# Patient Record
Sex: Male | Born: 1940 | Race: White | Hispanic: Yes | Marital: Single | Smoking: Former smoker
Health system: Southern US, Community
[De-identification: ages and names within clinical notes are randomized; demographics above are authoritative.]

## PROBLEM LIST (undated history)

## (undated) DIAGNOSIS — M1990 Unspecified osteoarthritis, unspecified site: Secondary | ICD-10-CM

## (undated) DIAGNOSIS — I712 Thoracic aortic aneurysm, without rupture: Secondary | ICD-10-CM

## (undated) DIAGNOSIS — I7121 Aneurysm of the ascending aorta, without rupture: Secondary | ICD-10-CM

## (undated) DIAGNOSIS — Z8719 Personal history of other diseases of the digestive system: Secondary | ICD-10-CM

## (undated) DIAGNOSIS — Z950 Presence of cardiac pacemaker: Secondary | ICD-10-CM

## (undated) DIAGNOSIS — I739 Peripheral vascular disease, unspecified: Secondary | ICD-10-CM

## (undated) DIAGNOSIS — I10 Essential (primary) hypertension: Secondary | ICD-10-CM

## (undated) DIAGNOSIS — I152 Hypertension secondary to endocrine disorders: Secondary | ICD-10-CM

## (undated) DIAGNOSIS — E1169 Type 2 diabetes mellitus with other specified complication: Secondary | ICD-10-CM

## (undated) DIAGNOSIS — I2541 Coronary artery aneurysm: Secondary | ICD-10-CM

## (undated) DIAGNOSIS — E119 Type 2 diabetes mellitus without complications: Secondary | ICD-10-CM

## (undated) DIAGNOSIS — H9190 Unspecified hearing loss, unspecified ear: Secondary | ICD-10-CM

## (undated) DIAGNOSIS — Z952 Presence of prosthetic heart valve: Secondary | ICD-10-CM

## (undated) DIAGNOSIS — I2510 Atherosclerotic heart disease of native coronary artery without angina pectoris: Secondary | ICD-10-CM

## (undated) DIAGNOSIS — E1159 Type 2 diabetes mellitus with other circulatory complications: Secondary | ICD-10-CM

## (undated) HISTORY — PX: STENT PLACE LEFT URETER (ARMC HX): HXRAD1254

## (undated) HISTORY — DX: Coronary artery aneurysm: I25.41

## (undated) HISTORY — PX: PACEMAKER GENERATOR CHANGE: SHX5998

## (undated) HISTORY — PX: OTHER SURGICAL HISTORY: SHX169

## (undated) HISTORY — PX: COLONOSCOPY: SHX174

## (undated) HISTORY — PX: REPLACEMENT TOTAL KNEE BILATERAL: SUR1225

## (undated) HISTORY — PX: GASTRIC BYPASS: SHX52

## (undated) HISTORY — PX: DG CHOLECYSTOGRAPHY GALL BLADDER (ARMC HX): HXRAD1480

## (undated) HISTORY — PX: CATARACT EXTRACTION: SUR2

---
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
## 2009-01-29 DIAGNOSIS — F32A Depression, unspecified: Secondary | ICD-10-CM | POA: Insufficient documentation

## 2009-01-29 DIAGNOSIS — F319 Bipolar disorder, unspecified: Secondary | ICD-10-CM | POA: Insufficient documentation

## 2009-01-29 DIAGNOSIS — I2510 Atherosclerotic heart disease of native coronary artery without angina pectoris: Secondary | ICD-10-CM | POA: Diagnosis present

## 2009-01-29 DIAGNOSIS — H9190 Unspecified hearing loss, unspecified ear: Secondary | ICD-10-CM | POA: Insufficient documentation

## 2011-01-14 DIAGNOSIS — Z9861 Coronary angioplasty status: Secondary | ICD-10-CM | POA: Insufficient documentation

## 2013-06-09 DIAGNOSIS — M1710 Unilateral primary osteoarthritis, unspecified knee: Secondary | ICD-10-CM | POA: Insufficient documentation

## 2013-06-09 DIAGNOSIS — M179 Osteoarthritis of knee, unspecified: Secondary | ICD-10-CM | POA: Insufficient documentation

## 2014-01-18 DIAGNOSIS — R238 Other skin changes: Secondary | ICD-10-CM | POA: Insufficient documentation

## 2014-01-18 DIAGNOSIS — L0390 Cellulitis, unspecified: Secondary | ICD-10-CM | POA: Insufficient documentation

## 2014-03-22 DIAGNOSIS — G4700 Insomnia, unspecified: Secondary | ICD-10-CM | POA: Insufficient documentation

## 2014-03-23 DIAGNOSIS — K289 Gastrojejunal ulcer, unspecified as acute or chronic, without hemorrhage or perforation: Secondary | ICD-10-CM | POA: Insufficient documentation

## 2016-12-25 DIAGNOSIS — I4510 Unspecified right bundle-branch block: Secondary | ICD-10-CM | POA: Insufficient documentation

## 2017-09-21 DIAGNOSIS — K8000 Calculus of gallbladder with acute cholecystitis without obstruction: Secondary | ICD-10-CM | POA: Insufficient documentation

## 2018-04-25 DIAGNOSIS — I459 Conduction disorder, unspecified: Secondary | ICD-10-CM | POA: Insufficient documentation

## 2018-05-30 DIAGNOSIS — G4733 Obstructive sleep apnea (adult) (pediatric): Secondary | ICD-10-CM | POA: Insufficient documentation

## 2018-05-30 DIAGNOSIS — Z952 Presence of prosthetic heart valve: Secondary | ICD-10-CM | POA: Insufficient documentation

## 2018-08-24 DIAGNOSIS — R42 Dizziness and giddiness: Secondary | ICD-10-CM | POA: Insufficient documentation

## 2018-08-24 DIAGNOSIS — H9313 Tinnitus, bilateral: Secondary | ICD-10-CM | POA: Insufficient documentation

## 2018-08-24 DIAGNOSIS — J343 Hypertrophy of nasal turbinates: Secondary | ICD-10-CM | POA: Insufficient documentation

## 2018-08-24 DIAGNOSIS — J351 Hypertrophy of tonsils: Secondary | ICD-10-CM | POA: Insufficient documentation

## 2018-09-07 DIAGNOSIS — I350 Nonrheumatic aortic (valve) stenosis: Secondary | ICD-10-CM | POA: Insufficient documentation

## 2019-01-12 DIAGNOSIS — H9192 Unspecified hearing loss, left ear: Secondary | ICD-10-CM | POA: Insufficient documentation

## 2019-01-12 DIAGNOSIS — IMO0001 Reserved for inherently not codable concepts without codable children: Secondary | ICD-10-CM | POA: Insufficient documentation

## 2019-04-07 DIAGNOSIS — H3500 Unspecified background retinopathy: Secondary | ICD-10-CM | POA: Insufficient documentation

## 2019-04-07 DIAGNOSIS — E669 Obesity, unspecified: Secondary | ICD-10-CM | POA: Insufficient documentation

## 2019-12-09 DIAGNOSIS — R339 Retention of urine, unspecified: Secondary | ICD-10-CM | POA: Insufficient documentation

## 2019-12-09 DIAGNOSIS — M545 Low back pain, unspecified: Secondary | ICD-10-CM | POA: Insufficient documentation

## 2019-12-09 DIAGNOSIS — M940 Chondrocostal junction syndrome [Tietze]: Secondary | ICD-10-CM | POA: Insufficient documentation

## 2019-12-09 DIAGNOSIS — G8929 Other chronic pain: Secondary | ICD-10-CM | POA: Insufficient documentation

## 2019-12-10 DIAGNOSIS — M5136 Other intervertebral disc degeneration, lumbar region: Secondary | ICD-10-CM | POA: Insufficient documentation

## 2020-01-25 DIAGNOSIS — S72114A Nondisplaced fracture of greater trochanter of right femur, initial encounter for closed fracture: Secondary | ICD-10-CM | POA: Insufficient documentation

## 2020-09-06 DIAGNOSIS — I350 Nonrheumatic aortic (valve) stenosis: Secondary | ICD-10-CM

## 2020-09-17 ENCOUNTER — Telehealth: Payer: Self-pay | Admitting: Cardiology

## 2020-09-17 NOTE — Telephone Encounter (Signed)
Patient saw Dr. Agustin Cree in [facility] and is scheduled 11/19/2020.

## 2020-10-07 ENCOUNTER — Encounter: Payer: Self-pay | Admitting: *Deleted

## 2020-10-07 ENCOUNTER — Emergency Department
Admission: EM | Admit: 2020-10-07 | Discharge: 2020-10-07 | Disposition: A | Discharge status: Home / Self Care | Payer: Medicare (Managed Care) | Attending: Student in an Organized Health Care Education/Training Program | Admitting: Student in an Organized Health Care Education/Training Program

## 2020-10-07 ENCOUNTER — Emergency Department: Payer: Medicare (Managed Care)

## 2020-10-07 ENCOUNTER — Other Ambulatory Visit: Payer: Self-pay

## 2020-10-07 DIAGNOSIS — S3992XA Unspecified injury of lower back, initial encounter: Secondary | ICD-10-CM | POA: Diagnosis present

## 2020-10-07 DIAGNOSIS — W108XXA Fall (on) (from) other stairs and steps, initial encounter: Secondary | ICD-10-CM | POA: Insufficient documentation

## 2020-10-07 DIAGNOSIS — S39012A Strain of muscle, fascia and tendon of lower back, initial encounter: Secondary | ICD-10-CM | POA: Insufficient documentation

## 2020-10-07 DIAGNOSIS — M5136 Other intervertebral disc degeneration, lumbar region: Secondary | ICD-10-CM | POA: Diagnosis not present

## 2020-10-07 DIAGNOSIS — Z87891 Personal history of nicotine dependence: Secondary | ICD-10-CM | POA: Insufficient documentation

## 2020-10-07 DIAGNOSIS — M5134 Other intervertebral disc degeneration, thoracic region: Secondary | ICD-10-CM | POA: Insufficient documentation

## 2020-10-07 MED ORDER — TRAMADOL HCL 50 MG PO TABS
50.0000 mg | ORAL_TABLET | Freq: Once | ORAL | Status: AC
  Administered 2020-10-07: 50 mg via ORAL
  Filled 2020-10-07: qty 1

## 2020-10-07 MED ORDER — METHOCARBAMOL 500 MG PO TABS
500.0000 mg | ORAL_TABLET | Freq: Once | ORAL | Status: AC
  Administered 2020-10-07: 500 mg via ORAL
  Filled 2020-10-07: qty 1

## 2020-10-07 MED ORDER — PREDNISONE 20 MG PO TABS
60.0000 mg | ORAL_TABLET | Freq: Once | ORAL | Status: AC
  Administered 2020-10-07: 60 mg via ORAL
  Filled 2020-10-07: qty 3

## 2020-10-07 MED ORDER — METHOCARBAMOL 500 MG PO TABS: 500.0000 mg | ORAL_TABLET | Freq: Four times a day (QID) | ORAL | 0 refills | Status: DC

## 2020-10-07 MED ORDER — PREDNISONE 50 MG PO TABS: 50.0000 mg | ORAL_TABLET | Freq: Every day | ORAL | 0 refills | Status: DC

## 2020-10-07 MED ORDER — TRAMADOL HCL 50 MG PO TABS: 50.0000 mg | ORAL_TABLET | Freq: Four times a day (QID) | ORAL | 0 refills | Status: DC | PRN

## 2020-10-07 NOTE — ED Triage Notes (Signed)
Pt to triage via walker.  Pt fell 2 weeks ago down steps and has back pain.  Pt was seen in an er and continues to have back pain.  Pt requesting pain meds.   Pt just got insurance and wants to be checked again.  Pt alert  Speech clear.

## 2020-10-07 NOTE — ED Notes (Signed)
This RN inquired about patient having a ride home after discharge after pain medication administration. Patient states he will not be driving home upon discharge. Will have someone else drive him home.

## 2020-10-07 NOTE — ED Notes (Signed)
See triage note. Pt to room in wheelchair. Pt stating he fell x2 wks ago and has been having uncontrolled back pain. Pt seen in another ER but was not prescribed any pain medication. Pt states he takes pain medicine at home but wasn't sure the name

## 2020-10-07 NOTE — ED Provider Notes (Signed)
[facility] Emergency Department Provider Note  ____________________________________________  Time seen: Approximately 7:02 PM  I have reviewed the triage vital signs and the nursing notes.   HISTORY  Chief Complaint Back Pain    HPI Nathan Singleton is a 79 y.o. male who presents the emergency department complaining of mid and lower back pain.  Patient states that he had a fall several weeks ago and injured his back.  Patient is from [place], recently moved to the area.  He states that he was evaluated at emergency department in [place].  Patient states that he had reassuring imaging at the time of injury but with his ongoing pain he wants to be reassessed.  He has had no bowel or bladder dysfunction, saddle anesthesia or paresthesias.  No radicular symptoms.  Just ongoing mid and lower back pain.  Patient denies any other complaints at this time.         No past medical history on file.  There are no problems to display for this patient.     Prior to Admission medications   Medication Sig Start Date End Date Taking? Authorizing Provider  methocarbamol (ROBAXIN) 500 MG tablet Take 1 tablet (500 mg total) by mouth 4 (four) times daily. 10/07/20   Alarik Radu, Charline Bills, PA-C  predniSONE (DELTASONE) 50 MG tablet Take 1 tablet (50 mg total) by mouth daily with breakfast. 10/07/20   Gabryella Murfin, Charline Bills, PA-C  traMADol (ULTRAM) 50 MG tablet Take 1 tablet (50 mg total) by mouth every 6 (six) hours as needed. 10/07/20   Novalie Leamy, Charline Bills, PA-C    Allergies Patient has no allergy information on record.  No family history on file.  Social History Social History   Tobacco Use  . Smoking status: Former Research scientist (life sciences)  . Smokeless tobacco: Never Used  Substance Use Topics  . Alcohol use: Not Currently  . Drug use: Not Currently     Review of Systems  Constitutional: No fever/chills Eyes: No visual changes. No discharge ENT: No upper respiratory  complaints. Cardiovascular: no chest pain. Respiratory: no cough. No SOB. Gastrointestinal: No abdominal pain.  No nausea, no vomiting.  No diarrhea.  No constipation. Genitourinary: Negative for dysuria. No hematuria Musculoskeletal: Positive for mid and lower back pain Skin: Negative for rash, abrasions, lacerations, ecchymosis. Neurological: Negative for headaches, focal weakness or numbness.  10 System ROS otherwise negative.  ____________________________________________   PHYSICAL EXAM:  VITAL SIGNS: ED Triage Vitals  Enc Vitals Group     BP 10/07/20 1641 (!) 146/56     Pulse Rate 10/07/20 1641 63     Resp 10/07/20 1641 18     Temp 10/07/20 1641 99.2 F (37.3 C)     Temp Source 10/07/20 1641 Oral     SpO2 10/07/20 1641 99 %     Weight 10/07/20 1638 250 lb (113.4 kg)     Height 10/07/20 1638 5\' 11"  (1.803 m)     Head Circumference --      Peak Flow --      Pain Score 10/07/20 1638 8     Pain Loc --      Pain Edu? --      Excl. in [place]? --      Constitutional: Alert and oriented. Well appearing and in no acute distress. Eyes: Conjunctivae are normal. PERRL. EOMI. Head: Atraumatic. ENT:      Ears:       Nose: No congestion/rhinnorhea.      Mouth/Throat: Mucous membranes  are moist.  Neck: No stridor.  No cervical spine tenderness to palpation.  Cardiovascular: Normal rate, regular rhythm. Normal S1 and S2.  Good peripheral circulation. Respiratory: Normal respiratory effort without tachypnea or retractions. Lungs CTAB. Good air entry to the bases with no decreased or absent breath sounds. Gastrointestinal: Bowel sounds 4 quadrants. Soft and nontender to palpation. No guarding or rigidity. No palpable masses. No distention. No CVA tenderness. Musculoskeletal: Full range of motion to all extremities. No gross deformities appreciated.  Visualization of the thoracic and lumbar spine reveals no visible signs of trauma.  Patient is mildly tender to palpation over the lower  thoracic and upper lumbar spines in the region of T10-L2.  No palpable abnormality or step-off.  Patient is also having associated paraspinal muscle tenderness in this region.  This paraspinal muscle tenderness extends from the mid thoracic through the lower lumbar region.  Palpable spasm on the left side is appreciated.  No spasm on the right.  No extension of tenderness into the SI joint or sciatic notch.  Negative straight leg raise bilaterally.  Patient is ambulating without difficulty.  Dorsalis pedis pulse and sensation intact distally. Neurologic:  Normal speech and language. No gross focal neurologic deficits are appreciated.  Skin:  Skin is warm, dry and intact. No rash noted. Psychiatric: Mood and affect are normal. Speech and behavior are normal. Patient exhibits appropriate insight and judgement.   ____________________________________________   LABS (all labs ordered are listed, but only abnormal results are displayed)  Labs Reviewed - No data to display ____________________________________________  EKG   ____________________________________________  RADIOLOGY I personally viewed and evaluated these images as part of my medical decision making, as well as reviewing the written report by the radiologist.  ED Provider Interpretation: Visualization of thoracic and lumbar films reveals severe degenerative disc disease throughout the thoracic and lumbar spine.  No evidence of compression fracture.  No previous imaging for comparison.  DG Thoracic Spine 2 View  Result Date: 10/07/2020 CLINICAL DATA:  Status post fall. EXAM: THORACIC SPINE 2 VIEWS COMPARISON:  None. FINDINGS: There is no evidence of an acute thoracic spine fracture. Alignment is normal. There is moderate severity multilevel endplate sclerosis with mild to moderate severity multilevel intervertebral disc space narrowing. No other significant bone abnormalities are identified. 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 IMPRESSION: 1. Moderate severity multilevel degenerative disc disease. Electronically Signed   By: Virgina Norfolk M.D.   On: 10/07/2020 19:02   DG Lumbar Spine 2-3 Views  Result Date: 10/07/2020 CLINICAL DATA:  Status post recent fall. EXAM: LUMBAR SPINE - 2-3 VIEW COMPARISON:  September 06, 2020 FINDINGS: There is no evidence of an acute lumbar spine fracture. Alignment is normal. There is moderate to marked severity endplate sclerosis and anterior osteophyte formation. Mild multilevel intervertebral disc space narrowing is seen. Radiopaque surgical clips are seen within the bilateral upper quadrants. IMPRESSION: 1. Moderate to marked severity multilevel degenerative disc disease. Electronically Signed   By: Virgina Norfolk M.D.   On: 10/07/2020 19:04    ____________________________________________    PROCEDURES  Procedure(s) performed:    Procedures    Medications  traMADol (ULTRAM) tablet 50 mg (50 mg Oral Given 10/07/20 1953)  predniSONE (DELTASONE) tablet 60 mg (60 mg Oral Given 10/07/20 1952)  methocarbamol (ROBAXIN) tablet 500 mg (500 mg Oral Given 10/07/20 1953)     ____________________________________________   INITIAL IMPRESSION / ASSESSMENT AND PLAN / ED COURSE  Pertinent labs & imaging results that were available during my care of the patient were reviewed by me and considered in my medical decision making (see chart for details).  Review of the [place] CSRS was performed in accordance of the [place] prior to dispensing any controlled drugs.           Patient's diagnosis is consistent with lumbar strain, degenerative disc disease of the lumbar and thoracic spine.  Patient had a fall several weeks ago and has had ongoing back pain.  Patient been evaluated in the emergency department initially in [place].  Unable to identify any records in epic.  There was no previous imaging for comparison.  Patient had  ongoing pain.  Diffuse tenderness in the thoracic and lumbar region with appreciable muscle spasms on exam.  No concerning neuro deficits on exam.  Differential included compression fracture, ruptured disc, contusion, strain.  Imaging was reassuring with no acute findings concerning for compression fractures.  Diffuse degenerative disc disease.  At this time no indication for further imaging with CT or MRI.Marland Kitchen  Patient's fianc states that the patient had been having back issues even before this fall.  He had seen neurosurgery regularly in [place] for epidural steroid injections.  I will refer the patient to neurosurgery for ongoing management of chronic issues.  Return precautions discussed with the patient and the fianc.  Patient is given ED precautions to return to the ED for any worsening or new symptoms.     ____________________________________________  FINAL CLINICAL IMPRESSION(S) / ED DIAGNOSES  Final diagnoses:  Strain of lumbar region, initial encounter  Degenerative disc disease, lumbar  DDD (degenerative disc disease), thoracic      NEW MEDICATIONS STARTED DURING THIS VISIT:  ED Discharge Orders         Ordered    methocarbamol (ROBAXIN) 500 MG tablet  4 times daily        10/07/20 1950    predniSONE (DELTASONE) 50 MG tablet  Daily with breakfast        10/07/20 1950    traMADol (ULTRAM) 50 MG tablet  Every 6 hours PRN        10/07/20 1951              This chart was dictated using voice recognition software/Dragon. Despite best efforts to proofread, errors can occur which can change the meaning. Any change was purely unintentional.    Darletta Moll, PA-C 10/07/20 1956    Merlyn Lot, MD 10/07/20 2027

## 2020-10-13 ENCOUNTER — Emergency Department
Admission: EM | Admit: 2020-10-13 | Discharge: 2020-10-13 | Disposition: A | Discharge status: Home / Self Care | Payer: Medicare (Managed Care) | Attending: Emergency Medicine | Admitting: Emergency Medicine

## 2020-10-13 ENCOUNTER — Emergency Department: Payer: Medicare (Managed Care)

## 2020-10-13 DIAGNOSIS — Z87891 Personal history of nicotine dependence: Secondary | ICD-10-CM | POA: Diagnosis not present

## 2020-10-13 DIAGNOSIS — E1165 Type 2 diabetes mellitus with hyperglycemia: Secondary | ICD-10-CM | POA: Insufficient documentation

## 2020-10-13 DIAGNOSIS — R531 Weakness: Secondary | ICD-10-CM | POA: Diagnosis not present

## 2020-10-13 DIAGNOSIS — I10 Essential (primary) hypertension: Secondary | ICD-10-CM | POA: Insufficient documentation

## 2020-10-13 DIAGNOSIS — R739 Hyperglycemia, unspecified: Secondary | ICD-10-CM

## 2020-10-13 HISTORY — DX: Type 2 diabetes mellitus without complications: E11.9

## 2020-10-13 HISTORY — DX: Unspecified osteoarthritis, unspecified site: M19.90

## 2020-10-13 LAB — URINALYSIS, COMPLETE (UACMP) WITH MICROSCOPIC
Bacteria, UA: NONE SEEN
Bilirubin Urine: NEGATIVE
Glucose, UA: >=500 mg/dL — AB
Hgb urine dipstick: NEGATIVE
Ketones, ur: NEGATIVE mg/dL
Leukocytes,Ua: NEGATIVE
Nitrite: NEGATIVE
Protein, ur: NEGATIVE mg/dL
Specific Gravity, Urine: 1.007 (ref 1.005–1.030)
Squamous Epithelial / HPF: NONE SEEN (ref 0–5)
pH: 5 (ref 5.0–8.0)

## 2020-10-13 LAB — CBC WITH DIFFERENTIAL/PLATELET
Abs Immature Granulocytes: 0.01 10*3/uL (ref 0.00–0.07)
Basophils Absolute: 0 10*3/uL (ref 0.0–0.1)
Basophils Relative: 1 %
Eosinophils Absolute: 0.2 10*3/uL (ref 0.0–0.5)
Eosinophils Relative: 4 %
HCT: 33 % — ABNORMAL LOW (ref 39.0–52.0)
Hemoglobin: 11.3 g/dL — ABNORMAL LOW (ref 13.0–17.0)
Immature Granulocytes: 0 %
Lymphocytes Relative: 27 %
Lymphs Abs: 1.5 10*3/uL (ref 0.7–4.0)
MCH: 32.8 pg (ref 26.0–34.0)
MCHC: 34.2 g/dL (ref 30.0–36.0)
MCV: 95.7 fL (ref 80.0–100.0)
Monocytes Absolute: 0.3 10*3/uL (ref 0.1–1.0)
Monocytes Relative: 6 %
Neutro Abs: 3.4 10*3/uL (ref 1.7–7.7)
Neutrophils Relative %: 62 %
Platelets: 114 10*3/uL — ABNORMAL LOW (ref 150–400)
RBC: 3.45 MIL/uL — ABNORMAL LOW (ref 4.22–5.81)
RDW: 13.2 % (ref 11.5–15.5)
WBC: 5.5 10*3/uL (ref 4.0–10.5)
nRBC: 0 % (ref 0.0–0.2)

## 2020-10-13 LAB — COMPREHENSIVE METABOLIC PANEL
ALT: 46 U/L — ABNORMAL HIGH (ref 0–44)
AST: 36 U/L (ref 15–41)
Albumin: 4.3 g/dL (ref 3.5–5.0)
Alkaline Phosphatase: 125 U/L (ref 38–126)
Anion gap: 11 (ref 5–15)
BUN: 56 mg/dL — ABNORMAL HIGH (ref 8–23)
CO2: 23 mmol/L (ref 22–32)
Calcium: 8.5 mg/dL — ABNORMAL LOW (ref 8.9–10.3)
Chloride: 102 mmol/L (ref 98–111)
Creatinine, Ser: 1.62 mg/dL — ABNORMAL HIGH (ref 0.61–1.24)
GFR, Estimated: 43 mL/min — ABNORMAL LOW (ref >60)
Glucose, Bld: 334 mg/dL — ABNORMAL HIGH (ref 70–99)
Potassium: 4.4 mmol/L (ref 3.5–5.1)
Sodium: 136 mmol/L (ref 135–145)
Total Bilirubin: 0.8 mg/dL (ref 0.3–1.2)
Total Protein: 7.3 g/dL (ref 6.5–8.1)

## 2020-10-13 LAB — CBG MONITORING, ED
Glucose-Capillary: 328 mg/dL — ABNORMAL HIGH (ref 70–99)
Glucose-Capillary: 237 mg/dL — ABNORMAL HIGH (ref 70–99)

## 2020-10-13 MED ORDER — LACTATED RINGERS IV BOLUS
1000.0000 mL | Freq: Once | INTRAVENOUS | Status: AC
  Administered 2020-10-13: 1000 mL via INTRAVENOUS

## 2020-10-13 MED ORDER — LIDOCAINE 5 % EX PTCH: 1.0000 | MEDICATED_PATCH | Freq: Two times a day (BID) | CUTANEOUS | 0 refills | Status: DC

## 2020-10-13 NOTE — ED Provider Notes (Signed)
[facility] Emergency Department Provider Note   ____________________________________________   First MD Initiated Contact with Patient 10/13/20 1914     (approximate)  I have reviewed the triage vital signs and the nursing notes.   HISTORY  Chief Complaint Hyperglycemia and Weakness    HPI Nathan Singleton is a 79 y.o. male with past medical history of hypertension, diabetes, and arthritis who presents to the ED complaining of hyperglycemia and weakness.  Patient states he was recently seen in the ED for back pain following fall, found to have multilevel degenerative disease at that time with no acute injury.  He was started on prednisone for his back pain and since then his blood sugars have been running high.  He also states that he has been feeling weaker than usual with generalized malaise.  He denies any fevers, cough, chest pain, shortness of breath, abdominal pain, nausea, vomiting, dysuria, or hematuria.  He has had a couple days of diarrhea but denies any blood in his stool.  He does state that he was previously treated for an ulcer while in [place].        Past Medical History:  Diagnosis Date  . Arthritis   . Diabetes mellitus without complication ([place])   . Hypertension     There are no problems to display for this patient.   History reviewed. No pertinent surgical history.  Prior to Admission medications   Medication Sig Start Date End Date Taking? Authorizing Provider  lidocaine (LIDODERM) 5 % Place 1 patch onto the skin every 12 (twelve) hours. Remove & Discard patch within 12 hours or as directed by MD 10/13/20 10/13/21  Blake Divine, MD  methocarbamol (ROBAXIN) 500 MG tablet Take 1 tablet (500 mg total) by mouth 4 (four) times daily. 10/07/20   Cuthriell, Charline Bills, PA-C  predniSONE (DELTASONE) 50 MG tablet Take 1 tablet (50 mg total) by mouth daily with breakfast. 10/07/20   Cuthriell, Charline Bills, PA-C  traMADol (ULTRAM)  50 MG tablet Take 1 tablet (50 mg total) by mouth every 6 (six) hours as needed. 10/07/20   Cuthriell, Charline Bills, PA-C    Allergies Patient has no allergy information on record.  No family history on file.  Social History Social History   Tobacco Use  . Smoking status: Former Research scientist (life sciences)  . Smokeless tobacco: Never Used  Substance Use Topics  . Alcohol use: Not Currently  . Drug use: Not Currently    Review of Systems  Constitutional: No fever/chills.  Positive for generalized weakness and malaise. Eyes: No visual changes. ENT: No sore throat. Cardiovascular: Denies chest pain. Respiratory: Denies shortness of breath. Gastrointestinal: No abdominal pain.  No nausea, no vomiting.  No diarrhea.  No constipation. Genitourinary: Negative for dysuria. Musculoskeletal: Positive for back pain. Skin: Negative for rash. Neurological: Negative for headaches, focal weakness or numbness.  ____________________________________________   PHYSICAL EXAM:  VITAL SIGNS: ED Triage Vitals  Enc Vitals Group     BP      Pulse      Resp      Temp      Temp src      SpO2      Weight      Height      Head Circumference      Peak Flow      Pain Score      Pain Loc      Pain Edu?      Excl. in [place]?  Constitutional: Alert and oriented. Eyes: Conjunctivae are normal. Head: Atraumatic. Nose: No congestion/rhinnorhea. Mouth/Throat: Mucous membranes are dry. Neck: Normal ROM Cardiovascular: Normal rate, regular rhythm. Grossly normal heart sounds. Respiratory: Normal respiratory effort.  No retractions. Lungs CTAB. Gastrointestinal: Soft and nontender. No distention. Genitourinary: deferred Musculoskeletal: No lower extremity tenderness nor edema. Neurologic:  Normal speech and language. No gross focal neurologic deficits are appreciated. Skin:  Skin is warm, dry and intact. No rash noted. Psychiatric: Mood and affect are normal. Speech and behavior are  normal.  ____________________________________________   LABS (all labs ordered are listed, but only abnormal results are displayed)  Labs Reviewed  CBC WITH DIFFERENTIAL/PLATELET - Abnormal; Notable for the following components:      Result Value   RBC 3.45 (*)    Hemoglobin 11.3 (*)    HCT 33.0 (*)    Platelets 114 (*)    All other components within normal limits  COMPREHENSIVE METABOLIC PANEL - Abnormal; Notable for the following components:   Glucose, Bld 334 (*)    BUN 56 (*)    Creatinine, Ser 1.62 (*)    Calcium 8.5 (*)    ALT 46 (*)    GFR, Estimated 43 (*)    All other components within normal limits  URINALYSIS, COMPLETE (UACMP) WITH MICROSCOPIC - Abnormal; Notable for the following components:   Color, Urine STRAW (*)    APPearance CLEAR (*)    Glucose, UA >=500 (*)    All other components within normal limits  CBG MONITORING, ED - Abnormal; Notable for the following components:   Glucose-Capillary 328 (*)    All other components within normal limits   ____________________________________________  EKG  ED ECG REPORT I, Blake Divine, the attending physician, personally viewed and interpreted this ECG.   Date: 10/13/2020  EKG Time: 19:41  Rate: 60  Rhythm: normal sinus rhythm  Axis: Normal  Intervals:right bundle branch block  ST&T Change: None   PROCEDURES  Procedure(s) performed (including Critical Care):  Procedures   ____________________________________________   INITIAL IMPRESSION / ASSESSMENT AND PLAN / ED COURSE       79 year old male with past medical history of hypertension, diabetes, peptic ulcer disease, and arthritis who presents to the ED complaining of generalized weakness and malaise after starting on prednisone for back pain 6 days ago.  Blood glucose noted to be greater than 400 with EMS, we will hydrate patient with IV fluids and check electrolytes for DKA.  He is overall well-appearing with no focal complaints.  We will screen  chest x-ray and UA for evidence of infection, also check EKG.  Patient would likely benefit from stopping steroids for his back pain in favor of Lidoderm patch and Tylenol.  Chest x-ray reviewed by me and shows no infiltrate, edema, or effusion, UA also with no evidence of infection.  EKG without evidence of arrhythmia or ischemia.  Patient reports feeling better following IV fluids and he is appropriate for discharge home with PCP follow-up.  He states he has an appointment scheduled with a new PCP in 10 days, he was also advised to stop taking steroids.  He was counseled to return to the ED for new worsening symptoms, patient agrees with plan.      ____________________________________________   FINAL CLINICAL IMPRESSION(S) / ED DIAGNOSES  Final diagnoses:  Hyperglycemia     ED Discharge Orders         Ordered    lidocaine (LIDODERM) 5 %  Every 12 hours  10/13/20 2302           Note:  This document was prepared using Dragon voice recognition software and may include unintentional dictation errors.   Blake Divine, MD 10/13/20 [phone]

## 2020-10-13 NOTE — ED Triage Notes (Signed)
Pt endorsing weakness for a few days. Pt was recently seen for back pain and was given prednisone. Pt endorsing diarrhea for a few days.

## 2020-10-13 NOTE — ED Notes (Addendum)
Pt given a diet ginger ale 

## 2020-11-11 ENCOUNTER — Encounter: Payer: Self-pay | Admitting: Family Medicine

## 2020-11-15 ENCOUNTER — Other Ambulatory Visit: Payer: Self-pay

## 2020-11-15 ENCOUNTER — Emergency Department: Payer: Medicare Other

## 2020-11-15 DIAGNOSIS — I10 Essential (primary) hypertension: Secondary | ICD-10-CM | POA: Insufficient documentation

## 2020-11-15 DIAGNOSIS — I712 Thoracic aortic aneurysm, without rupture: Secondary | ICD-10-CM | POA: Insufficient documentation

## 2020-11-15 DIAGNOSIS — S2224XA Fracture of xiphoid process, initial encounter for closed fracture: Secondary | ICD-10-CM | POA: Insufficient documentation

## 2020-11-15 DIAGNOSIS — R072 Precordial pain: Secondary | ICD-10-CM | POA: Diagnosis present

## 2020-11-15 DIAGNOSIS — Y92009 Unspecified place in unspecified non-institutional (private) residence as the place of occurrence of the external cause: Secondary | ICD-10-CM | POA: Diagnosis not present

## 2020-11-15 DIAGNOSIS — S20219A Contusion of unspecified front wall of thorax, initial encounter: Secondary | ICD-10-CM | POA: Diagnosis not present

## 2020-11-15 DIAGNOSIS — E119 Type 2 diabetes mellitus without complications: Secondary | ICD-10-CM | POA: Diagnosis not present

## 2020-11-15 DIAGNOSIS — R590 Localized enlarged lymph nodes: Secondary | ICD-10-CM | POA: Diagnosis not present

## 2020-11-15 DIAGNOSIS — Z87891 Personal history of nicotine dependence: Secondary | ICD-10-CM | POA: Insufficient documentation

## 2020-11-15 DIAGNOSIS — W01118A Fall on same level from slipping, tripping and stumbling with subsequent striking against other sharp object, initial encounter: Secondary | ICD-10-CM | POA: Diagnosis not present

## 2020-11-15 LAB — BASIC METABOLIC PANEL
Anion gap: 13 (ref 5–15)
BUN: 61 mg/dL — ABNORMAL HIGH (ref 8–23)
CO2: 23 mmol/L (ref 22–32)
Calcium: 8.7 mg/dL — ABNORMAL LOW (ref 8.9–10.3)
Chloride: 103 mmol/L (ref 98–111)
Creatinine, Ser: 1.48 mg/dL — ABNORMAL HIGH (ref 0.61–1.24)
GFR, Estimated: 48 mL/min — ABNORMAL LOW (ref >60)
Glucose, Bld: 121 mg/dL — ABNORMAL HIGH (ref 70–99)
Potassium: 4.8 mmol/L (ref 3.5–5.1)
Sodium: 139 mmol/L (ref 135–145)

## 2020-11-15 LAB — CBC
HCT: 32.4 % — ABNORMAL LOW (ref 39.0–52.0)
Hemoglobin: 11 g/dL — ABNORMAL LOW (ref 13.0–17.0)
MCH: 32.5 pg (ref 26.0–34.0)
MCHC: 34 g/dL (ref 30.0–36.0)
MCV: 95.9 fL (ref 80.0–100.0)
Platelets: 106 10*3/uL — ABNORMAL LOW (ref 150–400)
RBC: 3.38 MIL/uL — ABNORMAL LOW (ref 4.22–5.81)
RDW: 13.2 % (ref 11.5–15.5)
WBC: 3.6 10*3/uL — ABNORMAL LOW (ref 4.0–10.5)
nRBC: 0 % (ref 0.0–0.2)

## 2020-11-15 LAB — TROPONIN I (HIGH SENSITIVITY): Troponin I (High Sensitivity): 13 ng/L (ref <18)

## 2020-11-15 NOTE — ED Notes (Signed)
Pt to ct scan.

## 2020-11-15 NOTE — ED Notes (Signed)
Per ems pt fell hit his sternum on a washing machine. Per ems pt with indentation to chest noted. Vitals stable, no shob, no ekg obtained by ems.

## 2020-11-15 NOTE — ED Triage Notes (Addendum)
Pt to ED via [facility] from home. Pt stating he sustained a fall and hit the corner of the dryer on his central chest region. CP is central and radiates to left side. Pt stating he heard a pop when he hit the dryer. Pt denies blood thinner use.

## 2020-11-16 ENCOUNTER — Emergency Department
Admission: EM | Admit: 2020-11-16 | Discharge: 2020-11-16 | Disposition: A | Discharge status: Home / Self Care | Payer: Medicare Other | Attending: Emergency Medicine | Admitting: Emergency Medicine

## 2020-11-16 DIAGNOSIS — I712 Thoracic aortic aneurysm, without rupture, unspecified: Secondary | ICD-10-CM

## 2020-11-16 DIAGNOSIS — S20219A Contusion of unspecified front wall of thorax, initial encounter: Secondary | ICD-10-CM

## 2020-11-16 DIAGNOSIS — R590 Localized enlarged lymph nodes: Secondary | ICD-10-CM

## 2020-11-16 DIAGNOSIS — S2224XA Fracture of xiphoid process, initial encounter for closed fracture: Secondary | ICD-10-CM

## 2020-11-16 LAB — TROPONIN I (HIGH SENSITIVITY): Troponin I (High Sensitivity): 13 ng/L (ref <18)

## 2020-11-16 MED ORDER — HYDROCODONE-ACETAMINOPHEN 5-325 MG PO TABS
1.0000 | ORAL_TABLET | Freq: Four times a day (QID) | ORAL | 0 refills | Status: DC | PRN
Start: 2020-11-16 — End: 2020-11-22

## 2020-11-16 MED ORDER — HYDROCODONE-ACETAMINOPHEN 5-325 MG PO TABS
1.0000 | ORAL_TABLET | Freq: Once | ORAL | Status: AC
  Administered 2020-11-16: 1 via ORAL
  Filled 2020-11-16: qty 1

## 2020-11-16 NOTE — ED Notes (Signed)
Patient attempting to call ride.

## 2020-11-16 NOTE — ED Provider Notes (Signed)
[facility] Emergency Department Provider Note   ____________________________________________   First MD Initiated Contact with Patient 11/16/20 0425     (approximate)  I have reviewed the triage vital signs and the nursing notes.   HISTORY  Chief Complaint Chest Pain and Fall    HPI Nathan Singleton is a 79 y.o. male brought to the ED via EMS from home status post mechanical fall with chest pain.  Patient tripped and fell and hit the corner of the dryer with his chest.  Reports substernal chest pain.  States he heard a "pop" when he hit the dryer.  Did not strike head or suffer LOC.  Denies anticoagulant use.  Denies cough, shortness of breath, abdominal pain, nausea, vomiting or dizziness.     Past Medical History:  Diagnosis Date  . Arthritis   . Diabetes mellitus without complication ([place])   . Hypertension     There are no problems to display for this patient.   No past surgical history on file.  Prior to Admission medications   Medication Sig Start Date End Date Taking? Authorizing Provider  HYDROcodone-acetaminophen (NORCO) 5-325 MG tablet Take 1 tablet by mouth every 6 (six) hours as needed for moderate pain. 11/16/20   Paulette Blanch, MD  lidocaine (LIDODERM) 5 % Place 1 patch onto the skin every 12 (twelve) hours. Remove & Discard patch within 12 hours or as directed by MD 10/13/20 10/13/21  Blake Divine, MD  methocarbamol (ROBAXIN) 500 MG tablet Take 1 tablet (500 mg total) by mouth 4 (four) times daily. 10/07/20   Cuthriell, Charline Bills, PA-C  predniSONE (DELTASONE) 50 MG tablet Take 1 tablet (50 mg total) by mouth daily with breakfast. 10/07/20   Cuthriell, Charline Bills, PA-C  traMADol (ULTRAM) 50 MG tablet Take 1 tablet (50 mg total) by mouth every 6 (six) hours as needed. 10/07/20   Cuthriell, Charline Bills, PA-C    Allergies Patient has no allergy information on record.  No family history on file.  Social History Social History    Tobacco Use  . Smoking status: Former Research scientist (life sciences)  . Smokeless tobacco: Never Used  Substance Use Topics  . Alcohol use: Not Currently  . Drug use: Not Currently    Review of Systems  Constitutional: No fever/chills Eyes: No visual changes. ENT: No sore throat. Cardiovascular: Positive for chest pain. Respiratory: Denies shortness of breath. Gastrointestinal: No abdominal pain.  No nausea, no vomiting.  No diarrhea.  No constipation. Genitourinary: Negative for dysuria. Musculoskeletal: Negative for back pain. Skin: Negative for rash. Neurological: Negative for headaches, focal weakness or numbness.   ____________________________________________   PHYSICAL EXAM:  VITAL SIGNS: ED Triage Vitals  Enc Vitals Group     BP 11/15/20 2010 (!) 155/45     Pulse Rate 11/15/20 2009 68     Resp 11/15/20 2009 20     Temp 11/15/20 2009 98.3 F (36.8 C)     Temp Source 11/15/20 2009 Oral     SpO2 11/15/20 2009 100 %     Weight 11/15/20 2006 249 lb 1.9 oz (113 kg)     Height 11/15/20 2006 5\' 11"  (1.803 m)     Head Circumference --      Peak Flow --      Pain Score 11/15/20 2006 8     Pain Loc --      Pain Edu? --      Excl. in [place]? --     Constitutional: Alert and oriented.  Well appearing and in no acute distress.  Hard of hearing. Eyes: Conjunctivae are normal. PERRL. EOMI. Head: Atraumatic. Nose: Atraumatic. Mouth/Throat: Mucous membranes are moist.  No dental malocclusion.   Neck: No stridor.  No cervical spine tenderness to palpation. Cardiovascular: Normal rate, regular rhythm. Grossly normal heart sounds.  Good peripheral circulation. Respiratory: Normal respiratory effort.  No retractions. Lungs CTAB.  No crepitus.  No splinting.  Point tender to palpation xiphoid process.  Small contusion to right anterior chest Gastrointestinal: Soft and nontender to light or deep palpation. No distention. No abdominal bruits. No CVA tenderness. Musculoskeletal: No lower extremity  tenderness nor edema.  No joint effusions. Neurologic:  Normal speech and language. No gross focal neurologic deficits are appreciated. No gait instability. Skin:  Skin is warm, dry and intact. No rash noted. Psychiatric: Mood and affect are normal. Speech and behavior are normal.  ____________________________________________   LABS (all labs ordered are listed, but only abnormal results are displayed)  Labs Reviewed  BASIC METABOLIC PANEL - Abnormal; Notable for the following components:      Result Value   Glucose, Bld 121 (*)    BUN 61 (*)    Creatinine, Ser 1.48 (*)    Calcium 8.7 (*)    GFR, Estimated 48 (*)    All other components within normal limits  CBC - Abnormal; Notable for the following components:   WBC 3.6 (*)    RBC 3.38 (*)    Hemoglobin 11.0 (*)    HCT 32.4 (*)    Platelets 106 (*)    All other components within normal limits  TROPONIN I (HIGH SENSITIVITY)  TROPONIN I (HIGH SENSITIVITY)   ____________________________________________  EKG  ED ECG REPORT I, Neo Yepiz J, the attending physician, personally viewed and interpreted this ECG.   Date: 11/16/2020  EKG Time: 2005  Rate: 62  Rhythm: normal EKG, normal sinus rhythm  Axis: Normal  Intervals:right bundle branch block  ST&T Change: Nonspecific  ____________________________________________  RADIOLOGY I, Javier Gell J, personally viewed and evaluated these images (plain radiographs) as part of my medical decision making, as well as reviewing the written report by the radiologist.  ED MD interpretation: No acute, no pulmonary process on chest x-ray; nondisplaced xiphoid process fracture seen on CT scan, incidental supraclavicular lymphadenopathy and thoracic aneurysm  Official radiology report(s): DG Chest 2 View  Result Date: 11/15/2020 CLINICAL DATA:  Chest pain, fell EXAM: CHEST - 2 VIEW COMPARISON:  10/13/2020 FINDINGS: Frontal and lateral views of the chest demonstrate dual lead pacer  unchanged. Aortic valve prosthesis again identified. The cardiac silhouette is unremarkable. No airspace disease, effusion, or pneumothorax. There are no acute displaced fractures. IMPRESSION: 1. No acute intrathoracic process. Electronically Signed   By: Randa Ngo M.D.   On: 11/15/2020 22:47   CT Chest Wo Contrast  Result Date: 11/15/2020 CLINICAL DATA:  Acute pain due to trauma. Central chest pain. Pain radiates to the left side. EXAM: CT CHEST WITHOUT CONTRAST TECHNIQUE: Multidetector CT imaging of the chest was performed following the standard protocol without IV contrast. COMPARISON:  None. FINDINGS: Cardiovascular: Atherosclerotic changes are noted of the coronary arteries. The ascending thoracic aorta is borderline aneurysmal measuring approximately 4 cm. The patient is status post prior TAVR. The heart size is unremarkable. There are minimal atherosclerotic changes of the thoracic aorta. There is a left-sided pacemaker in place. Mediastinum/Nodes: -- No mediastinal lymphadenopathy. -- No hilar lymphadenopathy. -- No axillary lymphadenopathy. --findings are suspicious for right supraclavicular adenopathy with a dominant  lymph node measuring approximately 1.5 cm (axial series 2, image 14. This finding is suboptimally evaluated in the absence of IV contrast. -- Normal thyroid gland where visualized. -  Unremarkable esophagus. Lungs/Pleura: Airways are patent. No pleural effusion, lobar consolidation, pneumothorax or pulmonary infarction. Upper Abdomen: Multiple calcifications are noted throughout the patient's liver, likely related to a prior granulomatous infection. There are postsurgical changes of the stomach that appear to be related to prior gastric bypass. Musculoskeletal: There is a nondisplaced fracture of the xiphoid process (sagittal series 6, image 96). There are few subcutaneous nodules involving the low anterior chest which likely represent small soft tissue contusions. IMPRESSION: 1.  Nondisplaced fracture of the xiphoid process. 2. There are few subcutaneous nodules involving the low anterior chest which likely represent small soft tissue contusions. 3. There are findings suspicious for right supraclavicular adenopathy with a dominant lymph node measuring approximately 1.5 cm. This finding is suboptimally evaluated in the absence of IV contrast. Follow-up with a nonemergent outpatient ultrasound is recommended. 4. Borderline aneurysmal dilatation of the ascending thoracic aorta measuring approximately 4 cm. Recommend annual imaging followup by CTA or MRA. This recommendation follows 2010 ACCF/AHA/AATS/ACR/ASA/SCA/SCAI/SIR/STS/SVM Guidelines for the Diagnosis and Management of Patients with Thoracic Aortic Disease. Circulation. 2010; 121: Y637-C588. Aortic aneurysm NOS (ICD10-I71.9) Aortic Atherosclerosis (ICD10-I70.0). Electronically Signed   By: Constance Holster M.D.   On: 11/15/2020 23:55    ____________________________________________   PROCEDURES  Procedure(s) performed (including Critical Care):  Procedures   ____________________________________________   INITIAL IMPRESSION / ASSESSMENT AND PLAN / ED COURSE  As part of my medical decision making, I reviewed the following data within the [facility] notes reviewed and incorporated, Labs reviewed, EKG interpreted, Old chart reviewed (minor care visits), Radiograph reviewed, Notes from prior ED visits (09/2020 visits for back strain and hyperglycemia) and [place] Controlled Substance Database     79 year old male presenting with chest pain/contusion status post mechanical fall and striking his chest Differential diagnosis includes, but is not limited to, ACS, aortic dissection, pulmonary embolism, cardiac tamponade, pneumothorax, pneumonia, pericarditis, myocarditis, GI-related causes including esophagitis/gastritis, and musculoskeletal chest wall pain.    Laboratory results including 2 sets of  troponins unremarkable.  Improving creatinine from prior.  CT chest demonstrates nondisplaced xiphoid process fracture.  Room air saturations 100%.  Patient received Norco in triage and reports pain significantly improved.  Will discharge home with prescription for Norco and incentive spirometer.  Also encouraged patient to follow-up for supraclavicular lymphadenopathy and thoracic aneurysm which were incidental findings on CT scan.  Strict return precautions given.  Patient verbalizes understanding and agrees with plan of care.      ____________________________________________   FINAL CLINICAL IMPRESSION(S) / ED DIAGNOSES  Final diagnoses:  Fracture of xiphoid process, initial encounter for closed fracture  Contusion of chest wall, unspecified laterality, initial encounter  Supraclavicular adenopathy  Thoracic aortic aneurysm without rupture [facility])     ED Discharge Orders         Ordered    HYDROcodone-acetaminophen (NORCO) 5-325 MG tablet  Every 6 hours PRN        11/16/20 0433          *Please note:  Nathan Singleton was evaluated in Emergency Department on 11/16/2020 for the symptoms described in the history of present illness. He was evaluated in the context of the global COVID-19 pandemic, which necessitated consideration that the patient might be at risk for infection with the SARS-CoV-2 virus that causes COVID-19. Institutional protocols and algorithms  that pertain to the evaluation of patients at risk for COVID-19 are in a state of rapid change based on information released by regulatory bodies including the CDC and federal and state organizations. These policies and algorithms were followed during the patient's care in the ED.  Some ED evaluations and interventions may be delayed as a result of limited staffing during and the pandemic.*   Note:  This document was prepared using Dragon voice recognition software and may include unintentional dictation errors.   Paulette Blanch,  MD 11/16/20 [phone]

## 2020-11-16 NOTE — Discharge Instructions (Signed)
You have a small fracture of the xiphoid process, which is part of your sternum.  This will not require surgery.  It will heal on its own.  You may take Norco as needed for pain.  On your CT scan, there is also enlarged lymph nodes.  Please follow-up with your doctor for further evaluation of these enlarged lymph nodes, probably with ultrasound.  You also have an aneurysm along your thoracic aorta.  Please schedule an appointment with the vascular surgeon for further evaluation.  Return to the ER for worsening symptoms, persistent vomiting, difficulty breathing or other concerns.

## 2020-11-16 NOTE — ED Notes (Signed)
Incentive spirometer provided to patient and patient instructed on use. Verbalized and demonstrated understanding of correct use.

## 2020-11-18 ENCOUNTER — Other Ambulatory Visit: Payer: Self-pay

## 2020-11-18 DIAGNOSIS — I10 Essential (primary) hypertension: Secondary | ICD-10-CM | POA: Insufficient documentation

## 2020-11-18 DIAGNOSIS — M1990 Unspecified osteoarthritis, unspecified site: Secondary | ICD-10-CM | POA: Insufficient documentation

## 2020-11-18 DIAGNOSIS — E119 Type 2 diabetes mellitus without complications: Secondary | ICD-10-CM | POA: Insufficient documentation

## 2020-11-19 ENCOUNTER — Encounter: Payer: Self-pay | Admitting: Cardiology

## 2020-11-19 ENCOUNTER — Other Ambulatory Visit: Payer: Self-pay

## 2020-11-19 ENCOUNTER — Ambulatory Visit: Payer: Medicare Other | Admitting: Cardiology

## 2020-11-19 VITALS — BP 136/86 | HR 60 | Ht 72.0 in | Wt 250.0 lb

## 2020-11-19 DIAGNOSIS — Z953 Presence of xenogenic heart valve: Secondary | ICD-10-CM | POA: Diagnosis not present

## 2020-11-19 DIAGNOSIS — I739 Peripheral vascular disease, unspecified: Secondary | ICD-10-CM | POA: Diagnosis not present

## 2020-11-19 DIAGNOSIS — I712 Thoracic aortic aneurysm, without rupture: Secondary | ICD-10-CM

## 2020-11-19 DIAGNOSIS — Z950 Presence of cardiac pacemaker: Secondary | ICD-10-CM | POA: Insufficient documentation

## 2020-11-19 DIAGNOSIS — I7121 Aneurysm of the ascending aorta, without rupture: Secondary | ICD-10-CM

## 2020-11-19 DIAGNOSIS — I25709 Atherosclerosis of coronary artery bypass graft(s), unspecified, with unspecified angina pectoris: Secondary | ICD-10-CM

## 2020-11-19 DIAGNOSIS — Z87898 Personal history of other specified conditions: Secondary | ICD-10-CM | POA: Diagnosis not present

## 2020-11-19 NOTE — Progress Notes (Signed)
Cardiology Consultation:    Date:  11/19/2020   ID:  Nathan Singleton, DOB [birth year], MRN [id number]  PCP:  Ranae Plumber, [place]  Cardiologist:  Jenne Campus, MD   Referring MD: No ref. provider found   Chief Complaint  Patient presents with  . Follow-up  I would like to be establish as a patient in the practice  History of Present Illness:    Nathan Singleton is a 79 y.o. male who is being seen today for the evaluation of multiple cardiac issues at the request of No ref. provider found.  Complex past medical history.  [place]  His past medical history significant for coronary artery disease with no details regarding that issue, pacemaker which appears to be central device, status post TAVR done in 2019, peripheral vascular disease status post popliteal stent on many years ago, essential hypertension, history of recurrent GI bleed which required hospitalization and GI procedures.  He came recently to round of hospital because of episode of syncope.  It was felt to be related to acute blood loss.  He was treated appropriately and discharged home.  Also recently he was walking he tripped and he fell injuring his sternum.  He ended going to the emergency room.  Still complaining of having chest pain in the place of the injury.  At that time CT of his chest being done and he was identified to have ascending aortic aneurysm measuring only 4 cm. He comes today 2 months for follow-up.  Overall he is doing fine but complain of having some chest pain especially on palpation on deep breathing.  Described to have some swelling of lower extremities which is mild and that being there all the time.  He is able to walk around and take care of activities daily living however complain of having some exertional leg fatigue and tiredness, since the time he has been discharged from hospital there is no syncope no dizziness no passing out.  Past Medical History:  Diagnosis Date  .  Aneurysm (arteriovenous) of coronary vessels   . Arthritis   . Diabetes mellitus without complication ([place])   . Hypertension     Past Surgical History:  Procedure Laterality Date  . Ankle repaired Right   . Carpal Tunnel repaired Left   . CATARACT EXTRACTION Bilateral   . COLONOSCOPY    . Copillar implant     . DG CHOLECYSTOGRAPHY GALL BLADDER ([facility])    . GASTRIC BYPASS    . heart valve replaced    . PACEMAKER GENERATOR CHANGE    . REPLACEMENT TOTAL KNEE BILATERAL    . RTC    . STENT PLACE LEFT URETER ([facility] HX) Right    Right Leg  . Toe nail removed Bilateral     Current Medications: Current Meds  Medication Sig  . allopurinol (ZYLOPRIM) 100 MG tablet Take 100 mg by mouth daily.  Marland Kitchen amLODipine (NORVASC) 10 MG tablet Take 10 mg by mouth daily.  . colchicine 0.6 MG tablet Take 0.6 mg by mouth daily.  . furosemide (LASIX) 20 MG tablet Take 20 mg by mouth daily.  Marland Kitchen HYDROcodone-acetaminophen (NORCO) 5-325 MG tablet Take 1 tablet by mouth every 6 (six) hours as needed for moderate pain.  Marland Kitchen insulin glargine (LANTUS) 100 UNIT/ML injection Inject into the skin daily.  Marland Kitchen lidocaine (LIDODERM) 5 % Place 1 patch onto the skin every 12 (twelve) hours. Remove & Discard patch within 12 hours or as directed by  MD  . liraglutide (VICTOZA) 18 MG/3ML SOPN Inject into the skin.  . methocarbamol (ROBAXIN) 500 MG tablet Take 1 tablet (500 mg total) by mouth 4 (four) times daily.  . predniSONE (DELTASONE) 50 MG tablet Take 1 tablet (50 mg total) by mouth daily with breakfast.  . traMADol (ULTRAM) 50 MG tablet Take 1 tablet (50 mg total) by mouth every 6 (six) hours as needed.     Allergies:   Doxycycline and Metformin   Social History   Socioeconomic History  . Marital status: Single    Spouse name: Not on file  . Number of children: Not on file  . Years of education: Not on file  . Highest education level: Not on file  Occupational History  . Not on file  Tobacco Use  . Smoking  status: Former Research scientist (life sciences)  . Smokeless tobacco: Never Used  Substance and Sexual Activity  . Alcohol use: Not Currently  . Drug use: Not Currently  . Sexual activity: Not on file  Other Topics Concern  . Not on file  Social History Narrative  . Not on file   Social Determinants of Health   Financial Resource Strain:   . Difficulty of Paying Living Expenses: Not on file  Food Insecurity:   . Worried About Charity fundraiser in the Last Year: Not on file  . Ran Out of Food in the Last Year: Not on file  Transportation Needs:   . Lack of Transportation (Medical): Not on file  . Lack of Transportation (Non-Medical): Not on file  Physical Activity:   . Days of Exercise per Week: Not on file  . Minutes of Exercise per Session: Not on file  Stress:   . Feeling of Stress : Not on file  Social Connections:   . Frequency of Communication with Friends and Family: Not on file  . Frequency of Social Gatherings with Friends and Family: Not on file  . Attends Religious Services: Not on file  . Active Member of Clubs or Organizations: Not on file  . Attends Archivist Meetings: Not on file  . Marital Status: Not on file     Family History: The patient's family history includes Alzheimer's disease in his mother; Diabetes in his father; Heart disease in his father and mother. ROS:   Please see the history of present illness.    All 14 point review of systems negative except as described per history of present illness.  EKGs/Labs/Other Studies Reviewed:    The following studies were reviewed today:   EKG:  EKG is  ordered today.  The ekg ordered today demonstrates   Recent Labs: 10/13/2020: ALT 46 11/15/2020: BUN 61; Creatinine, Ser 1.48; Hemoglobin 11.0; Platelets 106; Potassium 4.8; Sodium 139  Recent Lipid Panel No results found for: CHOL, TRIG, HDL, CHOLHDL, VLDL, LDLCALC, LDLDIRECT  Physical Exam:    VS:  BP 136/86 (BP Location: Left Arm, Patient Position: Sitting)    Pulse 60   Ht 6' (1.829 m)   Wt 250 lb (113.4 kg)   SpO2 92%   BMI 33.91 kg/m     Wt Readings from Last 3 Encounters:  11/19/20 250 lb (113.4 kg)  11/15/20 249 lb 1.9 oz (113 kg)  10/13/20 250 lb (113.4 kg)     GEN:  Well nourished, well developed in no acute distress HEENT: Normal NECK: No JVD; No carotid bruits LYMPHATICS: No lymphadenopathy CARDIAC: RRR, no murmurs, no rubs, no gallops RESPIRATORY:  Clear to auscultation without  rales, wheezing or rhonchi, poor air entry bilaterally ABDOMEN: Soft, non-tender, non-distended MUSCULOSKELETAL:  No edema; No deformity  SKIN: Warm and dry NEUROLOGIC:  Alert and oriented x 3 PSYCHIATRIC:  Normal affect  I cannot palpate pulses in lower extremities  ASSESSMENT:   1.  Status post TAVR. 2.  Pacemaker present. 3.  Peripheral vascular disease. 4.  History of GI bleed. 5.  Ascending arctic aneurysm. 6.  Essential hypertension: 7.  Dyslipidemia:  PLAN:    In order of problems listed above:  1. Status post TAVR done in 2019,I did review his echocardiogram from the hospital which showed preserved left ventricle ejection fraction 6065%, calculated aortic valve area was 1.33 cm with peak gradient of 19 and mean of 11 mmHg.  He is aware of endocarditis prophylaxis. 2. Pacemaker which apparently is operative device.  The device has been interrogated while he was in the hospital normal function no discharges no explanation for his syncope. 3. Peripheral vascular disease with symptoms of claudications.  He be scheduled to have arterial duplex evaluation of lower extremities. 4. History of GI bleeding: We will check a CBC today.  He is off any antiplatelets therapy secondary to recent GI bleed. 5. Ascending aortic aneurysm measuring 40 mm.  Need to have a periodic follow-up and good control of the blood pressure. 6. Essential hypertension: Blood pressure well controlled continue present management. 7. Dyslipidemia: We will check his fasting  lipid profile.  He is not on statin but he need to be on i at least moderate intensity statin.  We will make decision after cholesterol will be checked  He lives in [place].  Apparently he ended up in our hospital because at that time temporarily he lives with his family here in [place].  He prefers to be follow-up in office in [place] which is much closer to our office here.  We will make arrangements for him to follow-up there.   Medication Adjustments/Labs and Tests Ordered: Current medicines are reviewed at length with the patient today.  Concerns regarding medicines are outlined above.  No orders of the defined types were placed in this encounter.  No orders of the defined types were placed in this encounter.   Signed, Park Liter, MD, [facility]. 11/19/2020 2:14 PM    [facility]

## 2020-11-19 NOTE — Patient Instructions (Signed)
Medication Instructions:  Your physician recommends that you continue on your current medications as directed. Please refer to the Current Medication list given to you today.' *If you need a refill on your cardiac medications before your next appointment, please call your pharmacy*   Lab Work: Your physician recommends that you return for lab work today: bmp, cbc   If you have labs (blood work) drawn today and your tests are completely normal, you will receive your results only by: Marland Kitchen MyChart Message (if you have MyChart) OR . A paper copy in the mail If you have any lab test that is abnormal or we need to change your treatment, we will call you to review the results.   Testing/Procedures: Your physician has requested that you have a lower extremity arterial exercise duplex. During this test, exercise and ultrasound are used to evaluate arterial blood flow in the legs. Allow one hour for this exam. There are no restrictions or special instructions.   Follow-Up: At [facility], you and your health needs are our priority.  As part of our continuing mission to provide you with exceptional heart care, we have created designated Provider Care Teams.  These Care Teams include your primary Cardiologist (physician) and Advanced Practice Providers (APPs -  Physician Assistants and Nurse Practitioners) who all work together to provide you with the care you need, when you need it.  We recommend signing up for the patient portal called "MyChart".  Sign up information is provided on this After Visit Summary.  MyChart is used to connect with patients for Virtual Visits (Telemedicine).  Patients are able to view lab/test results, encounter notes, upcoming appointments, etc.  Non-urgent messages can be sent to your provider as well.   To learn more about what you can do with MyChart, go to [email or web address].    Your next appointment:   1 month fu in [place]

## 2020-11-20 ENCOUNTER — Telehealth: Payer: Self-pay | Admitting: Emergency Medicine

## 2020-11-20 ENCOUNTER — Telehealth: Payer: Self-pay | Admitting: Cardiology

## 2020-11-20 DIAGNOSIS — I25709 Atherosclerosis of coronary artery bypass graft(s), unspecified, with unspecified angina pectoris: Secondary | ICD-10-CM

## 2020-11-20 DIAGNOSIS — I10 Essential (primary) hypertension: Secondary | ICD-10-CM

## 2020-11-20 LAB — CBC
Hematocrit: 34.3 % — ABNORMAL LOW (ref 37.5–51.0)
Hemoglobin: 11.7 g/dL — ABNORMAL LOW (ref 13.0–17.7)
MCH: 32.6 pg (ref 26.6–33.0)
MCHC: 34.1 g/dL (ref 31.5–35.7)
MCV: 96 fL (ref 79–97)
Platelets: 113 10*3/uL — ABNORMAL LOW (ref 150–450)
RBC: 3.59 x10E6/uL — ABNORMAL LOW (ref 4.14–5.80)
RDW: 13.1 % (ref 11.6–15.4)
WBC: 3.6 10*3/uL (ref 3.4–10.8)

## 2020-11-20 LAB — LIPID PANEL
Chol/HDL Ratio: 3.3 ratio (ref 0.0–5.0)
Cholesterol, Total: 167 mg/dL (ref 100–199)
HDL: 51 mg/dL (ref >39)
LDL Chol Calc (NIH): 100 mg/dL — ABNORMAL HIGH (ref 0–99)
Triglycerides: 83 mg/dL (ref 0–149)
VLDL Cholesterol Cal: 16 mg/dL (ref 5–40)

## 2020-11-20 LAB — BASIC METABOLIC PANEL
BUN/Creatinine Ratio: 36 — ABNORMAL HIGH (ref 10–24)
BUN: 39 mg/dL — ABNORMAL HIGH (ref 8–27)
CO2: 18 mmol/L — ABNORMAL LOW (ref 20–29)
Calcium: 9.2 mg/dL (ref 8.6–10.2)
Chloride: 104 mmol/L (ref 96–106)
Creatinine, Ser: 1.07 mg/dL (ref 0.76–1.27)
GFR calc Af Amer: 76 mL/min/{1.73_m2} (ref >59)
GFR calc non Af Amer: 66 mL/min/{1.73_m2} (ref >59)
Glucose: 148 mg/dL — ABNORMAL HIGH (ref 65–99)
Potassium: 4.8 mmol/L (ref 3.5–5.2)
Sodium: 137 mmol/L (ref 134–144)

## 2020-11-20 MED ORDER — ROSUVASTATIN CALCIUM 10 MG PO TABS: 10.0000 mg | ORAL_TABLET | Freq: Every day | ORAL | 1 refills | Status: DC

## 2020-11-20 NOTE — Telephone Encounter (Signed)
-----   Message from Park Liter, MD sent at 11/20/2020  8:58 AM EST ----- He needs to be on cholesterol medication, his cholesterol is elevated.  Please add Crestor 10 mg daily, lipid profile AST ALT within next 6 weeks

## 2020-11-20 NOTE — Telephone Encounter (Signed)
Called patient. Informed his fiance per verbal permission of patient of results. Advised her to have him start crestor 10 mg daily and repeat labs in 6 weeks. She verbally understood no further questions.

## 2020-11-20 NOTE — Telephone Encounter (Signed)
Nathan Singleton is calling stating Nathan Singleton saw Nathan Singleton today and she is requesting his OV notes and results from yesterday. Please advise.

## 2020-11-21 ENCOUNTER — Emergency Department: Payer: Medicare Other

## 2020-11-21 ENCOUNTER — Observation Stay
Admission: EM | Admit: 2020-11-21 | Discharge: 2020-11-22 | Disposition: A | Discharge status: Home / Self Care | Payer: Medicare Other | Attending: Internal Medicine | Admitting: Internal Medicine

## 2020-11-21 ENCOUNTER — Other Ambulatory Visit: Payer: Self-pay

## 2020-11-21 DIAGNOSIS — Z79899 Other long term (current) drug therapy: Secondary | ICD-10-CM | POA: Insufficient documentation

## 2020-11-21 DIAGNOSIS — R079 Chest pain, unspecified: Secondary | ICD-10-CM | POA: Diagnosis present

## 2020-11-21 DIAGNOSIS — E119 Type 2 diabetes mellitus without complications: Secondary | ICD-10-CM | POA: Insufficient documentation

## 2020-11-21 DIAGNOSIS — Z96653 Presence of artificial knee joint, bilateral: Secondary | ICD-10-CM | POA: Insufficient documentation

## 2020-11-21 DIAGNOSIS — Z87891 Personal history of nicotine dependence: Secondary | ICD-10-CM | POA: Insufficient documentation

## 2020-11-21 DIAGNOSIS — K922 Gastrointestinal hemorrhage, unspecified: Secondary | ICD-10-CM | POA: Insufficient documentation

## 2020-11-21 DIAGNOSIS — Z794 Long term (current) use of insulin: Secondary | ICD-10-CM | POA: Diagnosis not present

## 2020-11-21 DIAGNOSIS — Z953 Presence of xenogenic heart valve: Secondary | ICD-10-CM

## 2020-11-21 DIAGNOSIS — I5032 Chronic diastolic (congestive) heart failure: Secondary | ICD-10-CM | POA: Insufficient documentation

## 2020-11-21 DIAGNOSIS — I25119 Atherosclerotic heart disease of native coronary artery with unspecified angina pectoris: Secondary | ICD-10-CM | POA: Insufficient documentation

## 2020-11-21 DIAGNOSIS — D696 Thrombocytopenia, unspecified: Secondary | ICD-10-CM

## 2020-11-21 DIAGNOSIS — I152 Hypertension secondary to endocrine disorders: Secondary | ICD-10-CM | POA: Diagnosis present

## 2020-11-21 DIAGNOSIS — I493 Ventricular premature depolarization: Secondary | ICD-10-CM

## 2020-11-21 DIAGNOSIS — I110 Hypertensive heart disease with heart failure: Secondary | ICD-10-CM | POA: Diagnosis not present

## 2020-11-21 DIAGNOSIS — I7121 Aneurysm of the ascending aorta, without rupture: Secondary | ICD-10-CM | POA: Diagnosis present

## 2020-11-21 DIAGNOSIS — R0789 Other chest pain: Secondary | ICD-10-CM | POA: Diagnosis present

## 2020-11-21 DIAGNOSIS — Z950 Presence of cardiac pacemaker: Secondary | ICD-10-CM | POA: Diagnosis not present

## 2020-11-21 DIAGNOSIS — I712 Thoracic aortic aneurysm, without rupture: Secondary | ICD-10-CM | POA: Diagnosis present

## 2020-11-21 DIAGNOSIS — E1159 Type 2 diabetes mellitus with other circulatory complications: Secondary | ICD-10-CM | POA: Diagnosis present

## 2020-11-21 DIAGNOSIS — I739 Peripheral vascular disease, unspecified: Secondary | ICD-10-CM | POA: Diagnosis present

## 2020-11-21 DIAGNOSIS — Z8719 Personal history of other diseases of the digestive system: Secondary | ICD-10-CM

## 2020-11-21 DIAGNOSIS — E1169 Type 2 diabetes mellitus with other specified complication: Secondary | ICD-10-CM | POA: Diagnosis present

## 2020-11-21 DIAGNOSIS — Z20822 Contact with and (suspected) exposure to covid-19: Secondary | ICD-10-CM | POA: Diagnosis not present

## 2020-11-21 DIAGNOSIS — I2510 Atherosclerotic heart disease of native coronary artery without angina pectoris: Secondary | ICD-10-CM | POA: Diagnosis present

## 2020-11-21 DIAGNOSIS — Z952 Presence of prosthetic heart valve: Secondary | ICD-10-CM | POA: Insufficient documentation

## 2020-11-21 DIAGNOSIS — E785 Hyperlipidemia, unspecified: Secondary | ICD-10-CM | POA: Diagnosis present

## 2020-11-21 HISTORY — DX: Peripheral vascular disease, unspecified: I73.9

## 2020-11-21 HISTORY — DX: Type 2 diabetes mellitus with other specified complication: E11.69

## 2020-11-21 HISTORY — DX: Thoracic aortic aneurysm, without rupture: I71.2

## 2020-11-21 HISTORY — DX: Presence of prosthetic heart valve: Z95.2

## 2020-11-21 HISTORY — DX: Presence of cardiac pacemaker: Z95.0

## 2020-11-21 HISTORY — DX: Aneurysm of the ascending aorta, without rupture: I71.21

## 2020-11-21 HISTORY — DX: Personal history of other diseases of the digestive system: Z87.19

## 2020-11-21 HISTORY — DX: Type 2 diabetes mellitus with other circulatory complications: E11.59

## 2020-11-21 HISTORY — DX: Hypertension secondary to endocrine disorders: I15.2

## 2020-11-21 HISTORY — DX: Atherosclerotic heart disease of native coronary artery without angina pectoris: I25.10

## 2020-11-21 LAB — TYPE AND SCREEN
ABO/RH(D): O NEG
Antibody Screen: NEGATIVE

## 2020-11-21 LAB — CBC WITH DIFFERENTIAL/PLATELET
Abs Immature Granulocytes: 0 10*3/uL (ref 0.00–0.07)
Basophils Absolute: 0 10*3/uL (ref 0.0–0.1)
Basophils Relative: 1 %
Eosinophils Absolute: 0.2 10*3/uL (ref 0.0–0.5)
Eosinophils Relative: 6 %
HCT: 30.4 % — ABNORMAL LOW (ref 39.0–52.0)
Hemoglobin: 10.2 g/dL — ABNORMAL LOW (ref 13.0–17.0)
Immature Granulocytes: 0 %
Lymphocytes Relative: 29 %
Lymphs Abs: 1 10*3/uL (ref 0.7–4.0)
MCH: 32.5 pg (ref 26.0–34.0)
MCHC: 33.6 g/dL (ref 30.0–36.0)
MCV: 96.8 fL (ref 80.0–100.0)
Monocytes Absolute: 0.3 10*3/uL (ref 0.1–1.0)
Monocytes Relative: 8 %
Neutro Abs: 2 10*3/uL (ref 1.7–7.7)
Neutrophils Relative %: 56 %
Platelets: 93 10*3/uL — ABNORMAL LOW (ref 150–400)
RBC: 3.14 MIL/uL — ABNORMAL LOW (ref 4.22–5.81)
RDW: 13 % (ref 11.5–15.5)
WBC: 3.5 10*3/uL — ABNORMAL LOW (ref 4.0–10.5)
nRBC: 0 % (ref 0.0–0.2)

## 2020-11-21 LAB — COMPREHENSIVE METABOLIC PANEL
ALT: 37 U/L (ref 0–44)
AST: 35 U/L (ref 15–41)
Albumin: 3.7 g/dL (ref 3.5–5.0)
Alkaline Phosphatase: 82 U/L (ref 38–126)
Anion gap: 8 (ref 5–15)
BUN: 49 mg/dL — ABNORMAL HIGH (ref 8–23)
CO2: 23 mmol/L (ref 22–32)
Calcium: 8.7 mg/dL — ABNORMAL LOW (ref 8.9–10.3)
Chloride: 104 mmol/L (ref 98–111)
Creatinine, Ser: 1.22 mg/dL (ref 0.61–1.24)
GFR, Estimated: >60 mL/min (ref >60)
Glucose, Bld: 121 mg/dL — ABNORMAL HIGH (ref 70–99)
Potassium: 4.8 mmol/L (ref 3.5–5.1)
Sodium: 135 mmol/L (ref 135–145)
Total Bilirubin: 0.6 mg/dL (ref 0.3–1.2)
Total Protein: 6.9 g/dL (ref 6.5–8.1)

## 2020-11-21 LAB — TROPONIN I (HIGH SENSITIVITY)
Troponin I (High Sensitivity): 9 ng/L (ref <18)
Troponin I (High Sensitivity): 9 ng/L (ref <18)

## 2020-11-21 LAB — RESP PANEL BY RT-PCR (FLU A&B, COVID) ARPGX2
Influenza A by PCR: NEGATIVE
Influenza B by PCR: NEGATIVE
SARS Coronavirus 2 by RT PCR: NEGATIVE

## 2020-11-21 LAB — CBG MONITORING, ED: Glucose-Capillary: 198 mg/dL — ABNORMAL HIGH (ref 70–99)

## 2020-11-21 LAB — MAGNESIUM: Magnesium: 2.1 mg/dL (ref 1.7–2.4)

## 2020-11-21 MED ORDER — ROSUVASTATIN CALCIUM 20 MG PO TABS
10.0000 mg | ORAL_TABLET | Freq: Every day | ORAL | Status: DC
  Administered 2020-11-21 – 2020-11-22 (×2): 10 mg via ORAL
  Filled 2020-11-21 (×3): qty 1

## 2020-11-21 MED ORDER — ALUM & MAG HYDROXIDE-SIMETH 200-200-20 MG/5ML PO SUSP
30.0000 mL | Freq: Once | ORAL | Status: AC
  Administered 2020-11-21: 30 mL via ORAL
  Filled 2020-11-21: qty 30

## 2020-11-21 MED ORDER — ONDANSETRON HCL 4 MG/2ML IJ SOLN: 4.0000 mg | Freq: Four times a day (QID) | INTRAMUSCULAR | Status: DC | PRN

## 2020-11-21 MED ORDER — ACETAMINOPHEN 325 MG PO TABS
650.0000 mg | ORAL_TABLET | Freq: Four times a day (QID) | ORAL | Status: DC | PRN
  Administered 2020-11-22 (×2): 650 mg via ORAL
  Filled 2020-11-21 (×2): qty 2

## 2020-11-21 MED ORDER — ONDANSETRON HCL 4 MG PO TABS: 4.0000 mg | ORAL_TABLET | Freq: Four times a day (QID) | ORAL | Status: DC | PRN

## 2020-11-21 MED ORDER — INSULIN GLARGINE 100 UNIT/ML ~~LOC~~ SOLN
10.0000 [IU] | Freq: Every day | SUBCUTANEOUS | Status: DC
  Administered 2020-11-21: 10 [IU] via SUBCUTANEOUS
  Filled 2020-11-21 (×2): qty 0.1

## 2020-11-21 MED ORDER — LIDOCAINE VISCOUS HCL 2 % MT SOLN
15.0000 mL | Freq: Once | OROMUCOSAL | Status: AC
  Administered 2020-11-21: 15 mL via ORAL
  Filled 2020-11-21: qty 15

## 2020-11-21 MED ORDER — INSULIN ASPART 100 UNIT/ML ~~LOC~~ SOLN
0.0000 [IU] | Freq: Three times a day (TID) | SUBCUTANEOUS | Status: DC
  Administered 2020-11-22 (×2): 3 [IU] via SUBCUTANEOUS
  Filled 2020-11-21 (×2): qty 1

## 2020-11-21 MED ORDER — AMLODIPINE BESYLATE 5 MG PO TABS
10.0000 mg | ORAL_TABLET | Freq: Every day | ORAL | Status: DC
  Administered 2020-11-21 – 2020-11-22 (×2): 10 mg via ORAL
  Filled 2020-11-21 (×2): qty 2

## 2020-11-21 MED ORDER — SODIUM CHLORIDE 0.9 % IV SOLN
80.0000 mg | Freq: Once | INTRAVENOUS | Status: AC
  Administered 2020-11-21: 80 mg via INTRAVENOUS
  Filled 2020-11-21: qty 80

## 2020-11-21 MED ORDER — FUROSEMIDE 20 MG PO TABS
20.0000 mg | ORAL_TABLET | Freq: Every day | ORAL | Status: DC
  Administered 2020-11-22: 20 mg via ORAL
  Filled 2020-11-21 (×2): qty 1

## 2020-11-21 MED ORDER — SODIUM CHLORIDE 0.9% FLUSH
3.0000 mL | Freq: Two times a day (BID) | INTRAVENOUS | Status: DC
  Administered 2020-11-21 – 2020-11-22 (×2): 3 mL via INTRAVENOUS

## 2020-11-21 MED ORDER — ACETAMINOPHEN 650 MG RE SUPP: 650.0000 mg | Freq: Four times a day (QID) | RECTAL | Status: DC | PRN

## 2020-11-21 MED ORDER — PANTOPRAZOLE SODIUM 40 MG IV SOLR
40.0000 mg | Freq: Two times a day (BID) | INTRAVENOUS | Status: DC
  Administered 2020-11-22: 40 mg via INTRAVENOUS
  Filled 2020-11-21: qty 40

## 2020-11-21 MED ORDER — CARVEDILOL 6.25 MG PO TABS
6.2500 mg | ORAL_TABLET | Freq: Two times a day (BID) | ORAL | Status: DC
  Administered 2020-11-22: 6.25 mg via ORAL
  Filled 2020-11-21: qty 1

## 2020-11-21 NOTE — H&P (Signed)
History and Physical    Nathan Singleton ZOX:[id number] DOB: Jul 28, 1941 DOA: 11/21/2020  PCP: Ranae Plumber, [place]  Patient coming from: Home  I have personally briefly reviewed patient's old medical records in [place]  Chief Complaint: Chest pain  HPI: Nathan Singleton is a 79 y.o. male with medical history significant for unspecified CAD, aortic stenosis s/p TAVR, CHB/SSS s/p PPM, PVD s/p popliteal stenting, insulin-dependent type 2 diabetes, hypertension, hyperlipidemia, history of GI bleeding, ascending aortic aneurysm who presents to the ED for evaluation of chest and epigastric discomfort.  Patient recently seen in the ED on 11/16/2020 for chest wall pain after a fall.  He was found to have a nondisplaced xiphoid process fracture.  He was discharged to home with prescription for Norco and incentive spirometer.  Patient returns to the ED with continued lower sternal and epigastric chest discomfort.  He has some radiation of his pain towards his left side.  He denies any nausea or vomiting.  He has not seen any obvious bleeding including melena or BRBPR.  He says he had a recent GI bleed requiring hospitalization and endoscopic procedures.  He is not sure which hospital he was in due to being unfamiliar with the area since he just moved down here, but he believes this was [facility] in [place].  He says his prior cardiac and GI care was through [facility] in [place], [place].  He says he is a former smoker, quitting many years ago.  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  ED Course:  Initial vitals showed BP 186/75, pulse 60, RR 13, temp 98.6 F, SPO2 100% on room air.  Labs show WBC 3.5, hemoglobin 10.2, platelets 93,000, sodium 135, potassium 4.8, bicarb 23, BUN 49, creatinine 1.22, serum glucose 121, LFTs within normal limits, magnesium 2.1.  High-sensitivity troponin I is 9x2.  SARS-CoV-2 PCR panel  is ordered and pending.  2 view chest x-ray shows left-sided pacemaker in place, prior TAVR changes, no focal consolidation, edema, or effusion.  Per EDP, DRE shows Hemoccult negative brown stool in the rectal vault.  While in the ED telemetry was showing prolonged ectopy event with concern of 25 beats of V. tach.  EDP discussed with on-call cardiology who felt this was more likely to be an accelerated idioventricular rhythm and less likely V. tach.  Patient was given IV Protonix 80 mg once, GI cocktail, and the hospitalist service was consulted to admit for further evaluation and management.  Review of Systems: All systems reviewed and are negative except as documented in history of present illness above.   Past Medical History:  Diagnosis Date  . Aneurysm (arteriovenous) of coronary vessels   . Arthritis   . Ascending aortic aneurysm ([place])   . CAD (coronary artery disease)   . Diabetes mellitus without complication ([place])   . History of GI bleed   . Hyperlipidemia associated with type 2 diabetes mellitus ([place])   . Hypertension associated with diabetes ([place])   . Presence of permanent cardiac pacemaker   . PVD (peripheral vascular disease) ([place])   . S/P TAVR (transcatheter aortic valve replacement)     Past Surgical History:  Procedure Laterality Date  . Ankle repaired Right   . Carpal Tunnel repaired Left   . CATARACT EXTRACTION Bilateral   . COLONOSCOPY    . Copillar implant     . DG CHOLECYSTOGRAPHY GALL BLADDER ([place] HX)    .  GASTRIC BYPASS    . heart valve replaced    . PACEMAKER GENERATOR CHANGE    . REPLACEMENT TOTAL KNEE BILATERAL    . RTC    . STENT PLACE LEFT URETER ([facility] HX) Right    Right Leg  . Toe nail removed Bilateral     Social History:  reports that he has quit smoking. He has never used smokeless tobacco. He reports previous alcohol use. He reports previous drug use.  Allergies  Allergen Reactions  . Doxycycline   . Metformin     Family History   Problem Relation Age of Onset  . Heart disease Mother   . Alzheimer's disease Mother   . Heart disease Father   . Diabetes Father      Prior to Admission medications   Medication Sig Start Date End Date Taking? Authorizing Provider  allopurinol (ZYLOPRIM) 100 MG tablet Take 100 mg by mouth daily. 10/19/20   [provider]  amLODipine (NORVASC) 10 MG tablet Take 10 mg by mouth daily.    [provider]  colchicine 0.6 MG tablet Take 0.6 mg by mouth daily.    [provider]  furosemide (LASIX) 20 MG tablet Take 20 mg by mouth daily. 10/19/20   [provider]  HYDROcodone-acetaminophen (NORCO) 5-325 MG tablet Take 1 tablet by mouth every 6 (six) hours as needed for moderate pain. 11/16/20   Paulette Blanch, MD  insulin glargine (LANTUS) 100 UNIT/ML injection Inject into the skin daily.    [provider]  lidocaine (LIDODERM) 5 % Place 1 patch onto the skin every 12 (twelve) hours. Remove & Discard patch within 12 hours or as directed by MD 10/13/20 10/13/21  Blake Divine, MD  liraglutide (VICTOZA) 18 MG/3ML SOPN Inject into the skin.    [provider]  methocarbamol (ROBAXIN) 500 MG tablet Take 1 tablet (500 mg total) by mouth 4 (four) times daily. 10/07/20   Cuthriell, Charline Bills, PA-C  predniSONE (DELTASONE) 50 MG tablet Take 1 tablet (50 mg total) by mouth daily with breakfast. 10/07/20   Cuthriell, Charline Bills, PA-C  rosuvastatin (CRESTOR) 10 MG tablet Take 1 tablet (10 mg total) by mouth daily. 11/20/20 02/18/21  Park Liter, MD  traMADol (ULTRAM) 50 MG tablet Take 1 tablet (50 mg total) by mouth every 6 (six) hours as needed. 10/07/20   Darletta Moll, PA-C    Physical Exam: Vitals:   11/21/20 1715 11/21/20 1717 11/21/20 1730 11/21/20 1925  BP: (!) 178/89  (!) 186/75 (!) 172/82  Pulse: (!) 59  60 (!) 58  Resp: 12  13 14   Temp:      TempSrc:      SpO2: 100%  100% 100%  Weight:  11.3 kg    Height:  5\' 8"  (1.727 m)      Constitutional: Obese man resting in bed with head elevated, NAD, calm, comfortable Eyes: PERRL, lids and conjunctivae normal ENMT: Mucous membranes are moist. Posterior pharynx clear of any exudate or lesions.poor dentition.  Hard of hearing. Neck: normal, supple, no masses. Respiratory: clear to auscultation bilaterally, no wheezing, no crackles. Normal respiratory effort. No accessory muscle use.  Cardiovascular: Regular rate and rhythm, no murmurs / rubs / gallops.  Trace lower extremity edema. 2+ pedal pulses.  PPM in place left chest wall. Abdomen: Mild epigastric tenderness, no masses palpated. No hepatosplenomegaly. Bowel sounds positive.  Musculoskeletal: no clubbing / cyanosis.  Tender to light palpation over xiphoid process.  Good ROM, no  contractures. Normal muscle tone.  Skin: no rashes, lesions, ulcers. No induration Neurologic: CN 2-12 grossly intact. Sensation intact, Strength 5/5 in all 4.  Psychiatric: Normal judgment and insight. Alert and oriented x 3. Normal mood.   Labs on Admission: I have personally reviewed following labs and imaging studies  CBC: Recent Labs  Lab 11/15/20 2019 11/19/20 1439 11/21/20 1724  WBC 3.6* 3.6 3.5*  NEUTROABS  --   --  2.0  HGB 11.0* 11.7* 10.2*  HCT 32.4* 34.3* 30.4*  MCV 95.9 96 96.8  PLT 106* 113* 93*   Basic Metabolic Panel: Recent Labs  Lab 11/15/20 2019 11/19/20 1439 11/21/20 1724  NA 139 137 135  K 4.8 4.8 4.8  CL 103 104 104  CO2 23 18* 23  GLUCOSE 121* 148* 121*  BUN 61* 39* 49*  CREATININE 1.48* 1.07 1.22  CALCIUM 8.7* 9.2 8.7*  MG  --   --  2.1   GFR: Estimated Creatinine Clearance: 8 mL/min (by C-G formula based on SCr of 1.22 mg/dL). Liver Function Tests: Recent Labs  Lab 11/21/20 1724  AST 35  ALT 37  ALKPHOS 82  BILITOT 0.6  PROT 6.9  ALBUMIN 3.7   No results for input(s): LIPASE, AMYLASE in the last 168 hours. No results for input(s): AMMONIA in the last 168 hours. Coagulation  Profile: No results for input(s): INR, PROTIME in the last 168 hours. Cardiac Enzymes: No results for input(s): CKTOTAL, CKMB, CKMBINDEX, TROPONINI in the last 168 hours. BNP (last 3 results) No results for input(s): PROBNP in the last 8760 hours. HbA1C: No results for input(s): HGBA1C in the last 72 hours. CBG: No results for input(s): GLUCAP in the last 168 hours. Lipid Profile: Recent Labs    11/19/20 1439  CHOL 167  HDL 51  LDLCALC 100*  TRIG 83  CHOLHDL 3.3   Thyroid Function Tests: No results for input(s): TSH, T4TOTAL, FREET4, T3FREE, THYROIDAB in the last 72 hours. Anemia Panel: No results for input(s): VITAMINB12, FOLATE, FERRITIN, TIBC, IRON, RETICCTPCT in the last 72 hours. Urine analysis:    Component Value Date/Time   COLORURINE STRAW (A) 10/13/2020 1930   APPEARANCEUR CLEAR (A) 10/13/2020 1930   LABSPEC 1.007 10/13/2020 1930   PHURINE 5.0 10/13/2020 1930   GLUCOSEU >=500 (A) 10/13/2020 1930   HGBUR NEGATIVE 10/13/2020 1930   BILIRUBINUR NEGATIVE 10/13/2020 [place] NEGATIVE 10/13/2020 1930   PROTEINUR NEGATIVE 10/13/2020 1930   NITRITE NEGATIVE 10/13/2020 1930   LEUKOCYTESUR NEGATIVE 10/13/2020 1930    Radiological Exams on Admission: DG Chest 2 View  Result Date: 11/21/2020 CLINICAL DATA:  Chest pain today, history coronary artery disease and TAVR EXAM: CHEST - 2 VIEW COMPARISON:  11/15/2020 FINDINGS: LEFT subclavian transvenous pacemaker with leads projecting at RIGHT atrium and RIGHT ventricle. Normal heart size post TAVR. Mediastinal contours and pulmonary vascularity normal. Atherosclerotic calcification aorta. Mild chronic bronchitic changes without pulmonary infiltrate, pleural effusion or pneumothorax. Bones demineralized. IMPRESSION: No acute abnormalities. Post TAVR and pacemaker. Electronically Signed   By: Lavonia Dana M.D.   On: 11/21/2020 18:42    EKG: Personally reviewed. AV paced rhythm.  Similar to  prior.  Assessment/Plan Principal Problem:   Chest pain Active Problems:   Diabetes mellitus without complication (HCC)   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   Peripheral vascular disease, unspecified ([place]) stents to popliteal arteries many years ago   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  Hypertension associated with diabetes ([place])   Hyperlipidemia associated with type 2 diabetes mellitus ([place])   History of GI bleed   CAD (coronary artery disease)   Thrombocytopenia (HCC)  Nathan Singleton is a 79 y.o. male with medical history significant for unspecified CAD, s/p TAVR, SSS s/p PPM, PVD s/p popliteal stenting, insulin-dependent type 2 diabetes, hypertension, hyperlipidemia, history of GI bleeding, ascending aortic aneurysm who is admitted for evaluation of epigastric/chest pain  Epigastric/chest pain: Patient with epigastric/lower sternal chest discomfort, appears to be related to his known xiphoid process fracture after mechanical fall several days ago.  Feel this is less likely acute coronary syndrome given negative troponins x2 and no acute findings on EKG.  Further records are obtained.  Care everywhere and show that he had a negative stress test 06/15/2020.  Records do show a history of GI bleeding due to gastrojejunal ulcer when in [place] but he reports more recent hospitalization few weeks ago in a local hospital (?  [facility]).  He has slight drop in hemoglobin from recent baseline.  No obvious bleeding present. -Continue IV Protonix 40 mg twice daily -Repeat CBC in a.m. -Monitor for any signs/symptoms of bleeding -If stable overnight can likely DC to home with outpatient follow-up  History of GI bleed due to gastrojejunal ulcer: Per records in care everywhere.  Reported more recent local hospitalization at [facility] in September per significant other due to bleeding gastric ulcer requiring  endoscopic procedure to stop bleeding.  No obvious recent bleeding per patient and significant other.  Continue PPI as above and repeat labs in a.m.  CAD: S/p stenting per care everywhere records.  Left heart cath 12/12/2019 reported to show mild nonobstructive CAD, previously placed stent to the proximal RCA with mild in-stent stenosis.  More recent stress test 06/15/2020 was negative.  Troponins negative x2.  Chest discomfort likely from xiphoid process fracture.  Continue to monitor.  Significant other states that patient is taking aspirin 81 mg daily which we will hold for now due to recent GI bleeding.  Chronic diastolic CHF: Last echo through [place] 10/26/2019 showed EF 55-60% with abnormal LV diastolic function.  Has trace lower extremity edema but otherwise stable. -Continue Lasix 20 mg daily  PVD: With reported prior popliteal stenting.  Continue statin.  Aortic stenosis s/p TAVR CHB/SSS s/p PPM: Performed May 2019 at [facility] in [place], [place].  Thrombocytopenia: Appears to be chronic review of records through care everywhere.  No obvious bleeding.  Continue to monitor.  Type 2 diabetes: Unclear current regimen.  Previously on Lantus 22 units nightly and Victoza.  Placed on Lantus 10 units nightly plus sensitive SSI for now and adjust as needed.  Check A1c.  Hypertension: Continue amlodipine and carvedilol.  Hyperlipidemia: Continue rosuvastatin.  Ascending aortic aneurysm: Approximately 4 cm aneurysmal dilatation of the ascending thoracic aorta seen on CT chest 11/15/2020.  Following with cardiology, Dr. Nehemiah Massed.  OSA: Was using CPAP regularly before moving here.  His CPAP machine was reportedly recalled and he is establishing with PCP to undergo sleep study to obtain new CPAP.  Continue CPAP nightly while in hospital.  DVT prophylaxis: SCDs given thrombocytopenia and history of GI bleeding Code Status: DNR, confirmed with patient and significant  other. Family Communication: Discussed with patient's significant other Alda Berthold by phone. Disposition Plan: From home likely discharge to home Consults called: None Admission status:  Status is: Observation  The patient remains OBS appropriate and will d/c before 2 midnights.  Dispo: The patient is from: Home              Anticipated d/c is to: Home              Anticipated d/c date is: 1 day              Patient currently is not medically stable to d/c.   Zada Finders MD [facility]  If 7PM-7AM, please contact night-coverage [email or web address]  11/21/2020, 9:26 PM

## 2020-11-21 NOTE — ED Notes (Signed)
MD notified pt noted to have runs of [facility] on cardiac monitor. MD confirmed assessment and new EKG performed and provided to MD for evaluation. Copies of [facility] printed from external monitor and placed in patient chart. Patient denies worsening chest pain or pressure.

## 2020-11-21 NOTE — ED Triage Notes (Signed)
Pt arrived via EMS with chest pain from home

## 2020-11-21 NOTE — ED Notes (Signed)
Patient voided in toilet with standby assist from RN and utilizing cane. Ambulated independently. Placed back in bed with bed low and locked, side rails raised x2, and call bell in reach.

## 2020-11-21 NOTE — Telephone Encounter (Signed)
Notes faxed.

## 2020-11-21 NOTE — ED Provider Notes (Signed)
[facility] Emergency Department Provider Note ____________________________________________   Event Date/Time   First MD Initiated Contact with Patient 11/21/20 1706     (approximate)  I have reviewed the triage vital signs and the nursing notes.  HISTORY  Chief Complaint Chest Pain   HPI Jameson Tormey is a 79 y.o. malewho presents to the ED for evaluation of chest pain.   Chart review indicates hx HTN, DM on insulin, HLD.  [place] and established with Dr. Agustin Cree, [facility], on 12/7.  Hx CAD, pacer in pace, s/p TAVR in 2019, PAD s/p popliteal stents, GI bleed in the past.  Ascending aortic aneurysm 4 cm diameter. Overnight 12/3-12/4 ED visit after mechanical fall causing a nondisplaced xiphoid process fracture noted on CT chest without contrast.  Patient presents to the ED today from home with complaints of 1 day of intermittent epigastric pain and diarrhea.  Patient reports this pain feels different than his xiphoid process fracture.  He reports some mild nausea, but denies any emesis or lower abdominal pain.  Denies dysuria.  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  He reports a history of gastric ulcers and associated bleeding.  He reports hospitalization at another facility that he cannot recall about a month ago that required endoscopy and procedural intervention of a bleeding ulcer.  Past Medical History:  Diagnosis Date  . Aneurysm (arteriovenous) of coronary vessels   . Arthritis   . Diabetes mellitus without complication ([place])   . Hypertension     Patient Active Problem List   Diagnosis Date Noted  . 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 Pacemaker St Jude device 11/19/2020  . Peripheral vascular disease, unspecified ([place]) stents to popliteal arteries  many years ago 11/19/2020  . History of gingival bleeding 11/19/2020  . Coronary artery disease involving coronary bypass graft of native heart with angina pectoris ([place]) 11/19/2020  . 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 Hypertension   . Diabetes mellitus without complication ([place])   . Arthritis     Past Surgical History:  Procedure Laterality Date  . Ankle repaired Right   . Carpal Tunnel repaired Left   . CATARACT EXTRACTION Bilateral   . COLONOSCOPY    . Copillar implant     . DG CHOLECYSTOGRAPHY GALL BLADDER ([place] HX)    . GASTRIC BYPASS    . heart valve replaced    . PACEMAKER GENERATOR CHANGE    . REPLACEMENT TOTAL KNEE BILATERAL    . RTC    . STENT PLACE LEFT URETER ([facility] HX) Right    Right Leg  . Toe nail removed Bilateral     Prior to Admission medications   Medication Sig Start Date End Date Taking? Authorizing Provider  allopurinol (ZYLOPRIM) 100 MG tablet Take 100 mg by mouth daily. 10/19/20   [provider]  amLODipine (NORVASC) 10 MG tablet Take 10 mg by mouth daily.    [provider]  colchicine 0.6 MG tablet Take 0.6 mg by mouth daily.    [provider]  furosemide (LASIX) 20 MG tablet Take 20 mg by mouth daily. 10/19/20   [provider]  HYDROcodone-acetaminophen (NORCO) 5-325 MG tablet Take 1 tablet by mouth every 6 (six) hours as needed for moderate  pain. 11/16/20   Paulette Blanch, MD  insulin glargine (LANTUS) 100 UNIT/ML injection Inject into the skin daily.    [provider]  lidocaine (LIDODERM) 5 % Place 1 patch onto the skin every 12 (twelve) hours. Remove & Discard patch within 12 hours or as directed by MD 10/13/20 10/13/21  Blake Divine, MD  liraglutide (VICTOZA) 18 MG/3ML SOPN Inject into the skin.    [provider]  methocarbamol (ROBAXIN) 500 MG tablet Take 1 tablet (500 mg total) by mouth 4 (four) times daily. 10/07/20   Cuthriell, Charline Bills, PA-C   predniSONE (DELTASONE) 50 MG tablet Take 1 tablet (50 mg total) by mouth daily with breakfast. 10/07/20   Cuthriell, Charline Bills, PA-C  rosuvastatin (CRESTOR) 10 MG tablet Take 1 tablet (10 mg total) by mouth daily. 11/20/20 02/18/21  Park Liter, MD  traMADol (ULTRAM) 50 MG tablet Take 1 tablet (50 mg total) by mouth every 6 (six) hours as needed. 10/07/20   Cuthriell, Charline Bills, PA-C    Allergies Doxycycline and Metformin  Family History  Problem Relation Age of Onset  . Heart disease Mother   . Alzheimer's disease Mother   . Heart disease Father   . Diabetes Father     Social History Social History   Tobacco Use  . Smoking status: Former Research scientist (life sciences)  . Smokeless tobacco: Never Used  Substance Use Topics  . Alcohol use: Not Currently  . Drug use: Not Currently    Review of Systems  Constitutional: No fever/chills Eyes: No visual changes. ENT: No sore throat. Cardiovascular: Positive for chest pain. Respiratory: Denies shortness of breath. Gastrointestinal: no vomiting. No constipation. Positive for epigastric abdominal pain, nausea and diarrhea. Genitourinary: Negative for dysuria. Musculoskeletal: Negative for back pain. Skin: Negative for rash. Neurological: Negative for headaches, focal weakness or numbness.  ____________________________________________   PHYSICAL EXAM:  VITAL SIGNS: Vitals:   11/21/20 1730 11/21/20 1925  BP: (!) 186/75 (!) 172/82  Pulse: 60 (!) 58  Resp: 13 14  Temp:    SpO2: 100% 100%     Constitutional: Alert and oriented. Well appearing.  Very hard of hearing, conversational without distress. Eyes: Conjunctivae are normal. PERRL. EOMI. Head: Atraumatic. Nose: No congestion/rhinnorhea. Mouth/Throat: Mucous membranes are moist.  Oropharynx non-erythematous. Neck: No stridor. No cervical spine tenderness to palpation. Cardiovascular: Normal rate, regular rhythm. Grossly normal heart sounds.  Good peripheral  circulation. Respiratory: Normal respiratory effort.  No retractions. Lungs CTAB. Gastrointestinal: Soft , nondistended. No CVA tenderness. Epigastric tenderness that reproduces his symptoms without peritoneal features. GU: Rectal exam demonstrates no external blood or hemorrhoids.  Single anal skin tag.  DRE with brown stool that is Hemoccult negative. Musculoskeletal: No lower extremity tenderness nor edema.  No joint effusions. No signs of acute trauma. Neurologic:  Normal speech and language. No gross focal neurologic deficits are appreciated. No gait instability noted. Skin:  Skin is warm, dry and intact. No rash noted. Psychiatric: Mood and affect are normal. Speech and behavior are normal.  ____________________________________________   LABS (all labs ordered are listed, but only abnormal results are displayed)  Labs Reviewed  CBC WITH DIFFERENTIAL/PLATELET - Abnormal; Notable for the following components:      Result Value   WBC 3.5 (*)    RBC 3.14 (*)    Hemoglobin 10.2 (*)    HCT 30.4 (*)    Platelets 93 (*)    All other components within normal limits  COMPREHENSIVE METABOLIC PANEL - Abnormal; Notable for the  following components:   Glucose, Bld 121 (*)    BUN 49 (*)    Calcium 8.7 (*)    All other components within normal limits  RESP PANEL BY RT-PCR (FLU A&B, COVID) ARPGX2  MAGNESIUM  TYPE AND SCREEN  TYPE AND SCREEN  TROPONIN I (HIGH SENSITIVITY)  TROPONIN I (HIGH SENSITIVITY)   ____________________________________________  12 Lead EKG  Junctional rhythm, rate of 60 bpm.  Normal axis.  Right bundle branch block.  No evidence of acute ischemia. ____________________________________________  RADIOLOGY  ED MD interpretation: 2 view CXR reviewed by me without evidence of acute cardiopulmonary pathology.  Official radiology report(s): DG Chest 2 View  Result Date: 11/21/2020 CLINICAL DATA:  Chest pain today, history coronary artery disease and TAVR EXAM:  CHEST - 2 VIEW COMPARISON:  11/15/2020 FINDINGS: LEFT subclavian transvenous pacemaker with leads projecting at RIGHT atrium and RIGHT ventricle. Normal heart size post TAVR. Mediastinal contours and pulmonary vascularity normal. Atherosclerotic calcification aorta. Mild chronic bronchitic changes without pulmonary infiltrate, pleural effusion or pneumothorax. Bones demineralized. IMPRESSION: No acute abnormalities. Post TAVR and pacemaker. Electronically Signed   By: Lavonia Dana M.D.   On: 11/21/2020 18:42    ____________________________________________   PROCEDURES and INTERVENTIONS  Procedure(s) performed (including Critical Care):  .1-3 Lead EKG Interpretation Performed by: Vladimir Crofts, MD Authorized by: Vladimir Crofts, MD     Interpretation: abnormal     ECG rate:  60   ECG rate assessment: normal     Rhythm: other rhythm     Ectopy comment:  Vtach? on telemetry   Conduction: abnormal   .Critical Care Performed by: Vladimir Crofts, MD Authorized by: Vladimir Crofts, MD   Critical care provider statement:    Critical care time (minutes):  45   Critical care was necessary to treat or prevent imminent or life-threatening deterioration of the following conditions:  Cardiac failure   Critical care was time spent personally by me on the following activities:  Discussions with consultants, evaluation of patient's response to treatment, examination of patient, ordering and performing treatments and interventions, ordering and review of laboratory studies, ordering and review of radiographic studies, pulse oximetry, re-evaluation of patient's condition, obtaining history from patient or surrogate and review of old charts    Medications  pantoprazole (PROTONIX) 80 mg in sodium chloride 0.9 % 100 mL IVPB (0 mg Intravenous Stopped 11/21/20 2019)  alum & mag hydroxide-simeth (MAALOX/MYLANTA) 200-200-20 MG/5ML suspension 30 mL (30 mLs Oral Given 11/21/20 1932)    And  lidocaine (XYLOCAINE) 2 %  viscous mouth solution 15 mL (15 mLs Oral Given 11/21/20 1932)    ____________________________________________   MDM / ED COURSE   79 year old male with history of CAD presents to the ED with 1 day of intermittent chest pains, initially concerning for an upper GI bleed to me, but also having a prolonged run of ectopy on the monitor, requiring observation medical admission.  Normal vitals on room air.  Exam with epigastric pain without peritoneal features in a well-appearing patient.  Blood work shows 1.5 drop in hemoglobin and elevation in BUN compared to blood work obtained as an outpatient couple days ago, concerning for an upper GI bleed with his symptoms of diarrhea.  DRE shows Hemoccult negative brown stool in the rectal vault but I am still quite concerned about him bleeding, particularly considering his strong history of upper GI bleeds.  While awaiting some blood work for this, patient noted to have a prolonged ectopy event on telemetry that was nursing  concerning for about 25 beats of V. tach.  I reviewed telemetry with cardiology on-call, who indicates that this is less likely to be V. tach and more likely accelerated idioventricular rhythm of some other kind.  For observation of his hemoglobin and GI bleeding status, as well as keep the patient on telemetry for cardiac monitoring, we will admit the patient to hospitalist medicine for further work-up and management.   Clinical Course as of 11/21/20 2032  Thu Nov 21, 2020  1743 Drop in hemoglobin noted from previous outpatient labs.  Type and screen added [DS]  1953 Nurse notifies me of prolonged run of V. tach on the monitor.  Review of telemetry indicates greater than 25 beats of V. tach.  Immediate repeat EKG demonstrates regular junctional rhythm with a rate of 60 bpm.  Right bundle branch block.  No clear evidence of ischemia. [DS]  2007 I speak with Dr. Caryl Comes,  Who reviews tele remotely He would not call it VT, accelerated  idioventricular rhythm. Would not worry about it [DS]    Clinical Course User Index [DS] Vladimir Crofts, MD    ____________________________________________   FINAL CLINICAL IMPRESSION(S) / ED DIAGNOSES  Final diagnoses:  Other chest pain  Upper GI bleed  Ventricular ectopy     ED Discharge Orders    None       Kyi Romanello   Note:  This document was prepared using Dragon voice recognition software and may include unintentional dictation errors.   Vladimir Crofts, MD 11/21/20 2038

## 2020-11-21 NOTE — ED Notes (Signed)
Pt presents to ED with c/o of midsternum chest pain. Pt states recently fracturing sternum after tripping and falling into a washer. Chest pain is reproducible on palpation. 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 Pt states chest pain "feels weird". Pt states pain on breathing, coughing, or sneezing and states not being able to take full deep breaths due to pain, pt denies SOB. Pt denies N/V/D and fever and chills. Pt is A&Ox4 but HOH.

## 2020-11-21 NOTE — ED Notes (Signed)
Pharmacy contacted regarding verification of meds for administration.

## 2020-11-21 NOTE — ED Notes (Signed)
Patient reporting sensation of epigastric cramping and indigestion. MD made aware.

## 2020-11-22 DIAGNOSIS — I2510 Atherosclerotic heart disease of native coronary artery without angina pectoris: Secondary | ICD-10-CM | POA: Diagnosis not present

## 2020-11-22 DIAGNOSIS — E119 Type 2 diabetes mellitus without complications: Secondary | ICD-10-CM | POA: Diagnosis not present

## 2020-11-22 DIAGNOSIS — E1169 Type 2 diabetes mellitus with other specified complication: Secondary | ICD-10-CM

## 2020-11-22 DIAGNOSIS — R0789 Other chest pain: Secondary | ICD-10-CM | POA: Diagnosis not present

## 2020-11-22 DIAGNOSIS — E1159 Type 2 diabetes mellitus with other circulatory complications: Secondary | ICD-10-CM

## 2020-11-22 DIAGNOSIS — I152 Hypertension secondary to endocrine disorders: Secondary | ICD-10-CM

## 2020-11-22 DIAGNOSIS — I712 Thoracic aortic aneurysm, without rupture: Secondary | ICD-10-CM

## 2020-11-22 DIAGNOSIS — E785 Hyperlipidemia, unspecified: Secondary | ICD-10-CM

## 2020-11-22 DIAGNOSIS — Z8719 Personal history of other diseases of the digestive system: Secondary | ICD-10-CM

## 2020-11-22 LAB — CBC
HCT: 31.6 % — ABNORMAL LOW (ref 39.0–52.0)
Hemoglobin: 11.1 g/dL — ABNORMAL LOW (ref 13.0–17.0)
MCH: 33.3 pg (ref 26.0–34.0)
MCHC: 35.1 g/dL (ref 30.0–36.0)
MCV: 94.9 fL (ref 80.0–100.0)
Platelets: 92 10*3/uL — ABNORMAL LOW (ref 150–400)
RBC: 3.33 MIL/uL — ABNORMAL LOW (ref 4.22–5.81)
RDW: 13 % (ref 11.5–15.5)
WBC: 3.3 10*3/uL — ABNORMAL LOW (ref 4.0–10.5)
nRBC: 0 % (ref 0.0–0.2)

## 2020-11-22 LAB — BASIC METABOLIC PANEL
Anion gap: 9 (ref 5–15)
BUN: 44 mg/dL — ABNORMAL HIGH (ref 8–23)
CO2: 22 mmol/L (ref 22–32)
Calcium: 8.8 mg/dL — ABNORMAL LOW (ref 8.9–10.3)
Chloride: 107 mmol/L (ref 98–111)
Creatinine, Ser: 1.21 mg/dL (ref 0.61–1.24)
GFR, Estimated: >60 mL/min (ref >60)
Glucose, Bld: 164 mg/dL — ABNORMAL HIGH (ref 70–99)
Potassium: 4.1 mmol/L (ref 3.5–5.1)
Sodium: 138 mmol/L (ref 135–145)

## 2020-11-22 LAB — CBG MONITORING, ED
Glucose-Capillary: 208 mg/dL — ABNORMAL HIGH (ref 70–99)
Glucose-Capillary: 223 mg/dL — ABNORMAL HIGH (ref 70–99)

## 2020-11-22 MED ORDER — ROSUVASTATIN CALCIUM 20 MG PO TABS: 40.0000 mg | ORAL_TABLET | Freq: Every day | ORAL | 0 refills | Status: DC

## 2020-11-22 MED ORDER — HYDROCODONE-ACETAMINOPHEN 7.5-325 MG PO TABS: 1.0000 | ORAL_TABLET | Freq: Four times a day (QID) | ORAL | 0 refills | Status: DC | PRN

## 2020-11-22 MED ORDER — HYDROCODONE-ACETAMINOPHEN 7.5-325 MG PO TABS
1.0000 | ORAL_TABLET | Freq: Four times a day (QID) | ORAL | Status: DC | PRN
  Administered 2020-11-22: 1 via ORAL
  Filled 2020-11-22: qty 1

## 2020-11-22 NOTE — ED Notes (Signed)
Pt ambulated to bathroom with RN standby. Pt in NAD at this time. VSS.

## 2020-11-22 NOTE — Discharge Instructions (Signed)

## 2020-11-22 NOTE — Consult Note (Signed)
Nathan Singleton is a 79 y.o. male  [id number]  Primary Cardiologist: Last seen with Dr Agustin Cree in [place]. Reason for Consultation: Chest Pain  HPI: Patient is a 79 year old man with past medical history id CAD s/p RCA stent 2009, CHB/SSS s/p PPM, AS s/p TAVR 2019, PAD s/p popliteal stent, HTN, AAA, and GI Bleed. Patient recently seen in ED with Xiphoid process fracture on 12/4 and discharged home. Patient presents now with continued epigastric / chest pain. Trended troponin with maximum of 9. We have been consulted for patient's continued chest pain.   Review of Systems: Denies shortness of breath. Complaints of epigastric pain. Denies any dizziness.    Past Medical History:  Diagnosis Date  . Aneurysm (arteriovenous) of coronary vessels   . Arthritis   . Ascending aortic aneurysm ([place])   . CAD (coronary artery disease)   . Diabetes mellitus without complication ([place])   . History of GI bleed   . Hyperlipidemia associated with type 2 diabetes mellitus ([place])   . Hypertension associated with diabetes ([place])   . Presence of permanent cardiac pacemaker   . PVD (peripheral vascular disease) ([place])   . S/P TAVR (transcatheter aortic valve replacement)     (Not in a hospital admission)    . amLODipine  10 mg Oral Daily  . carvedilol  6.25 mg Oral BID WC  . furosemide  20 mg Oral Daily  . insulin aspart  0-9 Units Subcutaneous TID WC  . insulin glargine  10 Units Subcutaneous QHS  . pantoprazole (PROTONIX) IV  40 mg Intravenous Q12H  . rosuvastatin  10 mg Oral Daily  . sodium chloride flush  3 mL Intravenous Q12H    Infusions:   Allergies  Allergen Reactions  . Doxycycline   . Metformin     Social History   Socioeconomic History  . Marital status: Single    Spouse name: Not on file  . Number of children: Not on file  . Years of education: Not on file  . Highest education level: Not on file  Occupational History  . Not on file  Tobacco Use  . Smoking status:  Former Research scientist (life sciences)  . Smokeless tobacco: Never Used  Substance and Sexual Activity  . Alcohol use: Not Currently  . Drug use: Not Currently  . Sexual activity: Not on file  Other Topics Concern  . Not on file  Social History Narrative  . Not on file   Social Determinants of Health   Financial Resource Strain: Not on file  Food Insecurity: Not on file  Transportation Needs: Not on file  Physical Activity: Not on file  Stress: Not on file  Social Connections: Not on file  Intimate Partner Violence: Not on file    Family History  Problem Relation Age of Onset  . Heart disease Mother   . Alzheimer's disease Mother   . Heart disease Father   . Diabetes Father     PHYSICAL EXAM: Vitals:   11/22/20 0800 11/22/20 0900  BP:    Pulse:    Resp: 16 16  Temp:    SpO2:       Intake/Output Summary (Last 24 hours) at 11/22/2020 1056 Last data filed at 11/21/2020 2019 Gross per 24 hour  Intake 100 ml  Output --  Net 100 ml    General:  Well appearing. No respiratory difficulty HEENT: normal. HOH Neck: supple. no JVD. Carotids 2+ bilat; no bruits. No lymphadenopathy or thryomegaly appreciated. Cor: PMI nondisplaced.  Regular rate & rhythm. No rubs, gallops or murmurs. Lungs: clear Abdomen: soft, nontender, nondistended. No hepatosplenomegaly. No bruits or masses. Good bowel sounds. Extremities: +1 BLE pitting edema. Neuro: alert & oriented x 3, cranial nerves grossly intact. moves all 4 extremities w/o difficulty. Affect pleasant.  ECG: Ventricular paced. 60/bpm  Results for orders placed or performed during the hospital encounter of 11/21/20 (from the past 24 hour(s))  Troponin I (High Sensitivity)     Status: None   Collection Time: 11/21/20  5:24 PM  Result Value Ref Range   Troponin I (High Sensitivity) 9 <18 ng/L  CBC with Differential/Platelet     Status: Abnormal   Collection Time: 11/21/20  5:24 PM  Result Value Ref Range   WBC 3.5 (L) 4.0 - 10.5 K/uL   RBC 3.14  (L) 4.22 - 5.81 MIL/uL   Hemoglobin 10.2 (L) 13.0 - 17.0 g/dL   HCT 30.4 (L) 39.0 - 52.0 %   MCV 96.8 80.0 - 100.0 fL   MCH 32.5 26.0 - 34.0 pg   MCHC 33.6 30.0 - 36.0 g/dL   RDW 13.0 11.5 - 15.5 %   Platelets 93 (L) 150 - 400 K/uL   nRBC 0.0 0.0 - 0.2 %   Neutrophils Relative % 56 %   Neutro Abs 2.0 1.7 - 7.7 K/uL   Lymphocytes Relative 29 %   Lymphs Abs 1.0 0.7 - 4.0 K/uL   Monocytes Relative 8 %   Monocytes Absolute 0.3 0.1 - 1.0 K/uL   Eosinophils Relative 6 %   Eosinophils Absolute 0.2 0.0 - 0.5 K/uL   Basophils Relative 1 %   Basophils Absolute 0.0 0.0 - 0.1 K/uL   Immature Granulocytes 0 %   Abs Immature Granulocytes 0.00 0.00 - 0.07 K/uL  Comprehensive metabolic panel     Status: Abnormal   Collection Time: 11/21/20  5:24 PM  Result Value Ref Range   Sodium 135 135 - 145 mmol/L   Potassium 4.8 3.5 - 5.1 mmol/L   Chloride 104 98 - 111 mmol/L   CO2 23 22 - 32 mmol/L   Glucose, Bld 121 (H) 70 - 99 mg/dL   BUN 49 (H) 8 - 23 mg/dL   Creatinine, Ser 1.22 0.61 - 1.24 mg/dL   Calcium 8.7 (L) 8.9 - 10.3 mg/dL   Total Protein 6.9 6.5 - 8.1 g/dL   Albumin 3.7 3.5 - 5.0 g/dL   AST 35 15 - 41 U/L   ALT 37 0 - 44 U/L   Alkaline Phosphatase 82 38 - 126 U/L   Total Bilirubin 0.6 0.3 - 1.2 mg/dL   GFR, Estimated >60 >60 mL/min   Anion gap 8 5 - 15  Magnesium     Status: None   Collection Time: 11/21/20  5:24 PM  Result Value Ref Range   Magnesium 2.1 1.7 - 2.4 mg/dL  Type and screen [place]     Status: None (Preliminary result)   Collection Time: 11/21/20  7:24 PM  Result Value Ref Range   ABO/RH(D) PENDING    Antibody Screen PENDING    Sample Expiration      11/24/2020,2359 Performed at [facility], [address] [place], [place] [address]   Troponin I (High Sensitivity)     Status: None   Collection Time: 11/21/20  7:24 PM  Result Value Ref Range   Troponin I (High Sensitivity) 9 <18 ng/L  Resp Panel by RT-PCR (Flu A&B, Covid)  Nasopharyngeal Swab  Status: None   Collection Time: 11/21/20  9:44 PM   Specimen: Nasopharyngeal Swab; Nasopharyngeal(NP) swabs in vial transport medium  Result Value Ref Range   SARS Coronavirus 2 by RT PCR NEGATIVE NEGATIVE   Influenza A by PCR NEGATIVE NEGATIVE   Influenza B by PCR NEGATIVE NEGATIVE  Type and screen Ordered by PROVIDER DEFAULT     Status: None   Collection Time: 11/21/20  9:44 PM  Result Value Ref Range   ABO/RH(D) O NEG    Antibody Screen NEG    Sample Expiration      11/24/2020,2359 Performed at [facility], [address] [place], [place] [address]   CBG monitoring, ED     Status: Abnormal   Collection Time: 11/21/20 10:44 PM  Result Value Ref Range   Glucose-Capillary 198 (H) 70 - 99 mg/dL  CBC     Status: Abnormal   Collection Time: 11/22/20  4:17 AM  Result Value Ref Range   WBC 3.3 (L) 4.0 - 10.5 K/uL   RBC 3.33 (L) 4.22 - 5.81 MIL/uL   Hemoglobin 11.1 (L) 13.0 - 17.0 g/dL   HCT 31.6 (L) 39.0 - 52.0 %   MCV 94.9 80.0 - 100.0 fL   MCH 33.3 26.0 - 34.0 pg   MCHC 35.1 30.0 - 36.0 g/dL   RDW 13.0 11.5 - 15.5 %   Platelets 92 (L) 150 - 400 K/uL   nRBC 0.0 0.0 - 0.2 %  Basic metabolic panel     Status: Abnormal   Collection Time: 11/22/20  4:17 AM  Result Value Ref Range   Sodium 138 135 - 145 mmol/L   Potassium 4.1 3.5 - 5.1 mmol/L   Chloride 107 98 - 111 mmol/L   CO2 22 22 - 32 mmol/L   Glucose, Bld 164 (H) 70 - 99 mg/dL   BUN 44 (H) 8 - 23 mg/dL   Creatinine, Ser 1.21 0.61 - 1.24 mg/dL   Calcium 8.8 (L) 8.9 - 10.3 mg/dL   GFR, Estimated >60 >60 mL/min   Anion gap 9 5 - 15  CBG monitoring, ED     Status: Abnormal   Collection Time: 11/22/20  8:33 AM  Result Value Ref Range   Glucose-Capillary 208 (H) 70 - 99 mg/dL   DG Chest 2 View  Result Date: 11/21/2020 CLINICAL DATA:  Chest pain today, history coronary artery disease and TAVR EXAM: CHEST - 2 VIEW COMPARISON:  11/15/2020 FINDINGS: LEFT subclavian transvenous pacemaker with  leads projecting at RIGHT atrium and RIGHT ventricle. Normal heart size post TAVR. Mediastinal contours and pulmonary vascularity normal. Atherosclerotic calcification aorta. Mild chronic bronchitic changes without pulmonary infiltrate, pleural effusion or pneumothorax. Bones demineralized. IMPRESSION: No acute abnormalities. Post TAVR and pacemaker. Electronically Signed   By: Lavonia Dana M.D.   On: 11/21/2020 18:42     ASSESSMENT AND PLAN: Patient presenting to emergency department with chest pain. Patient had recent Xiphoid process fracture on 12/4. With negative troponin and no sign of ischemia on EKG, chest pain most likely non-cardiac and induced by current fracture. Pain is also located epigastric and tender to palpation. Patient with a history of GI bleed and therefore not on antiplatelets. Recent LDL 100 and with current history will increase rosuvastatin to 40mg . VSS on current medications. Patient states he had recent stress test which was normal and will request records from previous cardiologist. Will complete echocardiogram and further PVD work up as an outpatient. From a cardiology point of view the patient  is stable for discharge and we will see him in our office next week.  Adaline Sill NP-C

## 2020-11-22 NOTE — Progress Notes (Signed)
Pt assisted to bathroom with use of walker without difficulty in which he void at this time. Pt then assist back to room and started watching tv. Call bell within reach, will continue to monitor.

## 2020-11-22 NOTE — Progress Notes (Signed)
Pt off unit via w/c to pov (son is driving) in stable condition. PIV 20G d/c from (R) Forearm, catheter intact. Pt and son verbalize understanding of instructions and where to pick up new medications.

## 2020-11-22 NOTE — Progress Notes (Signed)
Norco 7.5/325 one tablet given due to pt's complain of pain to xiphoid process and left side of back - will continue to monitor.

## 2020-11-22 NOTE — Progress Notes (Signed)
Pt states, he is now having pain to his chest (xiphoid process) and left side of back. MD Swayze aware - awaiting new orders.

## 2020-11-22 NOTE — Discharge Summary (Signed)
Physician Discharge Summary  Nathan Singleton OZH:[id number] DOB: November 21, 1941 DOA: 11/21/2020  PCP: Ranae Plumber, PA  Admit date: 11/21/2020 Discharge date: 11/22/2020  Recommendations for Outpatient Follow-up:  1. Discharge to home 2. Follow up with PCP in 7-10 days 3. Follow up with Cardiology in one week for echocardiogram and PVD work up 4. No driving while taking narcotics.  Discharge Diagnoses: Principal diagnosis is #1 1.  Chest pain - noncardiac 2. Fractured manubrium 3. 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 CAD 5. Chronic diastolic CHF 6. PVD  Discharge Condition: Fair  Disposition: Home  Diet recommendation: Heart healthy  Filed Weights   11/21/20 1717  Weight: 11.3 kg    History of present illness: Nathan Singleton is a 79 y.o. male with medical history significant for unspecified CAD, aortic stenosis s/p TAVR, CHB/SSS s/p PPM, PVD s/p popliteal stenting, insulin-dependent type 2 diabetes, hypertension, hyperlipidemia, history of GI bleeding, ascending aortic aneurysm who presents to the ED for evaluation of chest and epigastric discomfort.  Patient recently seen in the ED on 11/16/2020 for chest wall pain after a fall.  He was found to have a nondisplaced xiphoid process fracture.  He was discharged to home with prescription for Norco and incentive spirometer.  Patient returns to the ED with continued lower sternal and epigastric chest discomfort.  He has some radiation of his pain towards his left side.  He denies any nausea or vomiting.  He has not seen any obvious bleeding including melena or BRBPR.  He says he had a recent GI bleed requiring hospitalization and endoscopic procedures.  He is not sure which hospital he was in due to being unfamiliar with the area since he just moved down here, but he believes this was [facility] in [place].  He says his prior cardiac and GI care was through [facility] in [place], [place].  He  says he is a former smoker, quitting many years ago.  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  ED Course:  Initial vitals showed BP 186/75, pulse 60, RR 13, temp 98.6 F, SPO2 100% on room air.  Labs show WBC 3.5, hemoglobin 10.2, platelets 93,000, sodium 135, potassium 4.8, bicarb 23, BUN 49, creatinine 1.22, serum glucose 121, LFTs within normal limits, magnesium 2.1.  High-sensitivity troponin I is 9x2.  Hospital Course: The patient was admitted to a telemetry bed. Cardiology was consulted. The patient was ruled out for MI by EKG and enzyme criteria.   Today's assessment: S: The patient is resting comfortably. He continues to have significant lower sternal pain particularly with palpation. O: Vitals:  Vitals:   11/22/20 0800 11/22/20 0900  BP:    Pulse:    Resp: 16 16  Temp:    SpO2:     Exam:  Constitutional:  . The patient is awake, alert, and oriented x 3. No acute distress. Respiratory:  . No increased work of breathing. . No wheezes, rales, or rhonchi . No tactile fremitus Cardiovascular:  . Regular rate and rhythm . No murmurs, ectopy, or gallups. . No lateral PMI. No thrills. . Pain with palpation of anterior chest wall. Abdomen:  . Abdomen is soft, non-tender, non-distended . No hernias, masses, or organomegaly . Normoactive bowel sounds.  Musculoskeletal:  . No cyanosis, clubbing, or edema Skin:  . No rashes, lesions, ulcers . palpation of skin: no induration or nodules Neurologic:  . CN 2-12 intact .  Sensation all 4 extremities intact Psychiatric:  . Mental status o Mood, affect appropriate o Orientation to person, place, time  . judgment and insight appear intact   Discharge Instructions  Discharge Instructions    Activity as tolerated - No restrictions   Complete by: As directed    Call MD for:  difficulty breathing, headache or visual disturbances   Complete by: As directed    Call  MD for:  severe uncontrolled pain   Complete by: As directed    Call MD for:  temperature >100.4   Complete by: As directed    Diet - low sodium heart healthy   Complete by: As directed    Discharge instructions   Complete by: As directed    Discharge to home Follow up with PCP in 7-10 days Follow up with Cardiology in one week for echocardiogram and PVD work up No driving while taking narcotics.   Increase activity slowly   Complete by: As directed      Allergies as of 11/22/2020      Reactions   Doxycycline    Metformin       Medication List    STOP taking these medications   baclofen 10 MG tablet Commonly known as: LIORESAL   HYDROcodone-acetaminophen 5-325 MG tablet Commonly known as: Norco Replaced by: HYDROcodone-acetaminophen 7.5-325 MG tablet     TAKE these medications   allopurinol 100 MG tablet Commonly known as: ZYLOPRIM Take 100 mg by mouth daily.   amLODipine 10 MG tablet Commonly known as: NORVASC Take 10 mg by mouth daily.   Bayer Aspirin EC Low Dose 81 MG EC tablet Generic drug: aspirin Take 81 mg by mouth daily.   calcium citrate-vitamin D 315-200 MG-UNIT tablet Commonly known as: CITRACAL+D Take 1 tablet by mouth daily.   carvedilol 6.25 MG tablet Commonly known as: COREG Take 6.25 mg by mouth 2 (two) times daily.   cetirizine 10 MG tablet Commonly known as: ZYRTEC Take 10 mg by mouth daily.   D-5000 125 MCG (5000 UT) Tabs Generic drug: Cholecalciferol Take 1 tablet by mouth daily.   docusate sodium 100 MG capsule Commonly known as: COLACE Take 200 mg by mouth 2 (two) times daily.   furosemide 20 MG tablet Commonly known as: LASIX Take 20 mg by mouth daily.   HYDROcodone-acetaminophen 7.5-325 MG tablet Commonly known as: NORCO Take 1-2 tablets by mouth every 6 (six) hours as needed for moderate pain or severe pain. Replaces: HYDROcodone-acetaminophen 5-325 MG tablet   insulin glargine 100 UNIT/ML Solostar Pen Commonly known  as: LANTUS Inject 25 Units into the skin at bedtime.   Linzess 145 MCG Caps capsule Generic drug: linaclotide Take 145 mcg by mouth daily.   liraglutide 18 MG/3ML Sopn Commonly known as: VICTOZA Inject 1.8 mg into the skin daily.   multivitamin Tabs tablet Take 1 tablet by mouth daily.   pantoprazole 20 MG tablet Commonly known as: PROTONIX Take 20 mg by mouth 2 (two) times daily.   rosuvastatin 20 MG tablet Commonly known as: Crestor Take 2 tablets (40 mg total) by mouth daily. What changed:   medication strength  how much to take   sertraline 100 MG tablet Commonly known as: ZOLOFT Take 200 mg by mouth daily.   tamsulosin 0.4 MG Caps capsule Commonly known as: FLOMAX Take 0.4 mg by mouth daily.      Allergies  Allergen Reactions  . Doxycycline   . Metformin     The results of significant diagnostics  from this hospitalization (including imaging, microbiology, ancillary and laboratory) are listed below for reference.    Significant Diagnostic Studies: DG Chest 2 View  Result Date: 11/21/2020 CLINICAL DATA:  Chest pain today, history coronary artery disease and TAVR EXAM: CHEST - 2 VIEW COMPARISON:  11/15/2020 FINDINGS: LEFT subclavian transvenous pacemaker with leads projecting at RIGHT atrium and RIGHT ventricle. Normal heart size post TAVR. Mediastinal contours and pulmonary vascularity normal. Atherosclerotic calcification aorta. Mild chronic bronchitic changes without pulmonary infiltrate, pleural effusion or pneumothorax. Bones demineralized. IMPRESSION: No acute abnormalities. Post TAVR and pacemaker. Electronically Signed   By: Lavonia Dana M.D.   On: 11/21/2020 18:42   DG Chest 2 View  Result Date: 11/15/2020 CLINICAL DATA:  Chest pain, fell EXAM: CHEST - 2 VIEW COMPARISON:  10/13/2020 FINDINGS: Frontal and lateral views of the chest demonstrate dual lead pacer unchanged. Aortic valve prosthesis again identified. The cardiac silhouette is unremarkable. No  airspace disease, effusion, or pneumothorax. There are no acute displaced fractures. IMPRESSION: 1. No acute intrathoracic process. Electronically Signed   By: Randa Ngo M.D.   On: 11/15/2020 22:47   CT Chest Wo Contrast  Result Date: 11/15/2020 CLINICAL DATA:  Acute pain due to trauma. Central chest pain. Pain radiates to the left side. EXAM: CT CHEST WITHOUT CONTRAST TECHNIQUE: Multidetector CT imaging of the chest was performed following the standard protocol without IV contrast. COMPARISON:  None. FINDINGS: Cardiovascular: Atherosclerotic changes are noted of the coronary arteries. The ascending thoracic aorta is borderline aneurysmal measuring approximately 4 cm. The patient is status post prior TAVR. The heart size is unremarkable. There are minimal atherosclerotic changes of the thoracic aorta. There is a left-sided pacemaker in place. Mediastinum/Nodes: -- No mediastinal lymphadenopathy. -- No hilar lymphadenopathy. -- No axillary lymphadenopathy. --findings are suspicious for right supraclavicular adenopathy with a dominant lymph node measuring approximately 1.5 cm (axial series 2, image 14. This finding is suboptimally evaluated in the absence of IV contrast. -- Normal thyroid gland where visualized. -  Unremarkable esophagus. Lungs/Pleura: Airways are patent. No pleural effusion, lobar consolidation, pneumothorax or pulmonary infarction. Upper Abdomen: Multiple calcifications are noted throughout the patient's liver, likely related to a prior granulomatous infection. There are postsurgical changes of the stomach that appear to be related to prior gastric bypass. Musculoskeletal: There is a nondisplaced fracture of the xiphoid process (sagittal series 6, image 96). There are few subcutaneous nodules involving the low anterior chest which likely represent small soft tissue contusions. IMPRESSION: 1. Nondisplaced fracture of the xiphoid process. 2. There are few subcutaneous nodules involving the  low anterior chest which likely represent small soft tissue contusions. 3. There are findings suspicious for right supraclavicular adenopathy with a dominant lymph node measuring approximately 1.5 cm. This finding is suboptimally evaluated in the absence of IV contrast. Follow-up with a nonemergent outpatient ultrasound is recommended. 4. Borderline aneurysmal dilatation of the ascending thoracic aorta measuring approximately 4 cm. Recommend annual imaging followup by CTA or MRA. This recommendation follows 2010 ACCF/AHA/AATS/ACR/ASA/SCA/SCAI/SIR/STS/SVM Guidelines for the Diagnosis and Management of Patients with Thoracic Aortic Disease. Circulation. 2010; 121: P329-J188. Aortic aneurysm NOS (ICD10-I71.9) Aortic Atherosclerosis (ICD10-I70.0). Electronically Signed   By: Constance Holster M.D.   On: 11/15/2020 23:55    Microbiology: Recent Results (from the past 240 hour(s))  Resp Panel by RT-PCR (Flu A&B, Covid) Nasopharyngeal Swab     Status: None   Collection Time: 11/21/20  9:44 PM   Specimen: Nasopharyngeal Swab; Nasopharyngeal(NP) swabs in vial transport medium  Result Value  Ref Range Status   SARS Coronavirus 2 by RT PCR NEGATIVE NEGATIVE Final    Comment: (NOTE) SARS-CoV-2 target nucleic acids are NOT DETECTED.  The SARS-CoV-2 RNA is generally detectable in upper respiratory specimens during the acute phase of infection. The lowest concentration of SARS-CoV-2 viral copies this assay can detect is 138 copies/mL. A negative result does not preclude SARS-Cov-2 infection and should not be used as the sole basis for treatment or other patient management decisions. A negative result may occur with  improper specimen collection/handling, submission of specimen other than nasopharyngeal swab, presence of viral mutation(s) within the areas targeted by this assay, and inadequate number of viral copies(<138 copies/mL). A negative result must be combined with clinical observations, patient  history, and epidemiological information. The expected result is Negative.  Fact Sheet for Patients:  EntrepreneurPulse.com.au  Fact Sheet for Healthcare Providers:  IncredibleEmployment.be  This test is no t yet approved or cleared by the [place] FDA and  has been authorized for detection and/or diagnosis of SARS-CoV-2 by FDA under an Emergency Use Authorization (EUA). This EUA will remain  in effect (meaning this test can be used) for the duration of the COVID-19 declaration under Section 564(b)(1) of the Act, 21 U.S.C.section 360bbb-3(b)(1), unless the authorization is terminated  or revoked sooner.       Influenza A by PCR NEGATIVE NEGATIVE Final   Influenza B by PCR NEGATIVE NEGATIVE Final    Comment: (NOTE) The Xpert Xpress SARS-CoV-2/FLU/RSV plus assay is intended as an aid in the diagnosis of influenza from Nasopharyngeal swab specimens and should not be used as a sole basis for treatment. Nasal washings and aspirates are unacceptable for Xpert Xpress SARS-CoV-2/FLU/RSV testing.  Fact Sheet for Patients: EntrepreneurPulse.com.au  Fact Sheet for Healthcare Providers: IncredibleEmployment.be  This test is not yet approved or cleared by the [place] FDA and has been authorized for detection and/or diagnosis of SARS-CoV-2 by FDA under an Emergency Use Authorization (EUA). This EUA will remain in effect (meaning this test can be used) for the duration of the COVID-19 declaration under Section 564(b)(1) of the Act, 21 U.S.C. section 360bbb-3(b)(1), unless the authorization is terminated or revoked.  Performed at [facility], [address] [place], [place] [address]      Labs: Basic Metabolic Panel: Recent Labs  Lab 11/15/20 2019 11/19/20 1439 11/21/20 1724 11/22/20 0417  NA 139 137 135 138  K 4.8 4.8 4.8 4.1  CL 103 104 104 107  CO2 23 18* 23 22  GLUCOSE 121* 148*  121* 164*  BUN 61* 39* 49* 44*  CREATININE 1.48* 1.07 1.22 1.21  CALCIUM 8.7* 9.2 8.7* 8.8*  MG  --   --  2.1  --    Liver Function Tests: Recent Labs  Lab 11/21/20 1724  AST 35  ALT 37  ALKPHOS 82  BILITOT 0.6  PROT 6.9  ALBUMIN 3.7   No results for input(s): LIPASE, AMYLASE in the last 168 hours. No results for input(s): AMMONIA in the last 168 hours. CBC: Recent Labs  Lab 11/15/20 2019 11/19/20 1439 11/21/20 1724 11/22/20 0417  WBC 3.6* 3.6 3.5* 3.3*  NEUTROABS  --   --  2.0  --   HGB 11.0* 11.7* 10.2* 11.1*  HCT 32.4* 34.3* 30.4* 31.6*  MCV 95.9 96 96.8 94.9  PLT 106* 113* 93* 92*   Cardiac Enzymes: No results for input(s): CKTOTAL, CKMB, CKMBINDEX, TROPONINI in the last 168 hours. BNP: BNP (last 3 results) No results for  input(s): BNP in the last 8760 hours.  ProBNP (last 3 results) No results for input(s): PROBNP in the last 8760 hours.  CBG: Recent Labs  Lab 11/21/20 2244 11/22/20 0833 11/22/20 1229  GLUCAP 198* 208* 223*    Principal Problem:   Chest pain Active Problems:   Diabetes mellitus without complication (HCC)   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   Peripheral vascular disease, unspecified (HCC) stents to popliteal arteries many years ago   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   Hypertension associated with diabetes ([place])   Hyperlipidemia associated with type 2 diabetes mellitus ([place])   History of GI bleed   CAD (coronary artery disease)   Thrombocytopenia ([place])   Time coordinating discharge: 38 minutes.  Signed:        Shahed Yeoman, DO [facility]  11/22/2020, 6:15 PM

## 2020-11-22 NOTE — Progress Notes (Addendum)
Received care of pt sitting up on gurney in NAD at this time. Alert and Oriented x4, respirations even and non-labored, states he is having pain to his chest, pt points to xiphoid process, which is fractured. Assessment completed and documented - PRN Tylenol 325mg  (2 tabs given per [facility]). Call bell within reach, will continue to monitor.

## 2020-11-23 LAB — HEMOGLOBIN A1C
Hgb A1c MFr Bld: 7.5 % — ABNORMAL HIGH (ref 4.8–5.6)
Mean Plasma Glucose: 168.55 mg/dL

## 2020-11-26 ENCOUNTER — Ambulatory Visit (INDEPENDENT_AMBULATORY_CARE_PROVIDER_SITE_OTHER): Payer: Medicare Other | Admitting: Vascular Surgery

## 2020-11-26 ENCOUNTER — Encounter (INDEPENDENT_AMBULATORY_CARE_PROVIDER_SITE_OTHER): Payer: Self-pay | Admitting: Vascular Surgery

## 2020-11-26 ENCOUNTER — Other Ambulatory Visit: Payer: Self-pay

## 2020-11-26 VITALS — BP 127/65 | HR 62 | Ht 72.0 in | Wt 249.0 lb

## 2020-11-26 DIAGNOSIS — J449 Chronic obstructive pulmonary disease, unspecified: Secondary | ICD-10-CM | POA: Insufficient documentation

## 2020-11-26 DIAGNOSIS — I739 Peripheral vascular disease, unspecified: Secondary | ICD-10-CM | POA: Insufficient documentation

## 2020-11-26 DIAGNOSIS — I10 Essential (primary) hypertension: Secondary | ICD-10-CM

## 2020-11-26 DIAGNOSIS — I7121 Aneurysm of the ascending aorta, without rupture: Secondary | ICD-10-CM

## 2020-11-26 DIAGNOSIS — M109 Gout, unspecified: Secondary | ICD-10-CM | POA: Insufficient documentation

## 2020-11-26 DIAGNOSIS — M7989 Other specified soft tissue disorders: Secondary | ICD-10-CM | POA: Insufficient documentation

## 2020-11-26 DIAGNOSIS — E1169 Type 2 diabetes mellitus with other specified complication: Secondary | ICD-10-CM | POA: Diagnosis not present

## 2020-11-26 DIAGNOSIS — I872 Venous insufficiency (chronic) (peripheral): Secondary | ICD-10-CM | POA: Insufficient documentation

## 2020-11-26 DIAGNOSIS — I219 Acute myocardial infarction, unspecified: Secondary | ICD-10-CM | POA: Insufficient documentation

## 2020-11-26 DIAGNOSIS — E119 Type 2 diabetes mellitus without complications: Secondary | ICD-10-CM

## 2020-11-26 DIAGNOSIS — S7011XA Contusion of right thigh, initial encounter: Secondary | ICD-10-CM | POA: Insufficient documentation

## 2020-11-26 DIAGNOSIS — I712 Thoracic aortic aneurysm, without rupture: Secondary | ICD-10-CM | POA: Diagnosis not present

## 2020-11-26 DIAGNOSIS — N189 Chronic kidney disease, unspecified: Secondary | ICD-10-CM | POA: Insufficient documentation

## 2020-11-26 DIAGNOSIS — I8390 Asymptomatic varicose veins of unspecified lower extremity: Secondary | ICD-10-CM | POA: Insufficient documentation

## 2020-11-26 DIAGNOSIS — E785 Hyperlipidemia, unspecified: Secondary | ICD-10-CM | POA: Insufficient documentation

## 2020-11-26 DIAGNOSIS — N529 Male erectile dysfunction, unspecified: Secondary | ICD-10-CM | POA: Insufficient documentation

## 2020-11-26 DIAGNOSIS — R0600 Dyspnea, unspecified: Secondary | ICD-10-CM | POA: Insufficient documentation

## 2020-11-26 DIAGNOSIS — E559 Vitamin D deficiency, unspecified: Secondary | ICD-10-CM | POA: Insufficient documentation

## 2020-11-26 DIAGNOSIS — D649 Anemia, unspecified: Secondary | ICD-10-CM | POA: Insufficient documentation

## 2020-11-26 DIAGNOSIS — G4730 Sleep apnea, unspecified: Secondary | ICD-10-CM | POA: Insufficient documentation

## 2020-11-26 DIAGNOSIS — K219 Gastro-esophageal reflux disease without esophagitis: Secondary | ICD-10-CM | POA: Insufficient documentation

## 2020-11-26 DIAGNOSIS — Z87448 Personal history of other diseases of urinary system: Secondary | ICD-10-CM | POA: Insufficient documentation

## 2020-11-26 NOTE — Assessment & Plan Note (Signed)
He had an uninfused CT scan of the chest which I have independently reviewed on December 3.  This demonstrates a 4.0 cm a sending thoracic aorta.  This is just beyond what is a valve replacement that is visible on the CT.  He is also had a pacemaker.  This is an uninfused scan so no note can be made of the flow in the area.   This is not surprising given his AVR done in the area previously.  This is not something that needs follow by [place] at this time.  If it enlarges to >6 cm, he should contact his cardiothoracic surgeon.

## 2020-11-26 NOTE — Assessment & Plan Note (Signed)
lipid control important in reducing the progression of atherosclerotic disease. Continue statin therapy  

## 2020-11-26 NOTE — Assessment & Plan Note (Signed)
blood glucose control important in reducing the progression of atherosclerotic disease. Also, involved in wound healing. On appropriate medications.  

## 2020-11-26 NOTE — Assessment & Plan Note (Signed)
blood pressure control important in reducing the progression of atherosclerotic disease. On appropriate oral medications.  

## 2020-11-26 NOTE — Progress Notes (Signed)
Patient ID: Nathan Singleton, male   DOB: 1941-01-12, 79 y.o.   MRN: [id number]  Chief Complaint  Patient presents with  . New Patient (Initial Visit)    1 week [facility] F/U TAA    HPI Nathan Singleton is a 79 y.o. male.  I am asked to see the patient by Dr. Beather Arbour in the [facility] ER for evaluation of a dilated ascending thoracic aorta.  He was in the emergency room for chest Singleton and other reasons.  He had an uninfused CT scan of the chest which I have independently reviewed on December 3.  This demonstrates a 4.0 cm a sending thoracic aorta.  This is just beyond what is a valve replacement that is visible on the CT.  He is also had a pacemaker.  This is an uninfused scan so no note can be made of the flow in the area.     Past Medical History:  Diagnosis Date  . Aneurysm (arteriovenous) of coronary vessels   . Arthritis   . Ascending aortic aneurysm ([place])   . CAD (coronary artery disease)   . Diabetes mellitus without complication ([place])   . History of GI bleed   . Hyperlipidemia associated with type 2 diabetes mellitus ([place])   . Hypertension associated with diabetes ([place])   . Presence of permanent cardiac pacemaker   . PVD (peripheral vascular disease) ([place])   . S/P TAVR (transcatheter aortic valve replacement)     Past Surgical History:  Procedure Laterality Date  . Ankle repaired Right   . Carpal Tunnel repaired Left   . CATARACT EXTRACTION Bilateral   . COLONOSCOPY    . Copillar implant     . DG CHOLECYSTOGRAPHY GALL BLADDER ([place] HX)    . GASTRIC BYPASS    . heart valve replaced    . PACEMAKER GENERATOR CHANGE    . REPLACEMENT TOTAL KNEE BILATERAL    . RTC    . STENT PLACE LEFT URETER ([facility] HX) Right    Right Leg  . Toe nail removed Bilateral      Family History  Problem Relation Age of Onset  . Heart disease Mother   . Alzheimer's disease Mother   . Heart disease Father   . Diabetes Father      Social History   Tobacco Use  . Smoking status: Former Research scientist (life sciences)  .  Smokeless tobacco: Never Used  Substance Use Topics  . Alcohol use: Not Currently  . Drug use: Not Currently     Allergies  Allergen Reactions  . Atorvastatin Other (See Comments)    "bad for kidneys" Cramping   . Celecoxib Other (See Comments)    Other reaction(s): Other (See Comments) Kidney Problem Kidney Problem   . Dicyclomine Hcl Other (See Comments)    Stomach cramps  . Doxycycline   . Metformin     Current Outpatient Medications  Medication Sig Dispense Refill  . allopurinol (ZYLOPRIM) 100 MG tablet Take 100 mg by mouth daily.    Marland Kitchen amLODipine (NORVASC) 10 MG tablet Take 10 mg by mouth daily.    Marland Kitchen BAYER ASPIRIN EC LOW DOSE 81 MG EC tablet Take 81 mg by mouth daily.    . calcium citrate-vitamin D (CITRACAL+D) 315-200 MG-UNIT tablet Take 1 tablet by mouth daily.    . carvedilol (COREG) 6.25 MG tablet Take 6.25 mg by mouth 2 (two) times daily.    . cetirizine (ZYRTEC) 10 MG tablet Take 10 mg by mouth daily.    Marland Kitchen  D-5000 125 MCG (5000 UT) TABS Take 1 tablet by mouth daily.    Marland Kitchen docusate sodium (COLACE) 100 MG capsule Take 200 mg by mouth 2 (two) times daily.    . furosemide (LASIX) 20 MG tablet Take 20 mg by mouth daily.    Marland Kitchen HYDROcodone-acetaminophen (NORCO) 7.5-325 MG tablet Take 1-2 tablets by mouth every 6 (six) hours as needed for moderate Singleton or severe Singleton. 20 tablet 0  . insulin glargine (LANTUS) 100 UNIT/ML Solostar Pen Inject 25 Units into the skin at bedtime.    Marland Kitchen LINZESS 145 MCG CAPS capsule Take 145 mcg by mouth daily.    Marland Kitchen liraglutide (VICTOZA) 18 MG/3ML SOPN Inject 1.8 mg into the skin daily.    . multivitamin (ONE-A-DAY MEN'S) TABS tablet Take 1 tablet by mouth daily.    . pantoprazole (PROTONIX) 20 MG tablet Take 20 mg by mouth 2 (two) times daily.    . rosuvastatin (CRESTOR) 20 MG tablet Take 2 tablets (40 mg total) by mouth daily. 60 tablet 0  . sertraline (ZOLOFT) 100 MG tablet Take 200 mg by mouth daily.    . tamsulosin (FLOMAX) 0.4 MG CAPS capsule  Take 0.4 mg by mouth daily.     No current facility-administered medications for this visit.      REVIEW OF SYSTEMS (Negative unless checked)  Constitutional: [] Weight loss  [] Fever  [] Chills Cardiac: [x] Chest Singleton   [] Chest pressure   [] Palpitations   [] Shortness of breath when laying flat   [] Shortness of breath at rest   [x] Shortness of breath with exertion. Vascular:  [] Singleton in legs with walking   [] Singleton in legs at rest   [] Singleton in legs when laying flat   [] Claudication   [] Singleton in feet when walking  [] Singleton in feet at rest  [] Singleton in feet when laying flat   [] History of DVT   [] Phlebitis   [] Swelling in legs   [] Varicose veins   [] Non-healing ulcers Pulmonary:   [] Uses home oxygen   [] Productive cough   [] Hemoptysis   [] Wheeze  [] COPD   [] Asthma Neurologic:  [] Dizziness  [] Blackouts   [] Seizures   [] History of stroke   [] History of TIA  [] Aphasia   [] Temporary blindness   [] Dysphagia   [] Weakness or numbness in arms   [] Weakness or numbness in legs Musculoskeletal:  [x] Arthritis   [] Joint swelling   [x] Joint Singleton   [] Low back Singleton Hematologic:  [] Easy bruising  [] Easy bleeding   [] Hypercoagulable state   [] Anemic  [] Hepatitis Gastrointestinal:  [] Blood in stool   [] Vomiting blood  [] Gastroesophageal reflux/heartburn   [] Abdominal Singleton Genitourinary:  [x] Chronic kidney disease   [] Difficult urination  [] Frequent urination  [] Burning with urination   [] Hematuria Skin:  [] Rashes   [] Ulcers   [] Wounds Psychological:  [] History of anxiety   []  History of major depression.    Physical Exam BP 127/65   Pulse 62   Ht 6' (1.829 m)   Wt 249 lb (112.9 kg)   BMI 33.77 kg/m  Gen:  WD/WN, NAD.  Head: [place]/AT, No temporalis wasting. Ear/Nose/Throat: Hearing grossly intact, nares w/o erythema or drainage, oropharynx w/o Erythema/Exudate Eyes: Conjunctiva clear, sclera non-icteric  Neck: trachea midline.  No JVD.  Pulmonary:  Good air movement, respirations not labored, no use of accessory  muscles  Cardiac: irregular Vascular:  Vessel Right Left  Radial Palpable Palpable  Gastrointestinal:. No masses, surgical incisions, or scars. Musculoskeletal: M/S 5/5 throughout.  Extremities without ischemic changes.  No deformity or atrophy. Mild LE edema. Neurologic: Sensation grossly intact in extremities.  Symmetrical.  Speech is fluent. Motor exam as listed above. Psychiatric: Judgment intact, Mood & affect appropriate for pt's clinical situation. Dermatologic: No rashes or ulcers noted.  No cellulitis or open wounds.    Radiology DG Chest 2 View  Result Date: 11/21/2020 CLINICAL DATA:  Chest Singleton today, history coronary artery disease and TAVR EXAM: CHEST - 2 VIEW COMPARISON:  11/15/2020 FINDINGS: LEFT subclavian transvenous pacemaker with leads projecting at RIGHT atrium and RIGHT ventricle. Normal heart size post TAVR. Mediastinal contours and pulmonary vascularity normal. Atherosclerotic calcification aorta. Mild chronic bronchitic changes without pulmonary infiltrate, pleural effusion or pneumothorax. Bones demineralized. IMPRESSION: No acute abnormalities. Post TAVR and pacemaker. Electronically Signed   By: Lavonia Dana M.D.   On: 11/21/2020 18:42   DG Chest 2 View  Result Date: 11/15/2020 CLINICAL DATA:  Chest Singleton, fell EXAM: CHEST - 2 VIEW COMPARISON:  10/13/2020 FINDINGS: Frontal and lateral views of the chest demonstrate dual lead pacer unchanged. Aortic valve prosthesis again identified. The cardiac silhouette is unremarkable. No airspace disease, effusion, or pneumothorax. There are no acute displaced fractures. IMPRESSION: 1. No acute intrathoracic process. Electronically Signed   By: Randa Ngo M.D.   On: 11/15/2020 22:47   CT Chest Wo Contrast  Result Date: 11/15/2020 CLINICAL DATA:  Acute Singleton due to trauma. Central chest Singleton. Singleton radiates to the left side. EXAM: CT CHEST WITHOUT CONTRAST TECHNIQUE: Multidetector CT  imaging of the chest was performed following the standard protocol without IV contrast. COMPARISON:  None. FINDINGS: Cardiovascular: Atherosclerotic changes are noted of the coronary arteries. The ascending thoracic aorta is borderline aneurysmal measuring approximately 4 cm. The patient is status post prior TAVR. The heart size is unremarkable. There are minimal atherosclerotic changes of the thoracic aorta. There is a left-sided pacemaker in place. Mediastinum/Nodes: -- No mediastinal lymphadenopathy. -- No hilar lymphadenopathy. -- No axillary lymphadenopathy. --findings are suspicious for right supraclavicular adenopathy with a dominant lymph node measuring approximately 1.5 cm (axial series 2, image 14. This finding is suboptimally evaluated in the absence of IV contrast. -- Normal thyroid gland where visualized. -  Unremarkable esophagus. Lungs/Pleura: Airways are patent. No pleural effusion, lobar consolidation, pneumothorax or pulmonary infarction. Upper Abdomen: Multiple calcifications are noted throughout the patient's liver, likely related to a prior granulomatous infection. There are postsurgical changes of the stomach that appear to be related to prior gastric bypass. Musculoskeletal: There is a nondisplaced fracture of the xiphoid process (sagittal series 6, image 96). There are few subcutaneous nodules involving the low anterior chest which likely represent small soft tissue contusions. IMPRESSION: 1. Nondisplaced fracture of the xiphoid process. 2. There are few subcutaneous nodules involving the low anterior chest which likely represent small soft tissue contusions. 3. There are findings suspicious for right supraclavicular adenopathy with a dominant lymph node measuring approximately 1.5 cm. This finding is suboptimally evaluated in the absence of IV contrast. Follow-up with a nonemergent outpatient ultrasound is recommended. 4. Borderline aneurysmal dilatation of the ascending thoracic aorta  measuring approximately 4 cm. Recommend annual imaging followup by CTA or MRA. This recommendation follows 2010 ACCF/AHA/AATS/ACR/ASA/SCA/SCAI/SIR/STS/SVM Guidelines for the Diagnosis and Management of Patients with Thoracic Aortic Disease. Circulation. 2010; 121: N235-T732. Aortic aneurysm NOS (ICD10-I71.9) Aortic Atherosclerosis (ICD10-I70.0). Electronically Signed   By: Constance Holster M.D.   On: 11/15/2020 23:55  Labs Recent Results (from the past 2160 hour(s))  CBG monitoring, ED     Status: Abnormal   Collection Time: 10/13/20  7:13 PM  Result Value Ref Range   Glucose-Capillary 328 (H) 70 - 99 mg/dL    Comment: Glucose reference range applies only to samples taken after fasting for at least 8 hours.  CBC with Differential     Status: Abnormal   Collection Time: 10/13/20  7:30 PM  Result Value Ref Range   WBC 5.5 4.0 - 10.5 K/uL   RBC 3.45 (L) 4.22 - 5.81 MIL/uL   Hemoglobin 11.3 (L) 13.0 - 17.0 g/dL   HCT 33.0 (L) 39.0 - 52.0 %   MCV 95.7 80.0 - 100.0 fL   MCH 32.8 26.0 - 34.0 pg   MCHC 34.2 30.0 - 36.0 g/dL   RDW 13.2 11.5 - 15.5 %   Platelets 114 (L) 150 - 400 K/uL    Comment: REPEATED TO VERIFY Immature Platelet Fraction may be clinically indicated, consider ordering this additional test [id number]    nRBC 0.0 0.0 - 0.2 %   Neutrophils Relative % 62 %   Neutro Abs 3.4 1.7 - 7.7 K/uL   Lymphocytes Relative 27 %   Lymphs Abs 1.5 0.7 - 4.0 K/uL   Monocytes Relative 6 %   Monocytes Absolute 0.3 0.1 - 1.0 K/uL   Eosinophils Relative 4 %   Eosinophils Absolute 0.2 0.0 - 0.5 K/uL   Basophils Relative 1 %   Basophils Absolute 0.0 0.0 - 0.1 K/uL   Immature Granulocytes 0 %   Abs Immature Granulocytes 0.01 0.00 - 0.07 K/uL    Comment: Performed at [facility], [address] [place], [place] [address]  Comprehensive metabolic panel     Status: Abnormal   Collection Time: 10/13/20  7:30 PM  Result Value Ref Range   Sodium 136 135 - 145 mmol/L   Potassium  4.4 3.5 - 5.1 mmol/L   Chloride 102 98 - 111 mmol/L   CO2 23 22 - 32 mmol/L   Glucose, Bld 334 (H) 70 - 99 mg/dL    Comment: Glucose reference range applies only to samples taken after fasting for at least 8 hours.   BUN 56 (H) 8 - 23 mg/dL   Creatinine, Ser 1.62 (H) 0.61 - 1.24 mg/dL   Calcium 8.5 (L) 8.9 - 10.3 mg/dL   Total Protein 7.3 6.5 - 8.1 g/dL   Albumin 4.3 3.5 - 5.0 g/dL   AST 36 15 - 41 U/L   ALT 46 (H) 0 - 44 U/L   Alkaline Phosphatase 125 38 - 126 U/L   Total Bilirubin 0.8 0.3 - 1.2 mg/dL   GFR, Estimated 43 (L) >60 mL/min    Comment: (NOTE) Calculated using the CKD-EPI Creatinine Equation (2021)    Anion gap 11 5 - 15    Comment: Performed at [facility], [address] [place], [place] [address]  Urinalysis, Complete w Microscopic     Status: Abnormal   Collection Time: 10/13/20  7:30 PM  Result Value Ref Range   Color, Urine STRAW (A) YELLOW   APPearance CLEAR (A) CLEAR   Specific Gravity, Urine 1.007 1.005 - 1.030   pH 5.0 5.0 - 8.0   Glucose, UA >=500 (A) NEGATIVE mg/dL   Hgb urine dipstick NEGATIVE NEGATIVE   Bilirubin Urine NEGATIVE NEGATIVE   Ketones, ur NEGATIVE NEGATIVE mg/dL   Protein, ur NEGATIVE NEGATIVE mg/dL   Nitrite NEGATIVE NEGATIVE  Leukocytes,Ua NEGATIVE NEGATIVE   WBC, UA 0-5 0 - 5 WBC/hpf   Bacteria, UA NONE SEEN NONE SEEN   Squamous Epithelial / LPF NONE SEEN 0 - 5    Comment: Performed at [facility], [address] [place], [place] [address]  CBG monitoring, ED     Status: Abnormal   Collection Time: 10/13/20 11:11 PM  Result Value Ref Range   Glucose-Capillary 237 (H) 70 - 99 mg/dL    Comment: Glucose reference range applies only to samples taken after fasting for at least 8 hours.  Basic metabolic panel     Status: Abnormal   Collection Time: 11/15/20  8:19 PM  Result Value Ref Range   Sodium 139 135 - 145 mmol/L   Potassium 4.8 3.5 - 5.1 mmol/L   Chloride 103 98 - 111 mmol/L   CO2 23 22 - 32 mmol/L    Glucose, Bld 121 (H) 70 - 99 mg/dL    Comment: Glucose reference range applies only to samples taken after fasting for at least 8 hours.   BUN 61 (H) 8 - 23 mg/dL   Creatinine, Ser 1.48 (H) 0.61 - 1.24 mg/dL   Calcium 8.7 (L) 8.9 - 10.3 mg/dL   GFR, Estimated 48 (L) >60 mL/min    Comment: (NOTE) Calculated using the CKD-EPI Creatinine Equation (2021)    Anion gap 13 5 - 15    Comment: Performed at [facility], [address] [place], [place] [address]  CBC     Status: Abnormal   Collection Time: 11/15/20  8:19 PM  Result Value Ref Range   WBC 3.6 (L) 4.0 - 10.5 K/uL   RBC 3.38 (L) 4.22 - 5.81 MIL/uL   Hemoglobin 11.0 (L) 13.0 - 17.0 g/dL   HCT 32.4 (L) 39.0 - 52.0 %   MCV 95.9 80.0 - 100.0 fL   MCH 32.5 26.0 - 34.0 pg   MCHC 34.0 30.0 - 36.0 g/dL   RDW 13.2 11.5 - 15.5 %   Platelets 106 (L) 150 - 400 K/uL    Comment: Immature Platelet Fraction may be clinically indicated, consider ordering this additional test [id number]    nRBC 0.0 0.0 - 0.2 %    Comment: Performed at [facility], [address] [place], [place] [address]  Troponin I (High Sensitivity)     Status: None   Collection Time: 11/15/20  8:19 PM  Result Value Ref Range   Troponin I (High Sensitivity) 13 <18 ng/L    Comment: (NOTE) Elevated high sensitivity troponin I (hsTnI) values and significant  changes across serial measurements may suggest ACS but many other  chronic and acute conditions are known to elevate hsTnI results.  Refer to the "Links" section for chest Singleton algorithms and additional  guidance. Performed at [facility], [place], [place] [address]   Troponin I (High Sensitivity)     Status: None   Collection Time: 11/16/20  2:01 AM  Result Value Ref Range   Troponin I (High Sensitivity) 13 <18 ng/L    Comment: (NOTE) Elevated high sensitivity troponin I (hsTnI) values and significant  changes across serial measurements may suggest ACS but many  other  chronic and acute conditions are known to elevate hsTnI results.  Refer to the "Links" section for chest Singleton algorithms and additional  guidance. Performed at [facility], [address] [place],  [address]   Lipid panel     Status: Abnormal   Collection  Time: 11/19/20  2:39 PM  Result Value Ref Range   Cholesterol, Total 167 100 - 199 mg/dL   Triglycerides 83 0 - 149 mg/dL   HDL 51 >39 mg/dL   VLDL Cholesterol Cal 16 5 - 40 mg/dL   LDL Chol Calc (NIH) 100 (H) 0 - 99 mg/dL   Chol/HDL Ratio 3.3 0.0 - 5.0 ratio    Comment:                                   T. Chol/HDL Ratio                                             Men  Women                               1/2 Avg.Risk  3.4    3.3                                   Avg.Risk  5.0    4.4                                2X Avg.Risk  9.6    7.1                                3X Avg.Risk 23.4   40.9   Basic metabolic panel     Status: Abnormal   Collection Time: 11/19/20  2:39 PM  Result Value Ref Range   Glucose 148 (H) 65 - 99 mg/dL   BUN 39 (H) 8 - 27 mg/dL   Creatinine, Ser 1.07 0.76 - 1.27 mg/dL   GFR calc non Af Amer 66 >59 mL/min/1.73   GFR calc Af Amer 76 >59 mL/min/1.73    Comment: **In accordance with recommendations from the NKF-ASN Task force,**   [facility] is in the process of updating its eGFR calculation to the   2021 CKD-EPI creatinine equation that estimates kidney function   without a race variable.    BUN/Creatinine Ratio 36 (H) 10 - 24   Sodium 137 134 - 144 mmol/L   Potassium 4.8 3.5 - 5.2 mmol/L   Chloride 104 96 - 106 mmol/L   CO2 18 (L) 20 - 29 mmol/L   Calcium 9.2 8.6 - 10.2 mg/dL  CBC     Status: Abnormal   Collection Time: 11/19/20  2:39 PM  Result Value Ref Range   WBC 3.6 3.4 - 10.8 x10E3/uL   RBC 3.59 (L) 4.14 - 5.80 x10E6/uL   Hemoglobin 11.7 (L) 13.0 - 17.7 g/dL   Hematocrit 34.3 (L) 37.5 - 51.0 %   MCV 96 79 - 97 fL   MCH 32.6 26.6 - 33.0 pg   MCHC 34.1 31.5 - 35.7  g/dL   RDW 13.1 11.6 - 15.4 %   Platelets 113 (L) 150 - 450 x10E3/uL  Troponin I (High Sensitivity)     Status: None   Collection Time: 11/21/20  5:24 PM  Result Value Ref Range   Troponin I (High Sensitivity) 9 <  18 ng/L    Comment: (NOTE) Elevated high sensitivity troponin I (hsTnI) values and significant  changes across serial measurements may suggest ACS but many other  chronic and acute conditions are known to elevate hsTnI results.  Refer to the "Links" section for chest Singleton algorithms and additional  guidance. Performed at [facility], [address] [place], [place] [address]   CBC with Differential/Platelet     Status: Abnormal   Collection Time: 11/21/20  5:24 PM  Result Value Ref Range   WBC 3.5 (L) 4.0 - 10.5 K/uL   RBC 3.14 (L) 4.22 - 5.81 MIL/uL   Hemoglobin 10.2 (L) 13.0 - 17.0 g/dL   HCT 08.8 (L) 90.1 - 38.5 %   MCV 96.8 80.0 - 100.0 fL   MCH 32.5 26.0 - 34.0 pg   MCHC 33.6 30.0 - 36.0 g/dL   RDW 52.8 94.2 - 40.6 %   Platelets 93 (L) 150 - 400 K/uL    Comment: Immature Platelet Fraction may be clinically indicated, consider ordering this additional test [id number]    nRBC 0.0 0.0 - 0.2 %   Neutrophils Relative % 56 %   Neutro Abs 2.0 1.7 - 7.7 K/uL   Lymphocytes Relative 29 %   Lymphs Abs 1.0 0.7 - 4.0 K/uL   Monocytes Relative 8 %   Monocytes Absolute 0.3 0.1 - 1.0 K/uL   Eosinophils Relative 6 %   Eosinophils Absolute 0.2 0.0 - 0.5 K/uL   Basophils Relative 1 %   Basophils Absolute 0.0 0.0 - 0.1 K/uL   Immature Granulocytes 0 %   Abs Immature Granulocytes 0.00 0.00 - 0.07 K/uL    Comment: Performed at [facility], [address] [place], [place] [address]  Comprehensive metabolic panel     Status: Abnormal   Collection Time: 11/21/20  5:24 PM  Result Value Ref Range   Sodium 135 135 - 145 mmol/L   Potassium 4.8 3.5 - 5.1 mmol/L   Chloride 104 98 - 111 mmol/L   CO2 23 22 - 32 mmol/L   Glucose, Bld 121 (H) 70 - 99 mg/dL     Comment: Glucose reference range applies only to samples taken after fasting for at least 8 hours.   BUN 49 (H) 8 - 23 mg/dL   Creatinine, Ser 4.49 0.61 - 1.24 mg/dL   Calcium 8.7 (L) 8.9 - 10.3 mg/dL   Total Protein 6.9 6.5 - 8.1 g/dL   Albumin 3.7 3.5 - 5.0 g/dL   AST 35 15 - 41 U/L   ALT 37 0 - 44 U/L   Alkaline Phosphatase 82 38 - 126 U/L   Total Bilirubin 0.6 0.3 - 1.2 mg/dL   GFR, Estimated >74 >58 mL/min    Comment: (NOTE) Calculated using the CKD-EPI Creatinine Equation (2021)    Anion gap 8 5 - 15    Comment: Performed at [facility], [address] [place], [place] [address]  Magnesium     Status: None   Collection Time: 11/21/20  5:24 PM  Result Value Ref Range   Magnesium 2.1 1.7 - 2.4 mg/dL    Comment: Performed at [facility], [address] [place], [place] [address]  Troponin I (High Sensitivity)     Status: None   Collection Time: 11/21/20  7:24 PM  Result Value Ref Range   Troponin I (High Sensitivity) 9 <18 ng/L    Comment: (NOTE) Elevated high sensitivity troponin I (hsTnI) values and significant  changes  across serial measurements may suggest ACS but many other  chronic and acute conditions are known to elevate hsTnI results.  Refer to the "Links" section for chest Singleton algorithms and additional  guidance. Performed at [facility], [address] [place], [place] [address]   Resp Panel by RT-PCR (Flu A&B, Covid) Nasopharyngeal Swab     Status: None   Collection Time: 11/21/20  9:44 PM   Specimen: Nasopharyngeal Swab; Nasopharyngeal(NP) swabs in vial transport medium  Result Value Ref Range   SARS Coronavirus 2 by RT PCR NEGATIVE NEGATIVE    Comment: (NOTE) SARS-CoV-2 target nucleic acids are NOT DETECTED.  The SARS-CoV-2 RNA is generally detectable in upper respiratory specimens during the acute phase of infection. The lowest concentration of SARS-CoV-2 viral copies this assay can detect is 138 copies/mL. A negative  result does not preclude SARS-Cov-2 infection and should not be used as the sole basis for treatment or other patient management decisions. A negative result may occur with  improper specimen collection/handling, submission of specimen other than nasopharyngeal swab, presence of viral mutation(s) within the areas targeted by this assay, and inadequate number of viral copies(<138 copies/mL). A negative result must be combined with clinical observations, patient history, and epidemiological information. The expected result is Negative.  Fact Sheet for Patients:  EntrepreneurPulse.com.au  Fact Sheet for Healthcare Providers:  IncredibleEmployment.be  This test is no t yet approved or cleared by the [place] FDA and  has been authorized for detection and/or diagnosis of SARS-CoV-2 by FDA under an Emergency Use Authorization (EUA). This EUA will remain  in effect (meaning this test can be used) for the duration of the COVID-19 declaration under Section 564(b)(1) of the Act, 21 U.S.C.section 360bbb-3(b)(1), unless the authorization is terminated  or revoked sooner.       Influenza A by PCR NEGATIVE NEGATIVE   Influenza B by PCR NEGATIVE NEGATIVE    Comment: (NOTE) The Xpert Xpress SARS-CoV-2/FLU/RSV plus assay is intended as an aid in the diagnosis of influenza from Nasopharyngeal swab specimens and should not be used as a sole basis for treatment. Nasal washings and aspirates are unacceptable for Xpert Xpress SARS-CoV-2/FLU/RSV testing.  Fact Sheet for Patients: EntrepreneurPulse.com.au  Fact Sheet for Healthcare Providers: IncredibleEmployment.be  This test is not yet approved or cleared by the [place] FDA and has been authorized for detection and/or diagnosis of SARS-CoV-2 by FDA under an Emergency Use Authorization (EUA). This EUA will remain in effect (meaning this test can be used) for the  duration of the COVID-19 declaration under Section 564(b)(1) of the Act, 21 U.S.C. section 360bbb-3(b)(1), unless the authorization is terminated or revoked.  Performed at [facility], [address] [place], [place] [address]   Type and screen Ordered by PROVIDER DEFAULT     Status: None   Collection Time: 11/21/20  9:44 PM  Result Value Ref Range   ABO/RH(D) O NEG    Antibody Screen NEG    Sample Expiration      11/24/2020,2359 Performed at [facility], [address] [place], [place] [address]   CBG monitoring, ED     Status: Abnormal   Collection Time: 11/21/20 10:44 PM  Result Value Ref Range   Glucose-Capillary 198 (H) 70 - 99 mg/dL    Comment: Glucose reference range applies only to samples taken after fasting for at least 8 hours.  CBC     Status: Abnormal   Collection Time: 11/22/20  4:17 AM  Result Value  Ref Range   WBC 3.3 (L) 4.0 - 10.5 K/uL   RBC 3.33 (L) 4.22 - 5.81 MIL/uL   Hemoglobin 11.1 (L) 13.0 - 17.0 g/dL   HCT 31.6 (L) 39.0 - 52.0 %   MCV 94.9 80.0 - 100.0 fL   MCH 33.3 26.0 - 34.0 pg   MCHC 35.1 30.0 - 36.0 g/dL   RDW 13.0 11.5 - 15.5 %   Platelets 92 (L) 150 - 400 K/uL    Comment: Immature Platelet Fraction may be clinically indicated, consider ordering this additional test [id number]    nRBC 0.0 0.0 - 0.2 %    Comment: Performed at [facility], [address] [place], [place] [address]  Basic metabolic panel     Status: Abnormal   Collection Time: 11/22/20  4:17 AM  Result Value Ref Range   Sodium 138 135 - 145 mmol/L   Potassium 4.1 3.5 - 5.1 mmol/L   Chloride 107 98 - 111 mmol/L   CO2 22 22 - 32 mmol/L   Glucose, Bld 164 (H) 70 - 99 mg/dL    Comment: Glucose reference range applies only to samples taken after fasting for at least 8 hours.   BUN 44 (H) 8 - 23 mg/dL   Creatinine, Ser 1.21 0.61 - 1.24 mg/dL   Calcium 8.8 (L) 8.9 - 10.3 mg/dL   GFR, Estimated >60 >60 mL/min    Comment: (NOTE) Calculated  using the CKD-EPI Creatinine Equation (2021)    Anion gap 9 5 - 15    Comment: Performed at [facility], [address] [place], [place] [address]  Hemoglobin A1c     Status: Abnormal   Collection Time: 11/22/20  4:17 AM  Result Value Ref Range   Hgb A1c MFr Bld 7.5 (H) 4.8 - 5.6 %    Comment: (NOTE) Pre diabetes:          5.7%-6.4%  Diabetes:              >6.4%  Glycemic control for   <7.0% adults with diabetes    Mean Plasma Glucose 168.55 mg/dL    Comment: Performed at [facility] [address], [place], [place] [address]  CBG monitoring, ED     Status: Abnormal   Collection Time: 11/22/20  8:33 AM  Result Value Ref Range   Glucose-Capillary 208 (H) 70 - 99 mg/dL    Comment: Glucose reference range applies only to samples taken after fasting for at least 8 hours.  CBG monitoring, ED     Status: Abnormal   Collection Time: 11/22/20 12:29 PM  Result Value Ref Range   Glucose-Capillary 223 (H) 70 - 99 mg/dL    Comment: Glucose reference range applies only to samples taken after fasting for at least 8 hours.    Assessment/Plan:  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 He had an uninfused CT scan of the chest which I have independently reviewed on December 3.  This demonstrates a 4.0 cm a sending thoracic aorta.  This is just beyond what is a valve replacement that is visible on the CT.  He is also had a pacemaker.  This is an uninfused scan so no note can be made of the flow in the area.   This is not surprising given his AVR done in the area previously.  This is not something that needs follow by [place] at this time.  If it enlarges to >6 cm, he  should contact his cardiothoracic surgeon.   Hypertension blood pressure control important in reducing the progression of atherosclerotic disease. On appropriate oral medications.   Diabetes mellitus without complication (HCC) blood glucose control important in reducing the progression of  atherosclerotic disease. Also, involved in wound healing. On appropriate medications.  Hyperlipidemia associated with type 2 diabetes mellitus (HCC) lipid control important in reducing the progression of atherosclerotic disease. Continue statin therapy       Nathan Singleton 11/26/2020, 10:28 AM   This note was created with Dragon medical transcription system.  Any errors from dictation are unintentional.

## 2020-12-10 ENCOUNTER — Other Ambulatory Visit: Payer: Self-pay | Admitting: Cardiology

## 2020-12-10 DIAGNOSIS — I739 Peripheral vascular disease, unspecified: Secondary | ICD-10-CM

## 2020-12-19 ENCOUNTER — Other Ambulatory Visit: Payer: Self-pay

## 2020-12-19 ENCOUNTER — Ambulatory Visit (INDEPENDENT_AMBULATORY_CARE_PROVIDER_SITE_OTHER): Payer: Medicare Other

## 2020-12-19 DIAGNOSIS — I739 Peripheral vascular disease, unspecified: Secondary | ICD-10-CM | POA: Diagnosis not present

## 2020-12-20 ENCOUNTER — Telehealth: Payer: Self-pay | Admitting: Emergency Medicine

## 2020-12-20 NOTE — Telephone Encounter (Signed)
Patient fiance per patient verbal permission informed of results of abi and arterial lower extremity ultrasound. She reports he is still having really bad cramps. Will informed Dr. Agustin Cree.

## 2020-12-23 ENCOUNTER — Ambulatory Visit: Payer: Medicare Other | Admitting: Cardiology

## 2020-12-23 NOTE — Telephone Encounter (Signed)
Let check magnesium and potassium please meaning Chem-7 and magnesium

## 2020-12-23 NOTE — Telephone Encounter (Signed)
Left message for patient to return call.

## 2020-12-25 NOTE — Telephone Encounter (Signed)
Left message for patient to return call.

## 2020-12-27 NOTE — Telephone Encounter (Signed)
Senaida Ores, RN  [facility] Triage 20 hours ago (12:46 PM)   Patient needs labs per Dr. Agustin Cree. Patient reports he has switched to [place] office because it is closer for him. Can someone address this call and get labs if the provider they see there wants them ?    Message text

## 2020-12-31 ENCOUNTER — Ambulatory Visit: Payer: Medicare Other | Admitting: Gastroenterology

## 2020-12-31 ENCOUNTER — Encounter: Payer: Self-pay | Admitting: Emergency Medicine

## 2020-12-31 ENCOUNTER — Emergency Department
Admission: EM | Admit: 2020-12-31 | Discharge: 2020-12-31 | Disposition: A | Discharge status: Home / Self Care | Payer: Medicare Other | Attending: Emergency Medicine | Admitting: Emergency Medicine

## 2020-12-31 ENCOUNTER — Emergency Department: Payer: Medicare Other

## 2020-12-31 ENCOUNTER — Other Ambulatory Visit: Payer: Self-pay

## 2020-12-31 DIAGNOSIS — E1122 Type 2 diabetes mellitus with diabetic chronic kidney disease: Secondary | ICD-10-CM | POA: Diagnosis not present

## 2020-12-31 DIAGNOSIS — W010XXA Fall on same level from slipping, tripping and stumbling without subsequent striking against object, initial encounter: Secondary | ICD-10-CM | POA: Insufficient documentation

## 2020-12-31 DIAGNOSIS — Z79899 Other long term (current) drug therapy: Secondary | ICD-10-CM | POA: Insufficient documentation

## 2020-12-31 DIAGNOSIS — G319 Degenerative disease of nervous system, unspecified: Secondary | ICD-10-CM | POA: Insufficient documentation

## 2020-12-31 DIAGNOSIS — R197 Diarrhea, unspecified: Secondary | ICD-10-CM

## 2020-12-31 DIAGNOSIS — E1165 Type 2 diabetes mellitus with hyperglycemia: Secondary | ICD-10-CM | POA: Diagnosis not present

## 2020-12-31 DIAGNOSIS — S40012A Contusion of left shoulder, initial encounter: Secondary | ICD-10-CM | POA: Insufficient documentation

## 2020-12-31 DIAGNOSIS — N189 Chronic kidney disease, unspecified: Secondary | ICD-10-CM | POA: Diagnosis not present

## 2020-12-31 DIAGNOSIS — S0990XA Unspecified injury of head, initial encounter: Secondary | ICD-10-CM | POA: Diagnosis not present

## 2020-12-31 DIAGNOSIS — I2510 Atherosclerotic heart disease of native coronary artery without angina pectoris: Secondary | ICD-10-CM | POA: Diagnosis not present

## 2020-12-31 DIAGNOSIS — J449 Chronic obstructive pulmonary disease, unspecified: Secondary | ICD-10-CM | POA: Diagnosis not present

## 2020-12-31 DIAGNOSIS — I1310 Hypertensive heart and chronic kidney disease without heart failure, with stage 1 through stage 4 chronic kidney disease, or unspecified chronic kidney disease: Secondary | ICD-10-CM | POA: Insufficient documentation

## 2020-12-31 DIAGNOSIS — S46812A Strain of other muscles, fascia and tendons at shoulder and upper arm level, left arm, initial encounter: Secondary | ICD-10-CM | POA: Insufficient documentation

## 2020-12-31 DIAGNOSIS — Z20822 Contact with and (suspected) exposure to covid-19: Secondary | ICD-10-CM | POA: Insufficient documentation

## 2020-12-31 DIAGNOSIS — R739 Hyperglycemia, unspecified: Secondary | ICD-10-CM

## 2020-12-31 DIAGNOSIS — Z794 Long term (current) use of insulin: Secondary | ICD-10-CM | POA: Diagnosis not present

## 2020-12-31 DIAGNOSIS — S4992XA Unspecified injury of left shoulder and upper arm, initial encounter: Secondary | ICD-10-CM | POA: Diagnosis present

## 2020-12-31 DIAGNOSIS — Y92009 Unspecified place in unspecified non-institutional (private) residence as the place of occurrence of the external cause: Secondary | ICD-10-CM | POA: Diagnosis not present

## 2020-12-31 DIAGNOSIS — Z87891 Personal history of nicotine dependence: Secondary | ICD-10-CM | POA: Insufficient documentation

## 2020-12-31 DIAGNOSIS — Z96653 Presence of artificial knee joint, bilateral: Secondary | ICD-10-CM | POA: Insufficient documentation

## 2020-12-31 DIAGNOSIS — Y9301 Activity, walking, marching and hiking: Secondary | ICD-10-CM | POA: Insufficient documentation

## 2020-12-31 DIAGNOSIS — Z950 Presence of cardiac pacemaker: Secondary | ICD-10-CM | POA: Diagnosis not present

## 2020-12-31 DIAGNOSIS — W19XXXA Unspecified fall, initial encounter: Secondary | ICD-10-CM

## 2020-12-31 LAB — CBC
HCT: 32.2 % — ABNORMAL LOW (ref 39.0–52.0)
Hemoglobin: 10.9 g/dL — ABNORMAL LOW (ref 13.0–17.0)
MCH: 32.2 pg (ref 26.0–34.0)
MCHC: 33.9 g/dL (ref 30.0–36.0)
MCV: 95 fL (ref 80.0–100.0)
Platelets: 85 10*3/uL — ABNORMAL LOW (ref 150–400)
RBC: 3.39 MIL/uL — ABNORMAL LOW (ref 4.22–5.81)
RDW: 12.7 % (ref 11.5–15.5)
WBC: 3.6 10*3/uL — ABNORMAL LOW (ref 4.0–10.5)
nRBC: 0 % (ref 0.0–0.2)

## 2020-12-31 LAB — COMPREHENSIVE METABOLIC PANEL
ALT: 59 U/L — ABNORMAL HIGH (ref 0–44)
AST: 64 U/L — ABNORMAL HIGH (ref 15–41)
Albumin: 3.9 g/dL (ref 3.5–5.0)
Alkaline Phosphatase: 84 U/L (ref 38–126)
Anion gap: 10 (ref 5–15)
BUN: 42 mg/dL — ABNORMAL HIGH (ref 8–23)
CO2: 23 mmol/L (ref 22–32)
Calcium: 8.7 mg/dL — ABNORMAL LOW (ref 8.9–10.3)
Chloride: 107 mmol/L (ref 98–111)
Creatinine, Ser: 1.33 mg/dL — ABNORMAL HIGH (ref 0.61–1.24)
GFR, Estimated: 55 mL/min — ABNORMAL LOW (ref >60)
Glucose, Bld: 335 mg/dL — ABNORMAL HIGH (ref 70–99)
Potassium: 4.3 mmol/L (ref 3.5–5.1)
Sodium: 140 mmol/L (ref 135–145)
Total Bilirubin: 0.6 mg/dL (ref 0.3–1.2)
Total Protein: 6.8 g/dL (ref 6.5–8.1)

## 2020-12-31 LAB — URINALYSIS, COMPLETE (UACMP) WITH MICROSCOPIC
Bilirubin Urine: NEGATIVE
Glucose, UA: 150 mg/dL — AB
Hgb urine dipstick: NEGATIVE
Ketones, ur: NEGATIVE mg/dL
Leukocytes,Ua: NEGATIVE
Nitrite: NEGATIVE
Protein, ur: NEGATIVE mg/dL
Specific Gravity, Urine: 1.011 (ref 1.005–1.030)
WBC, UA: NONE SEEN WBC/hpf (ref 0–5)
pH: 5 (ref 5.0–8.0)

## 2020-12-31 LAB — LIPASE, BLOOD: Lipase: 29 U/L (ref 11–51)

## 2020-12-31 LAB — CBG MONITORING, ED
Glucose-Capillary: 292 mg/dL — ABNORMAL HIGH (ref 70–99)
Glucose-Capillary: 120 mg/dL — ABNORMAL HIGH (ref 70–99)

## 2020-12-31 MED ORDER — LIDOCAINE 5 % EX PTCH
1.0000 | MEDICATED_PATCH | CUTANEOUS | Status: DC
  Administered 2020-12-31: 1 via TRANSDERMAL
  Filled 2020-12-31: qty 1

## 2020-12-31 MED ORDER — OXYCODONE HCL 5 MG PO TABS
5.0000 mg | ORAL_TABLET | Freq: Once | ORAL | Status: AC
  Administered 2020-12-31: 5 mg via ORAL
  Filled 2020-12-31: qty 1

## 2020-12-31 MED ORDER — INSULIN ASPART 100 UNIT/ML ~~LOC~~ SOLN: 0.0000 [IU] | SUBCUTANEOUS | Status: DC

## 2020-12-31 MED ORDER — ACETAMINOPHEN 500 MG PO TABS
1000.0000 mg | ORAL_TABLET | Freq: Once | ORAL | Status: AC
  Administered 2020-12-31: 1000 mg via ORAL
  Filled 2020-12-31: qty 2

## 2020-12-31 NOTE — ED Triage Notes (Signed)
Patient presents to the ED via EMS post fall and pain to his right shoulder.  Patient states he was walking on the ice to get to his car, to go to the doctor because he has had diarrhea x 3 weeks.  Patient also states that per EMS his blood sugar was >500 which is abnormal for him.

## 2020-12-31 NOTE — ED Provider Notes (Signed)
[facility] Emergency Department Provider Note  ____________________________________________   Event Date/Time   First MD Initiated Contact with Patient 12/31/20 1638     (approximate)  I have reviewed the triage vital signs and the nursing notes.   HISTORY  Chief Complaint Fall and Shoulder Injury   HPI Nathan Singleton is a 80 y.o. male with a past medical history of arthritis, CAD, aortic aneurysm, DM, HTN, HDL, PVD and aortic stenosis status post TAVR not systemically anticoagulated who presents for assessment of some pain in his left shoulder after he had a ground-level fall earlier today.  Patient states he was out walking on his way to his PCP to be assessed for 3 weeks of diarrhea he has had when he slipped on some ice falling onto his left shoulder.  He does not think he hit his head or had LOC.  He denies any other acute pain including left elbow, wrist, right upper extremity or in his back, chest, abdomen, or lower extremities.  No other recent falls or injuries.  Other than diarrhea patient denies any other sick symptoms including fevers, chills, cough, nausea, vomiting, abdominal pain, rash or any urinary symptoms.          Past Medical History:  Diagnosis Date  . Aneurysm (arteriovenous) of coronary vessels   . Arthritis   . Ascending aortic aneurysm ([place])   . CAD (coronary artery disease)   . Diabetes mellitus without complication ([place])   . History of GI bleed   . Hyperlipidemia associated with type 2 diabetes mellitus ([place])   . Hypertension associated with diabetes ([place])   . Presence of permanent cardiac pacemaker   . PVD (peripheral vascular disease) ([place])   . S/P TAVR (transcatheter aortic valve replacement)     Patient Active Problem List   Diagnosis Date Noted  . Anemia 11/26/2020  . Chronic kidney disease (CKD) 11/26/2020  . Claudication ([place]) 11/26/2020  . Contusion of right thigh 11/26/2020  . COPD (chronic obstructive  pulmonary disease) ([place]) 11/26/2020  . Depression 11/26/2020  . Dyspnea 11/26/2020  . Erectile dysfunction 11/26/2020  . GERD without esophagitis 11/26/2020  . Gout 11/26/2020  . Hyperlipidemia, acquired 11/26/2020  . Hyperlipidemia 11/26/2020  . Myocardial infarction ([place]) 11/26/2020  . Personal history of other diseases of urinary system 11/26/2020  . Right leg swelling 11/26/2020  . Sleep apnea 11/26/2020  . Varicose veins 11/26/2020  . Venous insufficiency 11/26/2020  . Vitamin D deficiency 11/26/2020  . Chest pain 11/21/2020  . Thrombocytopenia ([place]) 11/21/2020  . S/P TAVR (transcatheter aortic valve replacement)   . PVD (peripheral vascular disease) ([place])   . Presence of permanent cardiac pacemaker   . Hypertension associated with diabetes ([place])   . Hyperlipidemia associated with type 2 diabetes mellitus ([place])   . History of GI bleed   . CAD (coronary artery disease)   . 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 Pacemaker St Jude device 11/19/2020  . Peripheral vascular disease, unspecified ([place]) stents to popliteal arteries many years ago 11/19/2020  . History of gingival bleeding 11/19/2020  . Coronary artery disease involving coronary bypass graft of native heart with angina pectoris ([place]) 11/19/2020  . 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 Hypertension   . Diabetes mellitus without complication ([place])   . Arthritis   . Closed nondisplaced fracture of greater trochanter of right femur ([place]) 01/25/2020  . DDD (degenerative  disc disease), lumbar 12/10/2019  . Chronic low back pain 12/09/2019  . 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 Urinary retention 12/09/2019  . Hearing loss 05/11/2019  . Obesity 04/07/2019  . 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 Bipolar disorder ([place]) 01/01/2019  . Nonrheumatic aortic valve stenosis  09/07/2018  . Heart block 04/25/2018  . Calculus of gallbladder with acute cholecystitis without obstruction 09/21/2017  . Ulcer of jejunum 03/23/2014  . Insomnia 03/22/2014  . Blisters of multiple sites 01/18/2014  . Cellulitis 01/18/2014  . OA (osteoarthritis) of knee 06/09/2013  . Status post percutaneous transluminal coronary angioplasty 01/14/2011    Past Surgical History:  Procedure Laterality Date  . Ankle repaired Right   . Carpal Tunnel repaired Left   . CATARACT EXTRACTION Bilateral   . COLONOSCOPY    . Copillar implant     . DG CHOLECYSTOGRAPHY GALL BLADDER ([place] HX)    . GASTRIC BYPASS    . heart valve replaced    . PACEMAKER GENERATOR CHANGE    . REPLACEMENT TOTAL KNEE BILATERAL    . RTC    . STENT PLACE LEFT URETER ([facility] HX) Right    Right Leg  . Toe nail removed Bilateral     Prior to Admission medications   Medication Sig Start Date End Date Taking? Authorizing Provider  allopurinol (ZYLOPRIM) 100 MG tablet Take 100 mg by mouth daily. 10/19/20   [provider]  amLODipine (NORVASC) 10 MG tablet Take 10 mg by mouth daily.    [provider]  BAYER ASPIRIN EC LOW DOSE 81 MG EC tablet Take 81 mg by mouth daily. 11/11/20   [provider]  calcium citrate-vitamin D (CITRACAL+D) 315-200 MG-UNIT tablet Take 1 tablet by mouth daily. 11/11/20   [provider]  carvedilol (COREG) 6.25 MG tablet Take 6.25 mg by mouth 2 (two) times daily. 11/11/20   [provider]  cetirizine (ZYRTEC) 10 MG tablet Take 10 mg by mouth daily. 11/11/20   [provider]  D-5000 125 MCG (5000 UT) TABS Take 1 tablet by mouth daily. 11/11/20   [provider]  docusate sodium (COLACE) 100 MG capsule Take 200 mg by mouth 2 (two) times daily. 11/11/20   [provider]  furosemide (LASIX) 20 MG tablet Take 20 mg by mouth daily. 10/19/20   [provider]  HYDROcodone-acetaminophen (NORCO) 7.5-325 MG tablet Take 1-2  tablets by mouth every 6 (six) hours as needed for moderate pain or severe pain. 11/22/20   Swayze, Ava, DO  insulin glargine (LANTUS) 100 UNIT/ML Solostar Pen Inject 25 Units into the skin at bedtime.    [provider]  LINZESS 145 MCG CAPS capsule Take 145 mcg by mouth daily. 11/11/20   [provider]  liraglutide (VICTOZA) 18 MG/3ML SOPN Inject 1.8 mg into the skin daily.    [provider]  multivitamin (ONE-A-DAY MEN'S) TABS tablet Take 1 tablet by mouth daily. 11/11/20   [provider]  pantoprazole (PROTONIX) 20 MG tablet Take 20 mg by mouth 2 (two) times daily. 11/11/20   [provider]  rosuvastatin (CRESTOR) 20 MG tablet Take 2 tablets (40 mg total) by mouth daily. 11/22/20 11/22/21  Swayze, Ava, DO  sertraline (ZOLOFT) 100 MG tablet Take 200 mg by mouth daily. 11/11/20   [provider]  tamsulosin (FLOMAX) 0.4 MG CAPS capsule Take 0.4 mg by mouth daily. 11/11/20   [provider]  Allergies Atorvastatin, Celecoxib, Dicyclomine hcl, Doxycycline, and Metformin  Family History  Problem Relation Age of Onset  . Heart disease Mother   . Alzheimer's disease Mother   . Heart disease Father   . Diabetes Father     Social History Social History   Tobacco Use  . Smoking status: Former Research scientist (life sciences)  . Smokeless tobacco: Never Used  Substance Use Topics  . Alcohol use: Not Currently  . Drug use: Not Currently    Review of Systems  Review of Systems  Constitutional: Negative for chills and fever.  HENT: Negative for sore throat.   Eyes: Negative for pain.  Respiratory: Negative for cough and stridor.   Cardiovascular: Negative for chest pain.  Gastrointestinal: Positive for diarrhea. Negative for vomiting.  Genitourinary: Negative for dysuria.  Musculoskeletal: Positive for joint pain ( L shoulder), myalgias ( L shoulder) and neck pain ( L neck).  Skin: Negative for rash.  Neurological: Negative for seizures,  loss of consciousness and headaches.  Psychiatric/Behavioral: Negative for suicidal ideas.  All other systems reviewed and are negative.     ____________________________________________   PHYSICAL EXAM:  VITAL SIGNS: ED Triage Vitals  Enc Vitals Group     BP 12/31/20 1354 (!) 116/59     Pulse Rate 12/31/20 1354 68     Resp 12/31/20 1354 16     Temp 12/31/20 1354 98.9 F (37.2 C)     Temp Source 12/31/20 1354 Oral     SpO2 12/31/20 1354 95 %     Weight 12/31/20 1355 240 lb (108.9 kg)     Height 12/31/20 1355 6' (1.829 m)     Head Circumference --      Peak Flow --      Pain Score 12/31/20 1355 10     Pain Loc --      Pain Edu? --      Excl. in [place]? --    Vitals:   12/31/20 1354  BP: (!) 116/59  Pulse: 68  Resp: 16  Temp: 98.9 F (37.2 C)  SpO2: 95%   Physical Exam Vitals and nursing note reviewed.  Constitutional:      Appearance: He is well-developed and well-nourished.  HENT:     Head: Normocephalic and atraumatic.     Right Ear: External ear normal.     Left Ear: External ear normal.     Nose: Nose normal.  Eyes:     Conjunctiva/sclera: Conjunctivae normal.  Cardiovascular:     Rate and Rhythm: Normal rate and regular rhythm.     Heart sounds: No murmur heard.   Pulmonary:     Effort: Pulmonary effort is normal. No respiratory distress.     Breath sounds: Normal breath sounds.  Abdominal:     Palpations: Abdomen is soft.     Tenderness: There is no abdominal tenderness.  Musculoskeletal:        General: No edema.     Cervical back: Neck supple.  Skin:    General: Skin is warm and dry.     Capillary Refill: Capillary refill takes less than 2 seconds.  Neurological:     Mental Status: He is alert and oriented to person, place, and time.  Psychiatric:        Mood and Affect: Mood and affect and mood normal.     Cranial nerves II through XII grossly intact.  With exception of abduction and any movement of the left shoulder patient has full  strength and range of motion  full sensation throughout his right upper extremity and distally in the left upper extremity as well as the bilateral extremities.  2+ bilateral radial pulses.  No tenderness step-offs or deformities over the C/T/L-spine.  Patient has significant tenderness over the left trapezius muscle.  There is no effusion or deformity of the left shoulder.  Some tenderness over the anterior lateral and posterior aspect of the shoulder.  Patient does have just significant decreased range of motion.  ____________________________________________   LABS (all labs ordered are listed, but only abnormal results are displayed)  Labs Reviewed  COMPREHENSIVE METABOLIC PANEL - Abnormal; Notable for the following components:      Result Value   Glucose, Bld 335 (*)    BUN 42 (*)    Creatinine, Ser 1.33 (*)    Calcium 8.7 (*)    AST 64 (*)    ALT 59 (*)    GFR, Estimated 55 (*)    All other components within normal limits  CBC - Abnormal; Notable for the following components:   WBC 3.6 (*)    RBC 3.39 (*)    Hemoglobin 10.9 (*)    HCT 32.2 (*)    Platelets 85 (*)    All other components within normal limits  URINALYSIS, COMPLETE (UACMP) WITH MICROSCOPIC - Abnormal; Notable for the following components:   Color, Urine YELLOW (*)    APPearance CLEAR (*)    Glucose, UA 150 (*)    Bacteria, UA RARE (*)    All other components within normal limits  CBG MONITORING, ED - Abnormal; Notable for the following components:   Glucose-Capillary 292 (*)    All other components within normal limits  CBG MONITORING, ED - Abnormal; Notable for the following components:   Glucose-Capillary 120 (*)    All other components within normal limits  SARS CORONAVIRUS 2 (TAT 6-24 HRS)  LIPASE, BLOOD   ____________________________________________   ____________________________________________  RADIOLOGY  ED MD interpretation: CT head and C-spine showed no evidence of intracranial hemorrhage  skull fracture or C-spine injury.  Plain films of the patient's chest, left shoulder and left humerus showed no evidence of fracture dislocation rib fracture or other clear acute process.  Official radiology report(s): DG Chest 2 View  Result Date: 12/31/2020 CLINICAL DATA:  Recent fall with left-sided shoulder pain, initial encounter EXAM: CHEST - 2 VIEW COMPARISON:  11/21/2020 FINDINGS: Cardiac shadow is within normal limits. Pacing device is again seen. Lungs are well aerated bilaterally. No focal infiltrate or sizable effusion is seen. Changes of prior TAVR are noted. Old rib fractures are seen on the right. IMPRESSION: No acute abnormality noted. Electronically Signed   By: Inez Catalina M.D.   On: 12/31/2020 17:58   CT Head Wo Contrast  Result Date: 12/31/2020 CLINICAL DATA:  Status post fall. EXAM: CT HEAD WITHOUT CONTRAST TECHNIQUE: Contiguous axial images were obtained from the base of the skull through the vertex without intravenous contrast. COMPARISON:  September 06, 2020 FINDINGS: Brain: There is mild cerebral atrophy with widening of the extra-axial spaces and ventricular dilatation. There are areas of decreased attenuation within the white matter tracts of the supratentorial brain, consistent with microvascular disease changes. Vascular: No hyperdense vessel or unexpected calcification. Skull: Negative for an acute fracture. A cochlear implant is seen over the left. Associated streak artifact is noted with subsequently limited evaluation of the adjacent osseous and soft tissue structures. Sinuses/Orbits: No acute finding. Postoperative changes are seen involving the left mastoid air cells. Other: None. IMPRESSION:  1. Generalized cerebral atrophy. 2. No acute intracranial abnormality. 3. Postoperative changes involving the left mastoid air cells. Electronically Signed   By: Virgina Norfolk M.D.   On: 12/31/2020 18:21   CT Cervical Spine Wo Contrast  Result Date: 12/31/2020 CLINICAL DATA:   Status post fall. EXAM: CT CERVICAL SPINE WITHOUT CONTRAST TECHNIQUE: Multidetector CT imaging of the cervical spine was performed without intravenous contrast. Multiplanar CT image reconstructions were also generated. COMPARISON:  None. FINDINGS: Alignment: Normal. Skull base and vertebrae: No acute fracture. Chronic changes are seen involving the tip of the dens. No primary bone lesion or focal pathologic process. Soft tissues and spinal canal: No prevertebral fluid or swelling. No visible canal hematoma. Disc levels: Moderate to marked severity endplate sclerosis and anterior osteophyte formation is seen at the levels of C2-C3, C3-C4, C4-C5, C5-C6, C6-C7 and C7-T1. Marked severity intervertebral disc space narrowing is seen at the level of C4-C5. Moderate severity intervertebral disc space narrowing is seen throughout the remainder of the cervical spine. Moderate severity bilateral multilevel facet joint hypertrophy is noted. Upper chest: Negative. Other: None. IMPRESSION: 1. Moderate to marked severity degenerative changes of the cervical spine, as described above. 2. No evidence of an acute fracture or subluxation. Electronically Signed   By: Virgina Norfolk M.D.   On: 12/31/2020 18:25   DG Shoulder Left  Result Date: 12/31/2020 CLINICAL DATA:  Left shoulder pain after fall. EXAM: LEFT SHOULDER - 2+ VIEW COMPARISON:  September 06, 2020. FINDINGS: There is no evidence of fracture or dislocation. Mild degenerative changes seen involving the left acromioclavicular joint. Calcification is seen over the humeral head suggesting calcific tendinosis. Soft tissues are unremarkable. IMPRESSION: No acute abnormality seen in the left shoulder. Probable calcific tendinosis of rotator cuff. Electronically Signed   By: Marijo Conception M.D.   On: 12/31/2020 14:56   DG Humerus Left  Result Date: 12/31/2020 CLINICAL DATA:  Recent fall with left-sided arm pain, initial encounter EXAM: LEFT HUMERUS - 2+ VIEW COMPARISON:   None. FINDINGS: There is no evidence of fracture or other focal bone lesions. Soft tissues are unremarkable. IMPRESSION: No acute abnormality noted. Electronically Signed   By: Inez Catalina M.D.   On: 12/31/2020 17:59    ____________________________________________   PROCEDURES  Procedure(s) performed (including Critical Care):  .1-3 Lead EKG Interpretation Performed by: Lucrezia Starch, MD Authorized by: Lucrezia Starch, MD     Interpretation: normal     ECG rate assessment: normal     Rhythm: sinus rhythm     Ectopy: none     Conduction: normal       ____________________________________________   INITIAL IMPRESSION / ASSESSMENT AND PLAN / ED COURSE      Patient presents for assessment of left shoulder pain after mechanical ground-level fall.  On arrival he is afebrile and hemodynamically stable.  Exception of pain and weakness in his left shoulder he otherwise is neurovascularly intact in all extremities.  Impression is likely musculoskeletal strain of trapezius with possible additional MSK injuries in the shoulder joint.  No evidence on imaging of fracture or dislocation.  No evidence of vascular injury on exam.  CT head and C-spine showed no evidence of fracture or cervical dislocation.  No evidence of intracranial hemorrhage.  Left humerus, left shoulder and chest x-ray plain film showed no evidence of fracture or dislocation.  Pression is contusion of the left shoulder and possible strain of the left trapezius.  With regard to patient's diarrhea for the last  3 weeks unclear etiology this time although infectious is related within the differential.  Patient unable to provide stool sample in the ED after approximately 6 hours.  Lipase is not consistent with acute pancreatitis.  CMP shows no significant electrolyte or metabolic derangements aside from mild hyperglycemia without any evidence of acidosis.  Creatinine of 1.33 is at baseline as patient's creatinine is noted to  range from 1.2-1.6 over the last 2 months.  CBC is unremarkable as hemoglobin is at baseline at 10.9 compared to 11.36-monthago.  Leukocytosis also baseline BBC count of 3.6 compared to 3.320-monthgo.  Lites are also at baseline at 85 compared to 92 1 month ago.  UA does not appear infected has no blood suggestive of stone or cystitis or pyelonephritis.  [id number] certainly a possibility versus other infectious etiologies.  Low suspicion for ischemic colitis at this time.  Given stable vitals with reassuring exam and work-up and patient will provide stool sample in the emergency room at this time believe he is safe for discharge with plan for further outpatient evaluation of his diarrhea.  Discharge stable condition.  Strict turn precautions advised and discussed.        ____________________________________________   FINAL CLINICAL IMPRESSION(S) / ED DIAGNOSES  Final diagnoses:  Diarrhea, unspecified type  Contusion of left shoulder, initial encounter  Strain of left trapezius muscle, initial encounter  Hyperglycemia  Fall, initial encounter  Person under investigation for COVID-19    Medications  insulin aspart (novoLOG) injection 0-15 Units (0 Units Subcutaneous Not Given 12/31/20 1722)  lidocaine (LIDODERM) 5 % 1 patch (1 patch Transdermal Patch Applied 12/31/20 1729)  acetaminophen (TYLENOL) tablet 1,000 mg (1,000 mg Oral Given 12/31/20 1720)  oxyCODONE (Oxy IR/ROXICODONE) immediate release tablet 5 mg (5 mg Oral Given 12/31/20 1720)     ED Discharge Orders    None       Note:  This document was prepared using Dragon voice recognition software and may include unintentional dictation errors.   SmLucrezia StarchMD 12/31/20 [id number]

## 2020-12-31 NOTE — ED Triage Notes (Signed)
Presents via EMS s/p fall  States he slipped this am at home  Landed on left shoulder

## 2021-01-01 LAB — SARS CORONAVIRUS 2 (TAT 6-24 HRS): SARS Coronavirus 2: NEGATIVE

## 2021-01-13 ENCOUNTER — Emergency Department
Admission: EM | Admit: 2021-01-13 | Discharge: 2021-01-13 | Disposition: A | Discharge status: Home / Self Care | Payer: Medicare Other | Attending: Emergency Medicine | Admitting: Emergency Medicine

## 2021-01-13 ENCOUNTER — Encounter: Payer: Self-pay | Admitting: Emergency Medicine

## 2021-01-13 ENCOUNTER — Emergency Department: Payer: Medicare Other

## 2021-01-13 DIAGNOSIS — Z5321 Procedure and treatment not carried out due to patient leaving prior to being seen by health care provider: Secondary | ICD-10-CM | POA: Diagnosis not present

## 2021-01-13 DIAGNOSIS — R002 Palpitations: Secondary | ICD-10-CM | POA: Diagnosis present

## 2021-01-13 LAB — TROPONIN I (HIGH SENSITIVITY): Troponin I (High Sensitivity): 13 ng/L (ref <18)

## 2021-01-13 LAB — CBC
HCT: 29.2 % — ABNORMAL LOW (ref 39.0–52.0)
Hemoglobin: 10.1 g/dL — ABNORMAL LOW (ref 13.0–17.0)
MCH: 32.5 pg (ref 26.0–34.0)
MCHC: 34.6 g/dL (ref 30.0–36.0)
MCV: 93.9 fL (ref 80.0–100.0)
Platelets: 119 10*3/uL — ABNORMAL LOW (ref 150–400)
RBC: 3.11 MIL/uL — ABNORMAL LOW (ref 4.22–5.81)
RDW: 13 % (ref 11.5–15.5)
WBC: 3.6 10*3/uL — ABNORMAL LOW (ref 4.0–10.5)
nRBC: 0 % (ref 0.0–0.2)

## 2021-01-13 LAB — BASIC METABOLIC PANEL
Anion gap: 12 (ref 5–15)
BUN: 54 mg/dL — ABNORMAL HIGH (ref 8–23)
CO2: 16 mmol/L — ABNORMAL LOW (ref 22–32)
Calcium: 8.7 mg/dL — ABNORMAL LOW (ref 8.9–10.3)
Chloride: 112 mmol/L — ABNORMAL HIGH (ref 98–111)
Creatinine, Ser: 1.44 mg/dL — ABNORMAL HIGH (ref 0.61–1.24)
GFR, Estimated: 50 mL/min — ABNORMAL LOW (ref >60)
Glucose, Bld: 215 mg/dL — ABNORMAL HIGH (ref 70–99)
Potassium: 3.7 mmol/L (ref 3.5–5.1)
Sodium: 140 mmol/L (ref 135–145)

## 2021-01-13 LAB — TSH: TSH: 3.74 u[IU]/mL (ref 0.350–4.500)

## 2021-01-13 LAB — T4, FREE: Free T4: 0.74 ng/dL (ref 0.61–1.12)

## 2021-01-13 NOTE — ED Triage Notes (Signed)
Pt arrived via EMS from home where he called out due to palpitations approx 20 minutes prior to EMS arrival. Pt with hx/o Afib. EMS reports pt controlled Afib 80-90bpm on monitor. Pt in ED reporting that he feels better and is not experiencing the palpitations currently but wants to get checked out.

## 2021-01-13 NOTE — ED Notes (Signed)
Patient refused repeat troponin. Patient states that he is going home.

## 2021-01-14 ENCOUNTER — Ambulatory Visit: Payer: Medicare Other | Attending: Neurology

## 2021-01-15 ENCOUNTER — Other Ambulatory Visit: Payer: Self-pay | Admitting: Nephrology

## 2021-01-15 DIAGNOSIS — N1832 Chronic kidney disease, stage 3b: Secondary | ICD-10-CM

## 2021-01-15 DIAGNOSIS — E1122 Type 2 diabetes mellitus with diabetic chronic kidney disease: Secondary | ICD-10-CM

## 2021-01-15 DIAGNOSIS — R8290 Unspecified abnormal findings in urine: Secondary | ICD-10-CM

## 2021-01-17 ENCOUNTER — Ambulatory Visit: Payer: Medicare Other | Admitting: Cardiology

## 2021-01-22 ENCOUNTER — Other Ambulatory Visit: Payer: Self-pay | Admitting: Podiatry

## 2021-01-22 ENCOUNTER — Ambulatory Visit
Admission: RE | Admit: 2021-01-22 | Discharge: 2021-01-22 | Disposition: A | Discharge status: Home / Self Care | Payer: Medicare Other | Source: Ambulatory Visit | Attending: Podiatry | Admitting: Podiatry

## 2021-01-22 ENCOUNTER — Other Ambulatory Visit: Payer: Self-pay

## 2021-01-22 DIAGNOSIS — R0989 Other specified symptoms and signs involving the circulatory and respiratory systems: Secondary | ICD-10-CM

## 2021-01-22 DIAGNOSIS — M7989 Other specified soft tissue disorders: Secondary | ICD-10-CM | POA: Insufficient documentation

## 2021-01-22 DIAGNOSIS — E1142 Type 2 diabetes mellitus with diabetic polyneuropathy: Secondary | ICD-10-CM

## 2021-01-22 DIAGNOSIS — M79661 Pain in right lower leg: Secondary | ICD-10-CM

## 2021-01-24 ENCOUNTER — Ambulatory Visit: Payer: Medicare Other

## 2021-02-03 ENCOUNTER — Ambulatory Visit: Payer: Medicare Other | Attending: Neurology

## 2021-02-07 ENCOUNTER — Ambulatory Visit: Payer: Medicare Other

## 2021-02-11 ENCOUNTER — Other Ambulatory Visit: Payer: Self-pay

## 2021-02-11 ENCOUNTER — Ambulatory Visit: Payer: Medicare Other | Admitting: Gastroenterology

## 2021-02-11 VITALS — BP 90/49 | HR 76 | Temp 98.6°F | Ht 72.0 in | Wt 253.0 lb

## 2021-02-11 DIAGNOSIS — R197 Diarrhea, unspecified: Secondary | ICD-10-CM | POA: Diagnosis not present

## 2021-02-11 DIAGNOSIS — K289 Gastrojejunal ulcer, unspecified as acute or chronic, without hemorrhage or perforation: Secondary | ICD-10-CM

## 2021-02-11 MED ORDER — PANTOPRAZOLE SODIUM 40 MG PO TBEC: 40.0000 mg | DELAYED_RELEASE_TABLET | Freq: Every day | ORAL | 1 refills | Status: AC

## 2021-02-11 NOTE — Progress Notes (Signed)
Nathan Bellows MD, MRCP(U.K) [address]  [place]  [place], [place] [address]  Main: [phone]  Fax: [phone]   Gastroenterology Consultation  Referring Provider:     Ranae Singleton, [place] Primary Care Physician:  Nathan Singleton, [place] Primary Gastroenterologist:  Dr. Jonathon Singleton  Reason for Consultation: History of gastric ulcers        HPI:   Nathan Singleton is a 80 y.o. y/o male who had an endoscopy as well as colonoscopy in 08/02/2020 in [place].  GJ anastomosis showed multiple superficial ulcers 1 also was oozing.  Hemoclip was placed.  Jejunum appeared normal.  Colonoscopy was normal except for sessile polyp taken out by cold snare in the ascending colon.  Recommendations were to repeat an EGD in a couple of months 01/13/2021: Hemoglobin 10.1 g with an MCV of 93.9.  Hemoglobin has dropped since 4 months.  Creatinine 1.44.  he states for many months has been having multiple bouts of diarrhea each day.  Literally everything eats comes out undigested denies any blood in his stool.  Uses some artificial sweeteners in his diet and probiotics.  Denies any use of NSAIDs.  Also complains of left upper quadrant pain.  Which he has been having for many months.  He states he would like to get his repeat endoscopy done.  Gastric bypass previously performed to lose weight.  Denies any use of blood thinners Past Medical History:  Diagnosis Date  . Aneurysm (arteriovenous) of coronary vessels   . Arthritis   . Ascending aortic aneurysm ([place])   . CAD (coronary artery disease)   . Diabetes mellitus without complication ([place])   . History of GI bleed   . Hyperlipidemia associated with type 2 diabetes mellitus ([place])   . Hypertension associated with diabetes ([place])   . Presence of permanent cardiac pacemaker   . PVD (peripheral vascular disease) ([place])   . S/P TAVR (transcatheter aortic valve replacement)     Past Surgical History:  Procedure Laterality Date  . Ankle repaired Right   .  Carpal Tunnel repaired Left   . CATARACT EXTRACTION Bilateral   . COLONOSCOPY    . Copillar implant     . DG CHOLECYSTOGRAPHY GALL BLADDER ([place] HX)    . GASTRIC BYPASS    . heart valve replaced    . PACEMAKER GENERATOR CHANGE    . REPLACEMENT TOTAL KNEE BILATERAL    . RTC    . STENT PLACE LEFT URETER ([facility] HX) Right    Right Leg  . Toe nail removed Bilateral     Prior to Admission medications   Medication Sig Start Date End Date Taking? Authorizing Provider  allopurinol (ZYLOPRIM) 100 MG tablet Take 100 mg by mouth daily. 10/19/20   [provider]  amLODipine (NORVASC) 10 MG tablet Take 10 mg by mouth daily.    [provider]  BAYER ASPIRIN EC LOW DOSE 81 MG EC tablet Take 81 mg by mouth daily. 11/11/20   [provider]  calcium citrate-vitamin D (CITRACAL+D) 315-200 MG-UNIT tablet Take 1 tablet by mouth daily. 11/11/20   [provider]  carvedilol (COREG) 6.25 MG tablet Take 6.25 mg by mouth 2 (two) times daily. 11/11/20   [provider]  cetirizine (ZYRTEC) 10 MG tablet Take 10 mg by mouth daily. 11/11/20   [provider]  D-5000 125 MCG (5000 UT) TABS Take 1 tablet by mouth daily. 11/11/20   [provider]  docusate sodium (COLACE) 100 MG capsule  Take 200 mg by mouth 2 (two) times daily. 11/11/20   [provider]  furosemide (LASIX) 20 MG tablet Take 20 mg by mouth daily. 10/19/20   [provider]  HYDROcodone-acetaminophen (NORCO) 7.5-325 MG tablet Take 1-2 tablets by mouth every 6 (six) hours as needed for moderate pain or severe pain. 11/22/20   Swayze, Ava, DO  insulin glargine (LANTUS) 100 UNIT/ML Solostar Pen Inject 25 Units into the skin at bedtime.    [provider]  LINZESS 145 MCG CAPS capsule Take 145 mcg by mouth daily. 11/11/20   [provider]  liraglutide (VICTOZA) 18 MG/3ML SOPN Inject 1.8 mg into the skin daily.    [provider]  multivitamin  (ONE-A-DAY MEN'S) TABS tablet Take 1 tablet by mouth daily. 11/11/20   [provider]  pantoprazole (PROTONIX) 20 MG tablet Take 20 mg by mouth 2 (two) times daily. 11/11/20   [provider]  rosuvastatin (CRESTOR) 20 MG tablet Take 2 tablets (40 mg total) by mouth daily. 11/22/20 11/22/21  Swayze, Ava, DO  sertraline (ZOLOFT) 100 MG tablet Take 200 mg by mouth daily. 11/11/20   [provider]  tamsulosin (FLOMAX) 0.4 MG CAPS capsule Take 0.4 mg by mouth daily. 11/11/20   [provider]    Family History  Problem Relation Age of Onset  . Heart disease Mother   . Alzheimer's disease Mother   . Heart disease Father   . Diabetes Father      Social History   Tobacco Use  . Smoking status: Former Research scientist (life sciences)  . Smokeless tobacco: Never Used  Substance Use Topics  . Alcohol use: Not Currently  . Drug use: Not Currently    Allergies as of 02/11/2021 - Review Complete 01/13/2021  Allergen Reaction Noted  . Atorvastatin Other (See Comments) 07/11/2009  . Celecoxib Other (See Comments) 07/28/2012  . Dicyclomine hcl Other (See Comments) 09/07/2018  . Doxycycline  11/06/2020  . Metformin  11/06/2020    Review of Systems:    All systems reviewed and negative except where noted in HPI.   Physical Exam:  There were no vitals taken for this visit. No LMP for male patient. Psych:  Alert and cooperative. Normal mood and affect. General:   Alert,  Well-developed, well-nourished, pleasant and cooperative in NAD Head:  Normocephalic and atraumatic. Eyes:  Sclera clear, no icterus.   Conjunctiva pink. Lungs:  Respirations even and unlabored.  Clear throughout to auscultation.   No wheezes, crackles, or rhonchi. No acute distress. Heart:  Regular rate and rhythm; no murmurs, clicks, rubs, or gallops. Abdomen:  Normal bowel sounds.  No bruits.  Soft, non-tender and non-distended without masses, hepatosplenomegaly or hernias noted.  No guarding or rebound  tenderness.    Neurologic:  Alert and oriented x3;  grossly normal neurologically. Psych:  Alert and cooperative. Normal mood and affect.  Imaging Studies: DG Chest 2 View  Result Date: 01/13/2021 CLINICAL DATA:  Chest pain EXAM: CHEST - 2 VIEW COMPARISON:  12/31/2020 FINDINGS: Cardiac shadow is stable. Changes of prior TAVR are seen. Pacing device is again noted. No focal infiltrate or effusion is seen. No acute bony abnormality is noted. IMPRESSION: No acute abnormality noted. Electronically Signed   By: Inez Catalina M.D.   On: 01/13/2021 02:27   US Venous Img Lower Unilateral Right (DVT)  Result Date: 01/22/2021 CLINICAL DATA:  Right leg pain and swelling x5 days, type 2 diabetes. EXAM: Right LOWER EXTREMITY VENOUS DOPPLER ULTRASOUND TECHNIQUE: Gray-scale sonography  with compression, as well as color and duplex ultrasound, were performed to evaluate the deep venous system(s) from the level of the common femoral vein through the popliteal and proximal calf veins. COMPARISON:  None. FINDINGS: VENOUS Normal compressibility of the common femoral, superficial femoral, and popliteal veins, as well as the visualized calf veins. Visualized portions of profunda femoral vein and great saphenous vein unremarkable. No filling defects to suggest DVT on grayscale or color Doppler imaging. Doppler waveforms show normal direction of venous flow, normal respiratory plasticity and response to augmentation. Limited views of the contralateral common femoral vein are unremarkable. OTHER Right calf subcutaneous fluid. Prominent right groin lymph nodes measuring up to 1.3 cm in short axis. Limitations: none IMPRESSION: 1. No evidence of deep venous thrombosis within the right lower extremity. 2. Right calf subcutaneous fluid with prominent right groin lymph nodes measuring up to 1.3 cm in short axis, which may represent edema versus infection with reactive lymph nodes. Recommend clinical correlation. Electronically Signed    By: Dahlia Bailiff MD   On: 01/22/2021 17:35    Assessment and Plan:   Nathan Singleton is a 80 y.o. y/o male has been referred for history of gastric ulcers.  I reviewed his last endoscopy report performed at [facility] in 2021 which shows ulceration at the gastrojejunal anastomosis.  I do not see any biopsies taken.  Plan was to repeat EGD in 3 months.  He does suffer from a normocytic anemia with elevated creatinine.  Complains of diarrhea which could be due to the gastric bypass and superadded effects of probiotics as well as artificial sugars causing an osmotic diarrhea.  Plan 1.  H. pylori breath test 2.  EGD to check for healing of the ulcer 3.  Increase Protonix to 40 mg once a day 4.  Stop usage of probiotics as well as artificial sugars 5.  Check stool studies.  I have discussed alternative options, risks & benefits,  which include, but are not limited to, bleeding, infection, perforation,respiratory complication & drug reaction.  The patient agrees with this plan & written consent will be obtained.     Follow up in 8 to 10 weeks video visit  Dr Nathan Bellows MD,MRCP(U.K)

## 2021-02-13 ENCOUNTER — Other Ambulatory Visit: Payer: Self-pay

## 2021-02-13 ENCOUNTER — Emergency Department: Payer: Medicare Other

## 2021-02-13 ENCOUNTER — Inpatient Hospital Stay
Admission: EM | Admit: 2021-02-13 | Discharge: 2021-02-22 | DRG: 629 | Disposition: A | Discharge status: Home / Self Care | Payer: Medicare Other | Attending: Internal Medicine | Admitting: Internal Medicine

## 2021-02-13 DIAGNOSIS — I712 Thoracic aortic aneurysm, without rupture: Secondary | ICD-10-CM | POA: Diagnosis present

## 2021-02-13 DIAGNOSIS — I10 Essential (primary) hypertension: Secondary | ICD-10-CM | POA: Diagnosis present

## 2021-02-13 DIAGNOSIS — Z96653 Presence of artificial knee joint, bilateral: Secondary | ICD-10-CM | POA: Diagnosis present

## 2021-02-13 DIAGNOSIS — E1142 Type 2 diabetes mellitus with diabetic polyneuropathy: Secondary | ICD-10-CM | POA: Diagnosis not present

## 2021-02-13 DIAGNOSIS — J302 Other seasonal allergic rhinitis: Secondary | ICD-10-CM | POA: Diagnosis present

## 2021-02-13 DIAGNOSIS — D61818 Other pancytopenia: Secondary | ICD-10-CM | POA: Diagnosis present

## 2021-02-13 DIAGNOSIS — L03115 Cellulitis of right lower limb: Secondary | ICD-10-CM | POA: Diagnosis present

## 2021-02-13 DIAGNOSIS — Z881 Allergy status to other antibiotic agents status: Secondary | ICD-10-CM

## 2021-02-13 DIAGNOSIS — Z6834 Body mass index (BMI) 34.0-34.9, adult: Secondary | ICD-10-CM

## 2021-02-13 DIAGNOSIS — D631 Anemia in chronic kidney disease: Secondary | ICD-10-CM | POA: Diagnosis present

## 2021-02-13 DIAGNOSIS — N1831 Chronic kidney disease, stage 3a: Secondary | ICD-10-CM | POA: Diagnosis present

## 2021-02-13 DIAGNOSIS — Z794 Long term (current) use of insulin: Secondary | ICD-10-CM

## 2021-02-13 DIAGNOSIS — E1151 Type 2 diabetes mellitus with diabetic peripheral angiopathy without gangrene: Secondary | ICD-10-CM | POA: Diagnosis present

## 2021-02-13 DIAGNOSIS — E1169 Type 2 diabetes mellitus with other specified complication: Secondary | ICD-10-CM | POA: Diagnosis not present

## 2021-02-13 DIAGNOSIS — E559 Vitamin D deficiency, unspecified: Secondary | ICD-10-CM | POA: Diagnosis present

## 2021-02-13 DIAGNOSIS — Z8249 Family history of ischemic heart disease and other diseases of the circulatory system: Secondary | ICD-10-CM

## 2021-02-13 DIAGNOSIS — L97519 Non-pressure chronic ulcer of other part of right foot with unspecified severity: Secondary | ICD-10-CM | POA: Diagnosis present

## 2021-02-13 DIAGNOSIS — M1990 Unspecified osteoarthritis, unspecified site: Secondary | ICD-10-CM | POA: Diagnosis present

## 2021-02-13 DIAGNOSIS — Z79899 Other long term (current) drug therapy: Secondary | ICD-10-CM

## 2021-02-13 DIAGNOSIS — E1122 Type 2 diabetes mellitus with diabetic chronic kidney disease: Secondary | ICD-10-CM | POA: Diagnosis present

## 2021-02-13 DIAGNOSIS — E119 Type 2 diabetes mellitus without complications: Secondary | ICD-10-CM

## 2021-02-13 DIAGNOSIS — Z9884 Bariatric surgery status: Secondary | ICD-10-CM

## 2021-02-13 DIAGNOSIS — D649 Anemia, unspecified: Secondary | ICD-10-CM | POA: Diagnosis present

## 2021-02-13 DIAGNOSIS — E785 Hyperlipidemia, unspecified: Secondary | ICD-10-CM | POA: Diagnosis present

## 2021-02-13 DIAGNOSIS — E875 Hyperkalemia: Secondary | ICD-10-CM | POA: Diagnosis present

## 2021-02-13 DIAGNOSIS — N179 Acute kidney failure, unspecified: Secondary | ICD-10-CM | POA: Diagnosis present

## 2021-02-13 DIAGNOSIS — I70235 Atherosclerosis of native arteries of right leg with ulceration of other part of foot: Secondary | ICD-10-CM | POA: Diagnosis present

## 2021-02-13 DIAGNOSIS — I252 Old myocardial infarction: Secondary | ICD-10-CM | POA: Diagnosis not present

## 2021-02-13 DIAGNOSIS — Z20822 Contact with and (suspected) exposure to covid-19: Secondary | ICD-10-CM | POA: Diagnosis present

## 2021-02-13 DIAGNOSIS — Z833 Family history of diabetes mellitus: Secondary | ICD-10-CM

## 2021-02-13 DIAGNOSIS — K219 Gastro-esophageal reflux disease without esophagitis: Secondary | ICD-10-CM | POA: Diagnosis present

## 2021-02-13 DIAGNOSIS — Z66 Do not resuscitate: Secondary | ICD-10-CM | POA: Diagnosis present

## 2021-02-13 DIAGNOSIS — I70202 Unspecified atherosclerosis of native arteries of extremities, left leg: Secondary | ICD-10-CM | POA: Diagnosis present

## 2021-02-13 DIAGNOSIS — Z950 Presence of cardiac pacemaker: Secondary | ICD-10-CM

## 2021-02-13 DIAGNOSIS — I872 Venous insufficiency (chronic) (peripheral): Secondary | ICD-10-CM | POA: Diagnosis present

## 2021-02-13 DIAGNOSIS — Z7982 Long term (current) use of aspirin: Secondary | ICD-10-CM

## 2021-02-13 DIAGNOSIS — H9190 Unspecified hearing loss, unspecified ear: Secondary | ICD-10-CM | POA: Diagnosis present

## 2021-02-13 DIAGNOSIS — F319 Bipolar disorder, unspecified: Secondary | ICD-10-CM | POA: Diagnosis present

## 2021-02-13 DIAGNOSIS — I152 Hypertension secondary to endocrine disorders: Secondary | ICD-10-CM | POA: Diagnosis present

## 2021-02-13 DIAGNOSIS — E11628 Type 2 diabetes mellitus with other skin complications: Secondary | ICD-10-CM | POA: Diagnosis present

## 2021-02-13 DIAGNOSIS — E669 Obesity, unspecified: Secondary | ICD-10-CM | POA: Diagnosis present

## 2021-02-13 DIAGNOSIS — Z888 Allergy status to other drugs, medicaments and biological substances status: Secondary | ICD-10-CM

## 2021-02-13 DIAGNOSIS — I495 Sick sinus syndrome: Secondary | ICD-10-CM | POA: Diagnosis present

## 2021-02-13 DIAGNOSIS — E11621 Type 2 diabetes mellitus with foot ulcer: Secondary | ICD-10-CM | POA: Diagnosis present

## 2021-02-13 DIAGNOSIS — J449 Chronic obstructive pulmonary disease, unspecified: Secondary | ICD-10-CM | POA: Diagnosis present

## 2021-02-13 DIAGNOSIS — Z952 Presence of prosthetic heart valve: Secondary | ICD-10-CM

## 2021-02-13 DIAGNOSIS — M109 Gout, unspecified: Secondary | ICD-10-CM | POA: Diagnosis present

## 2021-02-13 DIAGNOSIS — I2510 Atherosclerotic heart disease of native coronary artery without angina pectoris: Secondary | ICD-10-CM | POA: Diagnosis present

## 2021-02-13 DIAGNOSIS — L0390 Cellulitis, unspecified: Secondary | ICD-10-CM | POA: Diagnosis not present

## 2021-02-13 DIAGNOSIS — Z87891 Personal history of nicotine dependence: Secondary | ICD-10-CM

## 2021-02-13 DIAGNOSIS — L97509 Non-pressure chronic ulcer of other part of unspecified foot with unspecified severity: Secondary | ICD-10-CM | POA: Diagnosis present

## 2021-02-13 DIAGNOSIS — N400 Enlarged prostate without lower urinary tract symptoms: Secondary | ICD-10-CM | POA: Diagnosis present

## 2021-02-13 LAB — CBC WITH DIFFERENTIAL/PLATELET
Abs Immature Granulocytes: 0.01 10*3/uL (ref 0.00–0.07)
Basophils Absolute: 0 10*3/uL (ref 0.0–0.1)
Basophils Relative: 0 %
Eosinophils Absolute: 0.1 10*3/uL (ref 0.0–0.5)
Eosinophils Relative: 4 %
HCT: 26.5 % — ABNORMAL LOW (ref 39.0–52.0)
Hemoglobin: 8.7 g/dL — ABNORMAL LOW (ref 13.0–17.0)
Immature Granulocytes: 0 %
Lymphocytes Relative: 21 %
Lymphs Abs: 0.7 10*3/uL (ref 0.7–4.0)
MCH: 31.1 pg (ref 26.0–34.0)
MCHC: 32.8 g/dL (ref 30.0–36.0)
MCV: 94.6 fL (ref 80.0–100.0)
Monocytes Absolute: 0.2 10*3/uL (ref 0.1–1.0)
Monocytes Relative: 7 %
Neutro Abs: 2.2 10*3/uL (ref 1.7–7.7)
Neutrophils Relative %: 68 %
Platelets: 100 10*3/uL — ABNORMAL LOW (ref 150–400)
RBC: 2.8 MIL/uL — ABNORMAL LOW (ref 4.22–5.81)
RDW: 13.3 % (ref 11.5–15.5)
WBC: 3.2 10*3/uL — ABNORMAL LOW (ref 4.0–10.5)
nRBC: 0 % (ref 0.0–0.2)

## 2021-02-13 LAB — URINALYSIS, COMPLETE (UACMP) WITH MICROSCOPIC
Bacteria, UA: NONE SEEN
Bilirubin Urine: NEGATIVE
Glucose, UA: NEGATIVE mg/dL
Hgb urine dipstick: NEGATIVE
Ketones, ur: NEGATIVE mg/dL
Leukocytes,Ua: NEGATIVE
Nitrite: NEGATIVE
Protein, ur: NEGATIVE mg/dL
Specific Gravity, Urine: 1.011 (ref 1.005–1.030)
Squamous Epithelial / HPF: NONE SEEN (ref 0–5)
pH: 5 (ref 5.0–8.0)

## 2021-02-13 LAB — COMPREHENSIVE METABOLIC PANEL
ALT: 36 U/L (ref 0–44)
AST: 41 U/L (ref 15–41)
Albumin: 3.5 g/dL (ref 3.5–5.0)
Alkaline Phosphatase: 110 U/L (ref 38–126)
Anion gap: 7 (ref 5–15)
BUN: 53 mg/dL — ABNORMAL HIGH (ref 8–23)
CO2: 18 mmol/L — ABNORMAL LOW (ref 22–32)
Calcium: 8.2 mg/dL — ABNORMAL LOW (ref 8.9–10.3)
Chloride: 113 mmol/L — ABNORMAL HIGH (ref 98–111)
Creatinine, Ser: 1.43 mg/dL — ABNORMAL HIGH (ref 0.61–1.24)
GFR, Estimated: 50 mL/min — ABNORMAL LOW (ref >60)
Glucose, Bld: 125 mg/dL — ABNORMAL HIGH (ref 70–99)
Potassium: 3.7 mmol/L (ref 3.5–5.1)
Sodium: 138 mmol/L (ref 135–145)
Total Bilirubin: 0.6 mg/dL (ref 0.3–1.2)
Total Protein: 6.8 g/dL (ref 6.5–8.1)

## 2021-02-13 LAB — RESP PANEL BY RT-PCR (FLU A&B, COVID) ARPGX2
Influenza A by PCR: NEGATIVE
Influenza B by PCR: NEGATIVE
SARS Coronavirus 2 by RT PCR: NEGATIVE

## 2021-02-13 LAB — IRON AND TIBC
Iron: 31 ug/dL — ABNORMAL LOW (ref 45–182)
Saturation Ratios: 12 % — ABNORMAL LOW (ref 17.9–39.5)
TIBC: 258 ug/dL (ref 250–450)
UIBC: 227 ug/dL

## 2021-02-13 LAB — LACTIC ACID, PLASMA: Lactic Acid, Venous: 1.1 mmol/L (ref 0.5–1.9)

## 2021-02-13 LAB — MAGNESIUM: Magnesium: 2 mg/dL (ref 1.7–2.4)

## 2021-02-13 LAB — FERRITIN: Ferritin: 211 ng/mL (ref 24–336)

## 2021-02-13 LAB — GLUCOSE, CAPILLARY: Glucose-Capillary: 184 mg/dL — ABNORMAL HIGH (ref 70–99)

## 2021-02-13 MED ORDER — CARVEDILOL 6.25 MG PO TABS
6.2500 mg | ORAL_TABLET | Freq: Two times a day (BID) | ORAL | Status: DC
  Administered 2021-02-13 – 2021-02-16 (×7): 6.25 mg via ORAL
  Filled 2021-02-13 (×7): qty 1

## 2021-02-13 MED ORDER — VANCOMYCIN HCL 1500 MG/300ML IV SOLN
1500.0000 mg | Freq: Once | INTRAVENOUS | Status: AC
  Administered 2021-02-13: 20:00:00 1500 mg via INTRAVENOUS
  Filled 2021-02-13: qty 300

## 2021-02-13 MED ORDER — SODIUM CHLORIDE 0.9 % IV SOLN
INTRAVENOUS | Status: DC | PRN
  Administered 2021-02-13 – 2021-02-17 (×2): 250 mL via INTRAVENOUS
  Administered 2021-02-17 – 2021-02-18 (×2): 1000 mL via INTRAVENOUS

## 2021-02-13 MED ORDER — METRONIDAZOLE 500 MG PO TABS
500.0000 mg | ORAL_TABLET | Freq: Three times a day (TID) | ORAL | Status: DC
  Administered 2021-02-13 – 2021-02-15 (×5): 500 mg via ORAL
  Filled 2021-02-13 (×5): qty 1

## 2021-02-13 MED ORDER — FENTANYL CITRATE (PF) 100 MCG/2ML IJ SOLN: 12.5000 ug | INTRAMUSCULAR | Status: DC | PRN

## 2021-02-13 MED ORDER — INSULIN ASPART 100 UNIT/ML ~~LOC~~ SOLN
0.0000 [IU] | Freq: Three times a day (TID) | SUBCUTANEOUS | Status: DC
  Administered 2021-02-14: 3 [IU] via SUBCUTANEOUS
  Administered 2021-02-14: 09:00:00 1 [IU] via SUBCUTANEOUS
  Administered 2021-02-14: 16:00:00 2 [IU] via SUBCUTANEOUS
  Administered 2021-02-15: 09:00:00 1 [IU] via SUBCUTANEOUS
  Administered 2021-02-15: 2 [IU] via SUBCUTANEOUS
  Administered 2021-02-15 – 2021-02-16 (×2): 3 [IU] via SUBCUTANEOUS
  Administered 2021-02-16 (×2): 2 [IU] via SUBCUTANEOUS
  Administered 2021-02-18: 3 [IU] via SUBCUTANEOUS
  Administered 2021-02-19: 2 [IU] via SUBCUTANEOUS
  Filled 2021-02-13 (×12): qty 1

## 2021-02-13 MED ORDER — ACETAMINOPHEN 650 MG RE SUPP: 650.0000 mg | Freq: Four times a day (QID) | RECTAL | Status: DC | PRN

## 2021-02-13 MED ORDER — VANCOMYCIN HCL IN DEXTROSE 1-5 GM/200ML-% IV SOLN
1000.0000 mg | Freq: Once | INTRAVENOUS | Status: AC
  Administered 2021-02-13: 1000 mg via INTRAVENOUS
  Filled 2021-02-13: qty 200

## 2021-02-13 MED ORDER — TAMSULOSIN HCL 0.4 MG PO CAPS
0.4000 mg | ORAL_CAPSULE | Freq: Every day | ORAL | Status: DC
  Administered 2021-02-13 – 2021-02-22 (×9): 0.4 mg via ORAL
  Filled 2021-02-13 (×9): qty 1

## 2021-02-13 MED ORDER — VANCOMYCIN HCL 1250 MG/250ML IV SOLN
1250.0000 mg | INTRAVENOUS | Status: DC
  Filled 2021-02-13: qty 250

## 2021-02-13 MED ORDER — ROSUVASTATIN CALCIUM 20 MG PO TABS
40.0000 mg | ORAL_TABLET | Freq: Every day | ORAL | Status: DC
  Administered 2021-02-13 – 2021-02-21 (×9): 40 mg via ORAL
  Filled 2021-02-13 (×9): qty 2

## 2021-02-13 MED ORDER — OXYCODONE HCL 5 MG PO TABS
5.0000 mg | ORAL_TABLET | Freq: Four times a day (QID) | ORAL | Status: DC | PRN
  Administered 2021-02-13 – 2021-02-21 (×13): 5 mg via ORAL
  Filled 2021-02-13 (×14): qty 1

## 2021-02-13 MED ORDER — OXYCODONE HCL 5 MG PO TABS: 5.0000 mg | ORAL_TABLET | Freq: Once | ORAL | Status: DC

## 2021-02-13 MED ORDER — SODIUM CHLORIDE 0.9 % IV SOLN
1.0000 g | INTRAVENOUS | Status: DC
  Filled 2021-02-13: qty 10

## 2021-02-13 MED ORDER — SERTRALINE HCL 50 MG PO TABS
200.0000 mg | ORAL_TABLET | Freq: Every day | ORAL | Status: DC
  Administered 2021-02-14 – 2021-02-22 (×8): 200 mg via ORAL
  Filled 2021-02-13 (×8): qty 4

## 2021-02-13 MED ORDER — PANTOPRAZOLE SODIUM 40 MG PO TBEC
40.0000 mg | DELAYED_RELEASE_TABLET | Freq: Every day | ORAL | Status: DC
  Administered 2021-02-14 – 2021-02-22 (×8): 40 mg via ORAL
  Filled 2021-02-13 (×8): qty 1

## 2021-02-13 MED ORDER — ACETAMINOPHEN 325 MG PO TABS
650.0000 mg | ORAL_TABLET | Freq: Four times a day (QID) | ORAL | Status: DC | PRN
  Administered 2021-02-14 – 2021-02-16 (×3): 650 mg via ORAL
  Filled 2021-02-13 (×3): qty 2

## 2021-02-13 MED ORDER — LORATADINE 10 MG PO TABS
10.0000 mg | ORAL_TABLET | Freq: Every day | ORAL | Status: DC
  Administered 2021-02-14 – 2021-02-22 (×8): 10 mg via ORAL
  Filled 2021-02-13 (×8): qty 1

## 2021-02-13 MED ORDER — SODIUM CHLORIDE 0.9 % IV SOLN
2.0000 g | INTRAVENOUS | Status: DC
  Administered 2021-02-13 – 2021-02-20 (×8): 2 g via INTRAVENOUS
  Filled 2021-02-13 (×9): qty 20

## 2021-02-13 NOTE — Consult Note (Addendum)
Pharmacy Antibiotic Note  Nathan Singleton is a 80 y.o. male admitted on 02/13/2021 with chronic right great toe ulcer. Patient with PMH significant for insulin-dependent diabetes mellitus (A1c 8.7 02/2021), peripheral vascular disease, pacemaker placement, aortic stenosis s/p TAVR. Patient recently completed oral course of antibiotics (cephalexin 2/10 - 2/20) and noticed increase in swelling and redness over last few days. Pharmacy has been consulted for Vancomycin dosing for cellulitis-diabetic foot infection Foot Xray not c/f osteomyelitis  Patient is also ordered ceftriaxone 2 gram Q24 and Metronidazole PO 500 mg Q8H  Plan:  Will give additional 1500 mg IV vancomycin for total vancomycin loading dose of 2500 mg   Initiate maintenance regimen of 1250 mg Q24H for goal AUC 400-550  Anticipated AUC: 510  Scr used: 1.43  Continue to monitor patient follow renal function daily for possible dose adjustments   Height: 6' (182.9 cm) Weight: 113.4 kg (250 lb) IBW/kg (Calculated) : 77.6  Temp (24hrs), Avg:98.3 F (36.8 C), Min:98.2 F (36.8 C), Max:98.3 F (36.8 C)  Recent Labs  Lab 02/13/21 1130  WBC 3.2*  CREATININE 1.43*  LATICACIDVEN 1.1    Estimated Creatinine Clearance: 55.3 mL/min (A) (by C-G formula based on SCr of 1.43 mg/dL (H)).    Allergies  Allergen Reactions  . Atorvastatin Other (See Comments)    "bad for kidneys" Cramping   . Celecoxib Other (See Comments)    Other reaction(s): Other (See Comments) Kidney Problem Kidney Problem   . Dicyclomine Hcl Other (See Comments)    Stomach cramps  . Doxycycline Other (See Comments)  . Metformin Other (See Comments)    Antimicrobials this admission: 3/3 Vancomycin >>  3/3 Ceftriaxone >> 3/3 Metronidazole >>   Dose adjustments this admission: n/a  Microbiology results: 3/3 BCx: sent  Thank you for allowing pharmacy to be a part of this patient's care.  Dorothe Pea, PharmD, BCPS Clinical Pharmacist   02/13/2021 6:41 PM

## 2021-02-13 NOTE — ED Provider Notes (Signed)
[facility] Emergency Department Provider Note   ____________________________________________   I have reviewed the triage vital signs and the nursing notes.   HISTORY  Chief Complaint Abnormal Lab and Wound Infection   History limited by: Not Limited   HPI Nathan Singleton is a 80 y.o. male who presents to the emergency department today at the advice of his nephrologist because of concerns for recurrent infection to a right great toe ulcer.  Patient recently finished a course of oral antibiotics for an infection to the right great toe.  Ellene Route states that within the past few days they have noticed increased redness and swelling to the right foot.  It has been increasingly painful.  Saw nephrology yesterday who was concerned for recurrent infection recommended IV antibiotics.  Patient has not had any measured fevers.  No nausea or vomiting.  Records reviewed. Per medical record review patient has a history of CAD, DM  Past Medical History:  Diagnosis Date  . Aneurysm (arteriovenous) of coronary vessels   . Arthritis   . Ascending aortic aneurysm ([place])   . CAD (coronary artery disease)   . Diabetes mellitus without complication ([place])   . History of GI bleed   . Hyperlipidemia associated with type 2 diabetes mellitus ([place])   . Hypertension associated with diabetes ([place])   . Presence of permanent cardiac pacemaker   . PVD (peripheral vascular disease) ([place])   . S/P TAVR (transcatheter aortic valve replacement)     Patient Active Problem List   Diagnosis Date Noted  . Anemia 11/26/2020  . Chronic kidney disease (CKD) 11/26/2020  . Claudication ([place]) 11/26/2020  . Contusion of right thigh 11/26/2020  . COPD (chronic obstructive pulmonary disease) ([place]) 11/26/2020  . Depression 11/26/2020  . Dyspnea 11/26/2020  . Erectile dysfunction 11/26/2020  . GERD without esophagitis 11/26/2020  . Gout 11/26/2020  . Hyperlipidemia, acquired 11/26/2020  .  Hyperlipidemia 11/26/2020  . Myocardial infarction ([place]) 11/26/2020  . Personal history of other diseases of urinary system 11/26/2020  . Right leg swelling 11/26/2020  . Sleep apnea 11/26/2020  . Varicose veins 11/26/2020  . Venous insufficiency 11/26/2020  . Vitamin D deficiency 11/26/2020  . Chest pain 11/21/2020  . Thrombocytopenia ([place]) 11/21/2020  . S/P TAVR (transcatheter aortic valve replacement)   . PVD (peripheral vascular disease) ([place])   . Presence of permanent cardiac pacemaker   . Hypertension associated with diabetes ([place])   . Hyperlipidemia associated with type 2 diabetes mellitus ([place])   . History of GI bleed   . CAD (coronary artery disease)   . 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 Pacemaker St Jude device 11/19/2020  . Peripheral vascular disease, unspecified ([place]) stents to popliteal arteries many years ago 11/19/2020  . History of gingival bleeding 11/19/2020  . Coronary artery disease involving coronary bypass graft of native heart with angina pectoris ([place]) 11/19/2020  . 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 Hypertension   . Diabetes mellitus without complication ([place])   . Arthritis   . Closed nondisplaced fracture of greater trochanter of right femur ([place]) 01/25/2020  . DDD (degenerative disc disease), lumbar 12/10/2019  . Chronic low back pain 12/09/2019  . 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 Urinary retention 12/09/2019  . Hearing loss 05/11/2019  . Obesity 04/07/2019  . 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 Bipolar disorder ([place]) 01/01/2019  .  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 Heart block 04/25/2018  . Calculus of gallbladder with acute cholecystitis without obstruction 09/21/2017  . Ulcer of jejunum 03/23/2014  . Insomnia 03/22/2014  . Blisters of multiple sites 01/18/2014  .  Cellulitis 01/18/2014  . OA (osteoarthritis) of knee 06/09/2013  . Status post percutaneous transluminal coronary angioplasty 01/14/2011    Past Surgical History:  Procedure Laterality Date  . Ankle repaired Right   . Carpal Tunnel repaired Left   . CATARACT EXTRACTION Bilateral   . COLONOSCOPY    . Copillar implant     . DG CHOLECYSTOGRAPHY GALL BLADDER ([place] HX)    . GASTRIC BYPASS    . heart valve replaced    . PACEMAKER GENERATOR CHANGE    . REPLACEMENT TOTAL KNEE BILATERAL    . RTC    . STENT PLACE LEFT URETER ([facility] HX) Right    Right Leg  . Toe nail removed Bilateral     Prior to Admission medications   Medication Sig Start Date End Date Taking? Authorizing Provider  allopurinol (ZYLOPRIM) 100 MG tablet Take 100 mg by mouth daily. 10/19/20   [provider]  amLODipine (NORVASC) 10 MG tablet Take 10 mg by mouth daily.    [provider]  BAYER ASPIRIN EC LOW DOSE 81 MG EC tablet Take 81 mg by mouth daily. 11/11/20   [provider]  calcium citrate-vitamin D (CITRACAL+D) 315-200 MG-UNIT tablet Take 1 tablet by mouth daily. 11/11/20   [provider]  carvedilol (COREG) 6.25 MG tablet Take 6.25 mg by mouth 2 (two) times daily. 11/11/20   [provider]  cetirizine (ZYRTEC) 10 MG tablet Take 10 mg by mouth daily. 11/11/20   [provider]  D-5000 125 MCG (5000 UT) TABS Take 1 tablet by mouth daily. 11/11/20   [provider]  docusate sodium (COLACE) 100 MG capsule Take 200 mg by mouth 2 (two) times daily. 11/11/20   [provider]  furosemide (LASIX) 20 MG tablet Take 20 mg by mouth daily. 10/19/20   [provider]  HYDROcodone-acetaminophen (NORCO) 7.5-325 MG tablet Take 1-2 tablets by mouth every 6 (six) hours as needed for moderate pain or severe pain. 11/22/20   Swayze, Ava, DO  insulin glargine (LANTUS) 100 UNIT/ML Solostar Pen Inject 25 Units into the skin at bedtime.    [provider]  LINZESS 145 MCG CAPS capsule Take 145 mcg by mouth daily. 11/11/20   [provider]  liraglutide (VICTOZA) 18 MG/3ML SOPN Inject 1.8 mg into the skin daily.    [provider]  multivitamin (ONE-A-DAY MEN'S) TABS tablet Take 1 tablet by mouth daily. 11/11/20   [provider]  pantoprazole (PROTONIX) 40 MG tablet Take 1 tablet (40 mg total) by mouth daily. 02/11/21   [name], MD  rosuvastatin (CRESTOR) 20 MG tablet Take 2 tablets (40 mg total) by mouth daily. 11/22/20 11/22/21  Swayze, Ava, DO  sertraline (ZOLOFT) 100 MG tablet Take 200 mg by mouth daily. 11/11/20   [provider]  tamsulosin (FLOMAX) 0.4 MG CAPS capsule Take 0.4 mg by mouth daily. 11/11/20   [provider]    Allergies Atorvastatin, Celecoxib, Dicyclomine hcl, Doxycycline, and Metformin  Family History  Problem Relation Age of Onset  . Heart disease Mother   . Alzheimer's disease Mother   . Heart disease Father   . Diabetes Father     Social History Social History   Tobacco Use  .  Smoking status: Former Research scientist (life sciences)  . Smokeless tobacco: Never Used  Substance Use Topics  . Alcohol use: Not Currently  . Drug use: Not Currently    Review of Systems Constitutional: No fever/chills Eyes: No visual changes. ENT: No sore throat. Cardiovascular: Denies chest pain. Respiratory: Denies shortness of breath. Gastrointestinal: No abdominal pain.  No nausea, no vomiting.  No diarrhea.   Genitourinary: Negative for dysuria. Musculoskeletal: Positive for right foot swelling and pain. Skin: Positive for ulcer to right great toe.  Neurological: Negative for headaches, focal weakness or numbness.  ____________________________________________   PHYSICAL EXAM:  VITAL SIGNS: ED Triage Vitals  Enc Vitals Group     BP 02/13/21 1132 132/80     Pulse Rate 02/13/21 1132 62     Resp 02/13/21 1132 17     Temp 02/13/21 1132 98.2 F (36.8 C)     Temp Source  02/13/21 1132 Oral     SpO2 02/13/21 1132 100 %     Weight 02/13/21 1133 250 lb (113.4 kg)     Height 02/13/21 1133 6' (1.829 m)     Head Circumference --      Peak Flow --      Pain Score 02/13/21 1132 7   Constitutional: Alert and oriented.  Eyes: Conjunctivae are normal.  ENT      Head: Normocephalic and atraumatic.      Nose: No congestion/rhinnorhea.      Mouth/Throat: Mucous membranes are moist.      Neck: No stridor. Hematological/Lymphatic/Immunilogical: No cervical lymphadenopathy. Cardiovascular: Normal rate, regular rhythm.  No murmurs, rubs, or gallops.  Respiratory: Normal respiratory effort without tachypnea nor retractions. Breath sounds are clear and equal bilaterally. No wheezes/rales/rhonchi. Gastrointestinal: Soft and non tender. No rebound. No guarding.  Genitourinary: Deferred Musculoskeletal: Normal range of motion in all extremities. No lower extremity edema. Neurologic:  Normal speech and language. No gross focal neurologic deficits are appreciated.  Skin:  Ulcer to base of right great toe. Erythema to right foot.  Psychiatric: Mood and affect are normal. Speech and behavior are normal. Patient exhibits appropriate insight and judgment.  ____________________________________________    LABS (pertinent positives/negatives)  Lactic acid 1.1 CBC wbc 3.2, hgb 8.7, plt 100 CMP na 138, k 3.7, glu 125, cr 1.43  ____________________________________________   EKG  None  ____________________________________________    RADIOLOGY  Right foot No clear evidence of osteomyelitis  ____________________________________________   PROCEDURES  Procedures  ____________________________________________   INITIAL IMPRESSION / ASSESSMENT AND PLAN / ED COURSE  Pertinent labs & imaging results that were available during my care of the patient were reviewed by me and considered in my medical decision making (see chart for details).   Patient presented to the  emergency department today at the request of nephrology because of concerns for recurrent infection of the right great toe.  X-ray was obtained which did not show osteohowever given recurrence of infection I do have concerns for failure of oral antibiotics.  Will start IV antibiotics plan admission.  ____________________________________________   FINAL CLINICAL IMPRESSION(S) / ED DIAGNOSES  Final diagnoses:  Cellulitis, unspecified cellulitis site     Note: This dictation was prepared with Dragon dictation. Any transcriptional errors that result from this process are unintentional     Nance Pear, MD 02/13/21 1520

## 2021-02-13 NOTE — ED Notes (Signed)
Pt presents to ED with c/o of being sent by PCP due to abnormal labs and also a recurrent R great toe infection, pt presents with bandage in place as well as post op shoe. Pt sates this has been ongoing for "a long time'. Pt states been admitted for this as well and has taken ABX outpatient with not much change. Pt denies fevers, chills, N/V. Pt is A&Ox4. Pt is HOH but is A&Ox4 at this time.

## 2021-02-13 NOTE — H&P (Signed)
History and Physical    PLEASE NOTE THAT DRAGON DICTATION SOFTWARE WAS USED IN THE CONSTRUCTION OF THIS NOTE.   Nathan Singleton YTK:[id number] DOB: 12-03-1941 DOA: 02/13/2021  PCP: Ranae Plumber, [place] Patient coming from: home   I have personally briefly reviewed patient's old medical records in [place]  Chief Complaint: Right foot pain  HPI: Nathan Singleton is a 80 y.o. male with medical history significant for type 2 diabetes mellitus with most recent hemoglobin A1c 7.5%, severe aortic stenosis status post TAVR, pacemaker, hypertension, chronic anemia with baseline hemoglobin 10-11, who is admitted to [facility] on 02/13/2021 with infected right diabetic foot ulcer after presenting from home to [facility] ED complaining of progressive right foot pain.   The patient reports development of a foot ulcer on his right great toe starting approximately 3 weeks ago.  He conveys that this has been managed by his outpatient podiatrist, Dr. Luana Shu.  Associated management has included, per patient's report, a 10-day course of oral antibiotics as an outpatient, which concluded 7 days ago.  During the course of this 10-day antibiotic regimen, of currently unclear specific antibiotic regimen, which patient reports was a monotherapy, he notes significant improvement in the erythema, edema, swelling, increased warmth associated with the distal and lateral aspect of his right great toe.  However, since completing this antibiotic regimen 7 days ago, he has noted subsequent worsening of the associated erythema, swelling, tenderness, increased warmth associate with his right great toe.  He also notes recent drainage associated with this.  Great toe lesion, but is unsure if the nature of this.  Is more serous versus purulent.  Denies any new onset numbness or paresthesias.  Denies any new onset cold sensation associated with the right lower extremity, and also denies any associated crepitus.  Denies  any associated subjective fever, chills, rigors, or generalized myalgias.  Denies any known preceding trauma to the right foot.  Per chart review, it appears that the patient was evaluated as an outpatient by vascular surgery in December 2021 in the context of a diagnosis of peripheral vascular disease.  The patient denies any recent headache, neck stiffness, rhinitis, rhinorrhea, sore throat, shortness of breath, wheezing, cough, nausea, vomiting, abdominal pain, or diarrhea.  No recent traveling or known COVID-19 exposures.  Denies any recent dysuria, gross hematuria, or change in urinary urgency/frequency.  Additionally, the patient denies any recent chest pain, diaphoresis, or palpitations.     ED Course:  Vital signs in the ED were notable for the following: Temperature max 98.0, heart rate 55-62, blood pressure 132/80 -140/90; respiratory rate 17-19, oxygen saturation 97 to 100% on room air.  Labs were notable for the following: CMP was notable for the following: Sodium 138, bicarbonate 18, anion gap 7, BUN 53, creatinine 1.43 relative to baseline creatinine range of 1.1-1.3, with most recent prior serum creatinine data point noted to be 1.44 on 01/13/2021, glucose 125.  CBC notable for white blood cell count of 3200, hemoglobin 8.7 with associated normocytic, normochromic findings as well as RDW within normal limits, platelet count 100.  Screening nasopharyngeal COVID-19 PCR performed in the emergency department today and found to be negative.  Urinalysis showed no evidence of white blood cells or bacteria.  Plain films of the right foot showed ulceration of the right great toe without bony erosion or radiopaque foreign body, and showed no evidence of acute fracture or dislocation.  While in the ED, the following were administered: IV vancomycin.  Subsequently, the  patient was admitted to the MedSurg floor for further evaluation and management of suspected infected right diabetic foot ulcer in  the setting of suspicion for failed outpatient antibiotic course.     Review of Systems: As per HPI otherwise 10 point review of systems negative.   Past Medical History:  Diagnosis Date   Aneurysm (arteriovenous) of coronary vessels    Arthritis    Ascending aortic aneurysm (HCC)    CAD (coronary artery disease)    Diabetes mellitus without complication (HCC)    History of GI bleed    Hyperlipidemia associated with type 2 diabetes mellitus (HCC)    Hypertension associated with diabetes (HCC)    Presence of permanent cardiac pacemaker    PVD (peripheral vascular disease) (HCC)    S/P TAVR (transcatheter aortic valve replacement)     Past Surgical History:  Procedure Laterality Date   Ankle repaired Right    Carpal Tunnel repaired Left    CATARACT EXTRACTION Bilateral    COLONOSCOPY     Copillar implant      DG CHOLECYSTOGRAPHY GALL BLADDER ([facility] HX)     GASTRIC BYPASS     heart valve replaced     PACEMAKER GENERATOR CHANGE     REPLACEMENT TOTAL KNEE BILATERAL     RTC     STENT PLACE LEFT URETER ([facility] HX) Right    Right Leg   Toe nail removed Bilateral     Social History:  reports that he has quit smoking. He has never used smokeless tobacco. He reports previous alcohol use. He reports previous drug use.   Allergies  Allergen Reactions   Atorvastatin Other (See Comments)    "bad for kidneys" Cramping    Celecoxib Other (See Comments)    Other reaction(s): Other (See Comments) Kidney Problem Kidney Problem    Dicyclomine Hcl Other (See Comments)    Stomach cramps   Doxycycline Other (See Comments)   Metformin Other (See Comments)    Family History  Problem Relation Age of Onset   Heart disease Mother    Alzheimer's disease Mother    Heart disease Father    Diabetes Father      Prior to Admission medications   Medication Sig Start Date End Date Taking? Authorizing Provider  calcium citrate-vitamin D (CITRACAL+D)  315-200 MG-UNIT tablet Take 1 tablet by mouth daily. 11/11/20  Yes [provider]  D-5000 125 MCG (5000 UT) TABS Take 1 tablet by mouth daily. 11/11/20  Yes [provider]  insulin glargine (LANTUS) 100 UNIT/ML Solostar Pen Inject 25 Units into the skin at bedtime.   Yes [provider]  liraglutide (VICTOZA) 18 MG/3ML SOPN Inject 1.8 mg into the skin daily.   Yes [provider]  multivitamin (ONE-A-DAY MEN'S) TABS tablet Take 1 tablet by mouth daily. 11/11/20  Yes [provider]  allopurinol (ZYLOPRIM) 100 MG tablet Take 100 mg by mouth daily. 10/19/20   [provider]  amLODipine (NORVASC) 10 MG tablet Take 10 mg by mouth daily.    [provider]  baclofen (LIORESAL) 10 MG tablet Take 10 mg by mouth 2 (two) times daily. 02/12/21   [provider]  BAYER ASPIRIN EC LOW DOSE 81 MG EC tablet Take 81 mg by mouth daily. 11/11/20   [provider]  carvedilol (COREG) 6.25 MG tablet Take 6.25 mg by mouth 2 (two) times daily. 11/11/20   [provider]  cetirizine (ZYRTEC) 10 MG tablet Take 10 mg by mouth daily.  11/11/20   [provider]  diphenoxylate-atropine (LOMOTIL) 2.5-0.025 MG tablet Take 1 tablet by mouth every 6 (six) hours as needed for diarrhea or loose stools. 01/01/21   [provider]  docusate sodium (COLACE) 100 MG capsule Take 200 mg by mouth 2 (two) times daily. 11/11/20   [provider]  furosemide (LASIX) 20 MG tablet Take 20 mg by mouth daily. 10/19/20   [provider]  gabapentin (NEURONTIN) 100 MG capsule Take 100 mg by mouth daily. 01/29/21   [provider]  HYDROcodone-acetaminophen (NORCO) 7.5-325 MG tablet Take 1-2 tablets by mouth every 6 (six) hours as needed for moderate pain or severe pain. 11/22/20   Swayze, Ava, DO  isosorbide mononitrate (IMDUR) 30 MG 24 hr tablet Take 30 mg by mouth daily. 02/12/21   [provider]  LINZESS  145 MCG CAPS capsule Take 145 mcg by mouth daily. 11/11/20   [provider]  losartan (COZAAR) 25 MG tablet Take 25 mg by mouth daily. 02/13/21   [provider]  pantoprazole (PROTONIX) 40 MG tablet Take 1 tablet (40 mg total) by mouth daily. 02/11/21   [name], MD  rosuvastatin (CRESTOR) 40 MG tablet Take 40 mg by mouth daily. 02/13/21   [provider]  sertraline (ZOLOFT) 100 MG tablet Take 200 mg by mouth daily. 11/11/20   [provider]  tamsulosin (FLOMAX) 0.4 MG CAPS capsule Take 0.4 mg by mouth daily. 11/11/20   [provider]     Objective    Physical Exam: Vitals:   02/13/21 1133 02/13/21 1500 02/13/21 1701 02/13/21 2053  BP:  140/90 140/90 (!) 125/47  Pulse:  60 (!) 55 64  Resp:  $Remo'19 18 20  'JNeJp$ Temp:   98.3 F (36.8 C) 98.4 F (36.9 C)  TempSrc:   Oral Oral  SpO2:  97% 99% 100%  Weight: 113.4 kg     Height: 6' (1.829 m)       General: appears to be stated age; alert, oriented; hard of hearing Skin: warm, dry; erythema, swelling, increased warmth associated with distal lateral aspect of the right great toe Head:  AT/[place] Mouth:  Oral mucosa membranes appear moist, normal dentition Neck: supple; trachea midline Heart:  RRR; did not appreciate any M/R/G Lungs: CTAB, did not appreciate any wheezes, rales, or rhonchi Abdomen: + BS; soft, ND, NT Vascular: 2+ pedal pulses b/l; 2+ radial pulses b/l Extremities: Erythema, swelling, tenderness associated with right great toe, as further noted above, no muscle wasting Neuro: strength and sensation intact in upper and lower extremities b/l    Labs on Admission: I have personally reviewed following labs and imaging studies  CBC: Recent Labs  Lab 02/13/21 1130  WBC 3.2*  NEUTROABS 2.2  HGB 8.7*  HCT 26.5*  MCV 94.6  PLT 527*   Basic Metabolic Panel: Recent Labs  Lab 02/13/21 1130 02/13/21 1136  NA 138  --   K 3.7  --   CL 113*  --   CO2 18*  --   GLUCOSE 125*  --    BUN 53*  --   CREATININE 1.43*  --   CALCIUM 8.2*  --   MG  --  2.0   GFR: Estimated Creatinine Clearance: 55.3 mL/min (A) (by C-G formula based on SCr of 1.43 mg/dL (H)). Liver Function Tests: Recent Labs  Lab 02/13/21 1130  AST 41  ALT 36  ALKPHOS 110  BILITOT 0.6  PROT 6.8  ALBUMIN 3.5   No  results for input(s): LIPASE, AMYLASE in the last 168 hours. No results for input(s): AMMONIA in the last 168 hours. Coagulation Profile: No results for input(s): INR, PROTIME in the last 168 hours. Cardiac Enzymes: No results for input(s): CKTOTAL, CKMB, CKMBINDEX, TROPONINI in the last 168 hours. BNP (last 3 results) No results for input(s): PROBNP in the last 8760 hours. HbA1C: No results for input(s): HGBA1C in the last 72 hours. CBG: Recent Labs  Lab 02/13/21 2126  GLUCAP 184*   Lipid Profile: No results for input(s): CHOL, HDL, LDLCALC, TRIG, CHOLHDL, LDLDIRECT in the last 72 hours. Thyroid Function Tests: No results for input(s): TSH, T4TOTAL, FREET4, T3FREE, THYROIDAB in the last 72 hours. Anemia Panel: Recent Labs    02/13/21 1136  FERRITIN 211  TIBC 258  IRON 31*   Urine analysis:    Component Value Date/Time   COLORURINE STRAW (A) 02/13/2021 1507   APPEARANCEUR CLEAR (A) 02/13/2021 1507   LABSPEC 1.011 02/13/2021 1507   PHURINE 5.0 02/13/2021 1507   GLUCOSEU NEGATIVE 02/13/2021 1507   HGBUR NEGATIVE 02/13/2021 1507   BILIRUBINUR NEGATIVE 02/13/2021 1507   KETONESUR NEGATIVE 02/13/2021 1507   PROTEINUR NEGATIVE 02/13/2021 1507   NITRITE NEGATIVE 02/13/2021 1507   LEUKOCYTESUR NEGATIVE 02/13/2021 1507    Radiological Exams on Admission: DG Foot Complete Right  Result Date: 02/13/2021 CLINICAL DATA:  Right great toe ulcer. EXAM: RIGHT FOOT COMPLETE - 3+ VIEW COMPARISON:  No prior. FINDINGS: Soft tissue swelling. Ulceration of the right great toe noted. No radiopaque foreign body. No adjacent bony erosion. If osteomyelitis remains a clinical concern MRI  can be obtained. Diffuse degenerative change. No fracture or dislocation. Peripheral vascular calcification. IMPRESSION: 1. Ulceration noted about the right great toe. No radiopaque foreign body. No adjacent bony erosion. If osteomyelitis remains a clinical concern MRI can be obtained. 2.  Diffuse degenerative change.  No acute bony abnormality. 3.  Peripheral vascular disease. Electronically Signed   By: Marcello Moores  Register   On: 02/13/2021 14:59     Assessment/Plan   Nathan Singleton is a 80 y.o. male with medical history significant for type 2 diabetes mellitus with most recent hemoglobin A1c 7.5%, severe aortic stenosis status post TAVR, pacemaker, hypertension, chronic anemia with baseline hemoglobin 10-11, who is admitted to [facility] on 02/13/2021 with infected right diabetic foot ulcer after presenting from home to [facility] ED complaining of progressive right foot pain.   Principal Problem:   Diabetic foot ulcer ([place]) Active Problems:   Hypertension   DM2 (diabetes mellitus, type 2) ([place])   Hyperlipidemia associated with type 2 diabetes mellitus ([place])   Anemia   BPH (benign prostatic hyperplasia)     #) Infected right diabetic foot ulcer: Diagnosed 3 weeks right great toe erythema, increased tenderness, increased warmth, swelling increased drainage, that initially improved with a unclear monotherapy of oral antibiotics as an outpatient, but for subsequent worsening of the symptoms, prompting the patient to present to [facility] ED for further evaluation of the above.  No evidence of preceding trauma, although predisposing factors include history of underlying diabetes as well as diagnosis of peripheral vascular disease.  Of note, presenting plain films of the right foot showed evidence of ulceration of the right great toe, but without evidence of osteomyelitis.  No clinical or physical exam evidence to suggest necrotizing fasciitis.  Aside from leukopenia, no additional SIRS  criteria met at this time, therefore criteria for sepsis is not met at the present time.  In the setting  of worsening of this diabetic foot ulcer in spite of outpatient oral antibiotics, the decision was made to admit the patient to the hospital for further evaluation and management of infected right diabetic foot ulcer including initiation of IV antibiotics.  I have formally consulted the on-call podiatrist, Dr. Vickki Muff, who will see the patient.   Plan: Continue IV vancomycin.  IV Rocephin.  On-call podiatrist formally consulted, as further described above.  Repeat CBC with differential in the morning.  Check blood cultures x2.  As needed IV fentanyl.  Will attempt additional chart review to evaluate the findings of recent visit to outpatient vascular surgery clinic in December 2021.      #) Type 2 diabetes mellitus: Appears to be relatively well controlled, with most recent hemoglobin A1c noted to be 7.5% when checked on 11/22/2020.  Outpatient insulin regimen consists of Lantus 25 units subcu nightly.  Additionally, he is on Victoza at home.  Presenting blood sugar noted to be 125.  Plan: For now, will refrain from initiation of basal insulin, but closely monitor ensuing blood sugars via Accu-Cheks before every meal and at bedtime with associated low-dose sliding scale insulin.     #) Hyperlipidemia: On high intensity rosuvastatin as an outpatient.  Plan: Continue home statin.     #) Essential hypertension: Outpatient antihypertensive regimen includes Norvasc, Coreg, Imdur, losartan, Lasix, as well as antihypertensive implications from Flomax.  In the setting of infectious process, will proceed conservatively in terms of resumption of hypertensive medications.  Plan: For now, will resume Coreg as well as Flomax, but hold the remainder of the patient's home blood pressure medications, will closely monitor ensuing blood pressure via routine vital signs.     #) Allergic rhinitis: In the  setting of seasonal allergies, the patient reports that he is on daily Zyrtec at home.  Plan: Continue home antihistamine.     #) BPH: On Flomax at home.  Plan: Continue home Flomax.  Monitor strict I's and O's and daily weights.  Repeat BMP in the morning.      #) Chronic anemia: Suspect that this is on the basis of anemia of chronic disease, with baseline hemoglobin range noted to be 10-11.  Presenting CBC reflects hemoglobin value slightly lower than this baseline range, without any evidence of active bleed, and findings that are associated with normocytic, normochromic criteria as well as a nonelevated RDW, all of which suggest against the possibility of acute blood loss anemia.  Potential mild worsening patient's anemia on the basis of exacerbation from anemia of acute illness in the form of presenting infected right diabetic foot ulcer.  Not on any blood thinning agents as an outpatient, including no aspirin.   Plan: Add on the following labs: Iron studies, folic acid, MMA, reticulocyte count, and peripheral smear.  Repeat CBC in the morning.  Check INR.  SCDs.    DVT prophylaxis: SCDs Code Status: Patient conveys his wish to be DNR/DNI Family Communication: Discussed the patient's case with his fiance, who is present at bedside. Disposition Plan: Per Rounding Team Consults called: On-call podiatrist, Dr. Vickki Muff, has been formally consulted, as further described above Admission status: Inpatient; MedSurg     Of note, this patient was added by me to the following Admit List/Treatment Team: armcadmits.      PLEASE NOTE THAT DRAGON DICTATION SOFTWARE WAS USED IN THE CONSTRUCTION OF THIS NOTE.   [facility] [facility] Pager [phone] From 12PM - 12AM  Otherwise, please contact night-coverage  [email or web address] Password [facility]   02/13/2021, 11:33 PM

## 2021-02-13 NOTE — ED Triage Notes (Addendum)
Pt comes into the ED via EMS from home, saw nephrologist yesterday , had elevated WBC and told to come to the ED today. Pt only complaint it fatigue. 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

## 2021-02-14 DIAGNOSIS — E11621 Type 2 diabetes mellitus with foot ulcer: Secondary | ICD-10-CM | POA: Diagnosis not present

## 2021-02-14 DIAGNOSIS — Z794 Long term (current) use of insulin: Secondary | ICD-10-CM | POA: Diagnosis not present

## 2021-02-14 DIAGNOSIS — L0390 Cellulitis, unspecified: Secondary | ICD-10-CM | POA: Diagnosis not present

## 2021-02-14 DIAGNOSIS — L97519 Non-pressure chronic ulcer of other part of right foot with unspecified severity: Secondary | ICD-10-CM | POA: Diagnosis not present

## 2021-02-14 DIAGNOSIS — E1142 Type 2 diabetes mellitus with diabetic polyneuropathy: Secondary | ICD-10-CM

## 2021-02-14 DIAGNOSIS — N400 Enlarged prostate without lower urinary tract symptoms: Secondary | ICD-10-CM | POA: Diagnosis present

## 2021-02-14 LAB — CBC WITH DIFFERENTIAL/PLATELET
Abs Immature Granulocytes: 0 10*3/uL (ref 0.00–0.07)
Basophils Absolute: 0 10*3/uL (ref 0.0–0.1)
Basophils Relative: 1 %
Eosinophils Absolute: 0.1 10*3/uL (ref 0.0–0.5)
Eosinophils Relative: 5 %
HCT: 26.1 % — ABNORMAL LOW (ref 39.0–52.0)
Hemoglobin: 8.4 g/dL — ABNORMAL LOW (ref 13.0–17.0)
Immature Granulocytes: 0 %
Lymphocytes Relative: 27 %
Lymphs Abs: 0.8 10*3/uL (ref 0.7–4.0)
MCH: 30.5 pg (ref 26.0–34.0)
MCHC: 32.2 g/dL (ref 30.0–36.0)
MCV: 94.9 fL (ref 80.0–100.0)
Monocytes Absolute: 0.2 10*3/uL (ref 0.1–1.0)
Monocytes Relative: 8 %
Neutro Abs: 1.7 10*3/uL (ref 1.7–7.7)
Neutrophils Relative %: 59 %
Platelets: 102 10*3/uL — ABNORMAL LOW (ref 150–400)
RBC: 2.75 MIL/uL — ABNORMAL LOW (ref 4.22–5.81)
RDW: 13.4 % (ref 11.5–15.5)
WBC: 2.9 10*3/uL — ABNORMAL LOW (ref 4.0–10.5)
nRBC: 0 % (ref 0.0–0.2)

## 2021-02-14 LAB — PROTIME-INR
INR: 1.3 — ABNORMAL HIGH (ref 0.8–1.2)
Prothrombin Time: 15.7 seconds — ABNORMAL HIGH (ref 11.4–15.2)

## 2021-02-14 LAB — COMPREHENSIVE METABOLIC PANEL
ALT: 28 U/L (ref 0–44)
AST: 27 U/L (ref 15–41)
Albumin: 3 g/dL — ABNORMAL LOW (ref 3.5–5.0)
Alkaline Phosphatase: 83 U/L (ref 38–126)
Anion gap: 5 (ref 5–15)
BUN: 40 mg/dL — ABNORMAL HIGH (ref 8–23)
CO2: 20 mmol/L — ABNORMAL LOW (ref 22–32)
Calcium: 8.1 mg/dL — ABNORMAL LOW (ref 8.9–10.3)
Chloride: 115 mmol/L — ABNORMAL HIGH (ref 98–111)
Creatinine, Ser: 1.28 mg/dL — ABNORMAL HIGH (ref 0.61–1.24)
GFR, Estimated: 57 mL/min — ABNORMAL LOW (ref >60)
Glucose, Bld: 187 mg/dL — ABNORMAL HIGH (ref 70–99)
Potassium: 3.8 mmol/L (ref 3.5–5.1)
Sodium: 140 mmol/L (ref 135–145)
Total Bilirubin: 0.4 mg/dL (ref 0.3–1.2)
Total Protein: 6 g/dL — ABNORMAL LOW (ref 6.5–8.1)

## 2021-02-14 LAB — MAGNESIUM: Magnesium: 2.1 mg/dL (ref 1.7–2.4)

## 2021-02-14 LAB — LACTIC ACID, PLASMA: Lactic Acid, Venous: 1.1 mmol/L (ref 0.5–1.9)

## 2021-02-14 LAB — GLUCOSE, CAPILLARY
Glucose-Capillary: 109 mg/dL — ABNORMAL HIGH (ref 70–99)
Glucose-Capillary: 141 mg/dL — ABNORMAL HIGH (ref 70–99)
Glucose-Capillary: 209 mg/dL — ABNORMAL HIGH (ref 70–99)
Glucose-Capillary: 173 mg/dL — ABNORMAL HIGH (ref 70–99)
Glucose-Capillary: 152 mg/dL — ABNORMAL HIGH (ref 70–99)

## 2021-02-14 MED ORDER — ENSURE MAX PROTEIN PO LIQD
11.0000 [oz_av] | Freq: Every day | ORAL | Status: DC
  Administered 2021-02-14 – 2021-02-22 (×7): 11 [oz_av] via ORAL
  Filled 2021-02-14: qty 330

## 2021-02-14 MED ORDER — VANCOMYCIN HCL 1500 MG/300ML IV SOLN
1500.0000 mg | INTRAVENOUS | Status: DC
  Administered 2021-02-14 – 2021-02-16 (×3): 1500 mg via INTRAVENOUS
  Filled 2021-02-14 (×4): qty 300

## 2021-02-14 MED ORDER — INSULIN DETEMIR 100 UNIT/ML ~~LOC~~ SOLN
10.0000 [IU] | Freq: Two times a day (BID) | SUBCUTANEOUS | Status: DC
  Administered 2021-02-14 – 2021-02-19 (×10): 10 [IU] via SUBCUTANEOUS
  Filled 2021-02-14 (×12): qty 0.1

## 2021-02-14 MED ORDER — ADULT MULTIVITAMIN W/MINERALS CH
1.0000 | ORAL_TABLET | Freq: Every day | ORAL | Status: DC
  Administered 2021-02-15 – 2021-02-21 (×6): 1 via ORAL
  Filled 2021-02-14 (×6): qty 1

## 2021-02-14 NOTE — Progress Notes (Signed)
PROGRESS NOTE    Nathan Singleton  [id number] DOB: 10-05-1941 DOA: 02/13/2021 PCP: Ranae Plumber, PA    Brief Narrative:  Patient with type 2 diabetes, severe aortic stenosis status post TAVR, pacemaker, hypertension, chronic anemia presented to the ER with increasing pain and swelling of the right foot especially right great toe.  Patient does have history of chronic right foot ulceration and recently worsening.  X-ray did not show osteomyelitis.   Assessment & Plan:   Principal Problem:   Diabetic foot ulcer ([place]) Active Problems:   Hypertension   DM2 (diabetes mellitus, type 2) ([place])   Hyperlipidemia associated with type 2 diabetes mellitus ([place])   Anemia   BPH (benign prostatic hyperplasia)  Diabetic foot ulceration/diabetic toe with spreading cellulitis: Significant surrounding soft tissue swelling, continue vancomycin and Rocephin.  Blood cultures pending.  No local cultures available. Vascular surgery consultations pending Followed by podiatry surgery, possible debridement after vascular surgery clearance. Bone scan ordered to look for osteomyelitis.  Type 2 diabetes: A1c 7.5.  On insulin and Victoza at home.  Continue.  Resume long-acting insulin.  Hyperlipidemia: Stable on a statin.  Tolerating.   DVT prophylaxis: SCDs Start: 02/13/21 1800   Code Status: DNR Family Communication: No family at bedside Disposition Plan: Status is: Inpatient  Remains inpatient appropriate because:IV treatments appropriate due to intensity of illness or inability to take PO and Inpatient level of care appropriate due to severity of illness   Dispo: The patient is from: Home              Anticipated d/c is to: Home              Patient currently is not medically stable to d/c.   Difficult to place patient No         Consultants:   Vascular surgery  Podiatry surgery  Procedures:   None  Antimicrobials:  Antibiotics Given (last 72 hours)    Date/Time Action  Medication Dose Rate   02/13/21 1602 New Bag/Given   vancomycin (VANCOCIN) IVPB 1000 mg/200 mL premix 1,000 mg 200 mL/hr   02/13/21 2010 New Bag/Given   vancomycin (VANCOREADY) IVPB 1500 mg/300 mL 1,500 mg 150 mL/hr   02/13/21 2143 New Bag/Given   cefTRIAXone (ROCEPHIN) 2 g in sodium chloride 0.9 % 100 mL IVPB 2 g 200 mL/hr   02/13/21 2156 Given   metroNIDAZOLE (FLAGYL) tablet 500 mg 500 mg    02/14/21 0519 Given   metroNIDAZOLE (FLAGYL) tablet 500 mg 500 mg          Subjective: Patient was seen and examined.  No overnight events.  Denies any complaints.  Mild pain on the right toe.  Objective: Vitals:   02/13/21 2053 02/14/21 0413 02/14/21 0424 02/14/21 0833  BP: (!) 125/47 (!) 151/61  (!) 154/116  Pulse: 64 (!) 59  (!) 50  Resp: '20 16  16  '$ Temp: 98.4 F (36.9 C) 98.5 F (36.9 C)  97.8 F (36.6 C)  TempSrc: Oral Oral  Oral  SpO2: 100% 99%  99%  Weight:   115.2 kg   Height:        Intake/Output Summary (Last 24 hours) at 02/14/2021 1150 Last data filed at 02/14/2021 0436 Gross per 24 hour  Intake 835.38 ml  Output 800 ml  Net 35.38 ml   Filed Weights   02/13/21 1133 02/14/21 0424  Weight: 113.4 kg 115.2 kg    Examination:  General exam: Appears calm and comfortable  Respiratory system: Clear to  auscultation. Respiratory effort normal. Cardiovascular system: S1 & S2 heard, RRR. No JVD, murmurs, rubs, gallops or clicks. No pedal edema.  Pacemaker present. Gastrointestinal system: Abdomen is nondistended, soft and nontender. No organomegaly or masses felt. Normal bowel sounds heard. Central nervous system: Alert and oriented. No focal neurological deficits. Extremities: Right foot is warm and edematous up to the ankles.  No fluctuation. There is a chronic appearing, tender and none draining wound on the medial and posterior aspect of the right great toe. Patient does have feeble but palpable pulses.     Data Reviewed: I have personally reviewed following labs  and imaging studies  CBC: Recent Labs  Lab 02/13/21 1130 02/14/21 0402  WBC 3.2* 2.9*  NEUTROABS 2.2 1.7  HGB 8.7* 8.4*  HCT 26.5* 26.1*  MCV 94.6 94.9  PLT 100* [id number]*   Basic Metabolic Panel: Recent Labs  Lab 02/13/21 1130 02/13/21 1136 02/14/21 0402  NA 138  --  140  K 3.7  --  3.8  CL 113*  --  115*  CO2 18*  --  20*  GLUCOSE 125*  --  187*  BUN 53*  --  40*  CREATININE 1.43*  --  1.28*  CALCIUM 8.2*  --  8.1*  MG  --  2.0 2.1   GFR: Estimated Creatinine Clearance: 62.3 mL/min (A) (by C-G formula based on SCr of 1.28 mg/dL (H)). Liver Function Tests: Recent Labs  Lab 02/13/21 1130 02/14/21 0402  AST 41 27  ALT 36 28  ALKPHOS 110 83  BILITOT 0.6 0.4  PROT 6.8 6.0*  ALBUMIN 3.5 3.0*   No results for input(s): LIPASE, AMYLASE in the last 168 hours. No results for input(s): AMMONIA in the last 168 hours. Coagulation Profile: Recent Labs  Lab 02/14/21 0402  INR 1.3*   Cardiac Enzymes: No results for input(s): CKTOTAL, CKMB, CKMBINDEX, TROPONINI in the last 168 hours. BNP (last 3 results) No results for input(s): PROBNP in the last 8760 hours. HbA1C: No results for input(s): HGBA1C in the last 72 hours. CBG: Recent Labs  Lab 02/13/21 1659 02/13/21 2126 02/14/21 0753 02/14/21 1128  GLUCAP 109* 184* 141* 209*   Lipid Profile: No results for input(s): CHOL, HDL, LDLCALC, TRIG, CHOLHDL, LDLDIRECT in the last 72 hours. Thyroid Function Tests: No results for input(s): TSH, T4TOTAL, FREET4, T3FREE, THYROIDAB in the last 72 hours. Anemia Panel: Recent Labs    02/13/21 1136  FERRITIN 211  TIBC 258  IRON 31*   Sepsis Labs: Recent Labs  Lab 02/13/21 1130 02/14/21 0402  LATICACIDVEN 1.1 1.1    Recent Results (from the past 240 hour(s))  Resp Panel by RT-PCR (Flu A&B, Covid) Nasopharyngeal Swab     Status: None   Collection Time: 02/13/21  4:03 PM   Specimen: Nasopharyngeal Swab; Nasopharyngeal(NP) swabs in vial transport medium  Result Value  Ref Range Status   SARS Coronavirus 2 by RT PCR NEGATIVE NEGATIVE Final    Comment: (NOTE) SARS-CoV-2 target nucleic acids are NOT DETECTED.  The SARS-CoV-2 RNA is generally detectable in upper respiratory specimens during the acute phase of infection. The lowest concentration of SARS-CoV-2 viral copies this assay can detect is 138 copies/mL. A negative result does not preclude SARS-Cov-2 infection and should not be used as the sole basis for treatment or other patient management decisions. A negative result may occur with  improper specimen collection/handling, submission of specimen other than nasopharyngeal swab, presence of viral mutation(s) within the areas targeted by this assay, and inadequate  number of viral copies(<138 copies/mL). A negative result must be combined with clinical observations, patient history, and epidemiological information. The expected result is Negative.  Fact Sheet for Patients:  EntrepreneurPulse.com.au  Fact Sheet for Healthcare Providers:  IncredibleEmployment.be  This test is no t yet approved or cleared by the [place] FDA and  has been authorized for detection and/or diagnosis of SARS-CoV-2 by FDA under an Emergency Use Authorization (EUA). This EUA will remain  in effect (meaning this test can be used) for the duration of the COVID-19 declaration under Section 564(b)(1) of the Act, 21 U.S.C.section 360bbb-3(b)(1), unless the authorization is terminated  or revoked sooner.       Influenza A by PCR NEGATIVE NEGATIVE Final   Influenza B by PCR NEGATIVE NEGATIVE Final    Comment: (NOTE) The Xpert Xpress SARS-CoV-2/FLU/RSV plus assay is intended as an aid in the diagnosis of influenza from Nasopharyngeal swab specimens and should not be used as a sole basis for treatment. Nasal washings and aspirates are unacceptable for Xpert Xpress SARS-CoV-2/FLU/RSV testing.  Fact Sheet for  Patients: EntrepreneurPulse.com.au  Fact Sheet for Healthcare Providers: IncredibleEmployment.be  This test is not yet approved or cleared by the [place] FDA and has been authorized for detection and/or diagnosis of SARS-CoV-2 by FDA under an Emergency Use Authorization (EUA). This EUA will remain in effect (meaning this test can be used) for the duration of the COVID-19 declaration under Section 564(b)(1) of the Act, 21 U.S.C. section 360bbb-3(b)(1), unless the authorization is terminated or revoked.  Performed at [facility], [address] [place], [place] [address]   Culture, blood (Routine X 2) w Reflex to ID Panel     Status: None (Preliminary result)   Collection Time: 02/13/21  7:01 PM   Specimen: BLOOD  Result Value Ref Range Status   Specimen Description BLOOD RIGHT ANTECUBITAL  Final   Special Requests   Final    BOTTLES DRAWN AEROBIC AND ANAEROBIC Blood Culture adequate volume   Culture   Final    NO GROWTH < 12 HOURS Performed at [facility], [address], [place], [place] [address]    Report Status PENDING  Incomplete  Culture, blood (Routine X 2) w Reflex to ID Panel     Status: None (Preliminary result)   Collection Time: 02/13/21  7:10 PM   Specimen: BLOOD  Result Value Ref Range Status   Specimen Description BLOOD BLOOD LEFT ARM  Final   Special Requests   Final    BOTTLES DRAWN AEROBIC AND ANAEROBIC Blood Culture adequate volume   Culture   Final    NO GROWTH < 12 HOURS Performed at [facility], [address] [place], [place] [address]    Report Status PENDING  Incomplete         Radiology Studies: DG Foot Complete Right  Result Date: 02/13/2021 CLINICAL DATA:  Right great toe ulcer. EXAM: RIGHT FOOT COMPLETE - 3+ VIEW COMPARISON:  No prior. FINDINGS: Soft tissue swelling. Ulceration of the right great toe noted. No radiopaque foreign body. No adjacent bony erosion. If  osteomyelitis remains a clinical concern MRI can be obtained. Diffuse degenerative change. No fracture or dislocation. Peripheral vascular calcification. IMPRESSION: 1. Ulceration noted about the right great toe. No radiopaque foreign body. No adjacent bony erosion. If osteomyelitis remains a clinical concern MRI can be obtained. 2.  Diffuse degenerative change.  No acute bony abnormality. 3.  Peripheral vascular disease. Electronically Signed   By: Marcello Moores  Register   On: 02/13/2021 14:59        Scheduled Meds: . carvedilol  6.25 mg Oral BID  . insulin aspart  0-9 Units Subcutaneous TID WC  . loratadine  10 mg Oral Daily  . metroNIDAZOLE  500 mg Oral Q8H  . pantoprazole  40 mg Oral Daily  . rosuvastatin  40 mg Oral QHS  . sertraline  200 mg Oral Daily  . tamsulosin  0.4 mg Oral Daily   Continuous Infusions: . sodium chloride Stopped (02/13/21 2340)  . cefTRIAXone (ROCEPHIN)  IV Stopped (02/14/21 0601)  . vancomycin       LOS: 1 day    Time spent: 30 minutes    Barb Merino, MD [facility] Pager [phone]

## 2021-02-14 NOTE — TOC Initial Note (Addendum)
Transition of Care [facility]) - Initial/Assessment Note    Patient Details  Name: Nathan Singleton MRN: [id number] Date of Birth: [birth year]  [facility] CM/SW Contact:    Magnus Ivan, LCSW Phone Number: 02/14/2021, 10:49 AM  Clinical Narrative:          CSW met with patient at bedside. Patient reported he lives with significant other Nathan Singleton. PCP and Pharmacy are [facility]. Patient drives himself to appointments. Patient reported he would like a RW and 3 in 1. He says he has a RW that is old, is not sure if it is 79 years old or not. CSW asked [employer] Representative Mardene Celeste to check if patient qualifies for a new RW through insurance. Attempted to call Nathan Singleton afterwards, unable to reach [place].  1:05- Per [employer] Representative, patient would have to private pay for a RW. Patient's insurance will cover 3 in 1. Asked MD for orders and [employer] will discuss private pay option with patient.  Expected Discharge Plan: Home/Self Care Barriers to Discharge: Continued Medical Work up   Patient Goals and CMS Choice Patient states their goals for this hospitalization and ongoing recovery are:: to return home CMS Medicare.gov Compare Post Acute Care list provided to:: Patient Choice offered to / list presented to : Patient  Expected Discharge Plan and Services Expected Discharge Plan: Home/Self Care       Living arrangements for the past 2 months: Single Family Home                 DME Arranged: Gilford Rile rolling,3-N-1 DME Agency: [employer] Date DME Agency Contacted: 02/14/21   Representative spoke with at DME Agency: Nash Shearer            Prior Living Arrangements/Services Living arrangements for the past 2 months: [place] Lives with:: Significant Other Patient language and need for interpreter reviewed:: Yes Do you feel safe going back to the place where you live?: Yes      Need for Family Participation in Patient Care: Yes (Comment) Care giver support  system in place?: Yes (comment) Current home services: DME Criminal Activity/Legal Involvement Pertinent to Current Situation/Hospitalization: No - Comment as needed  Activities of Daily Living Home Assistive Devices/Equipment: Cane (specify quad or straight),Walker (specify type) ADL Screening (condition at time of admission) Patient's cognitive ability adequate to safely complete daily activities?: Yes Is the patient deaf or have difficulty hearing?: Yes Does the patient have difficulty seeing, even when wearing glasses/contacts?: No Does the patient have difficulty concentrating, remembering, or making decisions?: No Patient able to express need for assistance with ADLs?: Yes Does the patient have difficulty dressing or bathing?: No Independently performs ADLs?: Yes (appropriate for developmental age) Does the patient have difficulty walking or climbing stairs?: Yes Weakness of Legs: Both Weakness of Arms/Hands: None  Permission Sought/Granted Permission sought to share information with : Chartered certified accountant granted to share information with : Yes, Verbal Permission Granted     Permission granted to share info w AGENCY: DME agencies        Emotional Assessment         Alcohol / Substance Use: Not Applicable Psych Involvement: No (comment)  Admission diagnosis:  Diabetic foot ulcer ([place]) [Y77.412, L97.509] Cellulitis, unspecified cellulitis site [L03.90] Patient Active Problem List   Diagnosis Date Noted  . BPH (benign prostatic hyperplasia) 02/14/2021  . Diabetic foot ulcer ([place]) 02/13/2021  . Anemia 11/26/2020  . Chronic kidney disease (CKD) 11/26/2020  . Claudication ([place])  11/26/2020  . Contusion of right thigh 11/26/2020  . COPD (chronic obstructive pulmonary disease) ([place]) 11/26/2020  . Depression 11/26/2020  . Dyspnea 11/26/2020  . Erectile dysfunction 11/26/2020  . GERD without esophagitis 11/26/2020  . Gout 11/26/2020  .  Hyperlipidemia, acquired 11/26/2020  . Hyperlipidemia 11/26/2020  . Myocardial infarction ([place]) 11/26/2020  . Personal history of other diseases of urinary system 11/26/2020  . Right leg swelling 11/26/2020  . Sleep apnea 11/26/2020  . Varicose veins 11/26/2020  . Venous insufficiency 11/26/2020  . Vitamin D deficiency 11/26/2020  . Chest pain 11/21/2020  . Thrombocytopenia ([place]) 11/21/2020  . S/P TAVR (transcatheter aortic valve replacement)   . PVD (peripheral vascular disease) ([place])   . Presence of permanent cardiac pacemaker   . Hypertension associated with diabetes ([place])   . Hyperlipidemia associated with type 2 diabetes mellitus ([place])   . History of GI bleed   . CAD (coronary artery disease)   . 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 Pacemaker St Jude device 11/19/2020  . Peripheral vascular disease, unspecified ([place]) stents to popliteal arteries many years ago 11/19/2020  . History of gingival bleeding 11/19/2020  . Coronary artery disease involving coronary bypass graft of native heart with angina pectoris ([place]) 11/19/2020  . 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 Hypertension   . DM2 (diabetes mellitus, type 2) ([place])   . Arthritis   . Closed nondisplaced fracture of greater trochanter of right femur ([place]) 01/25/2020  . DDD (degenerative disc disease), lumbar 12/10/2019  . Chronic low back pain 12/09/2019  . 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 Urinary retention 12/09/2019  . Hearing loss 05/11/2019  . Obesity 04/07/2019  . 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 Bipolar disorder ([place]) 01/01/2019  . 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 Heart block 04/25/2018  . Calculus of gallbladder with acute cholecystitis without obstruction 09/21/2017  . Ulcer of jejunum 03/23/2014  . Insomnia 03/22/2014  . Blisters of  multiple sites 01/18/2014  . Cellulitis 01/18/2014  . OA (osteoarthritis) of knee 06/09/2013  . Status post percutaneous transluminal coronary angioplasty 01/14/2011   PCP:  Ranae Plumber, PA Pharmacy:   [id number] [place] [place] [id number]: [phone]: [address]Phone: [phone]: [phone]    Social Determinants of Health (SDOH) Interventions    Readmission Risk Interventions Readmission Risk Prevention Plan 02/14/2021  Transportation Screening Complete  Medication Review (RN Care Manager) Complete  PCP or Specialist appointment within 3-5 days of discharge Complete  HRI or Home Care Consult Complete  SW Recovery Care/Counseling Consult Complete  Palliative Care Screening Complete  Skilled Nursing Facility Complete

## 2021-02-14 NOTE — Consult Note (Signed)
ORTHOPAEDIC CONSULTATION  REQUESTING PHYSICIAN: Barb Merino, MD  Chief Complaint: Right great toe ulcer  HPI: Nathan Singleton is a 80 y.o. male who complains of worsening swelling and drainage from his right great toe.  Seen by nephrology earlier and sent to the hospital for worsening ulceration to this right great toe.  Has been seen in the outpatient clinic by podiatry.  Longstanding history of diabetes.  He has had this ulcer evaluated and debrided in the past.  Past Medical History:  Diagnosis Date  . Aneurysm (arteriovenous) of coronary vessels   . Arthritis   . Ascending aortic aneurysm ([place])   . CAD (coronary artery disease)   . Diabetes mellitus without complication ([place])   . History of GI bleed   . Hyperlipidemia associated with type 2 diabetes mellitus ([place])   . Hypertension associated with diabetes ([place])   . Presence of permanent cardiac pacemaker   . PVD (peripheral vascular disease) ([place])   . S/P TAVR (transcatheter aortic valve replacement)    Past Surgical History:  Procedure Laterality Date  . Ankle repaired Right   . Carpal Tunnel repaired Left   . CATARACT EXTRACTION Bilateral   . COLONOSCOPY    . Copillar implant     . DG CHOLECYSTOGRAPHY GALL BLADDER ([place] [facility])    . GASTRIC BYPASS    . heart valve replaced    . PACEMAKER GENERATOR CHANGE    . REPLACEMENT TOTAL KNEE BILATERAL    . RTC    . STENT PLACE LEFT URETER ([facility] HX) Right    Right Leg  . Toe nail removed Bilateral    Social History   Socioeconomic History  . Marital status: Single    Spouse name: Not on file  . Number of children: Not on file  . Years of education: Not on file  . Highest education level: Not on file  Occupational History  . Not on file  Tobacco Use  . Smoking status: Former Research scientist (life sciences)  . Smokeless tobacco: Never Used  Substance and Sexual Activity  . Alcohol use: Not Currently  . Drug use: Not Currently  . Sexual activity: Not on file  Other Topics Concern  . Not on  file  Social History Narrative  . Not on file   Social Determinants of Health   Financial Resource Strain: Not on file  Food Insecurity: Not on file  Transportation Needs: Not on file  Physical Activity: Not on file  Stress: Not on file  Social Connections: Not on file   Family History  Problem Relation Age of Onset  . Heart disease Mother   . Alzheimer's disease Mother   . Heart disease Father   . Diabetes Father    Allergies  Allergen Reactions  . Atorvastatin Other (See Comments)    "bad for kidneys" Cramping   . Celecoxib Other (See Comments)    Other reaction(s): Other (See Comments) Kidney Problem Kidney Problem   . Dicyclomine Hcl Other (See Comments)    Stomach cramps  . Doxycycline Other (See Comments)  . Metformin Other (See Comments)   Prior to Admission medications   Medication Sig Start Date End Date Taking? Authorizing Provider  calcium citrate-vitamin D (CITRACAL+D) 315-200 MG-UNIT tablet Take 1 tablet by mouth daily. 11/11/20  Yes [provider]  D-5000 125 MCG (5000 UT) TABS Take 1 tablet by mouth daily. 11/11/20  Yes [provider]  insulin glargine (LANTUS) 100 UNIT/ML Solostar Pen Inject 25 Units into the skin at bedtime.  Yes [provider]  liraglutide (VICTOZA) 18 MG/3ML SOPN Inject 1.8 mg into the skin daily.   Yes [provider]  multivitamin (ONE-A-DAY MEN'S) TABS tablet Take 1 tablet by mouth daily. 11/11/20  Yes [provider]  allopurinol (ZYLOPRIM) 100 MG tablet Take 100 mg by mouth daily. 10/19/20   [provider]  amLODipine (NORVASC) 10 MG tablet Take 10 mg by mouth daily.    [provider]  baclofen (LIORESAL) 10 MG tablet Take 10 mg by mouth 2 (two) times daily. 02/12/21   [provider]  BAYER ASPIRIN EC LOW DOSE 81 MG EC tablet Take 81 mg by mouth daily. 11/11/20   [provider]  carvedilol (COREG) 6.25 MG tablet Take 6.25 mg by mouth 2 (two)  times daily. 11/11/20   [provider]  cetirizine (ZYRTEC) 10 MG tablet Take 10 mg by mouth daily. 11/11/20   [provider]  diphenoxylate-atropine (LOMOTIL) 2.5-0.025 MG tablet Take 1 tablet by mouth every 6 (six) hours as needed for diarrhea or loose stools. 01/01/21   [provider]  docusate sodium (COLACE) 100 MG capsule Take 200 mg by mouth 2 (two) times daily. 11/11/20   [provider]  furosemide (LASIX) 20 MG tablet Take 20 mg by mouth daily. 10/19/20   [provider]  gabapentin (NEURONTIN) 100 MG capsule Take 100 mg by mouth daily. 01/29/21   [provider]  HYDROcodone-acetaminophen (NORCO) 7.5-325 MG tablet Take 1-2 tablets by mouth every 6 (six) hours as needed for moderate pain or severe pain. 11/22/20   Swayze, Ava, DO  isosorbide mononitrate (IMDUR) 30 MG 24 hr tablet Take 30 mg by mouth daily. 02/12/21   [provider]  LINZESS 145 MCG CAPS capsule Take 145 mcg by mouth daily. 11/11/20   [provider]  losartan (COZAAR) 25 MG tablet Take 25 mg by mouth daily. 02/13/21   [provider]  pantoprazole (PROTONIX) 40 MG tablet Take 1 tablet (40 mg total) by mouth daily. 02/11/21   [name], MD  rosuvastatin (CRESTOR) 40 MG tablet Take 40 mg by mouth daily. 02/13/21   [provider]  sertraline (ZOLOFT) 100 MG tablet Take 200 mg by mouth daily. 11/11/20   [provider]  tamsulosin (FLOMAX) 0.4 MG CAPS capsule Take 0.4 mg by mouth daily. 11/11/20   [provider]   DG Foot Complete Right  Result Date: 02/13/2021 CLINICAL DATA:  Right great toe ulcer. EXAM: RIGHT FOOT COMPLETE - 3+ VIEW COMPARISON:  No prior. FINDINGS: Soft tissue swelling. Ulceration of the right great toe noted. No radiopaque foreign body. No adjacent bony erosion. If osteomyelitis remains a clinical concern MRI can be obtained. Diffuse degenerative change. No fracture or dislocation. Peripheral vascular  calcification. IMPRESSION: 1. Ulceration noted about the right great toe. No radiopaque foreign body. No adjacent bony erosion. If osteomyelitis remains a clinical concern MRI can be obtained. 2.  Diffuse degenerative change.  No acute bony abnormality. 3.  Peripheral vascular disease. Electronically Signed   By: Marcello Moores  Register   On: 02/13/2021 14:59    Positive ROS: All other systems have been reviewed and were otherwise negative with the exception of those mentioned in the HPI and as above.  12 point ROS was performed.  Physical Exam: General: Alert and oriented.  No apparent distress.  Vascular:  Left foot:Dorsalis Pedis:  absent Posterior Tibial:  absent  Right foot: Dorsalis Pedis:  absent Posterior Tibial:  absent  Neuro:absent  protective sensation although he is complaining of some pain with palpation to the right great toe  Derm: Ulceration on the medial aspect of the right great toe with some surrounding hyperkeratosis.  No lymphangitic streaking.  Ortho/MS: Right great toe is edematous compared to the left side.  I reviewed x-rays which are negative for erosive changes.  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  X-rays were negative for now.  Ideally I would order an MRI but patient has a pacemaker per medical history.  At this time I will order a bone scan to further evaluate this right great toe ulcerative site.  Also patient does not have palpable pulses and recommend vascular evaluation.  Patient has been seen in the outpatient clinic noninvasive studies have been performed.  Would recommend vascular opinion prior to surgical debridement to this region.  For now we will continue with local wound care per the wound care nurse.  We will follow-up after bone scan has been performed.    Elesa Hacker, DPM Cell [phone]   02/14/2021 10:47 AM

## 2021-02-14 NOTE — Consult Note (Signed)
Pharmacy Antibiotic Note  Nathan Singleton is a 80 y.o. male admitted on 02/13/2021 with chronic right great toe ulcer. Patient with PMH significant for insulin-dependent diabetes mellitus (A1c 8.7 02/2021), peripheral vascular disease, pacemaker placement, aortic stenosis s/p TAVR. Patient recently completed oral course of antibiotics (cephalexin 2/10 - 2/20) and noticed increase in swelling and redness over last few days.   Pharmacy has been consulted for Vancomycin dosing for cellulitis-diabetic foot infection Foot Xray not c/f osteomyelitis   Patient is also ordered ceftriaxone 2 gram Q24 and Metronidazole PO 500 mg Q8H  Plan:  Will give additional 1500 mg IV vancomycin for total vancomycin loading dose of 2500 mg   Increase maintenance regimen to 1500 mg Q24H for goal AUC 400-550  Anticipated AUC: 472  Scr used: 1.28  Continue to monitor patient follow renal function daily for possible dose adjustments  Podiatry plans for bone scan to further assess and wants vascular assessment regarding possibility of debridement   Height: 6' (182.9 cm) Weight: 115.2 kg (253 lb 15.5 oz) IBW/kg (Calculated) : 77.6  Temp (24hrs), Avg:98.3 F (36.8 C), Min:97.8 F (36.6 C), Max:98.6 F (37 C)  Recent Labs  Lab 02/13/21 1130 02/14/21 0402  WBC 3.2* 2.9*  CREATININE 1.43* 1.28*  LATICACIDVEN 1.1 1.1    Estimated Creatinine Clearance: 62.3 mL/min (A) (by C-G formula based on SCr of 1.28 mg/dL (H)).    Allergies  Allergen Reactions   Atorvastatin Other (See Comments)    "bad for kidneys" Cramping    Celecoxib Other (See Comments)    Other reaction(s): Other (See Comments) Kidney Problem Kidney Problem    Dicyclomine Hcl Other (See Comments)    Stomach cramps   Doxycycline Other (See Comments)   Metformin Other (See Comments)    Antimicrobials this admission: 3/3 Vancomycin >>  3/3 Ceftriaxone >> 3/3 Metronidazole >>   Dose adjustments this admission: Increased 3/4  from Vancomycin '1250mg'$  IV q24 > '1500mg'$  IV q24  Microbiology results: 3/3 BCx: sent  Thank you for allowing pharmacy to be a part of this patients care.  Lu Duffel, PharmD, BCPS Clinical Pharmacist  02/14/2021 1:20 PM

## 2021-02-14 NOTE — Progress Notes (Signed)
[place], [place] 02/14/21  Subjective:   LOS: 1  Nathan Singleton is a 80 year old Caucasian male with past medical history of type 2 diabetes, severe aortic stenosis status post TAVR, pacemaker, hypertension, and chronic anemia.  He presents to the emergency room with complaints of progressive right foot pain.  He is admitted for an infected right diabetic foot ulcer.  According to the patient this foot ulcer developed 3 weeks ago and has seen his outpatient podiatrist, Dr. Luana Shu, for management and a 10-day course of oral antibiotics.  These antibiotics were completed 7 days ago.  Patient states there was improvement in erythema, edema, and temperature of the toe.  He also reports the appearance of drainage.  He is a current patient of [facility].  We monitor his chronic kidney disease stage IIIa.  He was last seen in the office on March 2.  Patient is seen today resting in bed. Alert and oriented, able to answer questions. Able to tolerate meals without nausea. Denies shortness of breath Only area of discomfort is right great toe.  UOP 800 ml  Objective:  Vital signs in last 24 hours:  Temp:  [97.8 F (36.6 C)-98.6 F (37 C)] 98.6 F (37 C) (03/04 1225) Pulse Rate:  [50-73] 73 (03/04 1225) Resp:  [16-20] 16 (03/04 1225) BP: (125-154)/(47-116) 137/60 (03/04 1225) SpO2:  [97 %-100 %] 98 % (03/04 1225) Weight:  [115.2 kg] 115.2 kg (03/04 0424)  Weight change:  Filed Weights   02/13/21 1133 02/14/21 0424  Weight: 113.4 kg 115.2 kg    Intake/Output:    Intake/Output Summary (Last 24 hours) at 02/14/2021 1448 Last data filed at 02/14/2021 0436 Gross per 24 hour  Intake 835.38 ml  Output 800 ml  Net 35.38 ml     Physical Exam: General: No acute distress  HEENT Normocephalic, moist oral mucosa  Pulm/lungs Clear, normal breathing pattern  CVS/Heart Regular rate  Abdomen:  Soft, tender  Extremities: Minimal peripheral  edema  Neurologic: Alert and oriented  Skin: Right great toe ulcer          Basic Metabolic Panel:  Recent Labs  Lab 02/13/21 1130 02/13/21 1136 02/14/21 0402  NA 138  --  140  K 3.7  --  3.8  CL 113*  --  115*  CO2 18*  --  20*  GLUCOSE 125*  --  187*  BUN 53*  --  40*  CREATININE 1.43*  --  1.28*  CALCIUM 8.2*  --  8.1*  MG  --  2.0 2.1     CBC: Recent Labs  Lab 02/13/21 1130 02/14/21 0402  WBC 3.2* 2.9*  NEUTROABS 2.2 1.7  HGB 8.7* 8.4*  HCT 26.5* 26.1*  MCV 94.6 94.9  PLT 100* 102*     No results found for: HEPBSAG, HEPBSAB, HEPBIGM    Microbiology:  Recent Results (from the past 240 hour(s))  Resp Panel by RT-PCR (Flu A&B, Covid) Nasopharyngeal Swab     Status: None   Collection Time: 02/13/21  4:03 PM   Specimen: Nasopharyngeal Swab; Nasopharyngeal(NP) swabs in vial transport medium  Result Value Ref Range Status   SARS Coronavirus 2 by RT PCR NEGATIVE NEGATIVE Final    Comment: (NOTE) SARS-CoV-2 target nucleic acids are NOT DETECTED.  The SARS-CoV-2 RNA is generally detectable in upper respiratory specimens during the acute phase of infection. The lowest concentration of SARS-CoV-2 viral copies this assay can detect is 138 copies/mL. A negative result does  not preclude SARS-Cov-2 infection and should not be used as the sole basis for treatment or other patient management decisions. A negative result may occur with  improper specimen collection/handling, submission of specimen other than nasopharyngeal swab, presence of viral mutation(s) within the areas targeted by this assay, and inadequate number of viral copies(<138 copies/mL). A negative result must be combined with clinical observations, patient history, and epidemiological information. The expected result is Negative.  Fact Sheet for Patients:  EntrepreneurPulse.com.au  Fact Sheet for Healthcare Providers:  IncredibleEmployment.be  This test is no  t yet approved or cleared by the [place] FDA and  has been authorized for detection and/or diagnosis of SARS-CoV-2 by FDA under an Emergency Use Authorization (EUA). This EUA will remain  in effect (meaning this test can be used) for the duration of the COVID-19 declaration under Section 564(b)(1) of the Act, 21 U.S.C.section 360bbb-3(b)(1), unless the authorization is terminated  or revoked sooner.       Influenza A by PCR NEGATIVE NEGATIVE Final   Influenza B by PCR NEGATIVE NEGATIVE Final    Comment: (NOTE) The Xpert Xpress SARS-CoV-2/FLU/RSV plus assay is intended as an aid in the diagnosis of influenza from Nasopharyngeal swab specimens and should not be used as a sole basis for treatment. Nasal washings and aspirates are unacceptable for Xpert Xpress SARS-CoV-2/FLU/RSV testing.  Fact Sheet for Patients: EntrepreneurPulse.com.au  Fact Sheet for Healthcare Providers: IncredibleEmployment.be  This test is not yet approved or cleared by the [place] FDA and has been authorized for detection and/or diagnosis of SARS-CoV-2 by FDA under an Emergency Use Authorization (EUA). This EUA will remain in effect (meaning this test can be used) for the duration of the COVID-19 declaration under Section 564(b)(1) of the Act, 21 U.S.C. section 360bbb-3(b)(1), unless the authorization is terminated or revoked.  Performed at [facility], [address] [place], [place] [address]   Culture, blood (Routine X 2) w Reflex to ID Panel     Status: None (Preliminary result)   Collection Time: 02/13/21  7:01 PM   Specimen: BLOOD  Result Value Ref Range Status   Specimen Description BLOOD RIGHT ANTECUBITAL  Final   Special Requests   Final    BOTTLES DRAWN AEROBIC AND ANAEROBIC Blood Culture adequate volume   Culture   Final    NO GROWTH < 12 HOURS Performed at [facility], [address] [place], [place] [address]     Report Status PENDING  Incomplete  Culture, blood (Routine X 2) w Reflex to ID Panel     Status: None (Preliminary result)   Collection Time: 02/13/21  7:10 PM   Specimen: BLOOD  Result Value Ref Range Status   Specimen Description BLOOD BLOOD LEFT ARM  Final   Special Requests   Final    BOTTLES DRAWN AEROBIC AND ANAEROBIC Blood Culture adequate volume   Culture   Final    NO GROWTH < 12 HOURS Performed at [facility], [address], [place], [place] [address]    Report Status PENDING  Incomplete    Coagulation Studies: Recent Labs    02/14/21 0402  LABPROT 15.7*  INR 1.3*    Urinalysis: Recent Labs    02/13/21 1507  COLORURINE STRAW*  LABSPEC 1.011  PHURINE 5.0  GLUCOSEU NEGATIVE  HGBUR NEGATIVE  BILIRUBINUR NEGATIVE  KETONESUR NEGATIVE  PROTEINUR NEGATIVE  NITRITE NEGATIVE  LEUKOCYTESUR NEGATIVE      Imaging: DG Foot Complete Right  Result Date: 02/13/2021 CLINICAL  DATA:  Right great toe ulcer. EXAM: RIGHT FOOT COMPLETE - 3+ VIEW COMPARISON:  No prior. FINDINGS: Soft tissue swelling. Ulceration of the right great toe noted. No radiopaque foreign body. No adjacent bony erosion. If osteomyelitis remains a clinical concern MRI can be obtained. Diffuse degenerative change. No fracture or dislocation. Peripheral vascular calcification. IMPRESSION: 1. Ulceration noted about the right great toe. No radiopaque foreign body. No adjacent bony erosion. If osteomyelitis remains a clinical concern MRI can be obtained. 2.  Diffuse degenerative change.  No acute bony abnormality. 3.  Peripheral vascular disease. Electronically Signed   By: Marcello Moores  Register   On: 02/13/2021 14:59     Medications:   . sodium chloride Stopped (02/13/21 2340)  . cefTRIAXone (ROCEPHIN)  IV Stopped (02/14/21 0601)  . vancomycin     . carvedilol  6.25 mg Oral BID  . insulin aspart  0-9 Units Subcutaneous TID WC  . insulin detemir  10 Units Subcutaneous BID  . loratadine  10 mg Oral  Daily  . metroNIDAZOLE  500 mg Oral Q8H  . [START ON 02/15/2021] multivitamin with minerals  1 tablet Oral Daily  . pantoprazole  40 mg Oral Daily  . Ensure Max Protein  11 oz Oral Daily  . rosuvastatin  40 mg Oral QHS  . sertraline  200 mg Oral Daily  . tamsulosin  0.4 mg Oral Daily   sodium chloride, acetaminophen **OR** acetaminophen, fentaNYL (SUBLIMAZE) injection, oxyCODONE  Assessment/ Plan:  80 y.o. male with  was admitted on 02/13/2021 for  Principal Problem:   Diabetic foot ulcer ([place]) Active Problems:   Hypertension   DM2 (diabetes mellitus, type 2) ([place])   Hyperlipidemia associated with type 2 diabetes mellitus ([place])   Anemia   BPH (benign prostatic hyperplasia)  Diabetic foot ulcer ([place]) [id number], L97.509] Cellulitis, unspecified cellulitis site [L03.90]  #.  Acute kidney injury on chronic kidney disease stage IIIa with baseline creatinine 1.07 on 11/19/20.  Creatinine on admission 1.43 Current level 1.28 Will continue to monitor.  No need for dialysis at this time.    #. Anemia of CKD  Lab Results  Component Value Date   HGB 8.4 (L) 02/14/2021  Will monitor  #. Infected right great toe diabetic foot ulcer antibiotics ordered Podiatry consulted Negative for osteomylitis   #. Diabetes type 2 with CKD Hgb A1c MFr Bld (%)  Date Value  11/22/2020 7.5 (H)  Glucose controlled during this admission SSi ordered by primary team   LOS: 1   3/4/20222:48 PM  [facility] [place], [place]

## 2021-02-14 NOTE — Progress Notes (Signed)
Initial Nutrition Assessment  DOCUMENTATION CODES:   Obesity unspecified  INTERVENTION:  Provide Ensure Max Protein po once daily, each supplement provides 150 kcal and 30 grams of protein.  Provide MVI po daily.  NUTRITION DIAGNOSIS:   Increased nutrient needs related to wound healing as evidenced by estimated needs.  GOAL:   Patient will meet greater than or equal to 90% of their needs  MONITOR:   PO intake,Supplement acceptance,Labs,Weight trends,I & O's,Skin  REASON FOR ASSESSMENT:   Malnutrition Screening Tool    ASSESSMENT:   80 year old male with PMHx of DM, arthritis, severe aortic stenosis s/p TAVR, pacemaker, HTN, chronic anemia admitted with infected right diabetic foot ulcer.   Met with patient at bedside. He is very hard of hearing. Difficult for him to understand questions RD asked. He reports his appetite has been decreased for a while now since his foot became infected. He is still eating 3 meals per day but is eating smaller portions at meals. He is unable to provide further details on intake. Per chart he ate 100% of his dinner last night. Discussed increased nutrient needs for wound healing. Patient is amenable to trying oral nutrition supplements to help meet increased needs.  He reports his UBW is 251 lbs and he is weight-stable. Patient currently documented to be 115.2 kg (253.97 lbs).   Medications reviewed and include: Novolog 0-9 units TID, Flagyl, Protonix, ceftriaxone, vancomycin.  Labs reviewed: CBG 141-209, Chloride 115, CO2 20, BUN 40, Creatinine 1.28.  NUTRITION - FOCUSED PHYSICAL EXAM:  Flowsheet Row Most Recent Value  Orbital Region No depletion  Upper Arm Region No depletion  Thoracic and Lumbar Region No depletion  Buccal Region No depletion  Temple Region No depletion  Clavicle Bone Region No depletion  Clavicle and Acromion Bone Region No depletion  Scapular Bone Region No depletion  Dorsal Hand Mild depletion  Patellar Region  No depletion  Anterior Thigh Region No depletion  Posterior Calf Region No depletion  Edema (RD Assessment) None  Hair Reviewed  Eyes Reviewed  Mouth Reviewed  Skin Reviewed  Nails Reviewed     Diet Order:   Diet Order            Diet Carb Modified Fluid consistency: Thin; Room service appropriate? Yes  Diet effective now                EDUCATION NEEDS:   No education needs have been identified at this time  Skin:  Skin Assessment: Skin Integrity Issues: Skin Integrity Issues:: Diabetic Ulcer Diabetic Ulcer: right foot  Last BM:  02/13/2021 per chart  Height:   Ht Readings from Last 1 Encounters:  02/13/21 6' (1.829 m)   Weight:   Wt Readings from Last 1 Encounters:  02/14/21 115.2 kg   BMI:  Body mass index is 34.44 kg/m.  Estimated Nutritional Needs:   Kcal:  2300-2500  Protein:  115-125 grams  Fluid:  >/= 2 L/day  Jacklynn Barnacle, MS, RD, LDN Pager number available on Amion

## 2021-02-14 NOTE — Consult Note (Addendum)
[place] Nurse Consult Note: Reason for Consult: Consult requested for right foot cellulitis.  Pt states he has been followed by a podiatrist in the past for debridements but he recently moved from [place].  Wound type: Right great toe with generalized edema; dry hard callous surrounding an area of dry eschar; affected area is approx 3X4cm.  Currently, no open wound or drainage. Dressing procedure/placement/frequency: Progress notes indicate podiatry will be consulted for further plan of care; area could benefit from possible debridement.  Topical treatment orders provided for bedside nurses to protect from further injury as  follows until input from podiatry is available: Apply xeroform gauze to right great toe Q day and cover with gauze and tape. Please re-consult if further assistance is needed.  Thank-you,  Julien Girt MSN, [place], [place], [place], [place]

## 2021-02-14 NOTE — Consult Note (Signed)
[facility] Vascular Consult Note  MRN : [id number]  Nathan Singleton is a 80 y.o. (1941-09-29) male who presents with chief complaint of  Chief Complaint  Patient presents with  . Abnormal Lab  . Wound Infection  .  History of Present Illness:   I am asked to evaluate the patient by Dr. Vickki Muff.  The patient is a 80 year old gentleman well-known to our service who presented to the [facility] regional emergency department with increasing pain and swelling of his right great toe.  He has a known right great toe ulcer which has had several episodes of infection.  Over the past several days he has had increasing pain as well as redness and swelling of the right foot itself.  He was seen by nephrology on the day of admission who referred him to the emergency room with recommendation for IV antibiotics.  Patient has extensive.  History of atherosclerotic occlusive disease and has been treated here to  regional in the past.  Current Facility-Administered Medications  Medication Dose Route Frequency Provider Last Rate Last Admin  . 0.9 %  sodium chloride infusion   Intravenous PRN Howerter, Justin B, DO   Stopped at 02/13/21 2340  . acetaminophen (TYLENOL) tablet 650 mg  650 mg Oral Q6H PRN Howerter, Justin B, DO   650 mg at 02/14/21 1303   Or  . acetaminophen (TYLENOL) suppository 650 mg  650 mg Rectal Q6H PRN Howerter, Justin B, DO      . carvedilol (COREG) tablet 6.25 mg  6.25 mg Oral BID Howerter, Justin B, DO   6.25 mg at 02/14/21 [id number]  . cefTRIAXone (ROCEPHIN) 2 g in sodium chloride 0.9 % 100 mL IVPB  2 g Intravenous Q24H Howerter, Justin B, DO   Stopped at 02/14/21 0601  . fentaNYL (SUBLIMAZE) injection 12.5 mcg  12.5 mcg Intravenous Q2H PRN Howerter, Justin B, DO      . insulin aspart (novoLOG) injection 0-9 Units  0-9 Units Subcutaneous TID WC Howerter, Justin B, DO   2 Units at 02/14/21 1625  . insulin detemir (LEVEMIR) injection 10 Units  10 Units  Subcutaneous BID Barb Merino, MD   10 Units at 02/14/21 1507  . loratadine (CLARITIN) tablet 10 mg  10 mg Oral Daily Howerter, Justin B, DO   10 mg at 02/14/21 [id number]  . metroNIDAZOLE (FLAGYL) tablet 500 mg  500 mg Oral Q8H Howerter, Justin B, DO   500 mg at 02/14/21 1302  . [START ON 02/15/2021] multivitamin with minerals tablet 1 tablet  1 tablet Oral Daily Ghimire, Kuber, MD      . oxyCODONE (Oxy IR/ROXICODONE) immediate release tablet 5 mg  5 mg Oral Q6H PRN Lang Snow, NP   5 mg at 02/14/21 1208  . pantoprazole (PROTONIX) EC tablet 40 mg  40 mg Oral Daily Howerter, Justin B, DO   40 mg at 02/14/21 [id number]  . protein supplement (ENSURE MAX) liquid  11 oz Oral Daily Barb Merino, MD   11 oz at 02/14/21 1312  . rosuvastatin (CRESTOR) tablet 40 mg  40 mg Oral QHS Howerter, Justin B, DO   40 mg at 02/13/21 2156  . sertraline (ZOLOFT) tablet 200 mg  200 mg Oral Daily Howerter, Justin B, DO   200 mg at 02/14/21 [id number]  . tamsulosin (FLOMAX) capsule 0.4 mg  0.4 mg Oral Daily Howerter, Justin B, DO   0.4 mg at 02/14/21 [id number]  . vancomycin (VANCOREADY) IVPB 1500 mg/300 mL  1,500 mg  Intravenous Q24H Lu Duffel, Jauca 150 mL/hr at 02/14/21 1512 1,500 mg at 02/14/21 1512    Past Medical History:  Diagnosis Date  . Aneurysm (arteriovenous) of coronary vessels   . Arthritis   . Ascending aortic aneurysm ([place])   . CAD (coronary artery disease)   . Diabetes mellitus without complication ([place])   . History of GI bleed   . Hyperlipidemia associated with type 2 diabetes mellitus ([place])   . Hypertension associated with diabetes ([place])   . Presence of permanent cardiac pacemaker   . PVD (peripheral vascular disease) ([place])   . S/P TAVR (transcatheter aortic valve replacement)     Past Surgical History:  Procedure Laterality Date  . Ankle repaired Right   . Carpal Tunnel repaired Left   . CATARACT EXTRACTION Bilateral   . COLONOSCOPY    . Copillar implant     . DG CHOLECYSTOGRAPHY GALL  BLADDER ([facility])    . GASTRIC BYPASS    . heart valve replaced    . PACEMAKER GENERATOR CHANGE    . REPLACEMENT TOTAL KNEE BILATERAL    . RTC    . STENT PLACE LEFT URETER ([facility] HX) Right    Right Leg  . Toe nail removed Bilateral     Social History Social History   Tobacco Use  . Smoking status: Former Research scientist (life sciences)  . Smokeless tobacco: Never Used  Substance Use Topics  . Alcohol use: Not Currently  . Drug use: Not Currently    Family History Family History  Problem Relation Age of Onset  . Heart disease Mother   . Alzheimer's disease Mother   . Heart disease Father   . Diabetes Father   No family history of bleeding/clotting disorders, porphyria or autoimmune disease   Allergies  Allergen Reactions  . Atorvastatin Other (See Comments)    "bad for kidneys" Cramping   . Celecoxib Other (See Comments)    Other reaction(s): Other (See Comments) Kidney Problem Kidney Problem   . Dicyclomine Hcl Other (See Comments)    Stomach cramps  . Doxycycline Other (See Comments)  . Metformin Other (See Comments)     REVIEW OF SYSTEMS (Negative unless checked)  Constitutional: '[]'$ Weight loss  '[]'$ Fever  '[]'$ Chills Cardiac: '[]'$ Chest pain   '[]'$ Chest pressure   '[]'$ Palpitations   '[]'$ Shortness of breath when laying flat   '[]'$ Shortness of breath at rest   '[]'$ Shortness of breath with exertion. Vascular:  '[x]'$ Pain in legs with walking   '[x]'$ Pain in legs at rest  '[]'$ History of DVT   '[]'$ Phlebitis   '[x]'$ Swelling in legs   '[]'$ Varicose veins   '[x]'$ Non-healing ulcers Pulmonary:   '[]'$ Uses home oxygen   '[]'$ Productive cough   '[]'$ Hemoptysis   '[]'$ Wheeze  '[]'$ COPD   '[]'$ Asthma Neurologic:  '[]'$ Dizziness  '[]'$ Blackouts   '[]'$ Seizures   '[]'$ History of stroke   '[]'$ History of TIA  '[]'$ Aphasia   '[]'$ Temporary blindness   '[]'$ Dysphagia   '[]'$ Weakness or numbness in arms   '[]'$ Weakness or numbness in legs Musculoskeletal:  '[]'$ Arthritis   '[]'$ Joint swelling   '[x]'$ Joint pain   '[]'$ Low back pain Hematologic:  '[]'$ Easy bruising  '[]'$ Easy bleeding    '[]'$ Hypercoagulable state   '[]'$ Anemic  '[]'$ Hepatitis Gastrointestinal:  '[]'$ Blood in stool   '[]'$ Vomiting blood  '[]'$ Gastroesophageal reflux/heartburn   '[]'$ Difficulty swallowing. Genitourinary:  '[x]'$ Chronic kidney disease   '[]'$ Difficult urination  '[]'$ Frequent urination  '[]'$ Burning with urination   '[]'$ Blood in urine Skin:  '[x]'$ Rashes   '[x]'$ Ulcers   '[]'$ Wounds Psychological:  '[]'$ History of anxiety   '[]'$  History of  major depression.    Physical Examination  Vitals:   02/14/21 0424 02/14/21 0833 02/14/21 1225 02/14/21 1558  BP:  (!) 154/116 137/60 (!) 149/63  Pulse:  (!) 50 73 (!) 52  Resp:  '16 16 15  '$ Temp:  97.8 F (36.6 C) 98.6 F (37 C) 97.8 F (36.6 C)  TempSrc:  Oral Oral Oral  SpO2:  99% 98% 98%  Weight: 115.2 kg     Height:       Body mass index is 34.44 kg/m.  Head: [place]/AT, No temporalis wasting. Prominent temp pulse not noted. Ear/Nose/Throat: Nares w/o erythema or drainage, oropharynx w/o obsrtuction,  Eyes: PER, Sclera nonicteric.  Neck: Supple, no nuchal rigidity.  No bruit or JVD.  Pulmonary:  Breath sounds equal bilaterally, no use of accessory muscles.  Cardiac: RRR, normal S1, S2, no Murmurs, rubs or gallops. Vascular: Right great toe ulcer present clearly infected.  Pedal pulses are nonpalpable bilaterally.  Popliteal pulses are nonpalpable bilaterally Gastrointestinal: soft, non-tender, non-distended.  Musculoskeletal: Moves all extremities.  No deformity or atrophy. No edema. Neurologic: CN 2-12 intact. Symmetrical.  Speech is fluent.  Psychiatric: Judgment intact, Mood & affect appropriate for pt's clinical situation. Dermatologic: No rashes or ulcers noted.  + cellulitis with open wounds.     CBC Lab Results  Component Value Date   WBC 2.9 (L) 02/14/2021   HGB 8.4 (L) 02/14/2021   HCT 26.1 (L) 02/14/2021   MCV 94.9 02/14/2021   PLT 102 (L) 02/14/2021    BMET    Component Value Date/Time   NA 140 02/14/2021 0402   NA 137 11/19/2020 1439   K 3.8 02/14/2021 0402   CL  115 (H) 02/14/2021 0402   CO2 20 (L) 02/14/2021 0402   GLUCOSE 187 (H) 02/14/2021 0402   BUN 40 (H) 02/14/2021 0402   BUN 39 (H) 11/19/2020 1439   CREATININE 1.28 (H) 02/14/2021 0402   CALCIUM 8.1 (L) 02/14/2021 0402   GFRNONAA 57 (L) 02/14/2021 0402   GFRAA 76 11/19/2020 1439   Estimated Creatinine Clearance: 62.3 mL/min (A) (by C-G formula based on SCr of 1.28 mg/dL (H)).  COAG Lab Results  Component Value Date   INR 1.3 (H) 02/14/2021     Assessment/Plan: 1.  Atherosclerotic occlusive disease bilateral lower extremities with ulceration of the right great toe:  Recommend:  The patient has evidence of severe atherosclerotic changes of both lower extremities associated with ulceration and tissue loss of the right foot.  This represents a limb threatening ischemia and places the patient at the risk for right limb loss.  Patient should undergo angiography of the right lower extremity with the hope for intervention for limb salvage.  The risks and benefits as well as the alternative therapies was discussed in detail with the patient.  All questions were answered.  Patient agrees to proceed with right leg angiography.  The patient will follow up with me in the office after the procedure.   2.  Diabetic foot infection: Continue antibiotics as ordered.  Local wound care.  Surgical plans per podiatry service.  3.  Diabetes mellitus: Continue hypoglycemic medications as already ordered, these medications have been reviewed and there are no changes at this time.  Hgb A1C to be monitored as already arranged by primary service.  4.  Hypertension: Continue antihypertensive medications as already ordered, these medications have been reviewed and there are no changes at this time.     Nathan Pilar, MD  02/14/2021 5:31 PM

## 2021-02-15 ENCOUNTER — Other Ambulatory Visit: Payer: Self-pay

## 2021-02-15 DIAGNOSIS — E11621 Type 2 diabetes mellitus with foot ulcer: Secondary | ICD-10-CM | POA: Diagnosis not present

## 2021-02-15 DIAGNOSIS — Z794 Long term (current) use of insulin: Secondary | ICD-10-CM | POA: Diagnosis not present

## 2021-02-15 DIAGNOSIS — L97519 Non-pressure chronic ulcer of other part of right foot with unspecified severity: Secondary | ICD-10-CM | POA: Diagnosis not present

## 2021-02-15 DIAGNOSIS — E1142 Type 2 diabetes mellitus with diabetic polyneuropathy: Secondary | ICD-10-CM | POA: Diagnosis not present

## 2021-02-15 LAB — GLUCOSE, CAPILLARY
Glucose-Capillary: 132 mg/dL — ABNORMAL HIGH (ref 70–99)
Glucose-Capillary: 221 mg/dL — ABNORMAL HIGH (ref 70–99)
Glucose-Capillary: 191 mg/dL — ABNORMAL HIGH (ref 70–99)
Glucose-Capillary: 219 mg/dL — ABNORMAL HIGH (ref 70–99)

## 2021-02-15 MED ORDER — ALUM & MAG HYDROXIDE-SIMETH 200-200-20 MG/5ML PO SUSP
30.0000 mL | Freq: Once | ORAL | Status: AC
  Administered 2021-02-15: 23:00:00 30 mL via ORAL
  Filled 2021-02-15: qty 30

## 2021-02-15 MED ORDER — GABAPENTIN 100 MG PO CAPS
100.0000 mg | ORAL_CAPSULE | Freq: Every day | ORAL | Status: DC
  Administered 2021-02-15 – 2021-02-21 (×7): 100 mg via ORAL
  Filled 2021-02-15 (×7): qty 1

## 2021-02-15 NOTE — Progress Notes (Signed)
Follow-up right great toe ulceration.  Initially plan to perform an MRI but he has a pacemaker placed.  I ordered a bone scan.  Unfortunately the material for the bone scan has to be brought in from [place] and will not arrive until Monday.  Patient has been seen by vascular surgery and are planning on angio on Monday as well.  For now we will just continue monitoring and dressings to the great toe while in-house.  We will follow-up early next week.

## 2021-02-15 NOTE — Progress Notes (Signed)
Patient complained of irregular heartbeats that are occasionally painful. He has history of pacemaker in place and stated that he was told he had afib in the past. Informed NP and she stated she would be in touch with cardiology to investigate further.

## 2021-02-15 NOTE — Progress Notes (Signed)
[facility]  ROUNDING NOTE   Subjective:   Patient is resting in bed. Denies any shortness of breath. Reports he has edema in leg of the infected toe. Reports he is having toe pain in Right great toe.  He is eating well. No urinary symptoms.   Objective:  Vital signs in last 24 hours:  Temp:  [97.8 F (36.6 C)-99.1 F (37.3 C)] 98.9 F (37.2 C) (03/05 1214) Pulse Rate:  [52-72] 62 (03/05 1214) Resp:  [14-18] 16 (03/05 1214) BP: (149-177)/(63-113) 164/84 (03/05 1214) SpO2:  [97 %-100 %] 100 % (03/05 1214)  Weight change:  Filed Weights   02/13/21 1133 02/14/21 0424  Weight: 113.4 kg 115.2 kg    Intake/Output: I/O last 3 completed shifts: In: 1475.4 [P.O.:720; I.V.:255.4; IV Piggyback:500] Out: 1300 [Urine:1300]   Intake/Output this shift:  Total I/O In: -  Out: 500 [Urine:500]  Physical Exam: General: NAD,   Head: Normocephalic, atraumatic. Moist oral mucosal membranes  Eyes: Anicteric, PERRL  Neck: Supple, trachea midline  Lungs:  Clear to auscultation  Heart: Regular rate and rhythm  Abdomen:  Soft, nontender,   Extremities:  no peripheral edema in left leg, +edema in right   Neurologic: Nonfocal, moving all four extremities  Skin: No lesions       Basic Metabolic Panel: Recent Labs  Lab 02/13/21 1130 02/13/21 1136 02/14/21 0402  NA 138  --  140  K 3.7  --  3.8  CL 113*  --  115*  CO2 18*  --  20*  GLUCOSE 125*  --  187*  BUN 53*  --  40*  CREATININE 1.43*  --  1.28*  CALCIUM 8.2*  --  8.1*  MG  --  2.0 2.1    Liver Function Tests: Recent Labs  Lab 02/13/21 1130 02/14/21 0402  AST 41 27  ALT 36 28  ALKPHOS 110 83  BILITOT 0.6 0.4  PROT 6.8 6.0*  ALBUMIN 3.5 3.0*   No results for input(s): LIPASE, AMYLASE in the last 168 hours. No results for input(s): AMMONIA in the last 168 hours.  CBC: Recent Labs  Lab 02/13/21 1130 02/14/21 0402  WBC 3.2* 2.9*  NEUTROABS 2.2 1.7  HGB 8.7* 8.4*  HCT 26.5* 26.1*  MCV 94.6 94.9   PLT 100* 102*    Cardiac Enzymes: No results for input(s): CKTOTAL, CKMB, CKMBINDEX, TROPONINI in the last 168 hours.  BNP: Invalid input(s): POCBNP  CBG: Recent Labs  Lab 02/14/21 1128 02/14/21 1608 02/14/21 2111 02/15/21 0811 02/15/21 1201  GLUCAP 209* 173* 152* 132* 221*    Microbiology: Results for orders placed or performed during the hospital encounter of 02/13/21  Resp Panel by RT-PCR (Flu A&B, Covid) Nasopharyngeal Swab     Status: None   Collection Time: 02/13/21  4:03 PM   Specimen: Nasopharyngeal Swab; Nasopharyngeal(NP) swabs in vial transport medium  Result Value Ref Range Status   SARS Coronavirus 2 by RT PCR NEGATIVE NEGATIVE Final    Comment: (NOTE) SARS-CoV-2 target nucleic acids are NOT DETECTED.  The SARS-CoV-2 RNA is generally detectable in upper respiratory specimens during the acute phase of infection. The lowest concentration of SARS-CoV-2 viral copies this assay can detect is 138 copies/mL. A negative result does not preclude SARS-Cov-2 infection and should not be used as the sole basis for treatment or other patient management decisions. A negative result may occur with  improper specimen collection/handling, submission of specimen other than nasopharyngeal swab, presence of viral mutation(s) within the areas  targeted by this assay, and inadequate number of viral copies(<138 copies/mL). A negative result must be combined with clinical observations, patient history, and epidemiological information. The expected result is Negative.  Fact Sheet for Patients:  EntrepreneurPulse.com.au  Fact Sheet for Healthcare Providers:  IncredibleEmployment.be  This test is no t yet approved or cleared by the [place] FDA and  has been authorized for detection and/or diagnosis of SARS-CoV-2 by FDA under an Emergency Use Authorization (EUA). This EUA will remain  in effect (meaning this test can be used) for the  duration of the COVID-19 declaration under Section 564(b)(1) of the Act, 21 U.S.C.section 360bbb-3(b)(1), unless the authorization is terminated  or revoked sooner.       Influenza A by PCR NEGATIVE NEGATIVE Final   Influenza B by PCR NEGATIVE NEGATIVE Final    Comment: (NOTE) The Xpert Xpress SARS-CoV-2/FLU/RSV plus assay is intended as an aid in the diagnosis of influenza from Nasopharyngeal swab specimens and should not be used as a sole basis for treatment. Nasal washings and aspirates are unacceptable for Xpert Xpress SARS-CoV-2/FLU/RSV testing.  Fact Sheet for Patients: EntrepreneurPulse.com.au  Fact Sheet for Healthcare Providers: IncredibleEmployment.be  This test is not yet approved or cleared by the [place] FDA and has been authorized for detection and/or diagnosis of SARS-CoV-2 by FDA under an Emergency Use Authorization (EUA). This EUA will remain in effect (meaning this test can be used) for the duration of the COVID-19 declaration under Section 564(b)(1) of the Act, 21 U.S.C. section 360bbb-3(b)(1), unless the authorization is terminated or revoked.  Performed at [facility], [address] [place], [place] [address]   Culture, blood (Routine X 2) w Reflex to ID Panel     Status: None (Preliminary result)   Collection Time: 02/13/21  7:01 PM   Specimen: BLOOD  Result Value Ref Range Status   Specimen Description BLOOD RIGHT ANTECUBITAL  Final   Special Requests   Final    BOTTLES DRAWN AEROBIC AND ANAEROBIC Blood Culture adequate volume   Culture   Final    NO GROWTH 2 DAYS Performed at [facility], [address], [place], [place] [address]    Report Status PENDING  Incomplete  Culture, blood (Routine X 2) w Reflex to ID Panel     Status: None (Preliminary result)   Collection Time: 02/13/21  7:10 PM   Specimen: BLOOD  Result Value Ref Range Status   Specimen Description BLOOD BLOOD LEFT  ARM  Final   Special Requests   Final    BOTTLES DRAWN AEROBIC AND ANAEROBIC Blood Culture adequate volume   Culture   Final    NO GROWTH 2 DAYS Performed at [facility], [address] [place], [place] [address]    Report Status PENDING  Incomplete    Coagulation Studies: Recent Labs    02/14/21 0402  LABPROT 15.7*  INR 1.3*    Urinalysis: Recent Labs    02/13/21 1507  COLORURINE STRAW*  LABSPEC 1.011  PHURINE 5.0  GLUCOSEU NEGATIVE  HGBUR NEGATIVE  BILIRUBINUR NEGATIVE  KETONESUR NEGATIVE  PROTEINUR NEGATIVE  NITRITE NEGATIVE  LEUKOCYTESUR NEGATIVE      Imaging: DG Foot Complete Right  Result Date: 02/13/2021 CLINICAL DATA:  Right great toe ulcer. EXAM: RIGHT FOOT COMPLETE - 3+ VIEW COMPARISON:  No prior. FINDINGS: Soft tissue swelling. Ulceration of the right great toe noted. No radiopaque foreign body. No adjacent bony erosion. If osteomyelitis remains a clinical concern MRI can be obtained.  Diffuse degenerative change. No fracture or dislocation. Peripheral vascular calcification. IMPRESSION: 1. Ulceration noted about the right great toe. No radiopaque foreign body. No adjacent bony erosion. If osteomyelitis remains a clinical concern MRI can be obtained. 2.  Diffuse degenerative change.  No acute bony abnormality. 3.  Peripheral vascular disease. Electronically Signed   By: Marcello Moores  Register   On: 02/13/2021 14:59     Medications:   . sodium chloride Stopped (02/13/21 2340)  . cefTRIAXone (ROCEPHIN)  IV 2 g (02/14/21 2032)  . vancomycin 1,500 mg (02/14/21 1512)   . carvedilol  6.25 mg Oral BID  . insulin aspart  0-9 Units Subcutaneous TID WC  . insulin detemir  10 Units Subcutaneous BID  . loratadine  10 mg Oral Daily  . multivitamin with minerals  1 tablet Oral Daily  . pantoprazole  40 mg Oral Daily  . Ensure Max Protein  11 oz Oral Daily  . rosuvastatin  40 mg Oral QHS  . sertraline  200 mg Oral Daily  . tamsulosin  0.4 mg Oral Daily    sodium chloride, acetaminophen **OR** acetaminophen, fentaNYL (SUBLIMAZE) injection, oxyCODONE  Assessment/ Plan:  Mr. Nathan Singleton is a 80 y.o.  male Caucasian male with past medical history of type 2 diabetes, severe aortic stenosis status post TAVR, pacemaker, hypertension, and chronic anemia.  He presented to the emergency room with complaints of progressive right foot pain.   1. 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  Creatinine on admission 1.43, current creatinine 1.28  Will continue to monitor.  No need for dialysis at this time.   2. Diabetes type 2 with CKD  - continue management per primary team   3. Infected right great toe  - podiatry following  - vascular planning for angiogram of right LE   LOS: [place] 3/5/202212:29 PM

## 2021-02-15 NOTE — Progress Notes (Signed)
PROGRESS NOTE    Nathan Singleton  [id number] DOB: 1940/12/24 DOA: 02/13/2021 PCP: Ranae Plumber, PA    Brief Narrative:  Patient with type 2 diabetes, severe aortic stenosis status post TAVR, SSS s/p pacemaker, hypertension, chronic anemia presented to the ER with increasing pain and swelling of the right foot especially right great toe.  Patient does have history of chronic right foot ulceration and recently worsening.  X-ray did not show osteomyelitis.   Assessment & Plan:   Principal Problem:   Diabetic foot ulcer ([place]) Active Problems:   Hypertension   DM2 (diabetes mellitus, type 2) ([place])   Hyperlipidemia associated with type 2 diabetes mellitus ([place])   Anemia   BPH (benign prostatic hyperplasia)  Diabetic foot ulceration/diabetic toe with spreading cellulitis: Also likely ischemic ulcer. Significant surrounding soft tissue swelling, continue vancomycin and Rocephin.  Discontinue Flagyl.  Blood cultures pending.  No local cultures available. Vascular surgery consulted.  Bilateral lower extremity angiogram on 3/7. Followed by podiatry surgery, possible debridement after vascular surgery clearance. Bone scan ordered to look for osteomyelitis.  Sick sinus syndrome: s/p pacemaker placement. Cardio note says MRI compatible. Patient's fianc will look for pacemaker information card. Will order MRI instead of bone scan if we can verify that. We will interrogate pacemaker to see if he had any events last night.  Type 2 diabetes: A1c 7.5.  On insulin and Victoza at home.  Continue.  Resumed long-acting insulin.  Hyperlipidemia: Stable on a statin.  Tolerating.  CKD stage IIIa: Renal functions at about baseline.  Followed by nephrology to prepare for angiogram.   DVT prophylaxis: SCDs Start: 02/13/21 1800   Code Status: DNR Family Communication: Patient's daughter on the phone.  Fianc on the phone. Disposition Plan: Status is: Inpatient  Remains inpatient appropriate  because:IV treatments appropriate due to intensity of illness or inability to take PO and Inpatient level of care appropriate due to severity of illness   Dispo: The patient is from: Home              Anticipated d/c is to: Home              Patient currently is not medically stable to d/c.   Difficult to place patient No         Consultants:   Vascular surgery  Podiatry surgery  Nephrology  Procedures:   None  Antimicrobials:  Antibiotics Given (last 72 hours)    Date/Time Action Medication Dose Rate   02/13/21 1602 New Bag/Given   vancomycin (VANCOCIN) IVPB 1000 mg/200 mL premix 1,000 mg 200 mL/hr   02/13/21 2010 New Bag/Given   vancomycin (VANCOREADY) IVPB 1500 mg/300 mL 1,500 mg 150 mL/hr   02/13/21 2143 New Bag/Given   cefTRIAXone (ROCEPHIN) 2 g in sodium chloride 0.9 % 100 mL IVPB 2 g 200 mL/hr   02/13/21 2156 Given   metroNIDAZOLE (FLAGYL) tablet 500 mg 500 mg    02/14/21 0519 Given   metroNIDAZOLE (FLAGYL) tablet 500 mg 500 mg    02/14/21 1302 Given   metroNIDAZOLE (FLAGYL) tablet 500 mg 500 mg    02/14/21 1512 New Bag/Given   vancomycin (VANCOREADY) IVPB 1500 mg/300 mL 1,500 mg 150 mL/hr   02/14/21 2032 New Bag/Given   cefTRIAXone (ROCEPHIN) 2 g in sodium chloride 0.9 % 100 mL IVPB 2 g 200 mL/hr   02/14/21 2149 Given   metroNIDAZOLE (FLAGYL) tablet 500 mg 500 mg    02/15/21 0531 Given   metroNIDAZOLE (FLAGYL) tablet 500 mg 500  mg          Subjective: Patient seen and examined.  He is very hard of hearing.  Currently denies any complaints. Patient stated that he had 2 episodes of palpitation last night and he thinks he went into A. fib.  Currently sinus rhythm. Initiated on telemetry.  Asymptomatic.  No intervention needed. Will interrogate pacemaker.  Objective: Vitals:   02/14/21 2109 02/15/21 0418 02/15/21 0810 02/15/21 1214  BP: (!) 159/113 (!) 170/73 (!) 177/73 (!) 164/84  Pulse: 72 60 (!) 57 62  Resp: '16 18 14 16  '$ Temp: 98.7 F (37.1 C)  99.1 F (37.3 C) 99 F (37.2 C) 98.9 F (37.2 C)  TempSrc:  Oral Oral   SpO2: 97% 98% 100% 100%  Weight:      Height:        Intake/Output Summary (Last 24 hours) at 02/15/2021 1218 Last data filed at 02/15/2021 0900 Gross per 24 hour  Intake 640 ml  Output 1000 ml  Net -360 ml   Filed Weights   02/13/21 1133 02/14/21 0424  Weight: 113.4 kg 115.2 kg    Examination:  General exam: Appears calm and comfortable. hard of hearing. Respiratory system: Clear to auscultation. Respiratory effort normal. Cardiovascular system: S1 & S2 heard, RRR. No JVD, murmurs, rubs, gallops or clicks. No pedal edema.  Pacemaker present. Gastrointestinal system: Abdomen is nondistended, soft and nontender. No organomegaly or masses felt. Normal bowel sounds heard. Central nervous system: Alert and oriented. No focal neurological deficits. Extremities: Right foot is warm and edematous up to the ankles.  No fluctuation. There is a chronic appearing, tender and non- draining wound on the medial and posterior aspect of the right great toe. Poor peripheral pulses.     Data Reviewed: I have personally reviewed following labs and imaging studies  CBC: Recent Labs  Lab 02/13/21 1130 02/14/21 0402  WBC 3.2* 2.9*  NEUTROABS 2.2 1.7  HGB 8.7* 8.4*  HCT 26.5* 26.1*  MCV 94.6 94.9  PLT 100* [id number]*   Basic Metabolic Panel: Recent Labs  Lab 02/13/21 1130 02/13/21 1136 02/14/21 0402  NA 138  --  140  K 3.7  --  3.8  CL 113*  --  115*  CO2 18*  --  20*  GLUCOSE 125*  --  187*  BUN 53*  --  40*  CREATININE 1.43*  --  1.28*  CALCIUM 8.2*  --  8.1*  MG  --  2.0 2.1   GFR: Estimated Creatinine Clearance: 62.3 mL/min (A) (by C-G formula based on SCr of 1.28 mg/dL (H)). Liver Function Tests: Recent Labs  Lab 02/13/21 1130 02/14/21 0402  AST 41 27  ALT 36 28  ALKPHOS 110 83  BILITOT 0.6 0.4  PROT 6.8 6.0*  ALBUMIN 3.5 3.0*   No results for input(s): LIPASE, AMYLASE in the last 168  hours. No results for input(s): AMMONIA in the last 168 hours. Coagulation Profile: Recent Labs  Lab 02/14/21 0402  INR 1.3*   Cardiac Enzymes: No results for input(s): CKTOTAL, CKMB, CKMBINDEX, TROPONINI in the last 168 hours. BNP (last 3 results) No results for input(s): PROBNP in the last 8760 hours. HbA1C: No results for input(s): HGBA1C in the last 72 hours. CBG: Recent Labs  Lab 02/14/21 1128 02/14/21 1608 02/14/21 2111 02/15/21 0811 02/15/21 1201  GLUCAP 209* 173* 152* 132* 221*   Lipid Profile: No results for input(s): CHOL, HDL, LDLCALC, TRIG, CHOLHDL, LDLDIRECT in the last 72 hours. Thyroid Function Tests: No  results for input(s): TSH, T4TOTAL, FREET4, T3FREE, THYROIDAB in the last 72 hours. Anemia Panel: Recent Labs    02/13/21 1136  FERRITIN 211  TIBC 258  IRON 31*   Sepsis Labs: Recent Labs  Lab 02/13/21 1130 02/14/21 0402  LATICACIDVEN 1.1 1.1    Recent Results (from the past 240 hour(s))  Resp Panel by RT-PCR (Flu A&B, Covid) Nasopharyngeal Swab     Status: None   Collection Time: 02/13/21  4:03 PM   Specimen: Nasopharyngeal Swab; Nasopharyngeal(NP) swabs in vial transport medium  Result Value Ref Range Status   SARS Coronavirus 2 by RT PCR NEGATIVE NEGATIVE Final    Comment: (NOTE) SARS-CoV-2 target nucleic acids are NOT DETECTED.  The SARS-CoV-2 RNA is generally detectable in upper respiratory specimens during the acute phase of infection. The lowest concentration of SARS-CoV-2 viral copies this assay can detect is 138 copies/mL. A negative result does not preclude SARS-Cov-2 infection and should not be used as the sole basis for treatment or other patient management decisions. A negative result may occur with  improper specimen collection/handling, submission of specimen other than nasopharyngeal swab, presence of viral mutation(s) within the areas targeted by this assay, and inadequate number of viral copies(<138 copies/mL). A negative  result must be combined with clinical observations, patient history, and epidemiological information. The expected result is Negative.  Fact Sheet for Patients:  EntrepreneurPulse.com.au  Fact Sheet for Healthcare Providers:  IncredibleEmployment.be  This test is no t yet approved or cleared by the [place] FDA and  has been authorized for detection and/or diagnosis of SARS-CoV-2 by FDA under an Emergency Use Authorization (EUA). This EUA will remain  in effect (meaning this test can be used) for the duration of the COVID-19 declaration under Section 564(b)(1) of the Act, 21 U.S.C.section 360bbb-3(b)(1), unless the authorization is terminated  or revoked sooner.       Influenza A by PCR NEGATIVE NEGATIVE Final   Influenza B by PCR NEGATIVE NEGATIVE Final    Comment: (NOTE) The Xpert Xpress SARS-CoV-2/FLU/RSV plus assay is intended as an aid in the diagnosis of influenza from Nasopharyngeal swab specimens and should not be used as a sole basis for treatment. Nasal washings and aspirates are unacceptable for Xpert Xpress SARS-CoV-2/FLU/RSV testing.  Fact Sheet for Patients: EntrepreneurPulse.com.au  Fact Sheet for Healthcare Providers: IncredibleEmployment.be  This test is not yet approved or cleared by the [place] FDA and has been authorized for detection and/or diagnosis of SARS-CoV-2 by FDA under an Emergency Use Authorization (EUA). This EUA will remain in effect (meaning this test can be used) for the duration of the COVID-19 declaration under Section 564(b)(1) of the Act, 21 U.S.C. section 360bbb-3(b)(1), unless the authorization is terminated or revoked.  Performed at [facility], [address] [place], [place] [address]   Culture, blood (Routine X 2) w Reflex to ID Panel     Status: None (Preliminary result)   Collection Time: 02/13/21  7:01 PM   Specimen: BLOOD  Result  Value Ref Range Status   Specimen Description BLOOD RIGHT ANTECUBITAL  Final   Special Requests   Final    BOTTLES DRAWN AEROBIC AND ANAEROBIC Blood Culture adequate volume   Culture   Final    NO GROWTH 2 DAYS Performed at [facility], [address] [place], [place] [address]    Report Status PENDING  Incomplete  Culture, blood (Routine X 2) w Reflex to ID Panel     Status: None (Preliminary result)  Collection Time: 02/13/21  7:10 PM   Specimen: BLOOD  Result Value Ref Range Status   Specimen Description BLOOD BLOOD LEFT ARM  Final   Special Requests   Final    BOTTLES DRAWN AEROBIC AND ANAEROBIC Blood Culture adequate volume   Culture   Final    NO GROWTH 2 DAYS Performed at [facility], [address] [place], [place] [address]    Report Status PENDING  Incomplete         Radiology Studies: DG Foot Complete Right  Result Date: 02/13/2021 CLINICAL DATA:  Right great toe ulcer. EXAM: RIGHT FOOT COMPLETE - 3+ VIEW COMPARISON:  No prior. FINDINGS: Soft tissue swelling. Ulceration of the right great toe noted. No radiopaque foreign body. No adjacent bony erosion. If osteomyelitis remains a clinical concern MRI can be obtained. Diffuse degenerative change. No fracture or dislocation. Peripheral vascular calcification. IMPRESSION: 1. Ulceration noted about the right great toe. No radiopaque foreign body. No adjacent bony erosion. If osteomyelitis remains a clinical concern MRI can be obtained. 2.  Diffuse degenerative change.  No acute bony abnormality. 3.  Peripheral vascular disease. Electronically Signed   By: [place]   On: 02/13/2021 14:59        Scheduled Meds: . carvedilol  6.25 mg Oral BID  . insulin aspart  0-9 Units Subcutaneous TID WC  . insulin detemir  10 Units Subcutaneous BID  . loratadine  10 mg Oral Daily  . multivitamin with minerals  1 tablet Oral Daily  . pantoprazole  40 mg Oral Daily  . Ensure Max Protein  11 oz Oral  Daily  . rosuvastatin  40 mg Oral QHS  . sertraline  200 mg Oral Daily  . tamsulosin  0.4 mg Oral Daily   Continuous Infusions: . sodium chloride Stopped (02/13/21 2340)  . cefTRIAXone (ROCEPHIN)  IV 2 g (02/14/21 2032)  . vancomycin 1,500 mg (02/14/21 1512)     LOS: 2 days    Time spent: 30 minutes    Barb Merino, MD [facility] Pager [phone]

## 2021-02-16 DIAGNOSIS — Z794 Long term (current) use of insulin: Secondary | ICD-10-CM | POA: Diagnosis not present

## 2021-02-16 DIAGNOSIS — E11621 Type 2 diabetes mellitus with foot ulcer: Secondary | ICD-10-CM | POA: Diagnosis not present

## 2021-02-16 DIAGNOSIS — E1142 Type 2 diabetes mellitus with diabetic polyneuropathy: Secondary | ICD-10-CM | POA: Diagnosis not present

## 2021-02-16 DIAGNOSIS — L97519 Non-pressure chronic ulcer of other part of right foot with unspecified severity: Secondary | ICD-10-CM | POA: Diagnosis not present

## 2021-02-16 LAB — GLUCOSE, CAPILLARY
Glucose-Capillary: 166 mg/dL — ABNORMAL HIGH (ref 70–99)
Glucose-Capillary: 293 mg/dL — ABNORMAL HIGH (ref 70–99)
Glucose-Capillary: 243 mg/dL — ABNORMAL HIGH (ref 70–99)
Glucose-Capillary: 288 mg/dL — ABNORMAL HIGH (ref 70–99)

## 2021-02-16 LAB — BASIC METABOLIC PANEL
Anion gap: 5 (ref 5–15)
BUN: 37 mg/dL — ABNORMAL HIGH (ref 8–23)
CO2: 22 mmol/L (ref 22–32)
Calcium: 8.3 mg/dL — ABNORMAL LOW (ref 8.9–10.3)
Chloride: 112 mmol/L — ABNORMAL HIGH (ref 98–111)
Creatinine, Ser: 1.19 mg/dL (ref 0.61–1.24)
GFR, Estimated: >60 mL/min (ref >60)
Glucose, Bld: 187 mg/dL — ABNORMAL HIGH (ref 70–99)
Potassium: 4.2 mmol/L (ref 3.5–5.1)
Sodium: 139 mmol/L (ref 135–145)

## 2021-02-16 LAB — CBC WITH DIFFERENTIAL/PLATELET
Abs Immature Granulocytes: 0.01 10*3/uL (ref 0.00–0.07)
Basophils Absolute: 0 10*3/uL (ref 0.0–0.1)
Basophils Relative: 1 %
Eosinophils Absolute: 0.1 10*3/uL (ref 0.0–0.5)
Eosinophils Relative: 3 %
HCT: 26.7 % — ABNORMAL LOW (ref 39.0–52.0)
Hemoglobin: 9.1 g/dL — ABNORMAL LOW (ref 13.0–17.0)
Immature Granulocytes: 0 %
Lymphocytes Relative: 17 %
Lymphs Abs: 0.7 10*3/uL (ref 0.7–4.0)
MCH: 31.8 pg (ref 26.0–34.0)
MCHC: 34.1 g/dL (ref 30.0–36.0)
MCV: 93.4 fL (ref 80.0–100.0)
Monocytes Absolute: 0.3 10*3/uL (ref 0.1–1.0)
Monocytes Relative: 8 %
Neutro Abs: 3 10*3/uL (ref 1.7–7.7)
Neutrophils Relative %: 71 %
Platelets: 103 10*3/uL — ABNORMAL LOW (ref 150–400)
RBC: 2.86 MIL/uL — ABNORMAL LOW (ref 4.22–5.81)
RDW: 13.2 % (ref 11.5–15.5)
WBC: 4.3 10*3/uL (ref 4.0–10.5)
nRBC: 0 % (ref 0.0–0.2)

## 2021-02-16 LAB — MAGNESIUM: Magnesium: 2 mg/dL (ref 1.7–2.4)

## 2021-02-16 MED ORDER — CALCIUM CARBONATE ANTACID 500 MG PO CHEW
1.0000 | CHEWABLE_TABLET | Freq: Three times a day (TID) | ORAL | Status: DC
  Administered 2021-02-16 – 2021-02-22 (×11): 200 mg via ORAL
  Filled 2021-02-16 (×12): qty 1

## 2021-02-16 MED ORDER — AMLODIPINE BESYLATE 10 MG PO TABS
10.0000 mg | ORAL_TABLET | Freq: Every day | ORAL | Status: DC
  Administered 2021-02-16 – 2021-02-22 (×6): 10 mg via ORAL
  Filled 2021-02-16 (×6): qty 1

## 2021-02-16 MED ORDER — BACLOFEN 10 MG PO TABS
10.0000 mg | ORAL_TABLET | Freq: Two times a day (BID) | ORAL | Status: DC
  Administered 2021-02-16 – 2021-02-22 (×11): 10 mg via ORAL
  Filled 2021-02-16 (×14): qty 1

## 2021-02-16 MED ORDER — POLYETHYLENE GLYCOL 3350 17 G PO PACK
17.0000 g | PACK | Freq: Every day | ORAL | Status: DC
  Administered 2021-02-16 – 2021-02-22 (×6): 17 g via ORAL
  Filled 2021-02-16 (×6): qty 1

## 2021-02-16 MED ORDER — ASPIRIN EC 81 MG PO TBEC
81.0000 mg | DELAYED_RELEASE_TABLET | Freq: Every day | ORAL | Status: DC
  Administered 2021-02-16 – 2021-02-22 (×5): 81 mg via ORAL
  Filled 2021-02-16 (×6): qty 1

## 2021-02-16 MED ORDER — SENNOSIDES-DOCUSATE SODIUM 8.6-50 MG PO TABS
1.0000 | ORAL_TABLET | Freq: Two times a day (BID) | ORAL | Status: DC
  Administered 2021-02-16 – 2021-02-22 (×11): 1 via ORAL
  Filled 2021-02-16 (×11): qty 1

## 2021-02-16 MED ORDER — ALLOPURINOL 100 MG PO TABS
100.0000 mg | ORAL_TABLET | Freq: Every day | ORAL | Status: DC
  Administered 2021-02-16 – 2021-02-22 (×6): 100 mg via ORAL
  Filled 2021-02-16 (×6): qty 1

## 2021-02-16 MED ORDER — ISOSORBIDE MONONITRATE ER 30 MG PO TB24
30.0000 mg | ORAL_TABLET | Freq: Every day | ORAL | Status: DC
  Administered 2021-02-16 – 2021-02-22 (×5): 30 mg via ORAL
  Filled 2021-02-16 (×5): qty 1

## 2021-02-16 NOTE — Plan of Care (Signed)
°  Problem: Education: °Goal: Knowledge of General Education information will improve °Description: Including pain rating scale, medication(s)/side effects and non-pharmacologic comfort measures °Outcome: Progressing °  °Problem: Health Behavior/Discharge Planning: °Goal: Ability to manage health-related needs will improve °Outcome: Progressing °  °Problem: Clinical Measurements: °Goal: Ability to maintain clinical measurements within normal limits will improve °Outcome: Progressing °Goal: Will remain free from infection °Outcome: Progressing °Goal: Diagnostic test results will improve °Outcome: Progressing °Goal: Respiratory complications will improve °Outcome: Progressing °Goal: Cardiovascular complication will be avoided °Outcome: Progressing °  °Problem: Activity: °Goal: Risk for activity intolerance will decrease °Outcome: Progressing °  °Problem: Nutrition: °Goal: Adequate nutrition will be maintained °Outcome: Progressing °  °Problem: Coping: °Goal: Level of anxiety will decrease °Outcome: Progressing °  °Problem: Elimination: °Goal: Will not experience complications related to bowel motility °Outcome: Progressing °Goal: Will not experience complications related to urinary retention °Outcome: Progressing °  °Problem: Pain Managment: °Goal: General experience of comfort will improve °Outcome: Progressing °  °Problem: Safety: °Goal: Ability to remain free from injury will improve °Outcome: Progressing °  °Problem: Skin Integrity: °Goal: Risk for impaired skin integrity will decrease °Outcome: Progressing °  °Problem: Metabolic: °Goal: Ability to maintain appropriate glucose levels will improve °Outcome: Progressing °  °

## 2021-02-16 NOTE — Progress Notes (Signed)
[facility]  ROUNDING NOTE   Subjective:   The patient denies any shortness of breath, edema, urinary symptoms. Reports pain around his infected toe.    Objective:  Vital signs in last 24 hours:  Temp:  [98.1 F (36.7 C)-99.2 F (37.3 C)] 98.1 F (36.7 C) (03/06 0814) Pulse Rate:  [56-64] 62 (03/06 0814) Resp:  [16-20] 18 (03/06 0814) BP: (161-178)/(62-84) 178/72 (03/06 0814) SpO2:  [97 %-100 %] 98 % (03/06 0814)  Weight change:  Filed Weights   02/13/21 1133 02/14/21 0424  Weight: 113.4 kg 115.2 kg    Intake/Output: I/O last 3 completed shifts: In: 44 [P.O.:240; I.V.:240; IV Piggyback:400] Out: 1300 [Urine:1300]   Intake/Output this shift:  No intake/output data recorded.  Physical Exam: General: NAD, sitting up the side of the bed eating breakfast  Head: Normocephalic, atraumatic. Moist oral mucosal membranes  Eyes: Anicteric, PERRL  Neck: Supple, trachea midline  Lungs:  Clear to auscultation  Heart: Regular rate and rhythm  Abdomen:  Soft, nontender,   Extremities:  no peripheral edema.  Neurologic: Alert and oriented   Skin: No lesions       Basic Metabolic Panel: Recent Labs  Lab 02/13/21 1130 02/13/21 1136 02/14/21 0402 02/16/21 0420  NA 138  --  140 139  K 3.7  --  3.8 4.2  CL 113*  --  115* 112*  CO2 18*  --  20* 22  GLUCOSE 125*  --  187* 187*  BUN 53*  --  40* 37*  CREATININE 1.43*  --  1.28* 1.19  CALCIUM 8.2*  --  8.1* 8.3*  MG  --  2.0 2.1 2.0    Liver Function Tests: Recent Labs  Lab 02/13/21 1130 02/14/21 0402  AST 41 27  ALT 36 28  ALKPHOS 110 83  BILITOT 0.6 0.4  PROT 6.8 6.0*  ALBUMIN 3.5 3.0*   No results for input(s): LIPASE, AMYLASE in the last 168 hours. No results for input(s): AMMONIA in the last 168 hours.  CBC: Recent Labs  Lab 02/13/21 1130 02/14/21 0402 02/16/21 0420  WBC 3.2* 2.9* 4.3  NEUTROABS 2.2 1.7 3.0  HGB 8.7* 8.4* 9.1*  HCT 26.5* 26.1* 26.7*  MCV 94.6 94.9 93.4  PLT 100* 102*  103*    Cardiac Enzymes: No results for input(s): CKTOTAL, CKMB, CKMBINDEX, TROPONINI in the last 168 hours.  BNP: Invalid input(s): POCBNP  CBG: Recent Labs  Lab 02/15/21 0811 02/15/21 1201 02/15/21 1547 02/15/21 2022 02/16/21 0824  GLUCAP 132* 221* 191* 219* 166*    Microbiology: Results for orders placed or performed during the hospital encounter of 02/13/21  Resp Panel by RT-PCR (Flu A&B, Covid) Nasopharyngeal Swab     Status: None   Collection Time: 02/13/21  4:03 PM   Specimen: Nasopharyngeal Swab; Nasopharyngeal(NP) swabs in vial transport medium  Result Value Ref Range Status   SARS Coronavirus 2 by RT PCR NEGATIVE NEGATIVE Final    Comment: (NOTE) SARS-CoV-2 target nucleic acids are NOT DETECTED.  The SARS-CoV-2 RNA is generally detectable in upper respiratory specimens during the acute phase of infection. The lowest concentration of SARS-CoV-2 viral copies this assay can detect is 138 copies/mL. A negative result does not preclude SARS-Cov-2 infection and should not be used as the sole basis for treatment or other patient management decisions. A negative result may occur with  improper specimen collection/handling, submission of specimen other than nasopharyngeal swab, presence of viral mutation(s) within the areas targeted by this assay, and inadequate  number of viral copies(<138 copies/mL). A negative result must be combined with clinical observations, patient history, and epidemiological information. The expected result is Negative.  Fact Sheet for Patients:  EntrepreneurPulse.com.au  Fact Sheet for Healthcare Providers:  IncredibleEmployment.be  This test is no t yet approved or cleared by the [place] FDA and  has been authorized for detection and/or diagnosis of SARS-CoV-2 by FDA under an Emergency Use Authorization (EUA). This EUA will remain  in effect (meaning this test can be used) for the duration of  the COVID-19 declaration under Section 564(b)(1) of the Act, 21 U.S.C.section 360bbb-3(b)(1), unless the authorization is terminated  or revoked sooner.       Influenza A by PCR NEGATIVE NEGATIVE Final   Influenza B by PCR NEGATIVE NEGATIVE Final    Comment: (NOTE) The Xpert Xpress SARS-CoV-2/FLU/RSV plus assay is intended as an aid in the diagnosis of influenza from Nasopharyngeal swab specimens and should not be used as a sole basis for treatment. Nasal washings and aspirates are unacceptable for Xpert Xpress SARS-CoV-2/FLU/RSV testing.  Fact Sheet for Patients: EntrepreneurPulse.com.au  Fact Sheet for Healthcare Providers: IncredibleEmployment.be  This test is not yet approved or cleared by the [place] FDA and has been authorized for detection and/or diagnosis of SARS-CoV-2 by FDA under an Emergency Use Authorization (EUA). This EUA will remain in effect (meaning this test can be used) for the duration of the COVID-19 declaration under Section 564(b)(1) of the Act, 21 U.S.C. section 360bbb-3(b)(1), unless the authorization is terminated or revoked.  Performed at [facility], [address] [place], [place] [address]   Culture, blood (Routine X 2) w Reflex to ID Panel     Status: None (Preliminary result)   Collection Time: 02/13/21  7:01 PM   Specimen: BLOOD  Result Value Ref Range Status   Specimen Description BLOOD RIGHT ANTECUBITAL  Final   Special Requests   Final    BOTTLES DRAWN AEROBIC AND ANAEROBIC Blood Culture adequate volume   Culture   Final    NO GROWTH 2 DAYS Performed at [facility], [address], [place], [place] [address]    Report Status PENDING  Incomplete  Culture, blood (Routine X 2) w Reflex to ID Panel     Status: None (Preliminary result)   Collection Time: 02/13/21  7:10 PM   Specimen: BLOOD  Result Value Ref Range Status   Specimen Description BLOOD BLOOD LEFT ARM  Final    Special Requests   Final    BOTTLES DRAWN AEROBIC AND ANAEROBIC Blood Culture adequate volume   Culture   Final    NO GROWTH 2 DAYS Performed at [facility], [address], [place], [place] [address]    Report Status PENDING  Incomplete    Coagulation Studies: Recent Labs    02/14/21 0402  LABPROT 15.7*  INR 1.3*    Urinalysis: Recent Labs    02/13/21 1507  COLORURINE STRAW*  LABSPEC 1.011  PHURINE 5.0  GLUCOSEU NEGATIVE  HGBUR NEGATIVE  BILIRUBINUR NEGATIVE  KETONESUR NEGATIVE  PROTEINUR NEGATIVE  NITRITE NEGATIVE  LEUKOCYTESUR NEGATIVE      Imaging: No results found.   Medications:   . sodium chloride Stopped (02/13/21 2340)  . cefTRIAXone (ROCEPHIN)  IV 2 g (02/15/21 1958)  . vancomycin 1,500 mg (02/15/21 1523)   . carvedilol  6.25 mg Oral BID  . gabapentin  100 mg Oral QHS  . insulin aspart  0-9 Units Subcutaneous TID WC  . insulin  detemir  10 Units Subcutaneous BID  . loratadine  10 mg Oral Daily  . multivitamin with minerals  1 tablet Oral Daily  . pantoprazole  40 mg Oral Daily  . Ensure Max Protein  11 oz Oral Daily  . rosuvastatin  40 mg Oral QHS  . sertraline  200 mg Oral Daily  . tamsulosin  0.4 mg Oral Daily   sodium chloride, acetaminophen **OR** acetaminophen, fentaNYL (SUBLIMAZE) injection, oxyCODONE  Assessment/ Plan:  Mr. Nathan Singleton is a 80 y.o.  male with past medical history of type 2 diabetes,severe aortic stenosis status post TAVR,pacemaker,hypertension,and chronic anemia. He presented to the emergency room with complaints of progressive right foot pain.   1. Acute kidney injury on CKD stage IIIa  with baseline creatinine1.07on12/7/21. -Creatinine on admission 1.43, current creatinine 1.19 improving  -Will continue to monitor kidney function  -No need for dialysis at this time.  2. Diabetes type 2 with CKD  - continue management per primary team   3. Infected right great toe  - podiatry following   - vascular planning for angiogram of right LE on 3/7. Patient is npo after midnight tonight    LOS: [place] 3/6/20229:46 AM

## 2021-02-16 NOTE — Progress Notes (Signed)
PROGRESS NOTE    Nathan Singleton  [id number] DOB: [birth year] DOA: 02/13/2021 PCP: Ranae Plumber, PA    Brief Narrative:  Patient with type 2 diabetes, severe aortic stenosis status post TAVR, SSS s/p pacemaker, hypertension, chronic anemia presented to the ER with increasing pain and swelling of the right foot especially right great toe.  Patient does have history of chronic right foot ulceration and recently worsening.  X-ray did not show osteomyelitis.   Assessment & Plan:   Principal Problem:   Diabetic foot ulcer ([place]) Active Problems:   Hypertension   DM2 (diabetes mellitus, type 2) ([place])   Hyperlipidemia associated with type 2 diabetes mellitus ([place])   Anemia   BPH (benign prostatic hyperplasia)  Diabetic foot ulceration/diabetic toe with spreading cellulitis: Also likely ischemic ulcer. Significant surrounding soft tissue swelling, continue vancomycin and Rocephin. Blood cultures are negative so far. No local cultures available. Vascular surgery consulted.  Bilateral lower extremity angiogram on 3/7. Followed by podiatry surgery, possible debridement after vascular surgery clearance. Bone scan ordered to look for osteomyelitis. Pacemaker is compatible to MRI, however radiology unable to perform at [facility].  Sick sinus syndrome: s/p pacemaker placement.  MRI compatible. Interrogated with some palpitations at night.  Waiting results.  Patient complains of palpitations, telemetry shows PVCs.  Will monitor in telemetry.  Apparently, he has not needed a paced rhythm.  Type 2 diabetes: A1c 7.5.  On insulin and Victoza at home.  Continue.  Resumed long-acting insulin.  Hyperlipidemia: Stable on a statin.  Tolerating.  CKD stage IIIa: Renal functions at about baseline.  Followed by nephrology to prepare for angiogram.   DVT prophylaxis: SCDs Start: 02/13/21 1800   Code Status: Full code. MOST form introduced and given to the patient. Family Communication: Patient's  daughter on the phone.  Patient's fianc at the bedside 3/5. Disposition Plan: Status is: Inpatient  Remains inpatient appropriate because:IV treatments appropriate due to intensity of illness or inability to take PO and Inpatient level of care appropriate due to severity of illness   Dispo: The patient is from: Home              Anticipated d/c is to: Home              Patient currently is not medically stable to d/c.   Difficult to place patient No         Consultants:   Vascular surgery  Podiatry surgery  Nephrology  Procedures:   None  Antimicrobials:  Antibiotics Given (last 72 hours)    Date/Time Action Medication Dose Rate   02/13/21 1602 New Bag/Given   vancomycin (VANCOCIN) IVPB 1000 mg/200 mL premix 1,000 mg 200 mL/hr   02/13/21 2010 New Bag/Given   vancomycin (VANCOREADY) IVPB 1500 mg/300 mL 1,500 mg 150 mL/hr   02/13/21 2143 New Bag/Given   cefTRIAXone (ROCEPHIN) 2 g in sodium chloride 0.9 % 100 mL IVPB 2 g 200 mL/hr   02/13/21 2156 Given   metroNIDAZOLE (FLAGYL) tablet 500 mg 500 mg    02/14/21 0519 Given   metroNIDAZOLE (FLAGYL) tablet 500 mg 500 mg    02/14/21 1302 Given   metroNIDAZOLE (FLAGYL) tablet 500 mg 500 mg    02/14/21 1512 New Bag/Given   vancomycin (VANCOREADY) IVPB 1500 mg/300 mL 1,500 mg 150 mL/hr   02/14/21 2032 New Bag/Given   cefTRIAXone (ROCEPHIN) 2 g in sodium chloride 0.9 % 100 mL IVPB 2 g 200 mL/hr   02/14/21 2149 Given   metroNIDAZOLE (FLAGYL) tablet 500  mg 500 mg    02/15/21 0531 Given   metroNIDAZOLE (FLAGYL) tablet 500 mg 500 mg    02/15/21 1523 New Bag/Given   vancomycin (VANCOREADY) IVPB 1500 mg/300 mL 1,500 mg 150 mL/hr   02/15/21 1958 New Bag/Given   cefTRIAXone (ROCEPHIN) 2 g in sodium chloride 0.9 % 100 mL IVPB 2 g 200 mL/hr         Subjective: Patient seen and examined.  No new events.  Again last night when they were manipulating his IV line, he had short period Of fluttering and palpitation that has resolved  now.  Telemetry shows PVCs.  Objective: Vitals:   02/16/21 0413 02/16/21 0640 02/16/21 0814 02/16/21 1044  BP: (!) 178/71 (!) 163/74 (!) 178/72 (!) 160/68  Pulse: (!) 56  62 (!) 59  Resp: '19  18 18  '$ Temp: 98.1 F (36.7 C)  98.1 F (36.7 C)   TempSrc:   Oral   SpO2: 97%  98% 100%  Weight:      Height:        Intake/Output Summary (Last 24 hours) at 02/16/2021 1121 Last data filed at 02/16/2021 0700 Gross per 24 hour  Intake 240 ml  Output 300 ml  Net -60 ml   Filed Weights   02/13/21 1133 02/14/21 0424  Weight: 113.4 kg 115.2 kg    Examination:  General exam: Appears calm and comfortable.  Pleasant to conversation. Respiratory system: Clear to auscultation. Respiratory effort normal. Cardiovascular system: S1 & S2 heard, RRR. No JVD, murmurs, rubs, gallops or clicks. No pedal edema.  Pacemaker present. Gastrointestinal system: Abdomen is nondistended, soft and nontender. No organomegaly or masses felt. Normal bowel sounds heard. Central nervous system: Alert and oriented. No focal neurological deficits. Extremities: Right great toe with mildly tender ulcer on posterior aspect.  Rest of the foot with trace edema.  Swelling and erythema improved.  Poor peripheral pulses.    Data Reviewed: I have personally reviewed following labs and imaging studies  CBC: Recent Labs  Lab 02/13/21 1130 02/14/21 0402 02/16/21 0420  WBC 3.2* 2.9* 4.3  NEUTROABS 2.2 1.7 3.0  HGB 8.7* 8.4* 9.1*  HCT 26.5* 26.1* 26.7*  MCV 94.6 94.9 93.4  PLT 100* 102* [id number]*   Basic Metabolic Panel: Recent Labs  Lab 02/13/21 1130 02/13/21 1136 02/14/21 0402 02/16/21 0420  NA 138  --  140 139  K 3.7  --  3.8 4.2  CL 113*  --  115* 112*  CO2 18*  --  20* 22  GLUCOSE 125*  --  187* 187*  BUN 53*  --  40* 37*  CREATININE 1.43*  --  1.28* 1.19  CALCIUM 8.2*  --  8.1* 8.3*  MG  --  2.0 2.1 2.0   GFR: Estimated Creatinine Clearance: 67 mL/min (by C-G formula based on SCr of 1.19 mg/dL). Liver  Function Tests: Recent Labs  Lab 02/13/21 1130 02/14/21 0402  AST 41 27  ALT 36 28  ALKPHOS 110 83  BILITOT 0.6 0.4  PROT 6.8 6.0*  ALBUMIN 3.5 3.0*   No results for input(s): LIPASE, AMYLASE in the last 168 hours. No results for input(s): AMMONIA in the last 168 hours. Coagulation Profile: Recent Labs  Lab 02/14/21 0402  INR 1.3*   Cardiac Enzymes: No results for input(s): CKTOTAL, CKMB, CKMBINDEX, TROPONINI in the last 168 hours. BNP (last 3 results) No results for input(s): PROBNP in the last 8760 hours. HbA1C: No results for input(s): HGBA1C in the last 72 hours.  CBG: Recent Labs  Lab 02/15/21 0811 02/15/21 1201 02/15/21 1547 02/15/21 2022 02/16/21 0824  GLUCAP 132* 221* 191* 219* 166*   Lipid Profile: No results for input(s): CHOL, HDL, LDLCALC, TRIG, CHOLHDL, LDLDIRECT in the last 72 hours. Thyroid Function Tests: No results for input(s): TSH, T4TOTAL, FREET4, T3FREE, THYROIDAB in the last 72 hours. Anemia Panel: Recent Labs    02/13/21 1136  FERRITIN 211  TIBC 258  IRON 31*   Sepsis Labs: Recent Labs  Lab 02/13/21 1130 02/14/21 0402  LATICACIDVEN 1.1 1.1    Recent Results (from the past 240 hour(s))  Resp Panel by RT-PCR (Flu A&B, Covid) Nasopharyngeal Swab     Status: None   Collection Time: 02/13/21  4:03 PM   Specimen: Nasopharyngeal Swab; Nasopharyngeal(NP) swabs in vial transport medium  Result Value Ref Range Status   SARS Coronavirus 2 by RT PCR NEGATIVE NEGATIVE Final    Comment: (NOTE) SARS-CoV-2 target nucleic acids are NOT DETECTED.  The SARS-CoV-2 RNA is generally detectable in upper respiratory specimens during the acute phase of infection. The lowest concentration of SARS-CoV-2 viral copies this assay can detect is 138 copies/mL. A negative result does not preclude SARS-Cov-2 infection and should not be used as the sole basis for treatment or other patient management decisions. A negative result may occur with  improper  specimen collection/handling, submission of specimen other than nasopharyngeal swab, presence of viral mutation(s) within the areas targeted by this assay, and inadequate number of viral copies(<138 copies/mL). A negative result must be combined with clinical observations, patient history, and epidemiological information. The expected result is Negative.  Fact Sheet for Patients:  EntrepreneurPulse.com.au  Fact Sheet for Healthcare Providers:  IncredibleEmployment.be  This test is no t yet approved or cleared by the [place] FDA and  has been authorized for detection and/or diagnosis of SARS-CoV-2 by FDA under an Emergency Use Authorization (EUA). This EUA will remain  in effect (meaning this test can be used) for the duration of the COVID-19 declaration under Section 564(b)(1) of the Act, 21 U.S.C.section 360bbb-3(b)(1), unless the authorization is terminated  or revoked sooner.       Influenza A by PCR NEGATIVE NEGATIVE Final   Influenza B by PCR NEGATIVE NEGATIVE Final    Comment: (NOTE) The Xpert Xpress SARS-CoV-2/FLU/RSV plus assay is intended as an aid in the diagnosis of influenza from Nasopharyngeal swab specimens and should not be used as a sole basis for treatment. Nasal washings and aspirates are unacceptable for Xpert Xpress SARS-CoV-2/FLU/RSV testing.  Fact Sheet for Patients: EntrepreneurPulse.com.au  Fact Sheet for Healthcare Providers: IncredibleEmployment.be  This test is not yet approved or cleared by the [place] FDA and has been authorized for detection and/or diagnosis of SARS-CoV-2 by FDA under an Emergency Use Authorization (EUA). This EUA will remain in effect (meaning this test can be used) for the duration of the COVID-19 declaration under Section 564(b)(1) of the Act, 21 U.S.C. section 360bbb-3(b)(1), unless the authorization is terminated or revoked.  Performed at  [facility], [address] [place], [place] [address]   Culture, blood (Routine X 2) w Reflex to ID Panel     Status: None (Preliminary result)   Collection Time: 02/13/21  7:01 PM   Specimen: BLOOD  Result Value Ref Range Status   Specimen Description BLOOD RIGHT ANTECUBITAL  Final   Special Requests   Final    BOTTLES DRAWN AEROBIC AND ANAEROBIC Blood Culture adequate volume   Culture   Final  NO GROWTH 2 DAYS Performed at [facility], [address] [place], [place] [address]    Report Status PENDING  Incomplete  Culture, blood (Routine X 2) w Reflex to ID Panel     Status: None (Preliminary result)   Collection Time: 02/13/21  7:10 PM   Specimen: BLOOD  Result Value Ref Range Status   Specimen Description BLOOD BLOOD LEFT ARM  Final   Special Requests   Final    BOTTLES DRAWN AEROBIC AND ANAEROBIC Blood Culture adequate volume   Culture   Final    NO GROWTH 2 DAYS Performed at [facility], [address] [place], [place] [address]    Report Status PENDING  Incomplete         Radiology Studies: No results found.      Scheduled Meds: . carvedilol  6.25 mg Oral BID  . gabapentin  100 mg Oral QHS  . insulin aspart  0-9 Units Subcutaneous TID WC  . insulin detemir  10 Units Subcutaneous BID  . loratadine  10 mg Oral Daily  . multivitamin with minerals  1 tablet Oral Daily  . pantoprazole  40 mg Oral Daily  . Ensure Max Protein  11 oz Oral Daily  . rosuvastatin  40 mg Oral QHS  . sertraline  200 mg Oral Daily  . tamsulosin  0.4 mg Oral Daily   Continuous Infusions: . sodium chloride Stopped (02/13/21 2340)  . cefTRIAXone (ROCEPHIN)  IV 2 g (02/15/21 1958)  . vancomycin 1,500 mg (02/15/21 1523)     LOS: 3 days    Time spent: 30 minutes    Barb Merino, MD [facility] Pager [phone]

## 2021-02-16 NOTE — Progress Notes (Signed)
Telemetry made aware that patient has Pacemaker.

## 2021-02-17 ENCOUNTER — Encounter
Admission: EM | Disposition: A | Discharge status: Home / Self Care | Payer: Self-pay | Source: Home / Self Care | Attending: Internal Medicine

## 2021-02-17 ENCOUNTER — Inpatient Hospital Stay: Payer: Medicare Other

## 2021-02-17 ENCOUNTER — Telehealth: Payer: Self-pay | Admitting: *Deleted

## 2021-02-17 ENCOUNTER — Other Ambulatory Visit: Payer: Self-pay | Admitting: Radiology

## 2021-02-17 DIAGNOSIS — E11621 Type 2 diabetes mellitus with foot ulcer: Secondary | ICD-10-CM | POA: Diagnosis not present

## 2021-02-17 DIAGNOSIS — L97519 Non-pressure chronic ulcer of other part of right foot with unspecified severity: Secondary | ICD-10-CM | POA: Diagnosis not present

## 2021-02-17 DIAGNOSIS — E1142 Type 2 diabetes mellitus with diabetic polyneuropathy: Secondary | ICD-10-CM | POA: Diagnosis not present

## 2021-02-17 DIAGNOSIS — I70235 Atherosclerosis of native arteries of right leg with ulceration of other part of foot: Secondary | ICD-10-CM

## 2021-02-17 DIAGNOSIS — Z794 Long term (current) use of insulin: Secondary | ICD-10-CM | POA: Diagnosis not present

## 2021-02-17 HISTORY — PX: LOWER EXTREMITY ANGIOGRAPHY: CATH118251

## 2021-02-17 LAB — BASIC METABOLIC PANEL
Anion gap: 7 (ref 5–15)
BUN: 47 mg/dL — ABNORMAL HIGH (ref 8–23)
CO2: 21 mmol/L — ABNORMAL LOW (ref 22–32)
Calcium: 8.3 mg/dL — ABNORMAL LOW (ref 8.9–10.3)
Chloride: 109 mmol/L (ref 98–111)
Creatinine, Ser: 1.29 mg/dL — ABNORMAL HIGH (ref 0.61–1.24)
GFR, Estimated: 57 mL/min — ABNORMAL LOW (ref >60)
Glucose, Bld: 172 mg/dL — ABNORMAL HIGH (ref 70–99)
Potassium: 4.3 mmol/L (ref 3.5–5.1)
Sodium: 137 mmol/L (ref 135–145)

## 2021-02-17 LAB — GLUCOSE, CAPILLARY
Glucose-Capillary: 156 mg/dL — ABNORMAL HIGH (ref 70–99)
Glucose-Capillary: 167 mg/dL — ABNORMAL HIGH (ref 70–99)
Glucose-Capillary: 160 mg/dL — ABNORMAL HIGH (ref 70–99)
Glucose-Capillary: 210 mg/dL — ABNORMAL HIGH (ref 70–99)

## 2021-02-17 SURGERY — LOWER EXTREMITY ANGIOGRAPHY
Anesthesia: Moderate Sedation | Laterality: Right

## 2021-02-17 MED ORDER — NITROGLYCERIN 1 MG/10 ML FOR IR/CATH LAB
INTRA_ARTERIAL | Status: DC | PRN
  Administered 2021-02-17: 300 ug

## 2021-02-17 MED ORDER — TECHNETIUM TC 99M MEDRONATE IV KIT
23.6500 | PACK | Freq: Once | INTRAVENOUS | Status: AC | PRN
  Administered 2021-02-17: 12:00:00 23.65 via INTRAVENOUS

## 2021-02-17 MED ORDER — HEPARIN SODIUM (PORCINE) 1000 UNIT/ML IJ SOLN
INTRAMUSCULAR | Status: AC
  Administered 2021-02-17: 5000 [IU]
  Filled 2021-02-17: qty 1

## 2021-02-17 MED ORDER — MIDAZOLAM HCL 5 MG/5ML IJ SOLN
INTRAMUSCULAR | Status: AC
  Administered 2021-02-17: 2 mg
  Filled 2021-02-17: qty 5

## 2021-02-17 MED ORDER — HYDROCODONE-ACETAMINOPHEN 7.5-325 MG PO TABS
1.0000 | ORAL_TABLET | Freq: Four times a day (QID) | ORAL | Status: DC | PRN
  Administered 2021-02-18: 17:00:00 1 via ORAL
  Administered 2021-02-19: 2 via ORAL
  Administered 2021-02-19: 1 via ORAL
  Administered 2021-02-19 – 2021-02-22 (×5): 2 via ORAL
  Filled 2021-02-17: qty 1
  Filled 2021-02-17 (×7): qty 2

## 2021-02-17 MED ORDER — CARVEDILOL 6.25 MG PO TABS
12.5000 mg | ORAL_TABLET | Freq: Two times a day (BID) | ORAL | Status: DC
  Administered 2021-02-17 – 2021-02-22 (×11): 12.5 mg via ORAL
  Filled 2021-02-17 (×5): qty 2
  Filled 2021-02-17: qty 4
  Filled 2021-02-17 (×5): qty 2

## 2021-02-17 MED ORDER — FENTANYL CITRATE (PF) 100 MCG/2ML IJ SOLN
INTRAMUSCULAR | Status: AC
  Administered 2021-02-17: 50 ug
  Filled 2021-02-17: qty 2

## 2021-02-17 MED ORDER — CLOPIDOGREL BISULFATE 75 MG PO TABS
75.0000 mg | ORAL_TABLET | Freq: Every day | ORAL | Status: DC
  Administered 2021-02-19: 75 mg via ORAL
  Filled 2021-02-17 (×2): qty 1

## 2021-02-17 SURGICAL SUPPLY — 15 items
BALLN LUTONIX DCB 6X100X130 (BALLOONS) ×2
BALLN ULTRVRSE 018 2.5X100X150 (BALLOONS) ×2
BALLOON LUTONIX DCB 6X100X130 (BALLOONS) ×1 IMPLANT
BALLOON ULTRVS 018 2.5X100X150 (BALLOONS) ×1 IMPLANT
CATH ANGIO 5F PIGTAIL 65CM (CATHETERS) ×2 IMPLANT
CATH BEACON 5 .038 100 VERT TP (CATHETERS) ×2 IMPLANT
DEVICE STARCLOSE SE CLOSURE (Vascular Products) ×2 IMPLANT
GLIDEWIRE ADV .035X260CM (WIRE) ×2 IMPLANT
KIT ENCORE 26 ADVANTAGE (KITS) ×2 IMPLANT
PACK ANGIOGRAPHY (CUSTOM PROCEDURE TRAY) ×2 IMPLANT
SHEATH ANL2 6FRX45 HC (SHEATH) ×2 IMPLANT
SHEATH BRITE TIP 5FRX11 (SHEATH) ×2 IMPLANT
TUBING CONTRAST HIGH PRESS 72 (TUBING) ×2 IMPLANT
WIRE G V18X300CM (WIRE) ×2 IMPLANT
WIRE GUIDERIGHT .035X150 (WIRE) ×2 IMPLANT

## 2021-02-17 NOTE — Plan of Care (Signed)
  Problem: Clinical Measurements: Goal: Diagnostic test results will improve Outcome: Progressing   

## 2021-02-17 NOTE — Telephone Encounter (Signed)
Patients wife lvm that Creeden is in the hospital and needs to r/s his procedure.

## 2021-02-17 NOTE — Progress Notes (Signed)
ORTHOPAEDIC PROGRESS NOTE  REQUESTING PHYSICIAN: Barb Merino, MD  Chief Complaint: Right great toe ulcer  HPI: Nathan Singleton is a 80 y.o. male who presents resting bed comfortably.  Patient still states that he has some pain to his right big toe.  Patient is currently undergoing a bone scan and images are not yet seen on computer.  Patient also has set to undergo revascularization of right lower extremity today.  Past Medical History:  Diagnosis Date  . Aneurysm (arteriovenous) of coronary vessels   . Arthritis   . Ascending aortic aneurysm ([place])   . CAD (coronary artery disease)   . Diabetes mellitus without complication ([place])   . History of GI bleed   . Hyperlipidemia associated with type 2 diabetes mellitus ([place])   . Hypertension associated with diabetes ([place])   . Presence of permanent cardiac pacemaker   . PVD (peripheral vascular disease) ([place])   . S/P TAVR (transcatheter aortic valve replacement)    Past Surgical History:  Procedure Laterality Date  . Ankle repaired Right   . Carpal Tunnel repaired Left   . CATARACT EXTRACTION Bilateral   . COLONOSCOPY    . Copillar implant     . DG CHOLECYSTOGRAPHY GALL BLADDER ([facility]X)    . GASTRIC BYPASS    . heart valve replaced    . PACEMAKER GENERATOR CHANGE    . REPLACEMENT TOTAL KNEE BILATERAL    . RTC    . STENT PLACE LEFT URETER ([facility] HX) Right    Right Leg  . Toe nail removed Bilateral    Social History   Socioeconomic History  . Marital status: Single    Spouse name: Not on file  . Number of children: Not on file  . Years of education: Not on file  . Highest education level: Not on file  Occupational History  . Not on file  Tobacco Use  . Smoking status: Former Research scientist (life sciences)  . Smokeless tobacco: Never Used  Substance and Sexual Activity  . Alcohol use: Not Currently  . Drug use: Not Currently  . Sexual activity: Not on file  Other Topics Concern  . Not on file  Social History Narrative  . Not on file    Social Determinants of Health   Financial Resource Strain: Not on file  Food Insecurity: Not on file  Transportation Needs: Not on file  Physical Activity: Not on file  Stress: Not on file  Social Connections: Not on file   Family History  Problem Relation Age of Onset  . Heart disease Mother   . Alzheimer's disease Mother   . Heart disease Father   . Diabetes Father    Allergies  Allergen Reactions  . Atorvastatin Other (See Comments)    "bad for kidneys" Cramping   . Celecoxib Other (See Comments)    Other reaction(s): Other (See Comments) Kidney Problem Kidney Problem   . Dicyclomine Hcl Other (See Comments)    Stomach cramps  . Doxycycline Other (See Comments)  . Metformin Other (See Comments)   Prior to Admission medications   Medication Sig Start Date End Date Taking? Authorizing Provider  calcium citrate-vitamin D (CITRACAL+D) 315-200 MG-UNIT tablet Take 1 tablet by mouth daily. 11/11/20  Yes [provider]  D-5000 125 MCG (5000 UT) TABS Take 1 tablet by mouth daily. 11/11/20  Yes [provider]  insulin glargine (LANTUS) 100 UNIT/ML Solostar Pen Inject 25 Units into the skin at bedtime.   Yes [provider]  liraglutide ([place])  18 MG/3ML SOPN Inject 1.8 mg into the skin daily.   Yes [provider]  multivitamin (ONE-A-DAY MEN'S) TABS tablet Take 1 tablet by mouth daily. 11/11/20  Yes [provider]  allopurinol (ZYLOPRIM) 100 MG tablet Take 100 mg by mouth daily. 10/19/20   [provider]  amLODipine (NORVASC) 10 MG tablet Take 10 mg by mouth daily.    [provider]  baclofen (LIORESAL) 10 MG tablet Take 10 mg by mouth 2 (two) times daily. 02/12/21   [provider]  BAYER ASPIRIN EC LOW DOSE 81 MG EC tablet Take 81 mg by mouth daily. 11/11/20   [provider]  carvedilol (COREG) 6.25 MG tablet Take 6.25 mg by mouth 2 (two) times daily. 11/11/20   [provider]   cetirizine (ZYRTEC) 10 MG tablet Take 10 mg by mouth daily. 11/11/20   [provider]  diphenoxylate-atropine (LOMOTIL) 2.5-0.025 MG tablet Take 1 tablet by mouth every 6 (six) hours as needed for diarrhea or loose stools. 01/01/21   [provider]  docusate sodium (COLACE) 100 MG capsule Take 200 mg by mouth 2 (two) times daily. 11/11/20   [provider]  furosemide (LASIX) 20 MG tablet Take 20 mg by mouth daily. 10/19/20   [provider]  gabapentin (NEURONTIN) 100 MG capsule Take 100 mg by mouth daily. 01/29/21   [provider]  HYDROcodone-acetaminophen (NORCO) 7.5-325 MG tablet Take 1-2 tablets by mouth every 6 (six) hours as needed for moderate pain or severe pain. 11/22/20   Swayze, Ava, DO  isosorbide mononitrate (IMDUR) 30 MG 24 hr tablet Take 30 mg by mouth daily. 02/12/21   [provider]  LINZESS 145 MCG CAPS capsule Take 145 mcg by mouth daily. 11/11/20   [provider]  losartan (COZAAR) 25 MG tablet Take 25 mg by mouth daily. 02/13/21   [provider]  pantoprazole (PROTONIX) 40 MG tablet Take 1 tablet (40 mg total) by mouth daily. 02/11/21   [name], MD  rosuvastatin (CRESTOR) 40 MG tablet Take 40 mg by mouth daily. 02/13/21   [provider]  sertraline (ZOLOFT) 100 MG tablet Take 200 mg by mouth daily. 11/11/20   [provider]  tamsulosin (FLOMAX) 0.4 MG CAPS capsule Take 0.4 mg by mouth daily. 11/11/20   [provider]   No results found.  Positive ROS: All other systems have been reviewed and were otherwise negative with the exception of those mentioned in the HPI and as above.  12 point ROS was performed.  Physical Exam: General: Alert and oriented.  No apparent distress.  Vascular:  Left foot:Dorsalis Pedis:  absent Posterior Tibial:  absent  Right foot: Dorsalis Pedis:  absent Posterior Tibial:  absent  Neuro:absent protective sensation although he is complaining  of some pain with palpation to the right great toe  Derm: Ulceration on the medial aspect of the right great toe with some surrounding hyperkeratosis.  No lymphangitic streaking.  No obvious areas that probed to bone, minimal to no erythema or edema present to this area.  Ortho/MS: Mild pain on palpation right hallux.  Assessment: 1. Diabetic neuropathic ulceration right great toe 2. PVD  Plan: -Patient seen and examined. -Stable wound present to the right IPJ hallux plantarly.  Improvement seen in the erythema and edema overall to the right lower extremity. -Redressed with Betadine wet-to-dry dressing.  Change per orders. -Patient may ambulate with heel contact and surgical shoe. -Appreciate vascular recommendations.  Plan for  revascularization procedure today. -Bone scan in process currently. -Briefly discussed with patient in detail if bone scan does show infection present to bone at the hallux then patient will likely need at least a partial amputation of his big toe.  Patient understands.  We will follow-up with patient again tomorrow for further discussion of results of angiogram as well as bone scan. -Wound culture ordered.  Appreciate medicine recommendations for antibiotic therapy.  Patient has failed outpatient Keflex medication for 10 days.  Although believe that a lot of patient's redness and swelling could be related to his peripheral vascular disease rather than true cellulitis.  Patient's white blood cell count around 3% and patient is afebrile with vital signs stable.  Caroline More, DPM  02/17/2021 12:58 PM

## 2021-02-17 NOTE — Progress Notes (Signed)
[facility]  ROUNDING NOTE   Subjective:   Patient is seen resting in bed Currently NPO for procedure Denies nausea and shortness of breath   Objective:  Vital signs in last 24 hours:  Temp:  [98.2 F (36.8 C)-98.6 F (37 C)] 98.2 F (36.8 C) (03/07 0803) Pulse Rate:  [59-63] 60 (03/07 0803) Resp:  [14-20] 16 (03/07 0803) BP: (136-167)/(60-78) 167/67 (03/07 0803) SpO2:  [96 %-100 %] 97 % (03/07 0803) Weight:  [116.4 kg] 116.4 kg (03/07 0453)  Weight change:  Filed Weights   02/13/21 1133 02/14/21 0424 02/17/21 0453  Weight: 113.4 kg 115.2 kg 116.4 kg    Intake/Output: I/O last 3 completed shifts: In: 670 [P.O.:270; IV Piggyback:400] Out: -    Intake/Output this shift:  No intake/output data recorded.  Physical Exam: General: NAD, laying in bed  Head: Normocephalic, atraumatic. Moist oral mucosal membranes  Eyes: Anicteric  Neck: Supple, trachea midline  Lungs:  Clear to auscultation  Heart: Regular rate and rhythm  Abdomen:  Soft, nontender,   Extremities:  no peripheral edema.  Neurologic: Alert and oriented   Skin: No lesions. LLE redness       Basic Metabolic Panel: Recent Labs  Lab 02/13/21 1130 02/13/21 1136 02/14/21 0402 02/16/21 0420 02/17/21 0623  NA 138  --  140 139 137  K 3.7  --  3.8 4.2 4.3  CL 113*  --  115* 112* 109  CO2 18*  --  20* 22 21*  GLUCOSE 125*  --  187* 187* 172*  BUN 53*  --  40* 37* 47*  CREATININE 1.43*  --  1.28* 1.19 1.29*  CALCIUM 8.2*  --  8.1* 8.3* 8.3*  MG  --  2.0 2.1 2.0  --     Liver Function Tests: Recent Labs  Lab 02/13/21 1130 02/14/21 0402  AST 41 27  ALT 36 28  ALKPHOS 110 83  BILITOT 0.6 0.4  PROT 6.8 6.0*  ALBUMIN 3.5 3.0*   No results for input(s): LIPASE, AMYLASE in the last 168 hours. No results for input(s): AMMONIA in the last 168 hours.  CBC: Recent Labs  Lab 02/13/21 1130 02/14/21 0402 02/16/21 0420  WBC 3.2* 2.9* 4.3  NEUTROABS 2.2 1.7 3.0  HGB 8.7* 8.4* 9.1*   HCT 26.5* 26.1* 26.7*  MCV 94.6 94.9 93.4  PLT 100* 102* 103*    Cardiac Enzymes: No results for input(s): CKTOTAL, CKMB, CKMBINDEX, TROPONINI in the last 168 hours.  BNP: Invalid input(s): POCBNP  CBG: Recent Labs  Lab 02/16/21 0824 02/16/21 1201 02/16/21 1536 02/16/21 2024 02/17/21 0804  GLUCAP 166* 293* 243* 288* 156*    Microbiology: Results for orders placed or performed during the hospital encounter of 02/13/21  Resp Panel by RT-PCR (Flu A&B, Covid) Nasopharyngeal Swab     Status: None   Collection Time: 02/13/21  4:03 PM   Specimen: Nasopharyngeal Swab; Nasopharyngeal(NP) swabs in vial transport medium  Result Value Ref Range Status   SARS Coronavirus 2 by RT PCR NEGATIVE NEGATIVE Final    Comment: (NOTE) SARS-CoV-2 target nucleic acids are NOT DETECTED.  The SARS-CoV-2 RNA is generally detectable in upper respiratory specimens during the acute phase of infection. The lowest concentration of SARS-CoV-2 viral copies this assay can detect is 138 copies/mL. A negative result does not preclude SARS-Cov-2 infection and should not be used as the sole basis for treatment or other patient management decisions. A negative result may occur with  improper specimen collection/handling, submission of  specimen other than nasopharyngeal swab, presence of viral mutation(s) within the areas targeted by this assay, and inadequate number of viral copies(<138 copies/mL). A negative result must be combined with clinical observations, patient history, and epidemiological information. The expected result is Negative.  Fact Sheet for Patients:  EntrepreneurPulse.com.au  Fact Sheet for Healthcare Providers:  IncredibleEmployment.be  This test is no t yet approved or cleared by the [place] FDA and  has been authorized for detection and/or diagnosis of SARS-CoV-2 by FDA under an Emergency Use Authorization (EUA). This EUA will remain  in  effect (meaning this test can be used) for the duration of the COVID-19 declaration under Section 564(b)(1) of the Act, 21 U.S.C.section 360bbb-3(b)(1), unless the authorization is terminated  or revoked sooner.       Influenza A by PCR NEGATIVE NEGATIVE Final   Influenza B by PCR NEGATIVE NEGATIVE Final    Comment: (NOTE) The Xpert Xpress SARS-CoV-2/FLU/RSV plus assay is intended as an aid in the diagnosis of influenza from Nasopharyngeal swab specimens and should not be used as a sole basis for treatment. Nasal washings and aspirates are unacceptable for Xpert Xpress SARS-CoV-2/FLU/RSV testing.  Fact Sheet for Patients: EntrepreneurPulse.com.au  Fact Sheet for Healthcare Providers: IncredibleEmployment.be  This test is not yet approved or cleared by the [place] FDA and has been authorized for detection and/or diagnosis of SARS-CoV-2 by FDA under an Emergency Use Authorization (EUA). This EUA will remain in effect (meaning this test can be used) for the duration of the COVID-19 declaration under Section 564(b)(1) of the Act, 21 U.S.C. section 360bbb-3(b)(1), unless the authorization is terminated or revoked.  Performed at [facility], [address] [place], [place] [address]   Culture, blood (Routine X 2) w Reflex to ID Panel     Status: None (Preliminary result)   Collection Time: 02/13/21  7:01 PM   Specimen: BLOOD  Result Value Ref Range Status   Specimen Description BLOOD RIGHT ANTECUBITAL  Final   Special Requests   Final    BOTTLES DRAWN AEROBIC AND ANAEROBIC Blood Culture adequate volume   Culture   Final    NO GROWTH 4 DAYS Performed at [facility], [address], [place], [place] [address]    Report Status PENDING  Incomplete  Culture, blood (Routine X 2) w Reflex to ID Panel     Status: None (Preliminary result)   Collection Time: 02/13/21  7:10 PM   Specimen: BLOOD  Result Value Ref Range  Status   Specimen Description BLOOD BLOOD LEFT ARM  Final   Special Requests   Final    BOTTLES DRAWN AEROBIC AND ANAEROBIC Blood Culture adequate volume   Culture   Final    NO GROWTH 4 DAYS Performed at [facility], [address], [place], [place] [address]    Report Status PENDING  Incomplete    Coagulation Studies: No results for input(s): LABPROT, INR in the last 72 hours.  Urinalysis: No results for input(s): COLORURINE, LABSPEC, PHURINE, GLUCOSEU, HGBUR, BILIRUBINUR, KETONESUR, PROTEINUR, UROBILINOGEN, NITRITE, LEUKOCYTESUR in the last 72 hours.  Invalid input(s): APPERANCEUR    Imaging: No results found.   Medications:   . sodium chloride Stopped (02/13/21 2340)  . cefTRIAXone (ROCEPHIN)  IV 2 g (02/16/21 2043)   . allopurinol  100 mg Oral Daily  . amLODipine  10 mg Oral Daily  . aspirin EC  81 mg Oral Daily  . baclofen  10 mg Oral BID  . calcium carbonate  1 tablet Oral TID WC  . carvedilol  12.5 mg Oral BID  . gabapentin  100 mg Oral QHS  . insulin aspart  0-9 Units Subcutaneous TID WC  . insulin detemir  10 Units Subcutaneous BID  . isosorbide mononitrate  30 mg Oral Daily  . loratadine  10 mg Oral Daily  . multivitamin with minerals  1 tablet Oral Daily  . pantoprazole  40 mg Oral Daily  . polyethylene glycol  17 g Oral Daily  . Ensure Max Protein  11 oz Oral Daily  . rosuvastatin  40 mg Oral QHS  . senna-docusate  1 tablet Oral BID  . sertraline  200 mg Oral Daily  . tamsulosin  0.4 mg Oral Daily   sodium chloride, acetaminophen **OR** acetaminophen, fentaNYL (SUBLIMAZE) injection, oxyCODONE  Assessment/ Plan:  Mr. Nathan Singleton is a 80 y.o.  male with past medical history of type 2 diabetes,severe aortic stenosis status post TAVR,pacemaker,hypertension,and chronic anemia. He presented to the emergency room with complaints of progressive right foot pain.   1. Acute kidney injury on CKD stage IIIa  with baseline  creatinine1.07on12/7/21. -Creatinine on admission 1.43, current creatinine 1.29 improving  -Scheduled for angiogram today -Will monitor kidney function in the coming day -Sodium Bicarb 75 ml/hr for post-procedure recovery  2. Diabetes type 2 with CKD  - continue management per primary team   3. Infected right great toe  - podiatry following  - vascular planning for angiogram of right LE today.     LOS: 4   3/7/20229:45 AM

## 2021-02-17 NOTE — Progress Notes (Signed)
PROGRESS NOTE    Nathan Singleton  [id number] DOB: [birth year] DOA: 02/13/2021 PCP: Ranae Plumber, PA    Brief Narrative:  Patient with type 2 diabetes, severe aortic stenosis status post TAVR, SSS s/p pacemaker, hypertension, chronic anemia presented to the ER with increasing pain and swelling of the right foot especially right great toe.  Patient does have history of chronic right foot ulceration and recently worsening.  X-ray did not show osteomyelitis. Admitted with ischemic toe with ulceration.   Assessment & Plan:   Principal Problem:   Diabetic foot ulcer ([place]) Active Problems:   Hypertension   DM2 (diabetes mellitus, type 2) ([place])   Hyperlipidemia associated with type 2 diabetes mellitus ([place])   Anemia   BPH (benign prostatic hyperplasia)  Diabetic foot ulceration/diabetic toe with spreading cellulitis: Also likely ischemic ulcer. Presented with significant soft tissue swelling and erythema with ischemic ulcer on the plantar aspect of the great toe.  Treated with vancomycin Rocephin and Flagyl.  Significant improvement on the surrounding cellulitis.   Flagyl discontinued 3/5  Vancomycin discontinued 3/7  Continue Rocephin until surgical decision.   His pacemaker is compatible to MRI, however logistically not possible at [facility].   Bone scan today to decide about surgical debridement to see whether bone is affected or not.  Cultures negative so far.   Seen by vascular surgery, plan for bilateral lower extremity angiogram today and possible intervention.    Sick sinus syndrome: s/p pacemaker placement.  MRI compatible. Interrogated with some palpitations at night.  Patient complains of intermittent palpitations.  Telemetry shows PVCs.   We will increase dose of Coreg to 12.5 twice a day.  He has a pacemaker which is functioning, shows paced rhythm at times.    Type 2 diabetes: A1c 7.5.  On insulin and Victoza at home.  Continue.  Resumed long-acting  insulin.  Hyperlipidemia: Stable on statin.  Tolerating.  CKD stage IIIa: Renal functions at about baseline.  Followed by nephrology to prepare for angiogram.  Temporizing measures for angiogram.   DVT prophylaxis: SCDs Start: 02/13/21 1800   Code Status: Full code. MOST form introduced and given to the patient. Family Communication: None today. Disposition Plan: Status is: Inpatient  Remains inpatient appropriate because:IV treatments appropriate due to intensity of illness or inability to take PO and Inpatient level of care appropriate due to severity of illness   Dispo: The patient is from: Home              Anticipated d/c is to: Home              Patient currently is not medically stable to d/c.   Difficult to place patient No         Consultants:   Vascular surgery  Podiatry surgery  Nephrology  Procedures:   None  Antimicrobials:  Antibiotics Given (last 72 hours)    Date/Time Action Medication Dose Rate   02/14/21 1302 Given   metroNIDAZOLE (FLAGYL) tablet 500 mg 500 mg    02/14/21 1512 New Bag/Given   vancomycin (VANCOREADY) IVPB 1500 mg/300 mL 1,500 mg 150 mL/hr   02/14/21 2032 New Bag/Given   cefTRIAXone (ROCEPHIN) 2 g in sodium chloride 0.9 % 100 mL IVPB 2 g 200 mL/hr   02/14/21 2149 Given   metroNIDAZOLE (FLAGYL) tablet 500 mg 500 mg    02/15/21 0531 Given   metroNIDAZOLE (FLAGYL) tablet 500 mg 500 mg    02/15/21 1523 New Bag/Given   vancomycin (VANCOREADY) IVPB 1500 mg/300 mL  1,500 mg 150 mL/hr   02/15/21 1958 New Bag/Given   cefTRIAXone (ROCEPHIN) 2 g in sodium chloride 0.9 % 100 mL IVPB 2 g 200 mL/hr   02/16/21 1627 New Bag/Given   vancomycin (VANCOREADY) IVPB 1500 mg/300 mL 1,500 mg 150 mL/hr   02/16/21 2043 New Bag/Given   cefTRIAXone (ROCEPHIN) 2 g in sodium chloride 0.9 % 100 mL IVPB 2 g 200 mL/hr         Subjective: Patient seen and examined.  Looking forward for procedure.  Today without any complaints. Telemetry reviewed, it  shows paced rhythm at times along with increasing numbers of PVCs.  Objective: Vitals:   02/16/21 2027 02/17/21 0013 02/17/21 0453 02/17/21 0803  BP: 140/65 136/60 (!) 156/74 (!) 167/67  Pulse: 63 60 (!) 59 60  Resp: '16 16 15 16  '$ Temp: 98.4 F (36.9 C) 98.6 F (37 C) 98.4 F (36.9 C) 98.2 F (36.8 C)  TempSrc: Oral Oral    SpO2: 98% 96% 98% 97%  Weight:   116.4 kg   Height:        Intake/Output Summary (Last 24 hours) at 02/17/2021 0944 Last data filed at 02/16/2021 2200 Gross per 24 hour  Intake 670 ml  Output --  Net 670 ml   Filed Weights   02/13/21 1133 02/14/21 0424 02/17/21 0453  Weight: 113.4 kg 115.2 kg 116.4 kg    Examination:  General exam: Appears calm and comfortable.  Pleasant to conversation with hearing aid. Respiratory system: Clear to auscultation. Respiratory effort normal. Cardiovascular system: S1 & S2 heard, regular rate rhythm.  Pacemaker present.  Gastrointestinal system: Abdomen is nondistended, soft and nontender. No organomegaly or masses felt. Normal bowel sounds heard. Central nervous system: Alert and oriented. No focal neurological deficits. Extremities: Right great toe with mildly tender ulcer on plantar aspect.  Surrounding swelling present but no erythema or abscess. Left foot with some scratches. feeble peripheral pulses.    Data Reviewed: I have personally reviewed following labs and imaging studies  CBC: Recent Labs  Lab 02/13/21 1130 02/14/21 0402 02/16/21 0420  WBC 3.2* 2.9* 4.3  NEUTROABS 2.2 1.7 3.0  HGB 8.7* 8.4* 9.1*  HCT 26.5* 26.1* 26.7*  MCV 94.6 94.9 93.4  PLT 100* 102* [id number]*   Basic Metabolic Panel: Recent Labs  Lab 02/13/21 1130 02/13/21 1136 02/14/21 0402 02/16/21 0420 02/17/21 0623  NA 138  --  140 139 137  K 3.7  --  3.8 4.2 4.3  CL 113*  --  115* 112* 109  CO2 18*  --  20* 22 21*  GLUCOSE 125*  --  187* 187* 172*  BUN 53*  --  40* 37* 47*  CREATININE 1.43*  --  1.28* 1.19 1.29*  CALCIUM 8.2*  --   8.1* 8.3* 8.3*  MG  --  2.0 2.1 2.0  --    GFR: Estimated Creatinine Clearance: 62.1 mL/min (A) (by C-G formula based on SCr of 1.29 mg/dL (H)). Liver Function Tests: Recent Labs  Lab 02/13/21 1130 02/14/21 0402  AST 41 27  ALT 36 28  ALKPHOS 110 83  BILITOT 0.6 0.4  PROT 6.8 6.0*  ALBUMIN 3.5 3.0*   No results for input(s): LIPASE, AMYLASE in the last 168 hours. No results for input(s): AMMONIA in the last 168 hours. Coagulation Profile: Recent Labs  Lab 02/14/21 0402  INR 1.3*   Cardiac Enzymes: No results for input(s): CKTOTAL, CKMB, CKMBINDEX, TROPONINI in the last 168 hours. BNP (last 3 results) No  results for input(s): PROBNP in the last 8760 hours. HbA1C: No results for input(s): HGBA1C in the last 72 hours. CBG: Recent Labs  Lab 02/16/21 0824 02/16/21 1201 02/16/21 1536 02/16/21 2024 02/17/21 0804  GLUCAP 166* 293* 243* 288* 156*   Lipid Profile: No results for input(s): CHOL, HDL, LDLCALC, TRIG, CHOLHDL, LDLDIRECT in the last 72 hours. Thyroid Function Tests: No results for input(s): TSH, T4TOTAL, FREET4, T3FREE, THYROIDAB in the last 72 hours. Anemia Panel: No results for input(s): VITAMINB12, FOLATE, FERRITIN, TIBC, IRON, RETICCTPCT in the last 72 hours. Sepsis Labs: Recent Labs  Lab 02/13/21 1130 02/14/21 0402  LATICACIDVEN 1.1 1.1    Recent Results (from the past 240 hour(s))  Resp Panel by RT-PCR (Flu A&B, Covid) Nasopharyngeal Swab     Status: None   Collection Time: 02/13/21  4:03 PM   Specimen: Nasopharyngeal Swab; Nasopharyngeal(NP) swabs in vial transport medium  Result Value Ref Range Status   SARS Coronavirus 2 by RT PCR NEGATIVE NEGATIVE Final    Comment: (NOTE) SARS-CoV-2 target nucleic acids are NOT DETECTED.  The SARS-CoV-2 RNA is generally detectable in upper respiratory specimens during the acute phase of infection. The lowest concentration of SARS-CoV-2 viral copies this assay can detect is 138 copies/mL. A negative result  does not preclude SARS-Cov-2 infection and should not be used as the sole basis for treatment or other patient management decisions. A negative result may occur with  improper specimen collection/handling, submission of specimen other than nasopharyngeal swab, presence of viral mutation(s) within the areas targeted by this assay, and inadequate number of viral copies(<138 copies/mL). A negative result must be combined with clinical observations, patient history, and epidemiological information. The expected result is Negative.  Fact Sheet for Patients:  EntrepreneurPulse.com.au  Fact Sheet for Healthcare Providers:  IncredibleEmployment.be  This test is no t yet approved or cleared by the [place] FDA and  has been authorized for detection and/or diagnosis of SARS-CoV-2 by FDA under an Emergency Use Authorization (EUA). This EUA will remain  in effect (meaning this test can be used) for the duration of the COVID-19 declaration under Section 564(b)(1) of the Act, 21 U.S.C.section 360bbb-3(b)(1), unless the authorization is terminated  or revoked sooner.       Influenza A by PCR NEGATIVE NEGATIVE Final   Influenza B by PCR NEGATIVE NEGATIVE Final    Comment: (NOTE) The Xpert Xpress SARS-CoV-2/FLU/RSV plus assay is intended as an aid in the diagnosis of influenza from Nasopharyngeal swab specimens and should not be used as a sole basis for treatment. Nasal washings and aspirates are unacceptable for Xpert Xpress SARS-CoV-2/FLU/RSV testing.  Fact Sheet for Patients: EntrepreneurPulse.com.au  Fact Sheet for Healthcare Providers: IncredibleEmployment.be  This test is not yet approved or cleared by the [place] FDA and has been authorized for detection and/or diagnosis of SARS-CoV-2 by FDA under an Emergency Use Authorization (EUA). This EUA will remain in effect (meaning this test can be used) for  the duration of the COVID-19 declaration under Section 564(b)(1) of the Act, 21 U.S.C. section 360bbb-3(b)(1), unless the authorization is terminated or revoked.  Performed at [facility], [address] [place], [place] [address]   Culture, blood (Routine X 2) w Reflex to ID Panel     Status: None (Preliminary result)   Collection Time: 02/13/21  7:01 PM   Specimen: BLOOD  Result Value Ref Range Status   Specimen Description BLOOD RIGHT ANTECUBITAL  Final   Special Requests   Final  BOTTLES DRAWN AEROBIC AND ANAEROBIC Blood Culture adequate volume   Culture   Final    NO GROWTH 4 DAYS Performed at [facility], [address] [place], [place] [address]    Report Status PENDING  Incomplete  Culture, blood (Routine X 2) w Reflex to ID Panel     Status: None (Preliminary result)   Collection Time: 02/13/21  7:10 PM   Specimen: BLOOD  Result Value Ref Range Status   Specimen Description BLOOD BLOOD LEFT ARM  Final   Special Requests   Final    BOTTLES DRAWN AEROBIC AND ANAEROBIC Blood Culture adequate volume   Culture   Final    NO GROWTH 4 DAYS Performed at [facility], [address], [place], [place] [address]    Report Status PENDING  Incomplete         Radiology Studies: No results found.      Scheduled Meds: . allopurinol  100 mg Oral Daily  . amLODipine  10 mg Oral Daily  . aspirin EC  81 mg Oral Daily  . baclofen  10 mg Oral BID  . calcium carbonate  1 tablet Oral TID WC  . carvedilol  12.5 mg Oral BID  . gabapentin  100 mg Oral QHS  . insulin aspart  0-9 Units Subcutaneous TID WC  . insulin detemir  10 Units Subcutaneous BID  . isosorbide mononitrate  30 mg Oral Daily  . loratadine  10 mg Oral Daily  . multivitamin with minerals  1 tablet Oral Daily  . pantoprazole  40 mg Oral Daily  . polyethylene glycol  17 g Oral Daily  . Ensure Max Protein  11 oz Oral Daily  . rosuvastatin  40 mg Oral QHS  . senna-docusate  1  tablet Oral BID  . sertraline  200 mg Oral Daily  . tamsulosin  0.4 mg Oral Daily   Continuous Infusions: . sodium chloride Stopped (02/13/21 2340)  . cefTRIAXone (ROCEPHIN)  IV 2 g (02/16/21 2043)     LOS: 4 days    Time spent: 30 minutes    Barb Merino, MD [facility] Pager [phone]

## 2021-02-17 NOTE — Op Note (Signed)
[facility]  Percutaneous Study/Intervention Procedural Note   Date of Surgery: 02/17/2021  Surgeon(s):Zahlia Deshazer    Assistants:none  Pre-operative Diagnosis: PAD with ulceration RLE  Post-operative diagnosis:  Same  Procedure(s) Performed:             1.  Ultrasound guidance for vascular access left femoral artery             2.  Catheter placement into right SFA from left femoral approach             3.  Aortogram and selective right lower extremity angiogram             4.  Percutaneous transluminal angioplasty of right SFA with 6 mm diameter by 10 cm length Lutonix drug-coated angioplasty balloon             5.   Percutaneous transluminal angioplasty of the right dorsalis pedis artery and distal anterior tibial artery with 2.5 mm diameter by 8 cm length angioplasty balloon  6.   StarClose closure device left femoral artery  EBL: 10 cc  Contrast: 45 cc  Fluoro Time: 4.3 minutes  Moderate Conscious Sedation Time: approximately 35 minutes using 2 mg of Versed and 50 mcg of Fentanyl              Indications:  Patient is a 80 y.o.male with a nonhealing ulceration of the right great toe. The patient is brought in for angiography for further evaluation and potential treatment.  Due to the limb threatening nature of the situation, angiogram was performed for attempted limb salvage. The patient is aware that if the procedure fails, amputation would be expected.  The patient also understands that even with successful revascularization, amputation may still be required due to the severity of the situation.  Risks and benefits are discussed and informed consent is obtained.   Procedure:  The patient was identified and appropriate procedural time out was performed.  The patient was then placed supine on the table and prepped and draped in the usual sterile fashion. Moderate conscious sedation was administered during a face to face encounter with the patient throughout the  procedure with my supervision of the RN administering medicines and monitoring the patient's vital signs, pulse oximetry, telemetry and mental status throughout from the start of the procedure until the patient was taken to the recovery room. Ultrasound was used to evaluate the left common femoral artery.  It was patent .  A digital ultrasound image was acquired.  A Seldinger needle was used to access the left common femoral artery under direct ultrasound guidance and a permanent image was performed.  A 0.035 J wire was advanced without resistance and a 5Fr sheath was placed.  Pigtail catheter was placed into the aorta and an AP aortogram was performed. This demonstrated normal renal arteries and normal aorta and iliac segments without significant stenosis. I then crossed the aortic bifurcation and advanced to the right femoral head and then into the proximal right SFA to help opacify distally. Selective right lower extremity angiogram was then performed. This demonstrated a normal common femoral artery, profunda femoris artery, and proximal to mid superficial femoral artery.  In the distal superficial femoral artery there is a highly calcific stenosis over a relatively short segment of 3 to 4 cm with some mild disease above and below this.  This appeared to be in the 75% range at its most severe segment.  Behind the knee that was originally not well seen  until we frog-leg his knee due to his knee replacement, there was a popliteal stent that was patent.  The remainder the popliteal artery was also patent.  There was then a normal tibial trifurcation.  The anterior tibial artery was patent until the ankle where there was a high-grade stenosis in the dorsalis pedis artery over several centimeters.  This was greater than 80%.  The peroneal artery was patent without focal stenosis.  The posterior tibial artery was chronically occluded. It was felt that it was in the patient's best interest to proceed with intervention  after these images to avoid a second procedure and a larger amount of contrast and fluoroscopy based off of the findings from the initial angiogram. The patient was systemically heparinized and a 6 [place] Ansell sheath was then placed over the [employer] wire. I then used a Kumpe catheter and the advantage wire to navigate through the stenosis in the SFA and down into the proximal anterior tibial artery.  I then exchanged for a 0.018 wire and use this to cross the stenosis in the distal anterior tibial artery and dorsalis pedis artery and parked the wire in the distal metatarsal area.  I then performed angioplasty of both diseased sites.  A 6 mm diameter by 10 cm length Lutonix drug-coated angioplasty balloon was inflated to 14 atm for 1 minute in the distal SFA.  Completion imaging showed about a 10% residual stenosis which was nonflow limiting.  A 2.5 mm diameter by 8 cm length angioplasty balloon was then used to treat the dorsalis pedis lesion.  This was inflated to 12 atm for 1 minute.  Completion imaging showed only about a 15 to 20% residual stenosis in the dorsalis pedis artery. I elected to terminate the procedure. The sheath was removed and StarClose closure device was deployed in the left femoral artery with excellent hemostatic result. The patient was taken to the recovery room in stable condition having tolerated the procedure well.  Findings:               Aortogram:  Renal arteries appeared normal, the aorta and iliac arteries were tortuous but widely patent with no significant stenosis.             Right lower Extremity:  Normal common femoral artery, profunda femoris artery, and proximal to mid superficial femoral artery.  In the distal superficial femoral artery there is a highly calcific stenosis over a relatively short segment of 3 to 4 cm with some mild disease above and below this.  This appeared to be in the 75% range at its most severe segment. 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  The remainder the popliteal artery was also patent. There was then a normal tibial trifurcation.  The anterior tibial artery was patent until the ankle where there was a high-grade stenosis in the dorsalis pedis artery over several centimeters.  This was greater than 80%.  The peroneal artery was patent without focal stenosis.  The posterior tibial artery was chronically occluded.   Disposition: Patient was taken to the recovery room in stable condition having tolerated the procedure well.  Complications: None  Leotis Pain 02/17/2021 5:35 PM   This note was created with Dragon Medical transcription system. Any errors in dictation are purely unintentional.

## 2021-02-18 ENCOUNTER — Inpatient Hospital Stay: Payer: Medicare Other | Admitting: Certified Registered Nurse Anesthetist

## 2021-02-18 ENCOUNTER — Encounter
Admission: EM | Disposition: A | Discharge status: Home / Self Care | Payer: Self-pay | Source: Home / Self Care | Attending: Internal Medicine

## 2021-02-18 ENCOUNTER — Encounter: Payer: Self-pay | Admitting: Vascular Surgery

## 2021-02-18 ENCOUNTER — Inpatient Hospital Stay: Payer: Medicare Other

## 2021-02-18 DIAGNOSIS — E11621 Type 2 diabetes mellitus with foot ulcer: Secondary | ICD-10-CM | POA: Diagnosis not present

## 2021-02-18 DIAGNOSIS — Z794 Long term (current) use of insulin: Secondary | ICD-10-CM | POA: Diagnosis not present

## 2021-02-18 DIAGNOSIS — E1142 Type 2 diabetes mellitus with diabetic polyneuropathy: Secondary | ICD-10-CM | POA: Diagnosis not present

## 2021-02-18 DIAGNOSIS — L97519 Non-pressure chronic ulcer of other part of right foot with unspecified severity: Secondary | ICD-10-CM | POA: Diagnosis not present

## 2021-02-18 HISTORY — PX: IRRIGATION AND DEBRIDEMENT FOOT: SHX6602

## 2021-02-18 LAB — BASIC METABOLIC PANEL
Anion gap: 6 (ref 5–15)
BUN: 43 mg/dL — ABNORMAL HIGH (ref 8–23)
CO2: 24 mmol/L (ref 22–32)
Calcium: 8.3 mg/dL — ABNORMAL LOW (ref 8.9–10.3)
Chloride: 108 mmol/L (ref 98–111)
Creatinine, Ser: 1.29 mg/dL — ABNORMAL HIGH (ref 0.61–1.24)
GFR, Estimated: 57 mL/min — ABNORMAL LOW (ref >60)
Glucose, Bld: 348 mg/dL — ABNORMAL HIGH (ref 70–99)
Potassium: 5.1 mmol/L (ref 3.5–5.1)
Sodium: 138 mmol/L (ref 135–145)

## 2021-02-18 LAB — CULTURE, BLOOD (ROUTINE X 2)
Culture: NO GROWTH
Culture: NO GROWTH
Special Requests: ADEQUATE
Special Requests: ADEQUATE

## 2021-02-18 LAB — CBC WITH DIFFERENTIAL/PLATELET
Abs Immature Granulocytes: 0.01 10*3/uL (ref 0.00–0.07)
Basophils Absolute: 0 10*3/uL (ref 0.0–0.1)
Basophils Relative: 1 %
Eosinophils Absolute: 0.1 10*3/uL (ref 0.0–0.5)
Eosinophils Relative: 3 %
HCT: 27.2 % — ABNORMAL LOW (ref 39.0–52.0)
Hemoglobin: 8.7 g/dL — ABNORMAL LOW (ref 13.0–17.0)
Immature Granulocytes: 0 %
Lymphocytes Relative: 13 %
Lymphs Abs: 0.5 10*3/uL — ABNORMAL LOW (ref 0.7–4.0)
MCH: 31 pg (ref 26.0–34.0)
MCHC: 32 g/dL (ref 30.0–36.0)
MCV: 96.8 fL (ref 80.0–100.0)
Monocytes Absolute: 0.2 10*3/uL (ref 0.1–1.0)
Monocytes Relative: 7 %
Neutro Abs: 2.7 10*3/uL (ref 1.7–7.7)
Neutrophils Relative %: 76 %
Platelets: 108 10*3/uL — ABNORMAL LOW (ref 150–400)
RBC: 2.81 MIL/uL — ABNORMAL LOW (ref 4.22–5.81)
RDW: 13.2 % (ref 11.5–15.5)
WBC: 3.5 10*3/uL — ABNORMAL LOW (ref 4.0–10.5)
nRBC: 0 % (ref 0.0–0.2)

## 2021-02-18 LAB — GLUCOSE, CAPILLARY
Glucose-Capillary: 276 mg/dL — ABNORMAL HIGH (ref 70–99)
Glucose-Capillary: 207 mg/dL — ABNORMAL HIGH (ref 70–99)
Glucose-Capillary: 184 mg/dL — ABNORMAL HIGH (ref 70–99)
Glucose-Capillary: 218 mg/dL — ABNORMAL HIGH (ref 70–99)
Glucose-Capillary: 286 mg/dL — ABNORMAL HIGH (ref 70–99)

## 2021-02-18 LAB — VANCOMYCIN, RANDOM: Vancomycin Rm: 13

## 2021-02-18 SURGERY — IRRIGATION AND DEBRIDEMENT FOOT
Anesthesia: General | Laterality: Right

## 2021-02-18 MED ORDER — FENTANYL CITRATE (PF) 100 MCG/2ML IJ SOLN
INTRAMUSCULAR | Status: AC
  Filled 2021-02-18: qty 2

## 2021-02-18 MED ORDER — BUPIVACAINE HCL 0.5 % IJ SOLN
INTRAMUSCULAR | Status: DC | PRN
  Administered 2021-02-18: 5 mL

## 2021-02-18 MED ORDER — LIDOCAINE HCL (CARDIAC) PF 100 MG/5ML IV SOSY
PREFILLED_SYRINGE | INTRAVENOUS | Status: DC | PRN
  Administered 2021-02-18: 60 mg via INTRAVENOUS

## 2021-02-18 MED ORDER — VANCOMYCIN HCL 1250 MG/250ML IV SOLN
1250.0000 mg | INTRAVENOUS | Status: DC
  Administered 2021-02-18 – 2021-02-20 (×3): 1250 mg via INTRAVENOUS
  Filled 2021-02-18 (×4): qty 250

## 2021-02-18 MED ORDER — CHLORHEXIDINE GLUCONATE 4 % EX LIQD
60.0000 mL | Freq: Once | CUTANEOUS | Status: AC
  Administered 2021-02-18: 4 via TOPICAL

## 2021-02-18 MED ORDER — POVIDONE-IODINE 10 % EX SWAB: 2.0000 "application " | Freq: Once | CUTANEOUS | Status: DC

## 2021-02-18 MED ORDER — LIDOCAINE HCL 1 % IJ SOLN
INTRAMUSCULAR | Status: DC | PRN
  Administered 2021-02-18: 5 mL

## 2021-02-18 MED ORDER — FENTANYL CITRATE (PF) 100 MCG/2ML IJ SOLN: 25.0000 ug | INTRAMUSCULAR | Status: DC | PRN

## 2021-02-18 MED ORDER — ONDANSETRON HCL 4 MG/2ML IJ SOLN: 4.0000 mg | Freq: Once | INTRAMUSCULAR | Status: DC | PRN

## 2021-02-18 MED ORDER — PROPOFOL 500 MG/50ML IV EMUL
INTRAVENOUS | Status: DC | PRN
  Administered 2021-02-18: 50 ug/kg/min via INTRAVENOUS

## 2021-02-18 MED ORDER — FENTANYL CITRATE (PF) 100 MCG/2ML IJ SOLN
INTRAMUSCULAR | Status: DC | PRN
  Administered 2021-02-18: 25 ug via INTRAVENOUS

## 2021-02-18 MED ORDER — PROPOFOL 500 MG/50ML IV EMUL
INTRAVENOUS | Status: AC
  Filled 2021-02-18: qty 50

## 2021-02-18 MED ORDER — PROPOFOL 10 MG/ML IV BOLUS
INTRAVENOUS | Status: DC | PRN
  Administered 2021-02-18: 30 mg via INTRAVENOUS

## 2021-02-18 MED ORDER — LIDOCAINE HCL (PF) 2 % IJ SOLN
INTRAMUSCULAR | Status: AC
  Filled 2021-02-18: qty 5

## 2021-02-18 SURGICAL SUPPLY — 60 items
BLADE OSC/SAGITTAL MD 5.5X18 (BLADE) ×2 IMPLANT
BLADE OSCILLATING/SAGITTAL (BLADE)
BLADE SW THK.38XMED LNG THN (BLADE) IMPLANT
BNDG COHESIVE 4X5 TAN STRL (GAUZE/BANDAGES/DRESSINGS) ×2 IMPLANT
BNDG COHESIVE 6X5 TAN STRL LF (GAUZE/BANDAGES/DRESSINGS) ×2 IMPLANT
BNDG CONFORM 2 STRL LF (GAUZE/BANDAGES/DRESSINGS) ×2 IMPLANT
BNDG CONFORM 3 STRL LF (GAUZE/BANDAGES/DRESSINGS) ×2 IMPLANT
BNDG ELASTIC 4X5.8 VLCR STR LF (GAUZE/BANDAGES/DRESSINGS) ×2 IMPLANT
BNDG ESMARK 4X12 TAN STRL LF (GAUZE/BANDAGES/DRESSINGS) ×2 IMPLANT
BNDG GAUZE 4.5X4.1 6PLY STRL (MISCELLANEOUS) ×2 IMPLANT
CANISTER SUCT 1200ML W/VALVE (MISCELLANEOUS) ×2 IMPLANT
CANISTER SUCT 3000ML PPV (MISCELLANEOUS) ×2 IMPLANT
COVER WAND RF STERILE (DRAPES) ×2 IMPLANT
CUFF TOURN SGL QUICK 12 (TOURNIQUET CUFF) IMPLANT
CUFF TOURN SGL QUICK 18X4 (TOURNIQUET CUFF) IMPLANT
DRAPE FLUOR MINI C-ARM 54X84 (DRAPES) ×2 IMPLANT
DRAPE XRAY CASSETTE 23X24 (DRAPES) IMPLANT
DRSG MEPILEX FLEX 3X3 (GAUZE/BANDAGES/DRESSINGS) IMPLANT
DURAPREP 26ML APPLICATOR (WOUND CARE) ×2 IMPLANT
ELECT REM PT RETURN 9FT ADLT (ELECTROSURGICAL) ×2
ELECTRODE REM PT RTRN 9FT ADLT (ELECTROSURGICAL) ×1 IMPLANT
GAUZE PACKING 1/4 X5 YD (GAUZE/BANDAGES/DRESSINGS) ×2 IMPLANT
GAUZE PACKING IODOFORM 1X5 (PACKING) ×2 IMPLANT
GAUZE SPONGE 4X4 12PLY STRL (GAUZE/BANDAGES/DRESSINGS) ×2 IMPLANT
GAUZE XEROFORM 1X8 LF (GAUZE/BANDAGES/DRESSINGS) ×2 IMPLANT
GLOVE INDICATOR 8.0 STRL GRN (GLOVE) ×2 IMPLANT
GLOVE SURG ENC MOIS LTX SZ7.5 (GLOVE) ×2 IMPLANT
GOWN STRL REUS W/ TWL XL LVL3 (GOWN DISPOSABLE) ×2 IMPLANT
GOWN STRL REUS W/TWL MED LVL3 (GOWN DISPOSABLE) ×4 IMPLANT
GOWN STRL REUS W/TWL XL LVL3 (GOWN DISPOSABLE) ×2
HANDPIECE VERSAJET DEBRIDEMENT (MISCELLANEOUS) IMPLANT
IV NS 1000ML (IV SOLUTION) ×1
IV NS 1000ML BAXH (IV SOLUTION) ×1 IMPLANT
KIT TURNOVER KIT A (KITS) ×2 IMPLANT
LABEL OR SOLS (LABEL) ×2 IMPLANT
MANIFOLD NEPTUNE II (INSTRUMENTS) ×2 IMPLANT
NEEDLE FILTER BLUNT 18X 1/2SAF (NEEDLE) ×1
NEEDLE FILTER BLUNT 18X1 1/2 (NEEDLE) ×1 IMPLANT
NEEDLE HYPO 25X1 1.5 SAFETY (NEEDLE) ×2 IMPLANT
NS IRRIG 500ML POUR BTL (IV SOLUTION) ×2 IMPLANT
PACK EXTREMITY ARMC (MISCELLANEOUS) ×2 IMPLANT
PAD ABD DERMACEA PRESS 5X9 (GAUZE/BANDAGES/DRESSINGS) ×2 IMPLANT
PULSAVAC PLUS IRRIG FAN TIP (DISPOSABLE) ×2
RASP SM TEAR CROSS CUT (RASP) IMPLANT
SHIELD FULL FACE ANTIFOG 7M (MISCELLANEOUS) ×2 IMPLANT
SOL .9 NS 3000ML IRR  AL (IV SOLUTION) ×1
SOL .9 NS 3000ML IRR UROMATIC (IV SOLUTION) ×1 IMPLANT
SOL PREP PVP 2OZ (MISCELLANEOUS) ×2
SOLUTION PREP PVP 2OZ (MISCELLANEOUS) ×1 IMPLANT
STOCKINETTE IMPERVIOUS 9X36 MD (GAUZE/BANDAGES/DRESSINGS) ×2 IMPLANT
SUT ETHILON 2 0 FS 18 (SUTURE) ×4 IMPLANT
SUT ETHILON 4-0 (SUTURE) ×1
SUT ETHILON 4-0 FS2 18XMFL BLK (SUTURE) ×1
SUT VIC AB 3-0 SH 27 (SUTURE) ×1
SUT VIC AB 3-0 SH 27X BRD (SUTURE) ×1 IMPLANT
SUT VIC AB 4-0 FS2 27 (SUTURE) ×2 IMPLANT
SUTURE ETHLN 4-0 FS2 18XMF BLK (SUTURE) ×1 IMPLANT
SWAB CULTURE AMIES ANAERIB BLU (MISCELLANEOUS) IMPLANT
SYR 10ML LL (SYRINGE) ×4 IMPLANT
TIP FAN IRRIG PULSAVAC PLUS (DISPOSABLE) ×1 IMPLANT

## 2021-02-18 NOTE — Progress Notes (Signed)
Pt to OR in stable condition

## 2021-02-18 NOTE — Telephone Encounter (Signed)
Spoke with pt's wife and was able to reschedule pt's procedure to 03-11-21.

## 2021-02-18 NOTE — Progress Notes (Signed)
[facility]  ROUNDING NOTE   Subjective:   Patient is seen resting in bed Currently NPO for procedure Says they are going to try and get his toe cleaned out  UOP 2.4L  Objective:  Vital signs in last 24 hours:  Temp:  [98.2 F (36.8 C)-99.1 F (37.3 C)] 98.9 F (37.2 C) (03/08 0808) Pulse Rate:  [57-66] 63 (03/08 0808) Resp:  [15-20] 18 (03/08 0808) BP: (141-158)/(68-81) 142/68 (03/08 0808) SpO2:  [93 %-99 %] 97 % (03/08 0808) Weight:  [111.3 kg-112.9 kg] 111.3 kg (03/08 0535)  Weight change: -3.447 kg Filed Weights   02/17/21 0453 02/17/21 1605 02/18/21 0535  Weight: 116.4 kg 112.9 kg 111.3 kg    Intake/Output: I/O last 3 completed shifts: In: 1460.2 [P.O.:1110; I.V.:51.2; IV Piggyback:299] Out: 2400 [Urine:2400]   Intake/Output this shift:  No intake/output data recorded.  Physical Exam: General: NAD, laying in bed  Head: Normocephalic, atraumatic. Moist oral mucosal membranes  Eyes: Anicteric  Neck: Supple, trachea midline  Lungs:  Clear to auscultation  Heart: Regular rate and rhythm  Abdomen:  Soft, nontender,   Extremities:  no peripheral edema.  Neurologic: Alert and oriented   Skin: No lesions. Left great toe gauze dressing       Basic Metabolic Panel: Recent Labs  Lab 02/13/21 1130 02/13/21 1136 02/14/21 0402 02/16/21 0420 02/17/21 0623 02/18/21 0712  NA 138  --  140 139 137 138  K 3.7  --  3.8 4.2 4.3 5.1  CL 113*  --  115* 112* 109 108  CO2 18*  --  20* 22 21* 24  GLUCOSE 125*  --  187* 187* 172* 348*  BUN 53*  --  40* 37* 47* 43*  CREATININE 1.43*  --  1.28* 1.19 1.29* 1.29*  CALCIUM 8.2*  --  8.1* 8.3* 8.3* 8.3*  MG  --  2.0 2.1 2.0  --   --     Liver Function Tests: Recent Labs  Lab 02/13/21 1130 02/14/21 0402  AST 41 27  ALT 36 28  ALKPHOS 110 83  BILITOT 0.6 0.4  PROT 6.8 6.0*  ALBUMIN 3.5 3.0*   No results for input(s): LIPASE, AMYLASE in the last 168 hours. No results for input(s): AMMONIA in the last  168 hours.  CBC: Recent Labs  Lab 02/13/21 1130 02/14/21 0402 02/16/21 0420 02/18/21 0712  WBC 3.2* 2.9* 4.3 3.5*  NEUTROABS 2.2 1.7 3.0 2.7  HGB 8.7* 8.4* 9.1* 8.7*  HCT 26.5* 26.1* 26.7* 27.2*  MCV 94.6 94.9 93.4 96.8  PLT 100* 102* 103* 108*    Cardiac Enzymes: No results for input(s): CKTOTAL, CKMB, CKMBINDEX, TROPONINI in the last 168 hours.  BNP: Invalid input(s): POCBNP  CBG: Recent Labs  Lab 02/17/21 0804 02/17/21 1215 02/17/21 1624 02/17/21 2014 02/18/21 0837  GLUCAP 156* 167* 160* 210* 276*    Microbiology: Results for orders placed or performed during the hospital encounter of 02/13/21  Resp Panel by RT-PCR (Flu A&B, Covid) Nasopharyngeal Swab     Status: None   Collection Time: 02/13/21  4:03 PM   Specimen: Nasopharyngeal Swab; Nasopharyngeal(NP) swabs in vial transport medium  Result Value Ref Range Status   SARS Coronavirus 2 by RT PCR NEGATIVE NEGATIVE Final    Comment: (NOTE) SARS-CoV-2 target nucleic acids are NOT DETECTED.  The SARS-CoV-2 RNA is generally detectable in upper respiratory specimens during the acute phase of infection. The lowest concentration of SARS-CoV-2 viral copies this assay can detect is 138 copies/mL. A  negative result does not preclude SARS-Cov-2 infection and should not be used as the sole basis for treatment or other patient management decisions. A negative result may occur with  improper specimen collection/handling, submission of specimen other than nasopharyngeal swab, presence of viral mutation(s) within the areas targeted by this assay, and inadequate number of viral copies(<138 copies/mL). A negative result must be combined with clinical observations, patient history, and epidemiological information. The expected result is Negative.  Fact Sheet for Patients:  EntrepreneurPulse.com.au  Fact Sheet for Healthcare Providers:  IncredibleEmployment.be  This test is no t yet  approved or cleared by the [place] FDA and  has been authorized for detection and/or diagnosis of SARS-CoV-2 by FDA under an Emergency Use Authorization (EUA). This EUA will remain  in effect (meaning this test can be used) for the duration of the COVID-19 declaration under Section 564(b)(1) of the Act, 21 U.S.C.section 360bbb-3(b)(1), unless the authorization is terminated  or revoked sooner.       Influenza A by PCR NEGATIVE NEGATIVE Final   Influenza B by PCR NEGATIVE NEGATIVE Final    Comment: (NOTE) The Xpert Xpress SARS-CoV-2/FLU/RSV plus assay is intended as an aid in the diagnosis of influenza from Nasopharyngeal swab specimens and should not be used as a sole basis for treatment. Nasal washings and aspirates are unacceptable for Xpert Xpress SARS-CoV-2/FLU/RSV testing.  Fact Sheet for Patients: EntrepreneurPulse.com.au  Fact Sheet for Healthcare Providers: IncredibleEmployment.be  This test is not yet approved or cleared by the [place] FDA and has been authorized for detection and/or diagnosis of SARS-CoV-2 by FDA under an Emergency Use Authorization (EUA). This EUA will remain in effect (meaning this test can be used) for the duration of the COVID-19 declaration under Section 564(b)(1) of the Act, 21 U.S.C. section 360bbb-3(b)(1), unless the authorization is terminated or revoked.  Performed at [facility], [address] [place], [place] [address]   Culture, blood (Routine X 2) w Reflex to ID Panel     Status: None   Collection Time: 02/13/21  7:01 PM   Specimen: BLOOD  Result Value Ref Range Status   Specimen Description BLOOD RIGHT ANTECUBITAL  Final   Special Requests   Final    BOTTLES DRAWN AEROBIC AND ANAEROBIC Blood Culture adequate volume   Culture   Final    NO GROWTH 5 DAYS Performed at [facility], [address] [place], [place] [address]    Report Status 02/18/2021 FINAL   Final  Culture, blood (Routine X 2) w Reflex to ID Panel     Status: None   Collection Time: 02/13/21  7:10 PM   Specimen: BLOOD  Result Value Ref Range Status   Specimen Description BLOOD BLOOD LEFT ARM  Final   Special Requests   Final    BOTTLES DRAWN AEROBIC AND ANAEROBIC Blood Culture adequate volume   Culture   Final    NO GROWTH 5 DAYS Performed at [facility], [address], [place], [place] [address]    Report Status 02/18/2021 FINAL  Final  Aerobic/Anaerobic Culture w Gram Stain (surgical/deep wound)     Status: None (Preliminary result)   Collection Time: 02/17/21  2:56 PM   Specimen: Wound  Result Value Ref Range Status   Specimen Description   Final    WOUND Performed at [facility], [address], [place], [place] [address]    Special Requests   Final    NONE Performed at [facility], [phone]  [place], [place] [address]    Gram Stain   Final    NO WBC SEEN NO ORGANISMS SEEN Performed at [facility], [place] [address] [place], [place] [address]    Culture PENDING  Incomplete   Report Status PENDING  Incomplete    Coagulation Studies: No results for input(s): LABPROT, INR in the last 72 hours.  Urinalysis: No results for input(s): COLORURINE, LABSPEC, PHURINE, GLUCOSEU, HGBUR, BILIRUBINUR, KETONESUR, PROTEINUR, UROBILINOGEN, NITRITE, LEUKOCYTESUR in the last 72 hours.  Invalid input(s): APPERANCEUR    Imaging: PERIPHERAL VASCULAR CATHETERIZATION  Result Date: 02/17/2021 See op note  NM Bone Scan 3 Phase Lower Extremity  Result Date: 02/17/2021 CLINICAL DATA:  Foot swelling, diabetes, right great toe ulceration, edema EXAM: NUCLEAR MEDICINE 3-PHASE BONE SCAN TECHNIQUE: Radionuclide angiographic images, immediate static blood pool images, and 3-hour delayed static images were obtained of the bilateral feet after intravenous injection of radiopharmaceutical. Delayed SPECT CT imaging was also performed.  RADIOPHARMACEUTICALS:  23.65 mCi Tc-45mMDP IV COMPARISON:  02/13/2021 FINDINGS: Vascular phase: Asymmetric increased radiotracer distribution throughout the right foot, most pronounced within the first digit. Blood pool phase: Asymmetric increased radiotracer distribution throughout the right foot, greatest in the region of the first distal phalanx. Delayed phase: Localization of increased radiotracer activity to the first digit, in the region of the first metatarsal head, as well as the first proximal distal phalanges. IMPRESSION: 1. 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 Electronically Signed   By: MRanda NgoM.D.   On: 02/17/2021 18:18     Medications:   . sodium chloride 10 mL/hr at 02/17/21 2157  . cefTRIAXone (ROCEPHIN)  IV Stopped (02/17/21 2042)   . allopurinol  100 mg Oral Daily  . amLODipine  10 mg Oral Daily  . aspirin EC  81 mg Oral Daily  . baclofen  10 mg Oral BID  . calcium carbonate  1 tablet Oral TID WC  . carvedilol  12.5 mg Oral BID  . clopidogrel  75 mg Oral Daily  . gabapentin  100 mg Oral QHS  . insulin aspart  0-9 Units Subcutaneous TID WC  . insulin detemir  10 Units Subcutaneous BID  . isosorbide mononitrate  30 mg Oral Daily  . loratadine  10 mg Oral Daily  . multivitamin with minerals  1 tablet Oral Daily  . pantoprazole  40 mg Oral Daily  . polyethylene glycol  17 g Oral Daily  . povidone-iodine  2 application Topical Once  . Ensure Max Protein  11 oz Oral Daily  . rosuvastatin  40 mg Oral QHS  . senna-docusate  1 tablet Oral BID  . sertraline  200 mg Oral Daily  . tamsulosin  0.4 mg Oral Daily   sodium chloride, acetaminophen **OR** acetaminophen, fentaNYL (SUBLIMAZE) injection, HYDROcodone-acetaminophen, oxyCODONE  Assessment/ Plan:  Mr. Nathan Singleton a 80y.o.  male with past medical history of type 2 diabetes,severe aortic stenosis status post  TAVR,pacemaker,hypertension,and chronic anemia. He presented to the emergency room with complaints of progressive right foot pain.   1. Acute kidney injury on CKD stage IIIa  with baseline creatinine1.07on12/7/21. -Creatinine on admission 1.43, current creatinine 1.29 improving  -Angiogram was unsuccessful -Will monitor kidney function in the coming days  2. Diabetes type 2 with CKD  - Stable glucose - continue management per primary team   3. Infected right great toe  - podiatry following  -Planned  Irrigation and debridement with excision of IPJ with bone biopsy and culture     LOS: 5   3/8/20229:55 AM

## 2021-02-18 NOTE — Progress Notes (Signed)
Patient returned from OR in stable condition

## 2021-02-18 NOTE — Anesthesia Postprocedure Evaluation (Signed)
Anesthesia Post Note  Patient: Nathan Singleton  Procedure(s) Performed: IRRIGATION AND DEBRIDEMENT FOOT-Right Great Toe (Right )  Patient location during evaluation: PACU Anesthesia Type: General Level of consciousness: awake and alert Pain management: pain level controlled Vital Signs Assessment: post-procedure vital signs reviewed and stable Respiratory status: spontaneous breathing, nonlabored ventilation, respiratory function stable and patient connected to nasal cannula oxygen Cardiovascular status: blood pressure returned to baseline and stable Postop Assessment: no apparent nausea or vomiting Anesthetic complications: no   No complications documented.   Last Vitals:  Vitals:   02/18/21 1415 02/18/21 1430  BP: (!) 165/70 (!) 170/76  Pulse: (!) 58 (!) 59  Resp: 15 18  Temp:  36.4 C  SpO2: 100% 100%    Last Pain:  Vitals:   02/18/21 1430  TempSrc:   PainSc: 0-No pain                 Martha Clan

## 2021-02-18 NOTE — Anesthesia Procedure Notes (Signed)
Date/Time: 02/18/2021 12:36 PM Performed by: Johnna Acosta, CRNA Pre-anesthesia Checklist: Patient identified, Emergency Drugs available, Suction available, Patient being monitored and Timeout performed Patient Re-evaluated:Patient Re-evaluated prior to induction Oxygen Delivery Method: Simple face mask Preoxygenation: Pre-oxygenation with 100% oxygen Induction Type: IV induction

## 2021-02-18 NOTE — Consult Note (Signed)
Pharmacy Antibiotic Note  Nathan Singleton is a 80 y.o. male admitted on 02/13/2021 with chronic right great toe ulcer. Patient with PMH significant for insulin-dependent diabetes mellitus (A1c 8.7 02/2021), peripheral vascular disease, pacemaker placement, aortic stenosis s/p TAVR. Patient recently completed oral course of antibiotics (cephalexin 2/10 - 2/20) and noticed increase in swelling and redness over last few days.   Pharmacy has been reconsulted for Vancomycin dosing for cellulitis-diabetic foot infection  After angiogram 3/7 - the current plan with podiatry is to undergo excision of IPJ and biopsy of bone with culture 3/8 - will resume Vancomycin  Patient is also ordered ceftriaxone 2 gram Q24 - will continue  Plan:  Will assess random vancomycin level to see if loading dose necessary (last vanc dose 3/6 1627)  Random Vanc returned @ 13 - no load needed  Resume a maintenance regimen of 1250 mg Q24H for goal AUC 400-550  Anticipated AUC: 473  Scr used: 1.29  Continue to monitor patient follow renal function daily for possible dose adjustments   Height: 6' (182.9 cm) Weight: 111.3 kg (245 lb 6 oz) IBW/kg (Calculated) : 77.6  Temp (24hrs), Avg:98.7 F (37.1 C), Min:98.2 F (36.8 C), Max:99.1 F (37.3 C)  Recent Labs  Lab 02/13/21 1130 02/14/21 0402 02/16/21 0420 02/17/21 0623 02/18/21 0712 02/18/21 0923  WBC 3.2* 2.9* 4.3  --  3.5*  --   CREATININE 1.43* 1.28* 1.19 1.29* 1.29*  --   LATICACIDVEN 1.1 1.1  --   --   --   --   VANCORANDOM  --   --   --   --   --  13    Estimated Creatinine Clearance: 60.8 mL/min (A) (by C-G formula based on SCr of 1.29 mg/dL (H)).    Allergies  Allergen Reactions  . Atorvastatin Other (See Comments)    "bad for kidneys" Cramping   . Celecoxib Other (See Comments)    Other reaction(s): Other (See Comments) Kidney Problem Kidney Problem   . Dicyclomine Hcl Other (See Comments)    Stomach cramps  . Doxycycline Other (See  Comments)  . Metformin Other (See Comments)    Antimicrobials this admission: 3/3 Vancomycin >> 3/6, 3/8 >>  3/3 Ceftriaxone >> 3/3 Metronidazole >>   Dose adjustments this admission: Continue Vancomycin '1250mg'$  IV q24  Microbiology results: 3/3 BCx: sent  Thank you for allowing pharmacy to be a part of this patient's care.  Lu Duffel, PharmD, BCPS Clinical Pharmacist  02/18/2021 10:39 AM

## 2021-02-18 NOTE — Op Note (Signed)
Operative note   Surgeon:Raistlin Gum Lawyer: None    Preop diagnosis: Ulcer with possible osteomyelitis right great toe    Postop diagnosis: Same    Procedure: 1.  Excision head of proximal phalanx right great toe 2.  Excision lateral condyle distal phalanx right great toe    EBL: Minimal    Anesthesia:local and IV sedation    Hemostasis: None    Specimen: Bone for culture and bone for pathology with proximal margins inked    Complications: None    Operative indications:Nathan Singleton is an 80 y.o. that presents today for surgical intervention.  The risks/benefits/alternatives/complications have been discussed and consent has been given.    Procedure:  Patient was brought into the OR and placed on the operating table in thesupine position. After anesthesia was obtained theright lower extremity was prepped and draped in usual sterile fashion.  Attention was directed to the dorsal aspect of the right great toe at the interphalangeal joint where longitudinal incision was performed.  Sharp and blunt dissection carried down to the interphalangeal joint.  The head of the proximal phalanx was then exposed.  At approximately the surgical neck this was then excised.  A small portion of the distal medial aspect of this was removed and sent for culture.  Further contouring was performed.  Time there was noted to be an exostosis at the lateral aspect of the distal phalanx proximally at the ulcerative site was then excised.  This bone was also sent for culture.  A separate foot of the proximal phalanx was then performed and this was inked and sent as proximal margin within the pathology specimen container.  All areas were then flushed with copious amounts of irrigation.  Closure was performed with a 3-0 Vicryl and 3-0 nylon.  A sterile dressing was applied.    Patient tolerated the procedure and anesthesia well.  Was transported from the OR to the PACU with all vital signs stable and vascular  status intact. To be discharged per routine protocol.

## 2021-02-18 NOTE — Progress Notes (Signed)
PROGRESS NOTE    Nathan Singleton  [id number] DOB: 14-Oct-1941 DOA: 02/13/2021 PCP: Ranae Plumber, PA    Brief Narrative:  Patient with type 2 diabetes, severe aortic stenosis status post TAVR, SSS s/p pacemaker, hypertension, chronic anemia presented to the ER with increasing pain and swelling of the right foot especially right great toe.  Patient does have history of chronic right foot ulceration and recently worsening.  X-ray did not show osteomyelitis. Admitted with chronic ischemia of the legs, right great toe infection.   Assessment & Plan:   Principal Problem:   Diabetic foot ulcer ([place]) Active Problems:   Hypertension   DM2 (diabetes mellitus, type 2) ([place])   Hyperlipidemia associated with type 2 diabetes mellitus ([place])   Anemia   BPH (benign prostatic hyperplasia)  Diabetic foot ulceration/diabetic toe with spreading cellulitis: Aggravated by chronic ischemia. Presented with significant soft tissue swelling and erythema with ischemic ulcer on the plantar aspect of the great toe.  Treated with vancomycin ,Rocephin and Flagyl.  Significant improvement on the surrounding cellulitis.   Continue vancomycin and Rocephin. 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  Superficial cultures negative so far.  Blood cultures negative so far. Going for debridement/bone biopsy and culture today.  Surgical cultures will be appreciated. Depends upon operative findings, may need long-term antibiotics.  Peripheral arterial disease with previous arterial stent: Seen by vascular surgery.  Underwent lower extremity angiogram and angioplasty right SFA, right dorsalis pedis artery and distal anterior tibial artery.  Followed by vascular surgery.  Optimized for to surgery.  Sick sinus syndrome: s/p pacemaker placement.  MRI compatible. Interrogated with some palpitations at night.  Patient complains of intermittent palpitations.  Telemetry shows PVCs.  Symptom control with increasing  dose of Coreg.  Type 2 diabetes: A1c 7.5.  On insulin and Victoza at home.  Continue.  Resumed long-acting insulin.  Hyperlipidemia: Stable on statin.  Tolerating.  CKD stage IIIa: Renal functions at about baseline.  Followed by nephrology perioperative to optimize renal functions.  DVT prophylaxis: SCDs Start: 02/13/21 1800   Code Status: Full code. MOST form introduced and given to the patient and fianc. Family Communication: None today.  Talking to patient's fianc Ms. [name]. Disposition Plan: Status is: Inpatient  Remains inpatient appropriate because:IV treatments appropriate due to intensity of illness or inability to take PO and Inpatient level of care appropriate due to severity of illness   Dispo: The patient is from: Home              Anticipated d/c is to: Home              Patient currently is not medically stable to d/c.   Difficult to place patient No         Consultants:   Vascular surgery  Podiatry surgery  Nephrology  Cardiology, curbside  Procedures:   None  Antimicrobials:  Antibiotics Given (last 72 hours)    Date/Time Action Medication Dose Rate   02/15/21 1523 New Bag/Given   vancomycin (VANCOREADY) IVPB 1500 mg/300 mL 1,500 mg 150 mL/hr   02/15/21 1958 New Bag/Given   cefTRIAXone (ROCEPHIN) 2 g in sodium chloride 0.9 % 100 mL IVPB 2 g 200 mL/hr   02/16/21 1627 New Bag/Given   vancomycin (VANCOREADY) IVPB 1500 mg/300 mL 1,500 mg 150 mL/hr   02/16/21 2043 New Bag/Given   cefTRIAXone (ROCEPHIN) 2 g in sodium chloride 0.9 % 100 mL IVPB 2 g 200 mL/hr   02/17/21 2012 New Bag/Given  cefTRIAXone (ROCEPHIN) 2 g in sodium chloride 0.9 % 100 mL IVPB 2 g 200 mL/hr         Subjective: Patient seen and examined.  No overnight events.  Looking forward for to surgery.  No more palpitations last night.  Objective: Vitals:   02/17/21 2348 02/18/21 0424 02/18/21 0535 02/18/21 0808  BP: (!) 156/81 (!) 153/68  (!) 142/68  Pulse: 60 (!) 59  63   Resp: '20 16  18  '$ Temp: 98.5 F (36.9 C) 99.1 F (37.3 C)  98.9 F (37.2 C)  TempSrc: Oral Oral  Oral  SpO2: 96% 99%  97%  Weight:   111.3 kg   Height:        Intake/Output Summary (Last 24 hours) at 02/18/2021 0950 Last data filed at 02/18/2021 0647 Gross per 24 hour  Intake 1090.16 ml  Output 2400 ml  Net -1309.84 ml   Filed Weights   02/17/21 0453 02/17/21 1605 02/18/21 0535  Weight: 116.4 kg 112.9 kg 111.3 kg    Examination:  General exam: Looks comfortable.  On room air.  Pleasant. Respiratory system: Clear to auscultation. Respiratory effort normal. Cardiovascular system: S1 & S2 heard, regular rate rhythm.  Pacemaker present.  Gastrointestinal system: Abdomen is nondistended, soft and nontender. No organomegaly or masses felt. Normal bowel sounds heard. Central nervous system: Alert and oriented. No focal neurological deficits. Extremities: Right great toe with mildly tender ulcer on plantar aspect.   He does have some swelling and shiny skin near the great toe. Picture available in chart from today.    Data Reviewed: I have personally reviewed following labs and imaging studies  CBC: Recent Labs  Lab 02/13/21 1130 02/14/21 0402 02/16/21 0420 02/18/21 0712  WBC 3.2* 2.9* 4.3 3.5*  NEUTROABS 2.2 1.7 3.0 2.7  HGB 8.7* 8.4* 9.1* 8.7*  HCT 26.5* 26.1* 26.7* 27.2*  MCV 94.6 94.9 93.4 96.8  PLT 100* 102* 103* 123XX123*   Basic Metabolic Panel: Recent Labs  Lab 02/13/21 1130 02/13/21 1136 02/14/21 0402 02/16/21 0420 02/17/21 0623 02/18/21 0712  NA 138  --  140 139 137 138  K 3.7  --  3.8 4.2 4.3 5.1  CL 113*  --  115* 112* 109 108  CO2 18*  --  20* 22 21* 24  GLUCOSE 125*  --  187* 187* 172* 348*  BUN 53*  --  40* 37* 47* 43*  CREATININE 1.43*  --  1.28* 1.19 1.29* 1.29*  CALCIUM 8.2*  --  8.1* 8.3* 8.3* 8.3*  MG  --  2.0 2.1 2.0  --   --    GFR: Estimated Creatinine Clearance: 60.8 mL/min (A) (by C-G formula based on SCr of 1.29 mg/dL (H)). Liver  Function Tests: Recent Labs  Lab 02/13/21 1130 02/14/21 0402  AST 41 27  ALT 36 28  ALKPHOS 110 83  BILITOT 0.6 0.4  PROT 6.8 6.0*  ALBUMIN 3.5 3.0*   No results for input(s): LIPASE, AMYLASE in the last 168 hours. No results for input(s): AMMONIA in the last 168 hours. Coagulation Profile: Recent Labs  Lab 02/14/21 0402  INR 1.3*   Cardiac Enzymes: No results for input(s): CKTOTAL, CKMB, CKMBINDEX, TROPONINI in the last 168 hours. BNP (last 3 results) No results for input(s): PROBNP in the last 8760 hours. HbA1C: No results for input(s): HGBA1C in the last 72 hours. CBG: Recent Labs  Lab 02/17/21 0804 02/17/21 1215 02/17/21 1624 02/17/21 2014 02/18/21 0837  GLUCAP 156* 167* 160* 210* 276*  Lipid Profile: No results for input(s): CHOL, HDL, LDLCALC, TRIG, CHOLHDL, LDLDIRECT in the last 72 hours. Thyroid Function Tests: No results for input(s): TSH, T4TOTAL, FREET4, T3FREE, THYROIDAB in the last 72 hours. Anemia Panel: No results for input(s): VITAMINB12, FOLATE, FERRITIN, TIBC, IRON, RETICCTPCT in the last 72 hours. Sepsis Labs: Recent Labs  Lab 02/13/21 1130 02/14/21 0402  LATICACIDVEN 1.1 1.1    Recent Results (from the past 240 hour(s))  Resp Panel by RT-PCR (Flu A&B, Covid) Nasopharyngeal Swab     Status: None   Collection Time: 02/13/21  4:03 PM   Specimen: Nasopharyngeal Swab; Nasopharyngeal(NP) swabs in vial transport medium  Result Value Ref Range Status   SARS Coronavirus 2 by RT PCR NEGATIVE NEGATIVE Final    Comment: (NOTE) SARS-CoV-2 target nucleic acids are NOT DETECTED.  The SARS-CoV-2 RNA is generally detectable in upper respiratory specimens during the acute phase of infection. The lowest concentration of SARS-CoV-2 viral copies this assay can detect is 138 copies/mL. A negative result does not preclude SARS-Cov-2 infection and should not be used as the sole basis for treatment or other patient management decisions. A negative result  may occur with  improper specimen collection/handling, submission of specimen other than nasopharyngeal swab, presence of viral mutation(s) within the areas targeted by this assay, and inadequate number of viral copies(<138 copies/mL). A negative result must be combined with clinical observations, patient history, and epidemiological information. The expected result is Negative.  Fact Sheet for Patients:  EntrepreneurPulse.com.au  Fact Sheet for Healthcare Providers:  IncredibleEmployment.be  This test is no t yet approved or cleared by the [place] FDA and  has been authorized for detection and/or diagnosis of SARS-CoV-2 by FDA under an Emergency Use Authorization (EUA). This EUA will remain  in effect (meaning this test can be used) for the duration of the COVID-19 declaration under Section 564(b)(1) of the Act, 21 U.S.C.section 360bbb-3(b)(1), unless the authorization is terminated  or revoked sooner.       Influenza A by PCR NEGATIVE NEGATIVE Final   Influenza B by PCR NEGATIVE NEGATIVE Final    Comment: (NOTE) The Xpert Xpress SARS-CoV-2/FLU/RSV plus assay is intended as an aid in the diagnosis of influenza from Nasopharyngeal swab specimens and should not be used as a sole basis for treatment. Nasal washings and aspirates are unacceptable for Xpert Xpress SARS-CoV-2/FLU/RSV testing.  Fact Sheet for Patients: EntrepreneurPulse.com.au  Fact Sheet for Healthcare Providers: IncredibleEmployment.be  This test is not yet approved or cleared by the [place] FDA and has been authorized for detection and/or diagnosis of SARS-CoV-2 by FDA under an Emergency Use Authorization (EUA). This EUA will remain in effect (meaning this test can be used) for the duration of the COVID-19 declaration under Section 564(b)(1) of the Act, 21 U.S.C. section 360bbb-3(b)(1), unless the authorization is terminated  or revoked.  Performed at [facility], [address] [place], [place] [address]   Culture, blood (Routine X 2) w Reflex to ID Panel     Status: None   Collection Time: 02/13/21  7:01 PM   Specimen: BLOOD  Result Value Ref Range Status   Specimen Description BLOOD RIGHT ANTECUBITAL  Final   Special Requests   Final    BOTTLES DRAWN AEROBIC AND ANAEROBIC Blood Culture adequate volume   Culture   Final    NO GROWTH 5 DAYS Performed at [facility], [address] [place], [place] [address]    Report Status 02/18/2021 FINAL  Final  Culture,  blood (Routine X 2) w Reflex to ID Panel     Status: None   Collection Time: 02/13/21  7:10 PM   Specimen: BLOOD  Result Value Ref Range Status   Specimen Description BLOOD BLOOD LEFT ARM  Final   Special Requests   Final    BOTTLES DRAWN AEROBIC AND ANAEROBIC Blood Culture adequate volume   Culture   Final    NO GROWTH 5 DAYS Performed at [facility], [address] [place], [place] [address]    Report Status 02/18/2021 FINAL  Final  Aerobic/Anaerobic Culture w Gram Stain (surgical/deep wound)     Status: None (Preliminary result)   Collection Time: 02/17/21  2:56 PM   Specimen: Wound  Result Value Ref Range Status   Specimen Description   Final    WOUND Performed at [facility], [address] [place], [place] [address]    Special Requests   Final    NONE Performed at [facility], [address], [place], [place] [address]    Gram Stain   Final    NO WBC SEEN NO ORGANISMS SEEN Performed at [facility], [place] [address], [place],  [address]    Culture PENDING  Incomplete   Report Status PENDING  Incomplete         Radiology Studies: PERIPHERAL VASCULAR CATHETERIZATION  Result Date: 02/17/2021 See op note  NM Bone Scan 3 Phase Lower Extremity  Result Date: 02/17/2021 CLINICAL DATA:  Foot swelling, diabetes, right great toe ulceration, edema EXAM: NUCLEAR  MEDICINE 3-PHASE BONE SCAN TECHNIQUE: Radionuclide angiographic images, immediate static blood pool images, and 3-hour delayed static images were obtained of the bilateral feet after intravenous injection of radiopharmaceutical. Delayed SPECT CT imaging was also performed. RADIOPHARMACEUTICALS:  23.65 mCi Tc-61mMDP IV COMPARISON:  02/13/2021 FINDINGS: Vascular phase: Asymmetric increased radiotracer distribution throughout the right foot, most pronounced within the first digit. Blood pool phase: Asymmetric increased radiotracer distribution throughout the right foot, greatest in the region of the first distal phalanx. Delayed phase: Localization of increased radiotracer activity to the first digit, in the region of the first metatarsal head, as well as the first proximal distal phalanges. IMPRESSION: 1. 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 Electronically Signed   By: MRanda NgoM.D.   On: 02/17/2021 18:18        Scheduled Meds: . allopurinol  100 mg Oral Daily  . amLODipine  10 mg Oral Daily  . aspirin EC  81 mg Oral Daily  . baclofen  10 mg Oral BID  . calcium carbonate  1 tablet Oral TID WC  . carvedilol  12.5 mg Oral BID  . clopidogrel  75 mg Oral Daily  . gabapentin  100 mg Oral QHS  . insulin aspart  0-9 Units Subcutaneous TID WC  . insulin detemir  10 Units Subcutaneous BID  . isosorbide mononitrate  30 mg Oral Daily  . loratadine  10 mg Oral Daily  . multivitamin with minerals  1 tablet Oral Daily  . pantoprazole  40 mg Oral Daily  . polyethylene glycol  17 g Oral Daily  . povidone-iodine  2 application Topical Once  . Ensure Max Protein  11 oz Oral Daily  . rosuvastatin  40 mg Oral QHS  . senna-docusate  1 tablet Oral BID  . sertraline  200 mg Oral Daily  . tamsulosin  0.4 mg Oral Daily  Continuous Infusions: . sodium chloride 10 mL/hr at 02/17/21 2157  . cefTRIAXone  (ROCEPHIN)  IV Stopped (02/17/21 2042)     LOS: 5 days    Time spent: 30 minutes    Barb Merino, MD [facility] Pager [phone]

## 2021-02-18 NOTE — Progress Notes (Addendum)
Daily Progress Note   Subjective  - 1 Day Post-Op  F/u right great toe ulcer.  Underwent angio yesterday with increased perfusion.  Objective Vitals:   02/17/21 2102 02/17/21 2348 02/18/21 0424 02/18/21 0535  BP: (!) 147/73 (!) 156/81 (!) 153/68   Pulse: 66 60 (!) 59   Resp: '17 20 16   '$ Temp: 98.2 F (36.8 C) 98.5 F (36.9 C) 99.1 F (37.3 C)   TempSrc: Oral Oral Oral   SpO2: 96% 96% 99%   Weight:    111.3 kg  Height:        Physical Exam: Toe with 1.5 cm ulcer at IPJ.  No purulence.  Diffuse edema to toe.  Bone scan concerning for osteomyelitis     Laboratory CBC    Component Value Date/Time   WBC 4.3 02/16/2021 0420   HGB 9.1 (L) 02/16/2021 0420   HGB 11.7 (L) 11/19/2020 1439   HCT 26.7 (L) 02/16/2021 0420   HCT 34.3 (L) 11/19/2020 1439   PLT 103 (L) 02/16/2021 0420   PLT 113 (L) 11/19/2020 1439    BMET    Component Value Date/Time   NA 137 02/17/2021 0623   NA 137 11/19/2020 1439   K 4.3 02/17/2021 0623   CL 109 02/17/2021 0623   CO2 21 (L) 02/17/2021 0623   GLUCOSE 172 (H) 02/17/2021 0623   BUN 47 (H) 02/17/2021 0623   BUN 39 (H) 11/19/2020 1439   CREATININE 1.29 (H) 02/17/2021 0623   CALCIUM 8.3 (L) 02/17/2021 0623   GFRNONAA 57 (L) 02/17/2021 0623   GFRAA 76 11/19/2020 1439    Assessment/Planning: Non-healing ulcer right great toe. Concern for osteomyelitis.     I d/w pt concern for bone infection on bone scan.  Clinically wound is not deep and not draining but there is diffuse edema to toe.  I've discussed different options including continued local wound care with abx, amputation, or removal of bone at IPJ and biopsy.  Pt just underwent revascularization and is optimized from circulation standpoint to undergo procedure and after discussion of options we have elected to undergo excision of IPJ and biopsy of bone with culture.    Discussed r/b/a/c and consent given.  Pt added on for lunch time today.  NPO now.    [name]  A  02/18/2021, 7:56 AM

## 2021-02-18 NOTE — Transfer of Care (Signed)
Immediate Anesthesia Transfer of Care Note  Patient: Nathan Singleton  Procedure(s) Performed: IRRIGATION AND DEBRIDEMENT FOOT-Right Great Toe (Right )  Patient Location: PACU  Anesthesia Type:General  Level of Consciousness: awake, alert  and oriented  Airway & Oxygen Therapy: Patient Spontanous Breathing and Patient connected to face mask oxygen  Post-op Assessment: Report given to RN and Post -op Vital signs reviewed and stable  Post vital signs: Reviewed and stable  Last Vitals:  Vitals Value Taken Time  BP 158/69 02/18/21 1336  Temp 36.3 C 02/18/21 1336  Pulse 60 02/18/21 1336  Resp 16 02/18/21 1336  SpO2 100 % 02/18/21 1336    Last Pain:  Vitals:   02/18/21 1208  TempSrc: Oral  PainSc: 5       Patients Stated Pain Goal: 0 ([id number])  Complications: No complications documented.

## 2021-02-18 NOTE — Anesthesia Preprocedure Evaluation (Signed)
Anesthesia Evaluation  Patient identified by MRN, date of birth, ID band Patient awake    Reviewed: Allergy & Precautions, H&P , NPO status , Patient's Chart, lab work & pertinent test results, reviewed documented beta blocker date and time   History of Anesthesia Complications Negative for: history of anesthetic complications  Airway Mallampati: III  TM Distance: >3 FB Neck ROM: full    Dental  (+) Dental Advidsory Given, Poor Dentition, Missing, Chipped   Pulmonary shortness of breath and with exertion, sleep apnea , COPD, neg recent URI, former smoker,    Pulmonary exam normal breath sounds clear to auscultation       Cardiovascular Exercise Tolerance: Good hypertension, (-) angina+ CAD, + Past MI, + Cardiac Stents and + Peripheral Vascular Disease  Normal cardiovascular exam+ dysrhythmias + pacemaker (-) Valvular Problems/Murmurs Rhythm:regular Rate:Normal     Neuro/Psych PSYCHIATRIC DISORDERS Depression Bipolar Disorder negative neurological ROS     GI/Hepatic Neg liver ROS, PUD, GERD  ,  Endo/Other  diabetes  Renal/GU CRFRenal disease  negative genitourinary   Musculoskeletal   Abdominal   Peds  Hematology negative hematology ROS (+)   Anesthesia Other Findings Past Medical History: No date: Aneurysm (arteriovenous) of coronary vessels No date: Arthritis No date: Ascending aortic aneurysm (HCC) No date: CAD (coronary artery disease) No date: Diabetes mellitus without complication (HCC) No date: History of GI bleed No date: Hyperlipidemia associated with type 2 diabetes mellitus (H[facility]) No date: Hypertension associated with diabetes ([place]) No date: Presence of permanent cardiac pacemaker No date: PVD (peripheral vascular disease) ([place]) No date: S/P TAVR (transcatheter aortic valve replacement)   Reproductive/Obstetrics negative OB ROS                             Anesthesia  Physical Anesthesia Plan  ASA: III  Anesthesia Plan: General   Post-op Pain Management:    Induction: Intravenous  PONV Risk Score and Plan: 2 and TIVA and Propofol infusion  Airway Management Planned: Natural Airway and Simple Face Mask  Additional Equipment:   Intra-op Plan:   Post-operative Plan:   Informed Consent: I have reviewed the patients History and Physical, chart, labs and discussed the procedure including the risks, benefits and alternatives for the proposed anesthesia with the patient or authorized representative who has indicated his/her understanding and acceptance.     Dental Advisory Given  Plan Discussed with: Anesthesiologist, CRNA and Surgeon  Anesthesia Plan Comments:         Anesthesia Quick Evaluation

## 2021-02-18 NOTE — Plan of Care (Signed)
  Problem: Education: Goal: Knowledge of General Education information will improve Description: Including pain rating scale, medication(s)/side effects and non-pharmacologic comfort measures Outcome: Progressing   Problem: Clinical Measurements: Goal: Respiratory complications will improve Outcome: Progressing Goal: Cardiovascular complication will be avoided Outcome: Progressing   Problem: Nutrition: Goal: Adequate nutrition will be maintained Outcome: Progressing   Problem: Pain Managment: Goal: General experience of comfort will improve Outcome: Progressing   Problem: Skin Integrity: Goal: Risk for impaired skin integrity will decrease Outcome: Progressing

## 2021-02-19 ENCOUNTER — Encounter: Payer: Self-pay | Admitting: Podiatry

## 2021-02-19 DIAGNOSIS — E1142 Type 2 diabetes mellitus with diabetic polyneuropathy: Secondary | ICD-10-CM | POA: Diagnosis not present

## 2021-02-19 DIAGNOSIS — L0390 Cellulitis, unspecified: Secondary | ICD-10-CM | POA: Diagnosis not present

## 2021-02-19 DIAGNOSIS — E11621 Type 2 diabetes mellitus with foot ulcer: Secondary | ICD-10-CM | POA: Diagnosis not present

## 2021-02-19 DIAGNOSIS — I10 Essential (primary) hypertension: Secondary | ICD-10-CM | POA: Diagnosis not present

## 2021-02-19 LAB — CBC WITH DIFFERENTIAL/PLATELET
Abs Immature Granulocytes: 0.02 10*3/uL (ref 0.00–0.07)
Basophils Absolute: 0 10*3/uL (ref 0.0–0.1)
Basophils Relative: 1 %
Eosinophils Absolute: 0.1 10*3/uL (ref 0.0–0.5)
Eosinophils Relative: 3 %
HCT: 28 % — ABNORMAL LOW (ref 39.0–52.0)
Hemoglobin: 9 g/dL — ABNORMAL LOW (ref 13.0–17.0)
Immature Granulocytes: 1 %
Lymphocytes Relative: 20 %
Lymphs Abs: 0.8 10*3/uL (ref 0.7–4.0)
MCH: 31.1 pg (ref 26.0–34.0)
MCHC: 32.1 g/dL (ref 30.0–36.0)
MCV: 96.9 fL (ref 80.0–100.0)
Monocytes Absolute: 0.2 10*3/uL (ref 0.1–1.0)
Monocytes Relative: 6 %
Neutro Abs: 2.8 10*3/uL (ref 1.7–7.7)
Neutrophils Relative %: 69 %
Platelets: 118 10*3/uL — ABNORMAL LOW (ref 150–400)
RBC: 2.89 MIL/uL — ABNORMAL LOW (ref 4.22–5.81)
RDW: 13.1 % (ref 11.5–15.5)
WBC: 4 10*3/uL (ref 4.0–10.5)
nRBC: 0 % (ref 0.0–0.2)

## 2021-02-19 LAB — BASIC METABOLIC PANEL
Anion gap: 7 (ref 5–15)
BUN: 42 mg/dL — ABNORMAL HIGH (ref 8–23)
CO2: 26 mmol/L (ref 22–32)
Calcium: 8.4 mg/dL — ABNORMAL LOW (ref 8.9–10.3)
Chloride: 107 mmol/L (ref 98–111)
Creatinine, Ser: 1.29 mg/dL — ABNORMAL HIGH (ref 0.61–1.24)
GFR, Estimated: 57 mL/min — ABNORMAL LOW (ref >60)
Glucose, Bld: 154 mg/dL — ABNORMAL HIGH (ref 70–99)
Potassium: 5 mmol/L (ref 3.5–5.1)
Sodium: 140 mmol/L (ref 135–145)

## 2021-02-19 LAB — HEMOGLOBIN A1C
Hgb A1c MFr Bld: 7.8 % — ABNORMAL HIGH (ref 4.8–5.6)
Mean Plasma Glucose: 177.16 mg/dL

## 2021-02-19 LAB — GLUCOSE, CAPILLARY
Glucose-Capillary: 113 mg/dL — ABNORMAL HIGH (ref 70–99)
Glucose-Capillary: 161 mg/dL — ABNORMAL HIGH (ref 70–99)
Glucose-Capillary: 202 mg/dL — ABNORMAL HIGH (ref 70–99)
Glucose-Capillary: 140 mg/dL — ABNORMAL HIGH (ref 70–99)

## 2021-02-19 MED ORDER — FUROSEMIDE 20 MG PO TABS
20.0000 mg | ORAL_TABLET | Freq: Every day | ORAL | Status: DC
  Administered 2021-02-19 – 2021-02-22 (×4): 20 mg via ORAL
  Filled 2021-02-19 (×4): qty 1

## 2021-02-19 MED ORDER — INSULIN ASPART 100 UNIT/ML ~~LOC~~ SOLN
3.0000 [IU] | Freq: Three times a day (TID) | SUBCUTANEOUS | Status: DC
  Administered 2021-02-19 – 2021-02-22 (×7): 3 [IU] via SUBCUTANEOUS
  Filled 2021-02-19 (×7): qty 1

## 2021-02-19 MED ORDER — CLOPIDOGREL BISULFATE 75 MG PO TABS
75.0000 mg | ORAL_TABLET | Freq: Every day | ORAL | Status: DC
  Administered 2021-02-20 – 2021-02-22 (×3): 75 mg via ORAL
  Filled 2021-02-19 (×3): qty 1

## 2021-02-19 MED ORDER — INSULIN DETEMIR 100 UNIT/ML ~~LOC~~ SOLN
7.0000 [IU] | Freq: Two times a day (BID) | SUBCUTANEOUS | Status: DC
  Administered 2021-02-20: 7 [IU] via SUBCUTANEOUS
  Filled 2021-02-19 (×3): qty 0.07

## 2021-02-19 MED ORDER — INSULIN ASPART 100 UNIT/ML ~~LOC~~ SOLN
0.0000 [IU] | Freq: Every day | SUBCUTANEOUS | Status: DC
  Administered 2021-02-21: 3 [IU] via SUBCUTANEOUS
  Filled 2021-02-19: qty 1

## 2021-02-19 MED ORDER — INSULIN ASPART 100 UNIT/ML ~~LOC~~ SOLN
0.0000 [IU] | Freq: Three times a day (TID) | SUBCUTANEOUS | Status: DC
  Administered 2021-02-19: 18:00:00 3 [IU] via SUBCUTANEOUS
  Administered 2021-02-20: 17:00:00 2 [IU] via SUBCUTANEOUS
  Administered 2021-02-20 – 2021-02-21 (×3): 3 [IU] via SUBCUTANEOUS
  Administered 2021-02-21: 12:00:00 2 [IU] via SUBCUTANEOUS
  Administered 2021-02-22: 08:00:00 1 [IU] via SUBCUTANEOUS
  Filled 2021-02-19 (×7): qty 1

## 2021-02-19 MED ORDER — GABAPENTIN 100 MG PO CAPS: 100.0000 mg | ORAL_CAPSULE | Freq: Every day | ORAL | Status: DC

## 2021-02-19 NOTE — Progress Notes (Signed)
[facility] PROGRESS NOTE    Progress Note  Nathan Singleton  [id number] DOB: 11/24/1941 DOA: 02/13/2021 PCP: Ranae Plumber, PA     Brief Narrative:   Nathan Singleton is an 80 y.o. male medical history of poorly controlled diabetes mellitus type 2, aortic stenosis status post TAVR's, sick sinus syndrome status post pacemaker, essential hypertension chronic anemia presents to the ED with increasing pain and swelling of the right foot and right great toe he relates he has had an ulceration there x-ray was done that did not show osteomyelitis.  Antibiotics: None  Microbiology data: Blood culture:  Procedures: None  Assessment/Plan:   Diabetic foot ulcer ([place]) Started empirically on Vanco Rocephin and Flagyl.  There was significant provement of his surrounding cellulitis. 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 Was consulted who recommended I&D and bone biopsy negative till date. If bone biopsy positive will need IV antibiotics for 6 weeks.  If negative will need oral antibiotics for normal skin flora.  (Keflex for 7 to 10 days). Patient may be will be weightbearing per podiatrist. Awaiting PT OT evaluation.    Peripheral vascular disease with previous stent placement: Vascular surgery was consulted underwent lower extremity angiogram and angioplasty of the right SFA and a percutaneous transluminal angioplasty of the right dorsalis pedis artery and angioplasty of the distal anterior tibial artery. Will need to follow-up with vascular surgery as an outpatient. On aspirin. Hypertension  Sick sinus syndrome: Interrogated symptoms controlled on Coreg.  DM2 (diabetes mellitus, type 2) ([place]) With an A1c of 7.5. Resume long-acting insulin, start sliding scale insulin with meal coverage.  Hyperlipidemia associated with type 2 diabetes mellitus (HCC) Continue statins  DVT prophylaxis: lovenxo Family Communication:none Status is: Inpatient  Remains  inpatient appropriate because:Hemodynamically unstable   Dispo: The patient is from: Home              Anticipated d/c is to: Home              Patient currently is not medically stable to d/c.   Difficult to place patient No     Code Status:     Code Status Orders  (From admission, onward)         Start     Ordered   02/15/21 1641  Full code  Continuous        02/15/21 1640        Code Status History    Date Active Date Inactive Code Status Order ID Comments User Context   02/13/2021 1759 02/15/2021 1640 DNR [id number]  Rhetta Mura, DO Inpatient   11/21/2020 2137 11/22/2020 2249 DNR [id number]  Lenore Cordia, MD ED   Advance Care Planning Activity    Advance Directive Documentation   Flowsheet Row Most Recent Value  Type of Advance Directive Healthcare Power of [place]  [Jody Beast daughter]  Pre-existing out of facility DNR order (yellow form or pink MOST form) --  "MOST" Form in Place? --        IV Access:    Peripheral IV   Procedures and diagnostic studies:   PERIPHERAL VASCULAR CATHETERIZATION  Result Date: 02/17/2021 See op note  NM Bone Scan 3 Phase Lower Extremity  Result Date: 02/17/2021 CLINICAL DATA:  Foot swelling, diabetes, right great toe ulceration, edema EXAM: NUCLEAR MEDICINE 3-PHASE BONE SCAN TECHNIQUE: Radionuclide angiographic images, immediate static blood pool images, and 3-hour delayed static images were obtained of the bilateral feet after intravenous injection of radiopharmaceutical. Delayed SPECT  CT imaging was also performed. RADIOPHARMACEUTICALS:  23.65 mCi Tc-42mMDP IV COMPARISON:  02/13/2021 FINDINGS: Vascular phase: Asymmetric increased radiotracer distribution throughout the right foot, most pronounced within the first digit. Blood pool phase: Asymmetric increased radiotracer distribution throughout the right foot, greatest in the region of the first distal phalanx. Delayed phase: Localization of increased radiotracer activity  to the first digit, in the region of the first metatarsal head, as well as the first proximal distal phalanges. IMPRESSION: 1. 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 Electronically Signed   By: MRanda NgoM.D.   On: 02/17/2021 18:18   DG MINI C-ARM IMAGE ONLY  Result Date: 02/18/2021 There is no interpretation for this exam.  This order is for images obtained during a surgical procedure.  Please See "Surgeries" Tab for more information regarding the procedure.     Medical Consultants:    None.   Subjective:    Nathan Fortesno complaints.  Objective:    Vitals:   02/18/21 2357 02/19/21 0526 02/19/21 0816 02/19/21 1132  BP: (!) 147/49 (!) 166/62 (!) 171/82 127/75  Pulse: (!) 58 60 (!) 58 61  Resp: '20 20 18 20  '$ Temp: 98.3 F (36.8 C) 98.6 F (37 C) 98.1 F (36.7 C) (!) 97.5 F (36.4 C)  TempSrc: Oral     SpO2: 99% 99% 97% 98%  Weight:      Height:       SpO2: 98 % O2 Flow Rate (L/min): 8 L/min   Intake/Output Summary (Last 24 hours) at 02/19/2021 1337 Last data filed at 02/19/2021 1006 Gross per 24 hour  Intake 1650.05 ml  Output 1150 ml  Net 500.05 ml   Filed Weights   02/17/21 0453 02/17/21 1605 02/18/21 0535  Weight: 116.4 kg 112.9 kg 111.3 kg    Exam: General exam: In no acute distress. Respiratory system: Good air movement and clear to auscultation. Cardiovascular system: S1 & S2 heard, RRR. No JVD. Gastrointestinal system: Abdomen is nondistended, soft and nontender.  Extremities: No pedal edema. Skin: No rashes, lesions or ulcers Psychiatry: Judgement and insight appear normal. Mood & affect appropriate.    Data Reviewed:    Labs: Basic Metabolic Panel: Recent Labs  Lab 02/13/21 1136 02/14/21 0402 02/16/21 0420 02/17/21 0623 02/18/21 0712 02/19/21 0535  NA  --  140 139 137 138 140  K  --  3.8 4.2 4.3 5.1 5.0  CL  --  115* 112* 109 108 107   CO2  --  20* 22 21* 24 26  GLUCOSE  --  187* 187* 172* 348* 154*  BUN  --  40* 37* 47* 43* 42*  CREATININE  --  1.28* 1.19 1.29* 1.29* 1.29*  CALCIUM  --  8.1* 8.3* 8.3* 8.3* 8.4*  MG 2.0 2.1 2.0  --   --   --    GFR Estimated Creatinine Clearance: 60.8 mL/min (A) (by C-G formula based on SCr of 1.29 mg/dL (H)). Liver Function Tests: Recent Labs  Lab 02/13/21 1130 02/14/21 0402  AST 41 27  ALT 36 28  ALKPHOS 110 83  BILITOT 0.6 0.4  PROT 6.8 6.0*  ALBUMIN 3.5 3.0*   No results for input(s): LIPASE, AMYLASE in the last 168 hours. No results for input(s): AMMONIA in the last 168 hours. Coagulation profile Recent Labs  Lab 02/14/21 0402  INR 1.3*   COVID-19 Labs  No results for input(s): DDIMER, FERRITIN, LDH,  CRP in the last 72 hours.  Lab Results  Component Value Date   [place] NEGATIVE 02/13/2021   [place] NEGATIVE 12/31/2020   [place] NEGATIVE 11/21/2020    CBC: Recent Labs  Lab 02/13/21 1130 02/14/21 0402 02/16/21 0420 02/18/21 0712 02/19/21 0535  WBC 3.2* 2.9* 4.3 3.5* 4.0  NEUTROABS 2.2 1.7 3.0 2.7 2.8  HGB 8.7* 8.4* 9.1* 8.7* 9.0*  HCT 26.5* 26.1* 26.7* 27.2* 28.0*  MCV 94.6 94.9 93.4 96.8 96.9  PLT 100* 102* 103* 108* 118*   Cardiac Enzymes: No results for input(s): CKTOTAL, CKMB, CKMBINDEX, TROPONINI in the last 168 hours. BNP (last 3 results) No results for input(s): PROBNP in the last 8760 hours. CBG: Recent Labs  Lab 02/18/21 1344 02/18/21 1644 02/18/21 2030 02/19/21 0816 02/19/21 1134  GLUCAP 184* 218* 286* 113* 161*   D-Dimer: No results for input(s): DDIMER in the last 72 hours. Hgb A1c: No results for input(s): HGBA1C in the last 72 hours. Lipid Profile: No results for input(s): CHOL, HDL, LDLCALC, TRIG, CHOLHDL, LDLDIRECT in the last 72 hours. Thyroid function studies: No results for input(s): TSH, T4TOTAL, T3FREE, THYROIDAB in the last 72 hours.  Invalid input(s): FREET3 Anemia work up: No results for  input(s): VITAMINB12, FOLATE, FERRITIN, TIBC, IRON, RETICCTPCT in the last 72 hours. Sepsis Labs: Recent Labs  Lab 02/13/21 1130 02/14/21 0402 02/16/21 0420 02/18/21 0712 02/19/21 0535  WBC 3.2* 2.9* 4.3 3.5* 4.0  LATICACIDVEN 1.1 1.1  --   --   --    Microbiology Recent Results (from the past 240 hour(s))  Resp Panel by RT-PCR (Flu A&B, Covid) Nasopharyngeal Swab     Status: None   Collection Time: 02/13/21  4:03 PM   Specimen: Nasopharyngeal Swab; Nasopharyngeal(NP) swabs in vial transport medium  Result Value Ref Range Status   SARS Coronavirus 2 by RT PCR NEGATIVE NEGATIVE Final    Comment: (NOTE) SARS-CoV-2 target nucleic acids are NOT DETECTED.  The SARS-CoV-2 RNA is generally detectable in upper respiratory specimens during the acute phase of infection. The lowest concentration of SARS-CoV-2 viral copies this assay can detect is 138 copies/mL. A negative result does not preclude SARS-Cov-2 infection and should not be used as the sole basis for treatment or other patient management decisions. A negative result may occur with  improper specimen collection/handling, submission of specimen other than nasopharyngeal swab, presence of viral mutation(s) within the areas targeted by this assay, and inadequate number of viral copies(<138 copies/mL). A negative result must be combined with clinical observations, patient history, and epidemiological information. The expected result is Negative.  Fact Sheet for Patients:  EntrepreneurPulse.com.au  Fact Sheet for Healthcare Providers:  IncredibleEmployment.be  This test is no t yet approved or cleared by the [place] FDA and  has been authorized for detection and/or diagnosis of SARS-CoV-2 by FDA under an Emergency Use Authorization (EUA). This EUA will remain  in effect (meaning this test can be used) for the duration of the COVID-19 declaration under Section 564(b)(1) of the Act,  21 U.S.C.section 360bbb-3(b)(1), unless the authorization is terminated  or revoked sooner.       Influenza A by PCR NEGATIVE NEGATIVE Final   Influenza B by PCR NEGATIVE NEGATIVE Final    Comment: (NOTE) The Xpert Xpress SARS-CoV-2/FLU/RSV plus assay is intended as an aid in the diagnosis of influenza from Nasopharyngeal swab specimens and should not be used as a sole basis for treatment. Nasal washings and aspirates are unacceptable for Xpert Xpress SARS-CoV-2/FLU/RSV testing.  Fact  Sheet for Patients: EntrepreneurPulse.com.au  Fact Sheet for Healthcare Providers: IncredibleEmployment.be  This test is not yet approved or cleared by the [place] FDA and has been authorized for detection and/or diagnosis of SARS-CoV-2 by FDA under an Emergency Use Authorization (EUA). This EUA will remain in effect (meaning this test can be used) for the duration of the COVID-19 declaration under Section 564(b)(1) of the Act, 21 U.S.C. section 360bbb-3(b)(1), unless the authorization is terminated or revoked.  Performed at [facility], [address] [place], [place] [address]   Culture, blood (Routine X 2) w Reflex to ID Panel     Status: None   Collection Time: 02/13/21  7:01 PM   Specimen: BLOOD  Result Value Ref Range Status   Specimen Description BLOOD RIGHT ANTECUBITAL  Final   Special Requests   Final    BOTTLES DRAWN AEROBIC AND ANAEROBIC Blood Culture adequate volume   Culture   Final    NO GROWTH 5 DAYS Performed at [facility], [address] [place], [place] [address]    Report Status 02/18/2021 FINAL  Final  Culture, blood (Routine X 2) w Reflex to ID Panel     Status: None   Collection Time: 02/13/21  7:10 PM   Specimen: BLOOD  Result Value Ref Range Status   Specimen Description BLOOD BLOOD LEFT ARM  Final   Special Requests   Final    BOTTLES DRAWN AEROBIC AND ANAEROBIC Blood Culture adequate volume    Culture   Final    NO GROWTH 5 DAYS Performed at [facility], [address], [place], [place] [address]    Report Status 02/18/2021 FINAL  Final  Aerobic/Anaerobic Culture w Gram Stain (surgical/deep wound)     Status: None (Preliminary result)   Collection Time: 02/17/21  2:56 PM   Specimen: Wound  Result Value Ref Range Status   Specimen Description   Final    WOUND Performed at [facility], [address] [place], [place] [address]    Special Requests   Final    NONE Performed at [facility], [address] [place], [place] [address]    Gram Stain   Final    NO WBC SEEN NO ORGANISMS SEEN Performed at [facility], [place] [address] [place], [place] [address]    Culture   Final    RARE NORMAL SKIN FLORA NO ANAEROBES ISOLATED; CULTURE IN PROGRESS FOR 5 DAYS    Report Status PENDING  Incomplete  Aerobic/Anaerobic Culture w Gram Stain (surgical/deep wound)     Status: None (Preliminary result)   Collection Time: 02/18/21 12:51 PM   Specimen: PATH Bone resection; Tissue  Result Value Ref Range Status   Specimen Description   Final    TISSUE Performed at [facility], [address] [place], [place] [address]    Special Requests   Final    RIGHT GREAT TOE BONE CULTURE Performed at [facility], [place], [place] [address]    Gram Stain NO WBC SEEN NO ORGANISMS SEEN   Final   Culture   Final    NO GROWTH < 12 HOURS Performed at [facility], [place] [address] [place], [place] [address]    Report Status PENDING  Incomplete     Medications:   . allopurinol  100 mg Oral Daily  . amLODipine  10 mg Oral Daily  . aspirin EC  81 mg Oral Daily  .  baclofen  10 mg Oral BID  . calcium carbonate  1 tablet Oral TID WC  . carvedilol  12.5 mg Oral BID  . clopidogrel  75 mg Oral Daily  . gabapentin  100 mg Oral QHS  . insulin aspart  0-9 Units Subcutaneous TID WC  . insulin detemir  10 Units  Subcutaneous BID  . isosorbide mononitrate  30 mg Oral Daily  . loratadine  10 mg Oral Daily  . multivitamin with minerals  1 tablet Oral Daily  . pantoprazole  40 mg Oral Daily  . polyethylene glycol  17 g Oral Daily  . Ensure Max Protein  11 oz Oral Daily  . rosuvastatin  40 mg Oral QHS  . senna-docusate  1 tablet Oral BID  . sertraline  200 mg Oral Daily  . tamsulosin  0.4 mg Oral Daily   Continuous Infusions: . sodium chloride 10 mL/hr at 02/18/21 1229  . cefTRIAXone (ROCEPHIN)  IV Stopped (02/18/21 2158)  . vancomycin 1,250 mg (02/19/21 1201)      LOS: 6 days   Charlynne Cousins  [facility]  02/19/2021, 1:37 PM

## 2021-02-19 NOTE — Progress Notes (Signed)
ORTHOPAEDIC PROGRESS NOTE  REQUESTING PHYSICIAN: Charlynne Cousins, MD  Chief Complaint: Right great toe ulcer  HPI: Nathan Singleton is a 80 y.o. male who presents resting in bedside chair comfortably eating lunch.  Patient is kept surgical dressings clean, dry, and intact since yesterday.  Patient is status post 1 day right hallux arthroplasty.  Patient notes mild pain to the area of the procedure site today but overall is doing well.  Patient has been ambulating in surgical shoe and try to walk with heel contact since procedure.  Past Medical History:  Diagnosis Date  . Aneurysm (arteriovenous) of coronary vessels   . Arthritis   . Ascending aortic aneurysm ([place])   . CAD (coronary artery disease)   . Diabetes mellitus without complication ([place])   . History of GI bleed   . Hyperlipidemia associated with type 2 diabetes mellitus ([place])   . Hypertension associated with diabetes ([place])   . Presence of permanent cardiac pacemaker   . PVD (peripheral vascular disease) ([place])   . S/P TAVR (transcatheter aortic valve replacement)    Past Surgical History:  Procedure Laterality Date  . Ankle repaired Right   . Carpal Tunnel repaired Left   . CATARACT EXTRACTION Bilateral   . COLONOSCOPY    . Copillar implant     . DG CHOLECYSTOGRAPHY GALL BLADDER ([facility] HX)    . GASTRIC BYPASS    . heart valve replaced    . IRRIGATION AND DEBRIDEMENT FOOT Right 02/18/2021   Procedure: IRRIGATION AND DEBRIDEMENT FOOT-Right Great Toe;  Surgeon: [name], DPM;  Location: [facility] OR[facility];  Service: Podiatry;  Laterality: Right;  . LOWER EXTREMITY ANGIOGRAPHY Right 02/17/2021   Procedure: Lower Extremity Angiography;  Surgeon: Algernon Huxley, MD;  Location: [facility];  Service: Cardiovascular;  Laterality: Right;  . PACEMAKER GENERATOR CHANGE    . REPLACEMENT TOTAL KNEE BILATERAL    . RTC    . STENT PLACE LEFT URETER ([facility] HX) Right    Right Leg  . Toe nail removed Bilateral    Social History    Socioeconomic History  . Marital status: Single    Spouse name: Not on file  . Number of children: Not on file  . Years of education: Not on file  . Highest education level: Not on file  Occupational History  . Not on file  Tobacco Use  . Smoking status: Former Research scientist (life sciences)  . Smokeless tobacco: Never Used  Substance and Sexual Activity  . Alcohol use: Not Currently  . Drug use: Not Currently  . Sexual activity: Not on file  Other Topics Concern  . Not on file  Social History Narrative  . Not on file   Social Determinants of Health   Financial Resource Strain: Not on file  Food Insecurity: Not on file  Transportation Needs: Not on file  Physical Activity: Not on file  Stress: Not on file  Social Connections: Not on file   Family History  Problem Relation Age of Onset  . Heart disease Mother   . Alzheimer's disease Mother   . Heart disease Father   . Diabetes Father    Allergies  Allergen Reactions  . Atorvastatin Other (See Comments)    "bad for kidneys" Cramping   . Celecoxib Other (See Comments)    Other reaction(s): Other (See Comments) Kidney Problem Kidney Problem   . Dicyclomine Hcl Other (See Comments)    Stomach cramps  . Doxycycline Other (See Comments)  . Metformin Other (See  Comments)   Prior to Admission medications   Medication Sig Start Date End Date Taking? Authorizing Provider  calcium citrate-vitamin D (CITRACAL+D) 315-200 MG-UNIT tablet Take 1 tablet by mouth daily. 11/11/20  Yes [provider]  D-5000 125 MCG (5000 UT) TABS Take 1 tablet by mouth daily. 11/11/20  Yes [provider]  insulin glargine (LANTUS) 100 UNIT/ML Solostar Pen Inject 25 Units into the skin at bedtime.   Yes [provider]  liraglutide (VICTOZA) 18 MG/3ML SOPN Inject 1.8 mg into the skin daily.   Yes [provider]  multivitamin (ONE-A-DAY MEN'S) TABS tablet Take 1 tablet by mouth daily. 11/11/20  Yes [provider]   allopurinol (ZYLOPRIM) 100 MG tablet Take 100 mg by mouth daily. 10/19/20   [provider]  amLODipine (NORVASC) 10 MG tablet Take 10 mg by mouth daily.    [provider]  baclofen (LIORESAL) 10 MG tablet Take 10 mg by mouth 2 (two) times daily. 02/12/21   [provider]  BAYER ASPIRIN EC LOW DOSE 81 MG EC tablet Take 81 mg by mouth daily. 11/11/20   [provider]  carvedilol (COREG) 6.25 MG tablet Take 6.25 mg by mouth 2 (two) times daily. 11/11/20   [provider]  cetirizine (ZYRTEC) 10 MG tablet Take 10 mg by mouth daily. 11/11/20   [provider]  diphenoxylate-atropine (LOMOTIL) 2.5-0.025 MG tablet Take 1 tablet by mouth every 6 (six) hours as needed for diarrhea or loose stools. 01/01/21   [provider]  docusate sodium (COLACE) 100 MG capsule Take 200 mg by mouth 2 (two) times daily. 11/11/20   [provider]  furosemide (LASIX) 20 MG tablet Take 20 mg by mouth daily. 10/19/20   [provider]  gabapentin (NEURONTIN) 100 MG capsule Take 100 mg by mouth daily. 01/29/21   [provider]  HYDROcodone-acetaminophen (NORCO) 7.5-325 MG tablet Take 1-2 tablets by mouth every 6 (six) hours as needed for moderate pain or severe pain. 11/22/20   Swayze, Ava, DO  isosorbide mononitrate (IMDUR) 30 MG 24 hr tablet Take 30 mg by mouth daily. 02/12/21   [provider]  LINZESS 145 MCG CAPS capsule Take 145 mcg by mouth daily. 11/11/20   [provider]  losartan (COZAAR) 25 MG tablet Take 25 mg by mouth daily. 02/13/21   [provider]  pantoprazole (PROTONIX) 40 MG tablet Take 1 tablet (40 mg total) by mouth daily. 02/11/21   [name], MD  rosuvastatin (CRESTOR) 40 MG tablet Take 40 mg by mouth daily. 02/13/21   [provider]  sertraline (ZOLOFT) 100 MG tablet Take 200 mg by mouth daily. 11/11/20   [provider]  tamsulosin (FLOMAX) 0.4 MG CAPS capsule Take 0.4 mg  by mouth daily. 11/11/20   [provider]   PERIPHERAL VASCULAR CATHETERIZATION  Result Date: 02/17/2021 See op note  NM Bone Scan 3 Phase Lower Extremity  Result Date: 02/17/2021 CLINICAL DATA:  Foot swelling, diabetes, right great toe ulceration, edema EXAM: NUCLEAR MEDICINE 3-PHASE BONE SCAN TECHNIQUE: Radionuclide angiographic images, immediate static blood pool images, and 3-hour delayed static images were obtained of the bilateral feet after intravenous injection of radiopharmaceutical. Delayed SPECT CT imaging was also performed. RADIOPHARMACEUTICALS:  23.65 mCi Tc-53mMDP IV COMPARISON:  02/13/2021 FINDINGS: Vascular phase: Asymmetric increased radiotracer distribution throughout the right foot, most pronounced within the first digit. Blood pool phase: Asymmetric increased radiotracer distribution throughout the right foot, greatest in the region of  the first distal phalanx. Delayed phase: Localization of increased radiotracer activity to the first digit, in the region of the first metatarsal head, as well as the first proximal distal phalanges. IMPRESSION: 1. 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 Electronically Signed   By: Randa Ngo M.D.   On: 02/17/2021 18:18   DG MINI C-ARM IMAGE ONLY  Result Date: 02/18/2021 There is no interpretation for this exam.  This order is for images obtained during a surgical procedure.  Please See "Surgeries" Tab for more information regarding the procedure.    Positive ROS: All other systems have been reviewed and were otherwise negative with the exception of those mentioned in the HPI and as above.  12 point ROS was performed.  Physical Exam: General: Alert and oriented.  No apparent distress.  Vascular:  Left foot:Dorsalis Pedis:  absent Posterior Tibial:  absent  Right foot: Dorsalis Pedis:  absent Posterior Tibial:  absent  Neuro:absent  protective sensation although he is complaining of some pain with palpation to the right great toe  Derm: Ulceration present to the plantar aspect of the right IPJ hallux appears to be stable and appears to measure approximately 0.5 x 0.5 x 0.1 cm with no probing to bone.  The incision line to the right IPJ hallux dorsal medial appears to be intact with sutures intact and skin is well coapted.  Mild erythema and edema around the area consistent with postoperative course.  No acute signs of infection present on direct visualization today.  Ortho/MS: Mild pain on palpation right hallux.  Assessment: 1. Diabetic neuropathic ulceration right great toe status post right IPJ hallux arthroplasty with bone culture 2. PVD  Plan: -Patient seen and examined. -Stable wound present to the right IPJ hallux plantarly.  Improvement seen in the erythema and edema overall to the right lower extremity. -Incision line appears to be intact with skin edges well coapted to the right IPJ hallux dorsal.  No overt gross signs of infection present states the incision line or ulcerative area. -Redressed with Xeroform to the incision and the wound followed by 4 x 4 gauze, Kerlix, Coban.  Patient to keep dressings clean, dry, and intact for the next week until seen in the outpatient clinic with Dr. Vickki Muff. -So far bone culture is not producing any bacteria.  Pathology specimen pending.  If this does come back positive then would recommend 6 weeks IV antibiotics.  If negative then recommend oral antibiotics based on wound culture which grew normal skin flora previously.  Patient would then be able to potentially go home on Keflex for 7 to 10-day course. -Believe that a lot of patient's redness and swelling was likely due to peripheral vascular disease.  This appears to be resolved since revascularization procedure.  Appreciate vascular recommendations. -Patient may be weightbearing partially with heel contact and surgical  shoe.  Podiatry team to follow more peripherally at this time.  Reconsult if any further problems arise.  Discharge instructions placed in chart.  Okay to discharge from podiatry standpoint when medically stable wound cultures have been resulted and patient on appropriate antibiotic therapy for discharge  Caroline More, [facility]  02/19/2021 12:37 PM

## 2021-02-19 NOTE — Evaluation (Signed)
Physical Therapy Evaluation Patient Details Name: Nathan Singleton MRN: [id number] DOB: 1941/12/07 Today's Date: 02/19/2021   History of Present Illness  Nathan Singleton is a 15yoM who comes to [facility]  c increased Rt foot swelling, pain, chronic ulceration. Pt is s/p surgical debridement, arhtroplasty c podiaty 3/8. Patient orders for AMB in surgical shoe, heel weight bearing. PMH: poorly controlled DM2, aortic stenosis s/p TAVR, SSS s/p PM, HTN, chronic anemia. 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 No prior falls history.  Clinical Impression  Pt admitted with above diagnosis. Pt currently with functional limitations due to the deficits listed below (see "PT Problem List"). Upon entry, pt in bed, awake and agreeable to participate. The pt is alert, pleasant, interactive, and able to provide info regarding prior level of function, both in tolerance and independence. No physical assist needed in session, no unsteadiness or frank LOB. Pt educated on precautions and DME use. AMB about the unit without difficulty. Has some trouble with donning shorts over the post-op shoe. Unable to put his hearing aid back in after falling off with mask doff. Patient's performance this date reveals decreased ability, independence, and tolerance in performing all basic mobility required for performance of activities of daily living. Pt requires additional DME, close physical assistance, and cues for safe participate in mobility. Pt will benefit from skilled PT intervention to increase independence and safety with basic mobility in preparation for discharge to the venue listed below.       Follow Up Recommendations No PT follow up    Equipment Recommendations  Rolling walker with 5" wheels    Recommendations for Other Services       Precautions / Restrictions Precautions Precautions: Fall Restrictions Weight Bearing Restrictions: Yes RLE Weight Bearing: Weight bearing as tolerated (heel  weightbearing in surgical shoe,.)      Mobility  Bed Mobility Overal bed mobility: Modified Independent                  Transfers Overall transfer level: Modified independent                  Ambulation/Gait Ambulation/Gait assistance: Modified independent (Device/Increase time) Gait Distance (Feet): 190 Feet Assistive device: Rolling walker (2 wheeled) Gait Pattern/deviations: WFL(Within Functional Limits) Gait velocity: 0.36ms      Stairs            Wheelchair Mobility    Modified Rankin (Stroke Patients Only)       Balance Overall balance assessment: Modified Independent;No apparent balance deficits (not formally assessed)                                           Pertinent Vitals/Pain Pain Assessment: 0-10 Pain Score: 7  Pain Location: operative foot Pain Descriptors / Indicators: Aching Pain Intervention(s): Limited activity within patient's tolerance;Premedicated before session;Repositioned    Home Living Family/patient expects to be discharged to:: Private residence Living Arrangements: Spouse/significant other Available Help at Discharge: Available 24 hours/day Type of Home: House Home Access: Stairs to enter   [employer] Steps: 1 partial step Home Layout: One level Home Equipment: None      Prior Function Level of Independence: Independent               Hand Dominance        Extremity/Trunk Assessment  Communication   Communication: HOH (Left cochlear implabnt)  Cognition Arousal/Alertness: Awake/alert Behavior During Therapy: WFL for tasks assessed/performed Overall Cognitive Status: Within Functional Limits for tasks assessed                                        General Comments      Exercises     Assessment/Plan    PT Assessment Patient needs continued PT services  PT Problem List Decreased activity tolerance;Decreased  balance;Decreased knowledge of precautions;Decreased knowledge of use of DME       PT Treatment Interventions Gait training;Functional mobility training;Patient/family education    PT Goals (Current goals can be found in the Care Plan section)  Acute Rehab PT Goals Patient Stated Goal: return to home, use RW for mobility at home to promote healing PT Goal Formulation: With patient Time For Goal Achievement: 03/05/21 Potential to Achieve Goals: Good    Frequency Min 2X/week   Barriers to discharge        Co-evaluation               AM-PAC PT "6 Clicks" Mobility  Outcome Measure Help needed turning from your back to your side while in a flat bed without using bedrails?: None Help needed moving from lying on your back to sitting on the side of a flat bed without using bedrails?: None Help needed moving to and from a bed to a chair (including a wheelchair)?: None Help needed standing up from a chair using your arms (e.g., wheelchair or bedside chair)?: None Help needed to walk in hospital room?: A Little Help needed climbing 3-5 steps with a railing? : A Little 6 Click Score: 22    End of Session   Activity Tolerance: Patient tolerated treatment well Patient left: in bed;with call bell/phone within reach Nurse Communication: Mobility status PT Visit Diagnosis: Other abnormalities of gait and mobility (R26.89)    Time: [id number] PT Time Calculation (min) (ACUTE ONLY): 16 min   Charges:   PT Evaluation $PT Eval Moderate Complexity: 1 Mod          4:22 PM, 02/19/21 Etta Grandchild, PT, DPT Physical Therapist - [facility]  [phone] ([place])    [place] C 02/19/2021, 4:20 PM

## 2021-02-20 DIAGNOSIS — E11621 Type 2 diabetes mellitus with foot ulcer: Secondary | ICD-10-CM | POA: Diagnosis not present

## 2021-02-20 DIAGNOSIS — E1169 Type 2 diabetes mellitus with other specified complication: Secondary | ICD-10-CM | POA: Diagnosis not present

## 2021-02-20 DIAGNOSIS — L0390 Cellulitis, unspecified: Secondary | ICD-10-CM | POA: Diagnosis not present

## 2021-02-20 DIAGNOSIS — E1142 Type 2 diabetes mellitus with diabetic polyneuropathy: Secondary | ICD-10-CM | POA: Diagnosis not present

## 2021-02-20 LAB — CBC WITH DIFFERENTIAL/PLATELET
Abs Immature Granulocytes: 0.01 10*3/uL (ref 0.00–0.07)
Basophils Absolute: 0 10*3/uL (ref 0.0–0.1)
Basophils Relative: 1 %
Eosinophils Absolute: 0.2 10*3/uL (ref 0.0–0.5)
Eosinophils Relative: 5 %
HCT: 28.1 % — ABNORMAL LOW (ref 39.0–52.0)
Hemoglobin: 8.9 g/dL — ABNORMAL LOW (ref 13.0–17.0)
Immature Granulocytes: 0 %
Lymphocytes Relative: 26 %
Lymphs Abs: 1 10*3/uL (ref 0.7–4.0)
MCH: 30.5 pg (ref 26.0–34.0)
MCHC: 31.7 g/dL (ref 30.0–36.0)
MCV: 96.2 fL (ref 80.0–100.0)
Monocytes Absolute: 0.2 10*3/uL (ref 0.1–1.0)
Monocytes Relative: 6 %
Neutro Abs: 2.3 10*3/uL (ref 1.7–7.7)
Neutrophils Relative %: 62 %
Platelets: 126 10*3/uL — ABNORMAL LOW (ref 150–400)
RBC: 2.92 MIL/uL — ABNORMAL LOW (ref 4.22–5.81)
RDW: 12.9 % (ref 11.5–15.5)
WBC: 3.7 10*3/uL — ABNORMAL LOW (ref 4.0–10.5)
nRBC: 0 % (ref 0.0–0.2)

## 2021-02-20 LAB — GLUCOSE, CAPILLARY
Glucose-Capillary: 201 mg/dL — ABNORMAL HIGH (ref 70–99)
Glucose-Capillary: 236 mg/dL — ABNORMAL HIGH (ref 70–99)
Glucose-Capillary: 173 mg/dL — ABNORMAL HIGH (ref 70–99)
Glucose-Capillary: 161 mg/dL — ABNORMAL HIGH (ref 70–99)

## 2021-02-20 LAB — SURGICAL PATHOLOGY

## 2021-02-20 MED ORDER — INSULIN DETEMIR 100 UNIT/ML ~~LOC~~ SOLN
10.0000 [IU] | Freq: Two times a day (BID) | SUBCUTANEOUS | Status: DC
  Administered 2021-02-20 – 2021-02-22 (×4): 10 [IU] via SUBCUTANEOUS
  Filled 2021-02-20 (×6): qty 0.1

## 2021-02-20 MED ORDER — LOSARTAN POTASSIUM 25 MG PO TABS
25.0000 mg | ORAL_TABLET | Freq: Every day | ORAL | Status: DC
  Administered 2021-02-20 – 2021-02-21 (×2): 25 mg via ORAL
  Filled 2021-02-20 (×2): qty 1

## 2021-02-20 NOTE — Evaluation (Signed)
Occupational Therapy Evaluation Patient Details Name: Nathan Singleton MRN: [id number] DOB: 12-Apr-1941 Today's Date: 02/20/2021    History of Present Illness Nathan Singleton is a 35yoM who comes to [facility]  c increased Rt foot swelling, pain, chronic ulceration. Pt is s/p surgical debridement, arhtroplasty c podiaty 3/8. Patient orders for AMB in surgical shoe, heel weight bearing. PMH: poorly controlled DM2, aortic stenosis s/p TAVR, SSS s/p PM, HTN, chronic anemia. 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 No prior falls history.   Clinical Impression   Upon entering the room, pt supine in bed with no c/o pain. Pt performed bed mobility without assistance. Pt demonstrated donning R off loading shoe from EOB independently. Pt standing and ambulating to bathroom with use of RW at supervision level and progressing to mod I. Pt performed toileting needs, hygiene, and clothing management without assistance or LOB. Pt returning to bed at end of session after tidying up space and bed linens without assistance. Pt does not have skilled OT need at this time. OT to SIGN OFF. Thank you for this referral.     Follow Up Recommendations  No OT follow up;Supervision - Intermittent    Equipment Recommendations  Tub/shower bench;Other (comment) (RW)       Precautions / Restrictions Precautions Precautions: Fall Restrictions Weight Bearing Restrictions: Yes RLE Weight Bearing: Weight bearing as tolerated Other Position/Activity Restrictions: with surgical shoe donned      Mobility Bed Mobility Overal bed mobility: Modified Independent                  Transfers Overall transfer level: Modified independent Equipment used: Rolling walker (2 wheeled)                  Balance Overall balance assessment: Modified Independent;No apparent balance deficits (not formally assessed)                                         ADL either performed or  assessed with clinical judgement   ADL Overall ADL's : Modified independent                                       General ADL Comments: Pt with overall supervision - mod I for all aspects of self care this session with use of RW and increased time to complete tasks.     Vision Baseline Vision/History: No visual deficits Patient Visual Report: No change from baseline              Pertinent Vitals/Pain Pain Assessment: No/denies pain     Hand Dominance Right   Extremity/Trunk Assessment Upper Extremity Assessment Upper Extremity Assessment: Overall WFL for tasks assessed   Lower Extremity Assessment Lower Extremity Assessment: Defer to PT evaluation       Communication Communication Communication: HOH   Cognition Arousal/Alertness: Awake/alert Behavior During Therapy: WFL for tasks assessed/performed Overall Cognitive Status: Within Functional Limits for tasks assessed                                                Home Living Family/patient expects to be discharged to:: Private residence Living Arrangements:  Spouse/significant other Available Help at Discharge: Available 24 hours/day Type of Home: House Home Access: Stairs to enter [employer] of Steps: 1 partial step   Home Layout: One level     Bathroom Shower/Tub: Teacher, early years/pre: Standard     Home Equipment: None          Prior Functioning/Environment Level of Independence: Independent                          OT Goals(Current goals can be found in the care plan section) Acute Rehab OT Goals Patient Stated Goal: to return home OT Goal Formulation: With patient Potential to Achieve Goals: Good      AM-PAC OT "6 Clicks" Daily Activity     Outcome Measure Help from another person eating meals?: None Help from another person taking care of personal grooming?: None Help from another person toileting, which includes using  toliet, bedpan, or urinal?: None Help from another person bathing (including washing, rinsing, drying)?: None Help from another person to put on and taking off regular upper body clothing?: None Help from another person to put on and taking off regular lower body clothing?: None 6 Click Score: 24   End of Session Equipment Utilized During Treatment: Rolling walker Nurse Communication: Mobility status  Activity Tolerance: Patient tolerated treatment well Patient left: in bed;with call bell/phone within reach                   Time: 0940-1000 OT Time Calculation (min): 20 min Charges:  OT General Charges $OT Visit: 1 Visit OT Evaluation $OT Eval Low Complexity: 1 Low OT Treatments $Self Care/Home Management : 8-22 mins  Darleen Crocker, MS, OTR/L , CBIS ascom [phone]  02/20/21, 12:38 PM

## 2021-02-20 NOTE — Progress Notes (Signed)
[facility] PROGRESS NOTE    Progress Note  Nathan Singleton  [id number] DOB: 1940/12/24 DOA: 02/13/2021 PCP: Ranae Plumber, PA     Brief Narrative:   Nathan Singleton is an 80 y.o. male medical history of poorly controlled diabetes mellitus type 2, aortic stenosis status post TAVR's, sick sinus syndrome status post pacemaker, essential hypertension chronic anemia presents to the ED with increasing pain and swelling of the right foot and right great toe he relates he has had an ulceration there x-ray was done that did not show osteomyelitis.  Antibiotics: None  Microbiology data: Blood culture:  Procedures: None  Assessment/Plan:   Diabetic foot ulcer ([place]) Started empirically on Vanco Rocephin and Flagyl.   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 Podiatry was consulted perform I&D to biopsy 02/18/2021 awaiting results to determine whether he has osteomyelitis. If bone biopsy positive will need IV antibiotics for 6 weeks.  If negative will need oral antibiotics for normal skin flora.  (Keflex for 7 to 10 days). Patient may be will be weightbearing per podiatrist. Physical therapy evaluated the patient the recommended rolling walker no follow-up PT.    Peripheral vascular disease with previous stent placement: Vascular surgery was consulted underwent lower extremity angiogram and angioplasty of the right SFA and a percutaneous transluminal angioplasty of the right dorsalis pedis artery and angioplasty of the distal anterior tibial artery. Will need to follow-up with vascular surgery as an outpatient. Continue aspirin and Plavix as an outpatient.  Essential hypertension; I will resume all of his home medications. Check a basic metabolic panel in the morning.  Sick sinus syndrome: Interrogated symptoms controlled on Coreg.  DM2 (diabetes mellitus, type 2) ([place]) With an A1c of 7.5. Resume long-acting insulin, start sliding scale insulin with meal  coverage.  Hyperlipidemia associated with type 2 diabetes mellitus (HCC) Continue statins  DVT prophylaxis: lovenxo Family Communication:none Status is: Inpatient  Remains inpatient appropriate because:Hemodynamically unstable   Dispo: The patient is from: Home              Anticipated d/c is to: Home              Patient currently is not medically stable to d/c.   Difficult to place patient No     Code Status:     Code Status Orders  (From admission, onward)         Start     Ordered   02/15/21 1641  Full code  Continuous        02/15/21 1640        Code Status History    Date Active Date Inactive Code Status Order ID Comments User Context   02/13/2021 1759 02/15/2021 1640 DNR [id number]  Rhetta Mura, DO Inpatient   11/21/2020 2137 11/22/2020 2249 DNR [id number]  Lenore Cordia, MD ED   Advance Care Planning Activity    Advance Directive Documentation   Flowsheet Row Most Recent Value  Type of Advance Directive Healthcare Power of Kaser  [Jody Beast daughter]  Pre-existing out of facility DNR order (yellow form or pink MOST form) -  "MOST" Form in Place? -        IV Access:    Peripheral IV   Procedures and diagnostic studies:   DG MINI C-ARM IMAGE ONLY  Result Date: 02/18/2021 There is no interpretation for this exam.  This order is for images obtained during a surgical procedure.  Please See "Surgeries" Tab for more information regarding the  procedure.     Medical Consultants:    None.   Subjective:    Durward Fortes no new complaints.  Objective:    Vitals:   02/19/21 2359 02/20/21 0413 02/20/21 0500 02/20/21 0806  BP: (!) 110/40 (!) 152/55  (!) 157/70  Pulse: 60 60  60  Resp: '19 16  18  '$ Temp: 97.9 F (36.6 C) 98.1 F (36.7 C)  (!) 97.5 F (36.4 C)  TempSrc: Oral Oral    SpO2: 97% 97%  100%  Weight:   114.6 kg   Height:       SpO2: 100 % O2 Flow Rate (L/min): 8 L/min   Intake/Output Summary (Last 24 hours) at  02/20/2021 0919 Last data filed at 02/19/2021 1402 Gross per 24 hour  Intake 360 ml  Output -  Net 360 ml   Filed Weights   02/17/21 1605 02/18/21 0535 02/20/21 0500  Weight: 112.9 kg 111.3 kg 114.6 kg    Exam: General exam: In no acute distress. Respiratory system: Good air movement and clear to auscultation. Cardiovascular system: S1 & S2 heard, RRR. No JVD. Gastrointestinal system: Abdomen is nondistended, soft and nontender.  Extremities: No pedal edema. Skin: No rashes, lesions or ulcers Psychiatry: Judgement and insight appear normal. Mood & affect appropriate. Data Reviewed:    Labs: Basic Metabolic Panel: Recent Labs  Lab 02/13/21 1136 02/14/21 0402 02/16/21 0420 02/17/21 0623 02/18/21 0712 02/19/21 0535  NA  --  140 139 137 138 140  K  --  3.8 4.2 4.3 5.1 5.0  CL  --  115* 112* 109 108 107  CO2  --  20* 22 21* 24 26  GLUCOSE  --  187* 187* 172* 348* 154*  BUN  --  40* 37* 47* 43* 42*  CREATININE  --  1.28* 1.19 1.29* 1.29* 1.29*  CALCIUM  --  8.1* 8.3* 8.3* 8.3* 8.4*  MG 2.0 2.1 2.0  --   --   --    GFR Estimated Creatinine Clearance: 61.7 mL/min (A) (by C-G formula based on SCr of 1.29 mg/dL (H)). Liver Function Tests: Recent Labs  Lab 02/13/21 1130 02/14/21 0402  AST 41 27  ALT 36 28  ALKPHOS 110 83  BILITOT 0.6 0.4  PROT 6.8 6.0*  ALBUMIN 3.5 3.0*   No results for input(s): LIPASE, AMYLASE in the last 168 hours. No results for input(s): AMMONIA in the last 168 hours. Coagulation profile Recent Labs  Lab 02/14/21 0402  INR 1.3*   COVID-19 Labs  No results for input(s): DDIMER, FERRITIN, LDH, CRP in the last 72 hours.  Lab Results  Component Value Date   [place] NEGATIVE 02/13/2021   [place] NEGATIVE 12/31/2020   [place] NEGATIVE 11/21/2020    CBC: Recent Labs  Lab 02/14/21 0402 02/16/21 0420 02/18/21 0712 02/19/21 0535 02/20/21 0521  WBC 2.9* 4.3 3.5* 4.0 3.7*  NEUTROABS 1.7 3.0 2.7 2.8 2.3  HGB 8.4* 9.1* 8.7*  9.0* 8.9*  HCT 26.1* 26.7* 27.2* 28.0* 28.1*  MCV 94.9 93.4 96.8 96.9 96.2  PLT 102* 103* 108* 118* 126*   Cardiac Enzymes: No results for input(s): CKTOTAL, CKMB, CKMBINDEX, TROPONINI in the last 168 hours. BNP (last 3 results) No results for input(s): PROBNP in the last 8760 hours. CBG: Recent Labs  Lab 02/19/21 0816 02/19/21 1134 02/19/21 1646 02/19/21 2035 02/20/21 0809  GLUCAP 113* 161* 202* 140* 201*   D-Dimer: No results for input(s): DDIMER in the last 72 hours. Hgb A1c: Recent Labs  02/19/21 0535  HGBA1C 7.8*   Lipid Profile: No results for input(s): CHOL, HDL, LDLCALC, TRIG, CHOLHDL, LDLDIRECT in the last 72 hours. Thyroid function studies: No results for input(s): TSH, T4TOTAL, T3FREE, THYROIDAB in the last 72 hours.  Invalid input(s): FREET3 Anemia work up: No results for input(s): VITAMINB12, FOLATE, FERRITIN, TIBC, IRON, RETICCTPCT in the last 72 hours. Sepsis Labs: Recent Labs  Lab 02/13/21 1130 02/14/21 0402 02/16/21 0420 02/18/21 0712 02/19/21 0535 02/20/21 0521  WBC 3.2* 2.9* 4.3 3.5* 4.0 3.7*  LATICACIDVEN 1.1 1.1  --   --   --   --    Microbiology Recent Results (from the past 240 hour(s))  Resp Panel by RT-PCR (Flu A&B, Covid) Nasopharyngeal Swab     Status: None   Collection Time: 02/13/21  4:03 PM   Specimen: Nasopharyngeal Swab; Nasopharyngeal(NP) swabs in vial transport medium  Result Value Ref Range Status   SARS Coronavirus 2 by RT PCR NEGATIVE NEGATIVE Final    Comment: (NOTE) SARS-CoV-2 target nucleic acids are NOT DETECTED.  The SARS-CoV-2 RNA is generally detectable in upper respiratory specimens during the acute phase of infection. The lowest concentration of SARS-CoV-2 viral copies this assay can detect is 138 copies/mL. A negative result does not preclude SARS-Cov-2 infection and should not be used as the sole basis for treatment or other patient management decisions. A negative result may occur with  improper specimen  collection/handling, submission of specimen other than nasopharyngeal swab, presence of viral mutation(s) within the areas targeted by this assay, and inadequate number of viral copies(<138 copies/mL). A negative result must be combined with clinical observations, patient history, and epidemiological information. The expected result is Negative.  Fact Sheet for Patients:  EntrepreneurPulse.com.au  Fact Sheet for Healthcare Providers:  IncredibleEmployment.be  This test is no t yet approved or cleared by the [place] FDA and  has been authorized for detection and/or diagnosis of SARS-CoV-2 by FDA under an Emergency Use Authorization (EUA). This EUA will remain  in effect (meaning this test can be used) for the duration of the COVID-19 declaration under Section 564(b)(1) of the Act, 21 U.S.C.section 360bbb-3(b)(1), unless the authorization is terminated  or revoked sooner.       Influenza A by PCR NEGATIVE NEGATIVE Final   Influenza B by PCR NEGATIVE NEGATIVE Final    Comment: (NOTE) The Xpert Xpress SARS-CoV-2/FLU/RSV plus assay is intended as an aid in the diagnosis of influenza from Nasopharyngeal swab specimens and should not be used as a sole basis for treatment. Nasal washings and aspirates are unacceptable for Xpert Xpress SARS-CoV-2/FLU/RSV testing.  Fact Sheet for Patients: EntrepreneurPulse.com.au  Fact Sheet for Healthcare Providers: IncredibleEmployment.be  This test is not yet approved or cleared by the [place] FDA and has been authorized for detection and/or diagnosis of SARS-CoV-2 by FDA under an Emergency Use Authorization (EUA). This EUA will remain in effect (meaning this test can be used) for the duration of the COVID-19 declaration under Section 564(b)(1) of the Act, 21 U.S.C. section 360bbb-3(b)(1), unless the authorization is terminated or revoked.  Performed at [facility], [address] [place], [place] [address]   Culture, blood (Routine X 2) w Reflex to ID Panel     Status: None   Collection Time: 02/13/21  7:01 PM   Specimen: BLOOD  Result Value Ref Range Status   Specimen Description BLOOD RIGHT ANTECUBITAL  Final   Special Requests   Final    BOTTLES DRAWN AEROBIC AND ANAEROBIC Blood  Culture adequate volume   Culture   Final    NO GROWTH 5 DAYS Performed at [facility], [address] [place], [place] [address]    Report Status 02/18/2021 FINAL  Final  Culture, blood (Routine X 2) w Reflex to ID Panel     Status: None   Collection Time: 02/13/21  7:10 PM   Specimen: BLOOD  Result Value Ref Range Status   Specimen Description BLOOD BLOOD LEFT ARM  Final   Special Requests   Final    BOTTLES DRAWN AEROBIC AND ANAEROBIC Blood Culture adequate volume   Culture   Final    NO GROWTH 5 DAYS Performed at [facility], [address] [place], [place] [address]    Report Status 02/18/2021 FINAL  Final  Aerobic/Anaerobic Culture w Gram Stain (surgical/deep wound)     Status: None (Preliminary result)   Collection Time: 02/17/21  2:56 PM   Specimen: Wound  Result Value Ref Range Status   Specimen Description   Final    WOUND Performed at [facility], [address] [place], [place] [address]    Special Requests   Final    NONE Performed at [facility], [address] [place], [place] [address]    Gram Stain   Final    NO WBC SEEN NO ORGANISMS SEEN Performed at [facility], [place] [address] [place], [place] [address]    Culture   Final    RARE NORMAL SKIN FLORA NO ANAEROBES ISOLATED; CULTURE IN PROGRESS FOR 5 DAYS    Report Status PENDING  Incomplete  Aerobic/Anaerobic Culture w Gram Stain (surgical/deep wound)     Status: None (Preliminary result)   Collection Time: 02/18/21 12:51 PM   Specimen: PATH Bone resection; Tissue  Result Value Ref Range Status   Specimen  Description   Final    TISSUE Performed at [facility], [address], [place], [place] [address]    Special Requests   Final    RIGHT GREAT TOE BONE CULTURE Performed at [facility], [place], [place] [address]    Gram Stain NO WBC SEEN NO ORGANISMS SEEN   Final   Culture   Final    NO GROWTH < 12 HOURS Performed at [facility], [place] [address], [place],  [address]    Report Status PENDING  Incomplete     Medications:   . allopurinol  100 mg Oral Daily  . amLODipine  10 mg Oral Daily  . aspirin EC  81 mg Oral Daily  . baclofen  10 mg Oral BID  . calcium carbonate  1 tablet Oral TID WC  . carvedilol  12.5 mg Oral BID  . clopidogrel  75 mg Oral Daily  . furosemide  20 mg Oral Daily  . gabapentin  100 mg Oral QHS  . insulin aspart  0-5 Units Subcutaneous QHS  . insulin aspart  0-9 Units Subcutaneous TID WC  . insulin aspart  3 Units Subcutaneous TID WC  . insulin detemir  7 Units Subcutaneous BID  . isosorbide mononitrate  30 mg Oral Daily  . loratadine  10 mg Oral Daily  . multivitamin with minerals  1 tablet Oral Daily  . pantoprazole  40 mg Oral Daily  . polyethylene glycol  17 g Oral Daily  . Ensure Max Protein  11 oz Oral Daily  . rosuvastatin  40 mg Oral QHS  . senna-docusate  1  tablet Oral BID  . sertraline  200 mg Oral Daily  . tamsulosin  0.4 mg Oral Daily   Continuous Infusions: . sodium chloride 10 mL/hr at 02/18/21 1229  . cefTRIAXone (ROCEPHIN)  IV 2 g (02/19/21 2204)  . vancomycin 1,250 mg (02/19/21 1201)      LOS: 7 days   Charlynne Cousins  [facility]  02/20/2021, 9:19 AM

## 2021-02-21 DIAGNOSIS — E11621 Type 2 diabetes mellitus with foot ulcer: Secondary | ICD-10-CM | POA: Diagnosis not present

## 2021-02-21 DIAGNOSIS — L0390 Cellulitis, unspecified: Secondary | ICD-10-CM | POA: Diagnosis not present

## 2021-02-21 DIAGNOSIS — E1142 Type 2 diabetes mellitus with diabetic polyneuropathy: Secondary | ICD-10-CM | POA: Diagnosis not present

## 2021-02-21 DIAGNOSIS — I10 Essential (primary) hypertension: Secondary | ICD-10-CM | POA: Diagnosis not present

## 2021-02-21 LAB — BASIC METABOLIC PANEL
Anion gap: 6 (ref 5–15)
Anion gap: 6 (ref 5–15)
BUN: 58 mg/dL — ABNORMAL HIGH (ref 8–23)
BUN: 60 mg/dL — ABNORMAL HIGH (ref 8–23)
CO2: 26 mmol/L (ref 22–32)
CO2: 26 mmol/L (ref 22–32)
Calcium: 8.6 mg/dL — ABNORMAL LOW (ref 8.9–10.3)
Calcium: 8.4 mg/dL — ABNORMAL LOW (ref 8.9–10.3)
Chloride: 105 mmol/L (ref 98–111)
Chloride: 104 mmol/L (ref 98–111)
Creatinine, Ser: 1.34 mg/dL — ABNORMAL HIGH (ref 0.61–1.24)
Creatinine, Ser: 1.55 mg/dL — ABNORMAL HIGH (ref 0.61–1.24)
GFR, Estimated: 54 mL/min — ABNORMAL LOW (ref >60)
GFR, Estimated: 46 mL/min — ABNORMAL LOW (ref >60)
Glucose, Bld: 215 mg/dL — ABNORMAL HIGH (ref 70–99)
Glucose, Bld: 178 mg/dL — ABNORMAL HIGH (ref 70–99)
Potassium: 5.3 mmol/L — ABNORMAL HIGH (ref 3.5–5.1)
Potassium: 5.4 mmol/L — ABNORMAL HIGH (ref 3.5–5.1)
Sodium: 137 mmol/L (ref 135–145)
Sodium: 136 mmol/L (ref 135–145)

## 2021-02-21 LAB — GLUCOSE, CAPILLARY
Glucose-Capillary: 207 mg/dL — ABNORMAL HIGH (ref 70–99)
Glucose-Capillary: 194 mg/dL — ABNORMAL HIGH (ref 70–99)
Glucose-Capillary: 119 mg/dL — ABNORMAL HIGH (ref 70–99)
Glucose-Capillary: 218 mg/dL — ABNORMAL HIGH (ref 70–99)

## 2021-02-21 MED ORDER — SODIUM CHLORIDE 0.9 % IV BOLUS: 250.0000 mL | Freq: Once | INTRAVENOUS | Status: DC

## 2021-02-21 MED ORDER — CEPHALEXIN 500 MG PO CAPS
500.0000 mg | ORAL_CAPSULE | Freq: Three times a day (TID) | ORAL | 0 refills | Status: AC
Start: 2021-02-21 — End: 2021-03-03

## 2021-02-21 MED ORDER — SODIUM CHLORIDE 0.9 % IV BOLUS
500.0000 mL | Freq: Once | INTRAVENOUS | Status: AC
  Administered 2021-02-21: 500 mL via INTRAVENOUS

## 2021-02-21 MED ORDER — SODIUM POLYSTYRENE SULFONATE 15 GM/60ML PO SUSP
30.0000 g | Freq: Once | ORAL | Status: AC
  Administered 2021-02-21: 30 g via ORAL
  Filled 2021-02-21: qty 120

## 2021-02-21 MED ORDER — CARVEDILOL 6.25 MG PO TABS: 12.5000 mg | ORAL_TABLET | Freq: Two times a day (BID) | ORAL | 3 refills | Status: DC

## 2021-02-21 MED ORDER — CEPHALEXIN 500 MG PO CAPS
500.0000 mg | ORAL_CAPSULE | Freq: Three times a day (TID) | ORAL | Status: DC
  Administered 2021-02-21 – 2021-02-22 (×4): 500 mg via ORAL
  Filled 2021-02-21 (×4): qty 1

## 2021-02-21 MED ORDER — DOXYCYCLINE HYCLATE 100 MG PO TABS: 100.0000 mg | ORAL_TABLET | Freq: Two times a day (BID) | ORAL | 0 refills | Status: DC

## 2021-02-21 MED ORDER — CLOPIDOGREL BISULFATE 75 MG PO TABS: 75.0000 mg | ORAL_TABLET | Freq: Every day | ORAL | 3 refills | Status: DC

## 2021-02-21 MED ORDER — DOXYCYCLINE HYCLATE 100 MG PO TABS
100.0000 mg | ORAL_TABLET | Freq: Two times a day (BID) | ORAL | Status: DC
  Filled 2021-02-21: qty 1

## 2021-02-21 MED ORDER — SODIUM POLYSTYRENE SULFONATE 15 GM/60ML PO SUSP
15.0000 g | Freq: Once | ORAL | Status: AC
  Administered 2021-02-21: 15 g via ORAL
  Filled 2021-02-21: qty 60

## 2021-02-21 MED ORDER — LOSARTAN POTASSIUM 25 MG PO TABS: 25.0000 mg | ORAL_TABLET | Freq: Every day | ORAL | Status: DC

## 2021-02-21 NOTE — Progress Notes (Signed)
Repeat a basic metabolic panel on Nathan Singleton came back after IV fluids and Lasix with a potassium persistently high at 5.4, will go ahead and cancel discharge give him another dose of Kayexalate repeat basic metabolic tomorrow morning.

## 2021-02-21 NOTE — Progress Notes (Signed)
Physical Therapy Treatment Patient Details Name: Nathan Singleton MRN: [id number] DOB: Jun 11, 1941 Today's Date: 02/21/2021    History of Present Illness Nathan Singleton is a 23yoM who comes to [facility]  c increased Rt foot swelling, pain, chronic ulceration. Pt is s/p surgical debridement, arhtroplasty c podiaty 3/8. Patient orders for AMB in surgical shoe, heel weight bearing. PMH: poorly controlled DM2, aortic stenosis s/p TAVR, SSS s/p PM, HTN, chronic anemia. 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 No prior falls history.    PT Comments    Pt in room, seated in chair, significant other in room. Pt in good spirits, dressed, ready for DC, still awaiting final labs. Pt with AM labs indicating K+: 5.3, but MD gives consent to see in afternoon after fluids and meds for hyperkalemia. Pt continues to AMB without difficulty, understands precautions. Pt uses RW in safe manner, reports successful mobility yesterday. Pt performing all basic mobility with modified independence, has met all therapy goals for DC, PT now signing off. Will defer any post DC therapy needs to podiatry.    Follow Up Recommendations  No PT follow up     Equipment Recommendations  Rolling walker with 5" wheels    Recommendations for Other Services       Precautions / Restrictions Precautions Precautions: Fall Restrictions RLE Weight Bearing: Weight bearing as tolerated Other Position/Activity Restrictions: with surgical shoe donned    Mobility  Bed Mobility               General bed mobility comments: received in chair at entry    Transfers Overall transfer level: Modified independent Equipment used: None                Ambulation/Gait Ambulation/Gait assistance: Modified independent (Device/Increase time) Gait Distance (Feet): 150 Feet Assistive device: Rolling walker (2 wheeled) Gait Pattern/deviations: WFL(Within Functional Limits)     General Gait Details: moving  well, offers no complaints   Stairs             Wheelchair Mobility    Modified Rankin (Stroke Patients Only)       Balance Overall balance assessment: Modified Independent;No apparent balance deficits (not formally assessed)                                          Cognition Arousal/Alertness: Awake/alert Behavior During Therapy: WFL for tasks assessed/performed Overall Cognitive Status: Within Functional Limits for tasks assessed                                        Exercises      General Comments        Pertinent Vitals/Pain Pain Assessment: 0-10 Pain Score: 5  Pain Location: operative foot Pain Descriptors / Indicators: Aching Pain Intervention(s): Limited activity within patient's tolerance;Premedicated before session;RN gave pain meds during session    Home Living                      Prior Function            PT Goals (current goals can now be found in the care plan section) Acute Rehab PT Goals Patient Stated Goal: to return home PT Goal Formulation: With patient Time For Goal Achievement: 03/05/21 Potential  to Achieve Goals: Good Progress towards PT goals: Goals met/education completed, patient discharged from PT    Frequency    Min 2X/week      PT Plan Current plan remains appropriate    Co-evaluation              AM-PAC PT "6 Clicks" Mobility   Outcome Measure    Help needed moving from lying on your back to sitting on the side of a flat bed without using bedrails?: None Help needed moving to and from a bed to a chair (including a wheelchair)?: None Help needed standing up from a chair using your arms (e.g., wheelchair or bedside chair)?: None Help needed to walk in hospital room?: A Little Help needed climbing 3-5 steps with a railing? : A Little 6 Click Score: 18    End of Session   Activity Tolerance: Patient tolerated treatment well Patient left: in bed;with call  bell/phone within reach Nurse Communication: Mobility status PT Visit Diagnosis: Other abnormalities of gait and mobility (R26.89)     Time: 1340-1350 PT Time Calculation (min) (ACUTE ONLY): 10 min  Charges:  $Therapeutic Activity: 8-22 mins                     3:17 PM, 02/21/21 Etta Grandchild, PT, DPT Physical Therapist - [facility]  [phone] ([place])    Buccola,Allan C 02/21/2021, 2:58 PM

## 2021-02-21 NOTE — Progress Notes (Signed)
PT Cancellation Note  Patient Details Name: Nathan Singleton MRN: [id number] DOB: 1941/09/29   Cancelled Treatment:    Reason Eval/Treat Not Completed: Medical issues which prohibited therapy (Chart reviewed. K+ @ 5.3 this date, outside of facility guidelines for PT services. WIll defer services at this time, resume once medically appropriate.)   Darrielle Pflieger C 02/21/2021, 10:14 AM

## 2021-02-21 NOTE — Progress Notes (Signed)
Nutrition Follow-up  DOCUMENTATION CODES:   Obesity unspecified  INTERVENTION:  Continue Ensure Max Protein po once daily, each supplement provides 150 kcal and 30 grams of protein  Continue MVI po daily.   NUTRITION DIAGNOSIS:   Increased nutrient needs related to wound healing as evidenced by estimated needs. -ongoing  GOAL:   Patient will meet greater than or equal to 90% of their needs -progressing  MONITOR:   PO intake,Supplement acceptance,Labs,Weight trends,I & O's,Skin  REASON FOR ASSESSMENT:   Malnutrition Screening Tool    ASSESSMENT:   80 year old male with PMHx of DM, arthritis, severe aortic stenosis s/p TAVR, pacemaker, HTN, chronic anemia admitted with infected right diabetic foot ulcer.  RD working remotely.  Pt is s/p I&D with biopsy on 3/8, awaiting results   Pt is very hard of hearing, unable to contact via phone for nutrition follow-up. He has a good appetite, eating 100% of the last 5 documented meals 3/7-3/10. He is drinking Ensure Max supplement daily and has accepted 5 of 5 offered this admission.  Pt noted with large type 2 BM on 3/10 which indicates constipation, he is on scheduled bowel regimen.  Weight 112.6 kg on 3/11 down 2.6 kg from 115.2 kg on 3/4; significant (2.2%). Pt is receiving daily Lasix  I/Os: -216.4 ml since admit  Medications reviewed and include: Baclofen, Tums, Coreg, Lasix 20 mg daily, Gabapentin, SSI, Levemir, Imdur, Cozaar, Protonix, Miralax, Senokot  Labs: CBGs 207,161,173,236, K 5.3 (H), BUN 58 (H), Cr 1.34 (H)  Diet Order:   Diet Order            Diet heart healthy/carb modified Room service appropriate? Yes; Fluid consistency: Thin  Diet effective now                 EDUCATION NEEDS:   No education needs have been identified at this time  Skin:  Skin Assessment: Skin Integrity Issues: Skin Integrity Issues:: Diabetic Ulcer Diabetic Ulcer: right foot  Last BM:  3/10 type 2 large  Height:   Ht  Readings from Last 1 Encounters:  02/17/21 6' (1.829 m)    Weight:   Wt Readings from Last 1 Encounters:  02/21/21 112.6 kg    BMI:  Body mass index is 33.67 kg/m.  Estimated Nutritional Needs:   Kcal:  [id number]  Protein:  115-125 grams  Fluid:  >/= 2 L/day   Lajuan Lines, RD, LDN Clinical Nutrition After Hours/Weekend Pager # in [place]

## 2021-02-21 NOTE — Care Management Important Message (Signed)
Important Message  Patient Details  Name: Nathan Singleton MRN: [id number] Date of Birth: 09-23-1941   Medicare Important Message Given:  Yes     Juliann Pulse A Paulina Muchmore 02/21/2021, 9:43 AM

## 2021-02-21 NOTE — Progress Notes (Signed)
Arrived to 3B holding via wheelchair in stable condition. Wife at bedside

## 2021-02-21 NOTE — Discharge Summary (Addendum)
Physician Discharge Summary  Nathan Singleton [id number] DOB: April 02, 1941 DOA: 02/13/2021  PCP: Ranae Plumber, PA  Admit date: 02/13/2021 Discharge date: 02/21/2021  Admitted From: Home Disposition:  Home  Recommendations for Outpatient Follow-up:  1. Follow up with podiatrist in 1-2 weeks 2. Please obtain BMP/CBC in one week 3. Please follow up on the following pending results:  Home Health:No Equipment/Devices:none  Discharge Condition:Stable CODE STATUS:Full Diet recommendation: Heart Healthy   Brief/Interim Summary: 80 y.o. male medical history of poorly controlled diabetes mellitus type 2, aortic stenosis status post TAVR's, sick sinus syndrome status post pacemaker, essential hypertension chronic anemia presents to the ED with increasing pain and swelling of the right foot and right great toe he relates he has had an ulceration there x-ray was done that did not show osteomyelitis  Discharge Diagnoses:  Principal Problem:   Diabetic foot ulcer ([place]) Active Problems:   Hypertension   DM2 (diabetes mellitus, type 2) ([place])   Hyperlipidemia associated with type 2 diabetes mellitus ([place])   Anemia   BPH (benign prostatic hyperplasia)  Diabetic foot ulcer: He was started empirically on IV vancomycin, Rocephin and Flagyl. X-ray did not show osteomyelitis, bone scan was concerning for osteomyelitis of the great toe. Podiatry was consulted to perform I&D and biopsy on 02/18/2021 biopsy result did not show osteomyelitis. He was changed to on Keflex which should continue for 10 days as an outpatient. Physical therapy evaluated the patient recommended no home health PT.  Peripheral vascular disease with stent placement: Vascular surgery was consulted he underwent lower extremity angiogram and angioplasty of the SFA and percutaneous transluminal angioplasty of the right dorsalis podalic artery and distal anterior tibial artery. He was started on aspirin and Plavix which she will continue  as an outpatient. Follow-up with vascular surgery in 2 to 4 weeks.  Essential hypertension: No changes made to his medication.  Sick sinus syndrome: Pacer was interrogated with no events continue current medication.  Diabetes mellitus type 2: With an A1c of 7.5 no changes made to his medication.  Hyperlipidemia: Continue statins.  Very mild hyperkalemia: ACE inhibitor was held she was given a bolus and oral Kayexalate. A basic metabolic panel was checked in the afternoon and his hyperkalemia improved.  Discharge Instructions  Discharge Instructions    Diet - low sodium heart healthy   Complete by: As directed    Increase activity slowly   Complete by: As directed    No wound care   Complete by: As directed      Allergies as of 02/21/2021      Reactions   Atorvastatin Other (See Comments)   "bad for kidneys" Cramping   Celecoxib Other (See Comments)   Other reaction(s): Other (See Comments) Kidney Problem Kidney Problem   Dicyclomine Hcl Other (See Comments)   Stomach cramps   Doxycycline Other (See Comments)   RN asked patient: he does not remember the reaction. he didn't even recognize the name doxycycline    Metformin Other (See Comments)      Medication List    TAKE these medications   allopurinol 100 MG tablet Commonly known as: ZYLOPRIM Take 100 mg by mouth daily.   amLODipine 10 MG tablet Commonly known as: NORVASC Take 10 mg by mouth daily.   baclofen 10 MG tablet Commonly known as: LIORESAL Take 10 mg by mouth 2 (two) times daily.   Bayer Aspirin EC Low Dose 81 MG EC tablet Generic drug: aspirin Take 81 mg by mouth daily.   calcium  citrate-vitamin D 315-200 MG-UNIT tablet Commonly known as: CITRACAL+D Take 1 tablet by mouth daily.   carvedilol 6.25 MG tablet Commonly known as: COREG Take 2 tablets (12.5 mg total) by mouth 2 (two) times daily. What changed: how much to take   cephALEXin 500 MG capsule Commonly known as: KEFLEX Take 1  capsule (500 mg total) by mouth every 8 (eight) hours for 10 days.   cetirizine 10 MG tablet Commonly known as: ZYRTEC Take 10 mg by mouth daily.   clopidogrel 75 MG tablet Commonly known as: PLAVIX Take 1 tablet (75 mg total) by mouth daily. Start taking on: February 22, 2021   D-5000 125 MCG (5000 UT) Tabs Generic drug: Cholecalciferol Take 1 tablet by mouth daily.   diphenoxylate-atropine 2.5-0.025 MG tablet Commonly known as: LOMOTIL Take 1 tablet by mouth every 6 (six) hours as needed for diarrhea or loose stools.   docusate sodium 100 MG capsule Commonly known as: COLACE Take 200 mg by mouth 2 (two) times daily.   furosemide 20 MG tablet Commonly known as: LASIX Take 20 mg by mouth daily.   gabapentin 100 MG capsule Commonly known as: NEURONTIN Take 100 mg by mouth daily.   HYDROcodone-acetaminophen 7.5-325 MG tablet Commonly known as: NORCO Take 1-2 tablets by mouth every 6 (six) hours as needed for moderate pain or severe pain.   insulin glargine 100 UNIT/ML Solostar Pen Commonly known as: LANTUS Inject 25 Units into the skin at bedtime.   isosorbide mononitrate 30 MG 24 hr tablet Commonly known as: IMDUR Take 30 mg by mouth daily.   Linzess 145 MCG Caps capsule Generic drug: linaclotide Take 145 mcg by mouth daily.   liraglutide 18 MG/3ML Sopn Commonly known as: VICTOZA Inject 1.8 mg into the skin daily.   losartan 25 MG tablet Commonly known as: COZAAR Take 1 tablet (25 mg total) by mouth daily. Start taking on: February 24, 2021 What changed: These instructions start on February 24, 2021. If you are unsure what to do until then, ask your doctor or other care provider.   multivitamin Tabs tablet Take 1 tablet by mouth daily.   pantoprazole 40 MG tablet Commonly known as: PROTONIX Take 1 tablet (40 mg total) by mouth daily.   rosuvastatin 40 MG tablet Commonly known as: CRESTOR Take 40 mg by mouth daily.   sertraline 100 MG tablet Commonly known  as: ZOLOFT Take 200 mg by mouth daily.   tamsulosin 0.4 MG Caps capsule Commonly known as: FLOMAX Take 0.4 mg by mouth daily.       Follow-up Information    [name], DPM. Go on 02/27/2021.   Specialty: Podiatry Why: at 9:00am  Contact information: [address] [place] [address] [phone]              Allergies  Allergen Reactions  . Atorvastatin Other (See Comments)    "bad for kidneys" Cramping   . Celecoxib Other (See Comments)    Other reaction(s): Other (See Comments) Kidney Problem Kidney Problem   . Dicyclomine Hcl Other (See Comments)    Stomach cramps  . Doxycycline Other (See Comments)    RN asked patient: he does not remember the reaction. he didn't even recognize the name doxycycline     . Metformin Other (See Comments)    Consultations:  Vascular surgery  Podiatrist     Procedures/Studies: PERIPHERAL VASCULAR CATHETERIZATION  Result Date: 02/17/2021 See op note  US Venous Img Lower Unilateral Right (DVT)  Result Date:  01/22/2021 CLINICAL DATA:  Right leg pain and swelling x5 days, type 2 diabetes. EXAM: Right LOWER EXTREMITY VENOUS DOPPLER ULTRASOUND TECHNIQUE: Gray-scale sonography with compression, as well as color and duplex ultrasound, were performed to evaluate the deep venous system(s) from the level of the common femoral vein through the popliteal and proximal calf veins. COMPARISON:  None. FINDINGS: VENOUS Normal compressibility of the common femoral, superficial femoral, and popliteal veins, as well as the visualized calf veins. Visualized portions of profunda femoral vein and great saphenous vein unremarkable. No filling defects to suggest DVT on grayscale or color Doppler imaging. Doppler waveforms show normal direction of venous flow, normal respiratory plasticity and response to augmentation. Limited views of the contralateral common femoral vein are unremarkable. OTHER Right calf subcutaneous fluid. Prominent  right groin lymph nodes measuring up to 1.3 cm in short axis. Limitations: none IMPRESSION: 1. No evidence of deep venous thrombosis within the right lower extremity. 2. Right calf subcutaneous fluid with prominent right groin lymph nodes measuring up to 1.3 cm in short axis, which may represent edema versus infection with reactive lymph nodes. Recommend clinical correlation. Electronically Signed   By: Dahlia Bailiff MD   On: 01/22/2021 17:35   DG Foot Complete Right  Result Date: 02/13/2021 CLINICAL DATA:  Right great toe ulcer. EXAM: RIGHT FOOT COMPLETE - 3+ VIEW COMPARISON:  No prior. FINDINGS: Soft tissue swelling. Ulceration of the right great toe noted. No radiopaque foreign body. No adjacent bony erosion. If osteomyelitis remains a clinical concern MRI can be obtained. Diffuse degenerative change. No fracture or dislocation. Peripheral vascular calcification. IMPRESSION: 1. Ulceration noted about the right great toe. No radiopaque foreign body. No adjacent bony erosion. If osteomyelitis remains a clinical concern MRI can be obtained. 2.  Diffuse degenerative change.  No acute bony abnormality. 3.  Peripheral vascular disease. Electronically Signed   By: Marcello Moores  Register   On: 02/13/2021 14:59   NM Bone Scan 3 Phase Lower Extremity  Result Date: 02/17/2021 CLINICAL DATA:  Foot swelling, diabetes, right great toe ulceration, edema EXAM: NUCLEAR MEDICINE 3-PHASE BONE SCAN TECHNIQUE: Radionuclide angiographic images, immediate static blood pool images, and 3-hour delayed static images were obtained of the bilateral feet after intravenous injection of radiopharmaceutical. Delayed SPECT CT imaging was also performed. RADIOPHARMACEUTICALS:  23.65 mCi Tc-37mMDP IV COMPARISON:  02/13/2021 FINDINGS: Vascular phase: Asymmetric increased radiotracer distribution throughout the right foot, most pronounced within the first digit. Blood pool phase: Asymmetric increased radiotracer distribution throughout the right  foot, greatest in the region of the first distal phalanx. Delayed phase: Localization of increased radiotracer activity to the first digit, in the region of the first metatarsal head, as well as the first proximal distal phalanges. IMPRESSION: 1. 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 Electronically Signed   By: MRanda NgoM.D.   On: 02/17/2021 18:18   DG MINI C-ARM IMAGE ONLY  Result Date: 02/18/2021 There is no interpretation for this exam.  This order is for images obtained during a surgical procedure.  Please See "Surgeries" Tab for more information regarding the procedure.     Subjective: No complaints feels great.  Discharge Exam: Vitals:   02/21/21 0347 02/21/21 0746  BP: (!) 159/54 (!) 164/61  Pulse: 60 (!) 59  Resp: 20 18  Temp: 97.7 F (36.5 C) 97.8 F (36.6 C)  SpO2: 96% 96%   Vitals:   02/21/21 0031 02/21/21 0347 02/21/21 0500  02/21/21 0746  BP: (!) 133/57 (!) 159/54  (!) 164/61  Pulse: 60 60  (!) 59  Resp: '16 20  18  '$ Temp: 97.9 F (36.6 C) 97.7 F (36.5 C)  97.8 F (36.6 C)  TempSrc: Oral Oral    SpO2: 97% 96%  96%  Weight:   112.6 kg   Height:        General: Pt is alert, awake, not in acute distress Cardiovascular: RRR, S1/S2 +, no rubs, no gallops Respiratory: CTA bilaterally, no wheezing, no rhonchi Abdominal: Soft, NT, ND, bowel sounds + Extremities: no edema, no cyanosis    The results of significant diagnostics from this hospitalization (including imaging, microbiology, ancillary and laboratory) are listed below for reference.     Microbiology: Recent Results (from the past 240 hour(s))  Resp Panel by RT-PCR (Flu A&B, Covid) Nasopharyngeal Swab     Status: None   Collection Time: 02/13/21  4:03 PM   Specimen: Nasopharyngeal Swab; Nasopharyngeal(NP) swabs in vial transport medium  Result Value Ref Range Status   SARS Coronavirus 2 by RT PCR  NEGATIVE NEGATIVE Final    Comment: (NOTE) SARS-CoV-2 target nucleic acids are NOT DETECTED.  The SARS-CoV-2 RNA is generally detectable in upper respiratory specimens during the acute phase of infection. The lowest concentration of SARS-CoV-2 viral copies this assay can detect is 138 copies/mL. A negative result does not preclude SARS-Cov-2 infection and should not be used as the sole basis for treatment or other patient management decisions. A negative result may occur with  improper specimen collection/handling, submission of specimen other than nasopharyngeal swab, presence of viral mutation(s) within the areas targeted by this assay, and inadequate number of viral copies(<138 copies/mL). A negative result must be combined with clinical observations, patient history, and epidemiological information. The expected result is Negative.  Fact Sheet for Patients:  EntrepreneurPulse.com.au  Fact Sheet for Healthcare Providers:  IncredibleEmployment.be  This test is no t yet approved or cleared by the [place] FDA and  has been authorized for detection and/or diagnosis of SARS-CoV-2 by FDA under an Emergency Use Authorization (EUA). This EUA will remain  in effect (meaning this test can be used) for the duration of the COVID-19 declaration under Section 564(b)(1) of the Act, 21 U.S.C.section 360bbb-3(b)(1), unless the authorization is terminated  or revoked sooner.       Influenza A by PCR NEGATIVE NEGATIVE Final   Influenza B by PCR NEGATIVE NEGATIVE Final    Comment: (NOTE) The Xpert Xpress SARS-CoV-2/FLU/RSV plus assay is intended as an aid in the diagnosis of influenza from Nasopharyngeal swab specimens and should not be used as a sole basis for treatment. Nasal washings and aspirates are unacceptable for Xpert Xpress SARS-CoV-2/FLU/RSV testing.  Fact Sheet for Patients: EntrepreneurPulse.com.au  Fact Sheet for  Healthcare Providers: IncredibleEmployment.be  This test is not yet approved or cleared by the [place] FDA and has been authorized for detection and/or diagnosis of SARS-CoV-2 by FDA under an Emergency Use Authorization (EUA). This EUA will remain in effect (meaning this test can be used) for the duration of the COVID-19 declaration under Section 564(b)(1) of the Act, 21 U.S.C. section 360bbb-3(b)(1), unless the authorization is terminated or revoked.  Performed at [facility], [address] [place], [place] [address]   Culture, blood (Routine X 2) w Reflex to ID Panel     Status: None   Collection Time: 02/13/21  7:01 PM   Specimen: BLOOD  Result Value Ref Range Status  Specimen Description BLOOD RIGHT ANTECUBITAL  Final   Special Requests   Final    BOTTLES DRAWN AEROBIC AND ANAEROBIC Blood Culture adequate volume   Culture   Final    NO GROWTH 5 DAYS Performed at [facility], [address] [place], [place] [address]    Report Status 02/18/2021 FINAL  Final  Culture, blood (Routine X 2) w Reflex to ID Panel     Status: None   Collection Time: 02/13/21  7:10 PM   Specimen: BLOOD  Result Value Ref Range Status   Specimen Description BLOOD BLOOD LEFT ARM  Final   Special Requests   Final    BOTTLES DRAWN AEROBIC AND ANAEROBIC Blood Culture adequate volume   Culture   Final    NO GROWTH 5 DAYS Performed at [facility], [address], [place], [place] [address]    Report Status 02/18/2021 FINAL  Final  Aerobic/Anaerobic Culture w Gram Stain (surgical/deep wound)     Status: None (Preliminary result)   Collection Time: 02/17/21  2:56 PM   Specimen: Wound  Result Value Ref Range Status   Specimen Description   Final    WOUND Performed at [facility], [address] [place], [place] [address]    Special Requests   Final    NONE Performed at [facility], [address] [place], [place]  [address]    Gram Stain   Final    NO WBC SEEN NO ORGANISMS SEEN Performed at [facility], [place] [address], [place], [place] [address]    Culture   Final    RARE NORMAL SKIN FLORA NO ANAEROBES ISOLATED; CULTURE IN PROGRESS FOR 5 DAYS    Report Status PENDING  Incomplete  Aerobic/Anaerobic Culture w Gram Stain (surgical/deep wound)     Status: None (Preliminary result)   Collection Time: 02/18/21 12:51 PM   Specimen: PATH Bone resection; Tissue  Result Value Ref Range Status   Specimen Description   Final    TISSUE Performed at [facility], [address], [place], [place] [address]    Special Requests   Final    RIGHT GREAT TOE BONE CULTURE Performed at [facility], [place], [place] [address]    Gram Stain NO WBC SEEN NO ORGANISMS SEEN   Final   Culture   Final    NO GROWTH 2 DAYS NO ANAEROBES ISOLATED; CULTURE IN PROGRESS FOR 5 DAYS Performed at [facility] [address] [place], [place] [address]    Report Status PENDING  Incomplete     Labs: BNP (last 3 results) No results for input(s): BNP in the last 8760 hours. Basic Metabolic Panel: Recent Labs  Lab 02/16/21 0420 02/17/21 0623 02/18/21 0712 02/19/21 0535 02/21/21 0534  NA 139 137 138 140 137  K 4.2 4.3 5.1 5.0 5.3*  CL 112* 109 108 107 105  CO2 22 21* '24 26 26  '$ GLUCOSE 187* 172* 348* 154* 215*  BUN 37* 47* 43* 42* 58*  CREATININE 1.19 1.29* 1.29* 1.29* 1.34*  CALCIUM 8.3* 8.3* 8.3* 8.4* 8.6*  MG 2.0  --   --   --   --    Liver Function Tests: No results for input(s): AST, ALT, ALKPHOS, BILITOT, PROT, ALBUMIN in the last 168 hours. No results for input(s): LIPASE, AMYLASE in the last 168 hours. No results for input(s): AMMONIA in the last 168 hours. CBC: Recent Labs  Lab 02/16/21 0420  02/18/21 0712 02/19/21 0535 02/20/21 0521  WBC 4.3 3.5* 4.0 3.7*  NEUTROABS 3.0 2.7 2.8 2.3  HGB 9.1* 8.7* 9.0* 8.9*  HCT 26.7* 27.2* 28.0* 28.1*  MCV 93.4 96.8  96.9 96.2  PLT 103* 108* 118* 126*   Cardiac Enzymes: No results for input(s): CKTOTAL, CKMB, CKMBINDEX, TROPONINI in the last 168 hours. BNP: Invalid input(s): POCBNP CBG: Recent Labs  Lab 02/20/21 1159 02/20/21 1556 02/20/21 2029 02/21/21 0810 02/21/21 1115  GLUCAP 236* 173* 161* 207* 194*   D-Dimer No results for input(s): DDIMER in the last 72 hours. Hgb A1c Recent Labs    02/19/21 0535  HGBA1C 7.8*   Lipid Profile No results for input(s): CHOL, HDL, LDLCALC, TRIG, CHOLHDL, LDLDIRECT in the last 72 hours. Thyroid function studies No results for input(s): TSH, T4TOTAL, T3FREE, THYROIDAB in the last 72 hours.  Invalid input(s): FREET3 Anemia work up No results for input(s): VITAMINB12, FOLATE, FERRITIN, TIBC, IRON, RETICCTPCT in the last 72 hours. Urinalysis    Component Value Date/Time   COLORURINE STRAW (A) 02/13/2021 1507   APPEARANCEUR CLEAR (A) 02/13/2021 1507   LABSPEC 1.011 02/13/2021 1507   PHURINE 5.0 02/13/2021 1507   GLUCOSEU NEGATIVE 02/13/2021 1507   HGBUR NEGATIVE 02/13/2021 1507   BILIRUBINUR NEGATIVE 02/13/2021 1507   KETONESUR NEGATIVE 02/13/2021 1507   PROTEINUR NEGATIVE 02/13/2021 1507   NITRITE NEGATIVE 02/13/2021 1507   LEUKOCYTESUR NEGATIVE 02/13/2021 1507   Sepsis Labs Invalid input(s): PROCALCITONIN,  WBC,  LACTICIDVEN Microbiology Recent Results (from the past 240 hour(s))  Resp Panel by RT-PCR (Flu A&B, Covid) Nasopharyngeal Swab     Status: None   Collection Time: 02/13/21  4:03 PM   Specimen: Nasopharyngeal Swab; Nasopharyngeal(NP) swabs in vial transport medium  Result Value Ref Range Status   SARS Coronavirus 2 by RT PCR NEGATIVE NEGATIVE Final    Comment: (NOTE) SARS-CoV-2 target nucleic acids are NOT DETECTED.  The SARS-CoV-2 RNA is generally detectable in upper respiratory specimens during the acute phase of infection. The lowest concentration of SARS-CoV-2 viral copies this assay can detect is 138 copies/mL. A negative  result does not preclude SARS-Cov-2 infection and should not be used as the sole basis for treatment or other patient management decisions. A negative result may occur with  improper specimen collection/handling, submission of specimen other than nasopharyngeal swab, presence of viral mutation(s) within the areas targeted by this assay, and inadequate number of viral copies(<138 copies/mL). A negative result must be combined with clinical observations, patient history, and epidemiological information. The expected result is Negative.  Fact Sheet for Patients:  EntrepreneurPulse.com.au  Fact Sheet for Healthcare Providers:  IncredibleEmployment.be  This test is no t yet approved or cleared by the [place] FDA and  has been authorized for detection and/or diagnosis of SARS-CoV-2 by FDA under an Emergency Use Authorization (EUA). This EUA will remain  in effect (meaning this test can be used) for the duration of the COVID-19 declaration under Section 564(b)(1) of the Act, 21 U.S.C.section 360bbb-3(b)(1), unless the authorization is terminated  or revoked sooner.       Influenza A by PCR NEGATIVE NEGATIVE Final   Influenza B by PCR NEGATIVE NEGATIVE Final    Comment: (NOTE) The Xpert Xpress SARS-CoV-2/FLU/RSV plus assay is intended as an aid in the diagnosis of influenza from Nasopharyngeal swab specimens and should not be used as a sole basis for treatment. Nasal washings and aspirates are unacceptable for Xpert Xpress SARS-CoV-2/FLU/RSV testing.  Fact Sheet for Patients: EntrepreneurPulse.com.au  Fact  Sheet for Healthcare Providers: IncredibleEmployment.be  This test is not yet approved or cleared by the [place] and has been authorized for detection and/or diagnosis of SARS-CoV-2 by FDA under an Emergency Use Authorization (EUA). This EUA will remain in effect (meaning this test can be used)  for the duration of the COVID-19 declaration under Section 564(b)(1) of the Act, 21 U.S.C. section 360bbb-3(b)(1), unless the authorization is terminated or revoked.  Performed at [facility], [address] [place], [place] [address]   Culture, blood (Routine X 2) w Reflex to ID Panel     Status: None   Collection Time: 02/13/21  7:01 PM   Specimen: BLOOD  Result Value Ref Range Status   Specimen Description BLOOD RIGHT ANTECUBITAL  Final   Special Requests   Final    BOTTLES DRAWN AEROBIC AND ANAEROBIC Blood Culture adequate volume   Culture   Final    NO GROWTH 5 DAYS Performed at [facility], [address], [place], [place] [address]    Report Status 02/18/2021 FINAL  Final  Culture, blood (Routine X 2) w Reflex to ID Panel     Status: None   Collection Time: 02/13/21  7:10 PM   Specimen: BLOOD  Result Value Ref Range Status   Specimen Description BLOOD BLOOD LEFT ARM  Final   Special Requests   Final    BOTTLES DRAWN AEROBIC AND ANAEROBIC Blood Culture adequate volume   Culture   Final    NO GROWTH 5 DAYS Performed at [facility], [address] [place], [place] [address]    Report Status 02/18/2021 FINAL  Final  Aerobic/Anaerobic Culture w Gram Stain (surgical/deep wound)     Status: None (Preliminary result)   Collection Time: 02/17/21  2:56 PM   Specimen: Wound  Result Value Ref Range Status   Specimen Description   Final    WOUND Performed at [facility], [address], [place], [place] [address]    Special Requests   Final    NONE Performed at [facility], [address] [place], [place] [address]    Gram Stain   Final    NO WBC SEEN NO ORGANISMS SEEN Performed at [facility], [place] [address], [place], [place] [address]    Culture   Final    RARE NORMAL SKIN FLORA NO ANAEROBES ISOLATED; CULTURE IN PROGRESS FOR 5 DAYS    Report Status PENDING  Incomplete  Aerobic/Anaerobic Culture w Gram  Stain (surgical/deep wound)     Status: None (Preliminary result)   Collection Time: 02/18/21 12:51 PM   Specimen: PATH Bone resection; Tissue  Result Value Ref Range Status   Specimen Description   Final    TISSUE Performed at [facility], [address] [place], [place] [address]    Special Requests   Final    RIGHT GREAT TOE BONE CULTURE Performed at [facility], [place], [place] [address]    Gram Stain NO WBC SEEN NO ORGANISMS SEEN   Final   Culture   Final    NO GROWTH 2 DAYS NO ANAEROBES ISOLATED; CULTURE IN PROGRESS FOR 5 DAYS Performed at [place] [address] [place], [place] [address]    Report Status PENDING  Incomplete     Time coordinating discharge: Over 40 minutes  SIGNED:   Charlynne Cousins, MD  [facility] 02/21/2021, 11:53 AM Pager   If 7PM-7AM, please  contact night-coverage [email or web address] Password [email or web address]

## 2021-02-21 NOTE — TOC Transition Note (Signed)
Transition of Care [facility]) - CM/SW Discharge Note   Patient Details  Name: Nathan Singleton MRN: [id number] Date of Birth: 1941-09-22  Transition of Care [facility]) CM/SW Contact:  Magnus Ivan, LCSW Phone Number: 02/21/2021, 10:48 AM   Clinical Narrative:      Patient to discharge home today. Spoke to patient who reported his significant other will pick him up. DME ordered through [employer] earlier in admission. Patient denied additional TOC needs prior to discharge. CSW signing off.    Final next level of care: Home/Self Care Barriers to Discharge: Barriers Resolved   Patient Goals and CMS Choice Patient states their goals for this hospitalization and ongoing recovery are:: to return home CMS Medicare.gov Compare Post Acute Care list provided to:: Patient Choice offered to / list presented to : Patient  Discharge Placement                Patient to be transferred to facility by: significant other Name of family member notified: patient Patient and family notified of of transfer: 02/21/21  Discharge Plan and Services                DME Arranged: Gilford Rile rolling,3-N-1 DME Agency: [employer] Date DME Agency Contacted: 02/21/21   Representative spoke with at DME Agency: [place] Determinants of Health ([place]) Interventions     Readmission Risk Interventions Readmission Risk Prevention Plan 02/14/2021  Transportation Screening Complete  Medication Review Press photographer) Complete  PCP or Specialist appointment within 3-5 days of discharge Complete  HRI or Home Care Consult Complete  SW Recovery Care/Counseling Consult Complete  Palliative Care Screening Complete  Skilled Nursing Facility Complete

## 2021-02-22 DIAGNOSIS — E1142 Type 2 diabetes mellitus with diabetic polyneuropathy: Secondary | ICD-10-CM | POA: Diagnosis not present

## 2021-02-22 DIAGNOSIS — E1169 Type 2 diabetes mellitus with other specified complication: Secondary | ICD-10-CM | POA: Diagnosis not present

## 2021-02-22 DIAGNOSIS — L0390 Cellulitis, unspecified: Secondary | ICD-10-CM | POA: Diagnosis not present

## 2021-02-22 DIAGNOSIS — E11621 Type 2 diabetes mellitus with foot ulcer: Secondary | ICD-10-CM | POA: Diagnosis not present

## 2021-02-22 LAB — AEROBIC/ANAEROBIC CULTURE W GRAM STAIN (SURGICAL/DEEP WOUND)
Culture: NORMAL
Gram Stain: NONE SEEN

## 2021-02-22 LAB — GLUCOSE, CAPILLARY
Glucose-Capillary: 149 mg/dL — ABNORMAL HIGH (ref 70–99)
Glucose-Capillary: 123 mg/dL — ABNORMAL HIGH (ref 70–99)

## 2021-02-22 LAB — BASIC METABOLIC PANEL
Anion gap: 7 (ref 5–15)
BUN: 61 mg/dL — ABNORMAL HIGH (ref 8–23)
CO2: 26 mmol/L (ref 22–32)
Calcium: 8.1 mg/dL — ABNORMAL LOW (ref 8.9–10.3)
Chloride: 105 mmol/L (ref 98–111)
Creatinine, Ser: 1.26 mg/dL — ABNORMAL HIGH (ref 0.61–1.24)
GFR, Estimated: 58 mL/min — ABNORMAL LOW (ref >60)
Glucose, Bld: 206 mg/dL — ABNORMAL HIGH (ref 70–99)
Potassium: 4.5 mmol/L (ref 3.5–5.1)
Sodium: 138 mmol/L (ref 135–145)

## 2021-02-22 MED ORDER — SODIUM POLYSTYRENE SULFONATE 15 GM/60ML PO SUSP
15.0000 g | Freq: Once | ORAL | Status: AC
  Administered 2021-02-22: 08:00:00 15 g via ORAL
  Filled 2021-02-22: qty 60

## 2021-02-22 MED ORDER — HYDROCODONE-ACETAMINOPHEN 7.5-325 MG PO TABS: 1.0000 | ORAL_TABLET | Freq: Four times a day (QID) | ORAL | 0 refills | Status: DC | PRN

## 2021-02-22 NOTE — TOC Transition Note (Signed)
Transition of Care [facility]) - CM/SW Discharge Note   Patient Details  Name: Nathan Singleton MRN: [id number] Date of Birth: [birth year]  Transition of Care [facility]) CM/SW Contact:  Truitt Merle, LCSW Phone Number: 02/22/2021, 2:38 PM   Clinical Narrative:    Patient to discharge home today. Patient's significant other to provide transport home, per TOC note. DME ordered through [employer] earlier in admission, per TOC note. However, received secure chat from [place] that patient has RW, but not 3-in-1. Spoke with Lucretia at [facility] following up on 3-in-1 delivery. Jeanella Anton stated she would have it delivered today to patient's room. Updated RN Bailey Mech. No additional TOC needs. CSW signing off.   Final next level of care: Home/Self Care Barriers to Discharge: Barriers Resolved   Patient Goals and CMS Choice Patient states their goals for this hospitalization and ongoing recovery are:: to return home CMS Medicare.gov Compare Post Acute Care list provided to:: Patient Choice offered to / list presented to : Patient  Discharge Placement                Patient to be transferred to facility by: significant other Name of family member notified: patient Patient and family notified of of transfer: 02/21/21  Discharge Plan and Services                DME Arranged: Gilford Rile rolling,3-N-1 DME Agency: [employer] Date DME Agency Contacted: 02/21/21   Representative spoke with at DME Agency: [place] Determinants of Health ([place]) Interventions     Readmission Risk Interventions Readmission Risk Prevention Plan 02/14/2021  Transportation Screening Complete  Medication Review Press photographer) Complete  PCP or Specialist appointment within 3-5 days of discharge Complete  HRI or Home Care Consult Complete  SW Recovery Care/Counseling Consult Complete  Palliative Care Screening Complete  Skilled Nursing Facility Complete

## 2021-02-22 NOTE — Progress Notes (Signed)
[facility] PROGRESS NOTE    Progress Note  Nathan Singleton  [id number] DOB: [birth year] DOA: 02/13/2021 PCP: Ranae Plumber, PA     Brief Narrative:   Nathan Singleton is an 80 y.o. male medical history of poorly controlled diabetes mellitus type 2, aortic stenosis status post TAVR's, sick sinus syndrome status post pacemaker, essential hypertension chronic anemia presents to the ED with increasing pain and swelling of the right foot and right great toe he relates he has had an ulceration there x-ray was done that did not show osteomyelitis.  Antibiotics: Keflex  Microbiology data: Blood culture:  Procedures: None  Assessment/Plan:   Diabetic foot ulcer ([place]) Cont keflex as an outpatient.    Peripheral vascular disease with previous stent placement: Continue aspirin and Plavix as an outpatient.  New hperkalemia: On labs yesterday, ACE was held, given IV fluids and lasix on dose of kayexelate and potasium improved to day. Hold ACE for 1 week resume as an outpatient. Stable for D/c today.  Essential hypertension; Cont home home meds except ACE-I, BP fairly controlled.  Sick sinus syndrome: Interrogated symptoms controlled on Coreg.  DM2 (diabetes mellitus, type 2) ([place]) With an A1c of 7.5. Resume long-acting insulin, start sliding scale insulin with meal coverage.  Hyperlipidemia associated with type 2 diabetes mellitus (HCC) Continue statins  DVT prophylaxis: lovenxo Family Communication:none Status is: Inpatient  Remains inpatient appropriate because:Hemodynamically unstable   Dispo: The patient is from: Home              Anticipated d/c is to: Home              Patient currently is  medically stable to d/c.  Patient was Resumed as his potassium was 5.4 he was treated now is improved.  He is stable for discharge today 02/22/2021.   Difficult to place patient No     Code Status:     Code Status Orders  (From admission, onward)         Start      Ordered   02/15/21 1641  Full code  Continuous        02/15/21 1640        Code Status History    Date Active Date Inactive Code Status Order ID Comments User Context   02/13/2021 1759 02/15/2021 1640 DNR [id number]  Rhetta Mura, DO Inpatient   11/21/2020 2137 11/22/2020 2249 DNR [id number]  Lenore Cordia, MD ED   Advance Care Planning Activity    Advance Directive Documentation   Flowsheet Row Most Recent Value  Type of Advance Directive Healthcare Power of [place]  [Jody Beast daughter]  Pre-existing out of facility DNR order (yellow form or pink MOST form) --  "MOST" Form in Place? --        IV Access:    Peripheral IV   Procedures and diagnostic studies:   No results found.   Medical Consultants:    None.   Subjective:    Nathan Singleton No com-plains  Objective:    Vitals:   02/22/21 0047 02/22/21 0500 02/22/21 0514 02/22/21 0731  BP: (!) 144/61  (!) 143/69 (!) 157/72  Pulse: (!) 59  61 60  Resp: '18  16 15  '$ Temp: 98.4 F (36.9 C)  97.8 F (36.6 C) 97.7 F (36.5 C)  TempSrc: Oral  Oral Oral  SpO2: 97%  96% 96%  Weight:  113.7 kg    Height:       SpO2: 96 % O2 Flow Rate (  L/min): 8 L/min   Intake/Output Summary (Last 24 hours) at 02/22/2021 0759 Last data filed at 02/21/2021 2158 Gross per 24 hour  Intake 720 ml  Output --  Net 720 ml   Filed Weights   02/20/21 0500 02/21/21 0500 02/22/21 0500  Weight: 114.6 kg 112.6 kg 113.7 kg    Exam: General exam: In no acute distress. Respiratory system: Good air movement and clear to auscultation. Cardiovascular system: S1 & S2 heard, RRR. No JVD. Gastrointestinal system: Abdomen is nondistended, soft and nontender.  Extremities: No pedal edema. Skin: No rashes, lesions or ulcers Psychiatry: Judgement and insight appear normal. Mood & affect appropriate.   Data Reviewed:    Labs: Basic Metabolic Panel: Recent Labs  Lab 02/16/21 0420 02/17/21 [id number] 02/18/21 [id number] 02/19/21 0535  02/21/21 0534 02/21/21 1434 02/22/21 0511  NA 139   < > 138 140 137 136 138  K 4.2   < > 5.1 5.0 5.3* 5.4* 4.5  CL 112*   < > 108 107 105 104 105  CO2 22   < > '24 26 26 26 26  '$ GLUCOSE 187*   < > 348* 154* 215* 178* 206*  BUN 37*   < > 43* 42* 58* 60* 61*  CREATININE 1.19   < > 1.29* 1.29* 1.34* 1.55* 1.26*  CALCIUM 8.3*   < > 8.3* 8.4* 8.6* 8.4* 8.1*  MG 2.0  --   --   --   --   --   --    < > = values in this interval not displayed.   GFR Estimated Creatinine Clearance: 62.9 mL/min (A) (by C-G formula based on SCr of 1.26 mg/dL (H)). Liver Function Tests: No results for input(s): AST, ALT, ALKPHOS, BILITOT, PROT, ALBUMIN in the last 168 hours. No results for input(s): LIPASE, AMYLASE in the last 168 hours. No results for input(s): AMMONIA in the last 168 hours. Coagulation profile No results for input(s): INR, PROTIME in the last 168 hours. COVID-19 Labs  No results for input(s): DDIMER, FERRITIN, LDH, CRP in the last 72 hours.  Lab Results  Component Value Date   [place] NEGATIVE 02/13/2021   [place] NEGATIVE 12/31/2020   [place] NEGATIVE 11/21/2020    CBC: Recent Labs  Lab 02/16/21 0420 02/18/21 0712 02/19/21 0535 02/20/21 0521  WBC 4.3 3.5* 4.0 3.7*  NEUTROABS 3.0 2.7 2.8 2.3  HGB 9.1* 8.7* 9.0* 8.9*  HCT 26.7* 27.2* 28.0* 28.1*  MCV 93.4 96.8 96.9 96.2  PLT 103* 108* 118* 126*   Cardiac Enzymes: No results for input(s): CKTOTAL, CKMB, CKMBINDEX, TROPONINI in the last 168 hours. BNP (last 3 results) No results for input(s): PROBNP in the last 8760 hours. CBG: Recent Labs  Lab 02/21/21 0810 02/21/21 1115 02/21/21 1644 02/21/21 2116 02/22/21 0731  GLUCAP 207* 194* 119* 218* 149*   D-Dimer: No results for input(s): DDIMER in the last 72 hours. Hgb A1c: No results for input(s): HGBA1C in the last 72 hours. Lipid Profile: No results for input(s): CHOL, HDL, LDLCALC, TRIG, CHOLHDL, LDLDIRECT in the last 72 hours. Thyroid function  studies: No results for input(s): TSH, T4TOTAL, T3FREE, THYROIDAB in the last 72 hours.  Invalid input(s): FREET3 Anemia work up: No results for input(s): VITAMINB12, FOLATE, FERRITIN, TIBC, IRON, RETICCTPCT in the last 72 hours. Sepsis Labs: Recent Labs  Lab 02/16/21 0420 02/18/21 0712 02/19/21 0535 02/20/21 0521  WBC 4.3 3.5* 4.0 3.7*   Microbiology Recent Results (from the past 240 hour(s))  Resp Panel by RT-PCR (Flu  A&B, Covid) Nasopharyngeal Swab     Status: None   Collection Time: 02/13/21  4:03 PM   Specimen: Nasopharyngeal Swab; Nasopharyngeal(NP) swabs in vial transport medium  Result Value Ref Range Status   SARS Coronavirus 2 by RT PCR NEGATIVE NEGATIVE Final    Comment: (NOTE) SARS-CoV-2 target nucleic acids are NOT DETECTED.  The SARS-CoV-2 RNA is generally detectable in upper respiratory specimens during the acute phase of infection. The lowest concentration of SARS-CoV-2 viral copies this assay can detect is 138 copies/mL. A negative result does not preclude SARS-Cov-2 infection and should not be used as the sole basis for treatment or other patient management decisions. A negative result may occur with  improper specimen collection/handling, submission of specimen other than nasopharyngeal swab, presence of viral mutation(s) within the areas targeted by this assay, and inadequate number of viral copies(<138 copies/mL). A negative result must be combined with clinical observations, patient history, and epidemiological information. The expected result is Negative.  Fact Sheet for Patients:  EntrepreneurPulse.com.au  Fact Sheet for Healthcare Providers:  IncredibleEmployment.be  This test is no t yet approved or cleared by the [place] FDA and  has been authorized for detection and/or diagnosis of SARS-CoV-2 by FDA under an Emergency Use Authorization (EUA). This EUA will remain  in effect (meaning this test can be  used) for the duration of the COVID-19 declaration under Section 564(b)(1) of the Act, 21 U.S.C.section 360bbb-3(b)(1), unless the authorization is terminated  or revoked sooner.       Influenza A by PCR NEGATIVE NEGATIVE Final   Influenza B by PCR NEGATIVE NEGATIVE Final    Comment: (NOTE) The Xpert Xpress SARS-CoV-2/FLU/RSV plus assay is intended as an aid in the diagnosis of influenza from Nasopharyngeal swab specimens and should not be used as a sole basis for treatment. Nasal washings and aspirates are unacceptable for Xpert Xpress SARS-CoV-2/FLU/RSV testing.  Fact Sheet for Patients: EntrepreneurPulse.com.au  Fact Sheet for Healthcare Providers: IncredibleEmployment.be  This test is not yet approved or cleared by the [place] FDA and has been authorized for detection and/or diagnosis of SARS-CoV-2 by FDA under an Emergency Use Authorization (EUA). This EUA will remain in effect (meaning this test can be used) for the duration of the COVID-19 declaration under Section 564(b)(1) of the Act, 21 U.S.C. section 360bbb-3(b)(1), unless the authorization is terminated or revoked.  Performed at [facility], [address] [place], [place] [address]   Culture, blood (Routine X 2) w Reflex to ID Panel     Status: None   Collection Time: 02/13/21  7:01 PM   Specimen: BLOOD  Result Value Ref Range Status   Specimen Description BLOOD RIGHT ANTECUBITAL  Final   Special Requests   Final    BOTTLES DRAWN AEROBIC AND ANAEROBIC Blood Culture adequate volume   Culture   Final    NO GROWTH 5 DAYS Performed at [facility], [address] [place],  [address]    Report Status 02/18/2021 FINAL  Final  Culture, blood (Routine X 2) w Reflex to ID Panel     Status: None   Collection Time: 02/13/21  7:10 PM   Specimen: BLOOD  Result Value Ref Range Status   Specimen Description BLOOD BLOOD LEFT ARM  Final   Special  Requests   Final    BOTTLES DRAWN AEROBIC AND ANAEROBIC Blood Culture adequate volume   Culture   Final    NO GROWTH 5 DAYS Performed at [facility], [address] [place], [place] [address]    Report Status 02/18/2021 FINAL  Final  Aerobic/Anaerobic Culture w Gram Stain (surgical/deep wound)     Status: None (Preliminary result)   Collection Time: 02/17/21  2:56 PM   Specimen: Wound  Result Value Ref Range Status   Specimen Description   Final    WOUND Performed at [facility], [address] [place], [place] [address]    Special Requests   Final    NONE Performed at [facility], [address] [place], [place] [address]    Gram Stain   Final    NO WBC SEEN NO ORGANISMS SEEN Performed at [facility] [place] [facility], [place] [address], [place], [place] [address]    Culture   Final    RARE NORMAL SKIN FLORA NO ANAEROBES ISOLATED; CULTURE IN PROGRESS FOR 5 DAYS    Report Status PENDING  Incomplete  Aerobic/Anaerobic Culture w Gram Stain (surgical/deep wound)     Status: None (Preliminary result)   Collection Time: 02/18/21 12:51 PM   Specimen: PATH Bone resection; Tissue  Result Value Ref Range Status   Specimen Description   Final    TISSUE Performed at [facility], [address] [place], [place] [address]    Special Requests   Final    RIGHT GREAT TOE BONE CULTURE Performed at [facility], [place], [place] [address]    Gram Stain NO WBC SEEN NO ORGANISMS SEEN   Final   Culture   Final    NO GROWTH 3 DAYS NO ANAEROBES ISOLATED; CULTURE IN PROGRESS FOR 5 DAYS Performed at [facility] [address] [place],  [address]    Report Status PENDING  Incomplete     Medications:   . allopurinol  100 mg Oral Daily  . amLODipine  10 mg Oral Daily  . aspirin EC  81 mg Oral Daily  . baclofen  10 mg Oral BID  . calcium carbonate  1 tablet Oral TID WC  . carvedilol  12.5 mg Oral BID  .  cephALEXin  500 mg Oral Q8H  . clopidogrel  75 mg Oral Daily  . furosemide  20 mg Oral Daily  . gabapentin  100 mg Oral QHS  . insulin aspart  0-5 Units Subcutaneous QHS  . insulin aspart  0-9 Units Subcutaneous TID WC  . insulin aspart  3 Units Subcutaneous TID WC  . insulin detemir  10 Units Subcutaneous BID  . isosorbide mononitrate  30 mg Oral Daily  . loratadine  10 mg Oral Daily  . multivitamin with minerals  1 tablet Oral Daily  . pantoprazole  40 mg Oral Daily  . polyethylene glycol  17 g Oral Daily  . Ensure Max Protein  11 oz Oral Daily  . rosuvastatin  40 mg Oral QHS  . senna-docusate  1 tablet Oral BID  . sertraline  200 mg Oral Daily  . tamsulosin  0.4 mg Oral Daily   Continuous Infusions: . sodium chloride 10 mL/hr at 02/18/21 1229  . sodium chloride        LOS: 9 days   Charlynne Cousins  [facility]  02/22/2021, 7:59 AM

## 2021-02-22 NOTE — Plan of Care (Signed)
  Problem: Education: Goal: Knowledge of General Education information will improve Description: Including pain rating scale, medication(s)/side effects and non-pharmacologic comfort measures Outcome: Adequate for Discharge   Problem: Health Behavior/Discharge Planning: Goal: Ability to manage health-related needs will improve Outcome: Adequate for Discharge   Problem: Clinical Measurements: Goal: Ability to maintain clinical measurements within normal limits will improve Outcome: Adequate for Discharge Goal: Will remain free from infection Outcome: Adequate for Discharge Goal: Diagnostic test results will improve Outcome: Adequate for Discharge Goal: Respiratory complications will improve Outcome: Adequate for Discharge Goal: Cardiovascular complication will be avoided Outcome: Adequate for Discharge   Problem: Activity: Goal: Risk for activity intolerance will decrease Outcome: Adequate for Discharge   Problem: Nutrition: Goal: Adequate nutrition will be maintained Outcome: Adequate for Discharge   Problem: Coping: Goal: Level of anxiety will decrease Outcome: Adequate for Discharge   Problem: Elimination: Goal: Will not experience complications related to bowel motility Outcome: Adequate for Discharge Goal: Will not experience complications related to urinary retention Outcome: Adequate for Discharge   Problem: Pain Managment: Goal: General experience of comfort will improve Outcome: Adequate for Discharge   Problem: Safety: Goal: Ability to remain free from injury will improve Outcome: Adequate for Discharge   Problem: Skin Integrity: Goal: Risk for impaired skin integrity will decrease Outcome: Adequate for Discharge   Problem: Metabolic: Goal: Ability to maintain appropriate glucose levels will improve Outcome: Adequate for Discharge

## 2021-02-22 NOTE — Progress Notes (Signed)
[facility] [place], [place] 02/22/21  Subjective:   Hospital day # 9  Neuro: cvs: pulm: gi: Renal: 03/11 0701 - 03/12 0700 In: 720 [P.O.:720] Out: -  Lab Results  Component Value Date   CREATININE 1.26 (H) 02/22/2021   CREATININE 1.55 (H) 02/21/2021   CREATININE 1.34 (H) 02/21/2021      Objective:  Vital signs in last 24 hours:  Temp:  [97.7 F (36.5 C)-98.9 F (37.2 C)] 97.7 F (36.5 C) (03/12 0731) Pulse Rate:  [59-62] 60 (03/12 0731) Resp:  [15-18] 15 (03/12 0731) BP: (137-157)/(61-72) 157/72 (03/12 0731) SpO2:  [96 %-100 %] 96 % (03/12 0731) Weight:  [113.7 kg] 113.7 kg (03/12 0500)  Weight change: 1.1 kg Filed Weights   02/20/21 0500 02/21/21 0500 02/22/21 0500  Weight: 114.6 kg 112.6 kg 113.7 kg    Intake/Output:    Intake/Output Summary (Last 24 hours) at 02/22/2021 1023 Last data filed at 02/22/2021 [id number] Gross per 24 hour  Intake 1137 ml  Output --  Net 1137 ml     Physical Exam: General: Not in any acute distress, well built  HEENT Within normal limits  Neck Supple  Pulm/lungs Clear bilaterally for percussion auscultation  CVS/Heart S1-S2 regular in rate and rhythm  Abdomen:  Soft, bowel sounds positive, nontender  Extremities: Trace to 1+ edema bilaterally  Neurologic: Awake and alert     Access:        Basic Metabolic Panel:  Recent Labs  Lab 02/16/21 0420 02/17/21 0623 02/18/21 0712 02/19/21 0535 02/21/21 0534 02/21/21 1434 02/22/21 0511  NA 139   < > 138 140 137 136 138  K 4.2   < > 5.1 5.0 5.3* 5.4* 4.5  CL 112*   < > 108 107 105 104 105  CO2 22   < > '24 26 26 26 26  '$ GLUCOSE 187*   < > 348* 154* 215* 178* 206*  BUN 37*   < > 43* 42* 58* 60* 61*  CREATININE 1.19   < > 1.29* 1.29* 1.34* 1.55* 1.26*  CALCIUM 8.3*   < > 8.3* 8.4* 8.6* 8.4* 8.1*  MG 2.0  --   --   --   --   --   --    < > = values in this interval not displayed.     CBC: Recent Labs  Lab 02/16/21 0420 02/18/21 0712  02/19/21 0535 02/20/21 0521  WBC 4.3 3.5* 4.0 3.7*  NEUTROABS 3.0 2.7 2.8 2.3  HGB 9.1* 8.7* 9.0* 8.9*  HCT 26.7* 27.2* 28.0* 28.1*  MCV 93.4 96.8 96.9 96.2  PLT 103* 108* 118* 126*     No results found for: HEPBSAG, HEPBSAB, HEPBIGM    Microbiology:  Recent Results (from the past 240 hour(s))  Resp Panel by RT-PCR (Flu A&B, Covid) Nasopharyngeal Swab     Status: None   Collection Time: 02/13/21  4:03 PM   Specimen: Nasopharyngeal Swab; Nasopharyngeal(NP) swabs in vial transport medium  Result Value Ref Range Status   SARS Coronavirus 2 by RT PCR NEGATIVE NEGATIVE Final    Comment: (NOTE) SARS-CoV-2 target nucleic acids are NOT DETECTED.  The SARS-CoV-2 RNA is generally detectable in upper respiratory specimens during the acute phase of infection. The lowest concentration of SARS-CoV-2 viral copies this assay can detect is 138 copies/mL. A negative result does not preclude SARS-Cov-2 infection and should not be used as the sole basis for treatment or other patient management decisions. A negative result may occur with  improper specimen collection/handling, submission of specimen other than nasopharyngeal swab, presence of viral mutation(s) within the areas targeted by this assay, and inadequate number of viral copies(<138 copies/mL). A negative result must be combined with clinical observations, patient history, and epidemiological information. The expected result is Negative.  Fact Sheet for Patients:  EntrepreneurPulse.com.au  Fact Sheet for Healthcare Providers:  IncredibleEmployment.be  This test is no t yet approved or cleared by the [place] FDA and  has been authorized for detection and/or diagnosis of SARS-CoV-2 by FDA under an Emergency Use Authorization (EUA). This EUA will remain  in effect (meaning this test can be used) for the duration of the COVID-19 declaration under Section 564(b)(1) of the Act,  21 U.S.C.section 360bbb-3(b)(1), unless the authorization is terminated  or revoked sooner.       Influenza A by PCR NEGATIVE NEGATIVE Final   Influenza B by PCR NEGATIVE NEGATIVE Final    Comment: (NOTE) The Xpert Xpress SARS-CoV-2/FLU/RSV plus assay is intended as an aid in the diagnosis of influenza from Nasopharyngeal swab specimens and should not be used as a sole basis for treatment. Nasal washings and aspirates are unacceptable for Xpert Xpress SARS-CoV-2/FLU/RSV testing.  Fact Sheet for Patients: EntrepreneurPulse.com.au  Fact Sheet for Healthcare Providers: IncredibleEmployment.be  This test is not yet approved or cleared by the [place] FDA and has been authorized for detection and/or diagnosis of SARS-CoV-2 by FDA under an Emergency Use Authorization (EUA). This EUA will remain in effect (meaning this test can be used) for the duration of the COVID-19 declaration under Section 564(b)(1) of the Act, 21 U.S.C. section 360bbb-3(b)(1), unless the authorization is terminated or revoked.  Performed at [facility], [address] [place], [place] [address]   Culture, blood (Routine X 2) w Reflex to ID Panel     Status: None   Collection Time: 02/13/21  7:01 PM   Specimen: BLOOD  Result Value Ref Range Status   Specimen Description BLOOD RIGHT ANTECUBITAL  Final   Special Requests   Final    BOTTLES DRAWN AEROBIC AND ANAEROBIC Blood Culture adequate volume   Culture   Final    NO GROWTH 5 DAYS Performed at [facility], [address] [place], [place] [address]    Report Status 02/18/2021 FINAL  Final  Culture, blood (Routine X 2) w Reflex to ID Panel     Status: None   Collection Time: 02/13/21  7:10 PM   Specimen: BLOOD  Result Value Ref Range Status   Specimen Description BLOOD BLOOD LEFT ARM  Final   Special Requests   Final    BOTTLES DRAWN AEROBIC AND ANAEROBIC Blood Culture adequate volume    Culture   Final    NO GROWTH 5 DAYS Performed at [facility], [address], [place], [place] [address]    Report Status 02/18/2021 FINAL  Final  Aerobic/Anaerobic Culture w Gram Stain (surgical/deep wound)     Status: None (Preliminary result)   Collection Time: 02/17/21  2:56 PM   Specimen: Wound  Result Value Ref Range Status   Specimen Description   Final    WOUND Performed at [facility], [address] [place], [place] [address]    Special Requests   Final    NONE Performed at [facility], [address] [place], [place] [address]    Gram Stain   Final    NO WBC SEEN NO ORGANISMS SEEN Performed at [facility], [address] [place], [place] [address]    Culture   Final    RARE NORMAL SKIN FLORA NO ANAEROBES ISOLATED; CULTURE IN PROGRESS FOR 5 DAYS    Report Status PENDING  Incomplete  Aerobic/Anaerobic Culture w Gram Stain (surgical/deep wound)     Status: None (Preliminary result)   Collection Time: 02/18/21 12:51 PM   Specimen: PATH Bone resection; Tissue  Result Value Ref Range Status   Specimen Description   Final    TISSUE Performed at [facility], [address], [place], [place] [address]    Special Requests   Final    RIGHT GREAT TOE BONE CULTURE Performed at [facility], [place], [place] [address]    Gram Stain NO WBC SEEN NO ORGANISMS SEEN   Final   Culture   Final    NO GROWTH 3 DAYS NO ANAEROBES ISOLATED; CULTURE IN PROGRESS FOR 5 DAYS Performed at [facility] [address] [place], [place] [address]    Report Status PENDING  Incomplete    Coagulation Studies: No results for input(s): LABPROT, INR in the last 72 hours.  Urinalysis: No results for input(s): COLORURINE, LABSPEC, PHURINE, GLUCOSEU, HGBUR, BILIRUBINUR, KETONESUR, PROTEINUR, UROBILINOGEN, NITRITE, LEUKOCYTESUR in the last 72 hours.  Invalid input(s): APPERANCEUR    Imaging: No results  found.   Medications:   . sodium chloride 10 mL/hr at 02/18/21 1229  . sodium chloride     . allopurinol  100 mg Oral Daily  . amLODipine  10 mg Oral Daily  . aspirin EC  81 mg Oral Daily  . baclofen  10 mg Oral BID  . calcium carbonate  1 tablet Oral TID WC  . carvedilol  12.5 mg Oral BID  . cephALEXin  500 mg Oral Q8H  . clopidogrel  75 mg Oral Daily  . furosemide  20 mg Oral Daily  . gabapentin  100 mg Oral QHS  . insulin aspart  0-5 Units Subcutaneous QHS  . insulin aspart  0-9 Units Subcutaneous TID WC  . insulin aspart  3 Units Subcutaneous TID WC  . insulin detemir  10 Units Subcutaneous BID  . isosorbide mononitrate  30 mg Oral Daily  . loratadine  10 mg Oral Daily  . multivitamin with minerals  1 tablet Oral Daily  . pantoprazole  40 mg Oral Daily  . polyethylene glycol  17 g Oral Daily  . Ensure Max Protein  11 oz Oral Daily  . rosuvastatin  40 mg Oral QHS  . senna-docusate  1 tablet Oral BID  . sertraline  200 mg Oral Daily  . tamsulosin  0.4 mg Oral Daily     Assessment/ Plan:  80 y.o. male  is admitted due to right foot pain. He has a past medical history of diabetes, peripheral vascular disease, foot ulcers, hypertension, TAVR, pacemaker placed, with chronic kidney disease and anemia.  He is now s/p toe amputation.  Patient had hyperkalemia and received Kayexalate yesterday.  1. CKD -renal indices are close to baseline. Anemia of CKD. Hemoglobin & Hematocrit     Component Value Date/Time   HGB 8.9 (L) 02/20/2021 0521   HGB 11.7 (L) 11/19/2020 1439   HCT 28.1 (L) 02/20/2021 0521   HCT 34.3 (L) 11/19/2020 1439   2.  SHPTH Last PTH 3. No results found for: PTH 4. HTN with CKD   Patient can be discharged home today.  I will follow up in the office.   LOS:  Omega 3/12/202210:23 [place], [place]  Note: This note was prepared with Dragon dictation. Any transcription errors are  unintentional

## 2021-02-22 NOTE — Progress Notes (Signed)
Pt and his fiancee are tired of waiting on the [facility] to be delivered to his room by [employer]; staff will place a patient sticker on the [facility] when it is delivered in order for them to pick it up on 02/23/21; pt discharged via wheelchair by auxillary to the [facility] entrance

## 2021-02-22 NOTE — Progress Notes (Signed)
MD order received in [facility] to discharge pt home today; TOC previously established DME needs with [employer] for Rolling Walker and 3 in 1 bedside commode; pt already has rolling walker in the room; verbally reviewed AVS with pt, gave Rxs to pt including Rx for Norco signed by Dr Olevia Bowens; pt verbalized understanding with no questions voiced; discharge pending arrival of 3in1 BSC from [employer]

## 2021-02-24 LAB — AEROBIC/ANAEROBIC CULTURE W GRAM STAIN (SURGICAL/DEEP WOUND)
Culture: NO GROWTH
Gram Stain: NONE SEEN

## 2021-03-03 ENCOUNTER — Other Ambulatory Visit: Payer: Self-pay

## 2021-03-03 ENCOUNTER — Ambulatory Visit
Admission: RE | Admit: 2021-03-03 | Discharge: 2021-03-03 | Disposition: A | Discharge status: Home / Self Care | Payer: Medicare Other | Source: Ambulatory Visit | Attending: Pain Medicine | Admitting: Pain Medicine

## 2021-03-03 ENCOUNTER — Ambulatory Visit (HOSPITAL_BASED_OUTPATIENT_CLINIC_OR_DEPARTMENT_OTHER): Payer: Medicare Other | Admitting: Pain Medicine

## 2021-03-03 ENCOUNTER — Encounter: Payer: Self-pay | Admitting: Pain Medicine

## 2021-03-03 VITALS — BP 133/63 | HR 117 | Temp 97.3°F | Resp 18 | Ht 72.0 in | Wt 240.0 lb

## 2021-03-03 DIAGNOSIS — M545 Low back pain, unspecified: Secondary | ICD-10-CM | POA: Insufficient documentation

## 2021-03-03 DIAGNOSIS — G8929 Other chronic pain: Secondary | ICD-10-CM

## 2021-03-03 DIAGNOSIS — M47812 Spondylosis without myelopathy or radiculopathy, cervical region: Secondary | ICD-10-CM

## 2021-03-03 DIAGNOSIS — Z96653 Presence of artificial knee joint, bilateral: Secondary | ICD-10-CM

## 2021-03-03 DIAGNOSIS — F1120 Opioid dependence, uncomplicated: Secondary | ICD-10-CM

## 2021-03-03 DIAGNOSIS — G894 Chronic pain syndrome: Secondary | ICD-10-CM

## 2021-03-03 DIAGNOSIS — M899 Disorder of bone, unspecified: Secondary | ICD-10-CM

## 2021-03-03 DIAGNOSIS — Z789 Other specified health status: Secondary | ICD-10-CM

## 2021-03-03 DIAGNOSIS — M5134 Other intervertebral disc degeneration, thoracic region: Secondary | ICD-10-CM

## 2021-03-03 DIAGNOSIS — M5030 Other cervical disc degeneration, unspecified cervical region: Secondary | ICD-10-CM | POA: Insufficient documentation

## 2021-03-03 DIAGNOSIS — Z79899 Other long term (current) drug therapy: Secondary | ICD-10-CM | POA: Insufficient documentation

## 2021-03-03 DIAGNOSIS — M79605 Pain in left leg: Secondary | ICD-10-CM

## 2021-03-03 DIAGNOSIS — M25512 Pain in left shoulder: Secondary | ICD-10-CM | POA: Insufficient documentation

## 2021-03-03 DIAGNOSIS — Z7901 Long term (current) use of anticoagulants: Secondary | ICD-10-CM | POA: Insufficient documentation

## 2021-03-03 DIAGNOSIS — M79604 Pain in right leg: Secondary | ICD-10-CM

## 2021-03-03 NOTE — Progress Notes (Signed)
Safety precautions to be maintained throughout the outpatient stay will include: orient to surroundings, keep bed in low position, maintain call bell within reach at all times, provide assistance with transfer out of bed and ambulation.  

## 2021-03-03 NOTE — Progress Notes (Signed)
Patient: Nathan Singleton  Service Category: E/M  Provider: Gaspar Cola, MD  DOB: 27-Feb-1941  DOS: 03/03/2021  Referring Provider: Caroline More, DPM  MRN: [id number]  Setting: Ambulatory outpatient  PCP: Ranae Plumber, PA  Type: New Patient  Specialty: Interventional Pain Management    Location: Office  Delivery: Face-to-face     Primary Reason(s) for Visit: Encounter for initial evaluation of one or more chronic problems (new to examiner) potentially causing chronic pain, and posing a threat to normal musculoskeletal function. (Level of risk: High) CC: Back Pain (low)  HPI  Mr. Buonocore is a 80 y.o. year old, male patient, who comes for the first time to our practice referred by Caroline More, DPM for our initial evaluation of his chronic pain. 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; Pacemaker St Jude device; Peripheral vascular disease, unspecified ([place]) stents to popliteal arteries many years ago; History of gingival bleeding; Coronary artery disease involving coronary bypass graft of native heart with angina pectoris ([place]); 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; Chest pain; S/P TAVR (transcatheter aortic valve replacement); PVD (peripheral vascular disease) ([place]); Presence of permanent cardiac pacemaker; Hypertension associated with diabetes ([place]); Hyperlipidemia associated with type 2 diabetes mellitus ([place]); History of GI bleed; CAD (coronary artery disease); Thrombocytopenia ([place]); Anemia; Blisters of multiple sites; Calculus of gallbladder with acute cholecystitis without obstruction; Cellulitis; Chronic kidney disease (CKD); Chronic low back pain (1ry area of Pain) (Bilateral) (L>R) w/o sciatica; Claudication ([place]); Closed nondisplaced fracture of greater trochanter of right femur ([place]); Contusion of right thigh; COPD (chronic obstructive pulmonary  disease) ([place]); DDD (degenerative disc disease), lumbar; Deafness, left; Bipolar disorder ([place]); Depression; Dyspnea; Erectile dysfunction; GERD without esophagitis; Gout; Hearing loss; Heart block; Hyperlipidemia, acquired; Hyperlipidemia; Insomnia; Left asymmetrical SNHL; Myocardial infarction ([place]); Nonrheumatic aortic valve stenosis; OA (osteoarthritis) of knee; Obesity; Personal history of other diseases of urinary system; Retinopathy; Right leg swelling; Sleep apnea; Status post percutaneous transluminal coronary angioplasty; Tietze's syndrome; Ulcer of jejunum; Urinary retention; Varicose veins; Venous insufficiency; Vitamin D deficiency; Diabetic foot ulcer (HCC); BPH (benign prostatic hyperplasia); Chronic pain syndrome; Pharmacologic therapy; Disorder of skeletal system; Problems influencing health status; 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[place]); Chronic lower extremity pain (2ry area of Pain) (Bilateral) (R>L); Chronic anticoagulation (Plavix); Chronic shoulder pain (3ry area of Pain) (Left); History of total knee replacement (Bilateral); DDD (degenerative disc disease), thoracic; Cervical facet hypertrophy (Multilevel) (Bilateral); and DDD (degenerative disc disease), cervical on their problem list. Today he comes in for evaluation of his Back Pain (low)  Pain Assessment: Location: Lower Back Radiating: bilatearl legs Onset: More than a month ago Duration: Chronic pain Quality: Aching,Sharp Severity: 7 /10 (subjective, self-reported pain score)  Effect on ADL:   Timing: Constant Modifying factors: medications BP: 133/63  HR: (!) 117  Onset and Duration: Present longer than 3 months Cause of pain: Unknown Severity: Getting worse, NAS-11 at its worse: 10/10, NAS-11 at its best: 5/10 and NAS-11 now: 7/10 Timing: After a period of immobility Aggravating Factors: Bending, Kneeling, Lifiting, Prolonged sitting, Prolonged standing, Squatting, Stooping , Walking, Walking uphill and Walking  downhill Alleviating Factors: Medications Associated Problems: Depression, Dizziness, Fatigue, Sadness, Weakness, Pain that wakes patient up and Pain that does not allow patient to sleep Quality of Pain: Constant, Horrible and Throbbing Previous Examinations or Tests: Nerve block and X-rays Previous Treatments: Narcotic medications  According to the patient he recently [place] [facility]  He was being seen at the [facility] in [place], [place].  According to the patient the primary area of pain inside of the lower back (Bilateral) (L>R).  He denies any back surgeries but does admit to having had nerve blocks and all other interventional therapies which apparently were the on [facility] the [facility].  Initial scanning of the Internet for the location showed practice this primarily associated with orthopedic surgeons.  The patient secondary area of pain is that of the lower extremities (Bilateral) (R>L).  He indicates that the lower extremity pain was improved with revascularization of the lower extremity.  He is currently on Plavix + ASA 81 mg.  He also has a history of a bilateral total knee replacement.  Other medical problems include having a pacemaker, having had surgery for a replacement of a heart valve, having a cochlear implant and still being very hard of hearing.  More recently he had a fall this past winter where he landed on his left shoulder and since he has not been able to achieve full range of motion and continues to have chronic pain in the left shoulder.  X-rays were negative for any acute fractures.  Today I took the time to provide the patient with information regarding my pain practice. The patient was informed that my practice is divided into two sections: an interventional pain management section, as well as a completely separate and distinct medication management section. I explained that I have procedure days for my interventional  therapies, and evaluation days for follow-ups and medication management. Because of the amount of documentation required during both, they are kept separated. This means that there is the possibility that he may be scheduled for a procedure on one day, and medication management the next. I have also informed him that because of staffing and facility limitations, I no longer take patients for medication management only. To illustrate the reasons for this, I gave the patient the example of surgeons, and how inappropriate it would be to refer a patient to his/her care, just to write for the post-surgical antibiotics on a surgery done by a different surgeon.   Because interventional pain management is my board-certified specialty, the patient was informed that joining my practice means that they are open to any and all interventional therapies. I made it clear that this does not mean that they will be forced to have any procedures done. What this means is that I believe interventional therapies to be essential part of the diagnosis and proper management of chronic pain conditions. Therefore, patients not interested in these interventional alternatives will be better served under the care of a different practitioner.  The patient was also made aware of my Comprehensive Pain Management Safety Guidelines where by joining my practice, they limit all of their nerve blocks and joint injections to those done by our practice, for as long as we are retained to manage their care.   Historic Controlled Substance Pharmacotherapy Review  PMP and historical list of controlled substances: Hydrocodone/APAP 10/325 1 tab p.o. 3 times daily (30 mg/day of hydrocodone) (30 MME) Current opioid analgesics: Hydrocodone/APAP 10/325 1 tab p.o. 3 times daily (30 mg/day of hydrocodone) (30 MME) MME/day: 30 mg/day  Historical Monitoring: The patient  reports previous drug use. List of all UDS Test(s): No results found for: MDMA,  COCAINSCRNUR, PCPSCRNUR, [place], CANNABQUANT, [place], [place] List of other Serum/Urine Drug Screening Test(s):  No results found  for: AMPHSCRSER, BARBSCRSER, BENZOSCRSER, COCAINSCRSER, COCAINSCRNUR, PCPSCRSER, PCPQUANT, THCSCRSER, THCU, CANNABQUANT, OPIATESCRSER, OXYSCRSER, PROPOXSCRSER, ETH Historical Background Evaluation: [place] PMP: PDMP reviewed during this encounter. Online review of the past 60-monthperiod conducted.             PMP NARX Score Report:  Narcotic: 240 Sedative: 130 Stimulant: 000 [place] Department of public safety, offender search: (Editor, commissioningInformation) Non-contributory Risk Assessment Profile: Aberrant behavior: None observed or detected today Risk factors for fatal opioid overdose: None identified today PMP NARX Overdose Risk Score: 360 Fatal overdose hazard ratio (HR): Calculation deferred Non-fatal overdose hazard ratio (HR): Calculation deferred Risk of opioid abuse or dependence: 0.7-3.0% with doses ? 36 MME/day and 6.1-26% with doses ? 120 MME/day. Substance use disorder (SUD) risk level: See below Personal History of Substance Abuse (SUD-Substance use disorder):  Alcohol: Positive Male or Male (435years ago)  Illegal Drugs: Positive Male or Male (46 years ago)  Rx Drugs: Negative  ORT Risk Level calculation: High Risk  Opioid Risk Tool - 03/03/21 1315      Personal History of Substance Abuse   Alcohol Positive Male or Male   416years ago   Illegal Drugs Positive Male or Male   433years ago   Rx Drugs Negative      Age   Age between 164-45years  No      History of Preadolescent Sexual Abuse   History of Preadolescent Sexual Abuse Negative or Male      Psychological Disease   Psychological Disease Positive    Bipolar Positive    Depression Positive      Total Score   Opioid Risk Tool Scoring 10    Opioid Risk Interpretation High Risk          ORT Scoring interpretation table:  Score <3 = Low Risk for SUD  Score between 4-7 = Moderate Risk for  SUD  Score >8 = High Risk for Opioid Abuse   PHQ-2 Depression Scale:  Total score: 0  PHQ-2 Scoring interpretation table: (Score and probability of major depressive disorder)  Score 0 = No depression  Score 1 = 15.4% Probability  Score 2 = 21.1% Probability  Score 3 = 38.4% Probability  Score 4 = 45.5% Probability  Score 5 = 56.4% Probability  Score 6 = 78.6% Probability   PHQ-9 Depression Scale:  Total score: 0  PHQ-9 Scoring interpretation table:  Score 0-4 = No depression  Score 5-9 = Mild depression  Score 10-14 = Moderate depression  Score 15-19 = Moderately severe depression  Score 20-27 = Severe depression (2.4 times higher risk of SUD and 2.89 times higher risk of overuse)   Pharmacologic Plan: As per protocol, I have not taken over any controlled substance management, pending the results of ordered tests and/or consults.            Initial impression: Pending review of available data and ordered tests.  Meds   Current Outpatient Medications:  .  baclofen (LIORESAL) 10 MG tablet, Take 10 mg by mouth 2 (two) times daily., Disp: , Rfl:  .  BAYER ASPIRIN EC LOW DOSE 81 MG EC tablet, Take 81 mg by mouth daily., Disp: , Rfl:  .  calcium citrate-vitamin D (CITRACAL+D) 315-200 MG-UNIT tablet, Take 1 tablet by mouth daily., Disp: , Rfl:  .  cephALEXin (KEFLEX) 500 MG capsule, Take 1 capsule (500 mg total) by mouth every 8 (eight) hours for 10 days., Disp: 30 capsule, Rfl: 0 .  cetirizine (ZYRTEC)  10 MG tablet, Take 10 mg by mouth daily., Disp: , Rfl:  .  clopidogrel (PLAVIX) 75 MG tablet, Take 1 tablet (75 mg total) by mouth daily., Disp: 60 tablet, Rfl: 3 .  D-5000 125 MCG (5000 UT) TABS, Take 1 tablet by mouth daily., Disp: , Rfl:  .  diphenoxylate-atropine (LOMOTIL) 2.5-0.025 MG tablet, Take 1 tablet by mouth every 6 (six) hours as needed for diarrhea or loose stools., Disp: , Rfl:  .  docusate sodium (COLACE) 100 MG capsule, Take 200 mg by mouth 2 (two) times daily., Disp: ,  Rfl:  .  furosemide (LASIX) 20 MG tablet, Take 20 mg by mouth daily., Disp: , Rfl:  .  gabapentin (NEURONTIN) 100 MG capsule, Take 100 mg by mouth daily., Disp: , Rfl:  .  HYDROcodone-acetaminophen (NORCO) 7.5-325 MG tablet, Take 1-2 tablets by mouth every 6 (six) hours as needed for moderate pain or severe pain., Disp: 20 tablet, Rfl: 0 .  insulin glargine (LANTUS) 100 UNIT/ML Solostar Pen, Inject 25 Units into the skin at bedtime., Disp: , Rfl:  .  isosorbide mononitrate (IMDUR) 30 MG 24 hr tablet, Take 30 mg by mouth daily., Disp: , Rfl:  .  LINZESS 145 MCG CAPS capsule, Take 145 mcg by mouth daily., Disp: , Rfl:  .  liraglutide (VICTOZA) 18 MG/3ML SOPN, Inject 1.8 mg into the skin daily., Disp: , Rfl:  .  losartan (COZAAR) 25 MG tablet, Take 1 tablet (25 mg total) by mouth daily., Disp: , Rfl:  .  multivitamin (ONE-A-DAY MEN'S) TABS tablet, Take 1 tablet by mouth daily., Disp: , Rfl:  .  pantoprazole (PROTONIX) 40 MG tablet, Take 1 tablet (40 mg total) by mouth daily., Disp: 90 tablet, Rfl: 1 .  rosuvastatin (CRESTOR) 40 MG tablet, Take 40 mg by mouth daily., Disp: , Rfl:  .  sertraline (ZOLOFT) 100 MG tablet, Take 200 mg by mouth daily., Disp: , Rfl:  .  tamsulosin (FLOMAX) 0.4 MG CAPS capsule, Take 0.4 mg by mouth daily., Disp: , Rfl:  .  allopurinol (ZYLOPRIM) 100 MG tablet, Take 100 mg by mouth daily., Disp: , Rfl:  .  amLODipine (NORVASC) 10 MG tablet, Take 10 mg by mouth daily., Disp: , Rfl:   Imaging Review  Cervical Imaging: Cervical CT wo contrast: Results for orders placed during the hospital encounter of 12/31/20 CT Cervical Spine Wo Contrast  Narrative CLINICAL DATA:  Status post fall.  EXAM: CT CERVICAL SPINE WITHOUT CONTRAST  TECHNIQUE: Multidetector CT imaging of the cervical spine was performed without intravenous contrast. Multiplanar CT image reconstructions were also generated.  COMPARISON:  None.  FINDINGS: Alignment: Normal.  Skull base and vertebrae: No  acute fracture. Chronic changes are seen involving the tip of the dens. No primary bone lesion or focal pathologic process.  Soft tissues and spinal canal: No prevertebral fluid or swelling. No visible canal hematoma.  Disc levels: Moderate to marked severity endplate sclerosis and anterior osteophyte formation is seen at the levels of C2-C3, C3-C4, C4-C5, C5-C6, C6-C7 and C7-T1.  Marked severity intervertebral disc space narrowing is seen at the level of C4-C5. Moderate severity intervertebral disc space narrowing is seen throughout the remainder of the cervical spine.  Moderate severity bilateral multilevel facet joint hypertrophy is noted.  Upper chest: Negative.  Other: None.  IMPRESSION: 1. Moderate to marked severity degenerative changes of the cervical spine, as described above. 2. No evidence of an acute fracture or subluxation.   Electronically Signed By: Virgina Norfolk  M.D. On: 12/31/2020 18:25  Shoulder Imaging: Shoulder-L DG: Results for orders placed during the hospital encounter of 12/31/20 DG Shoulder Left  Narrative CLINICAL DATA:  Left shoulder pain after fall.  EXAM: LEFT SHOULDER - 2+ VIEW  COMPARISON:  September 06, 2020.  FINDINGS: There is no evidence of fracture or dislocation. Mild degenerative changes seen involving the left acromioclavicular joint. Calcification is seen over the humeral head suggesting calcific tendinosis. Soft tissues are unremarkable.  IMPRESSION: No acute abnormality seen in the left shoulder. Probable calcific tendinosis of rotator cuff.   Electronically Signed By: Marijo Conception M.D. On: 12/31/2020 14:56  Thoracic Imaging: Thoracic DG 2-3 views: Results for orders placed during the hospital encounter of 10/07/20 DG Thoracic Spine 2 View  Narrative CLINICAL DATA:  Status post fall.  EXAM: THORACIC SPINE 2 VIEWS  COMPARISON:  None.  FINDINGS: There is no evidence of an acute thoracic spine  fracture. Alignment is normal. There is moderate severity multilevel endplate sclerosis with mild to moderate severity multilevel intervertebral disc space narrowing. No other significant bone abnormalities are identified. An artificial aortic valve is seen with radiopaque surgical clips noted within the left upper quadrant.  IMPRESSION: 1. Moderate severity multilevel degenerative disc disease.   Electronically Signed By: Virgina Norfolk M.D. On: 10/07/2020 19:02  Lumbosacral Imaging: Lumbar DG 2-3 views: Results for orders placed during the hospital encounter of 10/07/20 DG Lumbar Spine 2-3 Views  Narrative CLINICAL DATA:  Status post recent fall.  EXAM: LUMBAR SPINE - 2-3 VIEW  COMPARISON:  September 06, 2020  FINDINGS: There is no evidence of an acute lumbar spine fracture. Alignment is normal. There is moderate to marked severity endplate sclerosis and anterior osteophyte formation. Mild multilevel intervertebral disc space narrowing is seen. Radiopaque surgical clips are seen within the bilateral upper quadrants.  IMPRESSION: 1. Moderate to marked severity multilevel degenerative disc disease.   Electronically Signed By: Virgina Norfolk M.D. On: 10/07/2020 19:04  Foot Imaging: Foot-R DG Complete: Results for orders placed during the hospital encounter of 02/13/21 DG Foot Complete Right  Narrative CLINICAL DATA:  Right great toe ulcer.  EXAM: RIGHT FOOT COMPLETE - 3+ VIEW  COMPARISON:  No prior.  FINDINGS: Soft tissue swelling. Ulceration of the right great toe noted. No radiopaque foreign body. No adjacent bony erosion. If osteomyelitis remains a clinical concern MRI can be obtained. Diffuse degenerative change. No fracture or dislocation. Peripheral vascular calcification.  IMPRESSION: 1. Ulceration noted about the right great toe. No radiopaque foreign body. No adjacent bony erosion. If osteomyelitis remains a clinical concern MRI can be  obtained.  2.  Diffuse degenerative change.  No acute bony abnormality.  3.  Peripheral vascular disease.  Electronically Signed By: Marcello Moores  Register On: 02/13/2021 14:59  Complexity Note: Imaging results reviewed. Results shared with Mr. Keizer, using Layman's terms.                        ROS  Cardiovascular: Heart trouble, Daily Aspirin intake, High blood pressure, Chest pain, Heart attack ( Date: 2018), Pacemaker or defibrillator, Heart valve problems, Heart catheterization, Blood thinners:  Anticoagulant and Needs antibiotics prior to dental procedures Pulmonary or Respiratory: Temporary stoppage of breathing during sleep Neurological: Abnormal skin sensations (Peripheral Neuropathy) Psychological-Psychiatric: Anxiousness and Depressed Gastrointestinal: Reflux or heatburn and Irregular, infrequent bowel movements (Constipation) Genitourinary: Kidney disease Hematological: Brusing easily and Bleeding easily Endocrine: High blood sugar controlled without the use of insulin (NIDDM) Rheumatologic: No reported  rheumatological signs and symptoms such as fatigue, joint pain, tenderness, swelling, redness, heat, stiffness, decreased range of motion, with or without associated rash Musculoskeletal: Negative for myasthenia gravis, muscular dystrophy, multiple sclerosis or malignant hyperthermia  Allergies  Mr. Bhardwaj is allergic to atorvastatin, celecoxib, dicyclomine hcl, doxycycline, and metformin.  Laboratory Chemistry Profile   Renal Lab Results  Component Value Date   BUN 61 (H) 02/22/2021   CREATININE 1.26 (H) 02/22/2021   BCR 36 (H) 11/19/2020   GFRAA 76 11/19/2020   GFRNONAA 58 (L) 02/22/2021   PROTEINUR NEGATIVE 02/13/2021     Electrolytes Lab Results  Component Value Date   NA 138 02/22/2021   K 4.5 02/22/2021   CL 105 02/22/2021   CALCIUM 8.1 (L) 02/22/2021   MG 2.0 02/16/2021     Hepatic Lab Results  Component Value Date   AST 27 02/14/2021   ALT 28  02/14/2021   ALBUMIN 3.0 (L) 02/14/2021   ALKPHOS 83 02/14/2021   LIPASE 29 12/31/2020     ID Lab Results  Component Value Date   SARSCOV2NAA NEGATIVE 02/13/2021     Bone No results found for: VD25OH, [id number], JW1191YN8, GN5621HY8, 25OHVITD1, 25OHVITD2, 25OHVITD3, TESTOFREE, TESTOSTERONE   Endocrine Lab Results  Component Value Date   GLUCOSE 206 (H) 02/22/2021   GLUCOSEU NEGATIVE 02/13/2021   HGBA1C 7.8 (H) 02/19/2021   TSH 3.740 01/13/2021   FREET4 0.74 01/13/2021     Neuropathy Lab Results  Component Value Date   HGBA1C 7.8 (H) 02/19/2021     CNS No results found for: COLORCSF, APPEARCSF, RBCCOUNTCSF, WBCCSF, POLYSCSF, LYMPHSCSF, EOSCSF, PROTEINCSF, GLUCCSF, JCVIRUS, CSFOLI, IGGCSF, LABACHR, ACETBL, LABACHR, ACETBL   Inflammation (CRP: Acute  ESR: Chronic) Lab Results  Component Value Date   LATICACIDVEN 1.1 02/14/2021     Rheumatology No results found for: RF, ANA, LABURIC, URICUR, LYMEIGGIGMAB, LYMEABIGMQN, HLAB27   Coagulation Lab Results  Component Value Date   INR 1.3 (H) 02/14/2021   LABPROT 15.7 (H) 02/14/2021   PLT 126 (L) 02/20/2021     Cardiovascular Lab Results  Component Value Date   HGB 8.9 (L) 02/20/2021   HCT 28.1 (L) 02/20/2021     Screening Lab Results  Component Value Date   SARSCOV2NAA NEGATIVE 02/13/2021     Cancer No results found for: CEA, CA125, LABCA2   Allergens No results found for: ALMOND, APPLE, ASPARAGUS, AVOCADO, BANANA, BARLEY, BASIL, BAYLEAF, GREENBEAN, LIMABEAN, WHITEBEAN, BEEFIGE, REDBEET, BLUEBERRY, BROCCOLI, CABBAGE, MELON, CARROT, CASEIN, CASHEWNUT, CAULIFLOWER, CELERY     Note: Lab results reviewed.  PFSH  Drug: Mr. Elvin  reports previous drug use. Alcohol:  reports previous alcohol use. Tobacco:  reports that he has quit smoking. He has never used smokeless tobacco. Medical:  has a past medical history of Aneurysm (arteriovenous) of coronary vessels, Arthritis, Ascending aortic aneurysm ([place]), CAD  (coronary artery disease), Diabetes mellitus without complication ([place]), History of GI bleed, Hyperlipidemia associated with type 2 diabetes mellitus ([place]), Hypertension associated with diabetes ([place]), Presence of permanent cardiac pacemaker, PVD (peripheral vascular disease) ([place]), and S/P TAVR (transcatheter aortic valve replacement). Family: family history includes Alzheimer's disease in his mother; Diabetes in his father; Heart disease in his father and mother.  Past Surgical History:  Procedure Laterality Date  . Ankle repaired Right   . Carpal Tunnel repaired Left   . CATARACT EXTRACTION Bilateral   . COLONOSCOPY    . Copillar implant     . DG CHOLECYSTOGRAPHY GALL BLADDER ([facility])    . GASTRIC BYPASS    .  heart valve replaced    . IRRIGATION AND DEBRIDEMENT FOOT Right 02/18/2021   Procedure: IRRIGATION AND DEBRIDEMENT FOOT-Right Great Toe;  Surgeon: [name], DPM;  Location: [facility] ORS;  Service: Podiatry;  Laterality: Right;  . LOWER EXTREMITY ANGIOGRAPHY Right 02/17/2021   Procedure: Lower Extremity Angiography;  Surgeon: Algernon Huxley, MD;  Location: [facility];  Service: Cardiovascular;  Laterality: Right;  . PACEMAKER GENERATOR CHANGE    . REPLACEMENT TOTAL KNEE BILATERAL    . RTC    . STENT PLACE LEFT URETER ([facility] HX) Right    Right Leg  . Toe nail removed Bilateral    Active Ambulatory Problems    Diagnosis Date Noted  . Hypertension   . DM2 (diabetes mellitus, type 2) ([place])   . Arthritis   . 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 Pacemaker St Jude device 11/19/2020  . Peripheral vascular disease, unspecified ([place]) stents to popliteal arteries many years ago 11/19/2020  . History of gingival bleeding 11/19/2020  . Coronary artery disease involving coronary bypass graft of native heart with angina pectoris ([place]) 11/19/2020  . 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 11/19/2020   . Chest pain 11/21/2020  . S/P TAVR (transcatheter aortic valve replacement)   . PVD (peripheral vascular disease) ([place])   . Presence of permanent cardiac pacemaker   . Hypertension associated with diabetes ([place])   . Hyperlipidemia associated with type 2 diabetes mellitus ([place])   . History of GI bleed   . CAD (coronary artery disease)   . Thrombocytopenia ([place]) 11/21/2020  . Anemia 11/26/2020  . Blisters of multiple sites 01/18/2014  . Calculus of gallbladder with acute cholecystitis without obstruction 09/21/2017  . Cellulitis 01/18/2014  . Chronic kidney disease (CKD) 11/26/2020  . Chronic low back pain (1ry area of Pain) (Bilateral) (L>R) w/o sciatica 12/09/2019  . Claudication ([place]) 11/26/2020  . Closed nondisplaced fracture of greater trochanter of right femur ([place]) 01/25/2020  . Contusion of right thigh 11/26/2020  . COPD (chronic obstructive pulmonary disease) ([place]) 11/26/2020  . DDD (degenerative disc disease), lumbar 12/10/2019  . 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 Bipolar disorder ([place]) 01/01/2019  . Depression 11/26/2020  . Dyspnea 11/26/2020  . Erectile dysfunction 11/26/2020  . GERD without esophagitis 11/26/2020  . Gout 11/26/2020  . Hearing loss 05/11/2019  . Heart block 04/25/2018  . Hyperlipidemia, acquired 11/26/2020  . Hyperlipidemia 11/26/2020  . Insomnia 03/22/2014  . 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 Myocardial infarction ([place]) 11/26/2020  . 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 OA (osteoarthritis) of knee 06/09/2013  . Obesity 04/07/2019  . Personal history of other diseases of urinary system 11/26/2020  . 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 Right leg swelling 11/26/2020  . Sleep apnea 11/26/2020  . Status post percutaneous transluminal coronary angioplasty 01/14/2011  . 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 Ulcer of jejunum 03/23/2014  . Urinary retention 12/09/2019  . Varicose veins 11/26/2020  . Venous insufficiency 11/26/2020  . Vitamin D deficiency  11/26/2020  . Diabetic foot ulcer ([place]) 02/13/2021  . BPH (benign prostatic hyperplasia) 02/14/2021  . Chronic pain syndrome 03/03/2021  . Pharmacologic therapy 03/03/2021  . Disorder of skeletal system 03/03/2021  . Problems influencing health status 03/03/2021  . 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[place]) 03/03/2021  . Chronic lower extremity pain (2ry area of Pain) (Bilateral) (R>L) 03/03/2021  . Chronic anticoagulation (Plavix) 03/03/2021  . Chronic shoulder pain (3ry area of Pain) (Left)  03/03/2021  . 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 Cervical facet hypertrophy (Multilevel) (Bilateral) 03/03/2021  . DDD (degenerative disc disease), cervical 03/03/2021   Resolved Ambulatory Problems    Diagnosis Date Noted  . No Resolved Ambulatory Problems   Past Medical History:  Diagnosis Date  . Aneurysm (arteriovenous) of coronary vessels   . Diabetes mellitus without complication ([place])    Constitutional Exam  General appearance: Well nourished, well developed, and well hydrated. In no apparent acute distress Vitals:   03/03/21 1255  BP: 133/63  Pulse: (!) 117  Resp: 18  Temp: (!) 97.3 F (36.3 C)  TempSrc: Temporal  SpO2: 100%  Weight: 240 lb (108.9 kg)  Height: 6' (1.829 m)   BMI Assessment: Estimated body mass index is 32.55 kg/m as calculated from the following:   Height as of this encounter: 6' (1.829 m).   Weight as of this encounter: 240 lb (108.9 kg).  BMI interpretation table: BMI level Category Range association with higher incidence of chronic pain  <18 kg/m2 Underweight   18.5-24.9 kg/m2 Ideal body weight   25-29.9 kg/m2 Overweight Increased incidence by 20%  30-34.9 kg/m2 Obese (Class I) Increased incidence by 68%  35-39.9 kg/m2 Severe obesity (Class II) Increased incidence by 136%  >40 kg/m2 Extreme obesity (Class III) Increased incidence by 254%   Patient's current BMI Ideal Body  weight  Body mass index is 32.55 kg/m. Ideal body weight: 77.6 kg (171 lb 1.2 oz) Adjusted ideal body weight: 90.1 kg (198 lb 10.3 oz)   BMI Readings from Last 4 Encounters:  03/03/21 32.55 kg/m  02/22/21 34.00 kg/m  02/11/21 34.31 kg/m  12/31/20 32.55 kg/m   Wt Readings from Last 4 Encounters:  03/03/21 240 lb (108.9 kg)  02/22/21 250 lb 10.6 oz (113.7 kg)  02/11/21 253 lb (114.8 kg)  12/31/20 240 lb (108.9 kg)   Psych/Mental status: Alert, oriented x 3 (person, place, & time)       Eyes: PERLA Respiratory: No evidence of acute respiratory distress  Assessment  Primary Diagnosis & Pertinent Problem List: The primary encounter diagnosis was Chronic pain syndrome. Diagnoses of Chronic low back pain (1ry area of Pain) (Bilateral) (L>R) w/o sciatica, Chronic lower extremity pain (2ry area of Pain) (Bilateral) (R>L), Chronic shoulder pain (3ry area of Pain) (Left), History of total knee replacement (Bilateral), DDD (degenerative disc disease), thoracic, Cervical facet hypertrophy (Multilevel) (Bilateral), DDD (degenerative disc disease), cervical, Pharmacologic therapy, Disorder of skeletal system, Problems influencing health status, 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[place]), and Chronic anticoagulation (Plavix) were also pertinent to this visit.  Visit Diagnosis (New problems to examiner): 1. Chronic pain syndrome   2. Chronic low back pain (1ry area of Pain) (Bilateral) (L>R) w/o sciatica   3. Chronic lower extremity pain (2ry area of Pain) (Bilateral) (R>L)   4. Chronic shoulder pain (3ry area of Pain) (Left)   5. History of total knee replacement (Bilateral)   6. DDD (degenerative disc disease), thoracic   7. Cervical facet hypertrophy (Multilevel) (Bilateral)   8. DDD (degenerative disc disease), cervical   9. Pharmacologic therapy   10. Disorder of skeletal system   11. Problems influencing health status   12. 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[place])   13. Chronic anticoagulation  (Plavix)    Plan of Care (Initial workup plan)  Note: Mr. Mutchler was reminded that as per protocol, today's visit has been an evaluation only. We have not taken over the patient's  controlled substance management.  Problem-specific plan: No problem-specific Assessment & Plan notes found for this encounter.   Lab Orders     Compliance Drug Analysis, Ur     Comp. Metabolic Panel (12)     Magnesium     Vitamin B12     Sedimentation rate     25-Hydroxy vitamin D Lcms D2+D3     C-reactive protein     Protime-INR     APTT     Platelet count     Platelet function assay  Imaging Orders     DG Lumbar Spine Complete W/Bend     CT SHOULDER LEFT WO CONTRAST Referral Orders  No referral(s) requested today   Procedure Orders    No procedure(s) ordered today   Pharmacotherapy (current): Medications ordered:  No orders of the defined types were placed in this encounter.  Medications administered during this visit: Domenico Achord had no medications administered during this visit.   Pharmacological management options:  Opioid Analgesics: The patient was informed that there is no guarantee that he would be a candidate for opioid analgesics. The decision will be made following CDC guidelines. This decision will be based on the results of diagnostic studies, as well as Mr. Rothman's risk profile.   Membrane stabilizer: To be determined at a later time  Muscle relaxant: To be determined at a later time  NSAID: To be determined at a later time  Other analgesic(s): To be determined at a later time   Interventional management options: Mr. Tapley was informed that there is no guarantee that he would be a candidate for interventional therapies. The decision will be based on the results of diagnostic studies, as well as Mr. Hollenback's risk profile.  Procedure(s) under consideration:  NOTE: Plavix Anticoagulation (Stop: 7-10 days  Restart: 2 hours)  Possible  diagnostic bilateral lumbar facet  block (Depending on imaging results)    Provider-requested follow-up: Return for (40 min)(2V), (F2F), (s/p Tests).  Future Appointments  Date Time Provider [place]  03/17/2021  5:00 PM [facility]  03/19/2021  2:00 PM [facility] VASC 3 [facility]IMG None  03/19/2021  3:00 PM Kris Hartmann, NP [facility] None  03/21/2021 11:00 AM [facility]  03/24/2021  3:00 PM Milinda Pointer, MD [facility] None  05/26/2021  2:45 PM [name], MD [facility] None    Note by: Gaspar Cola, MD Date: 03/03/2021; Time: 4:50 PM

## 2021-03-07 ENCOUNTER — Other Ambulatory Visit
Admission: RE | Admit: 2021-03-07 | Discharge: 2021-03-07 | Disposition: A | Discharge status: Home / Self Care | Payer: Medicare Other | Source: Ambulatory Visit | Attending: Gastroenterology | Admitting: Gastroenterology

## 2021-03-07 ENCOUNTER — Other Ambulatory Visit: Payer: Self-pay

## 2021-03-07 DIAGNOSIS — Z20822 Contact with and (suspected) exposure to covid-19: Secondary | ICD-10-CM | POA: Insufficient documentation

## 2021-03-07 DIAGNOSIS — Z01812 Encounter for preprocedural laboratory examination: Secondary | ICD-10-CM | POA: Diagnosis present

## 2021-03-08 LAB — SARS CORONAVIRUS 2 (TAT 6-24 HRS): SARS Coronavirus 2: NEGATIVE

## 2021-03-10 ENCOUNTER — Telehealth: Payer: Self-pay

## 2021-03-10 LAB — COMP. METABOLIC PANEL (12)
AST: 207 IU/L — ABNORMAL HIGH (ref 0–40)
Albumin/Globulin Ratio: 1.6 (ref 1.2–2.2)
Albumin: 4.3 g/dL (ref 3.7–4.7)
Alkaline Phosphatase: 162 IU/L — ABNORMAL HIGH (ref 44–121)
BUN/Creatinine Ratio: 31 — ABNORMAL HIGH (ref 10–24)
BUN: 50 mg/dL — ABNORMAL HIGH (ref 8–27)
Bilirubin Total: 0.3 mg/dL (ref 0.0–1.2)
Calcium: 8.7 mg/dL (ref 8.6–10.2)
Chloride: 100 mmol/L (ref 96–106)
Creatinine, Ser: 1.6 mg/dL — ABNORMAL HIGH (ref 0.76–1.27)
Globulin, Total: 2.7 g/dL (ref 1.5–4.5)
Glucose: 207 mg/dL — ABNORMAL HIGH (ref 65–99)
Potassium: 4.8 mmol/L (ref 3.5–5.2)
Sodium: 135 mmol/L (ref 134–144)
Total Protein: 7 g/dL (ref 6.0–8.5)
eGFR: 44 mL/min/{1.73_m2} — ABNORMAL LOW (ref >59)

## 2021-03-10 LAB — PROTIME-INR
INR: 1.1 (ref 0.9–1.2)
Prothrombin Time: 11.2 s (ref 9.1–12.0)

## 2021-03-10 LAB — APTT: aPTT: 30 s (ref 24–33)

## 2021-03-10 LAB — 25-HYDROXY VITAMIN D LCMS D2+D3
25-Hydroxy, Vitamin D-2: 1 ng/mL
25-Hydroxy, Vitamin D-3: 31 ng/mL
25-Hydroxy, Vitamin D: 32 ng/mL

## 2021-03-10 LAB — SEDIMENTATION RATE: Sed Rate: 49 mm/hr — ABNORMAL HIGH (ref 0–30)

## 2021-03-10 LAB — VITAMIN B12: Vitamin B-12: 1349 pg/mL — ABNORMAL HIGH (ref 232–1245)

## 2021-03-10 LAB — C-REACTIVE PROTEIN: CRP: 3 mg/L (ref 0–10)

## 2021-03-10 LAB — PLATELET COUNT: Platelets: 178 10*3/uL (ref 150–450)

## 2021-03-10 LAB — MAGNESIUM: Magnesium: 2 mg/dL (ref 1.6–2.3)

## 2021-03-10 NOTE — Telephone Encounter (Signed)
Patient did not stop blood thinner prior to EGD scheduled for tomorrow.  Holding instructions not noted in chart.  Will send blood thinner request to Dr. Bunnie Domino office prior to rescheduling his procedure.  Wife states her husband just started Plavix.  Once blood thinner request is received back from Dr. Bunnie Domino office I will reschedule EGD.  Thanks,  [place], [place]

## 2021-03-11 ENCOUNTER — Ambulatory Visit: Admission: RE | Admit: 2021-03-11 | Payer: Medicare Other | Source: Home / Self Care | Admitting: Gastroenterology

## 2021-03-11 ENCOUNTER — Encounter: Admission: RE | Payer: Self-pay | Source: Home / Self Care

## 2021-03-11 LAB — COMPLIANCE DRUG ANALYSIS, UR

## 2021-03-11 SURGERY — ESOPHAGOGASTRODUODENOSCOPY (EGD) WITH PROPOFOL
Anesthesia: General

## 2021-03-17 ENCOUNTER — Ambulatory Visit
Admission: RE | Admit: 2021-03-17 | Discharge: 2021-03-17 | Disposition: A | Discharge status: Home / Self Care | Payer: 59 | Source: Ambulatory Visit | Attending: Nephrology | Admitting: Nephrology

## 2021-03-17 ENCOUNTER — Other Ambulatory Visit: Payer: Self-pay

## 2021-03-17 DIAGNOSIS — N1832 Chronic kidney disease, stage 3b: Secondary | ICD-10-CM

## 2021-03-17 DIAGNOSIS — R8290 Unspecified abnormal findings in urine: Secondary | ICD-10-CM | POA: Insufficient documentation

## 2021-03-17 DIAGNOSIS — E1122 Type 2 diabetes mellitus with diabetic chronic kidney disease: Secondary | ICD-10-CM

## 2021-03-18 ENCOUNTER — Other Ambulatory Visit (INDEPENDENT_AMBULATORY_CARE_PROVIDER_SITE_OTHER): Payer: Self-pay | Admitting: Vascular Surgery

## 2021-03-18 ENCOUNTER — Other Ambulatory Visit (INDEPENDENT_AMBULATORY_CARE_PROVIDER_SITE_OTHER): Payer: Self-pay | Admitting: Nurse Practitioner

## 2021-03-18 DIAGNOSIS — Z9862 Peripheral vascular angioplasty status: Secondary | ICD-10-CM

## 2021-03-18 DIAGNOSIS — I70239 Atherosclerosis of native arteries of right leg with ulceration of unspecified site: Secondary | ICD-10-CM

## 2021-03-19 ENCOUNTER — Ambulatory Visit (INDEPENDENT_AMBULATORY_CARE_PROVIDER_SITE_OTHER): Payer: 59 | Admitting: Nurse Practitioner

## 2021-03-19 ENCOUNTER — Encounter (INDEPENDENT_AMBULATORY_CARE_PROVIDER_SITE_OTHER): Payer: Self-pay | Admitting: Nurse Practitioner

## 2021-03-19 ENCOUNTER — Ambulatory Visit (INDEPENDENT_AMBULATORY_CARE_PROVIDER_SITE_OTHER): Payer: 59

## 2021-03-19 ENCOUNTER — Other Ambulatory Visit: Payer: Self-pay

## 2021-03-19 VITALS — BP 152/65 | HR 72 | Resp 16 | Wt 252.0 lb

## 2021-03-19 DIAGNOSIS — E785 Hyperlipidemia, unspecified: Secondary | ICD-10-CM

## 2021-03-19 DIAGNOSIS — I10 Essential (primary) hypertension: Secondary | ICD-10-CM | POA: Diagnosis not present

## 2021-03-19 DIAGNOSIS — E119 Type 2 diabetes mellitus without complications: Secondary | ICD-10-CM | POA: Diagnosis not present

## 2021-03-19 DIAGNOSIS — I70239 Atherosclerosis of native arteries of right leg with ulceration of unspecified site: Secondary | ICD-10-CM | POA: Diagnosis not present

## 2021-03-19 DIAGNOSIS — Z9862 Peripheral vascular angioplasty status: Secondary | ICD-10-CM

## 2021-03-19 DIAGNOSIS — E1169 Type 2 diabetes mellitus with other specified complication: Secondary | ICD-10-CM | POA: Diagnosis not present

## 2021-03-20 ENCOUNTER — Inpatient Hospital Stay: Payer: 59 | Admitting: Certified Registered"

## 2021-03-20 ENCOUNTER — Emergency Department: Payer: 59

## 2021-03-20 ENCOUNTER — Inpatient Hospital Stay
Admission: EM | Admit: 2021-03-20 | Discharge: 2021-03-28 | DRG: 617 | Disposition: A | Discharge status: Home Health | Payer: 59 | Attending: Internal Medicine | Admitting: Internal Medicine

## 2021-03-20 ENCOUNTER — Encounter
Admission: EM | Disposition: A | Discharge status: Home / Self Care | Payer: Self-pay | Source: Home / Self Care | Attending: Internal Medicine

## 2021-03-20 ENCOUNTER — Other Ambulatory Visit: Payer: Self-pay

## 2021-03-20 DIAGNOSIS — L02611 Cutaneous abscess of right foot: Secondary | ICD-10-CM | POA: Diagnosis present

## 2021-03-20 DIAGNOSIS — I872 Venous insufficiency (chronic) (peripheral): Secondary | ICD-10-CM | POA: Diagnosis present

## 2021-03-20 DIAGNOSIS — Z20822 Contact with and (suspected) exposure to covid-19: Secondary | ICD-10-CM | POA: Diagnosis present

## 2021-03-20 DIAGNOSIS — M869 Osteomyelitis, unspecified: Secondary | ICD-10-CM | POA: Diagnosis not present

## 2021-03-20 DIAGNOSIS — Z79899 Other long term (current) drug therapy: Secondary | ICD-10-CM

## 2021-03-20 DIAGNOSIS — R7881 Bacteremia: Secondary | ICD-10-CM | POA: Diagnosis present

## 2021-03-20 DIAGNOSIS — J449 Chronic obstructive pulmonary disease, unspecified: Secondary | ICD-10-CM | POA: Diagnosis present

## 2021-03-20 DIAGNOSIS — L0390 Cellulitis, unspecified: Secondary | ICD-10-CM | POA: Diagnosis not present

## 2021-03-20 DIAGNOSIS — Z953 Presence of xenogenic heart valve: Secondary | ICD-10-CM

## 2021-03-20 DIAGNOSIS — N183 Chronic kidney disease, stage 3 unspecified: Secondary | ICD-10-CM

## 2021-03-20 DIAGNOSIS — R42 Dizziness and giddiness: Secondary | ICD-10-CM | POA: Diagnosis present

## 2021-03-20 DIAGNOSIS — I712 Thoracic aortic aneurysm, without rupture: Secondary | ICD-10-CM | POA: Diagnosis present

## 2021-03-20 DIAGNOSIS — F32A Depression, unspecified: Secondary | ICD-10-CM | POA: Diagnosis present

## 2021-03-20 DIAGNOSIS — M86171 Other acute osteomyelitis, right ankle and foot: Secondary | ICD-10-CM | POA: Diagnosis present

## 2021-03-20 DIAGNOSIS — B9561 Methicillin susceptible Staphylococcus aureus infection as the cause of diseases classified elsewhere: Secondary | ICD-10-CM | POA: Diagnosis present

## 2021-03-20 DIAGNOSIS — I495 Sick sinus syndrome: Secondary | ICD-10-CM | POA: Diagnosis present

## 2021-03-20 DIAGNOSIS — L03031 Cellulitis of right toe: Secondary | ICD-10-CM | POA: Diagnosis present

## 2021-03-20 DIAGNOSIS — I2510 Atherosclerotic heart disease of native coronary artery without angina pectoris: Secondary | ICD-10-CM | POA: Diagnosis present

## 2021-03-20 DIAGNOSIS — Z9884 Bariatric surgery status: Secondary | ICD-10-CM

## 2021-03-20 DIAGNOSIS — G894 Chronic pain syndrome: Secondary | ICD-10-CM | POA: Diagnosis present

## 2021-03-20 DIAGNOSIS — E1169 Type 2 diabetes mellitus with other specified complication: Secondary | ICD-10-CM | POA: Diagnosis present

## 2021-03-20 DIAGNOSIS — I152 Hypertension secondary to endocrine disorders: Secondary | ICD-10-CM | POA: Diagnosis present

## 2021-03-20 DIAGNOSIS — E785 Hyperlipidemia, unspecified: Secondary | ICD-10-CM | POA: Diagnosis present

## 2021-03-20 DIAGNOSIS — D649 Anemia, unspecified: Secondary | ICD-10-CM

## 2021-03-20 DIAGNOSIS — L97514 Non-pressure chronic ulcer of other part of right foot with necrosis of bone: Secondary | ICD-10-CM | POA: Diagnosis present

## 2021-03-20 DIAGNOSIS — I739 Peripheral vascular disease, unspecified: Secondary | ICD-10-CM | POA: Diagnosis not present

## 2021-03-20 DIAGNOSIS — E1129 Type 2 diabetes mellitus with other diabetic kidney complication: Secondary | ICD-10-CM | POA: Diagnosis not present

## 2021-03-20 DIAGNOSIS — Z955 Presence of coronary angioplasty implant and graft: Secondary | ICD-10-CM

## 2021-03-20 DIAGNOSIS — E669 Obesity, unspecified: Secondary | ICD-10-CM | POA: Diagnosis present

## 2021-03-20 DIAGNOSIS — Z96653 Presence of artificial knee joint, bilateral: Secondary | ICD-10-CM | POA: Diagnosis present

## 2021-03-20 DIAGNOSIS — Z87891 Personal history of nicotine dependence: Secondary | ICD-10-CM

## 2021-03-20 DIAGNOSIS — I70261 Atherosclerosis of native arteries of extremities with gangrene, right leg: Secondary | ICD-10-CM | POA: Diagnosis not present

## 2021-03-20 DIAGNOSIS — D638 Anemia in other chronic diseases classified elsewhere: Secondary | ICD-10-CM | POA: Diagnosis present

## 2021-03-20 DIAGNOSIS — E1122 Type 2 diabetes mellitus with diabetic chronic kidney disease: Secondary | ICD-10-CM | POA: Diagnosis present

## 2021-03-20 DIAGNOSIS — E1152 Type 2 diabetes mellitus with diabetic peripheral angiopathy with gangrene: Secondary | ICD-10-CM | POA: Diagnosis present

## 2021-03-20 DIAGNOSIS — I25118 Atherosclerotic heart disease of native coronary artery with other forms of angina pectoris: Secondary | ICD-10-CM | POA: Diagnosis present

## 2021-03-20 DIAGNOSIS — I10 Essential (primary) hypertension: Secondary | ICD-10-CM | POA: Diagnosis not present

## 2021-03-20 DIAGNOSIS — E1165 Type 2 diabetes mellitus with hyperglycemia: Secondary | ICD-10-CM | POA: Diagnosis present

## 2021-03-20 DIAGNOSIS — I340 Nonrheumatic mitral (valve) insufficiency: Secondary | ICD-10-CM | POA: Diagnosis not present

## 2021-03-20 DIAGNOSIS — M1990 Unspecified osteoarthritis, unspecified site: Secondary | ICD-10-CM | POA: Diagnosis present

## 2021-03-20 DIAGNOSIS — Z950 Presence of cardiac pacemaker: Secondary | ICD-10-CM

## 2021-03-20 DIAGNOSIS — D6959 Other secondary thrombocytopenia: Secondary | ICD-10-CM | POA: Diagnosis present

## 2021-03-20 DIAGNOSIS — I959 Hypotension, unspecified: Secondary | ICD-10-CM

## 2021-03-20 DIAGNOSIS — Z794 Long term (current) use of insulin: Secondary | ICD-10-CM

## 2021-03-20 DIAGNOSIS — Z7902 Long term (current) use of antithrombotics/antiplatelets: Secondary | ICD-10-CM

## 2021-03-20 DIAGNOSIS — Z833 Family history of diabetes mellitus: Secondary | ICD-10-CM

## 2021-03-20 DIAGNOSIS — Z713 Dietary counseling and surveillance: Secondary | ICD-10-CM

## 2021-03-20 DIAGNOSIS — N400 Enlarged prostate without lower urinary tract symptoms: Secondary | ICD-10-CM | POA: Diagnosis present

## 2021-03-20 DIAGNOSIS — K219 Gastro-esophageal reflux disease without esophagitis: Secondary | ICD-10-CM | POA: Diagnosis not present

## 2021-03-20 DIAGNOSIS — D696 Thrombocytopenia, unspecified: Secondary | ICD-10-CM | POA: Diagnosis not present

## 2021-03-20 DIAGNOSIS — E1140 Type 2 diabetes mellitus with diabetic neuropathy, unspecified: Secondary | ICD-10-CM | POA: Diagnosis present

## 2021-03-20 DIAGNOSIS — M109 Gout, unspecified: Secondary | ICD-10-CM | POA: Diagnosis present

## 2021-03-20 DIAGNOSIS — Z7982 Long term (current) use of aspirin: Secondary | ICD-10-CM

## 2021-03-20 DIAGNOSIS — I252 Old myocardial infarction: Secondary | ICD-10-CM

## 2021-03-20 DIAGNOSIS — Z6831 Body mass index (BMI) 31.0-31.9, adult: Secondary | ICD-10-CM

## 2021-03-20 DIAGNOSIS — N1831 Chronic kidney disease, stage 3a: Secondary | ICD-10-CM | POA: Diagnosis present

## 2021-03-20 DIAGNOSIS — E11621 Type 2 diabetes mellitus with foot ulcer: Secondary | ICD-10-CM | POA: Diagnosis present

## 2021-03-20 HISTORY — PX: AMPUTATION TOE: SHX6595

## 2021-03-20 LAB — GLUCOSE, CAPILLARY
Glucose-Capillary: 148 mg/dL — ABNORMAL HIGH (ref 70–99)
Glucose-Capillary: 186 mg/dL — ABNORMAL HIGH (ref 70–99)
Glucose-Capillary: 156 mg/dL — ABNORMAL HIGH (ref 70–99)
Glucose-Capillary: 170 mg/dL — ABNORMAL HIGH (ref 70–99)

## 2021-03-20 LAB — BLOOD CULTURE ID PANEL (REFLEXED) - BCID2

## 2021-03-20 LAB — COMPREHENSIVE METABOLIC PANEL
ALT: 38 U/L (ref 0–44)
AST: 37 U/L (ref 15–41)
Albumin: 3.8 g/dL (ref 3.5–5.0)
Alkaline Phosphatase: 95 U/L (ref 38–126)
Anion gap: 8 (ref 5–15)
BUN: 48 mg/dL — ABNORMAL HIGH (ref 8–23)
CO2: 18 mmol/L — ABNORMAL LOW (ref 22–32)
Calcium: 8.7 mg/dL — ABNORMAL LOW (ref 8.9–10.3)
Chloride: 110 mmol/L (ref 98–111)
Creatinine, Ser: 1.45 mg/dL — ABNORMAL HIGH (ref 0.61–1.24)
GFR, Estimated: 49 mL/min — ABNORMAL LOW (ref >60)
Glucose, Bld: 172 mg/dL — ABNORMAL HIGH (ref 70–99)
Potassium: 4.4 mmol/L (ref 3.5–5.1)
Sodium: 136 mmol/L (ref 135–145)
Total Bilirubin: 0.6 mg/dL (ref 0.3–1.2)
Total Protein: 7 g/dL (ref 6.5–8.1)

## 2021-03-20 LAB — RESP PANEL BY RT-PCR (FLU A&B, COVID) ARPGX2
Influenza A by PCR: NEGATIVE
Influenza B by PCR: NEGATIVE
SARS Coronavirus 2 by RT PCR: NEGATIVE

## 2021-03-20 LAB — CBC WITH DIFFERENTIAL/PLATELET
Abs Immature Granulocytes: 0.03 10*3/uL (ref 0.00–0.07)
Basophils Absolute: 0 10*3/uL (ref 0.0–0.1)
Basophils Relative: 0 %
Eosinophils Absolute: 0.1 10*3/uL (ref 0.0–0.5)
Eosinophils Relative: 2 %
HCT: 28 % — ABNORMAL LOW (ref 39.0–52.0)
Hemoglobin: 9.6 g/dL — ABNORMAL LOW (ref 13.0–17.0)
Immature Granulocytes: 0 %
Lymphocytes Relative: 10 %
Lymphs Abs: 0.7 10*3/uL (ref 0.7–4.0)
MCH: 31.4 pg (ref 26.0–34.0)
MCHC: 34.3 g/dL (ref 30.0–36.0)
MCV: 91.5 fL (ref 80.0–100.0)
Monocytes Absolute: 0.5 10*3/uL (ref 0.1–1.0)
Monocytes Relative: 8 %
Neutro Abs: 5.5 10*3/uL (ref 1.7–7.7)
Neutrophils Relative %: 80 %
Platelets: 95 10*3/uL — ABNORMAL LOW (ref 150–400)
RBC: 3.06 MIL/uL — ABNORMAL LOW (ref 4.22–5.81)
RDW: 13.8 % (ref 11.5–15.5)
WBC: 6.9 10*3/uL (ref 4.0–10.5)
nRBC: 0 % (ref 0.0–0.2)

## 2021-03-20 LAB — CBG MONITORING, ED: Glucose-Capillary: 253 mg/dL — ABNORMAL HIGH (ref 70–99)

## 2021-03-20 LAB — PROTIME-INR
INR: 1.2 (ref 0.8–1.2)
Prothrombin Time: 15.1 seconds (ref 11.4–15.2)

## 2021-03-20 LAB — C-REACTIVE PROTEIN: CRP: 11.6 mg/dL — ABNORMAL HIGH (ref <1.0)

## 2021-03-20 LAB — SEDIMENTATION RATE: Sed Rate: 70 mm/hr — ABNORMAL HIGH (ref 0–20)

## 2021-03-20 LAB — BRAIN NATRIURETIC PEPTIDE: B Natriuretic Peptide: 155.6 pg/mL — ABNORMAL HIGH (ref 0.0–100.0)

## 2021-03-20 LAB — PROCALCITONIN: Procalcitonin: 0.61 ng/mL

## 2021-03-20 LAB — LACTIC ACID, PLASMA: Lactic Acid, Venous: 0.9 mmol/L (ref 0.5–1.9)

## 2021-03-20 SURGERY — AMPUTATION, TOE
Anesthesia: General | Site: Toe | Laterality: Right

## 2021-03-20 MED ORDER — FENTANYL CITRATE (PF) 100 MCG/2ML IJ SOLN
50.0000 ug | Freq: Once | INTRAMUSCULAR | Status: AC
  Administered 2021-03-20: 50 ug via INTRAVENOUS
  Filled 2021-03-20: qty 2

## 2021-03-20 MED ORDER — LORATADINE 10 MG PO TABS
10.0000 mg | ORAL_TABLET | Freq: Every day | ORAL | Status: DC
  Administered 2021-03-20 – 2021-03-28 (×9): 10 mg via ORAL
  Filled 2021-03-20 (×9): qty 1

## 2021-03-20 MED ORDER — LACTATED RINGERS IV BOLUS
500.0000 mL | Freq: Once | INTRAVENOUS | Status: AC
  Administered 2021-03-20: 500 mL via INTRAVENOUS

## 2021-03-20 MED ORDER — ROSUVASTATIN CALCIUM 20 MG PO TABS
40.0000 mg | ORAL_TABLET | Freq: Every day | ORAL | Status: DC
  Administered 2021-03-20 – 2021-03-28 (×9): 40 mg via ORAL
  Filled 2021-03-20: qty 2
  Filled 2021-03-20: qty 8
  Filled 2021-03-20: qty 2
  Filled 2021-03-20: qty 4
  Filled 2021-03-20: qty 2
  Filled 2021-03-20 (×8): qty 4

## 2021-03-20 MED ORDER — FENTANYL CITRATE (PF) 100 MCG/2ML IJ SOLN
INTRAMUSCULAR | Status: DC | PRN
  Administered 2021-03-20: 25 ug via INTRAVENOUS

## 2021-03-20 MED ORDER — VANCOMYCIN HCL 1000 MG/200ML IV SOLN
1000.0000 mg | INTRAVENOUS | Status: DC
  Administered 2021-03-21: 1000 mg via INTRAVENOUS
  Filled 2021-03-20: qty 200

## 2021-03-20 MED ORDER — DM-GUAIFENESIN ER 30-600 MG PO TB12
1.0000 | ORAL_TABLET | Freq: Two times a day (BID) | ORAL | Status: DC | PRN
  Administered 2021-03-28: 1 via ORAL
  Filled 2021-03-20 (×2): qty 1

## 2021-03-20 MED ORDER — ADULT MULTIVITAMIN W/MINERALS CH
1.0000 | ORAL_TABLET | Freq: Every day | ORAL | Status: DC
  Administered 2021-03-20 – 2021-03-28 (×9): 1 via ORAL
  Filled 2021-03-20 (×9): qty 1

## 2021-03-20 MED ORDER — PROPOFOL 10 MG/ML IV BOLUS
INTRAVENOUS | Status: DC | PRN
  Administered 2021-03-20: 30 mg via INTRAVENOUS

## 2021-03-20 MED ORDER — PROPOFOL 500 MG/50ML IV EMUL
INTRAVENOUS | Status: DC | PRN
  Administered 2021-03-20: 50 ug/kg/min via INTRAVENOUS

## 2021-03-20 MED ORDER — ASPIRIN EC 81 MG PO TBEC
81.0000 mg | DELAYED_RELEASE_TABLET | Freq: Every day | ORAL | Status: DC
  Administered 2021-03-20 – 2021-03-28 (×9): 81 mg via ORAL
  Filled 2021-03-20 (×9): qty 1

## 2021-03-20 MED ORDER — VITAMIN D 25 MCG (1000 UNIT) PO TABS
5000.0000 [IU] | ORAL_TABLET | Freq: Every day | ORAL | Status: DC
  Administered 2021-03-20 – 2021-03-28 (×9): 5000 [IU] via ORAL
  Filled 2021-03-20 (×9): qty 5

## 2021-03-20 MED ORDER — ISOSORBIDE MONONITRATE ER 30 MG PO TB24
30.0000 mg | ORAL_TABLET | Freq: Every day | ORAL | Status: DC
  Administered 2021-03-20 – 2021-03-28 (×9): 30 mg via ORAL
  Filled 2021-03-20 (×9): qty 1

## 2021-03-20 MED ORDER — VANCOMYCIN HCL IN DEXTROSE 1-5 GM/200ML-% IV SOLN
1000.0000 mg | Freq: Once | INTRAVENOUS | Status: AC
  Administered 2021-03-20: 1000 mg via INTRAVENOUS
  Filled 2021-03-20: qty 200

## 2021-03-20 MED ORDER — INSULIN ASPART 100 UNIT/ML ~~LOC~~ SOLN
0.0000 [IU] | Freq: Every day | SUBCUTANEOUS | Status: DC
  Administered 2021-03-21: 2 [IU] via SUBCUTANEOUS
  Administered 2021-03-25: 4 [IU] via SUBCUTANEOUS
  Administered 2021-03-26: 5 [IU] via SUBCUTANEOUS
  Administered 2021-03-27: 2 [IU] via SUBCUTANEOUS
  Filled 2021-03-20 (×4): qty 1

## 2021-03-20 MED ORDER — ONDANSETRON HCL 4 MG/2ML IJ SOLN: 4.0000 mg | Freq: Once | INTRAMUSCULAR | Status: DC | PRN

## 2021-03-20 MED ORDER — DOCUSATE SODIUM 100 MG PO CAPS
200.0000 mg | ORAL_CAPSULE | Freq: Two times a day (BID) | ORAL | Status: DC
  Administered 2021-03-20 – 2021-03-27 (×14): 200 mg via ORAL
  Filled 2021-03-20 (×17): qty 2

## 2021-03-20 MED ORDER — HYDROCODONE-ACETAMINOPHEN 7.5-325 MG PO TABS
1.0000 | ORAL_TABLET | Freq: Four times a day (QID) | ORAL | Status: DC | PRN
  Administered 2021-03-20 – 2021-03-21 (×4): 2 via ORAL
  Administered 2021-03-21: 1 via ORAL
  Administered 2021-03-21 – 2021-03-26 (×8): 2 via ORAL
  Administered 2021-03-26 – 2021-03-28 (×3): 1 via ORAL
  Filled 2021-03-20: qty 1
  Filled 2021-03-20 (×4): qty 2
  Filled 2021-03-20: qty 1
  Filled 2021-03-20: qty 2
  Filled 2021-03-20: qty 1
  Filled 2021-03-20 (×4): qty 2
  Filled 2021-03-20: qty 1
  Filled 2021-03-20 (×3): qty 2

## 2021-03-20 MED ORDER — ONDANSETRON HCL 4 MG/2ML IJ SOLN: 4.0000 mg | Freq: Three times a day (TID) | INTRAMUSCULAR | Status: DC | PRN

## 2021-03-20 MED ORDER — FENTANYL CITRATE (PF) 100 MCG/2ML IJ SOLN: 25.0000 ug | INTRAMUSCULAR | Status: DC | PRN

## 2021-03-20 MED ORDER — DIPHENOXYLATE-ATROPINE 2.5-0.025 MG PO TABS
1.0000 | ORAL_TABLET | Freq: Four times a day (QID) | ORAL | Status: DC | PRN
  Administered 2021-03-25: 1 via ORAL
  Filled 2021-03-20: qty 1

## 2021-03-20 MED ORDER — PROPOFOL 10 MG/ML IV BOLUS
INTRAVENOUS | Status: AC
  Filled 2021-03-20: qty 20

## 2021-03-20 MED ORDER — LACTATED RINGERS IV SOLN: INTRAVENOUS | Status: DC

## 2021-03-20 MED ORDER — FENTANYL CITRATE (PF) 100 MCG/2ML IJ SOLN
INTRAMUSCULAR | Status: AC
  Filled 2021-03-20: qty 2

## 2021-03-20 MED ORDER — BACLOFEN 10 MG PO TABS
10.0000 mg | ORAL_TABLET | Freq: Two times a day (BID) | ORAL | Status: DC
  Administered 2021-03-20 – 2021-03-28 (×16): 10 mg via ORAL
  Filled 2021-03-20 (×19): qty 1

## 2021-03-20 MED ORDER — LINACLOTIDE 145 MCG PO CAPS
145.0000 ug | ORAL_CAPSULE | Freq: Every day | ORAL | Status: DC
  Administered 2021-03-21 – 2021-03-28 (×8): 145 ug via ORAL
  Filled 2021-03-20 (×9): qty 1

## 2021-03-20 MED ORDER — INSULIN ASPART 100 UNIT/ML ~~LOC~~ SOLN
0.0000 [IU] | Freq: Three times a day (TID) | SUBCUTANEOUS | Status: DC
  Administered 2021-03-20: 5 [IU] via SUBCUTANEOUS
  Administered 2021-03-21 (×2): 3 [IU] via SUBCUTANEOUS
  Administered 2021-03-21: 5 [IU] via SUBCUTANEOUS
  Administered 2021-03-22: 9 [IU] via SUBCUTANEOUS
  Administered 2021-03-22: 1 [IU] via SUBCUTANEOUS
  Administered 2021-03-22: 7 [IU] via SUBCUTANEOUS
  Administered 2021-03-23 (×2): 3 [IU] via SUBCUTANEOUS
  Administered 2021-03-23: 1 [IU] via SUBCUTANEOUS
  Administered 2021-03-24: 2 [IU] via SUBCUTANEOUS
  Administered 2021-03-24 – 2021-03-25 (×2): 3 [IU] via SUBCUTANEOUS
  Administered 2021-03-27 (×2): 2 [IU] via SUBCUTANEOUS
  Administered 2021-03-27: 3 [IU] via SUBCUTANEOUS
  Administered 2021-03-28: 5 [IU] via SUBCUTANEOUS
  Filled 2021-03-20 (×17): qty 1

## 2021-03-20 MED ORDER — ACETAMINOPHEN 325 MG PO TABS
650.0000 mg | ORAL_TABLET | Freq: Four times a day (QID) | ORAL | Status: DC | PRN
  Administered 2021-03-20 – 2021-03-21 (×2): 650 mg via ORAL
  Filled 2021-03-20 (×2): qty 2

## 2021-03-20 MED ORDER — ALLOPURINOL 100 MG PO TABS
100.0000 mg | ORAL_TABLET | Freq: Every day | ORAL | Status: DC
  Administered 2021-03-21 – 2021-03-28 (×8): 100 mg via ORAL
  Filled 2021-03-20 (×9): qty 1

## 2021-03-20 MED ORDER — INSULIN GLARGINE 100 UNIT/ML ~~LOC~~ SOLN
15.0000 [IU] | Freq: Every day | SUBCUTANEOUS | Status: DC
  Administered 2021-03-20: 15 [IU] via SUBCUTANEOUS
  Filled 2021-03-20 (×2): qty 0.15

## 2021-03-20 MED ORDER — BUPIVACAINE HCL 0.5 % IJ SOLN
INTRAMUSCULAR | Status: DC | PRN
  Administered 2021-03-20: 10 mL

## 2021-03-20 MED ORDER — SERTRALINE HCL 50 MG PO TABS
200.0000 mg | ORAL_TABLET | Freq: Every day | ORAL | Status: DC
  Administered 2021-03-20 – 2021-03-28 (×9): 200 mg via ORAL
  Filled 2021-03-20 (×9): qty 4

## 2021-03-20 MED ORDER — METRONIDAZOLE IN NACL 5-0.79 MG/ML-% IV SOLN
500.0000 mg | Freq: Three times a day (TID) | INTRAVENOUS | Status: DC
  Administered 2021-03-20 – 2021-03-21 (×4): 500 mg via INTRAVENOUS
  Filled 2021-03-20 (×6): qty 100

## 2021-03-20 MED ORDER — CLOPIDOGREL BISULFATE 75 MG PO TABS
75.0000 mg | ORAL_TABLET | Freq: Every day | ORAL | Status: DC
  Administered 2021-03-20 – 2021-03-28 (×9): 75 mg via ORAL
  Filled 2021-03-20 (×9): qty 1

## 2021-03-20 MED ORDER — GABAPENTIN 100 MG PO CAPS
100.0000 mg | ORAL_CAPSULE | Freq: Every day | ORAL | Status: DC
  Administered 2021-03-20 – 2021-03-28 (×9): 100 mg via ORAL
  Filled 2021-03-20 (×9): qty 1

## 2021-03-20 MED ORDER — HYDRALAZINE HCL 20 MG/ML IJ SOLN
5.0000 mg | INTRAMUSCULAR | Status: DC | PRN
  Administered 2021-03-24: 5 mg via INTRAVENOUS
  Filled 2021-03-20: qty 1

## 2021-03-20 MED ORDER — VANCOMYCIN HCL IN DEXTROSE 1-5 GM/200ML-% IV SOLN
1000.0000 mg | INTRAVENOUS | Status: DC
  Administered 2021-03-20: 1000 mg via INTRAVENOUS
  Filled 2021-03-20 (×2): qty 200

## 2021-03-20 MED ORDER — TAMSULOSIN HCL 0.4 MG PO CAPS
0.4000 mg | ORAL_CAPSULE | Freq: Every day | ORAL | Status: DC
  Administered 2021-03-20 – 2021-03-28 (×9): 0.4 mg via ORAL
  Filled 2021-03-20 (×9): qty 1

## 2021-03-20 MED ORDER — SODIUM CHLORIDE 0.9 % IV SOLN: INTRAVENOUS | Status: DC | PRN

## 2021-03-20 MED ORDER — SENNOSIDES-DOCUSATE SODIUM 8.6-50 MG PO TABS: 1.0000 | ORAL_TABLET | Freq: Every evening | ORAL | Status: DC | PRN

## 2021-03-20 MED ORDER — PANTOPRAZOLE SODIUM 40 MG PO TBEC
40.0000 mg | DELAYED_RELEASE_TABLET | Freq: Every day | ORAL | Status: DC
  Administered 2021-03-20 – 2021-03-28 (×9): 40 mg via ORAL
  Filled 2021-03-20 (×9): qty 1

## 2021-03-20 MED ORDER — PIPERACILLIN-TAZOBACTAM 3.375 G IVPB 30 MIN
3.3750 g | Freq: Once | INTRAVENOUS | Status: AC
  Administered 2021-03-20: 3.375 g via INTRAVENOUS
  Filled 2021-03-20: qty 50

## 2021-03-20 MED ORDER — SODIUM CHLORIDE 0.9 % IV SOLN
2.0000 g | INTRAVENOUS | Status: DC
  Administered 2021-03-20 – 2021-03-21 (×2): 2 g via INTRAVENOUS
  Filled 2021-03-20: qty 2
  Filled 2021-03-20: qty 20

## 2021-03-20 MED ORDER — CALCIUM CITRATE-VITAMIN D 315-200 MG-UNIT PO TABS: 1.0000 | ORAL_TABLET | Freq: Every day | ORAL | Status: DC

## 2021-03-20 MED ORDER — ALBUTEROL SULFATE HFA 108 (90 BASE) MCG/ACT IN AERS
2.0000 | INHALATION_SPRAY | RESPIRATORY_TRACT | Status: DC | PRN
  Filled 2021-03-20: qty 6.7

## 2021-03-20 SURGICAL SUPPLY — 46 items
BLADE MED AGGRESSIVE (BLADE) ×2 IMPLANT
BLADE OSC/SAGITTAL MD 5.5X18 (BLADE) ×2 IMPLANT
BLADE SURG 15 STRL LF DISP TIS (BLADE) ×2 IMPLANT
BLADE SURG 15 STRL SS (BLADE) ×4
BLADE SURG MINI STRL (BLADE) ×2 IMPLANT
BNDG CONFORM 2 STRL LF (GAUZE/BANDAGES/DRESSINGS) ×2 IMPLANT
BNDG ELASTIC 4X5.8 VLCR STR LF (GAUZE/BANDAGES/DRESSINGS) ×2 IMPLANT
BNDG ESMARK 4X12 TAN STRL LF (GAUZE/BANDAGES/DRESSINGS) ×2 IMPLANT
BNDG GAUZE 4.5X4.1 6PLY STRL (MISCELLANEOUS) ×3 IMPLANT
CANISTER SUCT 1200ML W/VALVE (MISCELLANEOUS) ×2 IMPLANT
COVER WAND RF STERILE (DRAPES) ×2 IMPLANT
CUFF TOURN SGL QUICK 18X4 (TOURNIQUET CUFF) ×2 IMPLANT
DURAPREP 26ML APPLICATOR (WOUND CARE) ×2 IMPLANT
ELECT REM PT RETURN 9FT ADLT (ELECTROSURGICAL) ×2
ELECTRODE REM PT RTRN 9FT ADLT (ELECTROSURGICAL) ×1 IMPLANT
GAUZE SPONGE 4X4 12PLY STRL (GAUZE/BANDAGES/DRESSINGS) ×3 IMPLANT
GAUZE XEROFORM 1X8 LF (GAUZE/BANDAGES/DRESSINGS) ×1 IMPLANT
GLOVE SURG ENC MOIS LTX SZ7.5 (GLOVE) ×2 IMPLANT
GLOVE SURG UNDER LTX SZ8 (GLOVE) ×2 IMPLANT
GOWN STRL REUS W/ TWL LRG LVL3 (GOWN DISPOSABLE) ×2 IMPLANT
GOWN STRL REUS W/TWL LRG LVL3 (GOWN DISPOSABLE) ×4
HANDPIECE VERSAJET DEBRIDEMENT (MISCELLANEOUS) ×2 IMPLANT
KIT TURNOVER KIT A (KITS) ×2 IMPLANT
LABEL OR SOLS (LABEL) ×2 IMPLANT
MANIFOLD NEPTUNE II (INSTRUMENTS) ×2 IMPLANT
NDL FILTER BLUNT 18X1 1/2 (NEEDLE) ×1 IMPLANT
NDL HYPO 25X1 1.5 SAFETY (NEEDLE) ×2 IMPLANT
NEEDLE FILTER BLUNT 18X 1/2SAF (NEEDLE) ×1
NEEDLE FILTER BLUNT 18X1 1/2 (NEEDLE) ×1 IMPLANT
NEEDLE HYPO 25X1 1.5 SAFETY (NEEDLE) ×4 IMPLANT
NS IRRIG 500ML POUR BTL (IV SOLUTION) ×2 IMPLANT
PACK EXTREMITY ARMC (MISCELLANEOUS) ×2 IMPLANT
PAD ABD DERMACEA PRESS 5X9 (GAUZE/BANDAGES/DRESSINGS) ×2 IMPLANT
SOL PREP PVP 2OZ (MISCELLANEOUS) ×2
SOLUTION PREP PVP 2OZ (MISCELLANEOUS) ×1 IMPLANT
STOCKINETTE BIAS CUT 4 980044 (GAUZE/BANDAGES/DRESSINGS) ×2 IMPLANT
STOCKINETTE STRL 6IN 960660 (GAUZE/BANDAGES/DRESSINGS) ×2 IMPLANT
STRIP CLOSURE SKIN 1/4X4 (GAUZE/BANDAGES/DRESSINGS) ×1 IMPLANT
SUT ETH BLK MONO 3 0 FS 1 12/B (SUTURE) ×1 IMPLANT
SUT ETHILON 3-0 FS-10 30 BLK (SUTURE) ×2
SUT ETHILON 4-0 (SUTURE)
SUT ETHILON 4-0 FS2 18XMFL BLK (SUTURE)
SUTURE EHLN 3-0 FS-10 30 BLK (SUTURE) ×1 IMPLANT
SUTURE ETHLN 4-0 FS2 18XMF BLK (SUTURE) IMPLANT
SWAB CULTURE AMIES ANAERIB BLU (MISCELLANEOUS) ×2 IMPLANT
SYR 10ML LL (SYRINGE) ×2 IMPLANT

## 2021-03-20 NOTE — Anesthesia Procedure Notes (Signed)
Procedure Name: MAC Date/Time: 03/20/2021 8:15 PM Performed by: Jerrye Noble, CRNA Pre-anesthesia Checklist: Patient identified, Emergency Drugs available, Suction available and Patient being monitored Patient Re-evaluated:Patient Re-evaluated prior to induction Oxygen Delivery Method: Simple face mask

## 2021-03-20 NOTE — Anesthesia Preprocedure Evaluation (Signed)
Anesthesia Evaluation  Patient identified by MRN, date of birth, ID band Patient awake    Reviewed: Allergy & Precautions, H&P , NPO status , Patient's Chart, lab work & pertinent test results, reviewed documented beta blocker date and time   History of Anesthesia Complications Negative for: history of anesthetic complications  Airway Mallampati: III  TM Distance: >3 FB Neck ROM: full    Dental  (+) Dental Advidsory Given, Poor Dentition, Missing, Chipped   Pulmonary shortness of breath and with exertion, sleep apnea , COPD, neg recent URI, former smoker,    Pulmonary exam normal breath sounds clear to auscultation       Cardiovascular Exercise Tolerance: Good hypertension, (-) angina+ CAD, + Past MI, + Cardiac Stents and + Peripheral Vascular Disease  Normal cardiovascular exam+ dysrhythmias + pacemaker (-) Valvular Problems/Murmurs Rhythm:regular Rate:Normal     Neuro/Psych PSYCHIATRIC DISORDERS Depression Bipolar Disorder negative neurological ROS     GI/Hepatic Neg liver ROS, PUD, GERD  ,  Endo/Other  diabetes  Renal/GU CRFRenal disease  negative genitourinary   Musculoskeletal  (+) Arthritis , Osteoarthritis,    Abdominal   Peds negative pediatric ROS (+)  Hematology negative hematology ROS (+) anemia ,   Anesthesia Other Findings Past Medical History: No date: Aneurysm (arteriovenous) of coronary vessels No date: Arthritis No date: Ascending aortic aneurysm (HCC) No date: CAD (coronary artery disease) No date: Diabetes mellitus without complication (HCC) No date: History of GI bleed No date: Hyperlipidemia associated with type 2 diabetes mellitus (HCC) No date: Hypertension associated with diabetes (Nathan Singleton) No date: Presence of permanent cardiac pacemaker No date: PVD (peripheral vascular disease) (Nathan Singleton) No date: S/P TAVR (transcatheter aortic valve replacement)   Reproductive/Obstetrics negative OB  ROS                             Anesthesia Physical  Anesthesia Plan  ASA: III  Anesthesia Plan: General   Post-op Pain Management:    Induction: Intravenous  PONV Risk Score and Plan: 2 and TIVA and Propofol infusion  Airway Management Planned: Natural Airway and Simple Face Mask  Additional Equipment:   Intra-op Plan:   Post-operative Plan:   Informed Consent: I have reviewed the patients History and Physical, chart, labs and discussed the procedure including the risks, benefits and alternatives for the proposed anesthesia with the patient or authorized representative who has indicated his/her understanding and acceptance.     Dental Advisory Given  Plan Discussed with: Anesthesiologist, CRNA and Surgeon  Anesthesia Plan Comments:         Anesthesia Quick Evaluation

## 2021-03-20 NOTE — Progress Notes (Signed)
CODE SEPSIS - PHARMACY COMMUNICATION  **Broad Spectrum Antibiotics should be administered within 1 hour of Sepsis diagnosis**  Time Code Sepsis Called/Page Received: [id number]  Antibiotics Ordered: Zosyn and Vancomycin  Time of 1st antibiotic administration: 0401  Renda Rolls, PharmD, [facility] 03/20/2021 5:33 AM

## 2021-03-20 NOTE — ED Notes (Signed)
Pt being transported to IP bed 145A now.

## 2021-03-20 NOTE — Progress Notes (Signed)
Subjective/Chief Complaint: Patient seen.  Hospitalized earlier today due to infection in his right great toe.  Previously had surgery performed last month for removal of bone in the right foot.  No evidence of osteomyelitis was noted at that time.  Patient had some bleeding from the toe starting last week and states that he has had some pus coming out more recently.   Objective: Vital signs in last 24 hours: Temp:  [98.3 F (36.8 C)-98.6 F (37 C)] 98.6 F (37 C) (04/07 1158) Pulse Rate:  [59-88] 88 (04/07 1158) Resp:  [16-23] 19 (04/07 1158) BP: (99-152)/(53-92) 141/89 (04/07 1158) SpO2:  [99 %-100 %] 100 % (04/07 1158) Weight:  [105.2 kg-114.3 kg] 105.2 kg (04/07 0332) Last BM Date: 03/19/21  Intake/Output from previous day: No intake/output data recorded. Intake/Output this shift: No intake/output data recorded.  Moderate drainage and purulence noted on the bandaging on the right hallux.  Upon removal significant edema and erythema of the hallux with an open dorsal wound with significant purulence expressed from the toe.  Lab Results:  Recent Labs    03/20/21 0346  WBC 6.9  HGB 9.6*  HCT 28.0*  PLT 95*   BMET Recent Labs    03/20/21 0346  NA 136  K 4.4  CL 110  CO2 18*  GLUCOSE 172*  BUN 48*  CREATININE 1.45*  CALCIUM 8.7*   PT/INR Recent Labs    03/20/21 0346  LABPROT 15.1  INR 1.2   ABG No results for input(s): PHART, HCO3 in the last 72 hours.  Invalid input(s): PCO2, PO2  Studies/Results: DG Foot Complete Right  Result Date: 03/20/2021 CLINICAL DATA:  80 year old male status post right foot surgery for infection a few days ago. Hypotension, dizziness. EXAM: RIGHT FOOT COMPLETE - 3+ VIEW COMPARISON:  Right foot series 02/13/2021. FINDINGS: Right great toe osteotomy about the IP joint which has been resected. Generalized soft tissue swelling in the right great toe and medial foot. No soft tissue gas. No definite cortical osteolysis along the  osteotomy margins. First MTP and other joint spaces appear stable from last month. Calcified peripheral vascular disease. No new osseous abnormality. IMPRESSION: 1. Interval right great toe osteotomy about the IP joint which was resected. No superimposed plain radiographic evidence of osteomyelitis at this time. 2. Generalized soft tissue swelling. Calcified peripheral vascular disease. Electronically Signed   By: Genevie Ann M.D.   On: 03/20/2021 04:45    Anti-infectives: Anti-infectives (From admission, onward)   Start     Dose/Rate Route Frequency Ordered Stop   03/20/21 0800  cefTRIAXone (ROCEPHIN) 2 g in sodium chloride 0.9 % 100 mL IVPB        2 g 200 mL/hr over 30 Minutes Intravenous Every 24 hours 03/20/21 0727     03/20/21 0800  metroNIDAZOLE (FLAGYL) IVPB 500 mg        500 mg 100 mL/hr over 60 Minutes Intravenous Every 8 hours 03/20/21 0727     03/20/21 0800  vancomycin (VANCOCIN) IVPB 1000 mg/200 mL premix        1,000 mg 200 mL/hr over 60 Minutes Intravenous Every 24 hours 03/20/21 0732     03/20/21 0400  piperacillin-tazobactam (ZOSYN) IVPB 3.375 g        3.375 g 100 mL/hr over 30 Minutes Intravenous  Once 03/20/21 0353 03/20/21 0444   03/20/21 0400  vancomycin (VANCOCIN) IVPB 1000 mg/200 mL premix        1,000 mg 200 mL/hr over 60 Minutes Intravenous  Once 03/20/21 0353 03/20/21 0552      Assessment/Plan: s/p * No surgery found * Assessment: 1.  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  Diabetes with neuropathy.   Plan: Sterile gauze bandage reapplied to the hallux.  Discussed with the patient and his wife the need for amputation of the toe due to the amount of infection and recently having some of the bone removed from his toe.  Discussed possible risk and complications of the procedure and anesthesia including but not limited to inability of the toe to heal due to infection or his diabetes.  Possibility of further surgery cannot be eliminated.  Questions invited and  answered.  Obtain consent for amputation right great toe.  N.p.o.  Just finished his lunch so we will plan for surgery tonight around 8:00.  LOS: 0 days    Durward Fortes 03/20/2021

## 2021-03-20 NOTE — H&P (View-Only) (Signed)
Subjective/Chief Complaint: Patient seen.  Hospitalized earlier today due to infection in his right great toe.  Previously had surgery performed last month for removal of bone in the right foot.  No evidence of osteomyelitis was noted at that time.  Patient had some bleeding from the toe starting last week and states that he has had some pus coming out more recently.   Objective: Vital signs in last 24 hours: Temp:  [98.3 F (36.8 C)-98.6 F (37 C)] 98.6 F (37 C) (04/07 1158) Pulse Rate:  [59-88] 88 (04/07 1158) Resp:  [16-23] 19 (04/07 1158) BP: (99-152)/(53-92) 141/89 (04/07 1158) SpO2:  [99 %-100 %] 100 % (04/07 1158) Weight:  [105.2 kg-114.3 kg] 105.2 kg (04/07 0332) Last BM Date: 03/19/21  Intake/Output from previous day: No intake/output data recorded. Intake/Output this shift: No intake/output data recorded.  Moderate drainage and purulence noted on the bandaging on the right hallux.  Upon removal significant edema and erythema of the hallux with an open dorsal wound with significant purulence expressed from the toe.  Lab Results:  Recent Labs    03/20/21 0346  WBC 6.9  HGB 9.6*  HCT 28.0*  PLT 95*   BMET Recent Labs    03/20/21 0346  NA 136  K 4.4  CL 110  CO2 18*  GLUCOSE 172*  BUN 48*  CREATININE 1.45*  CALCIUM 8.7*   PT/INR Recent Labs    03/20/21 0346  LABPROT 15.1  INR 1.2   ABG No results for input(s): PHART, HCO3 in the last 72 hours.  Invalid input(s): PCO2, PO2  Studies/Results: DG Foot Complete Right  Result Date: 03/20/2021 CLINICAL DATA:  80 year old male status post right foot surgery for infection a few days ago. Hypotension, dizziness. EXAM: RIGHT FOOT COMPLETE - 3+ VIEW COMPARISON:  Right foot series 02/13/2021. FINDINGS: Right great toe osteotomy about the IP joint which has been resected. Generalized soft tissue swelling in the right great toe and medial foot. No soft tissue gas. No definite cortical osteolysis along the  osteotomy margins. First MTP and other joint spaces appear stable from last month. Calcified peripheral vascular disease. No new osseous abnormality. IMPRESSION: 1. Interval right great toe osteotomy about the IP joint which was resected. No superimposed plain radiographic evidence of osteomyelitis at this time. 2. Generalized soft tissue swelling. Calcified peripheral vascular disease. Electronically Signed   By: Genevie Ann M.D.   On: 03/20/2021 04:45    Anti-infectives: Anti-infectives (From admission, onward)   Start     Dose/Rate Route Frequency Ordered Stop   03/20/21 0800  cefTRIAXone (ROCEPHIN) 2 g in sodium chloride 0.9 % 100 mL IVPB        2 g 200 mL/hr over 30 Minutes Intravenous Every 24 hours 03/20/21 0727     03/20/21 0800  metroNIDAZOLE (FLAGYL) IVPB 500 mg        500 mg 100 mL/hr over 60 Minutes Intravenous Every 8 hours 03/20/21 0727     03/20/21 0800  vancomycin (VANCOCIN) IVPB 1000 mg/200 mL premix        1,000 mg 200 mL/hr over 60 Minutes Intravenous Every 24 hours 03/20/21 0732     03/20/21 0400  piperacillin-tazobactam (ZOSYN) IVPB 3.375 g        3.375 g 100 mL/hr over 30 Minutes Intravenous  Once 03/20/21 0353 03/20/21 0444   03/20/21 0400  vancomycin (VANCOCIN) IVPB 1000 mg/200 mL premix        1,000 mg 200 mL/hr over 60 Minutes Intravenous  Once 03/20/21 0353 03/20/21 0552      Assessment/Plan: s/p * No surgery found * Assessment: 1.  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  Diabetes with neuropathy.   Plan: Sterile gauze bandage reapplied to the hallux.  Discussed with the patient and his wife the need for amputation of the toe due to the amount of infection and recently having some of the bone removed from his toe.  Discussed possible risk and complications of the procedure and anesthesia including but not limited to inability of the toe to heal due to infection or his diabetes.  Possibility of further surgery cannot be eliminated.  Questions invited and  answered.  Obtain consent for amputation right great toe.  N.p.o.  Just finished his lunch so we will plan for surgery tonight around 8:00.  LOS: 0 days    Durward Fortes 03/20/2021

## 2021-03-20 NOTE — ED Notes (Addendum)
PT/OT currently in room with patient. Patient to be transported to IP bed once finished with therapy.

## 2021-03-20 NOTE — Transfer of Care (Signed)
Immediate Anesthesia Transfer of Care Note  Patient: Nathan Singleton  Procedure(s) Performed: AMPUTATION TOE (Right Toe)  Patient Location: PACU  Anesthesia Type:General  Level of Consciousness: awake, alert  and oriented  Airway & Oxygen Therapy: Patient Spontanous Breathing and Patient connected to face mask oxygen  Post-op Assessment: Report given to RN and Post -op Vital signs reviewed and stable  Post vital signs: Reviewed and stable  Last Vitals:  Vitals Value Taken Time  BP 131/56 03/20/21 2108  Temp    Pulse 61 03/20/21 2109  Resp 18 03/20/21 2109  SpO2 100 % 03/20/21 2109  Vitals shown include unvalidated device data.  Last Pain:  Vitals:   03/20/21 0912  PainSc: 0-No pain         Complications: No complications documented.

## 2021-03-20 NOTE — ED Notes (Signed)
Patient is resting comfortably. Call light in reach. Fall precautions in place.  

## 2021-03-20 NOTE — ED Triage Notes (Signed)
Pt to ED via EMS from home, pt got up to use bathroom and became dizzy pt then took his bp at home and got a reading of 92/52. Per ems all VSS with them. Pt had surgery on right foot for an infection a few days ago.

## 2021-03-20 NOTE — Consult Note (Signed)
Pharmacy Antibiotic Note  Nathan Singleton is a 80 y.o. male admitted on 03/20/2021 with cellulitis.    Pt experiencing progressively worsening swelling, redness, warmth that started in the right big toe and extends to the dorsum of his foot over the last day after completing 10 day course of keflex following discharge on 02/21/21.  Previous hospitalization included  I&D and biopsy on 02/18/2021 that did not show osteomyelitis.  Pharmacy has been consulted for vancomycin dosing.  Plan: Patient received '1000mg'$  Vancomycin in the ED ~ 0400  Will start Vancomycin 1000 mg IV Q 24 hrs.  Goal AUC 400-550. Expected AUC: 445 SCr used: 1.45  Starting dosing now to complete a total of a '2000mg'$  loading dose  Continue Metronidazole and Ceftriaxone as ordered   Height: 6' (182.9 cm) Weight: 105.2 kg (232 lb) IBW/kg (Calculated) : 77.6  Temp (24hrs), Avg:98.3 F (36.8 C), Min:98.3 F (36.8 C), Max:98.3 F (36.8 C)  Recent Labs  Lab 03/20/21 0346 03/20/21 0352  WBC 6.9  --   CREATININE 1.45*  --   LATICACIDVEN  --  0.9    Estimated Creatinine Clearance: 52.6 mL/min (A) (by C-G formula based on SCr of 1.45 mg/dL (H)).    Allergies  Allergen Reactions  . Atorvastatin Other (See Comments)    "bad for kidneys" Cramping   . Celecoxib Other (See Comments)    Other reaction(s): Other (See Comments) Kidney Problem Kidney Problem   . Dicyclomine Hcl Other (See Comments)    Stomach cramps  . Doxycycline Other (See Comments)    Per wife (per RN)- pt gets severe stomach pains even when takes with food and PCP said to not take    . Metformin Other (See Comments)    Antimicrobials this admission: 4/7 Zosyn IV x 1  4/7 Vancomycin >> 4/7 Ceftriaxone >> 4/7 Metronidazole >>  Dose adjustments this admission: n/a  Microbiology results: 4/7 BCx: pending  Thank you for allowing pharmacy to be a part of this patient's care.  Lu Duffel, PharmD, BCPS Clinical  Pharmacist 03/20/2021 7:34 AM

## 2021-03-20 NOTE — ED Provider Notes (Signed)
[facility] Emergency Department Provider Note  ____________________________________________  Time seen: Approximately 4:52 AM  I have reviewed the triage vital signs and the nursing notes.   HISTORY  Chief Complaint Dizziness   HPI Nathan Singleton is a 80 y.o. male with a history of poorly controlled diabetes type 2, aortic stenosis status post TVAR, sick sinus syndrome status post pacemaker, hypertension, anemia, PVD s/p angioplasty of SFA and R DP artery and anterior tibial artery on Plavix, R big toe infection with negative biopsy for osteomyelitis (02/18/21) who presents for evaluation of dizziness.  Patient reports that he has noticed progressively worsening swelling, redness, warmth that started in the right big toe and extends to the dorsum of his foot over the last day.  Yesterday he had subjective fevers.  He reports that he woke up in the middle of the night to go to the bathroom and felt lightheaded like he was going to pass out.  He took his blood pressure at home which was 92/55 and called 911.  He is no longer on antibiotics since his finish his course.  When EMS arrived patient was hypotensive and patient was given fluids in route to the hospital.  Patient is complaining of pain in his toe which has been constant for the last few days.  He denies chest pain or shortness of breath, headache..  Past Medical History:  Diagnosis Date  . Aneurysm (arteriovenous) of coronary vessels   . Arthritis   . Ascending aortic aneurysm ([place])   . CAD (coronary artery disease)   . Diabetes mellitus without complication ([place])   . History of GI bleed   . Hyperlipidemia associated with type 2 diabetes mellitus ([place])   . Hypertension associated with diabetes ([place])   . Presence of permanent cardiac pacemaker   . PVD (peripheral vascular disease) ([place])   . S/P TAVR (transcatheter aortic valve replacement)     Patient Active Problem List   Diagnosis Date Noted  .  Chronic pain syndrome 03/03/2021  . Pharmacologic therapy 03/03/2021  . Disorder of skeletal system 03/03/2021  . Problems influencing health status 03/03/2021  . 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[place]) 03/03/2021  . Chronic lower extremity pain (2ry area of Pain) (Bilateral) (R>L) 03/03/2021  . Chronic anticoagulation (Plavix) 03/03/2021  . Chronic shoulder pain (3ry area of Pain) (Left) 03/03/2021  . 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 Cervical facet hypertrophy (Multilevel) (Bilateral) 03/03/2021  . DDD (degenerative disc disease), cervical 03/03/2021  . BPH (benign prostatic hyperplasia) 02/14/2021  . Diabetic foot ulcer ([place]) 02/13/2021  . Anemia 11/26/2020  . Chronic kidney disease (CKD) 11/26/2020  . Claudication ([place]) 11/26/2020  . Contusion of right thigh 11/26/2020  . COPD (chronic obstructive pulmonary disease) ([place]) 11/26/2020  . Depression 11/26/2020  . Dyspnea 11/26/2020  . Erectile dysfunction 11/26/2020  . GERD without esophagitis 11/26/2020  . Gout 11/26/2020  . Hyperlipidemia, acquired 11/26/2020  . Hyperlipidemia 11/26/2020  . Myocardial infarction ([place]) 11/26/2020  . Personal history of other diseases of urinary system 11/26/2020  . Right leg swelling 11/26/2020  . Sleep apnea 11/26/2020  . Varicose veins 11/26/2020  . Venous insufficiency 11/26/2020  . Vitamin D deficiency 11/26/2020  . Chest pain 11/21/2020  . Thrombocytopenia ([place]) 11/21/2020  . S/P TAVR (transcatheter aortic valve replacement)   . PVD (peripheral vascular disease) ([place])   . Presence of permanent cardiac pacemaker   . Hypertension associated with diabetes ([place])   .  Hyperlipidemia associated with type 2 diabetes mellitus ([place])   . History of GI bleed   . CAD (coronary artery disease)   . 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 Pacemaker St Jude device  11/19/2020  . Peripheral vascular disease, unspecified ([place]) stents to popliteal arteries many years ago 11/19/2020  . History of gingival bleeding 11/19/2020  . Coronary artery disease involving coronary bypass graft of native heart with angina pectoris ([place]) 11/19/2020  . 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 Hypertension   . DM2 (diabetes mellitus, type 2) ([place])   . Arthritis   . Closed nondisplaced fracture of greater trochanter of right femur ([place]) 01/25/2020  . DDD (degenerative disc disease), lumbar 12/10/2019  . Chronic low back pain (1ry area of Pain) (Bilateral) (L>R) w/o sciatica 12/09/2019  . 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 Urinary retention 12/09/2019  . Hearing loss 05/11/2019  . Obesity 04/07/2019  . 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 Bipolar disorder ([place]) 01/01/2019  . 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 Heart block 04/25/2018  . Calculus of gallbladder with acute cholecystitis without obstruction 09/21/2017  . Ulcer of jejunum 03/23/2014  . Insomnia 03/22/2014  . Blisters of multiple sites 01/18/2014  . Cellulitis 01/18/2014  . OA (osteoarthritis) of knee 06/09/2013  . Status post percutaneous transluminal coronary angioplasty 01/14/2011    Past Surgical History:  Procedure Laterality Date  . Ankle repaired Right   . Carpal Tunnel repaired Left   . CATARACT EXTRACTION Bilateral   . COLONOSCOPY    . Copillar implant     . DG CHOLECYSTOGRAPHY GALL BLADDER ([place] HX)    . GASTRIC BYPASS    . heart valve replaced    . IRRIGATION AND DEBRIDEMENT FOOT Right 02/18/2021   Procedure: IRRIGATION AND DEBRIDEMENT FOOT-Right Great Toe;  Surgeon: [name], DPM;  Location: [facility] ORS;  Service: Podiatry;  Laterality: Right;  . LOWER EXTREMITY ANGIOGRAPHY Right 02/17/2021   Procedure: Lower Extremity Angiography;  Surgeon: Algernon Huxley, MD;  Location: [facility];  Service: Cardiovascular;  Laterality: Right;  . PACEMAKER GENERATOR CHANGE    . REPLACEMENT TOTAL KNEE BILATERAL    . RTC    . STENT PLACE LEFT URETER ([facility] HX) Right    Right Leg  . Toe nail removed Bilateral     Prior to Admission medications   Medication Sig Start Date End Date Taking? Authorizing Provider  allopurinol (ZYLOPRIM) 100 MG tablet Take 100 mg by mouth daily. 10/19/20   [provider]  amLODipine (NORVASC) 10 MG tablet Take 10 mg by mouth daily.    [provider]  baclofen (LIORESAL) 10 MG tablet Take 10 mg by mouth 2 (two) times daily. 02/12/21   [provider]  BAYER ASPIRIN EC LOW DOSE 81 MG EC tablet Take 81 mg by mouth daily. 11/11/20   [provider]  calcium citrate-vitamin D (CITRACAL+D) 315-200 MG-UNIT tablet Take 1 tablet by mouth daily. 11/11/20   [provider]  cetirizine (ZYRTEC) 10 MG tablet Take 10 mg by mouth daily. 11/11/20   [provider]  clopidogrel (PLAVIX) 75 MG tablet Take 1 tablet (75 mg total) by mouth daily. 02/22/21   Charlynne Cousins, MD  D-5000 125 MCG (5000 UT) TABS Take 1 tablet by mouth daily. 11/11/20   [provider]  diphenoxylate-atropine (LOMOTIL) 2.5-0.025  MG tablet Take 1 tablet by mouth every 6 (six) hours as needed for diarrhea or loose stools. 01/01/21   [provider]  docusate sodium (COLACE) 100 MG capsule Take 200 mg by mouth 2 (two) times daily. 11/11/20   [provider]  furosemide (LASIX) 20 MG tablet Take 20 mg by mouth daily. 10/19/20   [provider]  gabapentin (NEURONTIN) 100 MG capsule Take 100 mg by mouth daily. 01/29/21   [provider]  HYDROcodone-acetaminophen (NORCO) 7.5-325 MG tablet Take 1-2 tablets by mouth every 6 (six) hours as needed for moderate pain or severe pain. 02/22/21   Charlynne Cousins, MD  insulin glargine (LANTUS) 100 UNIT/ML Solostar Pen Inject 25 Units into the skin at  bedtime.    [provider]  isosorbide mononitrate (IMDUR) 30 MG 24 hr tablet Take 30 mg by mouth daily. 02/12/21   [provider]  LINZESS 145 MCG CAPS capsule Take 145 mcg by mouth daily. 11/11/20   [provider]  liraglutide (VICTOZA) 18 MG/3ML SOPN Inject 1.8 mg into the skin daily.    [provider]  losartan (COZAAR) 25 MG tablet Take 1 tablet (25 mg total) by mouth daily. 02/24/21   Charlynne Cousins, MD  multivitamin (ONE-A-DAY MEN'S) TABS tablet Take 1 tablet by mouth daily. 11/11/20   [provider]  pantoprazole (PROTONIX) 40 MG tablet Take 1 tablet (40 mg total) by mouth daily. 02/11/21   [name], MD  rosuvastatin (CRESTOR) 40 MG tablet Take 40 mg by mouth daily. 02/13/21   [provider]  sertraline (ZOLOFT) 100 MG tablet Take 200 mg by mouth daily. 11/11/20   [provider]  tamsulosin (FLOMAX) 0.4 MG CAPS capsule Take 0.4 mg by mouth daily. 11/11/20   [provider]    Allergies Atorvastatin, Celecoxib, Dicyclomine hcl, Doxycycline, and Metformin  Family History  Problem Relation Age of Onset  . Heart disease Mother   . Alzheimer's disease Mother   . Heart disease Father   . Diabetes Father     Social History Social History   Tobacco Use  . Smoking status: Former Research scientist (life sciences)  . Smokeless tobacco: Never Used  Substance Use Topics  . Alcohol use: Not Currently  . Drug use: Not Currently    Review of Systems  Constitutional: + fever. Eyes: Negative for visual changes. ENT: Negative for sore throat. Neck: No neck pain  Cardiovascular: Negative for chest pain. Respiratory: Negative for shortness of breath. Gastrointestinal: Negative for abdominal pain, vomiting or diarrhea. Genitourinary: Negative for dysuria. Musculoskeletal: Negative for back pain. + R big toe pain Skin: Negative for rash. Neurological: Negative for headaches, weakness or numbness. Psych: No SI or  HI  ____________________________________________   PHYSICAL EXAM:  VITAL SIGNS: ED Triage Vitals  Enc Vitals Group     BP 03/20/21 0334 (!) 99/58     Pulse Rate 03/20/21 0334 63     Resp 03/20/21 0334 18     Temp 03/20/21 0334 98.3 F (36.8 C)     Temp src --      SpO2 03/20/21 0334 99 %     Weight 03/20/21 0332 232 lb (105.2 kg)     Height 03/20/21 0332 6' (1.829 m)     Head Circumference --      Peak Flow --      Pain Score 03/20/21 0332 7     Pain Loc --      Pain Edu? --  Excl. in [place]? --     Constitutional: Alert and oriented. Well appearing and in no apparent distress. HEENT:      Head: Normocephalic and atraumatic.         Eyes: Conjunctivae are normal. Sclera is non-icteric.       Mouth/Throat: Mucous membranes are moist.       Neck: Supple with no signs of meningismus. Cardiovascular: Regular rate and rhythm. No murmurs, gallops, or rubs. 2+ symmetrical distal pulses are present in all extremities. No JVD. Respiratory: Normal respiratory effort. Lungs are clear to auscultation bilaterally.  Gastrointestinal: Soft, non tender. Musculoskeletal:  R big toe is swollen with erythema and warmth extending to the dorsum of the foot. Patient has palpable DP and PT pulses. Neurologic: Normal speech and language. Face is symmetric. Moving all extremities. No gross focal neurologic deficits are appreciated. Skin: Skin is warm, dry and intact. No rash noted. Psychiatric: Mood and affect are normal. Speech and behavior are normal.  ____________________________________________   LABS (all labs ordered are listed, but only abnormal results are displayed)  Labs Reviewed  CBC WITH DIFFERENTIAL/PLATELET - Abnormal; Notable for the following components:      Result Value   RBC 3.06 (*)    Hemoglobin 9.6 (*)    HCT 28.0 (*)    Platelets 95 (*)    All other components within normal limits  COMPREHENSIVE METABOLIC PANEL - Abnormal; Notable for the following components:    CO2 18 (*)    Glucose, Bld 172 (*)    BUN 48 (*)    Creatinine, Ser 1.45 (*)    Calcium 8.7 (*)    GFR, Estimated 49 (*)    All other components within normal limits  RESP PANEL BY RT-PCR (FLU A&B, COVID) ARPGX2  CULTURE, BLOOD (ROUTINE X 2)  CULTURE, BLOOD (ROUTINE X 2)  LACTIC ACID, PLASMA  PROTIME-INR  PROCALCITONIN   ____________________________________________  EKG  ED ECG REPORT I, Rudene Re, the attending physician, personally viewed and interpreted this ECG.  Sinus rhythm, rate of 61, right bundle branch block, normal QTC, normal axis, no ST elevations or depressions.  Unchanged from prior from a month ago ____________________________________________  RADIOLOGY  I have personally reviewed the images performed during this visit and I agree with the Radiologist's read.   Interpretation by Radiologist:  DG Foot Complete Right  Result Date: 03/20/2021 CLINICAL DATA:  80 year old male status post right foot surgery for infection a few days ago. Hypotension, dizziness. EXAM: RIGHT FOOT COMPLETE - 3+ VIEW COMPARISON:  Right foot series 02/13/2021. FINDINGS: Right great toe osteotomy about the IP joint which has been resected. Generalized soft tissue swelling in the right great toe and medial foot. No soft tissue gas. No definite cortical osteolysis along the osteotomy margins. First MTP and other joint spaces appear stable from last month. Calcified peripheral vascular disease. No new osseous abnormality. IMPRESSION: 1. Interval right great toe osteotomy about the IP joint which was resected. No superimposed plain radiographic evidence of osteomyelitis at this time. 2. Generalized soft tissue swelling. Calcified peripheral vascular disease. Electronically Signed   By: Genevie Ann M.D.   On: 03/20/2021 04:45     ____________________________________________   PROCEDURES  Procedure(s) performed:yes .1-3 Lead EKG Interpretation Performed by: Rudene Re,  MD Authorized by: Rudene Re, MD     Interpretation: non-specific     ECG rate assessment: normal     Rhythm: sinus rhythm     Ectopy: none     Conduction:  normal     Critical Care performed:  None ____________________________________________   INITIAL IMPRESSION / ASSESSMENT AND PLAN / ED COURSE  80 y.o. male with a history of poorly controlled diabetes type 2, aortic stenosis status post TVAR, sick sinus syndrome status post pacemaker, hypertension, anemia, PVD s/p angioplasty of SFA and R DP artery and anterior tibial artery on Plavix, R big toe infection with negative biopsy for osteomyelitis (02/18/21) who presents for evaluation of headedness, subjective fever, redness and warmth of the right big toe extending towards the dorsum of the foot.  Upon arrival to the ED patient was hypotensive with BP of 99/58 but afebrile with no tachycardia.  His right big toe was swollen with erythema and warmth concerning for cellulitis versus osteomyelitis.  X-ray negative for osteo-.  Lactic is normal, normal white count, no significant electrolyte derangements or signs of dehydration.  Will cover patient with IV Zosyn and cefepime and admit to the hospitalist service for wound care and further management.  Old medical records reviewed.  Patient placed on telemetry for close monitoring.   _________________________ 5:04 AM on 03/20/2021 -----------------------------------------  After 500 cc LR bolus patient's BP is now 120/53.  Will give a dose of IV fentanyl for pain.      _____________________________________________ Please note:  Patient was evaluated in Emergency Department today for the symptoms described in the history of present illness. Patient was evaluated in the context of the global COVID-19 pandemic, which necessitated consideration that the patient might be at risk for infection with the SARS-CoV-2 virus that causes COVID-19. Institutional protocols and algorithms that pertain  to the evaluation of patients at risk for COVID-19 are in a state of rapid change based on information released by regulatory bodies including the CDC and federal and state organizations. These policies and algorithms were followed during the patient's care in the ED.  Some ED evaluations and interventions may be delayed as a result of limited staffing during the pandemic.   [place] Controlled Substance Database was reviewed by me. ____________________________________________   FINAL CLINICAL IMPRESSION(S) / ED DIAGNOSES   Final diagnoses:  Cellulitis of toe of right foot  Hypotension, unspecified hypotension type      NEW MEDICATIONS STARTED DURING THIS VISIT:  ED Discharge Orders    None       Note:  This document was prepared using Dragon voice recognition software and may include unintentional dictation errors.    Alfred Levins, [place], MD 03/20/21 [phone]

## 2021-03-20 NOTE — Evaluation (Signed)
Occupational Therapy Evaluation Patient Details Name: Nathan Singleton MRN: [id number] DOB: 07-12-1941 Today's Date: 03/20/2021    History of Present Illness Pt. is a 80 y.o. male who was admitted to [facility] with dizziness, and right foot pain. Pt. had a recent hospiatlization for diabetic foot ulcer with infection. Pt. PMHx includes: PVD, HTN, DM, HLD, gout, DM, TAVR with bioprosthesis, pacemaker,  thrmobcytopenia, anemia, and chronic pain.   Clinical Impression   Pt. presents with weakness, 8/10 right toe pain, and limited functional mobility which hinder their ability to complete ADL, and  IADL tasks. Pt. Resides at home with his fiance. Pt. was independent with ADLs, and IADL functioning: including: meal preparation, and medication management. Pt. has not driven since his most recent hospitalization. Pt. BP 140/62, HR 70, SO2 99%. Pt. Present with an open, actively oozing wound on his right toe, and right toe pain which is limiting his ability to performm ADL tasks, and functional mobility. Pt. Could benefit from OT services for ADL training, A/E training, and pt. Education about home modification, and DME. Continue to assess discharge needs.     Follow Up Recommendations  Home health OT    Equipment Recommendations       Recommendations for Other Services       Precautions / Restrictions Precautions Precautions: ICD/Pacemaker Required Braces or Orthoses: Other Brace Other Brace: Boot for the right Restrictions Weight Bearing Restrictions: No      Mobility Bed Mobility Overal bed mobility: Needs Assistance             General bed mobility comments: Reposiitoning to the [facility]    Transfers                 General transfer comment: Deferred: Pt.'s right toe exposed, and actively oozing. (Nursing is aware.)    Balance                                           ADL either performed or assessed with clinical judgement   ADL Overall ADL's : Needs  assistance/impaired                                       General ADL Comments: MinA UE, MaxA LE ADLs     Vision Baseline Vision/History: Wears glasses Wears Glasses: At all times Patient Visual Report: No change from baseline       Perception     Praxis      Pertinent Vitals/Pain Pain Assessment: 0-10 Pain Score: 8  Pain Location: Right toe Pain Descriptors / Indicators: Aching Pain Intervention(s): Limited activity within patient's tolerance;Monitored during session     Hand Dominance Right   Extremity/Trunk Assessment Upper Extremity Assessment Upper Extremity Assessment: Overall WFL for tasks assessed           Communication Communication Communication: HOH (cochlear battery issues)   Cognition Arousal/Alertness: Awake/alert   Overall Cognitive Status: Within Functional Limits for tasks assessed                                     General Comments       Exercises     Shoulder Instructions      Home Living Family/patient expects to be  discharged to:: Private residence Living Arrangements: Spouse/significant other Available Help at Discharge: Available 24 hours/day Type of Home: House Home Access: Stairs to enter [employer] of Steps: 1 partial step   Home Layout: One level     Bathroom Shower/Tub: Teacher, early years/pre: [place]: None          Prior Functioning/Environment Level of Independence: Independent        Comments: Pt. has stopped driving since recent hospitalization.        OT Problem List: Decreased strength;Pain;Decreased knowledge of use of DME or AE;Decreased activity tolerance      OT Treatment/Interventions: Self-care/ADL training;Therapeutic exercise;Patient/family education;DME and/or AE instruction;Therapeutic activities    OT Goals(Current goals can be found in the care plan section) Acute Rehab OT Goals Patient Stated Goal: To  decrease pain OT Goal Formulation: With patient Potential to Achieve Goals: Good  OT Frequency: Min 1X/week   Barriers to D/C:            Co-evaluation              AM-PAC OT "6 Clicks" Daily Activity     Outcome Measure Help from another person eating meals?: None Help from another person taking care of personal grooming?: A Little Help from another person toileting, which includes using toliet, bedpan, or urinal?: A Little Help from another person bathing (including washing, rinsing, drying)?: A Lot Help from another person to put on and taking off regular upper body clothing?: A Little Help from another person to put on and taking off regular lower body clothing?: A Lot 6 Click Score: 17   End of Session    Activity Tolerance: Patient tolerated treatment well;Patient limited by pain Patient left: in bed;with call bell/phone within reach;with bed alarm set  OT Visit Diagnosis: Muscle weakness (generalized) (M62.81)                Time: [id number] OT Time Calculation (min): 20 min Charges:  OT General Charges $OT Visit: 1 Visit OT Evaluation $OT Eval Low Complexity: 1 Low  Harrel Carina, MS, OTR/L   Harrel Carina 03/20/2021, 9:53 AM

## 2021-03-20 NOTE — Plan of Care (Signed)

## 2021-03-20 NOTE — H&P (Signed)
History and Physical    Nathan Singleton KGM:[id number] DOB: 04/30/1941 DOA: 03/20/2021  Referring MD/NP/PA:   PCP: Ranae Plumber, [place]   Patient coming from:  The patient is coming from home.  At baseline, pt is independent for most of ADL.        Chief Complaint: dizziness and right foot pain  HPI: Nathan Singleton is a 80 y.o. male with medical history significant of PVD, HTN, HLD, DM, gout, s/p of TAVR with bioprosthesis, pacemaker placement, CAD, CKD-3, BPH, thrombocytopenia, anemia, chronic pain, who presents with dizziness and right foot pain.  Pt was recently hospitalized from 3/3 to 3/11 due to diabetic foot ulcer with infection and PVD. Pt had I&D with biopsy on 02/18/2021 biopsy, which did not show osteomyelitis. Pt was treated with IV vancomycin, Rocephin and Flagyl and discharged on Keflex for 10 days. Vascular surgery was consulted for PVD. Pt underwent lower extremity angiogram and angioplasty of the SFA and percutaneous transluminal angioplasty of the right dorsalis podalic artery and distal anterior tibial artery. He was started on aspirin and Plavix.  Pt states that he has worsening pain, swelling and redness in the right great toe in the past several days. Yesterday he had subjective fevers. Pt states that he woke up in the middle of the night to go to the bathroom and felt dizzy and lightheaded.  He felt like he was going to pass out, but did not. He took his blood pressure at home which was 92/55 and called 911.  Patient does not have facial droop or slurred speech.  Denies unilateral numbness or tingling in extremities.  Denies chest pain, cough, shortness of breath.  No nausea vomiting, diarrhea or abdominal pain.  No symptoms of UTI   ED Course: pt was found to have WBC 6.9, INR 1.2, lactic acid of 0.9, procalcitonin 0.61, negative Covid PCR, stable renal function, temperature normal, blood pressure 115/54 currently, heart rate 72, 60, RR 23, 19, oxygen saturation 99% on room  air.  Patient is admitted to [place] bed as inpatient.  Dr. Cleda Mccreedy of podiatry is consulted.  X-ray of right foot: 1. Interval right great toe osteotomy about the IP joint which was resected. No superimposed plain radiographic evidence of osteomyelitis at this time. 2. Generalized soft tissue swelling. Calcified peripheral vascular disease.  Review of Systems:   General: has subjective fevers, no chills, no body weight gain, fatigue HEENT: no blurry vision, sore throat. Has HOH Respiratory: no dyspnea, coughing, wheezing CV: no chest pain, no palpitations GI: no nausea, vomiting, abdominal pain, diarrhea, constipation GU: no dysuria, burning on urination, increased urinary frequency, hematuria  Ext: has tenderness, swelling, erythema in right great toe Neuro: no unilateral weakness, numbness, or tingling, no vision change or hearing loss.  Has dizziness Skin: no rash, no skin tear. MSK: No muscle spasm, no deformity, no limitation of range of movement in spin Heme: No easy bruising.  Travel history: No recent long distant travel.  Allergy:  Allergies  Allergen Reactions  . Atorvastatin Other (See Comments)    "bad for kidneys" Cramping   . Celecoxib Other (See Comments)    Other reaction(s): Other (See Comments) Kidney Problem Kidney Problem   . Dicyclomine Hcl Other (See Comments)    Stomach cramps  . Doxycycline Other (See Comments)    Per wife (per RN)- pt gets severe stomach pains even when takes with food and PCP said to not take    . Metformin Other (See Comments)  Past Medical History:  Diagnosis Date  . Aneurysm (arteriovenous) of coronary vessels   . Arthritis   . Ascending aortic aneurysm ([place])   . CAD (coronary artery disease)   . Diabetes mellitus without complication ([place])   . History of GI bleed   . Hyperlipidemia associated with type 2 diabetes mellitus ([place])   . Hypertension associated with diabetes ([place])   . Presence of permanent cardiac pacemaker    . PVD (peripheral vascular disease) ([place])   . S/P TAVR (transcatheter aortic valve replacement)     Past Surgical History:  Procedure Laterality Date  . Ankle repaired Right   . Carpal Tunnel repaired Left   . CATARACT EXTRACTION Bilateral   . COLONOSCOPY    . Copillar implant     . DG CHOLECYSTOGRAPHY GALL BLADDER ([place] [facility]X)    . GASTRIC BYPASS    . heart valve replaced    . IRRIGATION AND DEBRIDEMENT FOOT Right 02/18/2021   Procedure: IRRIGATION AND DEBRIDEMENT FOOT-Right Great Toe;  Surgeon: [name], DPM;  Location: [facility] ORS;  Service: Podiatry;  Laterality: Right;  . LOWER EXTREMITY ANGIOGRAPHY Right 02/17/2021   Procedure: Lower Extremity Angiography;  Surgeon: Algernon Huxley, MD;  Location: [facility];  Service: Cardiovascular;  Laterality: Right;  . PACEMAKER GENERATOR CHANGE    . REPLACEMENT TOTAL KNEE BILATERAL    . RTC    . STENT PLACE LEFT URETER ([facility] HX) Right    Right Leg  . Toe nail removed Bilateral     Social History:  reports that he has quit smoking. He has never used smokeless tobacco. He reports previous alcohol use. He reports previous drug use.  Family History:  Family History  Problem Relation Age of Onset  . Heart disease Mother   . Alzheimer's disease Mother   . Heart disease Father   . Diabetes Father      Prior to Admission medications   Medication Sig Start Date End Date Taking? Authorizing Provider  allopurinol (ZYLOPRIM) 100 MG tablet Take 100 mg by mouth daily. 10/19/20   [provider]  amLODipine (NORVASC) 10 MG tablet Take 10 mg by mouth daily.    [provider]  baclofen (LIORESAL) 10 MG tablet Take 10 mg by mouth 2 (two) times daily. 02/12/21   [provider]  BAYER ASPIRIN EC LOW DOSE 81 MG EC tablet Take 81 mg by mouth daily. 11/11/20   [provider]  calcium citrate-vitamin D (CITRACAL+D) 315-200 MG-UNIT tablet Take 1 tablet by mouth daily. 11/11/20   [provider]   cetirizine (ZYRTEC) 10 MG tablet Take 10 mg by mouth daily. 11/11/20   [provider]  clopidogrel (PLAVIX) 75 MG tablet Take 1 tablet (75 mg total) by mouth daily. 02/22/21   Charlynne Cousins, MD  D-5000 125 MCG (5000 UT) TABS Take 1 tablet by mouth daily. 11/11/20   [provider]  diphenoxylate-atropine (LOMOTIL) 2.5-0.025 MG tablet Take 1 tablet by mouth every 6 (six) hours as needed for diarrhea or loose stools. 01/01/21   [provider]  docusate sodium (COLACE) 100 MG capsule Take 200 mg by mouth 2 (two) times daily. 11/11/20   [provider]  furosemide (LASIX) 20 MG tablet Take 20 mg by mouth daily. 10/19/20   [provider]  gabapentin (NEURONTIN) 100 MG capsule Take 100 mg by mouth daily. 01/29/21   [provider]  HYDROcodone-acetaminophen (NORCO) 7.5-325 MG tablet Take 1-2 tablets by mouth every 6 (  six) hours as needed for moderate pain or severe pain. 02/22/21   Charlynne Cousins, MD  insulin glargine (LANTUS) 100 UNIT/ML Solostar Pen Inject 25 Units into the skin at bedtime.    [provider]  isosorbide mononitrate (IMDUR) 30 MG 24 hr tablet Take 30 mg by mouth daily. 02/12/21   [provider]  LINZESS 145 MCG CAPS capsule Take 145 mcg by mouth daily. 11/11/20   [provider]  liraglutide (VICTOZA) 18 MG/3ML SOPN Inject 1.8 mg into the skin daily.    [provider]  losartan (COZAAR) 25 MG tablet Take 1 tablet (25 mg total) by mouth daily. 02/24/21   Charlynne Cousins, MD  multivitamin (ONE-A-DAY MEN'S) TABS tablet Take 1 tablet by mouth daily. 11/11/20   [provider]  pantoprazole (PROTONIX) 40 MG tablet Take 1 tablet (40 mg total) by mouth daily. 02/11/21   [name], MD  rosuvastatin (CRESTOR) 40 MG tablet Take 40 mg by mouth daily. 02/13/21   [provider]  sertraline (ZOLOFT) 100 MG tablet Take 200 mg by mouth daily. 11/11/20   [provider]   tamsulosin (FLOMAX) 0.4 MG CAPS capsule Take 0.4 mg by mouth daily. 11/11/20   [provider]    Physical Exam: Vitals:   03/20/21 0334 03/20/21 0430 03/20/21 0500 03/20/21 0700  BP: (!) 99/58 (!) 120/53 (!) 115/54 (!) 110/56  Pulse: 63 64 60 (!) 59  Resp: 18 (!) 23 19 (!) 22  Temp: 98.3 F (36.8 C)     SpO2: 99% 99% 99% 99%  Weight:      Height:       General: Not in acute distress HEENT:       Eyes: PERRL, EOMI, no scleral icterus.       ENT: No discharge from the ears and nose, no pharynx injection, no tonsillar enlargement.        Neck: No JVD, no bruit, no mass felt. Heme: No neck lymph node enlargement. Cardiac: S1/S2, RRR, No gallops or rubs. Respiratory: No rales, wheezing, rhonchi or rubs. GI: Soft, nondistended, nontender, no rebound pain, no organomegaly, BS present. GU: No hematuria Ext: has tenderness, swelling, erythema, warmth in right great toe, extending to the dorsal aspects of foot. Patient has palpable DP and PT pulses.      Musculoskeletal: No joint deformities, No joint redness or warmth, no limitation of ROM in spin. Skin: No rashes.  Neuro: Alert, oriented X3, cranial nerves II-XII grossly intact, moves all extremities normally.  Psych: Patient is not psychotic, no suicidal or hemocidal ideation.  Labs on Admission: I have personally reviewed following labs and imaging studies  CBC: Recent Labs  Lab 03/20/21 0346  WBC 6.9  NEUTROABS 5.5  HGB 9.6*  HCT 28.0*  MCV 91.5  PLT 95*   Basic Metabolic Panel: Recent Labs  Lab 03/20/21 0346  NA 136  K 4.4  CL 110  CO2 18*  GLUCOSE 172*  BUN 48*  CREATININE 1.45*  CALCIUM 8.7*   GFR: Estimated Creatinine Clearance: 52.6 mL/min (A) (by C-G formula based on SCr of 1.45 mg/dL (H)). Liver Function Tests: Recent Labs  Lab 03/20/21 0346  AST 37  ALT 38  ALKPHOS 95  BILITOT 0.6  PROT 7.0  ALBUMIN 3.8   No results for input(s): LIPASE, AMYLASE in the last 168 hours. No  results for input(s): AMMONIA in the last 168 hours. Coagulation Profile: Recent Labs  Lab 03/20/21 0346  INR 1.2  Cardiac Enzymes: No results for input(s): CKTOTAL, CKMB, CKMBINDEX, TROPONINI in the last 168 hours. BNP (last 3 results) No results for input(s): PROBNP in the last 8760 hours. HbA1C: No results for input(s): HGBA1C in the last 72 hours. CBG: Recent Labs  Lab 03/20/21 0746  GLUCAP 253*   Lipid Profile: No results for input(s): CHOL, HDL, LDLCALC, TRIG, CHOLHDL, LDLDIRECT in the last 72 hours. Thyroid Function Tests: No results for input(s): TSH, T4TOTAL, FREET4, T3FREE, THYROIDAB in the last 72 hours. Anemia Panel: No results for input(s): VITAMINB12, FOLATE, FERRITIN, TIBC, IRON, RETICCTPCT in the last 72 hours. Urine analysis:    Component Value Date/Time   COLORURINE STRAW (A) 02/13/2021 1507   APPEARANCEUR CLEAR (A) 02/13/2021 1507   LABSPEC 1.011 02/13/2021 1507   PHURINE 5.0 02/13/2021 1507   GLUCOSEU NEGATIVE 02/13/2021 1507   HGBUR NEGATIVE 02/13/2021 1507   BILIRUBINUR NEGATIVE 02/13/2021 1507   KETONESUR NEGATIVE 02/13/2021 1507   PROTEINUR NEGATIVE 02/13/2021 1507   NITRITE NEGATIVE 02/13/2021 1507   LEUKOCYTESUR NEGATIVE 02/13/2021 1507   Sepsis Labs: _0 (procalcitonin:4,lacticidven:4) ) Recent Results (from the past 240 hour(s))  Blood culture (routine x 2)     Status: None (Preliminary result)   Collection Time: 03/20/21  3:52 AM   Specimen: BLOOD  Result Value Ref Range Status   Specimen Description BLOOD BLOOD LEFT FOREARM  Final   Special Requests   Final    BOTTLES DRAWN AEROBIC AND ANAEROBIC Blood Culture adequate volume   Culture   Final    NO GROWTH <12 HOURS Performed at [facility], [address], [place], [place] [address]    Report Status PENDING  Incomplete  Resp Panel by RT-PCR (Flu A&B, Covid) Nasopharyngeal Swab     Status: None   Collection Time: 03/20/21  4:01 AM   Specimen: Nasopharyngeal Swab;  Nasopharyngeal(NP) swabs in vial transport medium  Result Value Ref Range Status   SARS Coronavirus 2 by RT PCR NEGATIVE NEGATIVE Final    Comment: (NOTE) SARS-CoV-2 target nucleic acids are NOT DETECTED.  The SARS-CoV-2 RNA is generally detectable in upper respiratory specimens during the acute phase of infection. The lowest concentration of SARS-CoV-2 viral copies this assay can detect is 138 copies/mL. A negative result does not preclude SARS-Cov-2 infection and should not be used as the sole basis for treatment or other patient management decisions. A negative result may occur with  improper specimen collection/handling, submission of specimen other than nasopharyngeal swab, presence of viral mutation(s) within the areas targeted by this assay, and inadequate number of viral copies(<138 copies/mL). A negative result must be combined with clinical observations, patient history, and epidemiological information. The expected result is Negative.  Fact Sheet for Patients:  EntrepreneurPulse.com.au  Fact Sheet for Healthcare Providers:  IncredibleEmployment.be  This test is no t yet approved or cleared by the [place] FDA and  has been authorized for detection and/or diagnosis of SARS-CoV-2 by FDA under an Emergency Use Authorization (EUA). This EUA will remain  in effect (meaning this test can be used) for the duration of the COVID-19 declaration under Section 564(b)(1) of the Act, 21 U.S.C.section 360bbb-3(b)(1), unless the authorization is terminated  or revoked sooner.       Influenza A by PCR NEGATIVE NEGATIVE Final   Influenza B by PCR NEGATIVE NEGATIVE Final    Comment: (NOTE) The Xpert Xpress SARS-CoV-2/FLU/RSV plus assay is intended as an aid in the diagnosis of influenza from Nasopharyngeal swab specimens and should not be used as  a sole basis for treatment. Nasal washings and aspirates are unacceptable for Xpert Xpress  SARS-CoV-2/FLU/RSV testing.  Fact Sheet for Patients: EntrepreneurPulse.com.au  Fact Sheet for Healthcare Providers: IncredibleEmployment.be  This test is not yet approved or cleared by the [place] FDA and has been authorized for detection and/or diagnosis of SARS-CoV-2 by FDA under an Emergency Use Authorization (EUA). This EUA will remain in effect (meaning this test can be used) for the duration of the COVID-19 declaration under Section 564(b)(1) of the Act, 21 U.S.C. section 360bbb-3(b)(1), unless the authorization is terminated or revoked.  Performed at [facility], [address] [place], [place] [address]      Radiological Exams on Admission: DG Foot Complete Right  Result Date: 03/20/2021 CLINICAL DATA:  80 year old male status post right foot surgery for infection a few days ago. Hypotension, dizziness. EXAM: RIGHT FOOT COMPLETE - 3+ VIEW COMPARISON:  Right foot series 02/13/2021. FINDINGS: Right great toe osteotomy about the IP joint which has been resected. Generalized soft tissue swelling in the right great toe and medial foot. No soft tissue gas. No definite cortical osteolysis along the osteotomy margins. First MTP and other joint spaces appear stable from last month. Calcified peripheral vascular disease. No new osseous abnormality. IMPRESSION: 1. Interval right great toe osteotomy about the IP joint which was resected. No superimposed plain radiographic evidence of osteomyelitis at this time. 2. Generalized soft tissue swelling. Calcified peripheral vascular disease. Electronically Signed   By: Genevie Ann M.D.   On: 03/20/2021 04:45     EKG: I have personally reviewed.  Sinus rhythm, QTC 406, right bundle blockade, early R wave progression  Assessment/Plan Principal Problem:   Cellulitis of great toe, right Active Problems:   Hypertension   PVD (peripheral vascular disease) (HCC)   CAD (coronary artery disease)    Thrombocytopenia (HCC)   Normocytic anemia   COPD (chronic obstructive pulmonary disease) (HCC)   Depression   GERD without esophagitis   Gout   Hyperlipidemia   Type II diabetes mellitus with renal manifestations (HCC)   CKD (chronic kidney disease), stage IIIa   Dizziness   Cellulitis of great toe, right: Temperature normal, no leukocytosis.  Does not meets criteria for sepsis.  Lactic acid is normal.  Procalcitonin 0.61.  Message sent to Dr. Cleda Mccreedy of podiatry for consultation  - Admitted to [place] bed as inpatient - Empiric antimicrobial treatment with vancomycin, Flagyl, Rocephin (patient received 1 dose of Zosyn in ED) - Blood cultures x 2  - ESR and CRP - IVF: 500 cc of LR x 2 in ED, followed by 75 cc/h  Hypertension -Hold all blood pressure medications since blood pressures are soft  PVD (peripheral vascular disease) (HCC) -Continue aspirin, Plavix, Crestor   CAD (coronary artery disease): No CP --Continue aspirin, Plavix, Crestor, Imdur  Thrombocytopenia ([place]): this is a chronic issue.  Platelet of 95.  No active bleeding -f/u by CBC  Normocytic anemia: Hemoglobin stable.  9.1 on 02/16/2021 --> 9.6 -Follow-up with CBC  COPD (chronic obstructive pulmonary disease) ([place]): Stable -As needed albuterol  Depression -Zoloft  GERD without esophagitis -Protonix  Gout -Allopurinol  Hyperlipidemia -Crestor  Type II diabetes mellitus with renal manifestations [facility]): Recent A1c 7.8, poorly controlled.  Patient taking Victoza and Lantus -Decrease Lantus dose from 25 to 15 unit daily -Sliding scale insulin  CKD (chronic kidney disease), stage IIIa: Stable -Follow-up with BMP  Dizziness: Possibly due to dehydration and low blood pressure.  No focal neuro deficit on  physical examination. -IV fluid as above -Check orthostatic vital sign -PT/OT     DVT ppx: SQ Heparin        Code Status: Full code per pt and his fiance Family Communication: Yes, patient's  fianc at bed side Disposition Plan:  Anticipate discharge back to previous environment Consults called:  Dr. Cleda Mccreedy of podiatry Admission status and Level of care: Med-Surg:    as inpt          Status is: Inpatient  Remains inpatient appropriate because:Inpatient level of care appropriate due to severity of illness   Dispo: The patient is from: Home              Anticipated d/c is to: Home              Patient currently is not medically stable to d/c.   Difficult to place patient No          Date of Service 03/20/2021    [facility]   If 7PM-7AM, please contact night-coverage [email or web address] 03/20/2021, 8:05 AM

## 2021-03-20 NOTE — Progress Notes (Signed)
PT Cancellation Note  Patient Details Name: Jacobson Milan MRN: [id number] DOB: January 03, 1941   Cancelled Treatment:    Reason Eval/Treat Not Completed: Other (comment): Per chart review pt scheduled for R great toe amputation this date at 20:00.  Will hold PT evaluation pending completion of surgical procedure and receipt of new or continue at transfer PT orders.   [name] PT, DPT 03/20/21, 2:02 PM

## 2021-03-20 NOTE — Op Note (Signed)
Date of operation: 03/20/2021.  Surgeon: Durward Fortes D.P.M.  Preoperative diagnosis: Osteomyelitis right great toe.  Postoperative diagnosis: Same.  Procedure: Amputation right great toe at the metatarsal phalangeal joint.  Anesthesia: Local MAC.  Hemostasis: Pneumatic tourniquet right ankle 250 mmHg.  Estimated blood loss: 25 cc.  Pathology: Right great toe.  Cultures: Bone cultures proximal phalanx right hallux.  Complications: None apparent.  Operative indications: This is a 80 year old male with a history of ulceration on his right great toe with previous surgery for removal of the head of the proximal phalanx about a month ago.  Recently developed infection in the great toe and was admitted for IV antibiotics and definitive treatment.  Operative procedure: Patient was taken to the operating room and placed on the table in supine position.  Following satisfactory sedation the right foot was anesthetized with 10 cc of 0.5% bupivacaine plain around the first metatarsal.  A pneumatic tourniquet was applied around the right ankle and the foot was prepped and draped in the usual sterile fashion.  The foot was exsanguinated and the tourniquet inflated to 250 mmHg. Attention was then directed to the distal aspect of the right foot where an elliptical incision was made dorsal to plantar around the medial and lateral base of the proximal phalanx.  Incision was carried sharply down to the level of the bone and dissection carried back proximally to the metatarsal phalangeal joint where the toe was disarticulated and removed in toto.  Good healthy bleeding tissues were noted.  The wound was flushed with copious amounts of sterile saline and closed using 3-0 nylon simple interrupted sutures with 1 vertical mattress suture.  A portion of the proximal phalanx was taken from the specimen to be sent for bone culture.  Xeroform applied to the incision area followed by 4 x 4's ABDs and Kerlix.  Tourniquet  was released and a second Kerlix and Ace wrap applied to the right lower extremity.  Patient was taken to the PACU with vital signs stable and in good condition having tolerated the procedure and anesthesia well.

## 2021-03-20 NOTE — Plan of Care (Signed)
  Problem: Education: Goal: Knowledge of General Education information will improve Description: Including pain rating scale, medication(s)/side effects and non-pharmacologic comfort measures Outcome: Progressing   Problem: Health Behavior/Discharge Planning: Goal: Ability to manage health-related needs will improve Outcome: Progressing   Problem: Clinical Measurements: Goal: Ability to maintain clinical measurements within normal limits will improve Outcome: Progressing Goal: Will remain free from infection Outcome: Progressing Goal: Diagnostic test results will improve Outcome: Progressing Goal: Respiratory complications will improve Outcome: Progressing Goal: Cardiovascular complication will be avoided Outcome: Progressing   Problem: Activity: Goal: Risk for activity intolerance will decrease Outcome: Progressing   Problem: Nutrition: Goal: Adequate nutrition will be maintained Outcome: Progressing   Problem: Coping: Goal: Level of anxiety will decrease Outcome: Progressing   Problem: Elimination: Goal: Will not experience complications related to bowel motility Outcome: Progressing Goal: Will not experience complications related to urinary retention Outcome: Progressing   Problem: Pain Managment: Goal: General experience of comfort will improve Outcome: Progressing   Problem: Safety: Goal: Ability to remain free from injury will improve Outcome: Progressing   Problem: Fluid Volume: Goal: Hemodynamic stability will improve Outcome: Progressing   Problem: Clinical Measurements: Goal: Diagnostic test results will improve Outcome: Progressing Goal: Signs and symptoms of infection will decrease Outcome: Progressing   Problem: Respiratory: Goal: Ability to maintain adequate ventilation will improve Outcome: Progressing   

## 2021-03-20 NOTE — Progress Notes (Signed)
PHARMACY - PHYSICIAN COMMUNICATION CRITICAL VALUE ALERT - BLOOD CULTURE IDENTIFICATION (BCID)  Nathan Singleton is an 80 y.o. male who presented to [facility] on 03/20/2021 with a chief complaint of right great toe OM  Assessment:  1/4 bottles GPC so far. BCID detected Staphylococcus aureus. No resistance genes detected. Suspect skin source.   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 Pathology is pending. Patient is afebrile, no leukocytosis. Recommend considering cefazolin monotherapy.   Results for orders placed or performed during the hospital encounter of 03/20/21  Blood Culture ID Panel (Reflexed) (Collected: 03/20/2021  3:52 AM)  Result Value Ref Range   Enterococcus faecalis NOT DETECTED NOT DETECTED   Enterococcus Faecium NOT DETECTED NOT DETECTED   Listeria monocytogenes NOT DETECTED NOT DETECTED   Staphylococcus species DETECTED (A) NOT DETECTED   Staphylococcus aureus (BCID) DETECTED (A) NOT DETECTED   Staphylococcus epidermidis NOT DETECTED NOT DETECTED   Staphylococcus lugdunensis NOT DETECTED NOT DETECTED   Streptococcus species NOT DETECTED NOT DETECTED   Streptococcus agalactiae NOT DETECTED NOT DETECTED   Streptococcus pneumoniae NOT DETECTED NOT DETECTED   Streptococcus pyogenes NOT DETECTED NOT DETECTED   A.calcoaceticus-baumannii NOT DETECTED NOT DETECTED   Bacteroides fragilis NOT DETECTED NOT DETECTED   Enterobacterales NOT DETECTED NOT DETECTED   Enterobacter cloacae complex NOT DETECTED NOT DETECTED   Escherichia coli NOT DETECTED NOT DETECTED   Klebsiella aerogenes NOT DETECTED NOT DETECTED   Klebsiella oxytoca NOT DETECTED NOT DETECTED   Klebsiella pneumoniae NOT DETECTED NOT DETECTED   Proteus species NOT DETECTED NOT DETECTED   Salmonella species NOT DETECTED NOT DETECTED    Serratia marcescens NOT DETECTED NOT DETECTED   Haemophilus influenzae NOT DETECTED NOT DETECTED   Neisseria meningitidis NOT DETECTED NOT DETECTED   Pseudomonas aeruginosa NOT DETECTED NOT DETECTED   Stenotrophomonas maltophilia NOT DETECTED NOT DETECTED   Candida albicans NOT DETECTED NOT DETECTED   Candida auris NOT DETECTED NOT DETECTED   Candida glabrata NOT DETECTED NOT DETECTED   Candida krusei NOT DETECTED NOT DETECTED   Candida parapsilosis NOT DETECTED NOT DETECTED   Candida tropicalis NOT DETECTED NOT DETECTED   Cryptococcus neoformans/gattii NOT DETECTED NOT DETECTED   Meth resistant mecA/C and MREJ NOT DETECTED NOT DETECTED    Benita Gutter 03/20/2021  11:11 PM

## 2021-03-20 NOTE — Interval H&P Note (Signed)
History and Physical Interval Note:  03/20/2021 7:56 PM  Nathan Singleton  has presented today for surgery, with the diagnosis of Infection Right Great Toe.  The various methods of treatment have been discussed with the patient and family. After consideration of risks, benefits and other options for treatment, the patient has consented to  Procedure(s): AMPUTATION TOE (Right) as a surgical intervention.  The patient's history has been reviewed, patient examined, no change in status, stable for surgery.  I have reviewed the patient's chart and labs.  Questions were answered to the patient's satisfaction.     Nathan Singleton

## 2021-03-21 ENCOUNTER — Ambulatory Visit: Admission: RE | Admit: 2021-03-21 | Payer: 59 | Source: Ambulatory Visit

## 2021-03-21 ENCOUNTER — Encounter: Payer: Self-pay | Admitting: Podiatry

## 2021-03-21 DIAGNOSIS — L03031 Cellulitis of right toe: Secondary | ICD-10-CM | POA: Diagnosis not present

## 2021-03-21 DIAGNOSIS — J449 Chronic obstructive pulmonary disease, unspecified: Secondary | ICD-10-CM | POA: Diagnosis not present

## 2021-03-21 DIAGNOSIS — R42 Dizziness and giddiness: Secondary | ICD-10-CM | POA: Diagnosis not present

## 2021-03-21 DIAGNOSIS — I739 Peripheral vascular disease, unspecified: Secondary | ICD-10-CM | POA: Diagnosis not present

## 2021-03-21 LAB — GLUCOSE, CAPILLARY
Glucose-Capillary: 218 mg/dL — ABNORMAL HIGH (ref 70–99)
Glucose-Capillary: 299 mg/dL — ABNORMAL HIGH (ref 70–99)
Glucose-Capillary: 229 mg/dL — ABNORMAL HIGH (ref 70–99)
Glucose-Capillary: 203 mg/dL — ABNORMAL HIGH (ref 70–99)

## 2021-03-21 LAB — CBC
HCT: 27 % — ABNORMAL LOW (ref 39.0–52.0)
Hemoglobin: 9 g/dL — ABNORMAL LOW (ref 13.0–17.0)
MCH: 30.4 pg (ref 26.0–34.0)
MCHC: 33.3 g/dL (ref 30.0–36.0)
MCV: 91.2 fL (ref 80.0–100.0)
Platelets: 84 10*3/uL — ABNORMAL LOW (ref 150–400)
RBC: 2.96 MIL/uL — ABNORMAL LOW (ref 4.22–5.81)
RDW: 13.8 % (ref 11.5–15.5)
WBC: 6.4 10*3/uL (ref 4.0–10.5)
nRBC: 0 % (ref 0.0–0.2)

## 2021-03-21 LAB — BASIC METABOLIC PANEL
Anion gap: 7 (ref 5–15)
BUN: 38 mg/dL — ABNORMAL HIGH (ref 8–23)
CO2: 19 mmol/L — ABNORMAL LOW (ref 22–32)
Calcium: 8.5 mg/dL — ABNORMAL LOW (ref 8.9–10.3)
Chloride: 110 mmol/L (ref 98–111)
Creatinine, Ser: 1.45 mg/dL — ABNORMAL HIGH (ref 0.61–1.24)
GFR, Estimated: 49 mL/min — ABNORMAL LOW (ref >60)
Glucose, Bld: 252 mg/dL — ABNORMAL HIGH (ref 70–99)
Potassium: 4 mmol/L (ref 3.5–5.1)
Sodium: 136 mmol/L (ref 135–145)

## 2021-03-21 LAB — PROCALCITONIN: Procalcitonin: 0.58 ng/mL

## 2021-03-21 MED ORDER — CEFAZOLIN SODIUM-DEXTROSE 2-4 GM/100ML-% IV SOLN
2.0000 g | Freq: Three times a day (TID) | INTRAVENOUS | Status: DC
  Administered 2021-03-21 – 2021-03-28 (×19): 2 g via INTRAVENOUS
  Filled 2021-03-21 (×26): qty 100

## 2021-03-21 MED ORDER — INSULIN ASPART 100 UNIT/ML ~~LOC~~ SOLN
3.0000 [IU] | Freq: Three times a day (TID) | SUBCUTANEOUS | Status: DC
  Administered 2021-03-22 (×2): 3 [IU] via SUBCUTANEOUS
  Filled 2021-03-21 (×2): qty 1

## 2021-03-21 MED ORDER — SODIUM CHLORIDE 0.9 % IV SOLN
INTRAVENOUS | Status: DC | PRN
  Administered 2021-03-21: 1000 mL via INTRAVENOUS
  Administered 2021-03-21 – 2021-03-26 (×3): 250 mL via INTRAVENOUS

## 2021-03-21 MED ORDER — INSULIN GLARGINE 100 UNIT/ML ~~LOC~~ SOLN
20.0000 [IU] | Freq: Every day | SUBCUTANEOUS | Status: DC
  Administered 2021-03-21: 20 [IU] via SUBCUTANEOUS
  Filled 2021-03-21 (×2): qty 0.2

## 2021-03-21 NOTE — Hospital Course (Addendum)
Nathan Singleton is a 80 y.o. male with medical history significant of PVD, HTN, HLD, DM, gout, s/p of TAVR with bioprosthesis, pacemaker placement, CAD, CKD-3a, BPH, thrombocytopenia, anemia, chronic pain, who presented to the ED on 03/20/21 with dizziness and worsening right foot pain in addition to swelling and progressive erythema.  Due to dizziness, pt checked BP which was 92/55.  EMS was called.   Pt hospitalized 3/3 to 3/11 due to infected diabetic foot ulcer and PVD. Underwent I&D with biopsy on 02/18/2021.  Biopsy did not show osteomyelitis. Treated with IV Vancomycin, Rocephin and Flagyl and discharged on Keflex for 10 days. Vascular surgery was consulted for PVD. Pt underwent lower extremity angiogram and angioplasty of the SFA and percutaneous transluminal angioplasty of the right dorsalis podalic artery and distal anterior tibial artery. Started on aspirin and Plavix.  Xray of Right Foot in the ED showed:  1. Interval right great toe osteotomy about the IP joint which was resected. No superimposed plain radiographic evidence of osteomyelitis at this time. 2. Generalized soft tissue swelling. Calcified peripheral vascular disease.   Admitted to hospitalist service.  Dr. Cleda Mccreedy of podiatry consulted.  Patient underwent amputation of the Right great toe on evening of 03/20/21.  On IV antibiotics with surgical culture / path pending. Infectious disease is following.

## 2021-03-21 NOTE — Consult Note (Signed)
Infectious Disease     Reason for Consult: MSSA bacteremia   Referring Physician: Dr Arbutus Ped Date of Admission:  03/20/2021   Principal Problem:   Cellulitis of great toe, right Active Problems:   Hypertension   PVD (peripheral vascular disease) (HCC)   CAD (coronary artery disease)   Thrombocytopenia (HCC)   Normocytic anemia   COPD (chronic obstructive pulmonary disease) (HCC)   Depression   GERD without esophagitis   Gout   Hyperlipidemia   Type II diabetes mellitus with renal manifestations ([place])   CKD (chronic kidney disease), stage IIIa   Dizziness   HPI: Nathan Singleton is a 80 y.o. male with hx of PVD, HTN, HLD, DM, gout, s/p of TAVR with bioprosthesis, pacemaker placement, CAD, CKD-3, BPH, thrombocytopenia, anemia, chronic pain, who presents with dizziness and right foot pain.  Pt was recently hospitalized from 3/3 to 3/11 due to diabetic foot ulcer with infection and PVD. Pt had I&D with biopsy on 02/18/2021 biopsy, which showed negative margins for  Osteomyelitis. Bone cx neg.  Pt was treated with IV vancomycin, Rocephin and Flagyl and discharged on Keflexfor 10 days. Vascular surgery was consulted for PVD. Pt underwent lower extremity angiogram and angioplasty of the SFA and percutaneous transluminal angioplasty of the right dorsalis podalic artery and distal anterior tibial artery. He was started on aspirin and Plavix. Admitted now with worsening pain, swelling and redness in the right great toe in the past several days. He also  had subjective fevers.  He felt like he was going to pass out, but did not. He took his blood pressure at home which was 92/55., On admit wbc 6.    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  Bcx + MSSA.   Past Medical History:  Diagnosis Date  . Aneurysm (arteriovenous) of coronary vessels   . Arthritis   . Ascending aortic aneurysm ([place])   . CAD (coronary artery disease)   . Diabetes mellitus without complication ([place])   . History of GI bleed    . Hyperlipidemia associated with type 2 diabetes mellitus ([place])   . Hypertension associated with diabetes ([place])   . Presence of permanent cardiac pacemaker   . PVD (peripheral vascular disease) ([place])   . S/P TAVR (transcatheter aortic valve replacement)    Past Surgical History:  Procedure Laterality Date  . Ankle repaired Right   . Carpal Tunnel repaired Left   . CATARACT EXTRACTION Bilateral   . COLONOSCOPY    . Copillar implant     . DG CHOLECYSTOGRAPHY GALL BLADDER ([facility])    . GASTRIC BYPASS    . heart valve replaced    . IRRIGATION AND DEBRIDEMENT FOOT Right 02/18/2021   Procedure: IRRIGATION AND DEBRIDEMENT FOOT-Right Great Toe;  Surgeon: [name], DPM;  Location: [facility];  Service: Podiatry;  Laterality: Right;  . LOWER EXTREMITY ANGIOGRAPHY Right 02/17/2021   Procedure: Lower Extremity Angiography;  Surgeon: Algernon Huxley, MD;  Location: [facility];  Service: Cardiovascular;  Laterality: Right;  . PACEMAKER GENERATOR CHANGE    . REPLACEMENT TOTAL KNEE BILATERAL    . RTC    . STENT PLACE LEFT URETER ([facility] HX) Right    Right Leg  . Toe nail removed Bilateral    Social History   Tobacco Use  . Smoking status: Former Research scientist (life sciences)  . Smokeless tobacco: Never Used  Substance Use Topics  . Alcohol use: Not Currently  . Drug use: Not Currently   Family History  Problem Relation Age of Onset  . Heart disease Mother   . Alzheimer's disease Mother   . Heart disease Father   . Diabetes Father     Allergies:  Allergies  Allergen Reactions  . Atorvastatin Other (See Comments)    "bad for kidneys" Cramping   . Celecoxib Other (See Comments)    Other reaction(s): Other (See Comments) Kidney Problem Kidney Problem   . Dicyclomine Hcl Other (See Comments)    Stomach cramps  . Doxycycline Other (See Comments)    Per wife (per RN)- pt gets severe stomach pains even when takes with food and PCP said to not take    . Metformin Other (See Comments)     Current antibiotics: Antibiotics Given (last 72 hours)    Date/Time Action Medication Dose Rate   03/20/21 0401 New Bag/Given   piperacillin-tazobactam (ZOSYN) IVPB 3.375 g 3.375 g 100 mL/hr   03/20/21 0449 New Bag/Given   vancomycin (VANCOCIN) IVPB 1000 mg/200 mL premix 1,000 mg 200 mL/hr   03/20/21 0743 New Bag/Given   cefTRIAXone (ROCEPHIN) 2 g in sodium chloride 0.9 % 100 mL IVPB 2 g 200 mL/hr   03/20/21 0829 New Bag/Given   vancomycin (VANCOCIN) IVPB 1000 mg/200 mL premix 1,000 mg 200 mL/hr   03/20/21 0829 New Bag/Given   metroNIDAZOLE (FLAGYL) IVPB 500 mg 500 mg 100 mL/hr   03/20/21 1734 New Bag/Given   metroNIDAZOLE (FLAGYL) IVPB 500 mg 500 mg 100 mL/hr   03/21/21 0016 New Bag/Given   metroNIDAZOLE (FLAGYL) IVPB 500 mg 500 mg 100 mL/hr   03/21/21 0746 New Bag/Given   cefTRIAXone (ROCEPHIN) 2 g in sodium chloride 0.9 % 100 mL IVPB 2 g 200 mL/hr   03/21/21 0847 New Bag/Given   metroNIDAZOLE (FLAGYL) IVPB 500 mg 500 mg 100 mL/hr   03/21/21 1001 New Bag/Given   vancomycin (VANCOREADY) IVPB 1000 mg/200 mL 1,000 mg 200 mL/hr      MEDICATIONS: . allopurinol  100 mg Oral Daily  . aspirin EC  81 mg Oral Daily  . baclofen  10 mg Oral BID  . cholecalciferol  5,000 Units Oral Daily  . clopidogrel  75 mg Oral Daily  . docusate sodium  200 mg Oral BID  . gabapentin  100 mg Oral Daily  . insulin aspart  0-5 Units Subcutaneous QHS  . insulin aspart  0-9 Units Subcutaneous TID WC  . insulin glargine  15 Units Subcutaneous QHS  . isosorbide mononitrate  30 mg Oral Daily  . linaclotide  145 mcg Oral Daily  . loratadine  10 mg Oral Daily  . multivitamin with minerals  1 tablet Oral Daily  . pantoprazole  40 mg Oral Daily  . rosuvastatin  40 mg Oral Daily  . sertraline  200 mg Oral Daily  . tamsulosin  0.4 mg Oral Daily    Review of Systems - 11 systems reviewed and negative per HPI   OBJECTIVE: Temp:  [97.8 F (36.6 C)-99.2 F (37.3 C)] 99.2 F (37.3 C) (04/08  0742) Pulse Rate:  [59-88] 76 (04/08 0742) Resp:  [14-63] 18 (04/08 0742) BP: (123-147)/(51-110) 126/57 (04/08 0742) SpO2:  [97 %-100 %] 98 % (04/08 0742) Physical Exam  Constitutional:  oriented to person, place, and time. appears well-developed and well-nourished. No distress. HOH HENT: [place]/AT, PERRLA, no scleral icterus Mouth/Throat: Oropharynx is clear and moist. No oropharyngeal exudate.  Cardiovascular: Normal rate, regular rhythm and normal heart sounds. Exam reveals no gallop and no friction rub.  No murmur heard.  Pulmonary/Chest: Effort normal and breath sounds normal. No respiratory distress.  has no wheezes.  Neck = supple, no nuchal rigidity Abdominal: Soft. Bowel sounds are normal.  exhibits no distension. There is no tenderness.  Lymphadenopathy: no cervical adenopathy. No axillary adenopathy Neurological: alert and oriented to person, place, and time.  Skin:foot wrapped post op Psychiatric: a normal mood and affect.  behavior is normal.    LABS: Results for orders placed or performed during the hospital encounter of 03/20/21 (from the past 48 hour(s))  CBC with Differential     Status: Abnormal   Collection Time: 03/20/21  3:46 AM  Result Value Ref Range   WBC 6.9 4.0 - 10.5 K/uL   RBC 3.06 (L) 4.22 - 5.81 MIL/uL   Hemoglobin 9.6 (L) 13.0 - 17.0 g/dL   HCT 28.0 (L) 39.0 - 52.0 %   MCV 91.5 80.0 - 100.0 fL   MCH 31.4 26.0 - 34.0 pg   MCHC 34.3 30.0 - 36.0 g/dL   RDW 13.8 11.5 - 15.5 %   Platelets 95 (L) 150 - 400 K/uL    Comment: Immature Platelet Fraction may be clinically indicated, consider ordering this additional test [id number]    nRBC 0.0 0.0 - 0.2 %   Neutrophils Relative % 80 %   Neutro Abs 5.5 1.7 - 7.7 K/uL   Lymphocytes Relative 10 %   Lymphs Abs 0.7 0.7 - 4.0 K/uL   Monocytes Relative 8 %   Monocytes Absolute 0.5 0.1 - 1.0 K/uL   Eosinophils Relative 2 %   Eosinophils Absolute 0.1 0.0 - 0.5 K/uL   Basophils Relative 0 %   Basophils Absolute  0.0 0.0 - 0.1 K/uL   Immature Granulocytes 0 %   Abs Immature Granulocytes 0.03 0.00 - 0.07 K/uL    Comment: Performed at [facility], [address] [place], [place] [address]  Comprehensive metabolic panel     Status: Abnormal   Collection Time: 03/20/21  3:46 AM  Result Value Ref Range   Sodium 136 135 - 145 mmol/L   Potassium 4.4 3.5 - 5.1 mmol/L   Chloride 110 98 - 111 mmol/L   CO2 18 (L) 22 - 32 mmol/L   Glucose, Bld 172 (H) 70 - 99 mg/dL    Comment: Glucose reference range applies only to samples taken after fasting for at least 8 hours.   BUN 48 (H) 8 - 23 mg/dL   Creatinine, Ser 1.45 (H) 0.61 - 1.24 mg/dL   Calcium 8.7 (L) 8.9 - 10.3 mg/dL   Total Protein 7.0 6.5 - 8.1 g/dL   Albumin 3.8 3.5 - 5.0 g/dL   AST 37 15 - 41 U/L   ALT 38 0 - 44 U/L   Alkaline Phosphatase 95 38 - 126 U/L   Total Bilirubin 0.6 0.3 - 1.2 mg/dL   GFR, Estimated 49 (L) >60 mL/min    Comment: (NOTE) Calculated using the CKD-EPI Creatinine Equation (2021)    Anion gap 8 5 - 15    Comment: Performed at [facility], [place], [place] [address]  Procalcitonin - Baseline     Status: None   Collection Time: 03/20/21  3:46 AM  Result Value Ref Range   Procalcitonin 0.61 ng/mL    Comment:        Interpretation: PCT > 0.5 ng/mL and <= 2 ng/mL: Systemic infection (sepsis) is possible, but other conditions are known to elevate PCT as well. (NOTE)  Sepsis PCT Algorithm           Lower Respiratory Tract                                      Infection PCT Algorithm    ----------------------------     ----------------------------         PCT < 0.25 ng/mL                PCT < 0.10 ng/mL          Strongly encourage             Strongly discourage   discontinuation of antibiotics    initiation of antibiotics    ----------------------------     -----------------------------       PCT 0.25 - 0.50 ng/mL            PCT 0.10 - 0.25 ng/mL               OR       >80%  decrease in PCT            Discourage initiation of                                            antibiotics      Encourage discontinuation           of antibiotics    ----------------------------     -----------------------------         PCT >= 0.50 ng/mL              PCT 0.26 - 0.50 ng/mL                AND       <80% decrease in PCT             Encourage initiation of                                             antibiotics       Encourage continuation           of antibiotics    ----------------------------     -----------------------------        PCT >= 0.50 ng/mL                  PCT > 0.50 ng/mL               AND         increase in PCT                  Strongly encourage                                      initiation of antibiotics    Strongly encourage escalation           of antibiotics                                     -----------------------------  PCT <= 0.25 ng/mL                                                 OR                                        > 80% decrease in PCT                                      Discontinue / Do not initiate                                             antibiotics  Performed at [facility], [address] [place], [place] [address]   Protime-INR     Status: None   Collection Time: 03/20/21  3:46 AM  Result Value Ref Range   Prothrombin Time 15.1 11.4 - 15.2 seconds   INR 1.2 0.8 - 1.2    Comment: (NOTE) INR goal varies based on device and disease states. Performed at [facility], [address] [place], [place] [address]   Brain natriuretic peptide     Status: Abnormal   Collection Time: 03/20/21  3:46 AM  Result Value Ref Range   B Natriuretic Peptide 155.6 (H) 0.0 - 100.0 pg/mL    Comment: Performed at [facility], [address] [place], [place] [address]  Lactic acid, plasma     Status: None   Collection Time: 03/20/21  3:52 AM  Result Value Ref  Range   Lactic Acid, Venous 0.9 0.5 - 1.9 mmol/L    Comment: Performed at [facility], [address] [place], [place] [address]  Blood culture (routine x 2)     Status: None (Preliminary result)   Collection Time: 03/20/21  3:52 AM   Specimen: BLOOD LEFT FOREARM  Result Value Ref Range   Specimen Description      BLOOD LEFT FOREARM Performed at [facility] [address] [place], [place] [address]    Special Requests      BOTTLES DRAWN AEROBIC AND ANAEROBIC Blood Culture adequate volume   Culture  Setup Time      AEROBIC BOTTLE ONLY GRAM POSITIVE COCCI Organism ID to follow CRITICAL RESULT CALLED TO, READ BACK BY AND VERIFIED WITH: ALEX CHAPPELL [id number] 03/20/21 HNM Performed at [facility], [address] [place],  [address]    Culture GRAM POSITIVE COCCI    Report Status PENDING   Blood Culture ID Panel (Reflexed)     Status: Abnormal   Collection Time: 03/20/21  3:52 AM  Result Value Ref Range   Enterococcus faecalis NOT DETECTED NOT DETECTED   Enterococcus Faecium NOT DETECTED NOT DETECTED   Listeria monocytogenes NOT DETECTED NOT DETECTED   Staphylococcus species DETECTED (A) NOT DETECTED    Comment: CRITICAL RESULT CALLED TO, READ BACK BY AND VERIFIED WITH: ALEX CHAPPELL PHARMD 2225 03/20/21 HNM    Staphylococcus aureus (BCID) DETECTED (A) NOT DETECTED    Comment: CRITICAL RESULT CALLED TO, READ BACK BY  AND VERIFIED WITH: ALEX [place] 2225 03/20/21 HNM    Staphylococcus epidermidis NOT DETECTED NOT DETECTED   Staphylococcus lugdunensis NOT DETECTED NOT DETECTED   Streptococcus species NOT DETECTED NOT DETECTED   Streptococcus agalactiae NOT DETECTED NOT DETECTED   Streptococcus pneumoniae NOT DETECTED NOT DETECTED   Streptococcus pyogenes NOT DETECTED NOT DETECTED   A.calcoaceticus-baumannii NOT DETECTED NOT DETECTED   Bacteroides fragilis NOT DETECTED NOT DETECTED   Enterobacterales NOT DETECTED NOT DETECTED    Enterobacter cloacae complex NOT DETECTED NOT DETECTED   Escherichia coli NOT DETECTED NOT DETECTED   Klebsiella aerogenes NOT DETECTED NOT DETECTED   Klebsiella oxytoca NOT DETECTED NOT DETECTED   Klebsiella pneumoniae NOT DETECTED NOT DETECTED   Proteus species NOT DETECTED NOT DETECTED   Salmonella species NOT DETECTED NOT DETECTED   Serratia marcescens NOT DETECTED NOT DETECTED   Haemophilus influenzae NOT DETECTED NOT DETECTED   Neisseria meningitidis NOT DETECTED NOT DETECTED   Pseudomonas aeruginosa NOT DETECTED NOT DETECTED   Stenotrophomonas maltophilia NOT DETECTED NOT DETECTED   Candida albicans NOT DETECTED NOT DETECTED   Candida auris NOT DETECTED NOT DETECTED   Candida glabrata NOT DETECTED NOT DETECTED   Candida krusei NOT DETECTED NOT DETECTED   Candida parapsilosis NOT DETECTED NOT DETECTED   Candida tropicalis NOT DETECTED NOT DETECTED   Cryptococcus neoformans/gattii NOT DETECTED NOT DETECTED   Meth resistant mecA/C and MREJ NOT DETECTED NOT DETECTED    Comment: Performed at [facility], [address] [place], [place] [address]  Resp Panel by RT-PCR (Flu A&B, Covid) Nasopharyngeal Swab     Status: None   Collection Time: 03/20/21  4:01 AM   Specimen: Nasopharyngeal Swab; Nasopharyngeal(NP) swabs in vial transport medium  Result Value Ref Range   SARS Coronavirus 2 by RT PCR NEGATIVE NEGATIVE    Comment: (NOTE) SARS-CoV-2 target nucleic acids are NOT DETECTED.  The SARS-CoV-2 RNA is generally detectable in upper respiratory specimens during the acute phase of infection. The lowest concentration of SARS-CoV-2 viral copies this assay can detect is 138 copies/mL. A negative result does not preclude SARS-Cov-2 infection and should not be used as the sole basis for treatment or other patient management decisions. A negative result may occur with  improper specimen collection/handling, submission of specimen other than nasopharyngeal swab, presence of  viral mutation(s) within the areas targeted by this assay, and inadequate number of viral copies(<138 copies/mL). A negative result must be combined with clinical observations, patient history, and epidemiological information. The expected result is Negative.  Fact Sheet for Patients:  EntrepreneurPulse.com.au  Fact Sheet for Healthcare Providers:  IncredibleEmployment.be  This test is no t yet approved or cleared by the [place] FDA and  has been authorized for detection and/or diagnosis of SARS-CoV-2 by FDA under an Emergency Use Authorization (EUA). This EUA will remain  in effect (meaning this test can be used) for the duration of the COVID-19 declaration under Section 564(b)(1) of the Act, 21 U.S.C.section 360bbb-3(b)(1), unless the authorization is terminated  or revoked sooner.       Influenza A by PCR NEGATIVE NEGATIVE   Influenza B by PCR NEGATIVE NEGATIVE    Comment: (NOTE) The Xpert Xpress SARS-CoV-2/FLU/RSV plus assay is intended as an aid in the diagnosis of influenza from Nasopharyngeal swab specimens and should not be used as a sole basis for treatment. Nasal washings and aspirates are unacceptable for Xpert Xpress SARS-CoV-2/FLU/RSV testing.  Fact Sheet for Patients: EntrepreneurPulse.com.au  Fact Sheet for Healthcare Providers: IncredibleEmployment.be  This test is not yet approved or cleared by the [place] and has been authorized for detection and/or diagnosis of SARS-CoV-2 by FDA under an Emergency Use Authorization (EUA). This EUA will remain in effect (meaning this test can be used) for the duration of the COVID-19 declaration under Section 564(b)(1) of the Act, 21 U.S.C. section 360bbb-3(b)(1), unless the authorization is terminated or revoked.  Performed at [facility], [address] [place], [place] [address]   Sedimentation rate     Status:  Abnormal   Collection Time: 03/20/21  7:44 AM  Result Value Ref Range   Sed Rate 70 (H) 0 - 20 mm/hr    Comment: Performed at [facility], [address] [place], [place] [address]  CBG monitoring, ED     Status: Abnormal   Collection Time: 03/20/21  7:46 AM  Result Value Ref Range   Glucose-Capillary 253 (H) 70 - 99 mg/dL    Comment: Glucose reference range applies only to samples taken after fasting for at least 8 hours.  Blood culture (routine x 2)     Status: None (Preliminary result)   Collection Time: 03/20/21 11:43 AM   Specimen: BLOOD  Result Value Ref Range   Specimen Description BLOOD WRIST    Special Requests      BOTTLES DRAWN AEROBIC AND ANAEROBIC Blood Culture results may not be optimal due to an excessive volume of blood received in culture bottles   Culture      NO GROWTH < 24 HOURS Performed at [facility], [address] [place], [place] [address]    Report Status PENDING   C-reactive protein     Status: Abnormal   Collection Time: 03/20/21 11:43 AM  Result Value Ref Range   CRP 11.6 (H) <1.0 mg/dL    Comment: Performed at [facility], [place] [address] [place], [place] [address]  Glucose, capillary     Status: Abnormal   Collection Time: 03/20/21 12:30 PM  Result Value Ref Range   Glucose-Capillary 148 (H) 70 - 99 mg/dL    Comment: Glucose reference range applies only to samples taken after fasting for at least 8 hours.   Comment 1 Notify RN    Comment 2 Document in Chart   Glucose, capillary     Status: Abnormal   Collection Time: 03/20/21  4:32 PM  Result Value Ref Range   Glucose-Capillary 186 (H) 70 - 99 mg/dL    Comment: Glucose reference range applies only to samples taken after fasting for at least 8 hours.  Glucose, capillary     Status: Abnormal   Collection Time: 03/20/21  9:10 PM  Result Value Ref Range   Glucose-Capillary 156 (H) 70 - 99 mg/dL    Comment: Glucose reference range applies only to samples taken after  fasting for at least 8 hours.   Comment 1 Notify RN    Comment 2 Document in Chart   Glucose, capillary     Status: Abnormal   Collection Time: 03/20/21 10:06 PM  Result Value Ref Range   Glucose-Capillary 170 (H) 70 - 99 mg/dL    Comment: Glucose reference range applies only to samples taken after fasting for at least 8 hours.   Comment 1 Notify RN   Procalcitonin     Status: None   Collection Time: 03/21/21  4:27 AM  Result Value Ref Range   Procalcitonin 0.58 ng/mL    Comment:  Interpretation: PCT > 0.5 ng/mL and <= 2 ng/mL: Systemic infection (sepsis) is possible, but other conditions are known to elevate PCT as well. (NOTE)       Sepsis PCT Algorithm           Lower Respiratory Tract                                      Infection PCT Algorithm    ----------------------------     ----------------------------         PCT < 0.25 ng/mL                PCT < 0.10 ng/mL          Strongly encourage             Strongly discourage   discontinuation of antibiotics    initiation of antibiotics    ----------------------------     -----------------------------       PCT 0.25 - 0.50 ng/mL            PCT 0.10 - 0.25 ng/mL               OR       >80% decrease in PCT            Discourage initiation of                                            antibiotics      Encourage discontinuation           of antibiotics    ----------------------------     -----------------------------         PCT >= 0.50 ng/mL              PCT 0.26 - 0.50 ng/mL                AND       <80% decrease in PCT             Encourage initiation of                                             antibiotics       Encourage continuation           of antibiotics    ----------------------------     -----------------------------        PCT >= 0.50 ng/mL                  PCT > 0.50 ng/mL               AND         increase in PCT                  Strongly encourage                                      initiation of  antibiotics    Strongly encourage escalation           of antibiotics                                     -----------------------------  PCT <= 0.25 ng/mL                                                 OR                                        > 80% decrease in PCT                                      Discontinue / Do not initiate                                             antibiotics  Performed at [facility], [address] [place], [place] [address]   Basic metabolic panel     Status: Abnormal   Collection Time: 03/21/21  4:27 AM  Result Value Ref Range   Sodium 136 135 - 145 mmol/L   Potassium 4.0 3.5 - 5.1 mmol/L   Chloride 110 98 - 111 mmol/L   CO2 19 (L) 22 - 32 mmol/L   Glucose, Bld 252 (H) 70 - 99 mg/dL    Comment: Glucose reference range applies only to samples taken after fasting for at least 8 hours.   BUN 38 (H) 8 - 23 mg/dL   Creatinine, Ser 1.45 (H) 0.61 - 1.24 mg/dL   Calcium 8.5 (L) 8.9 - 10.3 mg/dL   GFR, Estimated 49 (L) >60 mL/min    Comment: (NOTE) Calculated using the CKD-EPI Creatinine Equation (2021)    Anion gap 7 5 - 15    Comment: Performed at [facility], [address] [place], [place] [address]  CBC     Status: Abnormal   Collection Time: 03/21/21  4:27 AM  Result Value Ref Range   WBC 6.4 4.0 - 10.5 K/uL   RBC 2.96 (L) 4.22 - 5.81 MIL/uL   Hemoglobin 9.0 (L) 13.0 - 17.0 g/dL   HCT 27.0 (L) 39.0 - 52.0 %   MCV 91.2 80.0 - 100.0 fL   MCH 30.4 26.0 - 34.0 pg   MCHC 33.3 30.0 - 36.0 g/dL   RDW 13.8 11.5 - 15.5 %   Platelets 84 (L) 150 - 400 K/uL    Comment: Immature Platelet Fraction may be clinically indicated, consider ordering this additional test [id number] CONSISTENT WITH PREVIOUS RESULT    nRBC 0.0 0.0 - 0.2 %    Comment: Performed at [facility], [address] [place], [place] [address]  Glucose, capillary     Status: Abnormal   Collection Time: 03/21/21   7:38 AM  Result Value Ref Range   Glucose-Capillary 218 (H) 70 - 99 mg/dL    Comment: Glucose reference range applies only to samples taken after fasting for at least 8 hours.   Comment 1 Notify RN    No components found for: ESR, C REACTIVE PROTEIN MICRO: Recent Results (from the past 720 hour(s))  SARS CORONAVIRUS 2 (TAT 6-24 HRS) Nasopharyngeal Nasopharyngeal Swab     Status: None   Collection Time: 03/07/21 12:42 PM  Specimen: Nasopharyngeal Swab  Result Value Ref Range Status   SARS Coronavirus 2 NEGATIVE NEGATIVE Final    Comment: (NOTE) SARS-CoV-2 target nucleic acids are NOT DETECTED.  The SARS-CoV-2 RNA is generally detectable in upper and lower respiratory specimens during the acute phase of infection. Negative results do not preclude SARS-CoV-2 infection, do not rule out co-infections with other pathogens, and should not be used as the sole basis for treatment or other patient management decisions. Negative results must be combined with clinical observations, patient history, and epidemiological information. The expected result is Negative.  Fact Sheet for Patients: SugarRoll.be  Fact Sheet for Healthcare Providers: https://www.woods-mathews.com/  This test is not yet approved or cleared by the [place] FDA and  has been authorized for detection and/or diagnosis of SARS-CoV-2 by FDA under an Emergency Use Authorization (EUA). This EUA will remain  in effect (meaning this test can be used) for the duration of the COVID-19 declaration under Se ction 564(b)(1) of the Act, 21 U.S.C. section 360bbb-3(b)(1), unless the authorization is terminated or revoked sooner.  Performed at [facility], [place] [address] [place], [place] [address]   Blood culture (routine x 2)     Status: None (Preliminary result)   Collection Time: 03/20/21  3:52 AM   Specimen: BLOOD LEFT FOREARM  Result Value Ref Range Status   Specimen  Description   Final    BLOOD LEFT FOREARM Performed at [facility], [place] [address], [place], [place] [address]    Special Requests   Final    BOTTLES DRAWN AEROBIC AND ANAEROBIC Blood Culture adequate volume   Culture  Setup Time   Final    AEROBIC BOTTLE ONLY GRAM POSITIVE COCCI Organism ID to follow CRITICAL RESULT CALLED TO, READ BACK BY AND VERIFIED WITH: ALEX CHAPPELL [id number] 03/20/21 HNM Performed at [facility], [address] [place], [place] [address]    Culture [facility] POSITIVE COCCI  Final   Report Status PENDING  Incomplete  Blood Culture ID Panel (Reflexed)     Status: Abnormal   Collection Time: 03/20/21  3:52 AM  Result Value Ref Range Status   Enterococcus faecalis NOT DETECTED NOT DETECTED Final   Enterococcus Faecium NOT DETECTED NOT DETECTED Final   Listeria monocytogenes NOT DETECTED NOT DETECTED Final   Staphylococcus species DETECTED (A) NOT DETECTED Final    Comment: CRITICAL RESULT CALLED TO, READ BACK BY AND VERIFIED WITH: ALEX CHAPPELL PHARMD [id number] 03/20/21 HNM    Staphylococcus aureus (BCID) DETECTED (A) NOT DETECTED Final    Comment: CRITICAL RESULT CALLED TO, READ BACK BY AND VERIFIED WITH: [place]ier [id number] 03/20/21 HNM    Staphylococcus epidermidis NOT DETECTED NOT DETECTED Final   Staphylococcus lugdunensis NOT DETECTED NOT DETECTED Final   Streptococcus species NOT DETECTED NOT DETECTED Final   Streptococcus agalactiae NOT DETECTED NOT DETECTED Final   Streptococcus pneumoniae NOT DETECTED NOT DETECTED Final   Streptococcus pyogenes NOT DETECTED NOT DETECTED Final   A.calcoaceticus-baumannii NOT DETECTED NOT DETECTED Final   Bacteroides fragilis NOT DETECTED NOT DETECTED Final   Enterobacterales NOT DETECTED NOT DETECTED Final   Enterobacter cloacae complex NOT DETECTED NOT DETECTED Final   Escherichia coli NOT DETECTED NOT DETECTED Final   Klebsiella aerogenes NOT DETECTED NOT DETECTED Final   Klebsiella oxytoca NOT  DETECTED NOT DETECTED Final   Klebsiella pneumoniae NOT DETECTED NOT DETECTED Final   Proteus species NOT DETECTED NOT DETECTED Final   Salmonella species NOT DETECTED NOT  DETECTED Final   Serratia marcescens NOT DETECTED NOT DETECTED Final   Haemophilus influenzae NOT DETECTED NOT DETECTED Final   Neisseria meningitidis NOT DETECTED NOT DETECTED Final   Pseudomonas aeruginosa NOT DETECTED NOT DETECTED Final   Stenotrophomonas maltophilia NOT DETECTED NOT DETECTED Final   Candida albicans NOT DETECTED NOT DETECTED Final   Candida auris NOT DETECTED NOT DETECTED Final   Candida glabrata NOT DETECTED NOT DETECTED Final   Candida krusei NOT DETECTED NOT DETECTED Final   Candida parapsilosis NOT DETECTED NOT DETECTED Final   Candida tropicalis NOT DETECTED NOT DETECTED Final   Cryptococcus neoformans/gattii NOT DETECTED NOT DETECTED Final   Meth resistant mecA/C and MREJ NOT DETECTED NOT DETECTED Final    Comment: Performed at [facility], [address] [place], [place] [address]  Resp Panel by RT-PCR (Flu A&B, Covid) Nasopharyngeal Swab     Status: None   Collection Time: 03/20/21  4:01 AM   Specimen: Nasopharyngeal Swab; Nasopharyngeal(NP) swabs in vial transport medium  Result Value Ref Range Status   SARS Coronavirus 2 by RT PCR NEGATIVE NEGATIVE Final    Comment: (NOTE) SARS-CoV-2 target nucleic acids are NOT DETECTED.  The SARS-CoV-2 RNA is generally detectable in upper respiratory specimens during the acute phase of infection. The lowest concentration of SARS-CoV-2 viral copies this assay can detect is 138 copies/mL. A negative result does not preclude SARS-Cov-2 infection and should not be used as the sole basis for treatment or other patient management decisions. A negative result may occur with  improper specimen collection/handling, submission of specimen other than nasopharyngeal swab, presence of viral mutation(s) within the areas targeted by this assay, and  inadequate number of viral copies(<138 copies/mL). A negative result must be combined with clinical observations, patient history, and epidemiological information. The expected result is Negative.  Fact Sheet for Patients:  EntrepreneurPulse.com.au  Fact Sheet for Healthcare Providers:  IncredibleEmployment.be  This test is no t yet approved or cleared by the [place] FDA and  has been authorized for detection and/or diagnosis of SARS-CoV-2 by FDA under an Emergency Use Authorization (EUA). This EUA will remain  in effect (meaning this test can be used) for the duration of the COVID-19 declaration under Section 564(b)(1) of the Act, 21 U.S.C.section 360bbb-3(b)(1), unless the authorization is terminated  or revoked sooner.       Influenza A by PCR NEGATIVE NEGATIVE Final   Influenza B by PCR NEGATIVE NEGATIVE Final    Comment: (NOTE) The Xpert Xpress SARS-CoV-2/FLU/RSV plus assay is intended as an aid in the diagnosis of influenza from Nasopharyngeal swab specimens and should not be used as a sole basis for treatment. Nasal washings and aspirates are unacceptable for Xpert Xpress SARS-CoV-2/FLU/RSV testing.  Fact Sheet for Patients: EntrepreneurPulse.com.au  Fact Sheet for Healthcare Providers: IncredibleEmployment.be  This test is not yet approved or cleared by the [place] FDA and has been authorized for detection and/or diagnosis of SARS-CoV-2 by FDA under an Emergency Use Authorization (EUA). This EUA will remain in effect (meaning this test can be used) for the duration of the COVID-19 declaration under Section 564(b)(1) of the Act, 21 U.S.C. section 360bbb-3(b)(1), unless the authorization is terminated or revoked.  Performed at [facility], [address] [place], [place] [address]   Blood culture (routine x 2)     Status: None (Preliminary result)   Collection Time:  03/20/21 11:43 AM   Specimen: BLOOD  Result Value Ref Range Status   Specimen Description BLOOD  WRIST  Final   Special Requests   Final    BOTTLES DRAWN AEROBIC AND ANAEROBIC Blood Culture results may not be optimal due to an excessive volume of blood received in culture bottles   Culture   Final    NO GROWTH < 24 HOURS Performed at [facility], [address] [place], [place] [address]    Report Status PENDING  Incomplete    IMAGING: DG Lumbar Spine Complete W/Bend  Result Date: 03/04/2021 CLINICAL DATA:  Low back pain with radicular symptoms EXAM: LUMBAR SPINE - COMPLETE WITH BENDING VIEWS COMPARISON:  October 07, 2020 FINDINGS: Frontal, neutral lateral, flexion lateral, extension lateral, spot lumbosacral lateral, and bilateral oblique views were obtained. There are 5 non-rib-bearing lumbar type vertebral bodies. There is mild lumbar dextroscoliosis. There is no demonstrable fracture. There is no appreciable spondylolisthesis on neutral lateral imaging. There is no appreciable change in lateral alignment between neutral lateral, flexion lateral, and extension lateral imaging. There is mild disc space narrowing at all levels. There is facet osteoarthritic change at L5-S1 on the left. Other facets appear unremarkable. IMPRESSION: No fracture. No spondylolisthesis on neutral lateral imaging. No change in lateral alignment between neutral lateral, flexion lateral, and extension lateral imaging. Disc space narrowing, relatively mild, at all levels. Facet osteoarthritic change noted at L5-S1 on the left. Electronically Signed   By: Lowella Grip III M.D.   On: 03/04/2021 14:24   US RENAL  Result Date: 03/18/2021 CLINICAL DATA:  Stage 3 chronic kidney disease Type 2 diabetes EXAM: RENAL / URINARY TRACT ULTRASOUND COMPLETE COMPARISON:  None. FINDINGS: Right Kidney: Renal measurements: 11.0 x 5.4 x 5.7 cm = volume: 176 mL. Echogenicity within normal limits. No mass or hydronephrosis  visualized. Left Kidney: Renal measurements: 10.3 x 5.0 x 4.8 cm = volume: 130 mL. Echogenicity within normal limits. No mass or hydronephrosis visualized. Limited visualization due to shadowing bowel gas. Bladder: Appears normal for degree of bladder distention. Other: None. IMPRESSION: No significant abnormality of the kidneys. Electronically Signed   By: Miachel Roux M.D.   On: 03/18/2021 09:37   DG Foot Complete Right  Result Date: 03/20/2021 CLINICAL DATA:  80 year old male status post right foot surgery for infection a few days ago. Hypotension, dizziness. EXAM: RIGHT FOOT COMPLETE - 3+ VIEW COMPARISON:  Right foot series 02/13/2021. FINDINGS: Right great toe osteotomy about the IP joint which has been resected. Generalized soft tissue swelling in the right great toe and medial foot. No soft tissue gas. No definite cortical osteolysis along the osteotomy margins. First MTP and other joint spaces appear stable from last month. Calcified peripheral vascular disease. No new osseous abnormality. IMPRESSION: 1. Interval right great toe osteotomy about the IP joint which was resected. No superimposed plain radiographic evidence of osteomyelitis at this time. 2. Generalized soft tissue swelling. Calcified peripheral vascular disease. Electronically Signed   By: Genevie Ann M.D.   On: 03/20/2021 04:45   VAS [place] ABI WITH/WO TBI  Result Date: 03/21/2021 LOWER EXTREMITY DOPPLER STUDY Indications: Peripheral artery disease.  Vascular Interventions: 02/17/21 Right PTA of SFA, DP, and ATA. Performing Technologist: Concha Norway RVT  Examination Guidelines: A complete evaluation includes at minimum, Doppler waveform signals and systolic blood pressure reading at the level of bilateral brachial, anterior tibial, and posterior tibial arteries, when vessel segments are accessible. Bilateral testing is considered an integral part of a complete examination. Photoelectric Plethysmograph (PPG) waveforms and toe systolic pressure  readings are included as required and additional duplex  testing as needed. Limited examinations for reoccurring indications may be performed as noted.  ABI Findings: +---------+------------------+-----+---------+--------+ Right    Rt Pressure (mmHg)IndexWaveform Comment  +---------+------------------+-----+---------+--------+ Brachial 159                                      +---------+------------------+-----+---------+--------+ ATA      187               1.18 biphasic          +---------+------------------+-----+---------+--------+ PTA      185               1.16 triphasic         +---------+------------------+-----+---------+--------+ Great Toe146               0.92 Normal            +---------+------------------+-----+---------+--------+ +---------+------------------+-----+--------+-------+ Left     Lt Pressure (mmHg)IndexWaveformComment +---------+------------------+-----+--------+-------+ ATA      201               1.26 biphasic        +---------+------------------+-----+--------+-------+ PTA      196               1.23 biphasic        +---------+------------------+-----+--------+-------+ Great Toe143               0.90 Abnormal        +---------+------------------+-----+--------+-------+ +-------+-----------+-----------+------------+------------+ ABI/TBIToday's ABIToday's TBIPrevious ABIPrevious TBI +-------+-----------+-----------+------------+------------+ Right  1.18       .92        1.08        .73          +-------+-----------+-----------+------------+------------+ Left   1.26       .90        [place]          .81          +-------+-----------+-----------+------------+------------+  Summary: Right: Resting right ankle-brachial index is within normal range. No evidence of significant right lower extremity arterial disease. The right toe-brachial index is normal. Left: Resting left ankle-brachial index is within normal range. No evidence of  significant left lower extremity arterial disease. The left toe-brachial index is normal.  *See table(s) above for measurements and observations.  Electronically signed by Festus Barren MD on 03/21/2021 at 10:09:19 AM.   Final    SURGICAL PATHOLOGY  CASE: [phone]  PATIENT: Nathan Singleton  Surgical Pathology Report      Specimen Submitted:  A. Right Great Toe   Clinical History: Non-healing ulcer right great toe       DIAGNOSIS:  A. TOE, RIGHT GREAT; IRRITATION AND DEBRIDEMENT:  - TWO BONE FRAGMENTS, NEGATIVE FOR ACUTE OSTEOMYELITIS AT INKED SURFACE.  - BONE AND SOFT TISSUE WITH NON-SPECIFIC REACTIVE CHANGES. Assessment:   Nathan Singleton is a 80 y.o. 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 Now readmitted with dizziness, hypotension and found to have MSSA bacteremia. Now s/p repeat surgery with toe amputation.  He is relatively stable and no signs of metatatic infection however he has a bioprosthetic valve, PPM, bil TKR and R shoulder replacment  Recommendations Will need TTE (ordered) if neg then TEE (has bioprosthetic valve and Pacemaker so high risk of endocarditis). Will need aggressive treatment given the prosthetic  joints and the PPM wires and prosthetic valve Can narrow to cefazolin. Repeat bcx.  Once neg x 48 hours can place picc line.  Thank you very much for allowing me to participate in the care of this patient. Please call with questions.   Cheral Marker. Ola Spurr, MD

## 2021-03-21 NOTE — Progress Notes (Addendum)
PROGRESS NOTE    Nathan Singleton   [id number]  DOB: [birth year]  PCP: Ranae Plumber, PA    DOA: 03/20/2021 LOS: 1   Brief Narrative   Nathan Singleton is a 80 y.o. male with medical history significant of PVD, HTN, HLD, DM, gout, s/p of TAVR with bioprosthesis, pacemaker placement, CAD, CKD-3, BPH, thrombocytopenia, anemia, chronic pain, who presented to the ED on 03/20/21 with dizziness and worsening right foot pain in addition to swelling and progressive erythema.  Due to dizziness, pt checked BP which was 92/55.  EMS was called.   Pt hospitalized 3/3 to 3/11 due to infected diabetic foot ulcer and PVD. Underwent I&D with biopsy on 02/18/2021.  Biopsy did not show osteomyelitis. Treated with IV Vancomycin, Rocephin and Flagyl and discharged on Keflex for 10 days. Vascular surgery was consulted for PVD. Pt underwent lower extremity angiogram and angioplasty of the SFA and percutaneous transluminal angioplasty of the right dorsalis podalic artery and distal anterior tibial artery. Started on aspirin and Plavix.  Xray of Right Foot in the ED showed:  1. Interval right great toe osteotomy about the IP joint which was resected. No superimposed plain radiographic evidence of osteomyelitis at this time. 2. Generalized soft tissue swelling. Calcified peripheral vascular disease.   Admitted to hospitalist service.  Dr. Cleda Mccreedy of podiatry consulted.  Patient underwent amputation of the Right great toe on evening of 03/20/21.  On IV antibiotics with surgical culture / path pending. Infectious disease is following.    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 --Podiatry, Infectious Disease following --Antibiotics: Initially started on broad-spectrum vague, Flagyl, Rocephin --Follow blood cultures, inflammatory markers --Continue maintenance IV fluids for now.  Can stop if p.o. intake adequate and BP stable --Pain control as needed --Bowel regimen --PT evaluation  Dizziness -most likely due to soft BP, dehydration.  No focal neurologic findings on exam are reported by patient. --Improved with IV hydration and improved BP --Orthostatics --PT and OT eval's  Positive blood culture - one of four bottles with +MSSA, suspect contaminant, but cannot exclude hematogenous spread of foot infection. --Repeat blood culture ordered, follow  Essential hypertension -antihypertensives held on admission due to soft BP.  Monitor BP and resume meds when indicated.  Insulin-dependent type 2 diabetes -Home regimen is Lantus 25 units and Victoza.  Initially placed on 15 units Lantus and sliding scale.   --Increase Lantus to 20 units at bedtime --Add NovoLog 3 units 3 times daily with meals --Continue sliding scale NovoLog --Hold Victoza  Hyperglycemia - CBG's in 200's today, insulin adjusted as above.  CKD stage IIIa -renal function stable.  Monitor BMP.  Peripheral vascular disease -with recent right lower extremity angioplasty as above. --Continue aspirin Plavix and Crestor  Coronary artery disease -stable with no chest pain --Continue aspirin, Plavix, Crestor, Imdur  Thrombocytopenia -chronic, stable.  No signs of bleeding. --Follow CBC --Monitor for signs of bleeding  Normocytic anemia -with hemoglobin stable 9's --Monitor CBC  COPD -stable  without exacerbation symptoms at this time --Albuterol as needed  Depression -continue Zoloft  GERD without esophagitis -continue  PPI  Gout -continue allopurinol  Hyperlipidemia -continue Crestor  Obesity: Body mass index is 31.46 kg/m.  Complicates overall care and prognosis.  Recommend lifestyle modifications including physical activity and diet for weight loss and overall long-term health.   DVT prophylaxis: SCDs Start: 03/20/21 0730   Diet:  Diet Orders (From admission, onward)    Start     Ordered   03/20/21 2123  Diet Carb Modified Fluid consistency: Thin; Room service appropriate? Yes  Diet effective now       Question Answer Comment  Calorie Level Medium 1600-2000   Fluid consistency: Thin   Room service appropriate? Yes      03/20/21 2122            Code Status: Full Code    Subjective 03/21/21    Patient seen bedside this morning awake laying in bed.  Reports some foot pain but says not that bad currently.  Denies fevers or chills, nausea vomiting, headache chest pain or shortness of breath.  No other acute complaints.   Disposition Plan & Communication   Status is: Inpatient  Inpatient status appropriate due to severity of illness requiring IV therapies and ongoing evaluation awaiting surgical cultures and pathology  Dispo: The patient is from: Home              Anticipated d/c is to: Home              Patient currently not medically stable for discharge   Difficult to place patient no   Consults, Procedures, Significant Events   Consultants:   Podiatry  Infectious disease  Procedures:   Right great toe amputation on 03/20/2021  Antimicrobials:  Anti-infectives (From admission, onward)   Start     Dose/Rate Route Frequency Ordered Stop   03/21/21 2200  ceFAZolin (ANCEF) IVPB 2g/100 mL premix        2 g 200 mL/hr over 30 Minutes Intravenous Every 8 hours 03/21/21 1130     03/21/21 0800  vancomycin (VANCOREADY) IVPB 1000 mg/200 mL  Status:  Discontinued        1,000 mg 200 mL/hr over 60 Minutes Intravenous Every 24 hours 03/20/21 1515 03/21/21 1130   03/20/21 0800   cefTRIAXone (ROCEPHIN) 2 g in sodium chloride 0.9 % 100 mL IVPB  Status:  Discontinued        2 g 200 mL/hr over 30 Minutes Intravenous Every 24 hours 03/20/21 0727 03/21/21 1130   03/20/21 0800  metroNIDAZOLE (FLAGYL) IVPB 500 mg  Status:  Discontinued        500 mg 100 mL/hr over 60 Minutes Intravenous Every 8 hours 03/20/21 0727 03/21/21 1130   03/20/21 0800  vancomycin (VANCOCIN) IVPB 1000 mg/200 mL premix  Status:  Discontinued        1,000 mg 200 mL/hr over 60 Minutes Intravenous Every 24 hours 03/20/21 0732 03/20/21 1515   03/20/21 0400  piperacillin-tazobactam (ZOSYN) IVPB 3.375 g        3.375 g 100 mL/hr over 30 Minutes Intravenous  Once 03/20/21 0353 03/20/21 0444   03/20/21 0400  vancomycin (VANCOCIN) IVPB 1000 mg/200 mL premix        1,000 mg 200 mL/hr over 60 Minutes Intravenous  Once 03/20/21 0353 03/20/21 0552        Micro    Objective   Vitals:   03/21/21 0357 03/21/21 0742 03/21/21 1137 03/21/21  1521  BP: (!) 143/60 (!) 126/57 (!) 113/52 118/60  Pulse: 72 76 74 68  Resp: '18 18 19 16  '$ Temp: 99 F (37.2 C) 99.2 F (37.3 C) 98.6 F (37 C) 99 F (37.2 C)  TempSrc: Oral Oral Oral Oral  SpO2: 98% 98% 97% 97%  Weight:      Height:        Intake/Output Summary (Last 24 hours) at 03/21/2021 1706 Last data filed at 03/21/2021 1500 Gross per 24 hour  Intake 1138.71 ml  Output 210 ml  Net 928.71 ml   Filed Weights   03/20/21 0332  Weight: 105.2 kg    Physical Exam:  General exam: awake, alert, no acute distress HEENT: Dyskinesia of mouth noted, moist mucus membranes, hearing grossly normal  Respiratory system: CTAB, no wheezes, rales or rhonchi, normal respiratory effort. Cardiovascular system: normal S1/S2, RRR, no JVD, murmurs, rubs, gallops, trace lower extremity edema bilaterally.   Gastrointestinal system: soft, NT, ND, +bowel sounds. Central nervous system: A&O x3. no gross focal neurologic deficits, normal speech Extremities: Right foot Ace bandage  intact, boot in place, normal tone Psychiatry: normal mood, congruent affect   Labs   Data Reviewed: I have personally reviewed following labs and imaging studies  CBC: Recent Labs  Lab 03/20/21 0346 03/21/21 0427  WBC 6.9 6.4  NEUTROABS 5.5  --   HGB 9.6* 9.0*  HCT 28.0* 27.0*  MCV 91.5 91.2  PLT 95* 84*   Basic Metabolic Panel: Recent Labs  Lab 03/20/21 0346 03/21/21 0427  NA 136 136  K 4.4 4.0  CL 110 110  CO2 18* 19*  GLUCOSE 172* 252*  BUN 48* 38*  CREATININE 1.45* 1.45*  CALCIUM 8.7* 8.5*   GFR: Estimated Creatinine Clearance: 52.6 mL/min (A) (by C-G formula based on SCr of 1.45 mg/dL (H)). Liver Function Tests: Recent Labs  Lab 03/20/21 0346  AST 37  ALT 38  ALKPHOS 95  BILITOT 0.6  PROT 7.0  ALBUMIN 3.8   No results for input(s): LIPASE, AMYLASE in the last 168 hours. No results for input(s): AMMONIA in the last 168 hours. Coagulation Profile: Recent Labs  Lab 03/20/21 0346  INR 1.2   Cardiac Enzymes: No results for input(s): CKTOTAL, CKMB, CKMBINDEX, TROPONINI in the last 168 hours. BNP (last 3 results) No results for input(s): PROBNP in the last 8760 hours. HbA1C: No results for input(s): HGBA1C in the last 72 hours. CBG: Recent Labs  Lab 03/20/21 2110 03/20/21 2206 03/21/21 0738 03/21/21 1132 03/21/21 1623  GLUCAP 156* 170* 218* 299* 229*   Lipid Profile: No results for input(s): CHOL, HDL, LDLCALC, TRIG, CHOLHDL, LDLDIRECT in the last 72 hours. Thyroid Function Tests: No results for input(s): TSH, T4TOTAL, FREET4, T3FREE, THYROIDAB in the last 72 hours. Anemia Panel: No results for input(s): VITAMINB12, FOLATE, FERRITIN, TIBC, IRON, RETICCTPCT in the last 72 hours. Sepsis Labs: Recent Labs  Lab 03/20/21 0346 03/20/21 0352 03/21/21 0427  PROCALCITON 0.61  --  0.58  LATICACIDVEN  --  0.9  --     Recent Results (from the past 240 hour(s))  Blood culture (routine x 2)     Status: None (Preliminary result)   Collection  Time: 03/20/21  3:52 AM   Specimen: BLOOD LEFT FOREARM  Result Value Ref Range Status   Specimen Description   Final    BLOOD LEFT FOREARM Performed at [facility], [place] [address], [place], [place] [address]    Special Requests   Final  BOTTLES DRAWN AEROBIC AND ANAEROBIC Blood Culture adequate volume   Culture  Setup Time   Final    AEROBIC BOTTLE ONLY GRAM POSITIVE COCCI Organism ID to follow CRITICAL RESULT CALLED TO, READ BACK BY AND VERIFIED WITH: ALEX CHAPPELL [id number] 03/20/21 HNM Performed at [facility], [address] [place], [place] [address]    Culture GRAM POSITIVE COCCI  Final   Report Status PENDING  Incomplete  Blood Culture ID Panel (Reflexed)     Status: Abnormal   Collection Time: 03/20/21  3:52 AM  Result Value Ref Range Status   Enterococcus faecalis NOT DETECTED NOT DETECTED Final   Enterococcus Faecium NOT DETECTED NOT DETECTED Final   Listeria monocytogenes NOT DETECTED NOT DETECTED Final   Staphylococcus species DETECTED (A) NOT DETECTED Final    Comment: CRITICAL RESULT CALLED TO, READ BACK BY AND VERIFIED WITH: ALEX CHAPPELL PHARMD [id number] 03/20/21 HNM    Staphylococcus aureus (BCID) DETECTED (A) NOT DETECTED Final    Comment: CRITICAL RESULT CALLED TO, READ BACK BY AND VERIFIED WITH: [place] [id number] 03/20/21 HNM    Staphylococcus epidermidis NOT DETECTED NOT DETECTED Final   Staphylococcus lugdunensis NOT DETECTED NOT DETECTED Final   Streptococcus species NOT DETECTED NOT DETECTED Final   Streptococcus agalactiae NOT DETECTED NOT DETECTED Final   Streptococcus pneumoniae NOT DETECTED NOT DETECTED Final   Streptococcus pyogenes NOT DETECTED NOT DETECTED Final   A.calcoaceticus-baumannii NOT DETECTED NOT DETECTED Final   Bacteroides fragilis NOT DETECTED NOT DETECTED Final   Enterobacterales NOT DETECTED NOT DETECTED Final   Enterobacter cloacae complex NOT DETECTED NOT DETECTED Final   Escherichia coli NOT DETECTED NOT  DETECTED Final   Klebsiella aerogenes NOT DETECTED NOT DETECTED Final   Klebsiella oxytoca NOT DETECTED NOT DETECTED Final   Klebsiella pneumoniae NOT DETECTED NOT DETECTED Final   Proteus species NOT DETECTED NOT DETECTED Final   Salmonella species NOT DETECTED NOT DETECTED Final   Serratia marcescens NOT DETECTED NOT DETECTED Final   Haemophilus influenzae NOT DETECTED NOT DETECTED Final   Neisseria meningitidis NOT DETECTED NOT DETECTED Final   Pseudomonas aeruginosa NOT DETECTED NOT DETECTED Final   Stenotrophomonas maltophilia NOT DETECTED NOT DETECTED Final   Candida albicans NOT DETECTED NOT DETECTED Final   Candida auris NOT DETECTED NOT DETECTED Final   Candida glabrata NOT DETECTED NOT DETECTED Final   Candida krusei NOT DETECTED NOT DETECTED Final   Candida parapsilosis NOT DETECTED NOT DETECTED Final   Candida tropicalis NOT DETECTED NOT DETECTED Final   Cryptococcus neoformans/gattii NOT DETECTED NOT DETECTED Final   Meth resistant mecA/C and MREJ NOT DETECTED NOT DETECTED Final    Comment: Performed at [facility], [address] [place], [place] [address]  Resp Panel by RT-PCR (Flu A&B, Covid) Nasopharyngeal Swab     Status: None   Collection Time: 03/20/21  4:01 AM   Specimen: Nasopharyngeal Swab; Nasopharyngeal(NP) swabs in vial transport medium  Result Value Ref Range Status   SARS Coronavirus 2 by RT PCR NEGATIVE NEGATIVE Final    Comment: (NOTE) SARS-CoV-2 target nucleic acids are NOT DETECTED.  The SARS-CoV-2 RNA is generally detectable in upper respiratory specimens during the acute phase of infection. The lowest concentration of SARS-CoV-2 viral copies this assay can detect is 138 copies/mL. A negative result does not preclude SARS-Cov-2 infection and should not be used as the sole basis for treatment or other patient management decisions. A negative result may occur with  improper specimen collection/handling, submission of  specimen other than  nasopharyngeal swab, presence of viral mutation(s) within the areas targeted by this assay, and inadequate number of viral copies(<138 copies/mL). A negative result must be combined with clinical observations, patient history, and epidemiological information. The expected result is Negative.  Fact Sheet for Patients:  EntrepreneurPulse.com.au  Fact Sheet for Healthcare Providers:  IncredibleEmployment.be  This test is no t yet approved or cleared by the [place] FDA and  has been authorized for detection and/or diagnosis of SARS-CoV-2 by FDA under an Emergency Use Authorization (EUA). This EUA will remain  in effect (meaning this test can be used) for the duration of the COVID-19 declaration under Section 564(b)(1) of the Act, 21 U.S.C.section 360bbb-3(b)(1), unless the authorization is terminated  or revoked sooner.       Influenza A by PCR NEGATIVE NEGATIVE Final   Influenza B by PCR NEGATIVE NEGATIVE Final    Comment: (NOTE) The Xpert Xpress SARS-CoV-2/FLU/RSV plus assay is intended as an aid in the diagnosis of influenza from Nasopharyngeal swab specimens and should not be used as a sole basis for treatment. Nasal washings and aspirates are unacceptable for Xpert Xpress SARS-CoV-2/FLU/RSV testing.  Fact Sheet for Patients: EntrepreneurPulse.com.au  Fact Sheet for Healthcare Providers: IncredibleEmployment.be  This test is not yet approved or cleared by the [place] FDA and has been authorized for detection and/or diagnosis of SARS-CoV-2 by FDA under an Emergency Use Authorization (EUA). This EUA will remain in effect (meaning this test can be used) for the duration of the COVID-19 declaration under Section 564(b)(1) of the Act, 21 U.S.C. section 360bbb-3(b)(1), unless the authorization is terminated or revoked.  Performed at [facility], [address] [place], [place]  [address]   Blood culture (routine x 2)     Status: None (Preliminary result)   Collection Time: 03/20/21 11:43 AM   Specimen: BLOOD  Result Value Ref Range Status   Specimen Description BLOOD WRIST  Final   Special Requests   Final    BOTTLES DRAWN AEROBIC AND ANAEROBIC Blood Culture results may not be optimal due to an excessive volume of blood received in culture bottles   Culture   Final    NO GROWTH < 24 HOURS Performed at [facility], [address], [place],  [address]    Report Status PENDING  Incomplete      Imaging Studies   DG Foot Complete Right  Result Date: 03/20/2021 CLINICAL DATA:  80 year old male status post right foot surgery for infection a few days ago. Hypotension, dizziness. EXAM: RIGHT FOOT COMPLETE - 3+ VIEW COMPARISON:  Right foot series 02/13/2021. FINDINGS: Right great toe osteotomy about the IP joint which has been resected. Generalized soft tissue swelling in the right great toe and medial foot. No soft tissue gas. No definite cortical osteolysis along the osteotomy margins. First MTP and other joint spaces appear stable from last month. Calcified peripheral vascular disease. No new osseous abnormality. IMPRESSION: 1. Interval right great toe osteotomy about the IP joint which was resected. No superimposed plain radiographic evidence of osteomyelitis at this time. 2. Generalized soft tissue swelling. Calcified peripheral vascular disease. Electronically Signed   By: Genevie Ann M.D.   On: 03/20/2021 04:45     Medications   Scheduled Meds: . allopurinol  100 mg Oral Daily  . aspirin EC  81 mg Oral Daily  . baclofen  10 mg Oral BID  . cholecalciferol  5,000 Units Oral Daily  . clopidogrel  75 mg Oral  Daily  . docusate sodium  200 mg Oral BID  . gabapentin  100 mg Oral Daily  . insulin aspart  0-5 Units Subcutaneous QHS  . insulin aspart  0-9 Units Subcutaneous TID WC  . insulin glargine  15 Units Subcutaneous QHS  . isosorbide mononitrate  30  mg Oral Daily  . linaclotide  145 mcg Oral Daily  . loratadine  10 mg Oral Daily  . multivitamin with minerals  1 tablet Oral Daily  . pantoprazole  40 mg Oral Daily  . rosuvastatin  40 mg Oral Daily  . sertraline  200 mg Oral Daily  . tamsulosin  0.4 mg Oral Daily   Continuous Infusions: . sodium chloride 10 mL/hr at 03/21/21 1500  .  ceFAZolin (ANCEF) IV         LOS: 1 day    Time spent: 30 minutes    Ezekiel Slocumb, DO [facility]  03/21/2021, 5:06 PM      If 7PM-7AM, please contact night-coverage. How to contact the [facility] Attending or Consulting provider [place] or covering provider during after hours [place], for this patient?    1. Check the care team in [facility] and look for a) attending/consulting [facility] provider listed and b) the [facility] team listed 2. Log into [email or web address] and use 's universal password to access. If you do not have the password, please contact the hospital operator. 3. Locate the [facility] provider you are looking for under [facility] and page to a number that you can be directly reached. 4. If you still have difficulty reaching the provider, please page the [facility] (Director on Call) for the Hospitalists listed on amion for assistance.

## 2021-03-21 NOTE — Progress Notes (Signed)
1 Day Post-Op   Subjective/Chief Complaint: Patient seen.  No complaints of pain.   Objective: Vital signs in last 24 hours: Temp:  [97.8 F (36.6 C)-99.2 F (37.3 C)] 98.6 F (37 C) (04/08 1137) Pulse Rate:  [59-76] 74 (04/08 1137) Resp:  [14-63] 19 (04/08 1137) BP: (113-147)/(51-110) 113/52 (04/08 1137) SpO2:  [97 %-100 %] 97 % (04/08 1137) Last BM Date: 03/20/21  Intake/Output from previous day: 04/07 0701 - 04/08 0700 In: 555.1 [I.V.:155.8; IV Piggyback:399.3] Out: 210 [Urine:200; Blood:10] Intake/Output this shift: Total I/O In: 240 [P.O.:240] Out: -   Bandage on the right foot is dry and intact.  Upon removal there is some mild dried and active bleeding on the bandaging.  No signs of any purulence.  The incision is well coapted.  Mild slightly dusky appearance along the lateral flap.    Lab Results:  Recent Labs    03/20/21 0346 03/21/21 0427  WBC 6.9 6.4  HGB 9.6* 9.0*  HCT 28.0* 27.0*  PLT 95* 84*   BMET Recent Labs    03/20/21 0346 03/21/21 0427  NA 136 136  K 4.4 4.0  CL 110 110  CO2 18* 19*  GLUCOSE 172* 252*  BUN 48* 38*  CREATININE 1.45* 1.45*  CALCIUM 8.7* 8.5*   PT/INR Recent Labs    03/20/21 0346  LABPROT 15.1  INR 1.2   ABG No results for input(s): PHART, HCO3 in the last 72 hours.  Invalid input(s): PCO2, PO2  Studies/Results: DG Foot Complete Right  Result Date: 03/20/2021 CLINICAL DATA:  80 year old male status post right foot surgery for infection a few days ago. Hypotension, dizziness. EXAM: RIGHT FOOT COMPLETE - 3+ VIEW COMPARISON:  Right foot series 02/13/2021. FINDINGS: Right great toe osteotomy about the IP joint which has been resected. Generalized soft tissue swelling in the right great toe and medial foot. No soft tissue gas. No definite cortical osteolysis along the osteotomy margins. First MTP and other joint spaces appear stable from last month. Calcified peripheral vascular disease. No new osseous abnormality.  IMPRESSION: 1. Interval right great toe osteotomy about the IP joint which was resected. No superimposed plain radiographic evidence of osteomyelitis at this time. 2. Generalized soft tissue swelling. Calcified peripheral vascular disease. Electronically Signed   By: Genevie Ann M.D.   On: 03/20/2021 04:45   VAS [place] ABI WITH/WO TBI  Result Date: 03/21/2021 LOWER EXTREMITY DOPPLER STUDY Indications: Peripheral artery disease.  Vascular Interventions: 02/17/21 Right PTA of SFA, DP, and ATA. Performing Technologist: Concha Norway RVT  Examination Guidelines: A complete evaluation includes at minimum, Doppler waveform signals and systolic blood pressure reading at the level of bilateral brachial, anterior tibial, and posterior tibial arteries, when vessel segments are accessible. Bilateral testing is considered an integral part of a complete examination. Photoelectric Plethysmograph (PPG) waveforms and toe systolic pressure readings are included as required and additional duplex testing as needed. Limited examinations for reoccurring indications may be performed as noted.  ABI Findings: +---------+------------------+-----+---------+--------+ Right    Rt Pressure (mmHg)IndexWaveform Comment  +---------+------------------+-----+---------+--------+ Brachial 159                                      +---------+------------------+-----+---------+--------+ ATA      187               1.18 biphasic          +---------+------------------+-----+---------+--------+ PTA  185               1.16 triphasic         +---------+------------------+-----+---------+--------+ Great Toe146               0.92 Normal            +---------+------------------+-----+---------+--------+ +---------+------------------+-----+--------+-------+ Left     Lt Pressure (mmHg)IndexWaveformComment +---------+------------------+-----+--------+-------+ ATA      201               1.26 biphasic         +---------+------------------+-----+--------+-------+ PTA      196               1.23 biphasic        +---------+------------------+-----+--------+-------+ Great Toe143               0.90 Abnormal        +---------+------------------+-----+--------+-------+ +-------+-----------+-----------+------------+------------+ ABI/TBIToday's ABIToday's TBIPrevious ABIPrevious TBI +-------+-----------+-----------+------------+------------+ Right  1.18       .92        1.08        .73          +-------+-----------+-----------+------------+------------+ Left   1.26       .90        [place]          .81          +-------+-----------+-----------+------------+------------+  Summary: Right: Resting right ankle-brachial index is within normal range. No evidence of significant right lower extremity arterial disease. The right toe-brachial index is normal. Left: Resting left ankle-brachial index is within normal range. No evidence of significant left lower extremity arterial disease. The left toe-brachial index is normal.  *See table(s) above for measurements and observations.  Electronically signed by Leotis Pain MD on 03/21/2021 at 10:09:19 AM.   Final     Anti-infectives: Anti-infectives (From admission, onward)   Start     Dose/Rate Route Frequency Ordered Stop   03/21/21 2200  ceFAZolin (ANCEF) IVPB 2g/100 mL premix        2 g 200 mL/hr over 30 Minutes Intravenous Every 8 hours 03/21/21 1130     03/21/21 0800  vancomycin (VANCOREADY) IVPB 1000 mg/200 mL  Status:  Discontinued        1,000 mg 200 mL/hr over 60 Minutes Intravenous Every 24 hours 03/20/21 1515 03/21/21 1130   03/20/21 0800  cefTRIAXone (ROCEPHIN) 2 g in sodium chloride 0.9 % 100 mL IVPB  Status:  Discontinued        2 g 200 mL/hr over 30 Minutes Intravenous Every 24 hours 03/20/21 0727 03/21/21 1130   03/20/21 0800  metroNIDAZOLE (FLAGYL) IVPB 500 mg  Status:  Discontinued        500 mg 100 mL/hr over 60 Minutes Intravenous Every  8 hours 03/20/21 0727 03/21/21 1130   03/20/21 0800  vancomycin (VANCOCIN) IVPB 1000 mg/200 mL premix  Status:  Discontinued        1,000 mg 200 mL/hr over 60 Minutes Intravenous Every 24 hours 03/20/21 0732 03/20/21 1515   03/20/21 0400  piperacillin-tazobactam (ZOSYN) IVPB 3.375 g        3.375 g 100 mL/hr over 30 Minutes Intravenous  Once 03/20/21 0353 03/20/21 0444   03/20/21 0400  vancomycin (VANCOCIN) IVPB 1000 mg/200 mL premix        1,000 mg 200 mL/hr over 60 Minutes Intravenous  Once 03/20/21 0353 03/20/21 0552      Assessment/Plan: s/p Procedure(s): AMPUTATION TOE (Right) Assessment: Stable status post amputation right  hallux.   Plan: Betadine and a sterile bandage applied to the right foot.  Plan for dressing change most likely on Sunday to reevaluate the incision area.  If any problems with the incision we may need to consult vascular but at this point we will hold off over the weekend.  Reevaluate in a couple of days.  LOS: 1 day    Durward Fortes 03/21/2021

## 2021-03-21 NOTE — Progress Notes (Signed)
PT Cancellation Note  Patient Details Name: Nathan Singleton MRN: [id number] DOB: December 28, 1940   Cancelled Treatment:    Reason Eval/Treat Not Completed: Active bedrest order.  Per chart review pt on 'Complete Bedrest'.  Spoke to attending physician who agreed with completing rehab orders at this time and will assess pt once medically appropriate upon receipt of new PT orders.    [name] PT, DPT 03/21/21, 10:27 AM

## 2021-03-21 NOTE — Progress Notes (Signed)
OT Cancellation Note  Patient Details Name: Tawan Tabet MRN: [id number] DOB: Jun 27, 1941   Cancelled Treatment:    Reason Eval/Treat Not Completed: Other (comment)  Pt s/p sx for R great toe amputation on 4/7. Will require new orders including WB precautions to resume OT services. Thank you.  Gerrianne Scale, [place], OTR/L ascom [phone] 03/21/21, 10:28 AM

## 2021-03-21 NOTE — Evaluation (Signed)
Physical Therapy Evaluation Patient Details Name: Nathan Singleton MRN: [id number] DOB: November 25, 1941 Today's Date: 03/21/2021   History of Present Illness  Nathan Singleton is a 34yoM who comes to [facility] on 03/20/21 c dizziness and worsening Right foot pain- pt admitted with cellulitis of the right hallux.  Pt is s/p surgical debridement, arhtroplasty c podiaty 3/8. Pt taken back to OR with podiatry on 4/7 for hallux amputation. Patient orders for AMB in surgical shoe, heel weight bearing. PMH: poorly controlled DM2, aortic stenosis s/p TAVR, SSS s/p PM, HTN, chronic anemia. 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 No prior falls history.  Clinical Impression  Pt admitted with above diagnosis. Pt currently with functional limitations due to the deficits listed below (see "PT Problem List"). Upon entry, pt in bed, awake and agreeable to participate-significant other at bedside. Pt is familiar to author from prior admission. The pt is alert, pleasant, interactive. PLOF established prior admission. 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 MinA to EOB, minA to rise to standing with RW, no LOB in stance. Pt AMB 43f into hallway and back, good control of body, observes precautions, tolerated well. Pt standing in room momentarily while recliner is set up. Patient's performance this date reveals decreased ability, independence, and tolerance in performing all basic mobility required for performance of activities of daily living. Pt requires additional DME, close physical assistance, and cues for safe participate in mobility. Pt will benefit from skilled PT intervention to increase independence and safety with basic mobility in preparation for discharge to the venue listed below.       Follow Up Recommendations Home health PT    Equipment Recommendations  None recommended by PT    Recommendations for Other Services       Precautions /  Restrictions Precautions Precautions: ICD/Pacemaker;Fall Other Brace: postopshoe Restrictions Weight Bearing Restrictions: Yes RLE Weight Bearing: Partial weight bearing RLE Partial Weight Bearing Percentage or Pounds: heel weight bearing in postop shoe      Mobility  Bed Mobility Overal bed mobility: Needs Assistance Bed Mobility: Supine to Sit     Supine to sit: Min assist;Min guard          Transfers Overall transfer level: Needs assistance Equipment used: Rolling walker (2 wheeled) Transfers: Sit to/from Stand Sit to Stand: Min assist         General transfer comment: multiple attempts to rise unsuccessful due to tall stature, can rise with MinA of trunk  Ambulation/Gait Ambulation/Gait assistance: Min guard;Supervision Gait Distance (Feet): 30 Feet Assistive device: Rolling walker (2 wheeled)       General Gait Details: postop shoe, pt verbalizes efforts to heel weight bear. No frank LOB. Distance limited by postoperatve restrictions.  Stairs            Wheelchair Mobility    Modified Rankin (Stroke Patients Only)       Balance                                             Pertinent Vitals/Pain Pain Assessment: Faces Faces Pain Scale: Hurts little more Pain Location: Right foot surgical site Pain Intervention(s): Limited activity within patient's tolerance;Premedicated before session;Monitored during session    [place] to be discharged to:: Private residence Living Arrangements: Spouse/significant other Available Help at Discharge: Available 24  hours/day Type of Home: House Home Access: Stairs to enter   [employer] of Steps: 1 partial step Home Layout: One level Home Equipment: Walker - 2 wheels      Prior Function Level of Independence: Independent         Comments: Pt. has stopped driving since recent hospitalization.     Hand Dominance   Dominant Hand: Right     Extremity/Trunk Assessment                Communication   Communication: HOH (choclear implant battery issues)  Cognition Arousal/Alertness: Awake/alert Behavior During Therapy: WFL for tasks assessed/performed Overall Cognitive Status: Within Functional Limits for tasks assessed                                        General Comments      Exercises     Assessment/Plan    PT Assessment Patient needs continued PT services  PT Problem List Decreased strength;Decreased range of motion;Decreased activity tolerance;Decreased balance;Decreased mobility       PT Treatment Interventions DME instruction;Gait training;Therapeutic exercise;Patient/family education;Functional mobility training    PT Goals (Current goals can be found in the Care Plan section)  Acute Rehab PT Goals Patient Stated Goal: remain independent with moblity PT Goal Formulation: With patient Time For Goal Achievement: 04/04/21 Potential to Achieve Goals: Good    Frequency 7X/week   Barriers to discharge        Co-evaluation               AM-PAC PT "6 Clicks" Mobility  Outcome Measure Help needed turning from your back to your side while in a flat bed without using bedrails?: A Lot Help needed moving from lying on your back to sitting on the side of a flat bed without using bedrails?: A Lot Help needed moving to and from a bed to a chair (including a wheelchair)?: A Lot Help needed standing up from a chair using your arms (e.g., wheelchair or bedside chair)?: A Lot Help needed to walk in hospital room?: A Little Help needed climbing 3-5 steps with a railing? : A Little 6 Click Score: 14    End of Session Equipment Utilized During Treatment: Gait belt Activity Tolerance: Patient tolerated treatment well;No increased pain Patient left: in chair;with family/visitor present;with call bell/phone within reach Nurse Communication: Mobility status PT Visit Diagnosis: Other  abnormalities of gait and mobility (R26.89);Difficulty in walking, not elsewhere classified (R26.2)    Time: [id number] PT Time Calculation (min) (ACUTE ONLY): 26 min   Charges:   PT Evaluation $PT Eval Moderate Complexity: 1 Mod          7:58 PM, 03/21/21 Etta Grandchild, PT, DPT Physical Therapist - [facility]  [phone] ([place])    Ramirez-Perez C 03/21/2021, 7:55 PM

## 2021-03-22 DIAGNOSIS — L03031 Cellulitis of right toe: Secondary | ICD-10-CM | POA: Diagnosis not present

## 2021-03-22 DIAGNOSIS — I2510 Atherosclerotic heart disease of native coronary artery without angina pectoris: Secondary | ICD-10-CM | POA: Diagnosis not present

## 2021-03-22 DIAGNOSIS — I10 Essential (primary) hypertension: Secondary | ICD-10-CM | POA: Diagnosis not present

## 2021-03-22 DIAGNOSIS — I739 Peripheral vascular disease, unspecified: Secondary | ICD-10-CM | POA: Diagnosis not present

## 2021-03-22 LAB — CBC
HCT: 24.6 % — ABNORMAL LOW (ref 39.0–52.0)
Hemoglobin: 8.2 g/dL — ABNORMAL LOW (ref 13.0–17.0)
MCH: 30.9 pg (ref 26.0–34.0)
MCHC: 33.3 g/dL (ref 30.0–36.0)
MCV: 92.8 fL (ref 80.0–100.0)
Platelets: 84 10*3/uL — ABNORMAL LOW (ref 150–400)
RBC: 2.65 MIL/uL — ABNORMAL LOW (ref 4.22–5.81)
RDW: 13.9 % (ref 11.5–15.5)
WBC: 5.1 10*3/uL (ref 4.0–10.5)
nRBC: 0 % (ref 0.0–0.2)

## 2021-03-22 LAB — BASIC METABOLIC PANEL
Anion gap: 7 (ref 5–15)
BUN: 41 mg/dL — ABNORMAL HIGH (ref 8–23)
CO2: 23 mmol/L (ref 22–32)
Calcium: 8.2 mg/dL — ABNORMAL LOW (ref 8.9–10.3)
Chloride: 104 mmol/L (ref 98–111)
Creatinine, Ser: 1.49 mg/dL — ABNORMAL HIGH (ref 0.61–1.24)
GFR, Estimated: 48 mL/min — ABNORMAL LOW (ref >60)
Glucose, Bld: 262 mg/dL — ABNORMAL HIGH (ref 70–99)
Potassium: 4.2 mmol/L (ref 3.5–5.1)
Sodium: 134 mmol/L — ABNORMAL LOW (ref 135–145)

## 2021-03-22 LAB — GLUCOSE, CAPILLARY
Glucose-Capillary: 352 mg/dL — ABNORMAL HIGH (ref 70–99)
Glucose-Capillary: 325 mg/dL — ABNORMAL HIGH (ref 70–99)
Glucose-Capillary: 124 mg/dL — ABNORMAL HIGH (ref 70–99)
Glucose-Capillary: 154 mg/dL — ABNORMAL HIGH (ref 70–99)

## 2021-03-22 LAB — PROCALCITONIN: Procalcitonin: 0.49 ng/mL

## 2021-03-22 MED ORDER — INSULIN GLARGINE 100 UNIT/ML ~~LOC~~ SOLN
25.0000 [IU] | Freq: Every day | SUBCUTANEOUS | Status: DC
  Administered 2021-03-22 – 2021-03-25 (×3): 25 [IU] via SUBCUTANEOUS
  Filled 2021-03-22 (×4): qty 0.25

## 2021-03-22 MED ORDER — INSULIN ASPART 100 UNIT/ML ~~LOC~~ SOLN
10.0000 [IU] | Freq: Three times a day (TID) | SUBCUTANEOUS | Status: DC
  Administered 2021-03-23 – 2021-03-28 (×9): 10 [IU] via SUBCUTANEOUS
  Filled 2021-03-22 (×9): qty 1

## 2021-03-22 NOTE — Progress Notes (Addendum)
PROGRESS NOTE    Nathan Singleton   [id number]  DOB: 1940/12/22  PCP: Ranae Plumber, PA    DOA: 03/20/2021 LOS: 2   Brief Narrative   Nathan Singleton is a 80 y.o. male with medical history significant of PVD, HTN, HLD, DM, gout, s/p of TAVR with bioprosthesis, pacemaker placement, CAD, CKD-3a, BPH, thrombocytopenia, anemia, chronic pain, who presented to the ED on 03/20/21 with dizziness and worsening right foot pain in addition to swelling and progressive erythema.  Due to dizziness, pt checked BP which was 92/55.  EMS was called.   Pt hospitalized 3/3 to 3/11 due to infected diabetic foot ulcer and PVD. Underwent I&D with biopsy on 02/18/2021.  Biopsy did not show osteomyelitis. Treated with IV Vancomycin, Rocephin and Flagyl and discharged on Keflex for 10 days. Vascular surgery was consulted for PVD. Pt underwent lower extremity angiogram and angioplasty of the SFA and percutaneous transluminal angioplasty of the right dorsalis podalic artery and distal anterior tibial artery. Started on aspirin and Plavix.  Xray of Right Foot in the ED showed:  1. Interval right great toe osteotomy about the IP joint which was resected. No superimposed plain radiographic evidence of osteomyelitis at this time. 2. Generalized soft tissue swelling. Calcified peripheral vascular disease.   Admitted to hospitalist service.  Dr. Cleda Mccreedy of podiatry consulted.  Patient underwent amputation of the Right great toe on evening of 03/20/21.  On IV antibiotics with surgical culture / path pending. Infectious disease is following.    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 --Podiatry, Infectious Disease following --Antibiotics: Vanc/Flagyl,/Rocephin >> Ancef --Follow blood and operative cultures/path --Off IV fluids --Pain control as needed --Bowel regimen --PT evaluation  Dizziness -most likely due to soft BP, dehydration.  No focal neurologic findings on exam are reported by patient. --Improved with IV hydration and improved BP --Orthostatics --PT and OT eval's  MSSA Bacteremia- one of four bottles with +MSSA, ?contaminant vs hematogenous spread of foot infection. --ID following --Repeat blood culture ordered, follow --TTE, if negative, will need TEE due to bioprosthetic valve and pacemaker in place, higher risk for endocarditis).   --PICC line once repeat cultures negative x 48 hours  Essential hypertension -antihypertensives held on admission due to soft BP.  Monitor BP and resume meds when indicated.  Insulin-dependent type 2 diabetes -Home regimen is Lantus 25 units and Victoza.  Initially placed on 15 units Lantus and sliding scale.   CBG's uncontrolled. --Increase Lantus to home dose 25 units qHS --Increase NovoLog to 10  units 3 TID WC --Continue sliding scale NovoLog --Hold Victoza  Hyperglycemia - CBG's in 200's today, insulin adjusted as above.  CKD stage IIIa -renal function stable.  Monitor BMP.  Peripheral vascular disease -with recent right lower extremity angioplasty as above. 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  Higher risk for endocarditis with bacteremia.  Echo pending.  S/p TAVR with Bioprosthetic Aortic Valve - Higher risk for endocarditis with bacteremia.   Echo as above.  Thrombocytopenia -chronic, stable.  No signs of  bleeding. --Follow CBC --Monitor for signs of bleeding  Normocytic anemia -with hemoglobin stable 9's --Monitor CBC  COPD -stable without exacerbation symptoms at this time --Albuterol as needed  Depression -continue Zoloft  GERD without esophagitis -continue PPI  Gout -continue allopurinol  Hyperlipidemia -continue Crestor  Obesity: Body mass index is 31.46 kg/m.  Complicates overall care and prognosis.  Recommend lifestyle modifications including physical activity and diet for weight loss and overall long-term health.   DVT prophylaxis: SCDs Start: 03/20/21 0730   Diet:  Diet Orders (From admission, onward)    Start     Ordered   03/20/21 2123  Diet Carb Modified Fluid consistency: Thin; Room service appropriate? Yes  Diet effective now       Question Answer Comment  Calorie Level Medium 1600-2000   Fluid consistency: Thin   Room service appropriate? Yes      03/20/21 2122            Code Status: Full Code    Subjective 03/22/21    Patient seen up in chair this AM. Reports feeling "lousy" today.  Has a headache and says he just feel tired, run down.  Having some foot pain as well.   Disposition Plan & Communication   Status is: Inpatient  Inpatient status appropriate due to severity of illness requiring IV therapies and ongoing evaluation awaiting surgical cultures and pathology  Dispo: The patient is from: Home              Anticipated d/c is to: Home              Patient currently not medically stable for discharge   Difficult to place patient no   Consults, Procedures, Significant Events   Consultants:   Podiatry  Infectious disease  Procedures:   Right great toe amputation on 03/20/2021  Antimicrobials:  Anti-infectives (From admission, onward)   Start     Dose/Rate Route Frequency Ordered Stop   03/21/21 2200  ceFAZolin (ANCEF) IVPB 2g/100 mL premix        2 g 200 mL/hr over 30 Minutes Intravenous Every 8 hours 03/21/21 1130      03/21/21 0800  vancomycin (VANCOREADY) IVPB 1000 mg/200 mL  Status:  Discontinued        1,000 mg 200 mL/hr over 60 Minutes Intravenous Every 24 hours 03/20/21 1515 03/21/21 1130   03/20/21 0800  cefTRIAXone (ROCEPHIN) 2 g in sodium chloride 0.9 % 100 mL IVPB  Status:  Discontinued        2 g 200 mL/hr over 30 Minutes Intravenous Every 24 hours 03/20/21 0727 03/21/21 1130   03/20/21 0800  metroNIDAZOLE (FLAGYL) IVPB 500 mg  Status:  Discontinued        500 mg 100 mL/hr over 60 Minutes Intravenous Every 8 hours 03/20/21 0727 03/21/21 1130   03/20/21 0800  vancomycin (VANCOCIN) IVPB 1000 mg/200 mL premix  Status:  Discontinued        1,000 mg 200 mL/hr over 60 Minutes Intravenous Every 24 hours 03/20/21 0732 03/20/21 1515   03/20/21 0400  piperacillin-tazobactam (ZOSYN) IVPB 3.375 g        3.375 g 100 mL/hr over 30 Minutes Intravenous  Once 03/20/21 0353 03/20/21 0444   03/20/21 0400  vancomycin (VANCOCIN) IVPB 1000 mg/200 mL premix  1,000 mg 200 mL/hr over 60 Minutes Intravenous  Once 03/20/21 0353 03/20/21 0552        Micro    Objective   Vitals:   03/22/21 0927 03/22/21 1352 03/22/21 1403 03/22/21 1552  BP: 137/70 137/70 137/70 (!) 102/51  Pulse: 71   60  Resp:    16  Temp:    97.6 F (36.4 C)  TempSrc:      SpO2: 99%   93%  Weight:      Height:        Intake/Output Summary (Last 24 hours) at 03/22/2021 1650 Last data filed at 03/22/2021 1045 Gross per 24 hour  Intake 717 ml  Output --  Net 717 ml   Filed Weights   03/20/21 0332  Weight: 105.2 kg    Physical Exam:  General exam: awake, alert, no acute distress Respiratory system: CTAB, normal respiratory effort, on room air. Cardiovascular system: normal S1/S2, RRR, trace lower extremity edema bilaterally.   Gastrointestinal system: soft, NT, ND, +bowel sounds. Extremities: Right foot Ace bandage intact, boot in place, normal tone   Labs   Data Reviewed: I have personally reviewed following labs and  imaging studies  CBC: Recent Labs  Lab 03/20/21 0346 03/21/21 0427 03/22/21 0451  WBC 6.9 6.4 5.1  NEUTROABS 5.5  --   --   HGB 9.6* 9.0* 8.2*  HCT 28.0* 27.0* 24.6*  MCV 91.5 91.2 92.8  PLT 95* 84* 84*   Basic Metabolic Panel: Recent Labs  Lab 03/20/21 0346 03/21/21 0427 03/22/21 0451  NA 136 136 134*  K 4.4 4.0 4.2  CL 110 110 104  CO2 18* 19* 23  GLUCOSE 172* 252* 262*  BUN 48* 38* 41*  CREATININE 1.45* 1.45* 1.49*  CALCIUM 8.7* 8.5* 8.2*   GFR: Estimated Creatinine Clearance: 51.2 mL/min (A) (by C-G formula based on SCr of 1.49 mg/dL (H)). Liver Function Tests: Recent Labs  Lab 03/20/21 0346  AST 37  ALT 38  ALKPHOS 95  BILITOT 0.6  PROT 7.0  ALBUMIN 3.8   No results for input(s): LIPASE, AMYLASE in the last 168 hours. No results for input(s): AMMONIA in the last 168 hours. Coagulation Profile: Recent Labs  Lab 03/20/21 0346  INR 1.2   Cardiac Enzymes: No results for input(s): CKTOTAL, CKMB, CKMBINDEX, TROPONINI in the last 168 hours. BNP (last 3 results) No results for input(s): PROBNP in the last 8760 hours. HbA1C: No results for input(s): HGBA1C in the last 72 hours. CBG: Recent Labs  Lab 03/21/21 1132 03/21/21 1623 03/21/21 2124 03/22/21 0907 03/22/21 1156  GLUCAP 299* 229* 203* 352* 325*   Lipid Profile: No results for input(s): CHOL, HDL, LDLCALC, TRIG, CHOLHDL, LDLDIRECT in the last 72 hours. Thyroid Function Tests: No results for input(s): TSH, T4TOTAL, FREET4, T3FREE, THYROIDAB in the last 72 hours. Anemia Panel: No results for input(s): VITAMINB12, FOLATE, FERRITIN, TIBC, IRON, RETICCTPCT in the last 72 hours. Sepsis Labs: Recent Labs  Lab 03/20/21 0346 03/20/21 0352 03/21/21 0427 03/22/21 0451  PROCALCITON 0.61  --  0.58 0.49  LATICACIDVEN  --  0.9  --   --     Recent Results (from the past 240 hour(s))  Blood culture (routine x 2)     Status: Abnormal (Preliminary result)   Collection Time: 03/20/21  3:52 AM    Specimen: BLOOD LEFT FOREARM  Result Value Ref Range Status   Specimen Description   Final    BLOOD LEFT FOREARM Performed at [facility],  [address] [place], [place] [address]    Special Requests   Final    BOTTLES DRAWN AEROBIC AND ANAEROBIC Blood Culture adequate volume Performed at [facility], [address] [place], [place] [address]    Culture  Setup Time   Final    AEROBIC BOTTLE ONLY GRAM POSITIVE COCCI Organism ID to follow CRITICAL RESULT CALLED TO, READ BACK BY AND VERIFIED WITHLockie Mola [id number] 03/20/21 HNM Performed at [facility], [address] [place], [place] [address]    Culture (A)  Final    STAPHYLOCOCCUS AUREUS SUSCEPTIBILITIES TO FOLLOW Performed at [facility], [place] [address] [place], [place] [address]    Report Status PENDING  Incomplete  Blood Culture ID Panel (Reflexed)     Status: Abnormal   Collection Time: 03/20/21  3:52 AM  Result Value Ref Range Status   Enterococcus faecalis NOT DETECTED NOT DETECTED Final   Enterococcus Faecium NOT DETECTED NOT DETECTED Final   Listeria monocytogenes NOT DETECTED NOT DETECTED Final   Staphylococcus species DETECTED (A) NOT DETECTED Final    Comment: CRITICAL RESULT CALLED TO, READ BACK BY AND VERIFIED WITH: ALEX CHAPPELL PHARMD [id number] 03/20/21 HNM    Staphylococcus aureus (BCID) DETECTED (A) NOT DETECTED Final    Comment: CRITICAL RESULT CALLED TO, READ BACK BY AND VERIFIED WITH: [place] [id number] 03/20/21 HNM    Staphylococcus epidermidis NOT DETECTED NOT DETECTED Final   Staphylococcus lugdunensis NOT DETECTED NOT DETECTED Final   Streptococcus species NOT DETECTED NOT DETECTED Final   Streptococcus agalactiae NOT DETECTED NOT DETECTED Final   Streptococcus pneumoniae NOT DETECTED NOT DETECTED Final   Streptococcus pyogenes NOT DETECTED NOT DETECTED Final   A.calcoaceticus-baumannii NOT DETECTED NOT DETECTED Final   Bacteroides fragilis NOT DETECTED  NOT DETECTED Final   Enterobacterales NOT DETECTED NOT DETECTED Final   Enterobacter cloacae complex NOT DETECTED NOT DETECTED Final   Escherichia coli NOT DETECTED NOT DETECTED Final   Klebsiella aerogenes NOT DETECTED NOT DETECTED Final   Klebsiella oxytoca NOT DETECTED NOT DETECTED Final   Klebsiella pneumoniae NOT DETECTED NOT DETECTED Final   Proteus species NOT DETECTED NOT DETECTED Final   Salmonella species NOT DETECTED NOT DETECTED Final   Serratia marcescens NOT DETECTED NOT DETECTED Final   Haemophilus influenzae NOT DETECTED NOT DETECTED Final   Neisseria meningitidis NOT DETECTED NOT DETECTED Final   Pseudomonas aeruginosa NOT DETECTED NOT DETECTED Final   Stenotrophomonas maltophilia NOT DETECTED NOT DETECTED Final   Candida albicans NOT DETECTED NOT DETECTED Final   Candida auris NOT DETECTED NOT DETECTED Final   Candida glabrata NOT DETECTED NOT DETECTED Final   Candida krusei NOT DETECTED NOT DETECTED Final   Candida parapsilosis NOT DETECTED NOT DETECTED Final   Candida tropicalis NOT DETECTED NOT DETECTED Final   Cryptococcus neoformans/gattii NOT DETECTED NOT DETECTED Final   Meth resistant mecA/C and MREJ NOT DETECTED NOT DETECTED Final    Comment: Performed at [facility], [address] [place], [place] [address]  Resp Panel by RT-PCR (Flu A&B, Covid) Nasopharyngeal Swab     Status: None   Collection Time: 03/20/21  4:01 AM   Specimen: Nasopharyngeal Swab; Nasopharyngeal(NP) swabs in vial transport medium  Result Value Ref Range Status   SARS Coronavirus 2 by RT PCR NEGATIVE NEGATIVE Final    Comment: (NOTE) SARS-CoV-2 target nucleic acids are NOT DETECTED.  The SARS-CoV-2 RNA is generally detectable in upper respiratory specimens during the acute phase of  infection. The lowest concentration of SARS-CoV-2 viral copies this assay can detect is 138 copies/mL. A negative result does not preclude SARS-Cov-2 infection and should not be used as the sole  basis for treatment or other patient management decisions. A negative result may occur with  improper specimen collection/handling, submission of specimen other than nasopharyngeal swab, presence of viral mutation(s) within the areas targeted by this assay, and inadequate number of viral copies(<138 copies/mL). A negative result must be combined with clinical observations, patient history, and epidemiological information. The expected result is Negative.  Fact Sheet for Patients:  EntrepreneurPulse.com.au  Fact Sheet for Healthcare Providers:  IncredibleEmployment.be  This test is no t yet approved or cleared by the [place] FDA and  has been authorized for detection and/or diagnosis of SARS-CoV-2 by FDA under an Emergency Use Authorization (EUA). This EUA will remain  in effect (meaning this test can be used) for the duration of the COVID-19 declaration under Section 564(b)(1) of the Act, 21 U.S.C.section 360bbb-3(b)(1), unless the authorization is terminated  or revoked sooner.       Influenza A by PCR NEGATIVE NEGATIVE Final   Influenza B by PCR NEGATIVE NEGATIVE Final    Comment: (NOTE) The Xpert Xpress SARS-CoV-2/FLU/RSV plus assay is intended as an aid in the diagnosis of influenza from Nasopharyngeal swab specimens and should not be used as a sole basis for treatment. Nasal washings and aspirates are unacceptable for Xpert Xpress SARS-CoV-2/FLU/RSV testing.  Fact Sheet for Patients: EntrepreneurPulse.com.au  Fact Sheet for Healthcare Providers: IncredibleEmployment.be  This test is not yet approved or cleared by the [place] FDA and has been authorized for detection and/or diagnosis of SARS-CoV-2 by FDA under an Emergency Use Authorization (EUA). This EUA will remain in effect (meaning this test can be used) for the duration of the COVID-19 declaration under Section 564(b)(1) of the Act,  21 U.S.C. section 360bbb-3(b)(1), unless the authorization is terminated or revoked.  Performed at [facility], [address] [place], [place] [address]   Blood culture (routine x 2)     Status: None (Preliminary result)   Collection Time: 03/20/21 11:43 AM   Specimen: BLOOD  Result Value Ref Range Status   Specimen Description BLOOD WRIST  Final   Special Requests   Final    BOTTLES DRAWN AEROBIC AND ANAEROBIC Blood Culture results may not be optimal due to an excessive volume of blood received in culture bottles   Culture   Final    NO GROWTH 2 DAYS Performed at [facility], [address], [place], [place] [address]    Report Status PENDING  Incomplete  Aerobic/Anaerobic Culture w Gram Stain (surgical/deep wound)     Status: None (Preliminary result)   Collection Time: 03/20/21  8:41 PM   Specimen: PATH Other; Tissue  Result Value Ref Range Status   Specimen Description   Final    TISSUE Performed at [facility], [address], [place], [place] [address]    Special Requests   Final    RIGHT GREAT TOE Performed at [facility], [address] [place], [place] [address]    Gram Stain   Final    ABUNDANT WBC PRESENT,BOTH PMN AND MONONUCLEAR FEW GRAM POSITIVE COCCI Performed at [facility], [place] [address], [place],  [address]    Culture ABUNDANT STAPHYLOCOCCUS AUREUS  Final   Report Status PENDING  Incomplete      Imaging Studies   No results found.  Medications   Scheduled Meds: . allopurinol  100 mg Oral Daily  . aspirin EC  81 mg Oral Daily  . baclofen  10 mg Oral BID  . cholecalciferol  5,000 Units Oral Daily  . clopidogrel  75 mg Oral Daily  . docusate sodium  200 mg Oral BID  . gabapentin  100 mg Oral Daily  . insulin aspart  0-5 Units Subcutaneous QHS  . insulin aspart  0-9 Units Subcutaneous TID WC  . insulin aspart  10 Units Subcutaneous TID WC  . insulin glargine  25 Units Subcutaneous  QHS  . isosorbide mononitrate  30 mg Oral Daily  . linaclotide  145 mcg Oral Daily  . loratadine  10 mg Oral Daily  . multivitamin with minerals  1 tablet Oral Daily  . pantoprazole  40 mg Oral Daily  . rosuvastatin  40 mg Oral Daily  . sertraline  200 mg Oral Daily  . tamsulosin  0.4 mg Oral Daily   Continuous Infusions: . sodium chloride 10 mL/hr at 03/22/21 0534  .  ceFAZolin (ANCEF) IV 2 g (03/22/21 1401)       LOS: 2 days    Time spent: 20 minutes    Ezekiel Slocumb, DO [facility]  03/22/2021, 4:50 PM      If 7PM-7AM, please contact night-coverage. How to contact the [facility] Attending or Consulting provider [place] or covering provider during after hours [place], for this patient?    1. Check the care team in [facility] and look for a) attending/consulting TR[facility] provider listed and b) the [facility] team listed 2. Log into [email or web address] and use [place]'s universal password to access. If you do not have the password, please contact the hospital operator. 3. Locate the [facility] provider you are looking for under [facility] and page to a number that you can be directly reached. 4. If you still have difficulty reaching the provider, please page the [facility] (Director on Call) for the Hospitalists listed on amion for assistance.

## 2021-03-22 NOTE — Progress Notes (Signed)
Physical Therapy Treatment Patient Details Name: Nathan Singleton MRN: [id number] DOB: June 29, 1941 Today's Date: 03/22/2021    History of Present Illness Nathan Singleton is a 42yoM who comes to [facility] on 03/20/21 c dizziness and worsening Right foot pain- pt admitted with cellulitis of the right hallux.  Pt is s/p surgical debridement, arhtroplasty c podiaty 3/8. Pt taken back to OR with podiatry on 4/7 for hallux amputation. Patient orders for AMB in surgical shoe, heel weight bearing. PMH: poorly controlled DM2, aortic stenosis s/p TAVR, SSS s/p PM, HTN, chronic anemia. 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 No prior falls history.    PT Comments    Pt seated in recliner receiving his medications from there RN upon PT arrival. Pt willing to participate in PT interventions today. Pt completed the following therapeutic exercises: ankle pumps x20 BIL, pillow squeezes x20, SLRs BIL x15 each and LAQs x15 each. Pt provided verbal cues for pace, form and pursed lip breathing. Pt reported increased pain in his R foot in the dependent position - educated pt on elevating the foot to decrease swelling and improve pain. Completed ambulation in room with RW and minA from therapist. Reviewed weight bearing precautions with patient, pt with appropriate teach back and demonstration of WB precautions. He ambulated 64ft within room, then requested to sit and finish PT interventions due to increased pain in the R foot. Pt returned to recliner with RLE elevated to decrease pain, reviewed PLB for pain maintenance. Pt left seated in recliner with call bell, personal cell phone and all needs met.   Follow Up Recommendations  Home health PT     Equipment Recommendations  None recommended by PT    Recommendations for Other Services       Precautions / Restrictions Precautions Precautions: ICD/Pacemaker;Fall Required Braces or Orthoses:  (Surgical shoe) Other Brace: post-op  shoe Restrictions Weight Bearing Restrictions: Yes RLE Weight Bearing: Partial weight bearing RLE Partial Weight Bearing Percentage or Pounds: Heel WB on RLE with surgical shoe    Mobility  Bed Mobility               General bed mobility comments: Pt seated in recliner upon PT arrival    Transfers Overall transfer level: Needs assistance Equipment used: Rolling walker (2 wheeled) Transfers: Sit to/from Stand Sit to Stand: Min assist         General transfer comment: Pt requires BIL UE to complete sit<>stand transfer with MinA from therapist  Ambulation/Gait Ambulation/Gait assistance: Min guard;Supervision Gait Distance (Feet): 40 Feet Assistive device: Rolling walker (2 wheeled) Gait Pattern/deviations: Step-to pattern     General Gait Details: Wearing post-op shoe, reviewed WB precautions - pt verbalized and demonstrated understanding of PWB precautions. Pt's ambulation was limited today by pt's pain tolerance.   Stairs             Wheelchair Mobility    Modified Rankin (Stroke Patients Only)       Balance Overall balance assessment: Needs assistance Sitting-balance support: Bilateral upper extremity supported Sitting balance-Leahy Scale: Normal Sitting balance - Comments: Pt safe seated on the edge of his recliner wihtout back support     Standing balance-Leahy Scale: Fair Standing balance comment: Decreased stability in the standing position 2/2 toe amputation and subsequent change in LE positioning/WB                            Cognition Arousal/Alertness:  Awake/alert Behavior During Therapy: WFL for tasks assessed/performed Overall Cognitive Status: Within Functional Limits for tasks assessed                                 General Comments: Pt alert and able to follow all commands      Exercises Total Joint Exercises Ankle Circles/Pumps: AROM;Both;20 reps Towel Squeeze: AROM;Both;10 reps (With 5 second  hold) Straight Leg Raises: AROM;Both;15 reps Long Arc Quad: AROM;Both;15 reps    General Comments        Pertinent Vitals/Pain Pain Score: 8  Pain Location: Right foot surgical site Pain Descriptors / Indicators: Aching;Throbbing Pain Intervention(s): Limited activity within patient's tolerance    Home Living Family/patient expects to be discharged to:: Private residence Living Arrangements: Spouse/significant other Available Help at Discharge: Available 24 hours/day Type of Home: House Home Access: Stairs to enter   Home Layout: One level Home Equipment: Environmental consultant - 2 wheels      Prior Function Level of Independence: Independent          PT Goals (current goals can now be found in the care plan section) Acute Rehab PT Goals Patient Stated Goal: remain independent with moblity PT Goal Formulation: With patient Time For Goal Achievement: 04/04/21 Potential to Achieve Goals: Good Progress towards PT goals: Progressing toward goals    Frequency    7X/week      PT Plan      Co-evaluation              AM-PAC PT "6 Clicks" Mobility   Outcome Measure  Help needed turning from your back to your side while in a flat bed without using bedrails?: A Lot Help needed moving from lying on your back to sitting on the side of a flat bed without using bedrails?: A Lot Help needed moving to and from a bed to a chair (including a wheelchair)?: A Lot Help needed standing up from a chair using your arms (e.g., wheelchair or bedside chair)?: A Lot Help needed to walk in hospital room?: A Little Help needed climbing 3-5 steps with a railing? : A Little 6 Click Score: 14    End of Session Equipment Utilized During Treatment: Gait belt Activity Tolerance: Patient tolerated treatment well;Patient limited by pain;Other (comment) (Pt willining participated in PT interventions, but gait was limited 2/2 pain in the RLE) Patient left: in chair;with call bell/phone within reach    PT Visit Diagnosis: Other abnormalities of gait and mobility (R26.89);Difficulty in walking, not elsewhere classified (R26.2)     Time: 9470-9628 PT Time Calculation (min) (ACUTE ONLY): 25 min  Charges:  $Gait Training: 8-22 mins $Therapeutic Exercise: 8-22 mins                     Duanne Guess, PT, DPT 03/22/21, 2:16 PM    Carlosdaniel Grob Malcolm [place] 03/22/2021, 2:10 PM

## 2021-03-22 NOTE — Plan of Care (Signed)
  Problem: Education: Goal: Knowledge of General Education information will improve Description: Including pain rating scale, medication(s)/side effects and non-pharmacologic comfort measures Outcome: Progressing   Problem: Clinical Measurements: Goal: Will remain free from infection Outcome: Progressing   Problem: Activity: Goal: Risk for activity intolerance will decrease Outcome: Progressing   Problem: Coping: Goal: Level of anxiety will decrease Outcome: Progressing   Problem: Elimination: Goal: Will not experience complications related to bowel motility Outcome: Progressing   Problem: Pain Managment: Goal: General experience of comfort will improve Outcome: Progressing   Problem: Safety: Goal: Ability to remain free from injury will improve Outcome: Progressing   Problem: Skin Integrity: Goal: Risk for impaired skin integrity will decrease Outcome: Progressing

## 2021-03-23 DIAGNOSIS — L03031 Cellulitis of right toe: Secondary | ICD-10-CM | POA: Diagnosis not present

## 2021-03-23 DIAGNOSIS — I10 Essential (primary) hypertension: Secondary | ICD-10-CM | POA: Diagnosis not present

## 2021-03-23 DIAGNOSIS — I739 Peripheral vascular disease, unspecified: Secondary | ICD-10-CM | POA: Diagnosis not present

## 2021-03-23 DIAGNOSIS — D696 Thrombocytopenia, unspecified: Secondary | ICD-10-CM | POA: Diagnosis not present

## 2021-03-23 LAB — BASIC METABOLIC PANEL
Anion gap: 7 (ref 5–15)
BUN: 42 mg/dL — ABNORMAL HIGH (ref 8–23)
CO2: 23 mmol/L (ref 22–32)
Calcium: 8.4 mg/dL — ABNORMAL LOW (ref 8.9–10.3)
Chloride: 106 mmol/L (ref 98–111)
Creatinine, Ser: 1.47 mg/dL — ABNORMAL HIGH (ref 0.61–1.24)
GFR, Estimated: 49 mL/min — ABNORMAL LOW (ref >60)
Glucose, Bld: 185 mg/dL — ABNORMAL HIGH (ref 70–99)
Potassium: 4.6 mmol/L (ref 3.5–5.1)
Sodium: 136 mmol/L (ref 135–145)

## 2021-03-23 LAB — CULTURE, BLOOD (ROUTINE X 2): Special Requests: ADEQUATE

## 2021-03-23 LAB — CBC
HCT: 24.4 % — ABNORMAL LOW (ref 39.0–52.0)
Hemoglobin: 8 g/dL — ABNORMAL LOW (ref 13.0–17.0)
MCH: 30.4 pg (ref 26.0–34.0)
MCHC: 32.8 g/dL (ref 30.0–36.0)
MCV: 92.8 fL (ref 80.0–100.0)
Platelets: 96 10*3/uL — ABNORMAL LOW (ref 150–400)
RBC: 2.63 MIL/uL — ABNORMAL LOW (ref 4.22–5.81)
RDW: 13.7 % (ref 11.5–15.5)
WBC: 4 10*3/uL (ref 4.0–10.5)
nRBC: 0 % (ref 0.0–0.2)

## 2021-03-23 LAB — GLUCOSE, CAPILLARY
Glucose-Capillary: 243 mg/dL — ABNORMAL HIGH (ref 70–99)
Glucose-Capillary: 127 mg/dL — ABNORMAL HIGH (ref 70–99)
Glucose-Capillary: 93 mg/dL (ref 70–99)

## 2021-03-23 NOTE — Progress Notes (Signed)
Physical Therapy Treatment Patient Details Name: Nathan Singleton MRN: [id number] DOB: [birth year] Today's Date: 03/23/2021    History of Present Illness Nathan Singleton is a 57yoM who comes to [facility] on 03/20/21 c dizziness and worsening Right foot pain- pt admitted with cellulitis of the right hallux.  Pt is s/p surgical debridement, arhtroplasty c podiaty 3/8. Pt taken back to OR with podiatry on 4/7 for hallux amputation. Patient orders for AMB in surgical shoe, heel weight bearing. PMH: poorly controlled DM2, aortic stenosis s/p TAVR, SSS s/p PM, HTN, chronic anemia. 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 No prior falls history.    PT Comments    Patient received supine in bed with the HOB elevated. Pt reports he just had his dressing changed on his R foot and he is having some increased pain and does not want to walk right now. Patient was agreeable to exercises in bed and seated EOB. Pt completed therapeutic exercises as follows: ankle pumps x20, SAQs x15, windshield wipers x20, SLR BIL x20, and LAQ x20 BIL. Pt provided verbal cues for form and pace. He required minA for bed mobility and coming from supine <> sit to manage RLE and avoid hitting his R foot on bed. Despite pt's increased pain he participated well in interventions today only refusing to ambulate 2/2 pain. Current PT interventions remain appropriate at this time.   Follow Up Recommendations  Home health PT     Equipment Recommendations  None recommended by PT    Recommendations for Other Services       Precautions / Restrictions Precautions Precautions: ICD/Pacemaker;Fall Other Brace: post-op shoe Restrictions Weight Bearing Restrictions: Yes RLE Weight Bearing: Partial weight bearing RLE Partial Weight Bearing Percentage or Pounds: Heel WB, RLW post-op shoe    Mobility  Bed Mobility Overal bed mobility: Needs Assistance Bed Mobility: Supine to Sit     Supine to sit: Min assist;Min guard      General bed mobility comments: Pt with minA for bed mobility 2/2 to LE weakness        Modified Rankin (Stroke Patients Only)       Balance Overall balance assessment: Needs assistance Sitting-balance support: Bilateral upper extremity supported Sitting balance-Leahy Scale: Normal Sitting balance - Comments: Pt safe seated on the EOB                                    Cognition Arousal/Alertness: Awake/alert Behavior During Therapy: WFL for tasks assessed/performed Overall Cognitive Status: Within Functional Limits for tasks assessed                                 General Comments: Pt alert and able to follow all commands      Exercises Total Joint Exercises Ankle Circles/Pumps: AROM;Both;20 reps Towel Squeeze: AROM;Both;20 reps Short Arc Quad: AROM;Strengthening;Right;15 reps Hip ABduction/ADduction: AROM;Both;20 reps Straight Leg Raises: AROM;Both;20 reps Long Arc Quad: AROM;Both;20 reps Other Exercises Other Exercises: Seated marches x20, VCs for pace and form for all there ex    General Comments        Pertinent Vitals/Pain Pain Assessment: Faces Faces Pain Scale: Hurts whole lot Pain Location: R foot, pt reports they just got finished changing his bandage and he is in a lot of pain Pain Descriptors / Indicators: Aching;Throbbing Pain Intervention(s): Limited activity within  patient's tolerance (Pt does not want to ambulate today due to pain in the RLE)    Home Living                      Prior Function            PT Goals (current goals can now be found in the care plan section) Acute Rehab PT Goals Patient Stated Goal: walk without pain PT Goal Formulation: With patient Time For Goal Achievement: 04/04/21 Potential to Achieve Goals: Good Progress towards PT goals: Progressing toward goals    Frequency    7X/week      PT Plan      Co-evaluation              AM-PAC PT "6 Clicks" Mobility    Outcome Measure  Help needed turning from your back to your side while in a flat bed without using bedrails?: A Little Help needed moving from lying on your back to sitting on the side of a flat bed without using bedrails?: A Little Help needed moving to and from a bed to a chair (including a wheelchair)?: A Little Help needed standing up from a chair using your arms (e.g., wheelchair or bedside chair)?: A Little Help needed to walk in hospital room?: A Little Help needed climbing 3-5 steps with a railing? : A Lot 6 Click Score: 17    End of Session   Activity Tolerance: Patient tolerated treatment well;Patient limited by pain;Other (comment) (Pt with increased pain 2/2 recent dressing change refused ambulation today) Patient left: with call bell/phone within reach;in bed   PT Visit Diagnosis: Other abnormalities of gait and mobility (R26.89);Difficulty in walking, not elsewhere classified (R26.2)     Time: [id number] PT Time Calculation (min) (ACUTE ONLY): 16 min  Charges:  $Therapeutic Exercise: 8-22 mins                     Duanne Guess, PT, DPT 03/23/21, 1:36 PM    Nathan Singleton Nathan Singleton 03/23/2021, 1:32 PM

## 2021-03-23 NOTE — TOC Initial Note (Signed)
Transition of Care [facility]) - Initial/Assessment Note    Patient Details  Name: Nathan Singleton MRN: [id number] Date of Birth: 04/15/1941  Transition of [facility]) CM/SW Contact:    Nathan Masson, RN Phone [phone] 03/23/2021, 1:26 PM  Clinical Narrative:                 Recommended HHPT upon his discharge from the hospital possibly Monday. RN spoke with the significant other Nathan Singleton) due to pt's [facility]. Decided on [place] for [place] services. RN spoke with Nathan Singleton to confirm services with possible discharged on Monday. Pt lives in a single family home with his significant other. States pt able to afford all medications via delivery services however if immediate medication needed can obtain from his local pharmacy. Two steps to enter the home and pt has a cane and rolling walker if needed. No other needed presented at this time.  TOC team will continue to follow for any additional needs.  Expected Discharge Plan: [place] Barriers to Discharge: No Barriers Identified   Patient Goals and CMS Choice     Choice offered to / list presented to :  (Signifiant other Nathan Singleton))  Expected Discharge Plan and Services Expected Discharge Plan: [place]   Discharge Planning Services: CM Consult                                 [facility] Agency: [place] Date [place]: 03/23/21 Time [place]: [id number] Representative spoke with at [place]: Nathan Singleton  Prior Living Arrangements/Services   Lives with:: Significant Other Nathan Singleton) Patient language and need for interpreter reviewed:: Yes Do you feel safe going back to the place where you live?: Yes      Need for Family Participation in Patient Care: Yes (Comment) Care giver support system in place?: Yes (comment)   Criminal Activity/Legal Involvement Pertinent to Current Situation/Hospitalization: No - Comment as needed  Activities of Daily Living Home  Assistive Devices/Equipment: Cane (specify quad or straight),Walker (specify type) ADL Screening (condition at time of admission) Patient's cognitive ability adequate to safely complete daily activities?: Yes Is the patient deaf or have difficulty hearing?: Yes Does the patient have difficulty seeing, even when wearing glasses/contacts?: No Does the patient have difficulty concentrating, remembering, or making decisions?: Yes Patient able to express need for assistance with ADLs?: Yes Does the patient have difficulty dressing or bathing?: No Independently performs ADLs?: Yes (appropriate for developmental age) Does the patient have difficulty walking or climbing stairs?: Yes Weakness of Legs: Both Weakness of Arms/Hands: None  Permission Sought/Granted Permission sought to share information with : Case Manager Permission granted to share information with : Yes, Verbal Permission Granted              Emotional Assessment Appearance:: Appears stated age Attitude/Demeanor/Rapport: Engaged Affect (typically observed): Accepting Orientation: : Oriented to Self,Oriented to Place,Oriented to  Time   Psych Involvement: No (comment)  Admission diagnosis:  Cellulitis [L03.90] Cellulitis of toe of right foot [L03.031] Cellulitis of great toe, right [L03.031] Hypotension, unspecified hypotension type [I95.9] Patient Active Problem List   Diagnosis Date Noted  . Cellulitis of great toe, right 03/20/2021  . Type II diabetes mellitus with renal manifestations ([place]) 03/20/2021  . CKD (chronic kidney disease), stage IIIa 03/20/2021  . Dizziness 03/20/2021  . Chronic pain syndrome 03/03/2021  . Pharmacologic therapy 03/03/2021  .  Disorder of skeletal system 03/03/2021  . Problems influencing health status 03/03/2021  . 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[place]) 03/03/2021  . Chronic lower extremity pain (2ry area of Pain) (Bilateral) (R>L) 03/03/2021  . Chronic anticoagulation (Plavix)  03/03/2021  . Chronic shoulder pain (3ry area of Pain) (Left) 03/03/2021  . 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 Cervical facet hypertrophy (Multilevel) (Bilateral) 03/03/2021  . DDD (degenerative disc disease), cervical 03/03/2021  . BPH (benign prostatic hyperplasia) 02/14/2021  . Diabetic foot ulcer ([place]) 02/13/2021  . Normocytic anemia 11/26/2020  . Chronic kidney disease (CKD) 11/26/2020  . Claudication ([place]) 11/26/2020  . Contusion of right thigh 11/26/2020  . COPD (chronic obstructive pulmonary disease) ([place]) 11/26/2020  . Depression 11/26/2020  . Dyspnea 11/26/2020  . Erectile dysfunction 11/26/2020  . GERD without esophagitis 11/26/2020  . Gout 11/26/2020  . Hyperlipidemia, acquired 11/26/2020  . Hyperlipidemia 11/26/2020  . Myocardial infarction ([place]) 11/26/2020  . Personal history of other diseases of urinary system 11/26/2020  . Right leg swelling 11/26/2020  . Sleep apnea 11/26/2020  . Varicose veins 11/26/2020  . Venous insufficiency 11/26/2020  . Vitamin D deficiency 11/26/2020  . Chest pain 11/21/2020  . Thrombocytopenia ([place]) 11/21/2020  . S/P TAVR (transcatheter aortic valve replacement)   . PVD (peripheral vascular disease) ([place])   . Presence of permanent cardiac pacemaker   . Hypertension associated with diabetes ([place])   . Hyperlipidemia associated with type 2 diabetes mellitus ([place])   . History of GI bleed   . CAD (coronary artery disease)   . 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 Pacemaker St Jude device 11/19/2020  . Peripheral vascular disease, unspecified ([place]) stents to popliteal arteries many years ago 11/19/2020  . History of gingival bleeding 11/19/2020  . Coronary artery disease involving coronary bypass graft of native heart with angina pectoris ([place]) 11/19/2020  . Ascending aortic aneurysm 4 cm based on CT  done in December 2021 11/19/2020  . Hypertension   . DM2 (diabetes mellitus, type 2) ([place])   . Arthritis   . Closed nondisplaced fracture of greater trochanter of right femur ([place]) 01/25/2020  . DDD (degenerative disc disease), lumbar 12/10/2019  . Chronic low back pain (1ry area of Pain) (Bilateral) (L>R) w/o sciatica 12/09/2019  . 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 Urinary retention 12/09/2019  . Hearing loss 05/11/2019  . Obesity 04/07/2019  . 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 Bipolar disorder ([place]) 01/01/2019  . 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 Heart block 04/25/2018  . Calculus of gallbladder with acute cholecystitis without obstruction 09/21/2017  . Ulcer of jejunum 03/23/2014  . Insomnia 03/22/2014  . Blisters of multiple sites 01/18/2014  . Cellulitis 01/18/2014  . OA (osteoarthritis) of knee 06/09/2013  . Status post percutaneous transluminal coronary angioplasty 01/14/2011   PCP:  Ranae Plumber, PA Pharmacy:   [id number] [place] [address] [id number]: [phone]: [address]Phone: [phone]: [phone]    Social Determinants of Health (SDOH) Interventions    Readmission Risk Interventions Readmission Risk Prevention Plan 03/23/2021 02/14/2021  Transportation Screening Complete Complete  Medication Review (RN Care Manager) Complete Complete  PCP or Specialist appointment within 3-5 days of discharge  Complete Complete  HRI or Home Care Consult Complete Complete  SW Recovery Care/Counseling Consult Complete Complete  Palliative Care Screening Not Applicable Complete  Skilled Nursing Facility Not Applicable Complete

## 2021-03-23 NOTE — Plan of Care (Signed)
  Problem: Education: Goal: Knowledge of General Education information will improve Description Including pain rating scale, medication(s)/side effects and non-pharmacologic comfort measures Outcome: Progressing   Problem: Clinical Measurements: Goal: Will remain free from infection Outcome: Progressing   Problem: Coping: Goal: Level of anxiety will decrease Outcome: Progressing   Problem: Pain Managment: Goal: General experience of comfort will improve Outcome: Progressing   Problem: Safety: Goal: Ability to remain free from injury will improve Outcome: Progressing   Problem: Skin Integrity: Goal: Risk for impaired skin integrity will decrease Outcome: Progressing   

## 2021-03-23 NOTE — Progress Notes (Signed)
PROGRESS NOTE    Nathan Singleton   [id number]  DOB: 12/05/1941  PCP: Ranae Plumber, PA    DOA: 03/20/2021 LOS: 3   Brief Narrative   Nathan Singleton is a 80 y.o. male with medical history significant of PVD, HTN, HLD, DM, gout, s/p of TAVR with bioprosthesis, pacemaker placement, CAD, CKD-3a, BPH, thrombocytopenia, anemia, chronic pain, who presented to the ED on 03/20/21 with dizziness and worsening right foot pain in addition to swelling and progressive erythema.  Due to dizziness, pt checked BP which was 92/55.  EMS was called.   Pt hospitalized 3/3 to 3/11 due to infected diabetic foot ulcer and PVD. Underwent I&D with biopsy on 02/18/2021.  Biopsy did not show osteomyelitis. Treated with IV Vancomycin, Rocephin and Flagyl and discharged on Keflex for 10 days. Vascular surgery was consulted for PVD. Pt underwent lower extremity angiogram and angioplasty of the SFA and percutaneous transluminal angioplasty of the right dorsalis podalic artery and distal anterior tibial artery. Started on aspirin and Plavix.  Xray of Right Foot in the ED showed:  1. Interval right great toe osteotomy about the IP joint which was resected. No superimposed plain radiographic evidence of osteomyelitis at this time. 2. Generalized soft tissue swelling. Calcified peripheral vascular disease.   Admitted to hospitalist service.  Dr. Cleda Mccreedy of podiatry consulted.  Patient underwent amputation of the Right great toe on evening of 03/20/21.  On IV antibiotics with surgical culture / path pending. Infectious disease is following.    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 S/p amputation on 03/20/21 with Dr. Cleda Mccreedy. --Consults: Podiatry, Vascular, Infectious Disease - see their recs --Antibiotics: Vanc/Flagyl,/Rocephin >> Ancef --Follow blood and operative cultures/path --Off IV fluids --Pain control as needed --Bowel regimen --PT evaluation - HH PT and OT recommended  Dizziness -Improved with IV hydration.  Most likely due to soft BP, dehydration.  No focal neurologic findings on exam are reported by patient. --Get orthostatic vitals if he's symptomatic --PT and OT eval's  MSSA Bacteremia- one of four bottles with +MSSA, ?contaminant vs hematogenous spread of foot infection. --ID following --Repeat blood culture ordered, follow --TTE, if negative, will need TEE due to bioprosthetic valve and pacemaker in place, higher risk for endocarditis).   --PICC line once repeat cultures negative x 48 hours  Essential hypertension -antihypertensives held on admission due to soft BP.  Monitor BP and resume meds when indicated.  Insulin-dependent type 2 diabetes -Home regimen is Lantus 25 units and Victoza.  Initially placed on 15 units Lantus and sliding scale.   CBG's uncontrolled. --Increase Lantus to home dose 25 units qHS --Increase NovoLog to 10  units 3 TID WC --Continue sliding scale NovoLog --Hold Victoza  Hyperglycemia - CBG's in 200's today, insulin adjusted as above.  CKD stage IIIa -renal function stable.  Monitor BMP.  Peripheral vascular disease -with recent right lower extremity angioplasty as above. 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  Higher risk for endocarditis with bacteremia.  Echo pending.  S/p TAVR with Bioprosthetic Aortic Valve -  Higher risk for endocarditis with bacteremia.   Echo as above.  Thrombocytopenia -chronic, stable.  No signs of bleeding. --Follow CBC --Monitor for signs of bleeding  Normocytic anemia -with hemoglobin stable 9's --Monitor CBC  COPD -stable without exacerbation symptoms at this time --Albuterol as needed  Depression -continue Zoloft  GERD without esophagitis -continue PPI  Gout -continue allopurinol  Hyperlipidemia -continue Crestor  Obesity: Body mass index is 31.46 kg/m.  Complicates overall care and prognosis.  Recommend lifestyle modifications including physical activity and diet for weight loss and overall long-term health.   DVT prophylaxis: SCDs Start: 03/20/21 0730   Diet:  Diet Orders (From admission, onward)    Start     Ordered   03/20/21 2123  Diet Carb Modified Fluid consistency: Thin; Room service appropriate? Yes  Diet effective now       Question Answer Comment  Calorie Level Medium 1600-2000   Fluid consistency: Thin   Room service appropriate? Yes      03/20/21 2122            Code Status: Full Code    Subjective 03/23/21    Patient awake laying in bed. Reports some aching in his foot.  Otherwise says he feels fair.  No other acute complaints. Says he needs his boot put on so he can get up to the bathroom.    Disposition Plan & Communication   Status is: Inpatient  Inpatient status appropriate due to severity of illness requiring IV therapies and ongoing evaluation awaiting surgical cultures and pathology  Dispo: The patient is from: Home              Anticipated d/c is to: Home              Patient currently not medically stable for discharge   Difficult to place patient no   Consults, Procedures, Significant Events   Consultants:   Podiatry  Infectious disease  Procedures:   Right great toe amputation on 03/20/2021  Antimicrobials:  Anti-infectives (From admission, onward)   Start     Dose/Rate Route Frequency Ordered Stop    03/21/21 2200  ceFAZolin (ANCEF) IVPB 2g/100 mL premix        2 g 200 mL/hr over 30 Minutes Intravenous Every 8 hours 03/21/21 1130     03/21/21 0800  vancomycin (VANCOREADY) IVPB 1000 mg/200 mL  Status:  Discontinued        1,000 mg 200 mL/hr over 60 Minutes Intravenous Every 24 hours 03/20/21 1515 03/21/21 1130   03/20/21 0800  cefTRIAXone (ROCEPHIN) 2 g in sodium chloride 0.9 % 100 mL IVPB  Status:  Discontinued        2 g 200 mL/hr over 30 Minutes Intravenous Every 24 hours 03/20/21 0727 03/21/21 1130   03/20/21 0800  metroNIDAZOLE (FLAGYL) IVPB 500 mg  Status:  Discontinued        500 mg 100 mL/hr over 60 Minutes Intravenous Every 8 hours 03/20/21 0727 03/21/21 1130   03/20/21 0800  vancomycin (VANCOCIN) IVPB 1000 mg/200 mL premix  Status:  Discontinued        1,000 mg 200 mL/hr over 60 Minutes Intravenous Every 24 hours 03/20/21 0732 03/20/21 1515   03/20/21 0400  piperacillin-tazobactam (ZOSYN) IVPB 3.375 g        3.375 g  100 mL/hr over 30 Minutes Intravenous  Once 03/20/21 0353 03/20/21 0444   03/20/21 0400  vancomycin (VANCOCIN) IVPB 1000 mg/200 mL premix        1,000 mg 200 mL/hr over 60 Minutes Intravenous  Once 03/20/21 0353 03/20/21 0552        Micro    Objective   Vitals:   03/22/21 2021 03/23/21 0451 03/23/21 0828 03/23/21 1609  BP: 135/66 133/66 117/62 136/67  Pulse: 66 65 66 62  Resp: '16 18 18 16  '$ Temp: 98.1 F (36.7 C) 99 F (37.2 C) 97.9 F (36.6 C) 99.1 F (37.3 C)  TempSrc:      SpO2: 99% 98% 99% 99%  Weight:      Height:        Intake/Output Summary (Last 24 hours) at 03/23/2021 2024 Last data filed at 03/23/2021 1936 Gross per 24 hour  Intake 680 ml  Output 1 ml  Net 679 ml   Filed Weights   03/20/21 0332  Weight: 105.2 kg    Physical Exam:  General exam: awake, alert, no acute distress, laying in bed Respiratory system: CTAB, no wheezes or rhonchi, on room air, normal respiratory effort Cardiovascular system: normal S1/S2, RRR, no  edema Gastrointestinal system: soft, NT, ND, +bowel sounds. Extremities: Right foot Ace bandage intact, no cyanosis, normal tone   Labs   Data Reviewed: I have personally reviewed following labs and imaging studies  CBC: Recent Labs  Lab 03/20/21 0346 03/21/21 0427 03/22/21 0451 03/23/21 0446  WBC 6.9 6.4 5.1 4.0  NEUTROABS 5.5  --   --   --   HGB 9.6* 9.0* 8.2* 8.0*  HCT 28.0* 27.0* 24.6* 24.4*  MCV 91.5 91.2 92.8 92.8  PLT 95* 84* 84* 96*   Basic Metabolic Panel: Recent Labs  Lab 03/20/21 0346 03/21/21 0427 03/22/21 0451 03/23/21 0446  NA 136 136 134* 136  K 4.4 4.0 4.2 4.6  CL 110 110 104 106  CO2 18* 19* 23 23  GLUCOSE 172* 252* 262* 185*  BUN 48* 38* 41* 42*  CREATININE 1.45* 1.45* 1.49* 1.47*  CALCIUM 8.7* 8.5* 8.2* 8.4*   GFR: Estimated Creatinine Clearance: 51.9 mL/min (A) (by C-G formula based on SCr of 1.47 mg/dL (H)). Liver Function Tests: Recent Labs  Lab 03/20/21 0346  AST 37  ALT 38  ALKPHOS 95  BILITOT 0.6  PROT 7.0  ALBUMIN 3.8   No results for input(s): LIPASE, AMYLASE in the last 168 hours. No results for input(s): AMMONIA in the last 168 hours. Coagulation Profile: Recent Labs  Lab 03/20/21 0346  INR 1.2   Cardiac Enzymes: No results for input(s): CKTOTAL, CKMB, CKMBINDEX, TROPONINI in the last 168 hours. BNP (last 3 results) No results for input(s): PROBNP in the last 8760 hours. HbA1C: No results for input(s): HGBA1C in the last 72 hours. CBG: Recent Labs  Lab 03/22/21 1156 03/22/21 1652 03/22/21 2107 03/23/21 0807 03/23/21 1609  GLUCAP 325* 124* 154* 243* 127*   Lipid Profile: No results for input(s): CHOL, HDL, LDLCALC, TRIG, CHOLHDL, LDLDIRECT in the last 72 hours. Thyroid Function Tests: No results for input(s): TSH, T4TOTAL, FREET4, T3FREE, THYROIDAB in the last 72 hours. Anemia Panel: No results for input(s): VITAMINB12, FOLATE, FERRITIN, TIBC, IRON, RETICCTPCT in the last 72 hours. Sepsis Labs: Recent Labs   Lab 03/20/21 0346 03/20/21 0352 03/21/21 0427 03/22/21 0451  PROCALCITON 0.61  --  0.58 0.49  LATICACIDVEN  --  0.9  --   --  Recent Results (from the past 240 hour(s))  Blood culture (routine x 2)     Status: Abnormal   Collection Time: 03/20/21  3:52 AM   Specimen: BLOOD LEFT FOREARM  Result Value Ref Range Status   Specimen Description   Final    BLOOD LEFT FOREARM Performed at [facility], [address], [place], [place] [address]    Special Requests   Final    BOTTLES DRAWN AEROBIC AND ANAEROBIC Blood Culture adequate volume Performed at [facility], [address] [place], [place] [address]    Culture  Setup Time   Final    AEROBIC BOTTLE ONLY GRAM POSITIVE COCCI Organism ID to follow CRITICAL RESULT CALLED TO, READ BACK BY AND VERIFIED WITH: ALEX CHAPPELL PHARMD [place] 03/20/21 HNM Performed at [facility], [address] [place], [place] [address]    Culture STAPHYLOCOCCUS AUREUS (A)  Final   Report Status 03/23/2021 FINAL  Final   Organism ID, Bacteria STAPHYLOCOCCUS AUREUS  Final      Susceptibility   Staphylococcus aureus - MIC*    CIPROFLOXACIN <=0.5 SENSITIVE Sensitive     ERYTHROMYCIN RESISTANT Resistant     GENTAMICIN <=0.5 SENSITIVE Sensitive     OXACILLIN 0.5 SENSITIVE Sensitive     TETRACYCLINE <=1 SENSITIVE Sensitive     VANCOMYCIN <=0.5 SENSITIVE Sensitive     TRIMETH/SULFA <=10 SENSITIVE Sensitive     CLINDAMYCIN RESISTANT Resistant     RIFAMPIN <=0.5 SENSITIVE Sensitive     Inducible Clindamycin POSITIVE Resistant     * STAPHYLOCOCCUS AUREUS  Blood Culture ID Panel (Reflexed)     Status: Abnormal   Collection Time: 03/20/21  3:52 AM  Result Value Ref Range Status   Enterococcus faecalis NOT DETECTED NOT DETECTED Final   Enterococcus Faecium NOT DETECTED NOT DETECTED Final   Listeria monocytogenes NOT DETECTED NOT DETECTED Final   Staphylococcus species DETECTED (A) NOT DETECTED Final    Comment: CRITICAL RESULT  CALLED TO, READ BACK BY AND VERIFIED WITH: ALEX CHAPPELL PHARMD [id number] 03/20/21 HNM    Staphylococcus aureus (BCID) DETECTED (A) NOT DETECTED Final    Comment: CRITICAL RESULT CALLED TO, READ BACK BY AND VERIFIED WITH: [place] [id number] 03/20/21 HNM    Staphylococcus epidermidis NOT DETECTED NOT DETECTED Final   Staphylococcus lugdunensis NOT DETECTED NOT DETECTED Final   Streptococcus species NOT DETECTED NOT DETECTED Final   Streptococcus agalactiae NOT DETECTED NOT DETECTED Final   Streptococcus pneumoniae NOT DETECTED NOT DETECTED Final   Streptococcus pyogenes NOT DETECTED NOT DETECTED Final   A.calcoaceticus-baumannii NOT DETECTED NOT DETECTED Final   Bacteroides fragilis NOT DETECTED NOT DETECTED Final   Enterobacterales NOT DETECTED NOT DETECTED Final   Enterobacter cloacae complex NOT DETECTED NOT DETECTED Final   Escherichia coli NOT DETECTED NOT DETECTED Final   Klebsiella aerogenes NOT DETECTED NOT DETECTED Final   Klebsiella oxytoca NOT DETECTED NOT DETECTED Final   Klebsiella pneumoniae NOT DETECTED NOT DETECTED Final   Proteus species NOT DETECTED NOT DETECTED Final   Salmonella species NOT DETECTED NOT DETECTED Final   Serratia marcescens NOT DETECTED NOT DETECTED Final   Haemophilus influenzae NOT DETECTED NOT DETECTED Final   Neisseria meningitidis NOT DETECTED NOT DETECTED Final   Pseudomonas aeruginosa NOT DETECTED NOT DETECTED Final   Stenotrophomonas maltophilia NOT DETECTED NOT DETECTED Final   Candida albicans NOT DETECTED NOT DETECTED Final   Candida auris NOT DETECTED NOT DETECTED Final   Candida glabrata NOT DETECTED NOT DETECTED Final  Candida krusei NOT DETECTED NOT DETECTED Final   Candida parapsilosis NOT DETECTED NOT DETECTED Final   Candida tropicalis NOT DETECTED NOT DETECTED Final   Cryptococcus neoformans/gattii NOT DETECTED NOT DETECTED Final   Meth resistant mecA/C and MREJ NOT DETECTED NOT DETECTED Final    Comment: Performed at [facility], [address] [place], [place] [address]  Resp Panel by RT-PCR (Flu A&B, Covid) Nasopharyngeal Swab     Status: None   Collection Time: 03/20/21  4:01 AM   Specimen: Nasopharyngeal Swab; Nasopharyngeal(NP) swabs in vial transport medium  Result Value Ref Range Status   SARS Coronavirus 2 by RT PCR NEGATIVE NEGATIVE Final    Comment: (NOTE) SARS-CoV-2 target nucleic acids are NOT DETECTED.  The SARS-CoV-2 RNA is generally detectable in upper respiratory specimens during the acute phase of infection. The lowest concentration of SARS-CoV-2 viral copies this assay can detect is 138 copies/mL. A negative result does not preclude SARS-Cov-2 infection and should not be used as the sole basis for treatment or other patient management decisions. A negative result may occur with  improper specimen collection/handling, submission of specimen other than nasopharyngeal swab, presence of viral mutation(s) within the areas targeted by this assay, and inadequate number of viral copies(<138 copies/mL). A negative result must be combined with clinical observations, patient history, and epidemiological information. The expected result is Negative.  Fact Sheet for Patients:  EntrepreneurPulse.com.au  Fact Sheet for Healthcare Providers:  IncredibleEmployment.be  This test is no t yet approved or cleared by the [place] FDA and  has been authorized for detection and/or diagnosis of SARS-CoV-2 by FDA under an Emergency Use Authorization (EUA). This EUA will remain  in effect (meaning this test can be used) for the duration of the COVID-19 declaration under Section 564(b)(1) of the Act, 21 U.S.C.section 360bbb-3(b)(1), unless the authorization is terminated  or revoked sooner.       Influenza A by PCR NEGATIVE NEGATIVE Final   Influenza B by PCR NEGATIVE NEGATIVE Final    Comment: (NOTE) The Xpert Xpress SARS-CoV-2/FLU/RSV plus assay is  intended as an aid in the diagnosis of influenza from Nasopharyngeal swab specimens and should not be used as a sole basis for treatment. Nasal washings and aspirates are unacceptable for Xpert Xpress SARS-CoV-2/FLU/RSV testing.  Fact Sheet for Patients: EntrepreneurPulse.com.au  Fact Sheet for Healthcare Providers: IncredibleEmployment.be  This test is not yet approved or cleared by the [place] FDA and has been authorized for detection and/or diagnosis of SARS-CoV-2 by FDA under an Emergency Use Authorization (EUA). This EUA will remain in effect (meaning this test can be used) for the duration of the COVID-19 declaration under Section 564(b)(1) of the Act, 21 U.S.C. section 360bbb-3(b)(1), unless the authorization is terminated or revoked.  Performed at [facility], [address] [place], [place] [address]   Blood culture (routine x 2)     Status: None (Preliminary result)   Collection Time: 03/20/21 11:43 AM   Specimen: BLOOD  Result Value Ref Range Status   Specimen Description BLOOD WRIST  Final   Special Requests   Final    BOTTLES DRAWN AEROBIC AND ANAEROBIC Blood Culture results may not be optimal due to an excessive volume of blood received in culture bottles   Culture   Final    NO GROWTH 3 DAYS Performed at [facility], [address] [place], [place] [address]    Report Status PENDING  Incomplete  Aerobic/Anaerobic Culture w Gram Stain (surgical/deep  wound)     Status: None (Preliminary result)   Collection Time: 03/20/21  8:41 PM   Specimen: PATH Other; Tissue  Result Value Ref Range Status   Specimen Description   Final    TISSUE Performed at [facility], [address] [place], [place] [address]    Special Requests   Final    RIGHT GREAT TOE Performed at [facility], [place], [place] [address]    Gram Stain   Final    ABUNDANT WBC PRESENT,BOTH PMN AND  MONONUCLEAR FEW GRAM POSITIVE COCCI Performed at [facility], [place] [address], [place], [place] [address]    Culture   Final    ABUNDANT STAPHYLOCOCCUS AUREUS NO ANAEROBES ISOLATED; CULTURE IN PROGRESS FOR 5 DAYS    Report Status PENDING  Incomplete   Organism ID, Bacteria STAPHYLOCOCCUS AUREUS  Final      Susceptibility   Staphylococcus aureus - MIC*    CIPROFLOXACIN <=0.5 SENSITIVE Sensitive     ERYTHROMYCIN >=8 RESISTANT Resistant     GENTAMICIN <=0.5 SENSITIVE Sensitive     OXACILLIN 0.5 SENSITIVE Sensitive     TETRACYCLINE <=1 SENSITIVE Sensitive     VANCOMYCIN <=0.5 SENSITIVE Sensitive     TRIMETH/SULFA <=10 SENSITIVE Sensitive     CLINDAMYCIN RESISTANT Resistant     RIFAMPIN <=0.5 SENSITIVE Sensitive     Inducible Clindamycin POSITIVE Resistant     * ABUNDANT STAPHYLOCOCCUS AUREUS  Culture, blood (single) w Reflex to ID Panel     Status: None (Preliminary result)   Collection Time: 03/22/21  4:51 AM   Specimen: BLOOD  Result Value Ref Range Status   Specimen Description BLOOD  RIGHT ARM  Final   Special Requests   Final    BOTTLES DRAWN AEROBIC AND ANAEROBIC Blood Culture results may not be optimal due to an excessive volume of blood received in culture bottles   Culture   Final    NO GROWTH 1 DAY Performed at [facility], [address] [place], [place] [address]    Report Status PENDING  Incomplete      Imaging Studies   No results found.   Medications   Scheduled Meds: . allopurinol  100 mg Oral Daily  . aspirin EC  81 mg Oral Daily  . baclofen  10 mg Oral BID  . cholecalciferol  5,000 Units Oral Daily  . clopidogrel  75 mg Oral Daily  . docusate sodium  200 mg Oral BID  . gabapentin  100 mg Oral Daily  . insulin aspart  0-5 Units Subcutaneous QHS  . insulin aspart  0-9 Units Subcutaneous TID WC  . insulin aspart  10 Units Subcutaneous TID WC  . insulin glargine  25 Units Subcutaneous QHS  . isosorbide mononitrate  30 mg Oral Daily   . linaclotide  145 mcg Oral Daily  . loratadine  10 mg Oral Daily  . multivitamin with minerals  1 tablet Oral Daily  . pantoprazole  40 mg Oral Daily  . rosuvastatin  40 mg Oral Daily  . sertraline  200 mg Oral Daily  . tamsulosin  0.4 mg Oral Daily   Continuous Infusions: . sodium chloride 10 mL/hr at 03/22/21 0534  .  ceFAZolin (ANCEF) IV 2 g (03/23/21 1346)       LOS: 3 days    Time spent: 20 minutes    Ezekiel Slocumb, DO [facility]  03/23/2021, 8:24 PM      If  7PM-7AM, please contact night-coverage. How to contact the [facility] Attending or Consulting provider [place] or covering provider during after hours [place], for this patient?    1. Check the care team in [facility] and look for a) attending/consulting [facility] provider listed and b) the [facility] team listed 2. Log into [email or web address] and use [place]'s universal password to access. If you do not have the password, please contact the hospital operator. 3. Locate the [facility] provider you are looking for under [facility] and page to a number that you can be directly reached. 4. If you still have difficulty reaching the provider, please page the [facility] (Director on Call) for the Hospitalists listed on amion for assistance.

## 2021-03-23 NOTE — Progress Notes (Signed)
3 Days Post-Op   Subjective/Chief Complaint: Patient seen.  Complaining of more pain over the last 24 hours.   Objective: Vital signs in last 24 hours: Temp:  [97.6 F (36.4 C)-99 F (37.2 C)] 97.9 F (36.6 C) (04/10 0828) Pulse Rate:  [60-66] 66 (04/10 0828) Resp:  [16-18] 18 (04/10 0828) BP: (102-137)/(51-70) 117/62 (04/10 0828) SpO2:  [93 %-99 %] 99 % (04/10 0828) Last BM Date: 03/23/21  Intake/Output from previous day: 04/09 0701 - 04/10 0700 In: 1017 [P.O.:717; IV Piggyback:300] Out: 1 [Urine:1] Intake/Output this shift: No intake/output data recorded.  Some moderate bleeding noted on the bandaging.  Mostly from the dorsal and proximal aspect of the incision area.  Upon removal incision is still well coapted with some continued mild dusky appearance around the medial portion of the incision.  No expressible purulence or fluid.  Some erythema noted.      Lab Results:  Recent Labs    03/22/21 0451 03/23/21 0446  WBC 5.1 4.0  HGB 8.2* 8.0*  HCT 24.6* 24.4*  PLT 84* 96*   BMET Recent Labs    03/22/21 0451 03/23/21 0446  NA 134* 136  K 4.2 4.6  CL 104 106  CO2 23 23  GLUCOSE 262* 185*  BUN 41* 42*  CREATININE 1.49* 1.47*  CALCIUM 8.2* 8.4*   PT/INR No results for input(s): LABPROT, INR in the last 72 hours. ABG No results for input(s): PHART, HCO3 in the last 72 hours.  Invalid input(s): PCO2, PO2  Studies/Results: No results found.  Anti-infectives: Anti-infectives (From admission, onward)   Start     Dose/Rate Route Frequency Ordered Stop   03/21/21 2200  ceFAZolin (ANCEF) IVPB 2g/100 mL premix        2 g 200 mL/hr over 30 Minutes Intravenous Every 8 hours 03/21/21 1130     03/21/21 0800  vancomycin (VANCOREADY) IVPB 1000 mg/200 mL  Status:  Discontinued        1,000 mg 200 mL/hr over 60 Minutes Intravenous Every 24 hours 03/20/21 1515 03/21/21 1130   03/20/21 0800  cefTRIAXone (ROCEPHIN) 2 g in sodium chloride 0.9 % 100 mL IVPB  Status:   Discontinued        2 g 200 mL/hr over 30 Minutes Intravenous Every 24 hours 03/20/21 0727 03/21/21 1130   03/20/21 0800  metroNIDAZOLE (FLAGYL) IVPB 500 mg  Status:  Discontinued        500 mg 100 mL/hr over 60 Minutes Intravenous Every 8 hours 03/20/21 0727 03/21/21 1130   03/20/21 0800  vancomycin (VANCOCIN) IVPB 1000 mg/200 mL premix  Status:  Discontinued        1,000 mg 200 mL/hr over 60 Minutes Intravenous Every 24 hours 03/20/21 0732 03/20/21 1515   03/20/21 0400  piperacillin-tazobactam (ZOSYN) IVPB 3.375 g        3.375 g 100 mL/hr over 30 Minutes Intravenous  Once 03/20/21 0353 03/20/21 0444   03/20/21 0400  vancomycin (VANCOCIN) IVPB 1000 mg/200 mL premix        1,000 mg 200 mL/hr over 60 Minutes Intravenous  Once 03/20/21 0353 03/20/21 0552      Assessment/Plan: s/p Procedure(s): AMPUTATION TOE (Right) Assessment: Status post amputation right hallux.   Plan: Betadine and a sterile bandage applied to the right foot.  Continue to observe closely.  Patient may need further amputation or debridement.  Consider reevaluation by vascular depending on how the wound looks over the next couple of days.  LOS: 3 days  Durward Fortes 03/23/2021

## 2021-03-24 ENCOUNTER — Ambulatory Visit: Payer: Medicare Other | Admitting: Pain Medicine

## 2021-03-24 ENCOUNTER — Encounter (INDEPENDENT_AMBULATORY_CARE_PROVIDER_SITE_OTHER): Payer: Self-pay | Admitting: Nurse Practitioner

## 2021-03-24 ENCOUNTER — Inpatient Hospital Stay (HOSPITAL_COMMUNITY)
Admit: 2021-03-24 | Discharge: 2021-03-24 | Disposition: A | Discharge status: Home / Self Care | Payer: 59 | Attending: Infectious Diseases | Admitting: Infectious Diseases

## 2021-03-24 ENCOUNTER — Inpatient Hospital Stay: Payer: Self-pay

## 2021-03-24 DIAGNOSIS — R7881 Bacteremia: Secondary | ICD-10-CM

## 2021-03-24 DIAGNOSIS — I739 Peripheral vascular disease, unspecified: Secondary | ICD-10-CM | POA: Diagnosis not present

## 2021-03-24 DIAGNOSIS — L03031 Cellulitis of right toe: Secondary | ICD-10-CM | POA: Diagnosis not present

## 2021-03-24 DIAGNOSIS — D696 Thrombocytopenia, unspecified: Secondary | ICD-10-CM | POA: Diagnosis not present

## 2021-03-24 LAB — GLUCOSE, CAPILLARY
Glucose-Capillary: 122 mg/dL — ABNORMAL HIGH (ref 70–99)
Glucose-Capillary: 191 mg/dL — ABNORMAL HIGH (ref 70–99)
Glucose-Capillary: 170 mg/dL — ABNORMAL HIGH (ref 70–99)
Glucose-Capillary: 101 mg/dL — ABNORMAL HIGH (ref 70–99)

## 2021-03-24 LAB — CBC
HCT: 24.9 % — ABNORMAL LOW (ref 39.0–52.0)
Hemoglobin: 8.4 g/dL — ABNORMAL LOW (ref 13.0–17.0)
MCH: 30.9 pg (ref 26.0–34.0)
MCHC: 33.7 g/dL (ref 30.0–36.0)
MCV: 91.5 fL (ref 80.0–100.0)
Platelets: 118 10*3/uL — ABNORMAL LOW (ref 150–400)
RBC: 2.72 MIL/uL — ABNORMAL LOW (ref 4.22–5.81)
RDW: 13.7 % (ref 11.5–15.5)
WBC: 3.7 10*3/uL — ABNORMAL LOW (ref 4.0–10.5)
nRBC: 0 % (ref 0.0–0.2)

## 2021-03-24 LAB — BASIC METABOLIC PANEL
Anion gap: 7 (ref 5–15)
BUN: 53 mg/dL — ABNORMAL HIGH (ref 8–23)
CO2: 24 mmol/L (ref 22–32)
Calcium: 8.6 mg/dL — ABNORMAL LOW (ref 8.9–10.3)
Chloride: 107 mmol/L (ref 98–111)
Creatinine, Ser: 1.54 mg/dL — ABNORMAL HIGH (ref 0.61–1.24)
GFR, Estimated: 46 mL/min — ABNORMAL LOW (ref >60)
Glucose, Bld: 107 mg/dL — ABNORMAL HIGH (ref 70–99)
Potassium: 4.6 mmol/L (ref 3.5–5.1)
Sodium: 138 mmol/L (ref 135–145)

## 2021-03-24 LAB — ECHOCARDIOGRAM COMPLETE
AR max vel: 2.32 cm2
AV Area VTI: 2.4 cm2
AV Area mean vel: 2.3 cm2
AV Mean grad: 16 mmHg
AV Peak grad: 27.2 mmHg
Ao pk vel: 2.61 m/s
Area-P 1/2: 2.68 cm2
Height: 72 in
MV VTI: 3.54 cm2
S' Lateral: 3 cm
Single Plane A4C EF: 54.6 %
Weight: 3712 oz

## 2021-03-24 LAB — MAGNESIUM: Magnesium: 2.2 mg/dL (ref 1.7–2.4)

## 2021-03-24 LAB — SURGICAL PATHOLOGY

## 2021-03-24 MED ORDER — FUROSEMIDE 20 MG PO TABS
20.0000 mg | ORAL_TABLET | Freq: Every day | ORAL | Status: DC
  Administered 2021-03-24 – 2021-03-28 (×5): 20 mg via ORAL
  Filled 2021-03-24 (×5): qty 1

## 2021-03-24 MED ORDER — SODIUM CHLORIDE 0.9% FLUSH
10.0000 mL | Freq: Two times a day (BID) | INTRAVENOUS | Status: DC
  Administered 2021-03-25 (×3): 10 mL
  Administered 2021-03-26: 30 mL
  Administered 2021-03-26: 10 mL
  Administered 2021-03-27: 30 mL
  Administered 2021-03-27 – 2021-03-28 (×2): 10 mL

## 2021-03-24 MED ORDER — CHLORHEXIDINE GLUCONATE CLOTH 2 % EX PADS
6.0000 | MEDICATED_PAD | Freq: Every day | CUTANEOUS | Status: DC
  Administered 2021-03-24 – 2021-03-28 (×5): 6 via TOPICAL

## 2021-03-24 MED ORDER — SODIUM CHLORIDE 0.9% FLUSH: 10.0000 mL | INTRAVENOUS | Status: DC | PRN

## 2021-03-24 MED ORDER — AMLODIPINE BESYLATE 10 MG PO TABS
10.0000 mg | ORAL_TABLET | Freq: Every day | ORAL | Status: DC
  Administered 2021-03-24 – 2021-03-26 (×3): 10 mg via ORAL
  Filled 2021-03-24 (×3): qty 1

## 2021-03-24 MED ORDER — LOSARTAN POTASSIUM 25 MG PO TABS: 25.0000 mg | ORAL_TABLET | Freq: Every day | ORAL | Status: DC

## 2021-03-24 MED ORDER — CARVEDILOL 3.125 MG PO TABS
6.2500 mg | ORAL_TABLET | Freq: Every day | ORAL | Status: DC
  Administered 2021-03-24 – 2021-03-27 (×4): 6.25 mg via ORAL
  Filled 2021-03-24 (×4): qty 2

## 2021-03-24 NOTE — Progress Notes (Signed)
Peripherally Inserted Central Catheter Placement  The IV Nurse has discussed with the patient and/or persons authorized to consent for the patient, the purpose of this procedure and the potential benefits and risks involved with this procedure.  The benefits include less needle sticks, lab draws from the catheter, and the patient may be discharged home with the catheter. Risks include, but not limited to, infection, bleeding, blood clot (thrombus formation), and puncture of an artery; nerve damage and irregular heartbeat and possibility to perform a PICC exchange if needed/ordered by physician.  Alternatives to this procedure were also discussed.  Bard Power PICC patient education guide, fact sheet on infection prevention and patient information card has been provided to patient /or left at bedside.    PICC Placement Documentation  PICC Single Lumen [id number] PICC Right Basilic 41 cm 0 cm (Active)  Indication for Insertion or Continuance of Line Prolonged intravenous therapies;Home intravenous therapies (PICC only) 03/24/21 1445  Exposed Catheter (cm) 0 cm 03/24/21 1445  Site Assessment Clean;Dry;Intact 03/24/21 1445  Line Status Flushed;Saline locked;Blood return noted 03/24/21 1445  Dressing Type Transparent;Securing device 03/24/21 1445  Dressing Status Clean;Dry;Intact 03/24/21 1445  Antimicrobial disc in place? Yes 03/24/21 1445  Dressing Intervention New dressing 03/24/21 1445  Dressing Change Due 03/31/21 03/24/21 [place], [place] 03/24/2021, 2:47 PM

## 2021-03-24 NOTE — Progress Notes (Signed)
[facility] Daily Progress Note   1) Received consult from podiatry due to gangrenous changes to recent first toe amputation site. 2) Will plan on right lower extremity angiogram with possible intervention with Dr. Lucky Cowboy on Thursday. 3) Full consult to follow.  Discussed with Dr. Ellis Parents Shaquanda Graves PA-C 03/24/2021 2:29 PM

## 2021-03-24 NOTE — Progress Notes (Signed)
Subjective:    Patient ID: Nathan Singleton, male    DOB: 1941/04/27, 80 y.o.   MRN: [id number] Chief Complaint  Patient presents with  . Follow-up    [facility] le angio follow up    The patient returns to the office for followup and review status post angiogram with intervention. The patient notes improvement in the lower extremity symptoms. No interval shortening of the patient's claudication distance or rest pain symptoms. Previous wounds   No new ulcers or wounds have occurred since the last visit.  There have been no significant changes to the patient's overall health care.  The patient denies amaurosis fugax or recent TIA symptoms. There are no recent neurological changes noted. The patient denies history of DVT, PE or superficial thrombophlebitis. The patient denies recent episodes of angina or shortness of breath.   ABI's Rt=1.18 and Lt=1.26 (previous ABI's Rt=1.08 and Lt=[place]) Duplex US of the right lower extremities has biphasic/triphasic waveforms with good toe waveforms.  The left has biphasic waveforms with slightly dampened toe waveforms.   Review of Systems  Skin: Positive for wound.  All other systems reviewed and are negative.      Objective:   Physical Exam Vitals reviewed.  HENT:     Head: Normocephalic.  Cardiovascular:     Rate and Rhythm: Normal rate.     Pulses: Decreased pulses.  Pulmonary:     Effort: Pulmonary effort is normal.  Neurological:     Mental Status: He is alert and oriented to person, place, and time.  Psychiatric:        Mood and Affect: Mood normal.        Behavior: Behavior normal.        Thought Content: Thought content normal.        Judgment: Judgment normal.     BP (!) 152/65 (BP Location: Right Arm)   Pulse 72   Resp 16   Wt 252 lb (114.3 kg)   BMI 34.18 kg/m   Past Medical History:  Diagnosis Date  . Aneurysm (arteriovenous) of coronary vessels   . Arthritis   . Ascending aortic aneurysm ([place])   . CAD (coronary artery  disease)   . Diabetes mellitus without complication ([place])   . History of GI bleed   . Hyperlipidemia associated with type 2 diabetes mellitus ([place])   . Hypertension associated with diabetes ([place])   . Presence of permanent cardiac pacemaker   . PVD (peripheral vascular disease) ([place])   . S/P TAVR (transcatheter aortic valve replacement)     Social History   Socioeconomic History  . Marital status: Single    Spouse name: Not on file  . Number of children: Not on file  . Years of education: Not on file  . Highest education level: Not on file  Occupational History  . Not on file  Tobacco Use  . Smoking status: Former Research scientist (life sciences)  . Smokeless tobacco: Never Used  Substance and Sexual Activity  . Alcohol use: Not Currently  . Drug use: Not Currently  . Sexual activity: Not on file  Other Topics Concern  . Not on file  Social History Narrative  . Not on file   Social Determinants of Health   Financial Resource Strain: Not on file  Food Insecurity: Not on file  Transportation Needs: Not on file  Physical Activity: Not on file  Stress: Not on file  Social Connections: Not on file  Intimate Partner Violence: Not on file    Past  Surgical History:  Procedure Laterality Date  . AMPUTATION TOE Right 03/20/2021   Procedure: AMPUTATION TOE;  Surgeon: Sharlotte Alamo, DPM;  Location: [facility] ORS;  Service: Podiatry;  Laterality: Right;  . Ankle repaired Right   . Carpal Tunnel repaired Left   . CATARACT EXTRACTION Bilateral   . COLONOSCOPY    . Copillar implant     . DG CHOLECYSTOGRAPHY GALL BLADDER ([place] HX)    . GASTRIC BYPASS    . heart valve replaced    . IRRIGATION AND DEBRIDEMENT FOOT Right 02/18/2021   Procedure: IRRIGATION AND DEBRIDEMENT FOOT-Right Great Toe;  Surgeon: [name], DPM;  Location: [facility] ORS;  Service: Podiatry;  Laterality: Right;  . LOWER EXTREMITY ANGIOGRAPHY Right 02/17/2021   Procedure: Lower Extremity Angiography;  Surgeon: Algernon Huxley, MD;  Location: [place] [facility] LAB;  Service: Cardiovascular;  Laterality: Right;  . PACEMAKER GENERATOR CHANGE    . REPLACEMENT TOTAL KNEE BILATERAL    . RTC    . STENT PLACE LEFT URETER ([facility] HX) Right    Right Leg  . Toe nail removed Bilateral     Family History  Problem Relation Age of Onset  . Heart disease Mother   . Alzheimer's disease Mother   . Heart disease Father   . Diabetes Father     Allergies  Allergen Reactions  . Atorvastatin Other (See Comments)    "bad for kidneys" Cramping   . Celecoxib Other (See Comments)    Other reaction(s): Other (See Comments) Kidney Problem Kidney Problem   . Dicyclomine Hcl Other (See Comments)    Stomach cramps  . Doxycycline Other (See Comments)    Per wife (per RN)- pt gets severe stomach pains even when takes with food and PCP said to not take    . Metformin Other (See Comments)    CBC Latest Ref Rng & Units 03/24/2021 03/23/2021 03/22/2021  WBC 4.0 - 10.5 K/uL 3.7(L) 4.0 5.1  Hemoglobin 13.0 - 17.0 g/dL 8.4(L) 8.0(L) 8.2(L)  Hematocrit 39.0 - 52.0 % 24.9(L) 24.4(L) 24.6(L)  Platelets 150 - 400 K/uL 118(L) 96(L) 84(L)      CMP     Component Value Date/Time   NA 138 03/24/2021 0540   NA 135 03/03/2021 1628   K 4.6 03/24/2021 0540   CL 107 03/24/2021 0540   CO2 24 03/24/2021 0540   GLUCOSE 107 (H) 03/24/2021 0540   BUN 53 (H) 03/24/2021 0540   BUN 50 (H) 03/03/2021 1628   CREATININE 1.54 (H) 03/24/2021 0540   CALCIUM 8.6 (L) 03/24/2021 0540   PROT 7.0 03/20/2021 0346   PROT 7.0 03/03/2021 1628   ALBUMIN 3.8 03/20/2021 0346   ALBUMIN 4.3 03/03/2021 1628   AST 37 03/20/2021 0346   ALT 38 03/20/2021 0346   ALKPHOS 95 03/20/2021 0346   BILITOT 0.6 03/20/2021 0346   BILITOT 0.3 03/03/2021 1628   GFRNONAA 46 (L) 03/24/2021 0540   GFRAA 76 11/19/2020 1439     VAS [place] ABI WITH/WO TBI  Result Date: 03/21/2021 LOWER EXTREMITY DOPPLER STUDY Indications: Peripheral artery disease.  Vascular Interventions: 02/17/21 Right PTA of SFA,  DP, and ATA. Performing Technologist: Concha Norway RVT  Examination Guidelines: A complete evaluation includes at minimum, Doppler waveform signals and systolic blood pressure reading at the level of bilateral brachial, anterior tibial, and posterior tibial arteries, when vessel segments are accessible. Bilateral testing is considered an integral part of a complete examination. Photoelectric Plethysmograph (PPG) waveforms and toe systolic pressure readings are  included as required and additional duplex testing as needed. Limited examinations for reoccurring indications may be performed as noted.  ABI Findings: +---------+------------------+-----+---------+--------+ Right    Rt Pressure (mmHg)IndexWaveform Comment  +---------+------------------+-----+---------+--------+ Brachial 159                                      +---------+------------------+-----+---------+--------+ ATA      187               1.18 biphasic          +---------+------------------+-----+---------+--------+ PTA      185               1.16 triphasic         +---------+------------------+-----+---------+--------+ Great Toe146               0.92 Normal            +---------+------------------+-----+---------+--------+ +---------+------------------+-----+--------+-------+ Left     Lt Pressure (mmHg)IndexWaveformComment +---------+------------------+-----+--------+-------+ ATA      201               1.26 biphasic        +---------+------------------+-----+--------+-------+ PTA      196               1.23 biphasic        +---------+------------------+-----+--------+-------+ Great Toe143               0.90 Abnormal        +---------+------------------+-----+--------+-------+ +-------+-----------+-----------+------------+------------+ ABI/TBIToday's ABIToday's TBIPrevious ABIPrevious TBI +-------+-----------+-----------+------------+------------+ Right  1.18       .92        1.08        .73           +-------+-----------+-----------+------------+------------+ Left   1.26       .90        [place]          .81          +-------+-----------+-----------+------------+------------+  Summary: Right: Resting right ankle-brachial index is within normal range. No evidence of significant right lower extremity arterial disease. The right toe-brachial index is normal. Left: Resting left ankle-brachial index is within normal range. No evidence of significant left lower extremity arterial disease. The left toe-brachial index is normal.  *See table(s) above for measurements and observations.  Electronically signed by Leotis Pain MD on 03/21/2021 at 10:09:19 AM.   Final        Assessment & Plan:   1. 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  The patient reports that the claudication symptoms and leg pain is essentially gone.   The patient denies lifestyle limiting changes at this point in time.  No further invasive studies, angiography or surgery at this time The patient should continue walking and begin a more formal exercise program.  The patient should continue antiplatelet therapy and aggressive treatment of the lipid abnormalities  The patient should continue wearing graduated compression socks 10-15 mmHg strength to control the mild edema.  Patient should undergo noninvasive studies as ordered. The patient will follow up with me after the studies.    2. Primary hypertension Continue antihypertensive medications as already ordered, these medications have been reviewed and there are no changes at this time.   3. Diabetes mellitus without complication ([place]) Continue hypoglycemic medications as already ordered, these medications  have been reviewed and there are no changes at this time.  Hgb A1C to be monitored as already arranged by primary  service   4. Hyperlipidemia associated with type 2 diabetes mellitus ([place]) Continue statin as ordered and reviewed, no changes at this time    No current facility-administered medications on file prior to visit.   Current Outpatient Medications on File Prior to Visit  Medication Sig Dispense Refill  . allopurinol (ZYLOPRIM) 100 MG tablet Take 100 mg by mouth daily.    Marland Kitchen amLODipine (NORVASC) 10 MG tablet Take 10 mg by mouth daily.    . baclofen (LIORESAL) 10 MG tablet Take 10 mg by mouth 2 (two) times daily.    Marland Kitchen BAYER ASPIRIN EC LOW DOSE 81 MG EC tablet Take 81 mg by mouth daily.    . calcium citrate-vitamin D (CITRACAL+D) 315-200 MG-UNIT tablet Take 1 tablet by mouth daily.    . cetirizine (ZYRTEC) 10 MG tablet Take 10 mg by mouth daily.    . clopidogrel (PLAVIX) 75 MG tablet Take 1 tablet (75 mg total) by mouth daily. 60 tablet 3  . D-5000 125 MCG (5000 UT) TABS Take 1 tablet by mouth daily.    . diphenoxylate-atropine (LOMOTIL) 2.5-0.025 MG tablet Take 1 tablet by mouth every 6 (six) hours as needed for diarrhea or loose stools.    . docusate sodium (COLACE) 100 MG capsule Take 200 mg by mouth 2 (two) times daily.    . furosemide (LASIX) 20 MG tablet Take 20 mg by mouth daily.    Marland Kitchen gabapentin (NEURONTIN) 100 MG capsule Take 100 mg by mouth daily.    Marland Kitchen HYDROcodone-acetaminophen (NORCO) 7.5-325 MG tablet Take 1-2 tablets by mouth every 6 (six) hours as needed for moderate pain or severe pain. 20 tablet 0  . insulin glargine (LANTUS) 100 UNIT/ML Solostar Pen Inject 25 Units into the skin at bedtime.    . isosorbide mononitrate (IMDUR) 30 MG 24 hr tablet Take 30 mg by mouth daily.    Marland Kitchen LINZESS 145 MCG CAPS capsule Take 145 mcg by mouth daily.    Marland Kitchen liraglutide (VICTOZA) 18 MG/3ML SOPN Inject 1.8 mg into the skin daily.    Marland Kitchen losartan (COZAAR) 25 MG tablet Take 1 tablet (25 mg total) by mouth daily.    . multivitamin (ONE-A-DAY MEN'S) TABS tablet Take 1 tablet by mouth daily.    .  pantoprazole (PROTONIX) 40 MG tablet Take 1 tablet (40 mg total) by mouth daily. 90 tablet 1  . rosuvastatin (CRESTOR) 40 MG tablet Take 40 mg by mouth daily.    . sertraline (ZOLOFT) 100 MG tablet Take 200 mg by mouth daily.    . tamsulosin (FLOMAX) 0.4 MG CAPS capsule Take 0.4 mg by mouth daily.      There are no Patient Instructions on file for this visit. No follow-ups on file.   Kris Hartmann, NP

## 2021-03-24 NOTE — Progress Notes (Signed)
Physical Therapy Treatment Patient Details Name: Nathan Singleton MRN: [id number] DOB: 1940/12/17 Today's Date: 03/24/2021    History of Present Illness Nathan Singleton is a 29yoM who comes [facility] on 03/[facility] c dizziness and worsening Right foot pain- pt admitted with cellulitis of the right hallux.  Pt is s/p surgical debridement, arhtroplasty c podiaty 3/8. Pt taken back to OR with podiatry on 4/7 for hallux amputation. Patient orders for AMB in surgical shoe, heel weight bearing. PMH: poorly controlled DM2, aortic stenosis s/p TAVR, SSS s/p PM, HTN, chronic anemia. 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 No prior falls history.    PT Comments    Patient received in bed, he reports he just received pain medicine. Patient was at 7 but is starting to come down a little. Patient's significant other in room at time of session. Patient performed bed mobility with mod independence, transfers with min guard assist from slightly elevated bed. He ambulated in room approx 40 feet with RW with PWB on right and post op show donned. Reviewed heel weight bearing. Patient verbalized understanding. Patient continues to benefit from skilled PT while here to improve strength and functional independence.     Follow Up Recommendations  Home health PT;Supervision for mobility/OOB     Equipment Recommendations  None recommended by PT    Recommendations for Other Services       Precautions / Restrictions Precautions Precautions: ICD/Pacemaker;Fall Other Brace: post-op shoe Restrictions Weight Bearing Restrictions: Yes RLE Weight Bearing: Partial weight bearing RLE Partial Weight Bearing Percentage or Pounds: heel weight bearing    Mobility  Bed Mobility Overal bed mobility: Modified Independent Bed Mobility: Supine to Sit;Sit to Supine     Supine to sit: Modified independent (Device/Increase time) Sit to supine: Modified independent (Device/Increase time)         Transfers Overall transfer level: Needs assistance Equipment used: Rolling walker (2 wheeled) Transfers: Sit to/from Stand Sit to Stand: Min guard;From elevated surface            Ambulation/Gait Ambulation/Gait assistance: Min guard Gait Distance (Feet): 40 Feet Assistive device: Rolling walker (2 wheeled) Gait Pattern/deviations: Step-to pattern;Decreased weight shift to right;Decreased stance time - right Gait velocity: decreased   General Gait Details: Wearing post-op shoe, reviewed WB precautions - pt verbalized and demonstrated understanding of PWB precautions. Pt's ambulation was limited today by pt's pain tolerance.   Stairs             Wheelchair Mobility    Modified Rankin (Stroke Patients Only)       Balance Overall balance assessment: Needs assistance Sitting-balance support: Feet supported Sitting balance-Leahy Scale: Normal Sitting balance - Comments: Pt safe seated on the EOB   Standing balance support: Bilateral upper extremity supported;During functional activity Standing balance-Leahy Scale: Fair Standing balance comment: Decreased stability in the standing position 2/2 toe amputation and subsequent change in LE positioning/WB                            Cognition Arousal/Alertness: Awake/alert Behavior During Therapy: WFL for tasks assessed/performed Overall Cognitive Status: Within Functional Limits for tasks assessed                                 General Comments: Pt alert and able to follow all commands      Exercises Other Exercises Other  Exercises: R LE exercises: Seated LAQ, Marching, Supine AP, SLR, hip abd/add x 10 reps each    General Comments        Pertinent Vitals/Pain Pain Assessment: 0-10 Pain Score: 7  Pain Descriptors / Indicators: Aching;Throbbing Pain Intervention(s): Monitored during session;Premedicated before session;Repositioned    Home Living                       Prior Function            PT Goals (current goals can now be found in the care plan section) Acute Rehab PT Goals Patient Stated Goal: walk without pain PT Goal Formulation: With patient/family Time For Goal Achievement: 04/04/21 Potential to Achieve Goals: Good Progress towards PT goals: Progressing toward goals    Frequency    Min 2X/week      PT Plan Frequency needs to be updated    Co-evaluation              AM-PAC PT "6 Clicks" Mobility   Outcome Measure  Help needed turning from your back to your side while in a flat bed without using bedrails?: None Help needed moving from lying on your back to sitting on the side of a flat bed without using bedrails?: A Little Help needed moving to and from a bed to a chair (including a wheelchair)?: A Little Help needed standing up from a chair using your arms (e.g., wheelchair or bedside chair)?: A Little Help needed to walk in hospital room?: A Little Help needed climbing 3-5 steps with a railing? : A Little 6 Click Score: 19    End of Session Equipment Utilized During Treatment: Gait belt Activity Tolerance: Patient tolerated treatment well;Patient limited by pain Patient left: in bed;with call bell/phone within reach;with bed alarm set;with family/visitor present Nurse Communication: Mobility status PT Visit Diagnosis: Other abnormalities of gait and mobility (R26.89);Difficulty in walking, not elsewhere classified (R26.2);Unsteadiness on feet (R26.81);Muscle weakness (generalized) (M62.81);Pain Pain - Right/Left: Right Pain - part of body: Ankle and joints of foot     Time: 1100-1123 PT Time Calculation (min) (ACUTE ONLY): 23 min  Charges:  $Gait Training: 8-22 mins $Therapeutic Exercise: 8-22 mins                     [employer], PT, [employer] 03/24/21,12:37 PM

## 2021-03-24 NOTE — Progress Notes (Signed)
*  PRELIMINARY RESULTS* Echocardiogram 2D Echocardiogram has been performed.  Nathan Singleton 03/24/2021, 9:42 AM

## 2021-03-24 NOTE — Progress Notes (Signed)
PROGRESS NOTE    Nathan Singleton   [id number]  DOB: Jul 09, 1941  PCP: Ranae Plumber, PA    DOA: 03/20/2021 LOS: 4   Brief Narrative   Nathan Singleton is a 80 y.o. male with medical history significant of PVD, HTN, HLD, DM, gout, s/p of TAVR with bioprosthesis, pacemaker placement, CAD, CKD-3a, BPH, thrombocytopenia, anemia, chronic pain, who presented to the ED on 03/20/21 with dizziness and worsening right foot pain in addition to swelling and progressive erythema.  Due to dizziness, pt checked BP which was 92/55.  EMS was called.   Pt hospitalized 3/3 to 3/11 due to infected diabetic foot ulcer and PVD. Underwent I&D with biopsy on 02/18/2021.  Biopsy did not show osteomyelitis. Treated with IV Vancomycin, Rocephin and Flagyl and discharged on Keflex for 10 days. Vascular surgery was consulted for PVD. Pt underwent lower extremity angiogram and angioplasty of the SFA and percutaneous transluminal angioplasty of the right dorsalis podalic artery and distal anterior tibial artery. Started on aspirin and Plavix.  Xray of Right Foot in the ED showed:  1. Interval right great toe osteotomy about the IP joint which was resected. No superimposed plain radiographic evidence of osteomyelitis at this time. 2. Generalized soft tissue swelling. Calcified peripheral vascular disease.   Admitted to hospitalist service.  Dr. Cleda Mccreedy of podiatry consulted.  Patient underwent amputation of the Right great toe on evening of 03/20/21.  On IV antibiotics with surgical culture / path pending. Infectious disease is following.    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 S/p amputation on 03/20/21 with Dr. Cleda Mccreedy. --Consults: Podiatry, Vascular, Infectious Disease - see their recs --Antibiotics: Vanc/Flagyl,/Rocephin >> Ancef --Follow blood and operative cultures/path --Off IV fluids --Pain control as needed --Bowel regimen --PT evaluation - HH PT and OT recommended  Dizziness -Improved with IV hydration.  Most likely due to soft BP, dehydration.  No focal neurologic findings on exam are reported by patient. --Get orthostatic vitals if he's symptomatic --PT and OT eval's  MSSA Bacteremia- one of four bottles with +MSSA, ?contaminant vs hematogenous spread of foot infection. --ID following --Repeat blood culture ordered, follow --TTE done this AM, follow results --if TTE negative, will need TEE due to bioprosthetic valve and pacemaker in place, higher risk for endocarditis).   --PICC line once repeat cultures negative x 48 hours  Essential hypertension -antihypertensives held on admission due to soft BP.  Monitor BP and resume meds when indicated.  Insulin-dependent type 2 diabetes -Home regimen is Lantus 25 units and Victoza.  Initially placed on 15 units Lantus and sliding scale.   CBG's uncontrolled. --Increase Lantus to home dose 25 units qHS --Increase NovoLog to 10  units 3 TID WC --Continue sliding scale NovoLog --Hold Victoza  Hyperglycemia - CBG's in 200's today, insulin adjusted as above.  CKD stage IIIa -renal function stable.  Monitor BMP.  Peripheral vascular disease -with recent right lower extremity angioplasty as above. 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  Higher risk for endocarditis with bacteremia.  Echo pending.  S/p TAVR with  Bioprosthetic Aortic Valve - Higher risk for endocarditis with bacteremia.   Echo as above.  Thrombocytopenia -chronic, stable.  No signs of bleeding. --Follow CBC --Monitor for signs of bleeding  Normocytic anemia -with hemoglobin stable 9's --Monitor CBC  COPD -stable without exacerbation symptoms at this time --Albuterol as needed  Depression -continue Zoloft  GERD without esophagitis -continue PPI  Gout -continue allopurinol  Hyperlipidemia -continue Crestor  Obesity: Body mass index is 31.46 kg/m.  Complicates overall care and prognosis.  Recommend lifestyle modifications including physical activity and diet for weight loss and overall long-term health.   DVT prophylaxis: SCDs Start: 03/20/21 0730   Diet:  Diet Orders (From admission, onward)    Start     Ordered   03/20/21 2123  Diet Carb Modified Fluid consistency: Thin; Room service appropriate? Yes  Diet effective now       Question Answer Comment  Calorie Level Medium 1600-2000   Fluid consistency: Thin   Room service appropriate? Yes      03/20/21 2122            Code Status: Full Code    Subjective 03/24/21    Patient awake laying in bed. Says pain overall controlled.  Hurts at times.  Mild cough no worse that baseline cough that comes and goes. No fever chills CP or SOB.   Disposition Plan & Communication   Status is: Inpatient  Inpatient status appropriate due to severity of illness requiring IV therapies and ongoing evaluation awaiting surgical cultures and pathology  Dispo: The patient is from: Home              Anticipated d/c is to: Home              Patient currently not medically stable for discharge   Difficult to place patient no   Consults, Procedures, Significant Events   Consultants:   Podiatry  Infectious disease  Procedures:   Right great toe amputation on 03/20/2021  Antimicrobials:  Anti-infectives (From admission, onward)   Start     Dose/Rate Route Frequency  Ordered Stop   03/21/21 2200  ceFAZolin (ANCEF) IVPB 2g/100 mL premix        2 g 200 mL/hr over 30 Minutes Intravenous Every 8 hours 03/21/21 1130     03/21/21 0800  vancomycin (VANCOREADY) IVPB 1000 mg/200 mL  Status:  Discontinued        1,000 mg 200 mL/hr over 60 Minutes Intravenous Every 24 hours 03/20/21 1515 03/21/21 1130   03/20/21 0800  cefTRIAXone (ROCEPHIN) 2 g in sodium chloride 0.9 % 100 mL IVPB  Status:  Discontinued        2 g 200 mL/hr over 30 Minutes Intravenous Every 24 hours 03/20/21 0727 03/21/21 1130   03/20/21 0800  metroNIDAZOLE (FLAGYL) IVPB 500 mg  Status:  Discontinued        500 mg 100 mL/hr over 60 Minutes Intravenous Every 8 hours 03/20/21 0727 03/21/21 1130   03/20/21 0800  vancomycin (VANCOCIN) IVPB 1000 mg/200 mL premix  Status:  Discontinued        1,000 mg 200 mL/hr over 60 Minutes Intravenous Every 24 hours 03/20/21 0732 03/20/21 1515   03/20/21 0400  piperacillin-tazobactam (ZOSYN) IVPB 3.375 g        3.375 g 100  mL/hr over 30 Minutes Intravenous  Once 03/20/21 0353 03/20/21 0444   03/20/21 0400  vancomycin (VANCOCIN) IVPB 1000 mg/200 mL premix        1,000 mg 200 mL/hr over 60 Minutes Intravenous  Once 03/20/21 0353 03/20/21 0552        Micro    Objective   Vitals:   03/24/21 0948 03/24/21 0948 03/24/21 1608 03/24/21 1917  BP: (!) 137/103 (!) 137/103 (!) 158/72 135/89  Pulse:  60 (!) 59 (!) 59  Resp:   19 18  Temp:   98.3 F (36.8 C) 98.1 F (36.7 C)  TempSrc:      SpO2:   99% 100%  Weight:      Height:        Intake/Output Summary (Last 24 hours) at 03/24/2021 1928 Last data filed at 03/24/2021 1342 Gross per 24 hour  Intake 840 ml  Output --  Net 840 ml   Filed Weights   03/20/21 0332  Weight: 105.2 kg    Physical Exam:  General exam: awake, alert, no acute distress, laying in bed Respiratory system: CTAB, no wheezes or rhonchi, on room air, normal respiratory effort Cardiovascular system: normal S1/S2, RRR, no  edema Extremities: Right foot Ace bandage intact, no cyanosis, normal tone   Labs   Data Reviewed: I have personally reviewed following labs and imaging studies  CBC: Recent Labs  Lab 03/20/21 0346 03/21/21 0427 03/22/21 0451 03/23/21 0446 03/24/21 0540  WBC 6.9 6.4 5.1 4.0 3.7*  NEUTROABS 5.5  --   --   --   --   HGB 9.6* 9.0* 8.2* 8.0* 8.4*  HCT 28.0* 27.0* 24.6* 24.4* 24.9*  MCV 91.5 91.2 92.8 92.8 91.5  PLT 95* 84* 84* 96* [id number]*   Basic Metabolic Panel: Recent Labs  Lab 03/20/21 0346 03/21/21 0427 03/22/21 0451 03/23/21 0446 03/24/21 0540  NA 136 136 134* 136 138  K 4.4 4.0 4.2 4.6 4.6  CL 110 110 104 106 107  CO2 18* 19* '23 23 24  '$ GLUCOSE 172* 252* 262* 185* 107*  BUN 48* 38* 41* 42* 53*  CREATININE 1.45* 1.45* 1.49* 1.47* 1.54*  CALCIUM 8.7* 8.5* 8.2* 8.4* 8.6*  MG  --   --   --   --  2.2   GFR: Estimated Creatinine Clearance: 49.5 mL/min (A) (by C-G formula based on SCr of 1.54 mg/dL (H)). Liver Function Tests: Recent Labs  Lab 03/20/21 0346  AST 37  ALT 38  ALKPHOS 95  BILITOT 0.6  PROT 7.0  ALBUMIN 3.8   No results for input(s): LIPASE, AMYLASE in the last 168 hours. No results for input(s): AMMONIA in the last 168 hours. Coagulation Profile: Recent Labs  Lab 03/20/21 0346  INR 1.2   Cardiac Enzymes: No results for input(s): CKTOTAL, CKMB, CKMBINDEX, TROPONINI in the last 168 hours. BNP (last 3 results) No results for input(s): PROBNP in the last 8760 hours. HbA1C: No results for input(s): HGBA1C in the last 72 hours. CBG: Recent Labs  Lab 03/23/21 1609 03/23/21 2135 03/24/21 0745 03/24/21 1143 03/24/21 1619  GLUCAP 127* 93 122* 191* 170*   Lipid Profile: No results for input(s): CHOL, HDL, LDLCALC, TRIG, CHOLHDL, LDLDIRECT in the last 72 hours. Thyroid Function Tests: No results for input(s): TSH, T4TOTAL, FREET4, T3FREE, THYROIDAB in the last 72 hours. Anemia Panel: No results for input(s): VITAMINB12, FOLATE, FERRITIN, TIBC,  IRON, RETICCTPCT in the last 72 hours. Sepsis Labs: Recent Labs  Lab 03/20/21 0346 03/20/21 0352 03/21/21  [id number] 03/22/21 0451  PROCALCITON 0.61  --  0.58 0.49  LATICACIDVEN  --  0.9  --   --     Recent Results (from the past 240 hour(s))  Blood culture (routine x 2)     Status: Abnormal   Collection Time: 03/20/21  3:52 AM   Specimen: BLOOD LEFT FOREARM  Result Value Ref Range Status   Specimen Description   Final    BLOOD LEFT FOREARM Performed at [facility], [place] [address], [place], [place] [address]    Special Requests   Final    BOTTLES DRAWN AEROBIC AND ANAEROBIC Blood Culture adequate volume Performed at [facility], [address] [place], [place] [address]    Culture  Setup Time   Final    AEROBIC BOTTLE ONLY GRAM POSITIVE COCCI Organism ID to follow CRITICAL RESULT CALLED TO, READ BACK BY AND VERIFIED WITH: ALEX CHAPPELL PHARMD 2225 03/20/21 HNM Performed at [facility], [address] [place], [place] [address]    Culture STAPHYLOCOCCUS AUREUS (A)  Final   Report Status 03/23/2021 FINAL  Final   Organism ID, Bacteria STAPHYLOCOCCUS AUREUS  Final      Susceptibility   Staphylococcus aureus - MIC*    CIPROFLOXACIN <=0.5 SENSITIVE Sensitive     ERYTHROMYCIN RESISTANT Resistant     GENTAMICIN <=0.5 SENSITIVE Sensitive     OXACILLIN 0.5 SENSITIVE Sensitive     TETRACYCLINE <=1 SENSITIVE Sensitive     VANCOMYCIN <=0.5 SENSITIVE Sensitive     TRIMETH/SULFA <=10 SENSITIVE Sensitive     CLINDAMYCIN RESISTANT Resistant     RIFAMPIN <=0.5 SENSITIVE Sensitive     Inducible Clindamycin POSITIVE Resistant     * STAPHYLOCOCCUS AUREUS  Blood Culture ID Panel (Reflexed)     Status: Abnormal   Collection Time: 03/20/21  3:52 AM  Result Value Ref Range Status   Enterococcus faecalis NOT DETECTED NOT DETECTED Final   Enterococcus Faecium NOT DETECTED NOT DETECTED Final   Listeria monocytogenes NOT DETECTED NOT DETECTED Final   Staphylococcus  species DETECTED (A) NOT DETECTED Final    Comment: CRITICAL RESULT CALLED TO, READ BACK BY AND VERIFIED WITH: ALEX CHAPPELL PHARMD 2225 03/20/21 HNM    Staphylococcus aureus (BCID) DETECTED (A) NOT DETECTED Final    Comment: CRITICAL RESULT CALLED TO, READ BACK BY AND VERIFIED WITH: Sac [id number] 03/20/21 HNM    Staphylococcus epidermidis NOT DETECTED NOT DETECTED Final   Staphylococcus lugdunensis NOT DETECTED NOT DETECTED Final   Streptococcus species NOT DETECTED NOT DETECTED Final   Streptococcus agalactiae NOT DETECTED NOT DETECTED Final   Streptococcus pneumoniae NOT DETECTED NOT DETECTED Final   Streptococcus pyogenes NOT DETECTED NOT DETECTED Final   A.calcoaceticus-baumannii NOT DETECTED NOT DETECTED Final   Bacteroides fragilis NOT DETECTED NOT DETECTED Final   Enterobacterales NOT DETECTED NOT DETECTED Final   Enterobacter cloacae complex NOT DETECTED NOT DETECTED Final   Escherichia coli NOT DETECTED NOT DETECTED Final   Klebsiella aerogenes NOT DETECTED NOT DETECTED Final   Klebsiella oxytoca NOT DETECTED NOT DETECTED Final   Klebsiella pneumoniae NOT DETECTED NOT DETECTED Final   Proteus species NOT DETECTED NOT DETECTED Final   Salmonella species NOT DETECTED NOT DETECTED Final   Serratia marcescens NOT DETECTED NOT DETECTED Final   Haemophilus influenzae NOT DETECTED NOT DETECTED Final   Neisseria meningitidis NOT DETECTED NOT DETECTED Final   Pseudomonas aeruginosa NOT DETECTED NOT DETECTED Final   Stenotrophomonas maltophilia NOT DETECTED NOT DETECTED Final  Candida albicans NOT DETECTED NOT DETECTED Final   Candida auris NOT DETECTED NOT DETECTED Final   Candida glabrata NOT DETECTED NOT DETECTED Final   Candida krusei NOT DETECTED NOT DETECTED Final   Candida parapsilosis NOT DETECTED NOT DETECTED Final   Candida tropicalis NOT DETECTED NOT DETECTED Final   Cryptococcus neoformans/gattii NOT DETECTED NOT DETECTED Final   Meth resistant mecA/C and MREJ  NOT DETECTED NOT DETECTED Final    Comment: Performed at [facility], [address] [place], [place] [address]  Resp Panel by RT-PCR (Flu A&B, Covid) Nasopharyngeal Swab     Status: None   Collection Time: 03/20/21  4:01 AM   Specimen: Nasopharyngeal Swab; Nasopharyngeal(NP) swabs in vial transport medium  Result Value Ref Range Status   SARS Coronavirus 2 by RT PCR NEGATIVE NEGATIVE Final    Comment: (NOTE) SARS-CoV-2 target nucleic acids are NOT DETECTED.  The SARS-CoV-2 RNA is generally detectable in upper respiratory specimens during the acute phase of infection. The lowest concentration of SARS-CoV-2 viral copies this assay can detect is 138 copies/mL. A negative result does not preclude SARS-Cov-2 infection and should not be used as the sole basis for treatment or other patient management decisions. A negative result may occur with  improper specimen collection/handling, submission of specimen other than nasopharyngeal swab, presence of viral mutation(s) within the areas targeted by this assay, and inadequate number of viral copies(<138 copies/mL). A negative result must be combined with clinical observations, patient history, and epidemiological information. The expected result is Negative.  Fact Sheet for Patients:  EntrepreneurPulse.com.au  Fact Sheet for Healthcare Providers:  IncredibleEmployment.be  This test is no t yet approved or cleared by the [place] FDA and  has been authorized for detection and/or diagnosis of SARS-CoV-2 by FDA under an Emergency Use Authorization (EUA). This EUA will remain  in effect (meaning this test can be used) for the duration of the COVID-19 declaration under Section 564(b)(1) of the Act, 21 U.S.C.section 360bbb-3(b)(1), unless the authorization is terminated  or revoked sooner.       Influenza A by PCR NEGATIVE NEGATIVE Final   Influenza B by PCR NEGATIVE NEGATIVE Final     Comment: (NOTE) The Xpert Xpress SARS-CoV-2/FLU/RSV plus assay is intended as an aid in the diagnosis of influenza from Nasopharyngeal swab specimens and should not be used as a sole basis for treatment. Nasal washings and aspirates are unacceptable for Xpert Xpress SARS-CoV-2/FLU/RSV testing.  Fact Sheet for Patients: EntrepreneurPulse.com.au  Fact Sheet for Healthcare Providers: IncredibleEmployment.be  This test is not yet approved or cleared by the [place] FDA and has been authorized for detection and/or diagnosis of SARS-CoV-2 by FDA under an Emergency Use Authorization (EUA). This EUA will remain in effect (meaning this test can be used) for the duration of the COVID-19 declaration under Section 564(b)(1) of the Act, 21 U.S.C. section 360bbb-3(b)(1), unless the authorization is terminated or revoked.  Performed at [facility], [address] [place], [place] [address]   Blood culture (routine x 2)     Status: None (Preliminary result)   Collection Time: 03/20/21 11:43 AM   Specimen: BLOOD  Result Value Ref Range Status   Specimen Description BLOOD WRIST  Final   Special Requests   Final    BOTTLES DRAWN AEROBIC AND ANAEROBIC Blood Culture results may not be optimal due to an excessive volume of blood received in culture bottles   Culture   Final    NO GROWTH 4 DAYS  Performed at [facility], [address] [place], [place] [address]    Report Status PENDING  Incomplete  Aerobic/Anaerobic Culture w Gram Stain (surgical/deep wound)     Status: None (Preliminary result)   Collection Time: 03/20/21  8:41 PM   Specimen: PATH Other; Tissue  Result Value Ref Range Status   Specimen Description   Final    TISSUE Performed at [facility] [address] [place], [place] [address]    Special Requests   Final    RIGHT GREAT TOE Performed at [facility], [address] [place], [place]  [address]    Gram Stain   Final    ABUNDANT WBC PRESENT,BOTH PMN AND MONONUCLEAR FEW GRAM POSITIVE COCCI Performed at [facility], [place] [address] [place], [place] [address]    Culture   Final    ABUNDANT STAPHYLOCOCCUS AUREUS NO ANAEROBES ISOLATED; CULTURE IN PROGRESS FOR 5 DAYS    Report Status PENDING  Incomplete   Organism ID, Bacteria STAPHYLOCOCCUS AUREUS  Final      Susceptibility   Staphylococcus aureus - MIC*    CIPROFLOXACIN <=0.5 SENSITIVE Sensitive     ERYTHROMYCIN >=8 RESISTANT Resistant     GENTAMICIN <=0.5 SENSITIVE Sensitive     OXACILLIN 0.5 SENSITIVE Sensitive     TETRACYCLINE <=1 SENSITIVE Sensitive     VANCOMYCIN <=0.5 SENSITIVE Sensitive     TRIMETH/SULFA <=10 SENSITIVE Sensitive     CLINDAMYCIN RESISTANT Resistant     RIFAMPIN <=0.5 SENSITIVE Sensitive     Inducible Clindamycin POSITIVE Resistant     * ABUNDANT STAPHYLOCOCCUS AUREUS  Culture, blood (single) w Reflex to ID Panel     Status: None (Preliminary result)   Collection Time: 03/22/21  4:51 AM   Specimen: BLOOD  Result Value Ref Range Status   Specimen Description BLOOD  RIGHT ARM  Final   Special Requests   Final    BOTTLES DRAWN AEROBIC AND ANAEROBIC Blood Culture results may not be optimal due to an excessive volume of blood received in culture bottles   Culture   Final    NO GROWTH 2 DAYS Performed at [facility], [address], [place],  [address]    Report Status PENDING  Incomplete      Imaging Studies   ECHOCARDIOGRAM COMPLETE  Result Date: 03/24/2021    ECHOCARDIOGRAM REPORT   Patient Name:   Nathan Singleton Date of Exam: 03/24/2021 Medical Rec #:  [id number]      Height:       72.0 in Accession #:    [id number]     Weight:       232.0 lb Date of Birth:  1941-08-24      BSA:          2.269 m Patient Age:    29 years       BP:           159/71 mmHg Patient Gender: M              HR:           63 bpm. Exam Location:  [facility] Procedure: 2D Echo, Color Doppler and Cardiac  Doppler Indications:     R78.81 Bacteremia  History:         Patient has no prior history of Echocardiogram examinations.                  CAD, Defibrillator, PVD, S/P TAVR; Risk Factors:Hypertension  and Diabetes.  Sonographer:     Charmayne Sheer RDCS (AE) Referring Phys:  [place] Diagnosing Phys: Nelva Bush Singleton  Sonographer Comments: Technically challenging study due to limited acoustic windows. IMPRESSIONS  1. Left ventricular ejection fraction, by estimation, is >55%. The left ventricle has normal function. Left ventricular endocardial border not optimally defined to evaluate regional wall motion. Left ventricular diastolic parameters are indeterminate.  2. Right ventricular systolic function is normal. The right ventricular size is normal.  3. Left atrial size was mildly dilated.  4. The mitral valve was not well visualized. Mild mitral valve regurgitation. No evidence of mitral stenosis.  5. The aortic valve was not well visualized. Aortic valve regurgitation is not visualized. Mild aortic valve stenosis. Aortic valve mean gradient measures 16.0 mmHg.  6. The inferior vena cava is dilated in size with <50% respiratory variability, suggesting right atrial pressure of 15 mmHg. Conclusion(s)/Recommendation(s): No definite evidence of endocarditis. However, acoustic windows are suboptimal and transesophageal echocardiogram should be considered in the setting of implanted cardiac devices and MSSA bacteremia. FINDINGS  Left Ventricle: LV size and wall thickness not well assessed due to suboptimal acoustic windows. Left ventricular ejection fraction, by estimation, is >55%. The left ventricle has normal function. Left ventricular endocardial border not optimally defined to evaluate regional wall motion. Left ventricular diastolic parameters are indeterminate. Right Ventricle: The right ventricular size is normal. Right vetricular wall thickness was not well visualized. Right  ventricular systolic function is normal. Left Atrium: Left atrial size was mildly dilated. Right Atrium: Right atrial size was normal in size. Pericardium: There is no evidence of pericardial effusion. Mitral Valve: The mitral valve was not well visualized. Mild mitral annular calcification. Mild mitral valve regurgitation. No evidence of mitral valve stenosis. MV peak gradient, 6.2 mmHg. The mean mitral valve gradient is 2.0 mmHg. Tricuspid Valve: The tricuspid valve is grossly normal. Tricuspid valve regurgitation is not demonstrated. Aortic Valve: The aortic valve was not well visualized. Aortic valve regurgitation is not visualized. Mild aortic stenosis is present. Aortic valve mean gradient measures 16.0 mmHg. Aortic valve peak gradient measures 27.2 mmHg. Aortic valve area, by VTI  measures 2.40 cm. There is a 29 mm Sapien prosthetic, stented (TAVR) valve present in the aortic position. Pulmonic Valve: The pulmonic valve was not well visualized. Pulmonic valve regurgitation is not visualized. No evidence of pulmonic stenosis. Aorta: The aortic root is normal in size and structure. Pulmonary Artery: The pulmonary artery is not well seen. Venous: The inferior vena cava is dilated in size with less than 50% respiratory variability, suggesting right atrial pressure of 15 mmHg. IAS/Shunts: The interatrial septum was not well visualized. Additional Comments: A device lead is visualized in the right ventricle.  LEFT VENTRICLE PLAX 2D LVIDd:         5.10 cm      Diastology LVIDs:         3.00 cm      LV e' medial:    9.25 cm/s LV PW:         0.90 cm      LV E/e' medial:  13.3 LV IVS:        0.70 cm      LV e' lateral:   8.70 cm/s LVOT diam:     2.30 cm      LV E/e' lateral: 14.1 LV SV:         141 LV SV Index:   62 LVOT Area:  4.15 cm  LV Volumes (MOD) LV vol d, MOD A4C: 136.0 ml LV vol s, MOD A4C: 61.7 ml LV SV MOD A4C:     136.0 ml RIGHT VENTRICLE RV Basal diam:  3.20 cm LEFT ATRIUM             Index        RIGHT ATRIUM           Index LA diam:        4.70 cm 2.07 cm/m  RA Area:     17.80 cm LA Vol (A2C):   84.6 ml 37.28 ml/m RA Volume:   48.40 ml  21.33 ml/m LA Vol (A4C):   78.7 ml 34.68 ml/m LA Biplane Vol: 86.4 ml 38.07 ml/m  AORTIC VALVE                    PULMONIC VALVE AV Area (Vmax):    2.32 cm     PV Vmax:       0.76 m/s AV Area (Vmean):   2.30 cm     PV Vmean:      49.300 cm/s AV Area (VTI):     2.40 cm     PV VTI:        0.137 m AV Vmax:           261.00 cm/s  PV Peak grad:  2.3 mmHg AV Vmean:          190.000 cm/s PV Mean grad:  1.0 mmHg AV VTI:            0.588 m AV Peak Grad:      27.2 mmHg AV Mean Grad:      16.0 mmHg LVOT Vmax:         146.00 cm/s LVOT Vmean:        105.000 cm/s LVOT VTI:          0.340 m LVOT/AV VTI ratio: 0.58  AORTA Ao Root diam: 2.30 cm MITRAL VALVE MV Area (PHT): 2.68 cm     SHUNTS MV Area VTI:   3.54 cm     Systemic VTI:  0.34 m MV Peak grad:  6.2 mmHg     Systemic Diam: 2.30 cm MV Mean grad:  2.0 mmHg MV Vmax:       1.25 m/s MV Vmean:      69.1 cm/s MV Decel Time: 283 msec MV E velocity: 123.00 cm/s MV A velocity: 110.00 cm/s MV E/A ratio:  1.12 Nathan Singleton Electronically signed by Nelva Bush Singleton Signature Date/Time: 03/24/2021/3:51:01 PM    Final    [place] EKG SITE RITE  Result Date: 03/24/2021 If Site Rite image not attached, placement could not be confirmed due to current cardiac rhythm.    Medications   Scheduled Meds: . allopurinol  100 mg Oral Daily  . amLODipine  10 mg Oral Daily  . aspirin EC  81 mg Oral Daily  . baclofen  10 mg Oral BID  . carvedilol  6.25 mg Oral QHS  . Chlorhexidine Gluconate Cloth  6 each Topical Daily  . cholecalciferol  5,000 Units Oral Daily  . clopidogrel  75 mg Oral Daily  . docusate sodium  200 mg Oral BID  . furosemide  20 mg Oral Daily  . gabapentin  100 mg Oral Daily  . insulin aspart  0-5 Units Subcutaneous QHS  . insulin aspart  0-9 Units Subcutaneous TID WC  . insulin aspart  10 Units Subcutaneous  TID  WC  . insulin glargine  25 Units Subcutaneous QHS  . isosorbide mononitrate  30 mg Oral Daily  . linaclotide  145 mcg Oral Daily  . loratadine  10 mg Oral Daily  . multivitamin with minerals  1 tablet Oral Daily  . pantoprazole  40 mg Oral Daily  . rosuvastatin  40 mg Oral Daily  . sertraline  200 mg Oral Daily  . sodium chloride flush  10-40 mL Intracatheter Q12H  . tamsulosin  0.4 mg Oral Daily   Continuous Infusions: . sodium chloride 10 mL/hr at 03/22/21 0534  .  ceFAZolin (ANCEF) IV 2 g (03/24/21 1303)       LOS: 4 days    Time spent: 20 minutes    Ezekiel Slocumb, DO [facility]  03/24/2021, 7:28 PM      If 7PM-7AM, please contact night-coverage. How to contact the [facility] Attending or Consulting provider [place] or covering provider during after hours [place], for this patient?    1. Check the care team in [facility] and look for a) attending/consulting TRH provider listed and b) the [facility] team listed 2. Log into [email or web address] and use [place]'s universal password to access. If you do not have the password, please contact the hospital operator. 3. Locate the [facility] provider you are looking for under [facility] and page to a number that you can be directly reached. 4. If you still have difficulty reaching the provider, please page the [facility] (Director on Call) for the Hospitalists listed on amion for assistance.

## 2021-03-24 NOTE — Progress Notes (Signed)
4 Days Post-Op   Subjective/Chief Complaint: Patient seen.  Overall doing fairly well.   Objective: Vital signs in last 24 hours: Temp:  [98.3 F (36.8 C)-99.9 F (37.7 C)] 98.3 F (36.8 C) (04/11 0745) Pulse Rate:  [59-68] 60 (04/11 0948) Resp:  [16-18] 18 (04/11 0745) BP: (127-172)/(67-103) 137/103 (04/11 0948) SpO2:  [99 %-100 %] 100 % (04/11 0745) Last BM Date: 03/23/21  Intake/Output from previous day: 04/10 0701 - 04/11 0700 In: 680 [P.O.:480; IV Piggyback:200] Out: -  Intake/Output this shift: Total I/O In: 480 [P.O.:480] Out: -   Bandages dry and intact.  Upon removal there is still some mild bleeding and drainage mostly from the proximal dorsal wound.  Incision appears to be fairly well coapted but there does appear to be some mildly increased early incisional necrosis type appearance.  No expressible fluid is noted.    Lab Results:  Recent Labs    03/23/21 0446 03/24/21 0540  WBC 4.0 3.7*  HGB 8.0* 8.4*  HCT 24.4* 24.9*  PLT 96* 118*   BMET Recent Labs    03/23/21 0446 03/24/21 0540  NA 136 138  K 4.6 4.6  CL 106 107  CO2 23 24  GLUCOSE 185* 107*  BUN 42* 53*  CREATININE 1.47* 1.54*  CALCIUM 8.4* 8.6*   PT/INR No results for input(s): LABPROT, INR in the last 72 hours. ABG No results for input(s): PHART, HCO3 in the last 72 hours.  Invalid input(s): PCO2, PO2  Studies/Results: [place] EKG SITE RITE  Result Date: 03/24/2021 If Site Rite image not attached, placement could not be confirmed due to current cardiac rhythm.   Anti-infectives: Anti-infectives (From admission, onward)   Start     Dose/Rate Route Frequency Ordered Stop   03/21/21 2200  ceFAZolin (ANCEF) IVPB 2g/100 mL premix        2 g 200 mL/hr over 30 Minutes Intravenous Every 8 hours 03/21/21 1130     03/21/21 0800  vancomycin (VANCOREADY) IVPB 1000 mg/200 mL  Status:  Discontinued        1,000 mg 200 mL/hr over 60 Minutes Intravenous Every 24 hours 03/20/21 1515 03/21/21  1130   03/20/21 0800  cefTRIAXone (ROCEPHIN) 2 g in sodium chloride 0.9 % 100 mL IVPB  Status:  Discontinued        2 g 200 mL/hr over 30 Minutes Intravenous Every 24 hours 03/20/21 0727 03/21/21 1130   03/20/21 0800  metroNIDAZOLE (FLAGYL) IVPB 500 mg  Status:  Discontinued        500 mg 100 mL/hr over 60 Minutes Intravenous Every 8 hours 03/20/21 0727 03/21/21 1130   03/20/21 0800  vancomycin (VANCOCIN) IVPB 1000 mg/200 mL premix  Status:  Discontinued        1,000 mg 200 mL/hr over 60 Minutes Intravenous Every 24 hours 03/20/21 0732 03/20/21 1515   03/20/21 0400  piperacillin-tazobactam (ZOSYN) IVPB 3.375 g        3.375 g 100 mL/hr over 30 Minutes Intravenous  Once 03/20/21 0353 03/20/21 0444   03/20/21 0400  vancomycin (VANCOCIN) IVPB 1000 mg/200 mL premix        1,000 mg 200 mL/hr over 60 Minutes Intravenous  Once 03/20/21 0353 03/20/21 0552      Assessment/Plan: s/p Procedure(s): AMPUTATION TOE (Right) Assessment: Status post right great toe amputation.   Betadine and a sterile bandage reapplied to the right foot.  Discussed with the patient that with the appearance of his amputation site I would like to reconsult vascular  for reevaluation.  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  Discussed this with Dr. Lucky Cowboy this morning and distal disease has more chance of reocclusion.  Discussed with the patient that we may also need to revise his amputation site and excised some of the metatarsal head.  Would hold off on discharge considerations at this point.  LOS: 4 days    Durward Fortes 03/24/2021

## 2021-03-24 NOTE — Care Management Important Message (Signed)
Important Message  Patient Details  Name: Nathan Singleton MRN: [id number] Date of Birth: Sep 27, 1941   Medicare Important Message Given:  Yes     Dannette Barbara 03/24/2021, 12:03 PM

## 2021-03-24 NOTE — Progress Notes (Signed)
Infectious Disease     Reason for Consult: MSSA bacteremia   Referring Physician: Dr Arbutus Ped Date of Admission:  03/20/2021   Principal Problem:   Cellulitis of great toe, right Active Problems:   Hypertension   PVD (peripheral vascular disease) (HCC)   CAD (coronary artery disease)   Thrombocytopenia (HCC)   Normocytic anemia   COPD (chronic obstructive pulmonary disease) (HCC)   Depression   GERD without esophagitis   Gout   Hyperlipidemia   Type II diabetes mellitus with renal manifestations ([place])   CKD (chronic kidney disease), stage IIIa   Dizziness   HPI: Nathan Singleton is a 80 y.o. male with hx of PVD, HTN, HLD, DM, gout, s/p of TAVR with bioprosthesis, pacemaker placement, CAD, CKD-3, BPH, thrombocytopenia, anemia, chronic pain, who presents with dizziness and right foot pain.  Pt was recently hospitalized from 3/3 to 3/11 due to diabetic foot ulcer with infection and PVD. Pt had I&D with biopsy on 02/18/2021 biopsy, which showed negative margins for  Osteomyelitis. Bone cx neg.  Pt was treated with IV vancomycin, Rocephin and Flagyl and discharged on Keflexfor 10 days. Vascular surgery was consulted for PVD. Pt underwent lower extremity angiogram and angioplasty of the SFA and percutaneous transluminal angioplasty of the right dorsalis podalic artery and distal anterior tibial artery. He was started on aspirin and Plavix. Admitted now with worsening pain, swelling and redness in the right great toe in the past several days. He also  had subjective fevers.  He felt like he was going to pass out, but did not. He took his blood pressure at home which was 92/55., On admit wbc 6.    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  Bcx + MSSA.   April 11th -he has been afebrile over the last few days and his white count 3.7.  He remains just on cefazolin.  Follow-up blood cultures done April 7 and 9 remain negative.  CRP was noted to be 11 sed rate 70 on April 7.  Culture from his surgery on  the seventh grew methicillin sensitive staph aureus as well.  He has been seen by surgery and there is some concern he may need further surgical debridement given peripheral vascular disease.  He has had his echocardiogram but is not read yet.  Past Medical History:  Diagnosis Date  . Aneurysm (arteriovenous) of coronary vessels   . Arthritis   . Ascending aortic aneurysm ([place])   . CAD (coronary artery disease)   . Diabetes mellitus without complication ([place])   . History of GI bleed   . Hyperlipidemia associated with type 2 diabetes mellitus ([place])   . Hypertension associated with diabetes ([place])   . Presence of permanent cardiac pacemaker   . PVD (peripheral vascular disease) ([place])   . S/P TAVR (transcatheter aortic valve replacement)    Past Surgical History:  Procedure Laterality Date  . AMPUTATION TOE Right 03/20/2021   Procedure: AMPUTATION TOE;  Surgeon: Sharlotte Alamo, DPM;  Location: [facility] ORS;  Service: Podiatry;  Laterality: Right;  . Ankle repaired Right   . Carpal Tunnel repaired Left   . CATARACT EXTRACTION Bilateral   . COLONOSCOPY    . Copillar implant     . DG CHOLECYSTOGRAPHY GALL BLADDER ([place] [facility])    . GASTRIC BYPASS    . heart valve replaced    . IRRIGATION AND DEBRIDEMENT FOOT Right 02/18/2021   Procedure: IRRIGATION AND DEBRIDEMENT FOOT-Right Great Toe;  Surgeon: [name], DPM;  Location:  [facility] ORS;  Service: Podiatry;  Laterality: Right;  . LOWER EXTREMITY ANGIOGRAPHY Right 02/17/2021   Procedure: Lower Extremity Angiography;  Surgeon: Algernon Huxley, MD;  Location: [place] [facility];  Service: Cardiovascular;  Laterality: Right;  . PACEMAKER GENERATOR CHANGE    . REPLACEMENT TOTAL KNEE BILATERAL    . RTC    . STENT PLACE LEFT URETER ([facility]) Right    Right Leg  . Toe nail removed Bilateral    Social History   Tobacco Use  . Smoking status: Former Research scientist (life sciences)  . Smokeless tobacco: Never Used  Substance Use Topics  . Alcohol use: Not Currently  . Drug use: Not  Currently   Family History  Problem Relation Age of Onset  . Heart disease Mother   . Alzheimer's disease Mother   . Heart disease Father   . Diabetes Father     Allergies:  Allergies  Allergen Reactions  . Atorvastatin Other (See Comments)    "bad for kidneys" Cramping   . Celecoxib Other (See Comments)    Other reaction(s): Other (See Comments) Kidney Problem Kidney Problem   . Dicyclomine Hcl Other (See Comments)    Stomach cramps  . Doxycycline Other (See Comments)    Per wife (per RN)- pt gets severe stomach pains even when takes with food and PCP said to not take    . Metformin Other (See Comments)    Current antibiotics: Antibiotics Given (last 72 hours)    Date/Time Action Medication Dose Rate   03/21/21 2241 New Bag/Given   ceFAZolin (ANCEF) IVPB 2g/100 mL premix 2 g 200 mL/hr   03/22/21 0536 New Bag/Given   ceFAZolin (ANCEF) IVPB 2g/100 mL premix 2 g 200 mL/hr   03/22/21 1401 New Bag/Given   ceFAZolin (ANCEF) IVPB 2g/100 mL premix 2 g 200 mL/hr   03/22/21 2119 New Bag/Given   ceFAZolin (ANCEF) IVPB 2g/100 mL premix 2 g 200 mL/hr   03/23/21 0508 New Bag/Given   ceFAZolin (ANCEF) IVPB 2g/100 mL premix 2 g 200 mL/hr   03/23/21 1346 New Bag/Given   ceFAZolin (ANCEF) IVPB 2g/100 mL premix 2 g 200 mL/hr   03/23/21 2140 New Bag/Given   ceFAZolin (ANCEF) IVPB 2g/100 mL premix 2 g 200 mL/hr   03/24/21 0557 New Bag/Given   ceFAZolin (ANCEF) IVPB 2g/100 mL premix 2 g 200 mL/hr   03/24/21 1303 New Bag/Given   ceFAZolin (ANCEF) IVPB 2g/100 mL premix 2 g 200 mL/hr      MEDICATIONS: . allopurinol  100 mg Oral Daily  . amLODipine  10 mg Oral Daily  . aspirin EC  81 mg Oral Daily  . baclofen  10 mg Oral BID  . carvedilol  6.25 mg Oral QHS  . cholecalciferol  5,000 Units Oral Daily  . clopidogrel  75 mg Oral Daily  . docusate sodium  200 mg Oral BID  . furosemide  20 mg Oral Daily  . gabapentin  100 mg Oral Daily  . insulin aspart  0-5 Units Subcutaneous QHS   . insulin aspart  0-9 Units Subcutaneous TID WC  . insulin aspart  10 Units Subcutaneous TID WC  . insulin glargine  25 Units Subcutaneous QHS  . isosorbide mononitrate  30 mg Oral Daily  . linaclotide  145 mcg Oral Daily  . loratadine  10 mg Oral Daily  . multivitamin with minerals  1 tablet Oral Daily  . pantoprazole  40 mg Oral Daily  . rosuvastatin  40 mg Oral Daily  .  sertraline  200 mg Oral Daily  . tamsulosin  0.4 mg Oral Daily    Review of Systems - 11 systems reviewed and negative per HPI   OBJECTIVE: Temp:  [98.3 F (36.8 C)-99.9 F (37.7 C)] 98.3 F (36.8 C) (04/11 0745) Pulse Rate:  [59-68] 60 (04/11 0948) Resp:  [16-18] 18 (04/11 0745) BP: (127-172)/(67-103) 137/103 (04/11 0948) SpO2:  [99 %-100 %] 100 % (04/11 0745) Physical Exam  Constitutional:  oriented to person, place, and time. appears well-developed and well-nourished. No distress. HOH HENT: [place]/AT, PERRLA, no scleral icterus Mouth/Throat: Oropharynx is clear and moist. No oropharyngeal exudate.  Cardiovascular: Normal rate, regular rhythm and normal heart sounds. Exam reveals no gallop and no friction rub.  No murmur heard.  Pulmonary/Chest: Effort normal and breath sounds normal. No respiratory distress.  has no wheezes.  Neck = supple, no nuchal rigidity Abdominal: Soft. Bowel sounds are normal.  exhibits no distension. There is no tenderness.  Lymphadenopathy: no cervical adenopathy. No axillary adenopathy Neurological: alert and oriented to person, place, and time.  Skin:foot wrapped post op Psychiatric: a normal mood and affect.  behavior is normal.    LABS: Results for orders placed or performed during the hospital encounter of 03/20/21 (from the past 48 hour(s))  Glucose, capillary     Status: Abnormal   Collection Time: 03/22/21  4:52 PM  Result Value Ref Range   Glucose-Capillary 124 (H) 70 - 99 mg/dL    Comment: Glucose reference range applies only to samples taken after fasting for at  least 8 hours.  Glucose, capillary     Status: Abnormal   Collection Time: 03/22/21  9:07 PM  Result Value Ref Range   Glucose-Capillary 154 (H) 70 - 99 mg/dL    Comment: Glucose reference range applies only to samples taken after fasting for at least 8 hours.  Basic metabolic panel     Status: Abnormal   Collection Time: 03/23/21  4:46 AM  Result Value Ref Range   Sodium 136 135 - 145 mmol/L   Potassium 4.6 3.5 - 5.1 mmol/L   Chloride 106 98 - 111 mmol/L   CO2 23 22 - 32 mmol/L   Glucose, Bld 185 (H) 70 - 99 mg/dL    Comment: Glucose reference range applies only to samples taken after fasting for at least 8 hours.   BUN 42 (H) 8 - 23 mg/dL   Creatinine, Ser 1.47 (H) 0.61 - 1.24 mg/dL   Calcium 8.4 (L) 8.9 - 10.3 mg/dL   GFR, Estimated 49 (L) >60 mL/min    Comment: (NOTE) Calculated using the CKD-EPI Creatinine Equation (2021)    Anion gap 7 5 - 15    Comment: Performed at [facility], [address] [place], [place] [address]  CBC     Status: Abnormal   Collection Time: 03/23/21  4:46 AM  Result Value Ref Range   WBC 4.0 4.0 - 10.5 K/uL   RBC 2.63 (L) 4.22 - 5.81 MIL/uL   Hemoglobin 8.0 (L) 13.0 - 17.0 g/dL   HCT 24.4 (L) 39.0 - 52.0 %   MCV 92.8 80.0 - 100.0 fL   MCH 30.4 26.0 - 34.0 pg   MCHC 32.8 30.0 - 36.0 g/dL   RDW 13.7 11.5 - 15.5 %   Platelets 96 (L) 150 - 400 K/uL    Comment: Immature Platelet Fraction may be clinically indicated, consider ordering this additional test [id number]    nRBC 0.0 0.0 - 0.2 %  Comment: Performed at [facility], [address] [place], [place] [address]  Glucose, capillary     Status: Abnormal   Collection Time: 03/23/21  8:07 AM  Result Value Ref Range   Glucose-Capillary 243 (H) 70 - 99 mg/dL    Comment: Glucose reference range applies only to samples taken after fasting for at least 8 hours.  Glucose, capillary     Status: Abnormal   Collection Time: 03/23/21  4:09 PM  Result Value Ref Range    Glucose-Capillary 127 (H) 70 - 99 mg/dL    Comment: Glucose reference range applies only to samples taken after fasting for at least 8 hours.  Glucose, capillary     Status: None   Collection Time: 03/23/21  9:35 PM  Result Value Ref Range   Glucose-Capillary 93 70 - 99 mg/dL    Comment: Glucose reference range applies only to samples taken after fasting for at least 8 hours.  Basic metabolic panel     Status: Abnormal   Collection Time: 03/24/21  5:40 AM  Result Value Ref Range   Sodium 138 135 - 145 mmol/L   Potassium 4.6 3.5 - 5.1 mmol/L   Chloride 107 98 - 111 mmol/L   CO2 24 22 - 32 mmol/L   Glucose, Bld 107 (H) 70 - 99 mg/dL    Comment: Glucose reference range applies only to samples taken after fasting for at least 8 hours.   BUN 53 (H) 8 - 23 mg/dL   Creatinine, Ser 1.54 (H) 0.61 - 1.24 mg/dL   Calcium 8.6 (L) 8.9 - 10.3 mg/dL   GFR, Estimated 46 (L) >60 mL/min    Comment: (NOTE) Calculated using the CKD-EPI Creatinine Equation (2021)    Anion gap 7 5 - 15    Comment: Performed at [facility], [address] [place], [place] [address]  CBC     Status: Abnormal   Collection Time: 03/24/21  5:40 AM  Result Value Ref Range   WBC 3.7 (L) 4.0 - 10.5 K/uL   RBC 2.72 (L) 4.22 - 5.81 MIL/uL   Hemoglobin 8.4 (L) 13.0 - 17.0 g/dL   HCT 24.9 (L) 39.0 - 52.0 %   MCV 91.5 80.0 - 100.0 fL   MCH 30.9 26.0 - 34.0 pg   MCHC 33.7 30.0 - 36.0 g/dL   RDW 13.7 11.5 - 15.5 %   Platelets 118 (L) 150 - 400 K/uL    Comment: Immature Platelet Fraction may be clinically indicated, consider ordering this additional test [id number] CONSISTENT WITH PREVIOUS RESULT REPEATED TO VERIFY    nRBC 0.0 0.0 - 0.2 %    Comment: Performed at [facility], [address] [place], [place] [address]  Magnesium     Status: None   Collection Time: 03/24/21  5:40 AM  Result Value Ref Range   Magnesium 2.2 1.7 - 2.4 mg/dL    Comment: Performed at [facility], [address] [place], [place] [address]  Glucose, capillary     Status: Abnormal   Collection Time: 03/24/21  7:45 AM  Result Value Ref Range   Glucose-Capillary 122 (H) 70 - 99 mg/dL    Comment: Glucose reference range applies only to samples taken after fasting for at least 8 hours.   Comment 1 Notify RN    Comment 2 Document in Chart   Glucose, capillary     Status: Abnormal   Collection Time: 03/24/21 11:43 AM  Result Value Ref Range  Glucose-Capillary 191 (H) 70 - 99 mg/dL    Comment: Glucose reference range applies only to samples taken after fasting for at least 8 hours.   Comment 1 Notify RN    Comment 2 Document in Chart    No components found for: ESR, C REACTIVE PROTEIN MICRO: Recent Results (from the past 720 hour(s))  SARS CORONAVIRUS 2 (TAT 6-24 HRS) Nasopharyngeal Nasopharyngeal Swab     Status: None   Collection Time: 03/07/21 12:42 PM   Specimen: Nasopharyngeal Swab  Result Value Ref Range Status   SARS Coronavirus 2 NEGATIVE NEGATIVE Final    Comment: (NOTE) SARS-CoV-2 target nucleic acids are NOT DETECTED.  The SARS-CoV-2 RNA is generally detectable in upper and lower respiratory specimens during the acute phase of infection. Negative results do not preclude SARS-CoV-2 infection, do not rule out co-infections with other pathogens, and should not be used as the sole basis for treatment or other patient management decisions. Negative results must be combined with clinical observations, patient history, and epidemiological information. The expected result is Negative.  Fact Sheet for Patients: SugarRoll.be  Fact Sheet for Healthcare Providers: https://www.woods-mathews.com/  This test is not yet approved or cleared by the [place] FDA and  has been authorized for detection and/or diagnosis of SARS-CoV-2 by FDA under an Emergency Use Authorization (EUA). This EUA will remain  in effect (meaning this test can be used) for  the duration of the COVID-19 declaration under Se ction 564(b)(1) of the Act, 21 U.S.C. section 360bbb-3(b)(1), unless the authorization is terminated or revoked sooner.  Performed at [facility], [place] [address] [place], [place] [address]   Blood culture (routine x 2)     Status: Abnormal   Collection Time: 03/20/21  3:52 AM   Specimen: BLOOD LEFT FOREARM  Result Value Ref Range Status   Specimen Description   Final    BLOOD LEFT FOREARM Performed at [facility], [place] [address], [place], [place] [address]    Special Requests   Final    BOTTLES DRAWN AEROBIC AND ANAEROBIC Blood Culture adequate volume Performed at [facility], [address] [place], [place] [address]    Culture  Setup Time   Final    AEROBIC BOTTLE ONLY GRAM POSITIVE COCCI Organism ID to follow CRITICAL RESULT CALLED TO, READ BACK BY AND VERIFIED WITH: ALEX CHAPPELL PHARMD [id number] 03/20/21 HNM Performed at [facility], [address] [place],  [address]    Culture STAPHYLOCOCCUS AUREUS (A)  Final   Report Status 03/23/2021 FINAL  Final   Organism ID, Bacteria STAPHYLOCOCCUS AUREUS  Final      Susceptibility   Staphylococcus aureus - MIC*    CIPROFLOXACIN <=0.5 SENSITIVE Sensitive     ERYTHROMYCIN RESISTANT Resistant     GENTAMICIN <=0.5 SENSITIVE Sensitive     OXACILLIN 0.5 SENSITIVE Sensitive     TETRACYCLINE <=1 SENSITIVE Sensitive     VANCOMYCIN <=0.5 SENSITIVE Sensitive     TRIMETH/SULFA <=10 SENSITIVE Sensitive     CLINDAMYCIN RESISTANT Resistant     RIFAMPIN <=0.5 SENSITIVE Sensitive     Inducible Clindamycin POSITIVE Resistant     * STAPHYLOCOCCUS AUREUS  Blood Culture ID Panel (Reflexed)     Status: Abnormal   Collection Time: 03/20/21  3:52 AM  Result Value Ref Range Status   Enterococcus faecalis NOT DETECTED NOT DETECTED Final   Enterococcus Faecium NOT DETECTED NOT DETECTED Final   Listeria monocytogenes NOT DETECTED NOT DETECTED Final  Staphylococcus species DETECTED (A) NOT DETECTED Final    Comment: CRITICAL RESULT CALLED TO, READ BACK BY AND VERIFIED WITH: ALEX CHAPPELL PHARMD [id number] 03/20/21 HNM    Staphylococcus aureus (BCID) DETECTED (A) NOT DETECTED Final    Comment: CRITICAL RESULT CALLED TO, READ BACK BY AND VERIFIED WITH: [place] [id number] 03/20/21 HNM    Staphylococcus epidermidis NOT DETECTED NOT DETECTED Final   Staphylococcus lugdunensis NOT DETECTED NOT DETECTED Final   Streptococcus species NOT DETECTED NOT DETECTED Final   Streptococcus agalactiae NOT DETECTED NOT DETECTED Final   Streptococcus pneumoniae NOT DETECTED NOT DETECTED Final   Streptococcus pyogenes NOT DETECTED NOT DETECTED Final   A.calcoaceticus-baumannii NOT DETECTED NOT DETECTED Final   Bacteroides fragilis NOT DETECTED NOT DETECTED Final   Enterobacterales NOT DETECTED NOT DETECTED Final   Enterobacter cloacae complex NOT DETECTED NOT DETECTED Final   Escherichia coli NOT DETECTED NOT DETECTED Final   Klebsiella aerogenes NOT DETECTED NOT DETECTED Final   Klebsiella oxytoca NOT DETECTED NOT DETECTED Final   Klebsiella pneumoniae NOT DETECTED NOT DETECTED Final   Proteus species NOT DETECTED NOT DETECTED Final   Salmonella species NOT DETECTED NOT DETECTED Final   Serratia marcescens NOT DETECTED NOT DETECTED Final   Haemophilus influenzae NOT DETECTED NOT DETECTED Final   Neisseria meningitidis NOT DETECTED NOT DETECTED Final   Pseudomonas aeruginosa NOT DETECTED NOT DETECTED Final   Stenotrophomonas maltophilia NOT DETECTED NOT DETECTED Final   Candida albicans NOT DETECTED NOT DETECTED Final   Candida auris NOT DETECTED NOT DETECTED Final   Candida glabrata NOT DETECTED NOT DETECTED Final   Candida krusei NOT DETECTED NOT DETECTED Final   Candida parapsilosis NOT DETECTED NOT DETECTED Final   Candida tropicalis NOT DETECTED NOT DETECTED Final   Cryptococcus neoformans/gattii NOT DETECTED NOT DETECTED Final   Meth resistant  mecA/C and MREJ NOT DETECTED NOT DETECTED Final    Comment: Performed at [facility], [address] [place], [place] [address]  Resp Panel by RT-PCR (Flu A&B, Covid) Nasopharyngeal Swab     Status: None   Collection Time: 03/20/21  4:01 AM   Specimen: Nasopharyngeal Swab; Nasopharyngeal(NP) swabs in vial transport medium  Result Value Ref Range Status   SARS Coronavirus 2 by RT PCR NEGATIVE NEGATIVE Final    Comment: (NOTE) SARS-CoV-2 target nucleic acids are NOT DETECTED.  The SARS-CoV-2 RNA is generally detectable in upper respiratory specimens during the acute phase of infection. The lowest concentration of SARS-CoV-2 viral copies this assay can detect is 138 copies/mL. A negative result does not preclude SARS-Cov-2 infection and should not be used as the sole basis for treatment or other patient management decisions. A negative result may occur with  improper specimen collection/handling, submission of specimen other than nasopharyngeal swab, presence of viral mutation(s) within the areas targeted by this assay, and inadequate number of viral copies(<138 copies/mL). A negative result must be combined with clinical observations, patient history, and epidemiological information. The expected result is Negative.  Fact Sheet for Patients:  EntrepreneurPulse.com.au  Fact Sheet for Healthcare Providers:  IncredibleEmployment.be  This test is no t yet approved or cleared by the [place] FDA and  has been authorized for detection and/or diagnosis of SARS-CoV-2 by FDA under an Emergency Use Authorization (EUA). This EUA will remain  in effect (meaning this test can be used) for the duration of the COVID-19 declaration under Section 564(b)(1) of the Act, 21 U.S.C.section 360bbb-3(b)(1), unless the authorization is terminated  or revoked sooner.  Influenza A by PCR NEGATIVE NEGATIVE Final   Influenza B by PCR NEGATIVE NEGATIVE  Final    Comment: (NOTE) The Xpert Xpress SARS-CoV-2/FLU/RSV plus assay is intended as an aid in the diagnosis of influenza from Nasopharyngeal swab specimens and should not be used as a sole basis for treatment. Nasal washings and aspirates are unacceptable for Xpert Xpress SARS-CoV-2/FLU/RSV testing.  Fact Sheet for Patients: EntrepreneurPulse.com.au  Fact Sheet for Healthcare Providers: IncredibleEmployment.be  This test is not yet approved or cleared by the [place] FDA and has been authorized for detection and/or diagnosis of SARS-CoV-2 by FDA under an Emergency Use Authorization (EUA). This EUA will remain in effect (meaning this test can be used) for the duration of the COVID-19 declaration under Section 564(b)(1) of the Act, 21 U.S.C. section 360bbb-3(b)(1), unless the authorization is terminated or revoked.  Performed at [facility], [address] [place], [place] [address]   Blood culture (routine x 2)     Status: None (Preliminary result)   Collection Time: 03/20/21 11:43 AM   Specimen: BLOOD  Result Value Ref Range Status   Specimen Description BLOOD WRIST  Final   Special Requests   Final    BOTTLES DRAWN AEROBIC AND ANAEROBIC Blood Culture results may not be optimal due to an excessive volume of blood received in culture bottles   Culture   Final    NO GROWTH 4 DAYS Performed at [facility], [address] [place], [place] [address]    Report Status PENDING  Incomplete  Aerobic/Anaerobic Culture w Gram Stain (surgical/deep wound)     Status: None (Preliminary result)   Collection Time: 03/20/21  8:41 PM   Specimen: PATH Other; Tissue  Result Value Ref Range Status   Specimen Description   Final    TISSUE Performed at [facility], [address] [place], [place] [address]    Special Requests   Final    RIGHT GREAT TOE Performed at [facility], [address]  [place], [place] [address]    Gram Stain   Final    ABUNDANT WBC PRESENT,BOTH PMN AND MONONUCLEAR FEW GRAM POSITIVE COCCI Performed at [facility], [place] [address] [place], [place] [address]    Culture   Final    ABUNDANT STAPHYLOCOCCUS AUREUS NO ANAEROBES ISOLATED; CULTURE IN PROGRESS FOR 5 DAYS    Report Status PENDING  Incomplete   Organism ID, Bacteria STAPHYLOCOCCUS AUREUS  Final      Susceptibility   Staphylococcus aureus - MIC*    CIPROFLOXACIN <=0.5 SENSITIVE Sensitive     ERYTHROMYCIN >=8 RESISTANT Resistant     GENTAMICIN <=0.5 SENSITIVE Sensitive     OXACILLIN 0.5 SENSITIVE Sensitive     TETRACYCLINE <=1 SENSITIVE Sensitive     VANCOMYCIN <=0.5 SENSITIVE Sensitive     TRIMETH/SULFA <=10 SENSITIVE Sensitive     CLINDAMYCIN RESISTANT Resistant     RIFAMPIN <=0.5 SENSITIVE Sensitive     Inducible Clindamycin POSITIVE Resistant     * ABUNDANT STAPHYLOCOCCUS AUREUS  Culture, blood (single) w Reflex to ID Panel     Status: None (Preliminary result)   Collection Time: 03/22/21  4:51 AM   Specimen: BLOOD  Result Value Ref Range Status   Specimen Description BLOOD  RIGHT ARM  Final   Special Requests   Final    BOTTLES DRAWN AEROBIC AND ANAEROBIC Blood Culture results may not be optimal due to an excessive volume of blood received in culture bottles  Culture   Final    NO GROWTH 2 DAYS Performed at [facility], [address] [place], [place] [address]    Report Status PENDING  Incomplete    IMAGING: DG Lumbar Spine Complete W/Bend  Result Date: 03/04/2021 CLINICAL DATA:  Low back pain with radicular symptoms EXAM: LUMBAR SPINE - COMPLETE WITH BENDING VIEWS COMPARISON:  October 07, 2020 FINDINGS: Frontal, neutral lateral, flexion lateral, extension lateral, spot lumbosacral lateral, and bilateral oblique views were obtained. There are 5 non-rib-bearing lumbar type vertebral bodies. There is mild lumbar dextroscoliosis. There is no demonstrable fracture.  There is no appreciable spondylolisthesis on neutral lateral imaging. There is no appreciable change in lateral alignment between neutral lateral, flexion lateral, and extension lateral imaging. There is mild disc space narrowing at all levels. There is facet osteoarthritic change at L5-S1 on the left. Other facets appear unremarkable. IMPRESSION: No fracture. No spondylolisthesis on neutral lateral imaging. No change in lateral alignment between neutral lateral, flexion lateral, and extension lateral imaging. Disc space narrowing, relatively mild, at all levels. Facet osteoarthritic change noted at L5-S1 on the left. Electronically Signed   By: Lowella Grip III M.D.   On: 03/04/2021 14:24   US RENAL  Result Date: 03/18/2021 CLINICAL DATA:  Stage 3 chronic kidney disease Type 2 diabetes EXAM: RENAL / URINARY TRACT ULTRASOUND COMPLETE COMPARISON:  None. FINDINGS: Right Kidney: Renal measurements: 11.0 x 5.4 x 5.7 cm = volume: 176 mL. Echogenicity within normal limits. No mass or hydronephrosis visualized. Left Kidney: Renal measurements: 10.3 x 5.0 x 4.8 cm = volume: 130 mL. Echogenicity within normal limits. No mass or hydronephrosis visualized. Limited visualization due to shadowing bowel gas. Bladder: Appears normal for degree of bladder distention. Other: None. IMPRESSION: No significant abnormality of the kidneys. Electronically Signed   By: Miachel Roux M.D.   On: 03/18/2021 09:37   DG Foot Complete Right  Result Date: 03/20/2021 CLINICAL DATA:  80 year old male status post right foot surgery for infection a few days ago. Hypotension, dizziness. EXAM: RIGHT FOOT COMPLETE - 3+ VIEW COMPARISON:  Right foot series 02/13/2021. FINDINGS: Right great toe osteotomy about the IP joint which has been resected. Generalized soft tissue swelling in the right great toe and medial foot. No soft tissue gas. No definite cortical osteolysis along the osteotomy margins. First MTP and other joint spaces appear stable  from last month. Calcified peripheral vascular disease. No new osseous abnormality. IMPRESSION: 1. Interval right great toe osteotomy about the IP joint which was resected. No superimposed plain radiographic evidence of osteomyelitis at this time. 2. Generalized soft tissue swelling. Calcified peripheral vascular disease. Electronically Signed   By: Genevie Ann M.D.   On: 03/20/2021 04:45   VAS [place] ABI WITH/WO TBI  Result Date: 03/21/2021 LOWER EXTREMITY DOPPLER STUDY Indications: Peripheral artery disease.  Vascular Interventions: 02/17/21 Right PTA of SFA, DP, and ATA. Performing Technologist: Concha Norway RVT  Examination Guidelines: A complete evaluation includes at minimum, Doppler waveform signals and systolic blood pressure reading at the level of bilateral brachial, anterior tibial, and posterior tibial arteries, when vessel segments are accessible. Bilateral testing is considered an integral part of a complete examination. Photoelectric Plethysmograph (PPG) waveforms and toe systolic pressure readings are included as required and additional duplex testing as needed. Limited examinations for reoccurring indications may be performed as noted.  ABI Findings: +---------+------------------+-----+---------+--------+ Right    Rt Pressure (mmHg)IndexWaveform Comment  +---------+------------------+-----+---------+--------+ Brachial 159                                      +---------+------------------+-----+---------+--------+  ATA      187               1.18 biphasic          +---------+------------------+-----+---------+--------+ PTA      185               1.16 triphasic         +---------+------------------+-----+---------+--------+ Great Toe146               0.92 Normal            +---------+------------------+-----+---------+--------+ +---------+------------------+-----+--------+-------+ Left     Lt Pressure (mmHg)IndexWaveformComment  +---------+------------------+-----+--------+-------+ ATA      201               1.26 biphasic        +---------+------------------+-----+--------+-------+ PTA      196               1.23 biphasic        +---------+------------------+-----+--------+-------+ Great Toe143               0.90 Abnormal        +---------+------------------+-----+--------+-------+ +-------+-----------+-----------+------------+------------+ ABI/TBIToday's ABIToday's TBIPrevious ABIPrevious TBI +-------+-----------+-----------+------------+------------+ Right  1.18       .92        1.08        .73          +-------+-----------+-----------+------------+------------+ Left   1.26       .90        [place]          .81          +-------+-----------+-----------+------------+------------+  Summary: Right: Resting right ankle-brachial index is within normal range. No evidence of significant right lower extremity arterial disease. The right toe-brachial index is normal. Left: Resting left ankle-brachial index is within normal range. No evidence of significant left lower extremity arterial disease. The left toe-brachial index is normal.  *See table(s) above for measurements and observations.  Electronically signed by Leotis Pain MD on 03/21/2021 at 10:09:19 AM.   Final    [place] EKG SITE RITE  Result Date: 03/24/2021 If Site Rite image not attached, placement could not be confirmed due to current cardiac rhythm.  SURGICAL PATHOLOGY  CASE: [phone]  PATIENT: Durward Fortes  Surgical Pathology Report      Specimen Submitted:  A. Right Great Toe   Clinical History: Non-healing ulcer right great toe       DIAGNOSIS:  A. TOE, RIGHT GREAT; IRRITATION AND DEBRIDEMENT:  - TWO BONE FRAGMENTS, NEGATIVE FOR ACUTE OSTEOMYELITIS AT INKED SURFACE.  - BONE AND SOFT TISSUE WITH NON-SPECIFIC REACTIVE CHANGES. Assessment:   Nathan Singleton is a 80 y.o. male with recent hospitalization for DM Foot ulcer and  osteomyelitis s/p I and D with neg bone cultures and neg path for osteo, treated initially with inpt IV abx then oral keflex. Now readmitted with dizziness, hypotension and found to have MSSA bacteremia. Now s/p repeat surgery with toe amputation.  He is relatively stable and no signs of metatatic infection however he has a bioprosthetic valve, PPM, bil TKR and R shoulder replacment   April 11th -he has been afebrile over the last few days and his white count 3.7.  He remains just on cefazolin.  Follow-up blood cultures done April 7 and 9 remain negative.  CRP was noted to be 11 sed rate 70 on April 7.  Culture from his surgery on the seventh grew methicillin sensitive staph aureus as well.  He has been seen  by surgery and there is some concern he may need further surgical debridement given peripheral vascular disease.  He has had his echocardiogram but is not read yet.  Recommendations FU BCX neg Neg TTE -  Please consult cardiology for  TEE (has bioprosthetic valve and Pacemaker so high risk of endocarditis). Will need aggressive treatment given the prosthetic joints and the PPM wires and prosthetic valve Cont  cefazolin. Thank you very much for allowing me to participate in the care of this patient. Please call with questions.   Cheral Marker. Ola Spurr, MD

## 2021-03-24 NOTE — Progress Notes (Signed)
Patient moving well today. BP was elevated this am. Gave hydralazine and am BP med's. Dr. Arbutus Ped at bedside for recheck of BP. Lasix and amlodipine administered at this time. diastolic still elevated. Awaiting results of ultrasound of heart and PICC line. Will continue to monitor

## 2021-03-25 DIAGNOSIS — I959 Hypotension, unspecified: Secondary | ICD-10-CM

## 2021-03-25 DIAGNOSIS — I739 Peripheral vascular disease, unspecified: Secondary | ICD-10-CM | POA: Diagnosis not present

## 2021-03-25 DIAGNOSIS — L03031 Cellulitis of right toe: Secondary | ICD-10-CM

## 2021-03-25 LAB — CULTURE, BLOOD (ROUTINE X 2): Culture: NO GROWTH

## 2021-03-25 LAB — GLUCOSE, CAPILLARY
Glucose-Capillary: 237 mg/dL — ABNORMAL HIGH (ref 70–99)
Glucose-Capillary: 67 mg/dL — ABNORMAL LOW (ref 70–99)
Glucose-Capillary: 143 mg/dL — ABNORMAL HIGH (ref 70–99)
Glucose-Capillary: 133 mg/dL — ABNORMAL HIGH (ref 70–99)
Glucose-Capillary: 78 mg/dL (ref 70–99)
Glucose-Capillary: 316 mg/dL — ABNORMAL HIGH (ref 70–99)

## 2021-03-25 MED ORDER — INSULIN GLARGINE 100 UNIT/ML ~~LOC~~ SOLN
18.0000 [IU] | Freq: Every day | SUBCUTANEOUS | Status: DC
  Administered 2021-03-25 – 2021-03-27 (×3): 18 [IU] via SUBCUTANEOUS
  Filled 2021-03-25 (×5): qty 0.18

## 2021-03-25 NOTE — Consult Note (Signed)
Cardiology Consultation:   Patient ID: Nathan Singleton; [id number]; 21-Feb-1941   Admit date: 03/20/2021 Date of Consult: 03/25/2021  Primary Care Provider: Ranae Plumber, [place] Primary Cardiologist: Agustin Cree - formerly [facility]  Primary Electrophysiologist:  [facility]   Patient Profile:   Nathan Singleton is a 80 y.o. 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 Arbutus Ped.  History of Present Illness:   Nathan Singleton was previously followed by [facility] in  [place], though has more recently established with Dr. Joycelyn Rua upon relocating to [facility]. Most recent cardiac cath on 12/12/2019 showed stable, mild nonobstructive disease and patent previously placed proximal RCA stent with mild in-stent restenosis.  Nuclear stress test on 06/15/2020 showed no perfusion defect with an EF of 63%.  He has been seen in the ER several times due to mechanical falls.  He was admitted in 02/2021 with a diabetic foot ulcer with bone scan concerning for osteomyelitis of the great toe.  He underwent I&D and biopsy which showed no evidence of osteomyelitis.  He was treated with antibiotics.  He was evaluated by vascular surgery and underwent angiogram and angioplasty of the SFA and percutaneous transluminal angioplasty of the right dorsalis podalic artery and distal anterior tibial artery. He was started on aspirin and Plavix.  Note indicates his pacer was interrogated with no events noted.  He was  admitted on 4/7 with cellulitis of the right great toe ultimately requiring amputation due to gangrenous changes on 4/7 with culture from his toe growing MSSA.  Blood cultures positive for MSSA with follow-up blood cultures negative.  2D surface echo on 4/11 showed an EF greater than 55%, indeterminate LV diastolic function parameters, normal RV systolic function and ventricular cavity size, mildly dilated left atrium, mild mitral regurgitation, mild aortic valve stenosis with a mean gradient of 16 mmHg, and an estimated right atrial pressure of 15 mmHg.  There was no definite evidence of endocarditis.  However, the acoustic windows were suboptimal.  In this setting, TEE has been recommended.  He has been evaluated by vascular surgery with plans to undergo repeat lower extremity angiogram on 4/14.  No chest pain, dyspnea, palpitations, dizziness, presyncope, or syncope.  No dysphagia.   Past Medical History:  Diagnosis Date  . Aneurysm (arteriovenous) of coronary vessels   . Arthritis   . Ascending aortic aneurysm ([place])   . CAD (coronary artery disease)   . Diabetes mellitus without complication ([place])   . History of GI bleed   . Hyperlipidemia associated with type 2 diabetes mellitus ([place])   . Hypertension associated with diabetes ([place])   . Presence of permanent cardiac pacemaker   . PVD (peripheral vascular disease) ([place])   . S/P TAVR (transcatheter aortic valve replacement)     Past Surgical History:  Procedure Laterality Date  . AMPUTATION TOE Right 03/20/2021   Procedure: AMPUTATION TOE;  Surgeon: Sharlotte Alamo, DPM;  Location: [facility] ORS;  Service: Podiatry;  Laterality: Right;  . Ankle repaired Right   .  Carpal Tunnel repaired Left   . CATARACT EXTRACTION Bilateral   . COLONOSCOPY    . Copillar implant     . DG CHOLECYSTOGRAPHY GALL BLADDER ([place] HX)    . GASTRIC BYPASS    . heart valve replaced    . IRRIGATION AND DEBRIDEMENT FOOT Right 02/18/2021   Procedure: IRRIGATION AND DEBRIDEMENT  FOOT-Right Great Toe;  Surgeon: [name], DPM;  Location: [facility] ORS;  Service: Podiatry;  Laterality: Right;  . LOWER EXTREMITY ANGIOGRAPHY Right 02/17/2021   Procedure: Lower Extremity Angiography;  Surgeon: Algernon Huxley, MD;  Location: [place] [facility];  Service: Cardiovascular;  Laterality: Right;  . PACEMAKER GENERATOR CHANGE    . REPLACEMENT TOTAL KNEE BILATERAL    . RTC    . STENT PLACE LEFT URETER ([facility] HX) Right    Right Leg  . Toe nail removed Bilateral      Home Meds: Prior to Admission medications   Medication Sig Start Date End Date Taking? Authorizing Provider  carvedilol (COREG) 6.25 MG tablet Take 6.25 mg by mouth at bedtime. 03/11/21  Yes [provider]  allopurinol (ZYLOPRIM) 100 MG tablet Take 100 mg by mouth daily. 10/19/20   [provider]  amLODipine (NORVASC) 10 MG tablet Take 10 mg by mouth daily.    [provider]  baclofen (LIORESAL) 10 MG tablet Take 10 mg by mouth 2 (two) times daily. 02/12/21   [provider]  BAYER ASPIRIN EC LOW DOSE 81 MG EC tablet Take 81 mg by mouth daily. 11/11/20   [provider]  calcium citrate-vitamin D (CITRACAL+D) 315-200 MG-UNIT tablet Take 1 tablet by mouth daily. 11/11/20   [provider]  cetirizine (ZYRTEC) 10 MG tablet Take 10 mg by mouth daily. 11/11/20   [provider]  clopidogrel (PLAVIX) 75 MG tablet Take 1 tablet (75 mg total) by mouth daily. 02/22/21   Charlynne Cousins, MD  D-5000 125 MCG (5000 UT) TABS Take 1 tablet by mouth daily. 11/11/20   [provider]  diphenoxylate-atropine (LOMOTIL) 2.5-0.025 MG tablet Take 1 tablet by mouth every 6 (six) hours as needed for diarrhea or loose stools. 01/01/21   [provider]  docusate sodium (COLACE) 100 MG capsule Take 200 mg by mouth 2 (two) times daily. 11/11/20   [provider]  furosemide (LASIX) 20 MG tablet Take 20 mg by mouth daily. 10/19/20   [provider]   gabapentin (NEURONTIN) 100 MG capsule Take 100 mg by mouth daily. 01/29/21   [provider]  HYDROcodone-acetaminophen (NORCO) 7.5-325 MG tablet Take 1-2 tablets by mouth every 6 (six) hours as needed for moderate pain or severe pain. 02/22/21   Charlynne Cousins, MD  insulin glargine (LANTUS) 100 UNIT/ML Solostar Pen Inject 25 Units into the skin at bedtime.    [provider]  isosorbide mononitrate (IMDUR) 30 MG 24 hr tablet Take 30 mg by mouth daily. 02/12/21   [provider]  LINZESS 145 MCG CAPS capsule Take 145 mcg by mouth daily. 11/11/20   [provider]  liraglutide (VICTOZA) 18 MG/3ML SOPN Inject 1.8 mg into the skin daily.    [provider]  losartan (COZAAR) 25 MG tablet Take 1 tablet (25 mg total) by mouth daily. 02/24/21   Charlynne Cousins, MD  multivitamin (ONE-A-DAY MEN'S) TABS tablet Take 1 tablet by mouth daily. 11/11/20   [provider]  pantoprazole (PROTONIX) 40 MG tablet Take 1 tablet (40 mg total) by mouth  daily. 02/11/21   [name], MD  rosuvastatin (CRESTOR) 40 MG tablet Take 40 mg by mouth daily. 02/13/21   [provider]  sertraline (ZOLOFT) 100 MG tablet Take 200 mg by mouth daily. 11/11/20   [provider]  tamsulosin (FLOMAX) 0.4 MG CAPS capsule Take 0.4 mg by mouth daily. 11/11/20   [provider]    Inpatient Medications: Scheduled Meds: . allopurinol  100 mg Oral Daily  . amLODipine  10 mg Oral Daily  . aspirin EC  81 mg Oral Daily  . baclofen  10 mg Oral BID  . carvedilol  6.25 mg Oral QHS  . Chlorhexidine Gluconate Cloth  6 each Topical Daily  . cholecalciferol  5,000 Units Oral Daily  . clopidogrel  75 mg Oral Daily  . docusate sodium  200 mg Oral BID  . furosemide  20 mg Oral Daily  . gabapentin  100 mg Oral Daily  . insulin aspart  0-5 Units Subcutaneous QHS  . insulin aspart  0-9 Units Subcutaneous TID WC  . insulin aspart  10 Units Subcutaneous TID WC  .  insulin glargine  25 Units Subcutaneous QHS  . isosorbide mononitrate  30 mg Oral Daily  . linaclotide  145 mcg Oral Daily  . loratadine  10 mg Oral Daily  . multivitamin with minerals  1 tablet Oral Daily  . pantoprazole  40 mg Oral Daily  . rosuvastatin  40 mg Oral Daily  . sertraline  200 mg Oral Daily  . sodium chloride flush  10-40 mL Intracatheter Q12H  . tamsulosin  0.4 mg Oral Daily   Continuous Infusions: . sodium chloride 10 mL/hr at 03/25/21 0623  .  ceFAZolin (ANCEF) IV 2 g (03/25/21 1259)   PRN Meds: sodium chloride, acetaminophen, albuterol, dextromethorphan-guaiFENesin, diphenoxylate-atropine, hydrALAZINE, HYDROcodone-acetaminophen, ondansetron (ZOFRAN) IV, senna-docusate, sodium chloride flush  Allergies:   Allergies  Allergen Reactions  . Atorvastatin Other (See Comments)    "bad for kidneys" Cramping   . Celecoxib Other (See Comments)    Other reaction(s): Other (See Comments) Kidney Problem Kidney Problem   . Dicyclomine Hcl Other (See Comments)    Stomach cramps  . Doxycycline Other (See Comments)    Per wife (per RN)- pt gets severe stomach pains even when takes with food and PCP said to not take    . Metformin Other (See Comments)    Social History:   Social History   Socioeconomic History  . Marital status: Single    Spouse name: Not on file  . Number of children: Not on file  . Years of education: Not on file  . Highest education level: Not on file  Occupational History  . Not on file  Tobacco Use  . Smoking status: Former Research scientist (life sciences)  . Smokeless tobacco: Never Used  Substance and Sexual Activity  . Alcohol use: Not Currently  . Drug use: Not Currently  . Sexual activity: Not on file  Other Topics Concern  . Not on file  Social History Narrative  . Not on file   Social Determinants of Health   Financial Resource Strain: Not on file  Food Insecurity: Not on file  Transportation Needs: Not on file  Physical Activity: Not on file   Stress: Not on file  Social Connections: Not on file  Intimate Partner Violence: Not on file     Family History:   Family History  Problem Relation Age of Onset  . Heart disease Mother   . Alzheimer's disease Mother   .  Heart disease Father   . Diabetes Father     ROS:  Review of Systems  Constitutional: Positive for malaise/fatigue. Negative for chills, diaphoresis, fever and weight loss.  HENT: Negative for congestion.   Eyes: Negative for discharge and redness.  Respiratory: Negative for cough, sputum production, shortness of breath and wheezing.   Cardiovascular: Negative for chest pain, palpitations, orthopnea, claudication, leg swelling and PND.  Gastrointestinal: Negative for abdominal pain, heartburn, nausea and vomiting.  Musculoskeletal: Negative for falls and myalgias.  Skin: Negative for rash.  Neurological: Positive for weakness. Negative for dizziness, tingling, tremors, sensory change, speech change, focal weakness and loss of consciousness.  Endo/Heme/Allergies: Does not bruise/bleed easily.  Psychiatric/Behavioral: Negative for substance abuse. The patient is not nervous/anxious.   All other systems reviewed and are negative.     Physical Exam/Data:   Vitals:   03/24/21 1917 03/25/21 0001 03/25/21 0502 03/25/21 0842  BP: 135/89 (!) 161/61 133/64 (!) 140/44  Pulse: (!) 59 61 66 75  Resp: '18 16 18 16  '$ Temp: 98.1 F (36.7 C) 98.9 F (37.2 C) 99.1 F (37.3 C) 99.7 F (37.6 C)  TempSrc:      SpO2: 100% 100% 96% 99%  Weight:      Height:        Intake/Output Summary (Last 24 hours) at 03/25/2021 1556 Last data filed at 03/25/2021 1401 Gross per 24 hour  Intake 360 ml  Output --  Net 360 ml   Filed Weights   03/20/21 0332  Weight: 105.2 kg   Body mass index is 31.46 kg/m.   Physical Exam: General: Well developed, well nourished, in no acute distress. Head: Normocephalic, atraumatic, sclera non-icteric, no xanthomas, nares without  discharge.  Neck: Negative for carotid bruits. JVD not elevated. Lungs: Clear bilaterally to auscultation without wheezes, rales, or rhonchi. Breathing is unlabored. Heart: RRR with S1 S2. II/VI systolic murmur RUSB, no rubs, or gallops appreciated. Abdomen: Soft, non-tender, non-distended with normoactive bowel sounds. No hepatomegaly. No rebound/guarding. No obvious abdominal masses. Msk:  Strength and tone appear normal for age. Extremities: No clubbing or cyanosis.  Right lower extremity dressed with postop boot noted. Neuro: Alert and oriented X 3. No facial asymmetry. No focal deficit. Moves all extremities spontaneously. Psych:  Responds to questions appropriately with a normal affect.   EKG:  The EKG was personally reviewed and demonstrates: NSR, 61 bpm, RBBB with underlying artifact making further interpretation difficult Telemetry:  Telemetry was personally reviewed and demonstrates: Not on telemetry  Weights: Filed Weights   03/20/21 0332  Weight: 105.2 kg    Relevant CV Studies:  2D echo 03/24/2021: 1. Left ventricular ejection fraction, by estimation, is >55%. The left  ventricle has normal function. Left ventricular endocardial border not  optimally defined to evaluate regional wall motion. Left ventricular  diastolic parameters are indeterminate.  2. Right ventricular systolic function is normal. The right ventricular  size is normal.  3. Left atrial size was mildly dilated.  4. The mitral valve was not well visualized. Mild mitral valve  regurgitation. No evidence of mitral stenosis.  5. The aortic valve was not well visualized. Aortic valve regurgitation  is not visualized. Mild aortic valve stenosis. Aortic valve mean gradient  measures 16.0 mmHg.  6. The inferior vena cava is dilated in size with <50% respiratory  variability, suggesting right atrial pressure of 15 mmHg.   Conclusion(s)/Recommendation(s): No definite evidence of endocarditis.  However,  acoustic windows are suboptimal and transesophageal  echocardiogram should be  considered in the setting of implanted cardiac  devices and MSSA bacteremia.  __________  Nuclear stress test 06/2020: Findings: Tid measures 1.0. Ejection fraction measures 63%. No perfusion defect is identified. __________  LHC 11/2019: 1. Mild nonobstructive coronary disease.  2. Previously placed stent to the proximal RCA has mild in-stent  stenosis.  3. No complications, estimated blood loss minimal.   Laboratory Data:  Chemistry Recent Labs  Lab 03/22/21 0451 03/23/21 0446 03/24/21 0540  NA 134* 136 138  K 4.2 4.6 4.6  CL 104 106 107  CO2 '23 23 24  '$ GLUCOSE 262* 185* 107*  BUN 41* 42* 53*  CREATININE 1.49* 1.47* 1.54*  CALCIUM 8.2* 8.4* 8.6*  GFRNONAA 48* 49* 46*  ANIONGAP '7 7 7    '$ Recent Labs  Lab 03/20/21 0346  PROT 7.0  ALBUMIN 3.8  AST 37  ALT 38  ALKPHOS 95  BILITOT 0.6   Hematology Recent Labs  Lab 03/22/21 0451 03/23/21 0446 03/24/21 0540  WBC 5.1 4.0 3.7*  RBC 2.65* 2.63* 2.72*  HGB 8.2* 8.0* 8.4*  HCT 24.6* 24.4* 24.9*  MCV 92.8 92.8 91.5  MCH 30.9 30.4 30.9  MCHC 33.3 32.8 33.7  RDW 13.9 13.7 13.7  PLT 84* 96* 118*   Cardiac EnzymesNo results for input(s): TROPONINI in the last 168 hours. No results for input(s): TROPIPOC in the last 168 hours.  BNP Recent Labs  Lab 03/20/21 0346  BNP 155.6*    DDimer No results for input(s): DDIMER in the last 168 hours.  Radiology/Studies:   Complete right foot 03/20/2021: IMPRESSION: 1. Interval right great toe osteotomy about the IP joint which was resected. No superimposed plain radiographic evidence of osteomyelitis at this time. 2. Generalized soft tissue swelling. Calcified peripheral vascular disease.  Assessment and Plan:   1.  Right toe cellulitis with gangrenous changes with concomitant MSSA bacteremia: -Repeat blood cultures negative to date -Tentatively plan for TEE within the next 24 to 48  hours -May need GI input given the patient's history of gastric bypass surgery -We will make n.p.o. in case TEE can be performed on 4/13  2.  CAD involving the native coronary arteries without angina: -No symptoms concerning for angina -No plans for inpatient ischemic evaluation at this time -Continue current medical therapy which includes aspirin, clopidogrel, amlodipine, carvedilol, Imdur, and rosuvastatin  3.  Sick sinus syndrome status post dual-chamber PPM: -Device appears to be functioning normally -May need to get EP involved given MSSA bacteremia pending TEE results -Recommend he establish with EP in the outpatient setting  4.  Aortic valve stenosis status post TAVR: -Status post TAVR in 04/2018 -Planning for TEE as above with further recommendations pending -ASA -SBE prophylaxis in the outpatient setting   5.  PAD: -Remains on aspirin and Plavix per vascular surgery -Vascular surgery planning for repeat lower extremity angiogram on 4/14  6.  History of gastrojejunal ulcers with underlying anemia: -Hgb low though relatively stable  7.  Thrombocytopenia: -PLT count slightly improved  8.  CKD stage III: -Renal function stable   For questions or updates, please contact [place] Please consult [email or web address] for contact info under Cardiology/STEMI.   Signed, Christell Faith, PA-C [place] Pager: [phone] 03/25/2021, 3:56 PM

## 2021-03-25 NOTE — Progress Notes (Signed)
TEE scheduled for noon on 03/26/2021.

## 2021-03-25 NOTE — Progress Notes (Signed)
Notified md of blood glucose again

## 2021-03-25 NOTE — Progress Notes (Signed)
[facility] Daily Progress Note   Subjective: 02/17/21: 1. Ultrasound guidance for vascular access left femoral artery 2. Catheter placement into right SFA from left femoral approach 3. Aortogram and selective right lower extremity angiogram 4. Percutaneous transluminal angioplasty of right SFA with 6 mm diameter by 10 cm length Lutonix drug-coated angioplasty balloon 5.  Percutaneous transluminal angioplasty of the right dorsalis pedis artery and distal anterior tibial artery with 2.5 mm diameter by 8 cm length angioplasty balloon             6.  StarClose closure device left femoral artery  Last seen in clinic on 03/19/21: "The patient is status post successful angiogram with intervention. The patient reports that the claudication symptoms and leg pain is essentially gone. The patient denies lifestyle limiting changes at this point in time."  ABI's during that visit: Right 1.18     Left: 1.26   Right: Resting right ankle-brachial index is within normal range. No  evidence of significant right lower extremity arterial disease. The right  toe-brachial index is normal.  Left: Resting left ankle-brachial index is within normal range. No  evidence of significant left lower extremity arterial disease. The left  toe-brachial index is normal.  Vascular surgery was consulted by Dr. Cleda Mccreedy in the context of known atherosclerotic disease with gangrenous changes / failure to heal recent first toe amputation.  Objective: Vitals:   03/24/21 1917 03/25/21 0001 03/25/21 0502 03/25/21 0842  BP: 135/89 (!) 161/61 133/64 (!) 140/44  Pulse: (!) 59 61 66 75  Resp: '18 16 18 16  '$ Temp: 98.1 F (36.7 C) 98.9 F (37.2 C) 99.1 F (37.3 C) 99.7 F (37.6 C)  TempSrc:      SpO2: 100% 100% 96% 99%  Weight:      Height:        Intake/Output Summary (Last 24 hours) at 03/25/2021 1229 Last data filed at 03/25/2021  1021 Gross per 24 hour  Intake 360 ml  Output --  Net 360 ml   Physical Exam: A&Ox3, NAD CV: RRR Pulmonary: CTA Bilaterally Abdomen: Soft, Nontender, Nondistended Vascular:  Right lower extremity: Thigh soft.  Calf soft.  Extremities warm distally.    Laboratory: CBC    Component Value Date/Time   WBC 3.7 (L) 03/24/2021 0540   HGB 8.4 (L) 03/24/2021 0540   HGB 11.7 (L) 11/19/2020 1439   HCT 24.9 (L) 03/24/2021 0540   HCT 34.3 (L) 11/19/2020 1439   PLT 118 (L) 03/24/2021 0540   PLT 178 03/03/2021 1628   BMET    Component Value Date/Time   NA 138 03/24/2021 0540   NA 135 03/03/2021 1628   K 4.6 03/24/2021 0540   CL 107 03/24/2021 0540   CO2 24 03/24/2021 0540   GLUCOSE 107 (H) 03/24/2021 0540   BUN 53 (H) 03/24/2021 0540   BUN 50 (H) 03/03/2021 1628   CREATININE 1.54 (H) 03/24/2021 0540   CALCIUM 8.6 (L) 03/24/2021 0540   GFRNONAA 46 (L) 03/24/2021 0540   GFRAA 76 11/19/2020 1439   Assessment/Planning: The patient is a 80 year old male with 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  Procedure, risks and benefits were explained to the patient.  All questions were answered.  Patient wishes to proceed.  We will plan on this Thursday with Dr. Lucky Cowboy.  2) hyperlipidemia: On aspirin, Plavix and statin for medical management Encouraged good control as its slows the progression of atherosclerotic disease  3) diabetes: On appropriate medications Encouraged good control as its slows the progression of atherosclerotic diseas  4) chronic kidney  disease: Patient has a history of chronic kidney disease we will try to use minimal amount of contrast  Discussed with Dr. Ellis Parents [facility] PA-C 03/25/2021 12:29 PM

## 2021-03-25 NOTE — Progress Notes (Signed)
PROGRESS NOTE    Quincey Eilertson   [id number]  DOB: [birth year]  PCP: Ranae Plumber, PA    DOA: 03/20/2021 LOS: 5   Brief Narrative   Brittan Penland is a 80 y.o. male with medical history significant of PVD, HTN, HLD, DM, gout, s/p of TAVR with bioprosthesis, pacemaker placement, CAD, CKD-3a, BPH, thrombocytopenia, anemia, chronic pain, who presented to the ED on 03/20/21 with dizziness and worsening right foot pain in addition to swelling and progressive erythema.  Due to dizziness, pt checked BP which was 92/55.  EMS was called.   Pt hospitalized 3/3 to 3/11 due to infected diabetic foot ulcer and PVD. Underwent I&D with biopsy on 02/18/2021.  Biopsy did not show osteomyelitis. Treated with IV Vancomycin, Rocephin and Flagyl and discharged on Keflex for 10 days. Vascular surgery was consulted for PVD. Pt underwent lower extremity angiogram and angioplasty of the SFA and percutaneous transluminal angioplasty of the right dorsalis podalic artery and distal anterior tibial artery. Started on aspirin and Plavix.  Xray of Right Foot in the ED showed:  1. Interval right great toe osteotomy about the IP joint which was resected. No superimposed plain radiographic evidence of osteomyelitis at this time. 2. Generalized soft tissue swelling. Calcified peripheral vascular disease.   Admitted to hospitalist service.  Dr. Cleda Mccreedy of podiatry consulted.  Patient underwent amputation of the Right great toe on evening of 03/20/21.  On IV antibiotics with surgical culture / path pending. Infectious disease is following.    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 S/p amputation on 03/20/21 with Dr. Cleda Mccreedy. --Consults: Podiatry, Vascular, Infectious Disease     - see their recs --Antibiotics: Vanc/Flagyl,/Rocephin >> Ancef --Follow blood and operative cultures/path --Off IV fluids --Pain control as needed --Bowel regimen --PT evaluation - HH PT and OT recommended  Dizziness -Improved with IV hydration.  Most likely due to soft BP, dehydration.  No focal neurologic findings on exam are reported by patient. --Get orthostatic vitals if he's symptomatic --PT and OT eval's - HH recommended  MSSA Bacteremia- one of four bottles with +MSSA, ?contaminant vs hematogenous spread of foot infection. Due to bioprosthetic valve and pacemaker in place, higher risk for endocarditis. --ID following --Repeat blood culture ordered, follow - ngtd --TTE was negative for vegetation --Cardiology consulted for TEE, plan for Wed or Thur --PICC line placed on 4/11  Essential hypertension -antihypertensives held on admission due to soft BP.  Monitor BP and resume meds when indicated.  Insulin-dependent type 2 diabetes -Home regimen is Lantus 25 units and Victoza.  Initially placed on 15 units Lantus and sliding scale.   CBG's uncontrolled. --Increase Lantus to home dose 25 units qHS --Increase NovoLog to 10  units 3 TID WC --Continue sliding scale NovoLog --Hold Victoza  Hyperglycemia - CBG's in 200's today, insulin adjusted as above.  CKD stage IIIa -renal function stable.  Monitor BMP.  Peripheral vascular disease -with recent right lower extremity angioplasty as above. 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  Higher risk for endocarditis with bacteremia.  Echo  pending.  S/p TAVR with Bioprosthetic Aortic Valve - Higher risk for endocarditis with bacteremia.   Echo as above.  Thrombocytopenia -chronic, stable.  No signs of bleeding. --Follow CBC --Monitor for signs of bleeding  Normocytic anemia -with hemoglobin stable 9's --Monitor CBC  COPD -stable without exacerbation symptoms at this time --Albuterol as needed  Depression -continue Zoloft  GERD without esophagitis -continue PPI  Gout -continue allopurinol  Hyperlipidemia -continue Crestor  Obesity: Body mass index is 31.46 kg/m.  Complicates overall care and prognosis.  Recommend lifestyle modifications including physical activity and diet for weight loss and overall long-term health.   DVT prophylaxis: SCDs Start: 03/20/21 0730   Diet:  Diet Orders (From admission, onward)    Start     Ordered   03/26/21 0001  Diet NPO time specified Except for: Sips with Meds  Diet effective midnight       Question:  Except for  Answer:  Sips with Meds   03/25/21 1554   03/20/21 2123  Diet Carb Modified Fluid consistency: Thin; Room service appropriate? Yes  Diet effective now       Question Answer Comment  Calorie Level Medium 1600-2000   Fluid consistency: Thin   Room service appropriate? Yes      03/20/21 2122            Code Status: Full Code    Subjective 03/25/21    Patient awake laying in bed.  Pain controlled fairly well.  Hearing aid battery died, much more difficulty hearing today.  Denies pain, N/V, CP, SOB, F/C or other complaints.    Disposition Plan & Communication   Status is: Inpatient  Inpatient status appropriate due to severity of illness requiring IV therapies and ongoing evaluation awaiting surgical cultures and pathology  Dispo: The patient is from: Home              Anticipated d/c is to: Home              Patient currently not medically stable for discharge   Difficult to place patient no   Consults, Procedures, Significant Events    Consultants:   Podiatry  Infectious disease  Procedures:   Right great toe amputation on 03/20/2021  Antimicrobials:  Anti-infectives (From admission, onward)   Start     Dose/Rate Route Frequency Ordered Stop   03/21/21 2200  ceFAZolin (ANCEF) IVPB 2g/100 mL premix        2 g 200 mL/hr over 30 Minutes Intravenous Every 8 hours 03/21/21 1130     03/21/21 0800  vancomycin (VANCOREADY) IVPB 1000 mg/200 mL  Status:  Discontinued        1,000 mg 200 mL/hr over 60 Minutes Intravenous Every 24 hours 03/20/21 1515 03/21/21 1130   03/20/21 0800  cefTRIAXone (ROCEPHIN) 2 g in sodium chloride 0.9 % 100 mL IVPB  Status:  Discontinued        2 g 200 mL/hr over 30 Minutes Intravenous Every 24 hours 03/20/21 0727 03/21/21 1130   03/20/21 0800  metroNIDAZOLE (FLAGYL) IVPB 500 mg  Status:  Discontinued        500 mg 100 mL/hr over 60 Minutes Intravenous Every 8 hours 03/20/21 0727 03/21/21 1130   03/20/21 0800  vancomycin (VANCOCIN) IVPB 1000 mg/200 mL premix  Status:  Discontinued        1,000 mg 200 mL/hr over 60 Minutes Intravenous Every 24 hours 03/20/21 0732 03/20/21 1515   03/20/21 0400  piperacillin-tazobactam (ZOSYN) IVPB 3.375 g        3.375 g 100 mL/hr over 30 Minutes Intravenous  Once 03/20/21 0353 03/20/21 0444   03/20/21 0400  vancomycin (VANCOCIN) IVPB 1000 mg/200 mL premix        1,000 mg 200 mL/hr over 60 Minutes Intravenous  Once 03/20/21 0353 03/20/21 0552        Micro    Objective   Vitals:   03/24/21 1917 03/25/21 0001 03/25/21 0502 03/25/21 0842  BP: 135/89 (!) 161/61 133/64 (!) 140/44  Pulse: (!) 59 61 66 75  Resp: '18 16 18 16  '$ Temp: 98.1 F (36.7 C) 98.9 F (37.2 C) 99.1 F (37.3 C) 99.7 F (37.6 C)  TempSrc:      SpO2: 100% 100% 96% 99%  Weight:      Height:        Intake/Output Summary (Last 24 hours) at 03/25/2021 1736 Last data filed at 03/25/2021 1401 Gross per 24 hour  Intake 360 ml  Output --  Net 360 ml   Filed Weights   03/20/21 0332   Weight: 105.2 kg    Physical Exam:  General exam: awake, alert, no acute distress, laying in bed HEENT: clear conjunctiva, hard of hearing, moist mucus membranes Respiratory system: symmetric chest rise, normal respiratory effort, on room air Cardiovascular system: RRR, no edema Extremities: Right foot Ace bandage intact, no cyanosis, normal tone   Labs   Data Reviewed: I have personally reviewed following labs and imaging studies  CBC: Recent Labs  Lab 03/20/21 0346 03/21/21 0427 03/22/21 0451 03/23/21 0446 03/24/21 0540  WBC 6.9 6.4 5.1 4.0 3.7*  NEUTROABS 5.5  --   --   --   --   HGB 9.6* 9.0* 8.2* 8.0* 8.4*  HCT 28.0* 27.0* 24.6* 24.4* 24.9*  MCV 91.5 91.2 92.8 92.8 91.5  PLT 95* 84* 84* 96* [id number]*   Basic Metabolic Panel: Recent Labs  Lab 03/20/21 0346 03/21/21 0427 03/22/21 0451 03/23/21 0446 03/24/21 0540  NA 136 136 134* 136 138  K 4.4 4.0 4.2 4.6 4.6  CL 110 110 104 106 107  CO2 18* 19* '23 23 24  '$ GLUCOSE 172* 252* 262* 185* 107*  BUN 48* 38* 41* 42* 53*  CREATININE 1.45* 1.45* 1.49* 1.47* 1.54*  CALCIUM 8.7* 8.5* 8.2* 8.4* 8.6*  MG  --   --   --   --  2.2   GFR: Estimated Creatinine Clearance: 49.5 mL/min (A) (by C-G formula based on SCr of 1.54 mg/dL (H)). Liver Function Tests: Recent Labs  Lab 03/20/21 0346  AST 37  ALT 38  ALKPHOS 95  BILITOT 0.6  PROT 7.0  ALBUMIN 3.8   No results for input(s): LIPASE, AMYLASE in the last 168 hours. No results for input(s): AMMONIA in the last 168 hours. Coagulation Profile: Recent Labs  Lab 03/20/21 0346  INR 1.2   Cardiac Enzymes: No results for input(s): CKTOTAL, CKMB, CKMBINDEX, TROPONINI in the last 168 hours. BNP (last 3 results) No results for input(s): PROBNP in the last 8760 hours. HbA1C: No results for input(s): HGBA1C in the last 72 hours. CBG: Recent Labs  Lab 03/24/21 2050 03/25/21 0800 03/25/21 0842 03/25/21 1101 03/25/21 1716  GLUCAP 101* 67* 143* 133* 78   Lipid  Profile: No results for input(s): CHOL, HDL, LDLCALC, TRIG,  CHOLHDL, LDLDIRECT in the last 72 hours. Thyroid Function Tests: No results for input(s): TSH, T4TOTAL, FREET4, T3FREE, THYROIDAB in the last 72 hours. Anemia Panel: No results for input(s): VITAMINB12, FOLATE, FERRITIN, TIBC, IRON, RETICCTPCT in the last 72 hours. Sepsis Labs: Recent Labs  Lab 03/20/21 0346 03/20/21 0352 03/21/21 0427 03/22/21 0451  PROCALCITON 0.61  --  0.58 0.49  LATICACIDVEN  --  0.9  --   --     Recent Results (from the past 240 hour(s))  Blood culture (routine x 2)     Status: Abnormal   Collection Time: 03/20/21  3:52 AM   Specimen: BLOOD LEFT FOREARM  Result Value Ref Range Status   Specimen Description   Final    BLOOD LEFT FOREARM Performed at [facility], [place] [address], [place], [place] [address]    Special Requests   Final    BOTTLES DRAWN AEROBIC AND ANAEROBIC Blood Culture adequate volume Performed at [facility], [address] [place], [place] [address]    Culture  Setup Time   Final    AEROBIC BOTTLE ONLY GRAM POSITIVE COCCI Organism ID to follow CRITICAL RESULT CALLED TO, READ BACK BY AND VERIFIED WITH: ALEX CHAPPELL PHARMD 2225 03/20/21 HNM Performed at [facility], [address] [place], [place] [address]    Culture STAPHYLOCOCCUS AUREUS (A)  Final   Report Status 03/23/2021 FINAL  Final   Organism ID, Bacteria STAPHYLOCOCCUS AUREUS  Final      Susceptibility   Staphylococcus aureus - MIC*    CIPROFLOXACIN <=0.5 SENSITIVE Sensitive     ERYTHROMYCIN RESISTANT Resistant     GENTAMICIN <=0.5 SENSITIVE Sensitive     OXACILLIN 0.5 SENSITIVE Sensitive     TETRACYCLINE <=1 SENSITIVE Sensitive     VANCOMYCIN <=0.5 SENSITIVE Sensitive     TRIMETH/SULFA <=10 SENSITIVE Sensitive     CLINDAMYCIN RESISTANT Resistant     RIFAMPIN <=0.5 SENSITIVE Sensitive     Inducible Clindamycin POSITIVE Resistant     * STAPHYLOCOCCUS AUREUS  Blood Culture ID Panel  (Reflexed)     Status: Abnormal   Collection Time: 03/20/21  3:52 AM  Result Value Ref Range Status   Enterococcus faecalis NOT DETECTED NOT DETECTED Final   Enterococcus Faecium NOT DETECTED NOT DETECTED Final   Listeria monocytogenes NOT DETECTED NOT DETECTED Final   Staphylococcus species DETECTED (A) NOT DETECTED Final    Comment: CRITICAL RESULT CALLED TO, READ BACK BY AND VERIFIED WITH: ALEX CHAPPELL PHARMD 2225 03/20/21 HNM    Staphylococcus aureus (BCID) DETECTED (A) NOT DETECTED Final    Comment: CRITICAL RESULT CALLED TO, READ BACK BY AND VERIFIED WITH: [place] [id number] 03/20/21 HNM    Staphylococcus epidermidis NOT DETECTED NOT DETECTED Final   Staphylococcus lugdunensis NOT DETECTED NOT DETECTED Final   Streptococcus species NOT DETECTED NOT DETECTED Final   Streptococcus agalactiae NOT DETECTED NOT DETECTED Final   Streptococcus pneumoniae NOT DETECTED NOT DETECTED Final   Streptococcus pyogenes NOT DETECTED NOT DETECTED Final   A.calcoaceticus-baumannii NOT DETECTED NOT DETECTED Final   Bacteroides fragilis NOT DETECTED NOT DETECTED Final   Enterobacterales NOT DETECTED NOT DETECTED Final   Enterobacter cloacae complex NOT DETECTED NOT DETECTED Final   Escherichia coli NOT DETECTED NOT DETECTED Final   Klebsiella aerogenes NOT DETECTED NOT DETECTED Final   Klebsiella oxytoca NOT DETECTED NOT DETECTED Final   Klebsiella pneumoniae NOT DETECTED NOT DETECTED Final   Proteus species NOT DETECTED NOT DETECTED Final   Salmonella  species NOT DETECTED NOT DETECTED Final   Serratia marcescens NOT DETECTED NOT DETECTED Final   Haemophilus influenzae NOT DETECTED NOT DETECTED Final   Neisseria meningitidis NOT DETECTED NOT DETECTED Final   Pseudomonas aeruginosa NOT DETECTED NOT DETECTED Final   Stenotrophomonas maltophilia NOT DETECTED NOT DETECTED Final   Candida albicans NOT DETECTED NOT DETECTED Final   Candida auris NOT DETECTED NOT DETECTED Final   Candida glabrata  NOT DETECTED NOT DETECTED Final   Candida krusei NOT DETECTED NOT DETECTED Final   Candida parapsilosis NOT DETECTED NOT DETECTED Final   Candida tropicalis NOT DETECTED NOT DETECTED Final   Cryptococcus neoformans/gattii NOT DETECTED NOT DETECTED Final   Meth resistant mecA/C and MREJ NOT DETECTED NOT DETECTED Final    Comment: Performed at [facility], [address] [place], [place] [address]  Resp Panel by RT-PCR (Flu A&B, Covid) Nasopharyngeal Swab     Status: None   Collection Time: 03/20/21  4:01 AM   Specimen: Nasopharyngeal Swab; Nasopharyngeal(NP) swabs in vial transport medium  Result Value Ref Range Status   SARS Coronavirus 2 by RT PCR NEGATIVE NEGATIVE Final    Comment: (NOTE) SARS-CoV-2 target nucleic acids are NOT DETECTED.  The SARS-CoV-2 RNA is generally detectable in upper respiratory specimens during the acute phase of infection. The lowest concentration of SARS-CoV-2 viral copies this assay can detect is 138 copies/mL. A negative result does not preclude SARS-Cov-2 infection and should not be used as the sole basis for treatment or other patient management decisions. A negative result may occur with  improper specimen collection/handling, submission of specimen other than nasopharyngeal swab, presence of viral mutation(s) within the areas targeted by this assay, and inadequate number of viral copies(<138 copies/mL). A negative result must be combined with clinical observations, patient history, and epidemiological information. The expected result is Negative.  Fact Sheet for Patients:  EntrepreneurPulse.com.au  Fact Sheet for Healthcare Providers:  IncredibleEmployment.be  This test is no t yet approved or cleared by the [place] FDA and  has been authorized for detection and/or diagnosis of SARS-CoV-2 by FDA under an Emergency Use Authorization (EUA). This EUA will remain  in effect (meaning this test can  be used) for the duration of the COVID-19 declaration under Section 564(b)(1) of the Act, 21 U.S.C.section 360bbb-3(b)(1), unless the authorization is terminated  or revoked sooner.       Influenza A by PCR NEGATIVE NEGATIVE Final   Influenza B by PCR NEGATIVE NEGATIVE Final    Comment: (NOTE) The Xpert Xpress SARS-CoV-2/FLU/RSV plus assay is intended as an aid in the diagnosis of influenza from Nasopharyngeal swab specimens and should not be used as a sole basis for treatment. Nasal washings and aspirates are unacceptable for Xpert Xpress SARS-CoV-2/FLU/RSV testing.  Fact Sheet for Patients: EntrepreneurPulse.com.au  Fact Sheet for Healthcare Providers: IncredibleEmployment.be  This test is not yet approved or cleared by the [place] FDA and has been authorized for detection and/or diagnosis of SARS-CoV-2 by FDA under an Emergency Use Authorization (EUA). This EUA will remain in effect (meaning this test can be used) for the duration of the COVID-19 declaration under Section 564(b)(1) of the Act, 21 U.S.C. section 360bbb-3(b)(1), unless the authorization is terminated or revoked.  Performed at [facility], [address] [place], [place] [address]   Blood culture (routine x 2)     Status: None   Collection Time: 03/20/21 11:43 AM   Specimen: BLOOD  Result Value Ref Range Status   Specimen  Description BLOOD WRIST  Final   Special Requests   Final    BOTTLES DRAWN AEROBIC AND ANAEROBIC Blood Culture results may not be optimal due to an excessive volume of blood received in culture bottles   Culture   Final    NO GROWTH 5 DAYS Performed at [facility], [address], [place], [place] [address]    Report Status 03/25/2021 FINAL  Final  Aerobic/Anaerobic Culture w Gram Stain (surgical/deep wound)     Status: None (Preliminary result)   Collection Time: 03/20/21  8:41 PM   Specimen: PATH Other; Tissue  Result  Value Ref Range Status   Specimen Description   Final    TISSUE Performed at [facility], [address], [place], [place] [address]    Special Requests   Final    RIGHT GREAT TOE Performed at [facility], [address], [place], [place] [address]    Gram Stain   Final    ABUNDANT WBC PRESENT,BOTH PMN AND MONONUCLEAR FEW GRAM POSITIVE COCCI Performed at [facility], [place] [address], [place], [place] [address]    Culture   Final    ABUNDANT STAPHYLOCOCCUS AUREUS NO ANAEROBES ISOLATED; CULTURE IN PROGRESS FOR 5 DAYS    Report Status PENDING  Incomplete   Organism ID, Bacteria STAPHYLOCOCCUS AUREUS  Final      Susceptibility   Staphylococcus aureus - MIC*    CIPROFLOXACIN <=0.5 SENSITIVE Sensitive     ERYTHROMYCIN >=8 RESISTANT Resistant     GENTAMICIN <=0.5 SENSITIVE Sensitive     OXACILLIN 0.5 SENSITIVE Sensitive     TETRACYCLINE <=1 SENSITIVE Sensitive     VANCOMYCIN <=0.5 SENSITIVE Sensitive     TRIMETH/SULFA <=10 SENSITIVE Sensitive     CLINDAMYCIN RESISTANT Resistant     RIFAMPIN <=0.5 SENSITIVE Sensitive     Inducible Clindamycin POSITIVE Resistant     * ABUNDANT STAPHYLOCOCCUS AUREUS  Culture, blood (single) w Reflex to ID Panel     Status: None (Preliminary result)   Collection Time: 03/22/21  4:51 AM   Specimen: BLOOD  Result Value Ref Range Status   Specimen Description BLOOD  RIGHT ARM  Final   Special Requests   Final    BOTTLES DRAWN AEROBIC AND ANAEROBIC Blood Culture results may not be optimal due to an excessive volume of blood received in culture bottles   Culture   Final    NO GROWTH 3 DAYS Performed at [facility], [address] [place], [place] [address]    Report Status PENDING  Incomplete      Imaging Studies   ECHOCARDIOGRAM COMPLETE  Result Date: 03/24/2021    ECHOCARDIOGRAM REPORT   Patient Name:   HYRUM CANTER Date of Exam: 03/24/2021 Medical Rec #:  [id number]      Height:       72.0 in Accession #:     [id number]     Weight:       232.0 lb Date of Birth:  28-Mar-1941      BSA:          2.269 m Patient Age:    73 years       BP:           159/71 mmHg Patient Gender: M              HR:           63 bpm. Exam Location:  [facility] Procedure: 2D Echo, Color Doppler and Cardiac Doppler  Indications:     R78.81 Bacteremia  History:         Patient has no prior history of Echocardiogram examinations.                  CAD, Defibrillator, PVD, S/P TAVR; Risk Factors:Hypertension                  and Diabetes.  Sonographer:     Charmayne Sheer RDCS (AE) Referring Phys:  [place] Diagnosing Phys: Nelva Bush MD  Sonographer Comments: Technically challenging study due to limited acoustic windows. IMPRESSIONS  1. Left ventricular ejection fraction, by estimation, is >55%. The left ventricle has normal function. Left ventricular endocardial border not optimally defined to evaluate regional wall motion. Left ventricular diastolic parameters are indeterminate.  2. Right ventricular systolic function is normal. The right ventricular size is normal.  3. Left atrial size was mildly dilated.  4. The mitral valve was not well visualized. Mild mitral valve regurgitation. No evidence of mitral stenosis.  5. The aortic valve was not well visualized. Aortic valve regurgitation is not visualized. Mild aortic valve stenosis. Aortic valve mean gradient measures 16.0 mmHg.  6. The inferior vena cava is dilated in size with <50% respiratory variability, suggesting right atrial pressure of 15 mmHg. Conclusion(s)/Recommendation(s): No definite evidence of endocarditis. However, acoustic windows are suboptimal and transesophageal echocardiogram should be considered in the setting of implanted cardiac devices and MSSA bacteremia. FINDINGS  Left Ventricle: LV size and wall thickness not well assessed due to suboptimal acoustic windows. Left ventricular ejection fraction, by estimation, is >55%. The left ventricle has normal function. Left  ventricular endocardial border not optimally defined to evaluate regional wall motion. Left ventricular diastolic parameters are indeterminate. Right Ventricle: The right ventricular size is normal. Right vetricular wall thickness was not well visualized. Right ventricular systolic function is normal. Left Atrium: Left atrial size was mildly dilated. Right Atrium: Right atrial size was normal in size. Pericardium: There is no evidence of pericardial effusion. Mitral Valve: The mitral valve was not well visualized. Mild mitral annular calcification. Mild mitral valve regurgitation. No evidence of mitral valve stenosis. MV peak gradient, 6.2 mmHg. The mean mitral valve gradient is 2.0 mmHg. Tricuspid Valve: The tricuspid valve is grossly normal. Tricuspid valve regurgitation is not demonstrated. Aortic Valve: The aortic valve was not well visualized. Aortic valve regurgitation is not visualized. Mild aortic stenosis is present. Aortic valve mean gradient measures 16.0 mmHg. Aortic valve peak gradient measures 27.2 mmHg. Aortic valve area, by VTI  measures 2.40 cm. There is a 29 mm Sapien prosthetic, stented (TAVR) valve present in the aortic position. Pulmonic Valve: The pulmonic valve was not well visualized. Pulmonic valve regurgitation is not visualized. No evidence of pulmonic stenosis. Aorta: The aortic root is normal in size and structure. Pulmonary Artery: The pulmonary artery is not well seen. Venous: The inferior vena cava is dilated in size with less than 50% respiratory variability, suggesting right atrial pressure of 15 mmHg. IAS/Shunts: The interatrial septum was not well visualized. Additional Comments: A device lead is visualized in the right ventricle.  LEFT VENTRICLE PLAX 2D LVIDd:         5.10 cm      Diastology LVIDs:         3.00 cm      LV e' medial:    9.25 cm/s LV PW:         0.90 cm      LV E/e'  medial:  13.3 LV IVS:        0.70 cm      LV e' lateral:   8.70 cm/s LVOT diam:     2.30 cm      LV  E/e' lateral: 14.1 LV SV:         141 LV SV Index:   62 LVOT Area:     4.15 cm  LV Volumes (MOD) LV vol d, MOD A4C: 136.0 ml LV vol s, MOD A4C: 61.7 ml LV SV MOD A4C:     136.0 ml RIGHT VENTRICLE RV Basal diam:  3.20 cm LEFT ATRIUM             Index       RIGHT ATRIUM           Index LA diam:        4.70 cm 2.07 cm/m  RA Area:     17.80 cm LA Vol (A2C):   84.6 ml 37.28 ml/m RA Volume:   48.40 ml  21.33 ml/m LA Vol (A4C):   78.7 ml 34.68 ml/m LA Biplane Vol: 86.4 ml 38.07 ml/m  AORTIC VALVE                    PULMONIC VALVE AV Area (Vmax):    2.32 cm     PV Vmax:       0.76 m/s AV Area (Vmean):   2.30 cm     PV Vmean:      49.300 cm/s AV Area (VTI):     2.40 cm     PV VTI:        0.137 m AV Vmax:           261.00 cm/s  PV Peak grad:  2.3 mmHg AV Vmean:          190.000 cm/s PV Mean grad:  1.0 mmHg AV VTI:            0.588 m AV Peak Grad:      27.2 mmHg AV Mean Grad:      16.0 mmHg LVOT Vmax:         146.00 cm/s LVOT Vmean:        105.000 cm/s LVOT VTI:          0.340 m LVOT/AV VTI ratio: 0.58  AORTA Ao Root diam: 2.30 cm MITRAL VALVE MV Area (PHT): 2.68 cm     SHUNTS MV Area VTI:   3.54 cm     Systemic VTI:  0.34 m MV Peak grad:  6.2 mmHg     Systemic Diam: 2.30 cm MV Mean grad:  2.0 mmHg MV Vmax:       1.25 m/s MV Vmean:      69.1 cm/s MV Decel Time: 283 msec MV E velocity: 123.00 cm/s MV A velocity: 110.00 cm/s MV E/A ratio:  1.12 Harrell Gave End MD Electronically signed by Nelva Bush MD Signature Date/Time: 03/24/2021/3:51:01 PM    Final    [place] EKG SITE RITE  Result Date: 03/24/2021 If Site Rite image not attached, placement could not be confirmed due to current cardiac rhythm.    Medications   Scheduled Meds: . allopurinol  100 mg Oral Daily  . amLODipine  10 mg Oral Daily  . aspirin EC  81 mg Oral Daily  . baclofen  10 mg Oral BID  . carvedilol  6.25 mg Oral QHS  . Chlorhexidine Gluconate Cloth  6 each Topical Daily  . cholecalciferol  5,000 Units Oral Daily  . clopidogrel  75 mg  Oral Daily  . docusate sodium  200 mg Oral BID  . furosemide  20 mg Oral Daily  . gabapentin  100 mg Oral Daily  . insulin aspart  0-5 Units Subcutaneous QHS  . insulin aspart  0-9 Units Subcutaneous TID WC  . insulin aspart  10 Units Subcutaneous TID WC  . insulin glargine  18 Units Subcutaneous QHS  . isosorbide mononitrate  30 mg Oral Daily  . linaclotide  145 mcg Oral Daily  . loratadine  10 mg Oral Daily  . multivitamin with minerals  1 tablet Oral Daily  . pantoprazole  40 mg Oral Daily  . rosuvastatin  40 mg Oral Daily  . sertraline  200 mg Oral Daily  . sodium chloride flush  10-40 mL Intracatheter Q12H  . tamsulosin  0.4 mg Oral Daily   Continuous Infusions: . sodium chloride 10 mL/hr at 03/25/21 0623  .  ceFAZolin (ANCEF) IV 2 g (03/25/21 1259)       LOS: 5 days    Time spent: 30 minutes with > 50% spent at bedside and in coordination of care.    Ezekiel Slocumb, DO [facility]  03/25/2021, 5:36 PM      If 7PM-7AM, please contact night-coverage. How to contact the [facility] Attending or Consulting provider [place] or covering provider during after hours [place], for this patient?    1. Check the care team in [facility] and look for a) attending/consulting TRH provider listed and b) the [facility] [place] team listed 2. Log into [email or web address] and use [place]'s universal password to access. If you do not have the password, please contact the hospital operator. 3. Locate the [facility] provider you are looking for under [facility] and page to a number that you can be directly reached. 4. If you still have difficulty reaching the provider, please page the [facility] (Director on Call) for the Hospitalists listed on amion for assistance.

## 2021-03-25 NOTE — Progress Notes (Signed)
[place] INFECTIOUS DISEASE PROGRESS NOTE Date of Admission:  03/20/2021     ID: Nathan Singleton is a 80 y.o. male with MSSA bacteremia and foot infection Principal Problem:   Cellulitis of great toe, right Active Problems:   Hypertension   PVD (peripheral vascular disease) (HCC)   CAD (coronary artery disease)   Thrombocytopenia (HCC)   Normocytic anemia   COPD (chronic obstructive pulmonary disease) (HCC)   Depression   GERD without esophagitis   Gout   Hyperlipidemia   Type II diabetes mellitus with renal manifestations (HCC)   CKD (chronic kidney disease), stage IIIa   Dizziness   Subjective: No fevers, remains on ancef. Still some foot pain  ROS  Eleven systems are reviewed and negative except per hpi  Medications:  Antibiotics Given (last 72 hours)    Date/Time Action Medication Dose Rate   03/22/21 1401 New Bag/Given   ceFAZolin (ANCEF) IVPB 2g/100 mL premix 2 g 200 mL/hr   03/22/21 2119 New Bag/Given   ceFAZolin (ANCEF) IVPB 2g/100 mL premix 2 g 200 mL/hr   03/23/21 0508 New Bag/Given   ceFAZolin (ANCEF) IVPB 2g/100 mL premix 2 g 200 mL/hr   03/23/21 1346 New Bag/Given   ceFAZolin (ANCEF) IVPB 2g/100 mL premix 2 g 200 mL/hr   03/23/21 2140 New Bag/Given   ceFAZolin (ANCEF) IVPB 2g/100 mL premix 2 g 200 mL/hr   03/24/21 0557 New Bag/Given   ceFAZolin (ANCEF) IVPB 2g/100 mL premix 2 g 200 mL/hr   03/24/21 1303 New Bag/Given   ceFAZolin (ANCEF) IVPB 2g/100 mL premix 2 g 200 mL/hr   03/25/21 [id number] New Bag/Given   ceFAZolin (ANCEF) IVPB 2g/100 mL premix 2 g 200 mL/hr   03/25/21 1259 New Bag/Given   ceFAZolin (ANCEF) IVPB 2g/100 mL premix 2 g 200 mL/hr     . allopurinol  100 mg Oral Daily  . amLODipine  10 mg Oral Daily  . aspirin EC  81 mg Oral Daily  . baclofen  10 mg Oral BID  . carvedilol  6.25 mg Oral QHS  . Chlorhexidine Gluconate Cloth  6 each Topical Daily  . cholecalciferol  5,000 Units Oral Daily  . clopidogrel  75 mg Oral Daily  . docusate  sodium  200 mg Oral BID  . furosemide  20 mg Oral Daily  . gabapentin  100 mg Oral Daily  . insulin aspart  0-5 Units Subcutaneous QHS  . insulin aspart  0-9 Units Subcutaneous TID WC  . insulin aspart  10 Units Subcutaneous TID WC  . insulin glargine  25 Units Subcutaneous QHS  . isosorbide mononitrate  30 mg Oral Daily  . linaclotide  145 mcg Oral Daily  . loratadine  10 mg Oral Daily  . multivitamin with minerals  1 tablet Oral Daily  . pantoprazole  40 mg Oral Daily  . rosuvastatin  40 mg Oral Daily  . sertraline  200 mg Oral Daily  . sodium chloride flush  10-40 mL Intracatheter Q12H  . tamsulosin  0.4 mg Oral Daily    Objective: Vital signs in last 24 hours: Temp:  [98.1 F (36.7 C)-99.7 F (37.6 C)] 99.7 F (37.6 C) (04/12 0842) Pulse Rate:  [59-75] 75 (04/12 0842) Resp:  [16-19] 16 (04/12 0842) BP: (133-161)/(44-89) 140/44 (04/12 0842) SpO2:  [96 %-100 %] 99 % (04/12 0842) Constitutional:  oriented to person, place, and time. appears well-developed and well-nourished. No distress. HOH HENT: [place]/AT, PERRLA, no scleral icterus Mouth/Throat: Oropharynx is clear and moist. No  oropharyngeal exudate.  Cardiovascular: Normal rate, regular rhythm and normal heart sounds. Exam reveals no gallop and no friction rub.  No murmur heard.  Pulmonary/Chest: Effort normal and breath sounds normal. No respiratory distress.  has no wheezes.  Neck = supple, no nuchal rigidity Abdominal: Soft. Bowel sounds are normal.  exhibits no distension. There is no tenderness.  Lymphadenopathy: no cervical adenopathy. No axillary adenopathy Neurological: alert and oriented to person, place, and time.  Skin:foot wrapped post op Psychiatric: a normal mood and affect.  behavior is normal.   Lab Results Recent Labs    03/23/21 0446 03/24/21 0540  WBC 4.0 3.7*  HGB 8.0* 8.4*  HCT 24.4* 24.9*  NA 136 138  K 4.6 4.6  CL 106 107  CO2 23 24  BUN 42* 53*  CREATININE 1.47* 1.54*     Microbiology: Results for orders placed or performed during the hospital encounter of 03/20/21  Blood culture (routine x 2)     Status: Abnormal   Collection Time: 03/20/21  3:52 AM   Specimen: BLOOD LEFT FOREARM  Result Value Ref Range Status   Specimen Description   Final    BLOOD LEFT FOREARM Performed at [facility], [place] [address] [place], [place] [address]    Special Requests   Final    BOTTLES DRAWN AEROBIC AND ANAEROBIC Blood Culture adequate volume Performed at [facility], [address] [place], [place] [address]    Culture  Setup Time   Final    AEROBIC BOTTLE ONLY GRAM POSITIVE COCCI Organism ID to follow CRITICAL RESULT CALLED TO, READ BACK BY AND VERIFIED WITH: ALEX CHAPPELL PHARMD 2225 03/20/21 HNM Performed at [facility], [place] [place],  [address]    Culture STAPHYLOCOCCUS AUREUS (A)  Final   Report Status 03/23/2021 FINAL  Final   Organism ID, Bacteria STAPHYLOCOCCUS AUREUS  Final      Susceptibility   Staphylococcus aureus - MIC*    CIPROFLOXACIN <=0.5 SENSITIVE Sensitive     ERYTHROMYCIN RESISTANT Resistant     GENTAMICIN <=0.5 SENSITIVE Sensitive     OXACILLIN 0.5 SENSITIVE Sensitive     TETRACYCLINE <=1 SENSITIVE Sensitive     VANCOMYCIN <=0.5 SENSITIVE Sensitive     TRIMETH/SULFA <=10 SENSITIVE Sensitive     CLINDAMYCIN RESISTANT Resistant     RIFAMPIN <=0.5 SENSITIVE Sensitive     Inducible Clindamycin POSITIVE Resistant     * STAPHYLOCOCCUS AUREUS  Blood Culture ID Panel (Reflexed)     Status: Abnormal   Collection Time: 03/20/21  3:52 AM  Result Value Ref Range Status   Enterococcus faecalis NOT DETECTED NOT DETECTED Final   Enterococcus Faecium NOT DETECTED NOT DETECTED Final   Listeria monocytogenes NOT DETECTED NOT DETECTED Final   Staphylococcus species DETECTED (A) NOT DETECTED Final    Comment: CRITICAL RESULT CALLED TO, READ BACK BY AND VERIFIED WITH: ALEX CHAPPELL PHARMD 2225 03/20/21  HNM    Staphylococcus aureus (BCID) DETECTED (A) NOT DETECTED Final    Comment: CRITICAL RESULT CALLED TO, READ BACK BY AND VERIFIED WITH: ALEX CHAPPELL PHARMD 2225 03/20/21 HNM    Staphylococcus epidermidis NOT DETECTED NOT DETECTED Final   Staphylococcus lugdunensis NOT DETECTED NOT DETECTED Final   Streptococcus species NOT DETECTED NOT DETECTED Final   Streptococcus agalactiae NOT DETECTED NOT DETECTED Final   Streptococcus pneumoniae NOT DETECTED NOT DETECTED Final   Streptococcus pyogenes NOT DETECTED NOT DETECTED Final   A.calcoaceticus-baumannii NOT DETECTED NOT DETECTED Final  Bacteroides fragilis NOT DETECTED NOT DETECTED Final   Enterobacterales NOT DETECTED NOT DETECTED Final   Enterobacter cloacae complex NOT DETECTED NOT DETECTED Final   Escherichia coli NOT DETECTED NOT DETECTED Final   Klebsiella aerogenes NOT DETECTED NOT DETECTED Final   Klebsiella oxytoca NOT DETECTED NOT DETECTED Final   Klebsiella pneumoniae NOT DETECTED NOT DETECTED Final   Proteus species NOT DETECTED NOT DETECTED Final   Salmonella species NOT DETECTED NOT DETECTED Final   Serratia marcescens NOT DETECTED NOT DETECTED Final   Haemophilus influenzae NOT DETECTED NOT DETECTED Final   Neisseria meningitidis NOT DETECTED NOT DETECTED Final   Pseudomonas aeruginosa NOT DETECTED NOT DETECTED Final   Stenotrophomonas maltophilia NOT DETECTED NOT DETECTED Final   Candida albicans NOT DETECTED NOT DETECTED Final   Candida auris NOT DETECTED NOT DETECTED Final   Candida glabrata NOT DETECTED NOT DETECTED Final   Candida krusei NOT DETECTED NOT DETECTED Final   Candida parapsilosis NOT DETECTED NOT DETECTED Final   Candida tropicalis NOT DETECTED NOT DETECTED Final   Cryptococcus neoformans/gattii NOT DETECTED NOT DETECTED Final   Meth resistant mecA/C and MREJ NOT DETECTED NOT DETECTED Final    Comment: Performed at [facility], [address] [place], [place] [address]  Resp Panel by  RT-PCR (Flu A&B, Covid) Nasopharyngeal Swab     Status: None   Collection Time: 03/20/21  4:01 AM   Specimen: Nasopharyngeal Swab; Nasopharyngeal(NP) swabs in vial transport medium  Result Value Ref Range Status   SARS Coronavirus 2 by RT PCR NEGATIVE NEGATIVE Final    Comment: (NOTE) SARS-CoV-2 target nucleic acids are NOT DETECTED.  The SARS-CoV-2 RNA is generally detectable in upper respiratory specimens during the acute phase of infection. The lowest concentration of SARS-CoV-2 viral copies this assay can detect is 138 copies/mL. A negative result does not preclude SARS-Cov-2 infection and should not be used as the sole basis for treatment or other patient management decisions. A negative result may occur with  improper specimen collection/handling, submission of specimen other than nasopharyngeal swab, presence of viral mutation(s) within the areas targeted by this assay, and inadequate number of viral copies(<138 copies/mL). A negative result must be combined with clinical observations, patient history, and epidemiological information. The expected result is Negative.  Fact Sheet for Patients:  EntrepreneurPulse.com.au  Fact Sheet for Healthcare Providers:  IncredibleEmployment.be  This test is no t yet approved or cleared by the [place] FDA and  has been authorized for detection and/or diagnosis of SARS-CoV-2 by FDA under an Emergency Use Authorization (EUA). This EUA will remain  in effect (meaning this test can be used) for the duration of the COVID-19 declaration under Section 564(b)(1) of the Act, 21 U.S.C.section 360bbb-3(b)(1), unless the authorization is terminated  or revoked sooner.       Influenza A by PCR NEGATIVE NEGATIVE Final   Influenza B by PCR NEGATIVE NEGATIVE Final    Comment: (NOTE) The Xpert Xpress SARS-CoV-2/FLU/RSV plus assay is intended as an aid in the diagnosis of influenza from Nasopharyngeal swab  specimens and should not be used as a sole basis for treatment. Nasal washings and aspirates are unacceptable for Xpert Xpress SARS-CoV-2/FLU/RSV testing.  Fact Sheet for Patients: EntrepreneurPulse.com.au  Fact Sheet for Healthcare Providers: IncredibleEmployment.be  This test is not yet approved or cleared by the [place] FDA and has been authorized for detection and/or diagnosis of SARS-CoV-2 by FDA under an Emergency Use Authorization (EUA). This EUA will remain in effect (meaning this test  can be used) for the duration of the COVID-19 declaration under Section 564(b)(1) of the Act, 21 U.S.C. section 360bbb-3(b)(1), unless the authorization is terminated or revoked.  Performed at [facility], [address] [place], [place] [address]   Blood culture (routine x 2)     Status: None   Collection Time: 03/20/21 11:43 AM   Specimen: BLOOD  Result Value Ref Range Status   Specimen Description BLOOD WRIST  Final   Special Requests   Final    BOTTLES DRAWN AEROBIC AND ANAEROBIC Blood Culture results may not be optimal due to an excessive volume of blood received in culture bottles   Culture   Final    NO GROWTH 5 DAYS Performed at [facility], [address] [place], [place] [address]    Report Status 03/25/2021 FINAL  Final  Aerobic/Anaerobic Culture w Gram Stain (surgical/deep wound)     Status: None (Preliminary result)   Collection Time: 03/20/21  8:41 PM   Specimen: PATH Other; Tissue  Result Value Ref Range Status   Specimen Description   Final    TISSUE Performed at [facility], [address], [place], [place] [address]    Special Requests   Final    RIGHT GREAT TOE Performed at [facility], [address] [place], [place] [address]    Gram Stain   Final    ABUNDANT WBC PRESENT,BOTH PMN AND MONONUCLEAR FEW GRAM POSITIVE COCCI Performed at [facility], [place] [address], [place], [place] [address]    Culture   Final    ABUNDANT STAPHYLOCOCCUS AUREUS NO ANAEROBES ISOLATED; CULTURE IN PROGRESS FOR 5 DAYS    Report Status PENDING  Incomplete   Organism ID, Bacteria STAPHYLOCOCCUS AUREUS  Final      Susceptibility   Staphylococcus aureus - MIC*    CIPROFLOXACIN <=0.5 SENSITIVE Sensitive     ERYTHROMYCIN >=8 RESISTANT Resistant     GENTAMICIN <=0.5 SENSITIVE Sensitive     OXACILLIN 0.5 SENSITIVE Sensitive     TETRACYCLINE <=1 SENSITIVE Sensitive     VANCOMYCIN <=0.5 SENSITIVE Sensitive     TRIMETH/SULFA <=10 SENSITIVE Sensitive     CLINDAMYCIN RESISTANT Resistant     RIFAMPIN <=0.5 SENSITIVE Sensitive     Inducible Clindamycin POSITIVE Resistant     * ABUNDANT STAPHYLOCOCCUS AUREUS  Culture, blood (single) w Reflex to ID Panel     Status: None (Preliminary result)   Collection Time: 03/22/21  4:51 AM   Specimen: BLOOD  Result Value Ref Range Status   Specimen Description BLOOD  RIGHT ARM  Final   Special Requests   Final    BOTTLES DRAWN AEROBIC AND ANAEROBIC Blood Culture results may not be optimal due to an excessive volume of blood received in culture bottles   Culture   Final    NO GROWTH 3 DAYS Performed at [facility], [address], [place], [place] [address]    Report Status PENDING  Incomplete    Studies/Results: ECHOCARDIOGRAM COMPLETE  Result Date: 03/24/2021    ECHOCARDIOGRAM REPORT   Patient Name:   Nathan Singleton Date of Exam: 03/24/2021 Medical Rec #:  [id number]      Height:       72.0 in Accession #:    [id number]     Weight:       232.0 lb Date of Birth:  23-May-1941      BSA:          2.269 m  Patient Age:    3 years       BP:           159/71 mmHg Patient Gender: M              HR:           63 bpm. Exam Location:  [facility] Procedure: 2D Echo, Color Doppler and Cardiac Doppler Indications:     R78.81 Bacteremia  History:         Patient has no prior history of Echocardiogram examinations.                  CAD, Defibrillator,  PVD, S/P TAVR; Risk Factors:Hypertension                  and Diabetes.  Sonographer:     Charmayne Sheer RDCS (AE) Referring Phys:  [place] Diagnosing Phys: Nelva Bush Singleton  Sonographer Comments: Technically challenging study due to limited acoustic windows. IMPRESSIONS  1. Left ventricular ejection fraction, by estimation, is >55%. The left ventricle has normal function. Left ventricular endocardial border not optimally defined to evaluate regional wall motion. Left ventricular diastolic parameters are indeterminate.  2. Right ventricular systolic function is normal. The right ventricular size is normal.  3. Left atrial size was mildly dilated.  4. The mitral valve was not well visualized. Mild mitral valve regurgitation. No evidence of mitral stenosis.  5. The aortic valve was not well visualized. Aortic valve regurgitation is not visualized. Mild aortic valve stenosis. Aortic valve mean gradient measures 16.0 mmHg.  6. The inferior vena cava is dilated in size with <50% respiratory variability, suggesting right atrial pressure of 15 mmHg. Conclusion(s)/Recommendation(s): No definite evidence of endocarditis. However, acoustic windows are suboptimal and transesophageal echocardiogram should be considered in the setting of implanted cardiac devices and MSSA bacteremia. FINDINGS  Left Ventricle: LV size and wall thickness not well assessed due to suboptimal acoustic windows. Left ventricular ejection fraction, by estimation, is >55%. The left ventricle has normal function. Left ventricular endocardial border not optimally defined to evaluate regional wall motion. Left ventricular diastolic parameters are indeterminate. Right Ventricle: The right ventricular size is normal. Right vetricular wall thickness was not well visualized. Right ventricular systolic function is normal. Left Atrium: Left atrial size was mildly dilated. Right Atrium: Right atrial size was normal in size. Pericardium: There is no  evidence of pericardial effusion. Mitral Valve: The mitral valve was not well visualized. Mild mitral annular calcification. Mild mitral valve regurgitation. No evidence of mitral valve stenosis. MV peak gradient, 6.2 mmHg. The mean mitral valve gradient is 2.0 mmHg. Tricuspid Valve: The tricuspid valve is grossly normal. Tricuspid valve regurgitation is not demonstrated. Aortic Valve: The aortic valve was not well visualized. Aortic valve regurgitation is not visualized. Mild aortic stenosis is present. Aortic valve mean gradient measures 16.0 mmHg. Aortic valve peak gradient measures 27.2 mmHg. Aortic valve area, by VTI  measures 2.40 cm. There is a 29 mm Sapien prosthetic, stented (TAVR) valve present in the aortic position. Pulmonic Valve: The pulmonic valve was not well visualized. Pulmonic valve regurgitation is not visualized. No evidence of pulmonic stenosis. Aorta: The aortic root is normal in size and structure. Pulmonary Artery: The pulmonary artery is not well seen. Venous: The inferior vena cava is dilated in size with less than 50% respiratory variability, suggesting right atrial pressure of 15 mmHg. IAS/Shunts: The interatrial septum was not well visualized. Additional Comments: A device lead is visualized in  the right ventricle.  LEFT VENTRICLE PLAX 2D LVIDd:         5.10 cm      Diastology LVIDs:         3.00 cm      LV e' medial:    9.25 cm/s LV PW:         0.90 cm      LV E/e' medial:  13.3 LV IVS:        0.70 cm      LV e' lateral:   8.70 cm/s LVOT diam:     2.30 cm      LV E/e' lateral: 14.1 LV SV:         141 LV SV Index:   62 LVOT Area:     4.15 cm  LV Volumes (MOD) LV vol d, MOD A4C: 136.0 ml LV vol s, MOD A4C: 61.7 ml LV SV MOD A4C:     136.0 ml RIGHT VENTRICLE RV Basal diam:  3.20 cm LEFT ATRIUM             Index       RIGHT ATRIUM           Index LA diam:        4.70 cm 2.07 cm/m  RA Area:     17.80 cm LA Vol (A2C):   84.6 ml 37.28 ml/m RA Volume:   48.40 ml  21.33 ml/m LA Vol (A4C):    78.7 ml 34.68 ml/m LA Biplane Vol: 86.4 ml 38.07 ml/m  AORTIC VALVE                    PULMONIC VALVE AV Area (Vmax):    2.32 cm     PV Vmax:       0.76 m/s AV Area (Vmean):   2.30 cm     PV Vmean:      49.300 cm/s AV Area (VTI):     2.40 cm     PV VTI:        0.137 m AV Vmax:           261.00 cm/s  PV Peak grad:  2.3 mmHg AV Vmean:          190.000 cm/s PV Mean grad:  1.0 mmHg AV VTI:            0.588 m AV Peak Grad:      27.2 mmHg AV Mean Grad:      16.0 mmHg LVOT Vmax:         146.00 cm/s LVOT Vmean:        105.000 cm/s LVOT VTI:          0.340 m LVOT/AV VTI ratio: 0.58  AORTA Ao Root diam: 2.30 cm MITRAL VALVE MV Area (PHT): 2.68 cm     SHUNTS MV Area VTI:   3.54 cm     Systemic VTI:  0.34 m MV Peak grad:  6.2 mmHg     Systemic Diam: 2.30 cm MV Mean grad:  2.0 mmHg MV Vmax:       1.25 m/s MV Vmean:      69.1 cm/s MV Decel Time: 283 msec MV E velocity: 123.00 cm/s MV A velocity: 110.00 cm/s MV E/A ratio:  1.12 Nathan Singleton Electronically signed by Nelva Bush Singleton Signature Date/Time: 03/24/2021/3:51:01 PM    Final    [place] EKG SITE RITE  Result Date: 03/24/2021 If Site Rite image not attached, placement could not be  confirmed due to current cardiac rhythm.   Assessment/Plan: Nathan Singleton is a 80 y.o. 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 Now readmitted with dizziness, hypotension and found to have MSSA bacteremia. Now s/p repeat surgery with toe amputation.  He is relatively stable and no signs of metatatic infection however he has a bioprosthetic valve, PPM, bil TKR and R shoulder replacment   April 11th -he has been afebrile over the last few days and his white count 3.7.  He remains just on cefazolin.  Follow-up blood cultures done April 7 and 9 remain negative.  CRP was noted to be 11 sed rate 70 on April 7.  Culture from his surgery on the  seventh grew methicillin sensitive staph aureus as well.  He has been seen by surgery and there is some concern he may need further surgical debridement given peripheral vascular disease.  He has had his echocardiogram but is not read yet.  Recommendations FU BCX neg For repeat LE angio Neg TTE -  Pending consult cardiology for TEE (has bioprosthetic valve and Pacemaker so high risk of endocarditis). Will need aggressive treatment given the prosthetic joints and the PPM wires and prosthetic valve Cont  cefazolin. Thank you very much for the consult. Will follow with you.  Leonel Ramsay   03/25/2021, 1:49 PM

## 2021-03-25 NOTE — Plan of Care (Signed)

## 2021-03-25 NOTE — Progress Notes (Signed)
Inpatient Diabetes Program Recommendations  AACE/ADA: New Consensus Statement on Inpatient Glycemic Control (2015)  Target Ranges:  Prepandial:   less than 140 mg/dL      Peak postprandial:   less than 180 mg/dL (1-2 hours)      Critically ill patients:  140 - 180 mg/dL   Results for Nathan Singleton, Nathan Singleton (MRN [id number]) as of 03/25/2021 08:59  Ref. Range 03/24/2021 07:45 03/24/2021 11:43 03/24/2021 16:19 03/24/2021 20:50  Glucose-Capillary Latest Ref Range: 70 - 99 mg/dL 122 (H)  10 units NOVOLOG  191 (H)  12 units NOVOLOG  170 (H)  13 units NOVOLOG  101 (H)    25 units LANTUS   Results for Nathan Singleton, Nathan Singleton (MRN [id number]) as of 03/25/2021 08:59  Ref. Range 03/25/2021 08:00  Glucose-Capillary Latest Ref Range: 70 - 99 mg/dL 67 (L)    Home DM Meds: Lantus 25 units QHS       Victoza 1.8 mg Daily   Current Orders: Lantus 25 units QHS      Novolog Sensitive Correction Scale/ SSI (0-9 units) TID AC + HS      Novolog 10 units TID with meals   MD- Note patient with Hypoglycemia this AM (CBG 67)  Please consider:  Reduce Lantus slightly to 20 units QHS     --Will follow patient during hospitalization--  Wyn Quaker RN, MSN, CDE Diabetes Coordinator Inpatient Glycemic Control Team Team Pager: [phone] (8a-5p)

## 2021-03-25 NOTE — Progress Notes (Signed)
Physical Therapy Treatment Patient Details Name: Nathan Singleton MRN: [id number] DOB: 20-Dec-1940 Today's Date: 03/25/2021    History of Present Illness Nathan Singleton is a 71yoM who comes to [facility] on 03/20/21 c dizziness and worsening Right foot pain- pt admitted with cellulitis of the right hallux.  Pt is s/p surgical debridement, arhtroplasty c podiaty 3/8. Pt taken back to OR with podiatry on 4/7 for hallux amputation. Patient orders for AMB in surgical shoe, heel weight bearing. PMH: poorly controlled DM2, aortic stenosis s/p TAVR, SSS s/p PM, HTN, chronic anemia. 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 No prior falls history.    PT Comments    Pt did well with PT session, he showed good effort and willingness to participate.  He has bathroom privileges and heel-only WBing/with post-op boot.  He was safe and eager to ambulate, did not have a lot of fatigue or increased pain.  Pt likely to have further I&D later this week.    Follow Up Recommendations  Home health PT;Supervision for mobility/OOB     Equipment Recommendations  None recommended by PT    Recommendations for Other Services       Precautions / Restrictions Precautions Precautions: ICD/Pacemaker;Fall Other Brace: post-op shoe Restrictions Weight Bearing Restrictions: Yes RLE Weight Bearing: Partial weight bearing (heel only)    Mobility  Bed Mobility Overal bed mobility: Modified Independent Bed Mobility: Supine to Sit;Sit to Supine     Supine to sit: Modified independent (Device/Increase time)          Transfers Overall transfer level: Needs assistance Equipment used: Rolling walker (2 wheeled) Transfers: Sit to/from Stand Sit to Stand: Min guard;From elevated surface         General transfer comment: even from low bed height pt showed good ability to get himself to standing w/o direct assist  Ambulation/Gait Ambulation/Gait assistance: Min guard Gait Distance (Feet): 50  Feet Assistive device: Rolling walker (2 wheeled)       General Gait Details: Wearing post-op shoe, reviewed WB precautions - pt verbalized and demonstrated understanding of PWB precautions. He was able to safely and confidently ambulate in room w/o issue.  Showed good tolerance and safety.   Stairs             Wheelchair Mobility    Modified Rankin (Stroke Patients Only)       Balance Overall balance assessment: Modified Independent   Sitting balance-Leahy Scale: Normal       Standing balance-Leahy Scale: Good Standing balance comment: good balance/safety with walker                            Cognition Arousal/Alertness: Awake/alert Behavior During Therapy: WFL for tasks assessed/performed Overall Cognitive Status: Within Functional Limits for tasks assessed                                        Exercises General Exercises - Lower Extremity Ankle Circles/Pumps: AROM;20 reps Long Arc Quad: Strengthening;10 reps Heel Slides: Strengthening;10 reps Hip ABduction/ADduction: Strengthening;10 reps Hip Flexion/Marching: Strengthening;10 reps    General Comments        Pertinent Vitals/Pain Faces Pain Scale: Hurts little more Pain Location: R foot    Home Living  Prior Function            PT Goals (current goals can now be found in the care plan section) Progress towards PT goals: Progressing toward goals    Frequency    Min 2X/week      PT Plan Frequency needs to be updated    Co-evaluation              AM-PAC PT "6 Clicks" Mobility   Outcome Measure  Help needed turning from your back to your side while in a flat bed without using bedrails?: None Help needed moving from lying on your back to sitting on the side of a flat bed without using bedrails?: A Little Help needed moving to and from a bed to a chair (including a wheelchair)?: A Little Help needed standing up from a chair  using your arms (e.g., wheelchair or bedside chair)?: A Little Help needed to walk in hospital room?: A Little Help needed climbing 3-5 steps with a railing? : A Little 6 Click Score: 19    End of Session Equipment Utilized During Treatment: Gait belt Activity Tolerance: Patient tolerated treatment well;Patient limited by pain Patient left: with chair alarm set;with call bell/phone within reach Nurse Communication: Mobility status PT Visit Diagnosis: Other abnormalities of gait and mobility (R26.89);Difficulty in walking, not elsewhere classified (R26.2);Unsteadiness on feet (R26.81);Muscle weakness (generalized) (M62.81);Pain Pain - Right/Left: Right Pain - part of body: Ankle and joints of foot     Time: [id number] PT Time Calculation (min) (ACUTE ONLY): 25 min  Charges:  $Gait Training: 8-22 mins $Therapeutic Exercise: 8-22 mins                     Kreg Shropshire, DPT 03/25/2021, 12:08 PM

## 2021-03-26 ENCOUNTER — Inpatient Hospital Stay (HOSPITAL_COMMUNITY)
Admit: 2021-03-26 | Discharge: 2021-03-26 | Disposition: A | Discharge status: Home / Self Care | Payer: 59 | Attending: Physician Assistant | Admitting: Physician Assistant

## 2021-03-26 ENCOUNTER — Other Ambulatory Visit (INDEPENDENT_AMBULATORY_CARE_PROVIDER_SITE_OTHER): Payer: Self-pay | Admitting: Vascular Surgery

## 2021-03-26 ENCOUNTER — Encounter
Admission: EM | Disposition: A | Discharge status: Home / Self Care | Payer: Self-pay | Source: Home / Self Care | Attending: Internal Medicine

## 2021-03-26 DIAGNOSIS — R7881 Bacteremia: Secondary | ICD-10-CM

## 2021-03-26 DIAGNOSIS — Z794 Long term (current) use of insulin: Secondary | ICD-10-CM

## 2021-03-26 DIAGNOSIS — L03031 Cellulitis of right toe: Secondary | ICD-10-CM | POA: Diagnosis not present

## 2021-03-26 DIAGNOSIS — B9561 Methicillin susceptible Staphylococcus aureus infection as the cause of diseases classified elsewhere: Secondary | ICD-10-CM

## 2021-03-26 DIAGNOSIS — R42 Dizziness and giddiness: Secondary | ICD-10-CM | POA: Diagnosis not present

## 2021-03-26 DIAGNOSIS — I739 Peripheral vascular disease, unspecified: Secondary | ICD-10-CM | POA: Diagnosis not present

## 2021-03-26 DIAGNOSIS — E1122 Type 2 diabetes mellitus with diabetic chronic kidney disease: Secondary | ICD-10-CM

## 2021-03-26 DIAGNOSIS — D638 Anemia in other chronic diseases classified elsewhere: Secondary | ICD-10-CM

## 2021-03-26 DIAGNOSIS — I340 Nonrheumatic mitral (valve) insufficiency: Secondary | ICD-10-CM

## 2021-03-26 HISTORY — PX: TEE WITHOUT CARDIOVERSION: SHX5443

## 2021-03-26 LAB — CBC
HCT: 25.9 % — ABNORMAL LOW (ref 39.0–52.0)
Hemoglobin: 8.6 g/dL — ABNORMAL LOW (ref 13.0–17.0)
MCH: 30.5 pg (ref 26.0–34.0)
MCHC: 33.2 g/dL (ref 30.0–36.0)
MCV: 91.8 fL (ref 80.0–100.0)
Platelets: 146 10*3/uL — ABNORMAL LOW (ref 150–400)
RBC: 2.82 MIL/uL — ABNORMAL LOW (ref 4.22–5.81)
RDW: 13.6 % (ref 11.5–15.5)
WBC: 3.5 10*3/uL — ABNORMAL LOW (ref 4.0–10.5)
nRBC: 0 % (ref 0.0–0.2)

## 2021-03-26 LAB — GLUCOSE, CAPILLARY
Glucose-Capillary: 106 mg/dL — ABNORMAL HIGH (ref 70–99)
Glucose-Capillary: 97 mg/dL (ref 70–99)
Glucose-Capillary: 96 mg/dL (ref 70–99)
Glucose-Capillary: 109 mg/dL — ABNORMAL HIGH (ref 70–99)
Glucose-Capillary: 377 mg/dL — ABNORMAL HIGH (ref 70–99)

## 2021-03-26 LAB — BASIC METABOLIC PANEL
Anion gap: 10 (ref 5–15)
BUN: 56 mg/dL — ABNORMAL HIGH (ref 8–23)
CO2: 24 mmol/L (ref 22–32)
Calcium: 8.5 mg/dL — ABNORMAL LOW (ref 8.9–10.3)
Chloride: 104 mmol/L (ref 98–111)
Creatinine, Ser: 1.34 mg/dL — ABNORMAL HIGH (ref 0.61–1.24)
GFR, Estimated: 54 mL/min — ABNORMAL LOW (ref >60)
Glucose, Bld: 84 mg/dL (ref 70–99)
Potassium: 4.2 mmol/L (ref 3.5–5.1)
Sodium: 138 mmol/L (ref 135–145)

## 2021-03-26 LAB — AEROBIC/ANAEROBIC CULTURE W GRAM STAIN (SURGICAL/DEEP WOUND)

## 2021-03-26 SURGERY — ECHOCARDIOGRAM, TRANSESOPHAGEAL
Anesthesia: Moderate Sedation

## 2021-03-26 MED ORDER — AMLODIPINE BESYLATE 5 MG PO TABS
5.0000 mg | ORAL_TABLET | Freq: Every day | ORAL | Status: DC
  Administered 2021-03-27 – 2021-03-28 (×2): 5 mg via ORAL
  Filled 2021-03-26 (×2): qty 1

## 2021-03-26 MED ORDER — FENTANYL CITRATE (PF) 100 MCG/2ML IJ SOLN
INTRAMUSCULAR | Status: AC
  Filled 2021-03-26: qty 2

## 2021-03-26 MED ORDER — MIDAZOLAM HCL 5 MG/5ML IJ SOLN
INTRAMUSCULAR | Status: AC | PRN
  Administered 2021-03-26 (×2): 1 mg via INTRAVENOUS

## 2021-03-26 MED ORDER — SODIUM CHLORIDE 0.9 % IV SOLN: INTRAVENOUS | Status: DC

## 2021-03-26 MED ORDER — MIDAZOLAM HCL 5 MG/5ML IJ SOLN
INTRAMUSCULAR | Status: AC
  Filled 2021-03-26: qty 5

## 2021-03-26 MED ORDER — FENTANYL CITRATE (PF) 100 MCG/2ML IJ SOLN
INTRAMUSCULAR | Status: AC | PRN
  Administered 2021-03-26 (×2): 25 ug via INTRAVENOUS

## 2021-03-26 MED ORDER — LIDOCAINE VISCOUS HCL 2 % MT SOLN
OROMUCOSAL | Status: AC
  Filled 2021-03-26: qty 15

## 2021-03-26 MED ORDER — SODIUM CHLORIDE FLUSH 0.9 % IV SOLN
INTRAVENOUS | Status: AC
  Filled 2021-03-26: qty 10

## 2021-03-26 NOTE — Progress Notes (Signed)
Day of Surgery   Subjective/Chief Complaint: Patient seen.  Overall doing okay other than still some pain in his left foot.   Objective: Vital signs in last 24 hours: Temp:  [97.5 F (36.4 C)-98.4 F (36.9 C)] 98.3 F (36.8 C) (04/13 0805) Pulse Rate:  [61-70] 61 (04/13 0805) Resp:  [15-19] 19 (04/13 0805) BP: (129-156)/(59-82) 129/82 (04/13 0805) SpO2:  [99 %-100 %] 99 % (04/13 0805) Last BM Date: 03/25/21  Intake/Output from previous day: 04/12 0701 - 04/13 0700 In: 360 [P.O.:360] Out: -  Intake/Output this shift: Total I/O In: 30 [I.V.:30] Out: -   Bandage on the right foot is dry and intact.  Upon removal there is no significant drainage noted on the bandaging today.  The incision is well coapted with some hemorrhagic appearance along the incision line but does not clearly appear to have necrotic edges.  Dorsal portion of the incision is drier today with no significant drainage or expressible fluid.    Lab Results:  Recent Labs    03/24/21 0540 03/26/21 0426  WBC 3.7* 3.5*  HGB 8.4* 8.6*  HCT 24.9* 25.9*  PLT 118* 146*   BMET Recent Labs    03/24/21 0540 03/26/21 0426  NA 138 138  K 4.6 4.2  CL 107 104  CO2 24 24  GLUCOSE 107* 84  BUN 53* 56*  CREATININE 1.54* 1.34*  CALCIUM 8.6* 8.5*   PT/INR No results for input(s): LABPROT, INR in the last 72 hours. ABG No results for input(s): PHART, HCO3 in the last 72 hours.  Invalid input(s): PCO2, PO2  Studies/Results: No results found.  Anti-infectives: Anti-infectives (From admission, onward)   Start     Dose/Rate Route Frequency Ordered Stop   03/21/21 2200  [MAR Hold]  ceFAZolin (ANCEF) IVPB 2g/100 mL premix        (MAR Hold since Wed 03/26/2021 at 1319.Hold Reason: Transfer to a Procedural area.)   2 g 200 mL/hr over 30 Minutes Intravenous Every 8 hours 03/21/21 1130     03/21/21 0800  vancomycin (VANCOREADY) IVPB 1000 mg/200 mL  Status:  Discontinued        1,000 mg 200 mL/hr over 60 Minutes  Intravenous Every 24 hours 03/20/21 1515 03/21/21 1130   03/20/21 0800  cefTRIAXone (ROCEPHIN) 2 g in sodium chloride 0.9 % 100 mL IVPB  Status:  Discontinued        2 g 200 mL/hr over 30 Minutes Intravenous Every 24 hours 03/20/21 0727 03/21/21 1130   03/20/21 0800  metroNIDAZOLE (FLAGYL) IVPB 500 mg  Status:  Discontinued        500 mg 100 mL/hr over 60 Minutes Intravenous Every 8 hours 03/20/21 0727 03/21/21 1130   03/20/21 0800  vancomycin (VANCOCIN) IVPB 1000 mg/200 mL premix  Status:  Discontinued        1,000 mg 200 mL/hr over 60 Minutes Intravenous Every 24 hours 03/20/21 0732 03/20/21 1515   03/20/21 0400  piperacillin-tazobactam (ZOSYN) IVPB 3.375 g        3.375 g 100 mL/hr over 30 Minutes Intravenous  Once 03/20/21 0353 03/20/21 0444   03/20/21 0400  vancomycin (VANCOCIN) IVPB 1000 mg/200 mL premix        1,000 mg 200 mL/hr over 60 Minutes Intravenous  Once 03/20/21 0353 03/20/21 0552      Assessment/Plan: s/p Procedure(s): TRANSESOPHAGEAL ECHOCARDIOGRAM (TEE) (N/A) Assessment: Stable status post amputation right hallux.   Plan: Betadine and a sterile bandage reapplied to the right foot.  Patient will  keep this dry and intact.  Discussed that I would like to follow him up in approximately 1 week for reevaluation.  Patient has vascular procedure scheduled for tomorrow.  At this point the foot looks stable enough that I think we will not plan on any further debridement of the right foot and patient should be stable for discharge after his vascular procedure.  Podiatry will go ahead and sign off at this point.  LOS: 6 days    Durward Fortes 03/26/2021

## 2021-03-26 NOTE — Progress Notes (Signed)
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  TEE neg  Culture results MSSA  LABS Lab Results  Component Value Date   CREATININE 1.34 (H) 03/26/2021   Lab Results  Component Value Date   WBC 3.5 (L) 03/26/2021   HGB 8.6 (L) 03/26/2021   HCT 25.9 (L) 03/26/2021   MCV 91.8 03/26/2021   PLT 146 (L) 03/26/2021   Lab Results  Component Value Date   ESRSEDRATE 70 (H) 03/20/2021   Lab Results  Component Value Date   CRP 11.6 (H) 03/20/2021    Allergies:  Allergies  Allergen Reactions  . Atorvastatin Other (See Comments)    "bad for kidneys" Cramping   . Celecoxib Other (See Comments)    Other reaction(s): Other (See Comments) Kidney Problem Kidney Problem   . Dicyclomine Hcl Other (See Comments)    Stomach cramps  . Doxycycline Other (See Comments)    Per wife (per RN)- pt gets severe stomach pains even when takes with food and PCP said to not take    . Metformin Other (See Comments)    Discharge antibiotics Cefazolin      2 grams every  8 hours  PICC Care per protocol Labs weekly while on IV antibiotics -FAX weekly labs to [phone] CBC w diff   Comprehensive met panel CRP   Planned duration of antibiotics 4 weeks   Stop date May 7 Follow up clinic date 3 weeks   Leonel Ramsay, MD

## 2021-03-26 NOTE — Progress Notes (Addendum)
Patient ID: Nathan Singleton, male   DOB: 12-03-1941, 80 y.o.   MRN: [id number] [facility] PROGRESS NOTE  Nathan Singleton [id number] DOB: 1941-11-04 DOA: 03/20/2021 PCP: Ranae Plumber, PA  HPI/Subjective: Patient seen before TEE.  He was feeling okay.  Having some pain in his foot just took some pain medications.  Admitted with infection of the foot and found to have positive blood culture with staph aureus.  Objective: Vitals:   03/26/21 1400 03/26/21 1410  BP: 127/67 115/61  Pulse: 60 60  Resp: 11 20  Temp:    SpO2: 95% 95%    Intake/Output Summary (Last 24 hours) at 03/26/2021 1449 Last data filed at 03/26/2021 0924 Gross per 24 hour  Intake 30 ml  Output --  Net 30 ml   Filed Weights   03/20/21 0332 03/26/21 1322  Weight: 105.2 kg 105.2 kg    ROS: Review of Systems  Respiratory: Negative for cough and shortness of breath.   Cardiovascular: Negative for chest pain.  Gastrointestinal: Negative for abdominal pain, nausea and vomiting.  Musculoskeletal: Positive for joint pain.   Exam: Physical Exam HENT:     Head: Normocephalic.     Mouth/Throat:     Pharynx: No oropharyngeal exudate.  Eyes:     General: Lids are normal.     Conjunctiva/sclera: Conjunctivae normal.     Pupils: Pupils are equal, round, and reactive to light.  Cardiovascular:     Rate and Rhythm: Normal rate and regular rhythm.     Heart sounds: Normal heart sounds, S1 normal and S2 normal.  Pulmonary:     Breath sounds: Normal breath sounds. No decreased breath sounds, wheezing, rhonchi or rales.  Abdominal:     Palpations: Abdomen is soft.     Tenderness: There is no abdominal tenderness.  Musculoskeletal:     Right ankle: No swelling.     Left ankle: No swelling.  Skin:    General: Skin is warm.     Findings: No rash.     Comments: Right foot wrapped.  Neurological:     Mental Status: He is alert and oriented to person, place, and time.       Data Reviewed: Basic Metabolic  Panel: Recent Labs  Lab 03/21/21 0427 03/22/21 0451 03/23/21 0446 03/24/21 0540 03/26/21 0426  NA 136 134* 136 138 138  K 4.0 4.2 4.6 4.6 4.2  CL 110 104 106 107 104  CO2 19* '23 23 24 24  '$ GLUCOSE 252* 262* 185* 107* 84  BUN 38* 41* 42* 53* 56*  CREATININE 1.45* 1.49* 1.47* 1.54* 1.34*  CALCIUM 8.5* 8.2* 8.4* 8.6* 8.5*  MG  --   --   --  2.2  --    Liver Function Tests: Recent Labs  Lab 03/20/21 0346  AST 37  ALT 38  ALKPHOS 95  BILITOT 0.6  PROT 7.0  ALBUMIN 3.8   CBC: Recent Labs  Lab 03/20/21 0346 03/21/21 0427 03/22/21 0451 03/23/21 0446 03/24/21 0540 03/26/21 0426  WBC 6.9 6.4 5.1 4.0 3.7* 3.5*  NEUTROABS 5.5  --   --   --   --   --   HGB 9.6* 9.0* 8.2* 8.0* 8.4* 8.6*  HCT 28.0* 27.0* 24.6* 24.4* 24.9* 25.9*  MCV 91.5 91.2 92.8 92.8 91.5 91.8  PLT 95* 84* 84* 96* 118* 146*   BNP (last 3 results) Recent Labs    03/20/21 0346  BNP 155.6*     CBG: Recent Labs  Lab 03/25/21 1101 03/25/21 1716  03/25/21 2145 03/26/21 0804 03/26/21 1138  GLUCAP 133* 78 316* 106* 97    Recent Results (from the past 240 hour(s))  Blood culture (routine x 2)     Status: Abnormal   Collection Time: 03/20/21  3:52 AM   Specimen: BLOOD LEFT FOREARM  Result Value Ref Range Status   Specimen Description   Final    BLOOD LEFT FOREARM Performed at [facility] [address] [place], [place] [address]    Special Requests   Final    BOTTLES DRAWN AEROBIC AND ANAEROBIC Blood Culture adequate volume Performed at [facility], [address] [place], [place] [address]    Culture  Setup Time   Final    AEROBIC BOTTLE ONLY GRAM POSITIVE COCCI Organism ID to follow CRITICAL RESULT CALLED TO, READ BACK BY AND VERIFIED WITH: ALEX CHAPPELL PHARMD [place] 03/20/21 HNM Performed at [facility], [address] [place],  [address]    Culture STAPHYLOCOCCUS AUREUS (A)  Final   Report Status 03/23/2021 FINAL  Final   Organism ID, Bacteria  STAPHYLOCOCCUS AUREUS  Final      Susceptibility   Staphylococcus aureus - MIC*    CIPROFLOXACIN <=0.5 SENSITIVE Sensitive     ERYTHROMYCIN RESISTANT Resistant     GENTAMICIN <=0.5 SENSITIVE Sensitive     OXACILLIN 0.5 SENSITIVE Sensitive     TETRACYCLINE <=1 SENSITIVE Sensitive     VANCOMYCIN <=0.5 SENSITIVE Sensitive     TRIMETH/SULFA <=10 SENSITIVE Sensitive     CLINDAMYCIN RESISTANT Resistant     RIFAMPIN <=0.5 SENSITIVE Sensitive     Inducible Clindamycin POSITIVE Resistant     * STAPHYLOCOCCUS AUREUS  Blood Culture ID Panel (Reflexed)     Status: Abnormal   Collection Time: 03/20/21  3:52 AM  Result Value Ref Range Status   Enterococcus faecalis NOT DETECTED NOT DETECTED Final   Enterococcus Faecium NOT DETECTED NOT DETECTED Final   Listeria monocytogenes NOT DETECTED NOT DETECTED Final   Staphylococcus species DETECTED (A) NOT DETECTED Final    Comment: CRITICAL RESULT CALLED TO, READ BACK BY AND VERIFIED WITH: ALEX CHAPPELL PHARMD [id number] 03/20/21 HNM    Staphylococcus aureus (BCID) DETECTED (A) NOT DETECTED Final    Comment: CRITICAL RESULT CALLED TO, READ BACK BY AND VERIFIED WITH: [place] [id number] 03/20/21 HNM    Staphylococcus epidermidis NOT DETECTED NOT DETECTED Final   Staphylococcus lugdunensis NOT DETECTED NOT DETECTED Final   Streptococcus species NOT DETECTED NOT DETECTED Final   Streptococcus agalactiae NOT DETECTED NOT DETECTED Final   Streptococcus pneumoniae NOT DETECTED NOT DETECTED Final   Streptococcus pyogenes NOT DETECTED NOT DETECTED Final   A.calcoaceticus-baumannii NOT DETECTED NOT DETECTED Final   Bacteroides fragilis NOT DETECTED NOT DETECTED Final   Enterobacterales NOT DETECTED NOT DETECTED Final   Enterobacter cloacae complex NOT DETECTED NOT DETECTED Final   Escherichia coli NOT DETECTED NOT DETECTED Final   Klebsiella aerogenes NOT DETECTED NOT DETECTED Final   Klebsiella oxytoca NOT DETECTED NOT DETECTED Final   Klebsiella pneumoniae  NOT DETECTED NOT DETECTED Final   Proteus species NOT DETECTED NOT DETECTED Final   Salmonella species NOT DETECTED NOT DETECTED Final   Serratia marcescens NOT DETECTED NOT DETECTED Final   Haemophilus influenzae NOT DETECTED NOT DETECTED Final   Neisseria meningitidis NOT DETECTED NOT DETECTED Final   Pseudomonas aeruginosa NOT DETECTED NOT DETECTED Final   Stenotrophomonas maltophilia NOT DETECTED NOT DETECTED Final   Candida albicans NOT DETECTED NOT DETECTED Final  Candida auris NOT DETECTED NOT DETECTED Final   Candida glabrata NOT DETECTED NOT DETECTED Final   Candida krusei NOT DETECTED NOT DETECTED Final   Candida parapsilosis NOT DETECTED NOT DETECTED Final   Candida tropicalis NOT DETECTED NOT DETECTED Final   Cryptococcus neoformans/gattii NOT DETECTED NOT DETECTED Final   Meth resistant mecA/C and MREJ NOT DETECTED NOT DETECTED Final    Comment: Performed at [facility], [address] [place], [place] [address]  Resp Panel by RT-PCR (Flu A&B, Covid) Nasopharyngeal Swab     Status: None   Collection Time: 03/20/21  4:01 AM   Specimen: Nasopharyngeal Swab; Nasopharyngeal(NP) swabs in vial transport medium  Result Value Ref Range Status   SARS Coronavirus 2 by RT PCR NEGATIVE NEGATIVE Final    Comment: (NOTE) SARS-CoV-2 target nucleic acids are NOT DETECTED.  The SARS-CoV-2 RNA is generally detectable in upper respiratory specimens during the acute phase of infection. The lowest concentration of SARS-CoV-2 viral copies this assay can detect is 138 copies/mL. A negative result does not preclude SARS-Cov-2 infection and should not be used as the sole basis for treatment or other patient management decisions. A negative result may occur with  improper specimen collection/handling, submission of specimen other than nasopharyngeal swab, presence of viral mutation(s) within the areas targeted by this assay, and inadequate number of viral copies(<138 copies/mL). A  negative result must be combined with clinical observations, patient history, and epidemiological information. The expected result is Negative.  Fact Sheet for Patients:  EntrepreneurPulse.com.au  Fact Sheet for Healthcare Providers:  IncredibleEmployment.be  This test is no t yet approved or cleared by the [place] FDA and  has been authorized for detection and/or diagnosis of SARS-CoV-2 by FDA under an Emergency Use Authorization (EUA). This EUA will remain  in effect (meaning this test can be used) for the duration of the COVID-19 declaration under Section 564(b)(1) of the Act, 21 U.S.C.section 360bbb-3(b)(1), unless the authorization is terminated  or revoked sooner.       Influenza A by PCR NEGATIVE NEGATIVE Final   Influenza B by PCR NEGATIVE NEGATIVE Final    Comment: (NOTE) The Xpert Xpress SARS-CoV-2/FLU/RSV plus assay is intended as an aid in the diagnosis of influenza from Nasopharyngeal swab specimens and should not be used as a sole basis for treatment. Nasal washings and aspirates are unacceptable for Xpert Xpress SARS-CoV-2/FLU/RSV testing.  Fact Sheet for Patients: EntrepreneurPulse.com.au  Fact Sheet for Healthcare Providers: IncredibleEmployment.be  This test is not yet approved or cleared by the [place] FDA and has been authorized for detection and/or diagnosis of SARS-CoV-2 by FDA under an Emergency Use Authorization (EUA). This EUA will remain in effect (meaning this test can be used) for the duration of the COVID-19 declaration under Section 564(b)(1) of the Act, 21 U.S.C. section 360bbb-3(b)(1), unless the authorization is terminated or revoked.  Performed at [facility], [address] [place], [place] [address]   Blood culture (routine x 2)     Status: None   Collection Time: 03/20/21 11:43 AM   Specimen: BLOOD  Result Value Ref Range Status    Specimen Description BLOOD WRIST  Final   Special Requests   Final    BOTTLES DRAWN AEROBIC AND ANAEROBIC Blood Culture results may not be optimal due to an excessive volume of blood received in culture bottles   Culture   Final    NO GROWTH 5 DAYS Performed at [facility], [address] [place], [place]  [id number]    Report Status 03/25/2021 FINAL  Final  Aerobic/Anaerobic Culture w Gram Stain (surgical/deep wound)     Status: None (Preliminary result)   Collection Time: 03/20/21  8:41 PM   Specimen: PATH Other; Tissue  Result Value Ref Range Status   Specimen Description   Final    TISSUE Performed at [facility], [address] [place], [place] [address]    Special Requests   Final    RIGHT GREAT TOE Performed at [facility], [place], [place] [address]    Gram Stain   Final    ABUNDANT WBC PRESENT,BOTH PMN AND MONONUCLEAR FEW GRAM POSITIVE COCCI Performed at [facility], [address], [place], [place] [address]    Culture   Final    ABUNDANT STAPHYLOCOCCUS AUREUS NO ANAEROBES ISOLATED; CULTURE IN PROGRESS FOR 5 DAYS    Report Status PENDING  Incomplete   Organism ID, Bacteria STAPHYLOCOCCUS AUREUS  Final      Susceptibility   Staphylococcus aureus - MIC*    CIPROFLOXACIN <=0.5 SENSITIVE Sensitive     ERYTHROMYCIN >=8 RESISTANT Resistant     GENTAMICIN <=0.5 SENSITIVE Sensitive     OXACILLIN 0.5 SENSITIVE Sensitive     TETRACYCLINE <=1 SENSITIVE Sensitive     VANCOMYCIN <=0.5 SENSITIVE Sensitive     TRIMETH/SULFA <=10 SENSITIVE Sensitive     CLINDAMYCIN RESISTANT Resistant     RIFAMPIN <=0.5 SENSITIVE Sensitive     Inducible Clindamycin POSITIVE Resistant     * ABUNDANT STAPHYLOCOCCUS AUREUS  Culture, blood (single) w Reflex to ID Panel     Status: None (Preliminary result)   Collection Time: 03/22/21  4:51 AM   Specimen: BLOOD  Result Value Ref Range Status   Specimen Description BLOOD  RIGHT ARM  Final    Special Requests   Final    BOTTLES DRAWN AEROBIC AND ANAEROBIC Blood Culture results may not be optimal due to an excessive volume of blood received in culture bottles   Culture   Final    NO GROWTH 4 DAYS Performed at [facility], [address] [place], [place] [address]    Report Status PENDING  Incomplete     Studies: No results found.  Scheduled Meds: . allopurinol  100 mg Oral Daily  . amLODipine  10 mg Oral Daily  . aspirin EC  81 mg Oral Daily  . baclofen  10 mg Oral BID  . carvedilol  6.25 mg Oral QHS  . Chlorhexidine Gluconate Cloth  6 each Topical Daily  . cholecalciferol  5,000 Units Oral Daily  . clopidogrel  75 mg Oral Daily  . docusate sodium  200 mg Oral BID  . fentaNYL      . furosemide  20 mg Oral Daily  . gabapentin  100 mg Oral Daily  . insulin aspart  0-5 Units Subcutaneous QHS  . insulin aspart  0-9 Units Subcutaneous TID WC  . insulin aspart  10 Units Subcutaneous TID WC  . insulin glargine  18 Units Subcutaneous QHS  . isosorbide mononitrate  30 mg Oral Daily  . lidocaine      . linaclotide  145 mcg Oral Daily  . loratadine  10 mg Oral Daily  . midazolam      . multivitamin with minerals  1 tablet Oral Daily  . pantoprazole  40 mg Oral Daily  . rosuvastatin  40 mg Oral Daily  . sertraline  200 mg Oral Daily  .  sodium chloride flush  10-40 mL Intracatheter Q12H  . sodium chloride flush      . tamsulosin  0.4 mg Oral Daily   Continuous Infusions: . sodium chloride 10 mL/hr at 03/25/21 0623  . [START ON 03/27/2021] sodium chloride    .  ceFAZolin (ANCEF) IV 2 g (03/26/21 [id number])    Assessment/Plan:  1. 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  Dr. 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  Patient currently on cefazolin.  TEE negative for endocarditis.  Patient has a PICC line in and will likely need aggressive antibiotics secondary to having bioprosthetic heart valve and pacemaker.  Wound culture  also growing staph aureus.  Repeat blood cultures so far negative.  Infectious disease following. 2. Peripheral vascular disease for repeat angiogram tomorrow. 3. Dizziness.  Improved with IV fluid hydration 4. History of CAD on aspirin, Plavix, Crestor and Imdur. 5. Status post TAVR with bioprosthetic aortic valve 6. 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 Chronic thrombocytopenia.  Platelet count starting to come up today at 146. 8. 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  Patient on Lantus insulin.  Hydration prior to angiogram tomorrow. 9. Anemia of chronic disease continue to monitor 10. Depression on Zoloft 11. BPH on Flomax        Code Status:     Code Status Orders  (From admission, onward)         Start     Ordered   03/20/21 0731  Full code  Continuous        03/20/21 0731        Code Status History    Date Active Date Inactive Code Status Order ID Comments User Context   02/15/2021 1641 02/22/2021 2133 Full Code [id number]  Barb Merino, MD Inpatient   02/13/2021 1759 02/15/2021 1640 DNR [id number]  Rhetta Mura, DO Inpatient   11/21/2020 2137 11/22/2020 2249 DNR [id number]  Lenore Cordia, MD ED   Advance Care Planning Activity    Advance Directive Documentation   Flowsheet Row Most Recent Value  Type of Advance Directive Living will  Pre-existing out of facility DNR order (yellow form or pink MOST form) --  "MOST" Form in Place? --     Family Communication: Spoke with fianc at the bedside Disposition Plan: Status is: Inpatient  Dispo: The patient is from: Home              Anticipated d/c is to: Home              Patient currently to have a repeat angiogram tomorrow.    Difficult to place patient.  No.  Consultants:  Podiatry  Cardiology  Infectious disease  Vascular surgery  Procedures:  Right great toe amputation  Antibiotics:  Ancef  Time spent: 28 minutes, case discussed with  cardiology  Loletha Grayer  [facility]

## 2021-03-26 NOTE — Procedures (Signed)
Transesophageal Echocardiogram :  Indication: bacteremia  Procedure: 10cc of viscous lidocaine were given orally to provide local anesthesia to the oropharynx. The patient was positioned supine on the left side, bite block provided. The patient was moderately sedated with the doses of versed and fentanyl as detailed below.  Using digital technique an omniplane probe was advanced into the esophagus without incident.   Moderate sedation: 1. Sedation used:  Versed: '2mg'$ , Fentanyl: '50mg'$  2. Time administered:   13:56  Time when patient started recovery: 14:12 3. I was face to face during this time :2mns  See report in EPIC  for complete details: In brief, imaging revealed normal LV function with no RWMAs and no mural apical thrombus.  .  Estimated ejection fraction was 55%.  Right sided cardiac chambers were normal   Pacemaker wires evaluated with no evidence for vegetation TAVR valve noted with no evidence for vegetation  Imaging of the septum showed no ASD or VSD 2D and color flow confirmed no PFO  The LA was well visualized in orthogonal views.  There was no spontaneous contrast and no thrombus in the LA and LA appendage   The descending thoracic aorta had no  mural aortic debris with no evidence of aneurysmal dilation or dissection  There was no evidence for endocarditis on TEE   BKate Sable4/13/2022 2:32 PM

## 2021-03-26 NOTE — Progress Notes (Signed)
[place] INFECTIOUS DISEASE PROGRESS NOTE Date of Admission:  03/20/2021     ID: Nathan Singleton is a 80 y.o. male with MSSA bacteremia and foot infection Principal Problem:   Cellulitis of great toe, right Active Problems:   Hypertension   PVD (peripheral vascular disease) (HCC)   CAD (coronary artery disease)   Thrombocytopenia (HCC)   Normocytic anemia   COPD (chronic obstructive pulmonary disease) (HCC)   Depression   GERD without esophagitis   Gout   Hyperlipidemia   Type II diabetes mellitus with renal manifestations (HCC)   CKD (chronic kidney disease), stage IIIa   Dizziness   Bacteremia   Subjective: No fevers, remains on ancef. Still some foot pain - TEE neg today  ROS  Eleven systems are reviewed and negative except per hpi  Medications:  Antibiotics Given (last 72 hours)    Date/Time Action Medication Dose Rate   03/23/21 1346 New Bag/Given   [MAR Hold] ceFAZolin (ANCEF) IVPB 2g/100 mL premix (MAR Hold since Wed 03/26/2021 at 1319.Hold Reason: Transfer to a Procedural area.) 2 g 200 mL/hr   03/23/21 2140 New Bag/Given   [MAR Hold] ceFAZolin (ANCEF) IVPB 2g/100 mL premix (MAR Hold since Wed 03/26/2021 at 1319.Hold Reason: Transfer to a Procedural area.) 2 g 200 mL/hr   03/24/21 0557 New Bag/Given   [MAR Hold] ceFAZolin (ANCEF) IVPB 2g/100 mL premix (MAR Hold since Wed 03/26/2021 at 1319.Hold Reason: Transfer to a Procedural area.) 2 g 200 mL/hr   03/24/21 1303 New Bag/Given   [MAR Hold] ceFAZolin (ANCEF) IVPB 2g/100 mL premix (MAR Hold since Wed 03/26/2021 at 1319.Hold Reason: Transfer to a Procedural area.) 2 g 200 mL/hr   03/25/21 0625 New Bag/Given   [MAR Hold] ceFAZolin (ANCEF) IVPB 2g/100 mL premix (MAR Hold since Wed 03/26/2021 at 1319.Hold Reason: Transfer to a Procedural area.) 2 g 200 mL/hr   03/25/21 1259 New Bag/Given   [MAR Hold] ceFAZolin (ANCEF) IVPB 2g/100 mL premix (MAR Hold since Wed 03/26/2021 at 1319.Hold Reason: Transfer to a Procedural area.) 2  g 200 mL/hr   03/25/21 2105 New Bag/Given   [MAR Hold] ceFAZolin (ANCEF) IVPB 2g/100 mL premix (MAR Hold since Wed 03/26/2021 at 1319.Hold Reason: Transfer to a Procedural area.) 2 g 200 mL/hr   03/26/21 0625 New Bag/Given   [MAR Hold] ceFAZolin (ANCEF) IVPB 2g/100 mL premix (MAR Hold since Wed 03/26/2021 at 1319.Hold Reason: Transfer to a Procedural area.) 2 g 200 mL/hr     . [MAR Hold] allopurinol  100 mg Oral Daily  . [MAR Hold] amLODipine  10 mg Oral Daily  . [MAR Hold] aspirin EC  81 mg Oral Daily  . [MAR Hold] baclofen  10 mg Oral BID  . [MAR Hold] carvedilol  6.25 mg Oral QHS  . [MAR Hold] Chlorhexidine Gluconate Cloth  6 each Topical Daily  . [MAR Hold] cholecalciferol  5,000 Units Oral Daily  . [MAR Hold] clopidogrel  75 mg Oral Daily  . [MAR Hold] docusate sodium  200 mg Oral BID  . [MAR Hold] furosemide  20 mg Oral Daily  . [MAR Hold] gabapentin  100 mg Oral Daily  . [MAR Hold] insulin aspart  0-5 Units Subcutaneous QHS  . [MAR Hold] insulin aspart  0-9 Units Subcutaneous TID WC  . [MAR Hold] insulin aspart  10 Units Subcutaneous TID WC  . [MAR Hold] insulin glargine  18 Units Subcutaneous QHS  . [MAR Hold] isosorbide mononitrate  30 mg Oral Daily  . [MAR Hold] linaclotide  145  mcg Oral Daily  . [MAR Hold] loratadine  10 mg Oral Daily  . [MAR Hold] multivitamin with minerals  1 tablet Oral Daily  . [MAR Hold] pantoprazole  40 mg Oral Daily  . [MAR Hold] rosuvastatin  40 mg Oral Daily  . [MAR Hold] sertraline  200 mg Oral Daily  . [MAR Hold] sodium chloride flush  10-40 mL Intracatheter Q12H  . [MAR Hold] tamsulosin  0.4 mg Oral Daily    Objective: Vital signs in last 24 hours: Temp:  [97.5 F (36.4 C)-98.4 F (36.9 C)] 98.3 F (36.8 C) (04/13 1322) Pulse Rate:  [61-72] 72 (04/13 1322) Resp:  [15-19] 17 (04/13 1322) BP: (129-156)/(59-82) 130/68 (04/13 1322) SpO2:  [96 %-100 %] 96 % (04/13 1322) Weight:  [105.2 kg] 105.2 kg (04/13 1322) Constitutional:  oriented to  person, place, and time. appears well-developed and well-nourished. No distress. HOH HENT: [place]/AT, PERRLA, no scleral icterus Mouth/Throat: Oropharynx is clear and moist. No oropharyngeal exudate.  Cardiovascular: Normal rate, regular rhythm and normal heart sounds. Exam reveals no gallop and no friction rub.  No murmur heard.  Pulmonary/Chest: Effort normal and breath sounds normal. No respiratory distress.  has no wheezes.  Neck = supple, no nuchal rigidity Abdominal: Soft. Bowel sounds are normal.  exhibits no distension. There is no tenderness.  Lymphadenopathy: no cervical adenopathy. No axillary adenopathy Neurological: alert and oriented to person, place, and time.  Skin:foot wrapped post op Psychiatric: a normal mood and affect.  behavior is normal.   Lab Results Recent Labs    03/24/21 0540 03/26/21 0426  WBC 3.7* 3.5*  HGB 8.4* 8.6*  HCT 24.9* 25.9*  NA 138 138  K 4.6 4.2  CL 107 104  CO2 24 24  BUN 53* 56*  CREATININE 1.54* 1.34*    Microbiology: Results for orders placed or performed during the hospital encounter of 03/20/21  Blood culture (routine x 2)     Status: Abnormal   Collection Time: 03/20/21  3:52 AM   Specimen: BLOOD LEFT FOREARM  Result Value Ref Range Status   Specimen Description   Final    BLOOD LEFT FOREARM Performed at [facility], [place] [address] [place], [place] [address]    Special Requests   Final    BOTTLES DRAWN AEROBIC AND ANAEROBIC Blood Culture adequate volume Performed at [facility], [address] [place], [place] [address]    Culture  Setup Time   Final    AEROBIC BOTTLE ONLY GRAM POSITIVE COCCI Organism ID to follow CRITICAL RESULT CALLED TO, READ BACK BY AND VERIFIED WITH: ALEX CHAPPELL PHARMD 2225 03/20/21 HNM Performed at [facility], [address] [place],  [address]    Culture STAPHYLOCOCCUS AUREUS (A)  Final   Report Status 03/23/2021 FINAL  Final   Organism ID, Bacteria  STAPHYLOCOCCUS AUREUS  Final      Susceptibility   Staphylococcus aureus - MIC*    CIPROFLOXACIN <=0.5 SENSITIVE Sensitive     ERYTHROMYCIN RESISTANT Resistant     GENTAMICIN <=0.5 SENSITIVE Sensitive     OXACILLIN 0.5 SENSITIVE Sensitive     TETRACYCLINE <=1 SENSITIVE Sensitive     VANCOMYCIN <=0.5 SENSITIVE Sensitive     TRIMETH/SULFA <=10 SENSITIVE Sensitive     CLINDAMYCIN RESISTANT Resistant     RIFAMPIN <=0.5 SENSITIVE Sensitive     Inducible Clindamycin POSITIVE Resistant     * STAPHYLOCOCCUS AUREUS  Blood Culture ID Panel (Reflexed)     Status: Abnormal  Collection Time: 03/20/21  3:52 AM  Result Value Ref Range Status   Enterococcus faecalis NOT DETECTED NOT DETECTED Final   Enterococcus Faecium NOT DETECTED NOT DETECTED Final   Listeria monocytogenes NOT DETECTED NOT DETECTED Final   Staphylococcus species DETECTED (A) NOT DETECTED Final    Comment: CRITICAL RESULT CALLED TO, READ BACK BY AND VERIFIED WITH: ALEX CHAPPELL PHARMD 2225 03/20/21 HNM    Staphylococcus aureus (BCID) DETECTED (A) NOT DETECTED Final    Comment: CRITICAL RESULT CALLED TO, READ BACK BY AND VERIFIED WITH: ALEX CHAPPELL PHARMD 2225 03/20/21 HNM    Staphylococcus epidermidis NOT DETECTED NOT DETECTED Final   Staphylococcus lugdunensis NOT DETECTED NOT DETECTED Final   Streptococcus species NOT DETECTED NOT DETECTED Final   Streptococcus agalactiae NOT DETECTED NOT DETECTED Final   Streptococcus pneumoniae NOT DETECTED NOT DETECTED Final   Streptococcus pyogenes NOT DETECTED NOT DETECTED Final   A.calcoaceticus-baumannii NOT DETECTED NOT DETECTED Final   Bacteroides fragilis NOT DETECTED NOT DETECTED Final   Enterobacterales NOT DETECTED NOT DETECTED Final   Enterobacter cloacae complex NOT DETECTED NOT DETECTED Final   Escherichia coli NOT DETECTED NOT DETECTED Final   Klebsiella aerogenes NOT DETECTED NOT DETECTED Final   Klebsiella oxytoca NOT DETECTED NOT DETECTED Final   Klebsiella pneumoniae  NOT DETECTED NOT DETECTED Final   Proteus species NOT DETECTED NOT DETECTED Final   Salmonella species NOT DETECTED NOT DETECTED Final   Serratia marcescens NOT DETECTED NOT DETECTED Final   Haemophilus influenzae NOT DETECTED NOT DETECTED Final   Neisseria meningitidis NOT DETECTED NOT DETECTED Final   Pseudomonas aeruginosa NOT DETECTED NOT DETECTED Final   Stenotrophomonas maltophilia NOT DETECTED NOT DETECTED Final   Candida albicans NOT DETECTED NOT DETECTED Final   Candida auris NOT DETECTED NOT DETECTED Final   Candida glabrata NOT DETECTED NOT DETECTED Final   Candida krusei NOT DETECTED NOT DETECTED Final   Candida parapsilosis NOT DETECTED NOT DETECTED Final   Candida tropicalis NOT DETECTED NOT DETECTED Final   Cryptococcus neoformans/gattii NOT DETECTED NOT DETECTED Final   Meth resistant mecA/C and MREJ NOT DETECTED NOT DETECTED Final    Comment: Performed at [facility], [address] [place], [place] [address]  Resp Panel by RT-PCR (Flu A&B, Covid) Nasopharyngeal Swab     Status: None   Collection Time: 03/20/21  4:01 AM   Specimen: Nasopharyngeal Swab; Nasopharyngeal(NP) swabs in vial transport medium  Result Value Ref Range Status   SARS Coronavirus 2 by RT PCR NEGATIVE NEGATIVE Final    Comment: (NOTE) SARS-CoV-2 target nucleic acids are NOT DETECTED.  The SARS-CoV-2 RNA is generally detectable in upper respiratory specimens during the acute phase of infection. The lowest concentration of SARS-CoV-2 viral copies this assay can detect is 138 copies/mL. A negative result does not preclude SARS-Cov-2 infection and should not be used as the sole basis for treatment or other patient management decisions. A negative result may occur with  improper specimen collection/handling, submission of specimen other than nasopharyngeal swab, presence of viral mutation(s) within the areas targeted by this assay, and inadequate number of viral copies(<138 copies/mL). A  negative result must be combined with clinical observations, patient history, and epidemiological information. The expected result is Negative.  Fact Sheet for Patients:  EntrepreneurPulse.com.au  Fact Sheet for Healthcare Providers:  IncredibleEmployment.be  This test is no t yet approved or cleared by the [place] FDA and  has been authorized for detection and/or diagnosis of SARS-CoV-2 by FDA under an Emergency  Use Authorization (EUA). This EUA will remain  in effect (meaning this test can be used) for the duration of the COVID-19 declaration under Section 564(b)(1) of the Act, 21 U.S.C.section 360bbb-3(b)(1), unless the authorization is terminated  or revoked sooner.       Influenza A by PCR NEGATIVE NEGATIVE Final   Influenza B by PCR NEGATIVE NEGATIVE Final    Comment: (NOTE) The Xpert Xpress SARS-CoV-2/FLU/RSV plus assay is intended as an aid in the diagnosis of influenza from Nasopharyngeal swab specimens and should not be used as a sole basis for treatment. Nasal washings and aspirates are unacceptable for Xpert Xpress SARS-CoV-2/FLU/RSV testing.  Fact Sheet for Patients: EntrepreneurPulse.com.au  Fact Sheet for Healthcare Providers: IncredibleEmployment.be  This test is not yet approved or cleared by the [place] FDA and has been authorized for detection and/or diagnosis of SARS-CoV-2 by FDA under an Emergency Use Authorization (EUA). This EUA will remain in effect (meaning this test can be used) for the duration of the COVID-19 declaration under Section 564(b)(1) of the Act, 21 U.S.C. section 360bbb-3(b)(1), unless the authorization is terminated or revoked.  Performed at [facility], [address] [place], [place] [address]   Blood culture (routine x 2)     Status: None   Collection Time: 03/20/21 11:43 AM   Specimen: BLOOD  Result Value Ref Range Status    Specimen Description BLOOD WRIST  Final   Special Requests   Final    BOTTLES DRAWN AEROBIC AND ANAEROBIC Blood Culture results may not be optimal due to an excessive volume of blood received in culture bottles   Culture   Final    NO GROWTH 5 DAYS Performed at [facility], [address], [place], [place] [address]    Report Status 03/25/2021 FINAL  Final  Aerobic/Anaerobic Culture w Gram Stain (surgical/deep wound)     Status: None (Preliminary result)   Collection Time: 03/20/21  8:41 PM   Specimen: PATH Other; Tissue  Result Value Ref Range Status   Specimen Description   Final    TISSUE Performed at [facility], [address] [place], [place] [address]    Special Requests   Final    RIGHT GREAT TOE Performed at [facility], [address], [place], [place] [address]    Gram Stain   Final    ABUNDANT WBC PRESENT,BOTH PMN AND MONONUCLEAR FEW GRAM POSITIVE COCCI Performed at [facility], [place] [address], [place],  [address]    Culture   Final    ABUNDANT STAPHYLOCOCCUS AUREUS NO ANAEROBES ISOLATED; CULTURE IN PROGRESS FOR 5 DAYS    Report Status PENDING  Incomplete   Organism ID, Bacteria STAPHYLOCOCCUS AUREUS  Final      Susceptibility   Staphylococcus aureus - MIC*    CIPROFLOXACIN <=0.5 SENSITIVE Sensitive     ERYTHROMYCIN >=8 RESISTANT Resistant     GENTAMICIN <=0.5 SENSITIVE Sensitive     OXACILLIN 0.5 SENSITIVE Sensitive     TETRACYCLINE <=1 SENSITIVE Sensitive     VANCOMYCIN <=0.5 SENSITIVE Sensitive     TRIMETH/SULFA <=10 SENSITIVE Sensitive     CLINDAMYCIN RESISTANT Resistant     RIFAMPIN <=0.5 SENSITIVE Sensitive     Inducible Clindamycin POSITIVE Resistant     * ABUNDANT STAPHYLOCOCCUS AUREUS  Culture, blood (single) w Reflex to ID Panel     Status: None (Preliminary result)   Collection Time: 03/22/21  4:51 AM   Specimen: BLOOD  Result Value Ref Range  Status   Specimen Description BLOOD  RIGHT ARM  Final    Special Requests   Final    BOTTLES DRAWN AEROBIC AND ANAEROBIC Blood Culture results may not be optimal due to an excessive volume of blood received in culture bottles   Culture   Final    NO GROWTH 4 DAYS Performed at [facility], [address] [place], [place] [address]    Report Status PENDING  Incomplete    Studies/Results: No results found.  Assessment/Plan: Nathan Singleton is a 80 y.o. 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 Now readmitted with dizziness, hypotension and found to have MSSA bacteremia. Now s/p repeat surgery with toe amputation.  He is relatively stable and no signs of metatatic infection however he has a bioprosthetic valve, PPM, bil TKR and R shoulder replacment  April 11th -he has been afebrile over the last few days and his white count 3.7.  He remains just on cefazolin.  Follow-up blood cultures done April 7 and 9 remain negative.  CRP was noted to be 11 sed rate 70 on April 7.  Culture from his surgery on the seventh grew methicillin sensitive staph aureus as well.  He has been seen by surgery and there is some concern he may need further surgical debridement given peripheral vascular disease.  He has had his echocardiogram but is not read yet.  4/13- Neg TEE   Recommendations FU BCX neg For repeat LE angio Neg TTE.  TEE neg (has bioprosthetic valve and Pacemaker so high risk of endocarditis). Will need aggressive treatment given the prosthetic joints and the PPM wires and prosthetic valve Cont  cefazolin - planned 4 week course  Thank you very much for the consult. Will follow with you.  Leonel Ramsay   03/26/2021, 1:31 PM

## 2021-03-26 NOTE — Progress Notes (Signed)
*  PRELIMINARY RESULTS* Echocardiogram Echocardiogram Transesophageal has been performed.  Sherrie Sport 03/26/2021, 2:24 PM

## 2021-03-26 NOTE — Progress Notes (Signed)
Progress Note  Patient Name: Nathan Singleton Date of Encounter: 03/26/2021  Primary Cardiologist: Agustin Cree - formerly [facility]   Subjective   No acute overnight events. No chest pain or dyspnea. He is for TEE today.   Inpatient Medications    Scheduled Meds: . allopurinol  100 mg Oral Daily  . amLODipine  10 mg Oral Daily  . aspirin EC  81 mg Oral Daily  . baclofen  10 mg Oral BID  . carvedilol  6.25 mg Oral QHS  . Chlorhexidine Gluconate Cloth  6 each Topical Daily  . cholecalciferol  5,000 Units Oral Daily  . clopidogrel  75 mg Oral Daily  . docusate sodium  200 mg Oral BID  . furosemide  20 mg Oral Daily  . gabapentin  100 mg Oral Daily  . insulin aspart  0-5 Units Subcutaneous QHS  . insulin aspart  0-9 Units Subcutaneous TID WC  . insulin aspart  10 Units Subcutaneous TID WC  . insulin glargine  18 Units Subcutaneous QHS  . isosorbide mononitrate  30 mg Oral Daily  . linaclotide  145 mcg Oral Daily  . loratadine  10 mg Oral Daily  . multivitamin with minerals  1 tablet Oral Daily  . pantoprazole  40 mg Oral Daily  . rosuvastatin  40 mg Oral Daily  . sertraline  200 mg Oral Daily  . sodium chloride flush  10-40 mL Intracatheter Q12H  . tamsulosin  0.4 mg Oral Daily   Continuous Infusions: . sodium chloride 10 mL/hr at 03/25/21 0623  .  ceFAZolin (ANCEF) IV 2 g (03/26/21 0625)   PRN Meds: sodium chloride, acetaminophen, albuterol, dextromethorphan-guaiFENesin, diphenoxylate-atropine, hydrALAZINE, HYDROcodone-acetaminophen, ondansetron (ZOFRAN) IV, senna-docusate, sodium chloride flush   Vital Signs    Vitals:   03/25/21 0842 03/25/21 1751 03/25/21 2056 03/26/21 0534  BP: (!) 140/44 (!) 135/59 (!) 156/72 136/75  Pulse: 75 67 70 66  Resp: '16 15 18 18  '$ Temp: 99.7 F (37.6 C) (!) 97.5 F (36.4 C) 98.2 F (36.8 C) 98.4 F (36.9 C)  TempSrc:   Oral   SpO2: 99% 99% 100% 100%  Weight:      Height:        Intake/Output Summary (Last 24 hours) at  03/26/2021 0738 Last data filed at 03/25/2021 1401 Gross per 24 hour  Intake 360 ml  Output --  Net 360 ml   Filed Weights   03/20/21 0332  Weight: 105.2 kg    Telemetry    Not on tele - Personally Reviewed  ECG    No new tracings - Personally Reviewed  Physical Exam   GEN: No acute distress.   Neck: No JVD. Cardiac: RRR, II/VI systolic murmur RUSB, no rubs, or gallops.  Respiratory: Clear to auscultation bilaterally.  GI: Soft, nontender, non-distended.   MS: Right foot is wrapped; No deformity. Neuro:  Alert and oriented x 3; Nonfocal.  Psych: Normal affect.  Labs    Chemistry Recent Labs  Lab 03/20/21 0346 03/21/21 0427 03/23/21 0446 03/24/21 0540 03/26/21 0426  NA 136   < > 136 138 138  K 4.4   < > 4.6 4.6 4.2  CL 110   < > 106 107 104  CO2 18*   < > '23 24 24  '$ GLUCOSE 172*   < > 185* 107* 84  BUN 48*   < > 42* 53* 56*  CREATININE 1.45*   < > 1.47* 1.54* 1.34*  CALCIUM 8.7*   < >  8.4* 8.6* 8.5*  PROT 7.0  --   --   --   --   ALBUMIN 3.8  --   --   --   --   AST 37  --   --   --   --   ALT 38  --   --   --   --   ALKPHOS 95  --   --   --   --   BILITOT 0.6  --   --   --   --   GFRNONAA 49*   < > 49* 46* 54*  ANIONGAP 8   < > '7 7 10   '$ < > = values in this interval not displayed.     Hematology Recent Labs  Lab 03/23/21 0446 03/24/21 0540 03/26/21 0426  WBC 4.0 3.7* 3.5*  RBC 2.63* 2.72* 2.82*  HGB 8.0* 8.4* 8.6*  HCT 24.4* 24.9* 25.9*  MCV 92.8 91.5 91.8  MCH 30.4 30.9 30.5  MCHC 32.8 33.7 33.2  RDW 13.7 13.7 13.6  PLT 96* 118* 146*    Cardiac EnzymesNo results for input(s): TROPONINI in the last 168 hours. No results for input(s): TROPIPOC in the last 168 hours.   BNP Recent Labs  Lab 03/20/21 0346  BNP 155.6*     DDimer No results for input(s): DDIMER in the last 168 hours.   Radiology     Cardiac Studies   2D echo 03/24/2021: 1. Left ventricular ejection fraction, by estimation, is >55%. The left  ventricle has normal  function. Left ventricular endocardial border not  optimally defined to evaluate regional wall motion. Left ventricular  diastolic parameters are indeterminate.  2. Right ventricular systolic function is normal. The right ventricular  size is normal.  3. Left atrial size was mildly dilated.  4. The mitral valve was not well visualized. Mild mitral valve  regurgitation. No evidence of mitral stenosis.  5. The aortic valve was not well visualized. Aortic valve regurgitation  is not visualized. Mild aortic valve stenosis. Aortic valve mean gradient  measures 16.0 mmHg.  6. The inferior vena cava is dilated in size with <50% respiratory  variability, suggesting right atrial pressure of 15 mmHg.   Conclusion(s)/Recommendation(s): No definite evidence of endocarditis.  However, acoustic windows are suboptimal and transesophageal  echocardiogram should be considered in the setting of implanted cardiac  devices and MSSA bacteremia.  __________  Nuclear stress test 06/2020: Findings: Tid measures 1.0. Ejection fraction measures 63%. No perfusion defect is identified. __________  LHC 11/2019: 1. Mild nonobstructive coronary disease.  2. Previously placed stent to the proximal RCA has mild in-stent  stenosis.  3. No complications, estimated blood loss minimal.  Patient Profile     80 y.o. 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 Arbutus Ped.  Assessment & Plan    1. Right toe  cellulitis with gangrenous changes with concomitant MSSA bacteremia: -Repeat blood cultures negative to date -Planning for TEE today -NPO -After careful review of history and examination, the  risks and benefits of transesophageal echocardiogram have been explained including risks of esophageal damage, perforation (1:10,000 risk), bleeding, pharyngeal hematoma as well as other potential complications associated with conscious sedation including aspiration, arrhythmia, respiratory failure and death. Alternatives to treatment were discussed, questions were answered. Patient is willing to proceed  2.  CAD involving the native coronary arteries without angina: -No symptoms concerning for angina -No plans for inpatient ischemic evaluation at this time -Continue current medical therapy which includes aspirin, clopidogrel, amlodipine, carvedilol, Imdur, and rosuvastatin  3.  Sick sinus syndrome status post dual-chamber PPM: -Device appears to be functioning normally -May need to get EP involved given MSSA bacteremia pending TEE results -Recommend he establish with EP in the outpatient setting  4.  Aortic valve stenosis status post TAVR: -Status post TAVR in 04/2018 -TEE as above -ASA -SBE prophylaxis in the outpatient setting   5.  PAD: -Remains on aspirin and Plavix per vascular surgery -Vascular surgery planning for repeat lower extremity angiogram on 4/14  6.  History of gastrojejunal ulcers with underlying anemia: -Hgb low though relatively stable  7.  Thrombocytopenia: -PLT count slightly improved  8.  CKD stage III: -Renal function stable  For questions or updates, please contact [place] Please consult [email or web address] for contact info under Cardiology/STEMI.    Signed, Christell Faith, PA-C [place] Pager: [phone] 03/26/2021, 7:38 AM

## 2021-03-27 ENCOUNTER — Encounter
Admission: EM | Disposition: A | Discharge status: Home / Self Care | Payer: Self-pay | Source: Home / Self Care | Attending: Internal Medicine

## 2021-03-27 ENCOUNTER — Other Ambulatory Visit: Payer: Self-pay

## 2021-03-27 ENCOUNTER — Encounter: Payer: Self-pay | Admitting: Cardiology

## 2021-03-27 DIAGNOSIS — I739 Peripheral vascular disease, unspecified: Secondary | ICD-10-CM | POA: Diagnosis not present

## 2021-03-27 DIAGNOSIS — R42 Dizziness and giddiness: Secondary | ICD-10-CM | POA: Diagnosis not present

## 2021-03-27 DIAGNOSIS — L03031 Cellulitis of right toe: Secondary | ICD-10-CM | POA: Diagnosis not present

## 2021-03-27 DIAGNOSIS — I70261 Atherosclerosis of native arteries of extremities with gangrene, right leg: Secondary | ICD-10-CM

## 2021-03-27 DIAGNOSIS — I25118 Atherosclerotic heart disease of native coronary artery with other forms of angina pectoris: Secondary | ICD-10-CM

## 2021-03-27 DIAGNOSIS — R7881 Bacteremia: Secondary | ICD-10-CM | POA: Diagnosis not present

## 2021-03-27 DIAGNOSIS — N183 Chronic kidney disease, stage 3 unspecified: Secondary | ICD-10-CM

## 2021-03-27 HISTORY — PX: LOWER EXTREMITY ANGIOGRAPHY: CATH118251

## 2021-03-27 LAB — BASIC METABOLIC PANEL
Anion gap: 10 (ref 5–15)
BUN: 60 mg/dL — ABNORMAL HIGH (ref 8–23)
CO2: 24 mmol/L (ref 22–32)
Calcium: 8.7 mg/dL — ABNORMAL LOW (ref 8.9–10.3)
Chloride: 103 mmol/L (ref 98–111)
Creatinine, Ser: 1.37 mg/dL — ABNORMAL HIGH (ref 0.61–1.24)
GFR, Estimated: 53 mL/min — ABNORMAL LOW (ref >60)
Glucose, Bld: 202 mg/dL — ABNORMAL HIGH (ref 70–99)
Potassium: 5 mmol/L (ref 3.5–5.1)
Sodium: 137 mmol/L (ref 135–145)

## 2021-03-27 LAB — GLUCOSE, CAPILLARY
Glucose-Capillary: 197 mg/dL — ABNORMAL HIGH (ref 70–99)
Glucose-Capillary: 189 mg/dL — ABNORMAL HIGH (ref 70–99)
Glucose-Capillary: 206 mg/dL — ABNORMAL HIGH (ref 70–99)
Glucose-Capillary: 207 mg/dL — ABNORMAL HIGH (ref 70–99)

## 2021-03-27 LAB — CULTURE, BLOOD (SINGLE): Culture: NO GROWTH

## 2021-03-27 SURGERY — LOWER EXTREMITY ANGIOGRAPHY
Anesthesia: Moderate Sedation | Laterality: Right

## 2021-03-27 MED ORDER — FENTANYL CITRATE (PF) 100 MCG/2ML IJ SOLN
INTRAMUSCULAR | Status: DC | PRN
  Administered 2021-03-27: 50 ug via INTRAVENOUS

## 2021-03-27 MED ORDER — METHYLPREDNISOLONE SODIUM SUCC 125 MG IJ SOLR: 125.0000 mg | Freq: Once | INTRAMUSCULAR | Status: DC | PRN

## 2021-03-27 MED ORDER — HYDROMORPHONE HCL 1 MG/ML IJ SOLN: 1.0000 mg | Freq: Once | INTRAMUSCULAR | Status: DC | PRN

## 2021-03-27 MED ORDER — FENTANYL CITRATE (PF) 100 MCG/2ML IJ SOLN
INTRAMUSCULAR | Status: AC
  Filled 2021-03-27: qty 2

## 2021-03-27 MED ORDER — HEPARIN SODIUM (PORCINE) 1000 UNIT/ML IJ SOLN
INTRAMUSCULAR | Status: AC
  Filled 2021-03-27: qty 1

## 2021-03-27 MED ORDER — FAMOTIDINE 20 MG PO TABS: 40.0000 mg | ORAL_TABLET | Freq: Once | ORAL | Status: DC | PRN

## 2021-03-27 MED ORDER — CEFAZOLIN SODIUM-DEXTROSE 2-4 GM/100ML-% IV SOLN: 2.0000 g | Freq: Once | INTRAVENOUS | Status: DC

## 2021-03-27 MED ORDER — MIDAZOLAM HCL 5 MG/5ML IJ SOLN
INTRAMUSCULAR | Status: AC
  Filled 2021-03-27: qty 5

## 2021-03-27 MED ORDER — MIDAZOLAM HCL 2 MG/2ML IJ SOLN
INTRAMUSCULAR | Status: DC | PRN
Start: 2021-03-27 — End: 2021-03-27
  Administered 2021-03-27: 2 mg via INTRAVENOUS

## 2021-03-27 MED ORDER — ONDANSETRON HCL 4 MG/2ML IJ SOLN: 4.0000 mg | Freq: Four times a day (QID) | INTRAMUSCULAR | Status: DC | PRN

## 2021-03-27 MED ORDER — DIPHENHYDRAMINE HCL 50 MG/ML IJ SOLN: 50.0000 mg | Freq: Once | INTRAMUSCULAR | Status: DC | PRN

## 2021-03-27 MED ORDER — SODIUM ZIRCONIUM CYCLOSILICATE 10 G PO PACK
10.0000 g | PACK | Freq: Once | ORAL | Status: AC
  Administered 2021-03-27: 10 g via ORAL
  Filled 2021-03-27: qty 1

## 2021-03-27 MED ORDER — MIDAZOLAM HCL 2 MG/ML PO SYRP
8.0000 mg | ORAL_SOLUTION | Freq: Once | ORAL | Status: DC | PRN
  Filled 2021-03-27: qty 4

## 2021-03-27 MED ORDER — SODIUM CHLORIDE 0.9 % IV SOLN: INTRAVENOUS | Status: DC

## 2021-03-27 SURGICAL SUPPLY — 9 items
CATH ANGIO 5F PIGTAIL 65CM (CATHETERS) ×2 IMPLANT
CATH TEMPO 5F RIM 65CM (CATHETERS) ×2 IMPLANT
COVER PROBE U/S 5X48 (MISCELLANEOUS) ×2 IMPLANT
DEVICE STARCLOSE SE CLOSURE (Vascular Products) ×2 IMPLANT
PACK ANGIOGRAPHY (CUSTOM PROCEDURE TRAY) ×2 IMPLANT
SHEATH BRITE TIP 5FRX11 (SHEATH) ×2 IMPLANT
SYR MEDRAD MARK 7 150ML (SYRINGE) ×2 IMPLANT
TUBING CONTRAST HIGH PRESS 72 (TUBING) ×2 IMPLANT
WIRE GUIDERIGHT .035X150 (WIRE) ×2 IMPLANT

## 2021-03-27 NOTE — Progress Notes (Addendum)
Patient ID: Saahir Gidcumb, male   DOB: [birth year], 80 y.o.   MRN: [id number] [facility] PROGRESS NOTE  Braelon Brockmeier [id number] DOB: [birth year] DOA: 03/20/2021 PCP: Ranae Plumber, PA  HPI/Subjective: Patient was feeling okay.  Offers no complaints.  His foot pain is a little bit less.  Awaiting angiogram today.  Objective: Vitals:   03/27/21 0506 03/27/21 0834  BP: (!) 153/67 138/82  Pulse: 72 69  Resp: 16 16  Temp: 99 F (37.2 C) 98.4 F (36.9 C)  SpO2: 98% 98%    Intake/Output Summary (Last 24 hours) at 03/27/2021 1506 Last data filed at 03/27/2021 0700 Gross per 24 hour  Intake 840.3 ml  Output --  Net 840.3 ml   Filed Weights   03/20/21 0332 03/26/21 1322  Weight: 105.2 kg 105.2 kg    ROS: Review of Systems  Respiratory: Negative for cough and shortness of breath.   Cardiovascular: Negative for chest pain.  Gastrointestinal: Negative for abdominal pain, nausea and vomiting.  Musculoskeletal: Positive for joint pain.   Exam: Physical Exam HENT:     Head: Normocephalic.     Mouth/Throat:     Pharynx: No oropharyngeal exudate.  Eyes:     General: Lids are normal.     Conjunctiva/sclera: Conjunctivae normal.     Pupils: Pupils are equal, round, and reactive to light.  Cardiovascular:     Rate and Rhythm: Normal rate and regular rhythm.     Heart sounds: Normal heart sounds, S1 normal and S2 normal.  Pulmonary:     Breath sounds: Normal breath sounds. No decreased breath sounds, wheezing, rhonchi or rales.  Abdominal:     Palpations: Abdomen is soft.     Tenderness: There is no abdominal tenderness.  Musculoskeletal:     Right lower leg: Swelling present.     Left lower leg: Swelling present.  Skin:    General: Skin is warm.     Comments: Right foot covered.  Neurological:     Mental Status: He is alert and oriented to person, place, and time.       Data Reviewed: Basic Metabolic Panel: Recent Labs  Lab 03/22/21 0451 03/23/21 0446  03/24/21 0540 03/26/21 0426 03/27/21 0506  NA 134* 136 138 138 137  K 4.2 4.6 4.6 4.2 5.0  CL 104 106 107 104 103  CO2 '23 23 24 24 24  '$ GLUCOSE 262* 185* 107* 84 202*  BUN 41* 42* 53* 56* 60*  CREATININE 1.49* 1.47* 1.54* 1.34* 1.37*  CALCIUM 8.2* 8.4* 8.6* 8.5* 8.7*  MG  --   --  2.2  --   --    CBC: Recent Labs  Lab 03/21/21 0427 03/22/21 0451 03/23/21 0446 03/24/21 0540 03/26/21 0426  WBC 6.4 5.1 4.0 3.7* 3.5*  HGB 9.0* 8.2* 8.0* 8.4* 8.6*  HCT 27.0* 24.6* 24.4* 24.9* 25.9*  MCV 91.2 92.8 92.8 91.5 91.8  PLT 84* 84* 96* 118* 146*   BNP (last 3 results) Recent Labs    03/20/21 0346  BNP 155.6*    CBG: Recent Labs  Lab 03/26/21 1324 03/26/21 1623 03/26/21 2043 03/27/21 0805 03/27/21 1213  GLUCAP 96 109* 377* 197* 189*    Recent Results (from the past 240 hour(s))  Blood culture (routine x 2)     Status: Abnormal   Collection Time: 03/20/21  3:52 AM   Specimen: BLOOD LEFT FOREARM  Result Value Ref Range Status   Specimen Description   Final    BLOOD LEFT FOREARM Performed at  [facility], [place] [address] [place], [place] [address]    Special Requests   Final    BOTTLES DRAWN AEROBIC AND ANAEROBIC Blood Culture adequate volume Performed at [facility], [address] [place], [place] [address]    Culture  Setup Time   Final    AEROBIC BOTTLE ONLY GRAM POSITIVE COCCI Organism ID to follow CRITICAL RESULT CALLED TO, READ BACK BY AND VERIFIED WITH: ALEX CHAPPELL PHARMD 2225 03/20/21 HNM Performed at [facility], [address] [place],  [address]    Culture STAPHYLOCOCCUS AUREUS (A)  Final   Report Status 03/23/2021 FINAL  Final   Organism ID, Bacteria STAPHYLOCOCCUS AUREUS  Final      Susceptibility   Staphylococcus aureus - MIC*    CIPROFLOXACIN <=0.5 SENSITIVE Sensitive     ERYTHROMYCIN RESISTANT Resistant     GENTAMICIN <=0.5 SENSITIVE Sensitive     OXACILLIN 0.5 SENSITIVE Sensitive     TETRACYCLINE <=1  SENSITIVE Sensitive     VANCOMYCIN <=0.5 SENSITIVE Sensitive     TRIMETH/SULFA <=10 SENSITIVE Sensitive     CLINDAMYCIN RESISTANT Resistant     RIFAMPIN <=0.5 SENSITIVE Sensitive     Inducible Clindamycin POSITIVE Resistant     * STAPHYLOCOCCUS AUREUS  Blood Culture ID Panel (Reflexed)     Status: Abnormal   Collection Time: 03/20/21  3:52 AM  Result Value Ref Range Status   Enterococcus faecalis NOT DETECTED NOT DETECTED Final   Enterococcus Faecium NOT DETECTED NOT DETECTED Final   Listeria monocytogenes NOT DETECTED NOT DETECTED Final   Staphylococcus species DETECTED (A) NOT DETECTED Final    Comment: CRITICAL RESULT CALLED TO, READ BACK BY AND VERIFIED WITH: ALEX CHAPPELL PHARMD 2225 03/20/21 HNM    Staphylococcus aureus (BCID) DETECTED (A) NOT DETECTED Final    Comment: CRITICAL RESULT CALLED TO, READ BACK BY AND VERIFIED WITH: [place] [id number] 03/20/21 HNM    Staphylococcus epidermidis NOT DETECTED NOT DETECTED Final   Staphylococcus lugdunensis NOT DETECTED NOT DETECTED Final   Streptococcus species NOT DETECTED NOT DETECTED Final   Streptococcus agalactiae NOT DETECTED NOT DETECTED Final   Streptococcus pneumoniae NOT DETECTED NOT DETECTED Final   Streptococcus pyogenes NOT DETECTED NOT DETECTED Final   A.calcoaceticus-baumannii NOT DETECTED NOT DETECTED Final   Bacteroides fragilis NOT DETECTED NOT DETECTED Final   Enterobacterales NOT DETECTED NOT DETECTED Final   Enterobacter cloacae complex NOT DETECTED NOT DETECTED Final   Escherichia coli NOT DETECTED NOT DETECTED Final   Klebsiella aerogenes NOT DETECTED NOT DETECTED Final   Klebsiella oxytoca NOT DETECTED NOT DETECTED Final   Klebsiella pneumoniae NOT DETECTED NOT DETECTED Final   Proteus species NOT DETECTED NOT DETECTED Final   Salmonella species NOT DETECTED NOT DETECTED Final   Serratia marcescens NOT DETECTED NOT DETECTED Final   Haemophilus influenzae NOT DETECTED NOT DETECTED Final   Neisseria  meningitidis NOT DETECTED NOT DETECTED Final   Pseudomonas aeruginosa NOT DETECTED NOT DETECTED Final   Stenotrophomonas maltophilia NOT DETECTED NOT DETECTED Final   Candida albicans NOT DETECTED NOT DETECTED Final   Candida auris NOT DETECTED NOT DETECTED Final   Candida glabrata NOT DETECTED NOT DETECTED Final   Candida krusei NOT DETECTED NOT DETECTED Final   Candida parapsilosis NOT DETECTED NOT DETECTED Final   Candida tropicalis NOT DETECTED NOT DETECTED Final   Cryptococcus neoformans/gattii NOT DETECTED NOT DETECTED Final   Meth resistant mecA/C and MREJ NOT DETECTED NOT DETECTED Final    Comment: Performed  at [facility], [address] [place], [place] [address]  Resp Panel by RT-PCR (Flu A&B, Covid) Nasopharyngeal Swab     Status: None   Collection Time: 03/20/21  4:01 AM   Specimen: Nasopharyngeal Swab; Nasopharyngeal(NP) swabs in vial transport medium  Result Value Ref Range Status   SARS Coronavirus 2 by RT PCR NEGATIVE NEGATIVE Final    Comment: (NOTE) SARS-CoV-2 target nucleic acids are NOT DETECTED.  The SARS-CoV-2 RNA is generally detectable in upper respiratory specimens during the acute phase of infection. The lowest concentration of SARS-CoV-2 viral copies this assay can detect is 138 copies/mL. A negative result does not preclude SARS-Cov-2 infection and should not be used as the sole basis for treatment or other patient management decisions. A negative result may occur with  improper specimen collection/handling, submission of specimen other than nasopharyngeal swab, presence of viral mutation(s) within the areas targeted by this assay, and inadequate number of viral copies(<138 copies/mL). A negative result must be combined with clinical observations, patient history, and epidemiological information. The expected result is Negative.  Fact Sheet for Patients:  EntrepreneurPulse.com.au  Fact Sheet for Healthcare Providers:   IncredibleEmployment.be  This test is no t yet approved or cleared by the [place] FDA and  has been authorized for detection and/or diagnosis of SARS-CoV-2 by FDA under an Emergency Use Authorization (EUA). This EUA will remain  in effect (meaning this test can be used) for the duration of the COVID-19 declaration under Section 564(b)(1) of the Act, 21 U.S.C.section 360bbb-3(b)(1), unless the authorization is terminated  or revoked sooner.       Influenza A by PCR NEGATIVE NEGATIVE Final   Influenza B by PCR NEGATIVE NEGATIVE Final    Comment: (NOTE) The Xpert Xpress SARS-CoV-2/FLU/RSV plus assay is intended as an aid in the diagnosis of influenza from Nasopharyngeal swab specimens and should not be used as a sole basis for treatment. Nasal washings and aspirates are unacceptable for Xpert Xpress SARS-CoV-2/FLU/RSV testing.  Fact Sheet for Patients: EntrepreneurPulse.com.au  Fact Sheet for Healthcare Providers: IncredibleEmployment.be  This test is not yet approved or cleared by the [place] FDA and has been authorized for detection and/or diagnosis of SARS-CoV-2 by FDA under an Emergency Use Authorization (EUA). This EUA will remain in effect (meaning this test can be used) for the duration of the COVID-19 declaration under Section 564(b)(1) of the Act, 21 U.S.C. section 360bbb-3(b)(1), unless the authorization is terminated or revoked.  Performed at [facility], [address] [place], [place] [address]   Blood culture (routine x 2)     Status: None   Collection Time: 03/20/21 11:43 AM   Specimen: BLOOD  Result Value Ref Range Status   Specimen Description BLOOD WRIST  Final   Special Requests   Final    BOTTLES DRAWN AEROBIC AND ANAEROBIC Blood Culture results may not be optimal due to an excessive volume of blood received in culture bottles   Culture   Final    NO GROWTH 5 DAYS Performed  at [facility], [address], [place], [place] [address]    Report Status 03/25/2021 FINAL  Final  Aerobic/Anaerobic Culture w Gram Stain (surgical/deep wound)     Status: None   Collection Time: 03/20/21  8:41 PM   Specimen: PATH Other; Tissue  Result Value Ref Range Status   Specimen Description   Final    TISSUE Performed at [facility], [address], [place], [place] [address]  Special Requests   Final    RIGHT GREAT TOE Performed at [facility], [place], [place] [address]    Gram Stain   Final    ABUNDANT WBC PRESENT,BOTH PMN AND MONONUCLEAR FEW GRAM POSITIVE COCCI    Culture   Final    ABUNDANT STAPHYLOCOCCUS AUREUS NO ANAEROBES ISOLATED Performed at [facility], [place] [address] [place], [place] [address]    Report Status 03/26/2021 FINAL  Final   Organism ID, Bacteria STAPHYLOCOCCUS AUREUS  Final      Susceptibility   Staphylococcus aureus - MIC*    CIPROFLOXACIN <=0.5 SENSITIVE Sensitive     ERYTHROMYCIN >=8 RESISTANT Resistant     GENTAMICIN <=0.5 SENSITIVE Sensitive     OXACILLIN 0.5 SENSITIVE Sensitive     TETRACYCLINE <=1 SENSITIVE Sensitive     VANCOMYCIN <=0.5 SENSITIVE Sensitive     TRIMETH/SULFA <=10 SENSITIVE Sensitive     CLINDAMYCIN RESISTANT Resistant     RIFAMPIN <=0.5 SENSITIVE Sensitive     Inducible Clindamycin POSITIVE Resistant     * ABUNDANT STAPHYLOCOCCUS AUREUS  Culture, blood (single) w Reflex to ID Panel     Status: None   Collection Time: 03/22/21  4:51 AM   Specimen: BLOOD  Result Value Ref Range Status   Specimen Description BLOOD  RIGHT ARM  Final   Special Requests   Final    BOTTLES DRAWN AEROBIC AND ANAEROBIC Blood Culture results may not be optimal due to an excessive volume of blood received in culture bottles   Culture   Final    NO GROWTH 5 DAYS Performed at [facility], [address], [place], [place] [address]    Report Status 03/27/2021 FINAL  Final      Studies: ECHO TEE  Result Date: 03/26/2021    TRANSESOPHOGEAL ECHO REPORT   Patient Name:   ZERIN GRONDAHL Date of Exam: 03/26/2021 Medical Rec #:  [id number]      Height:       72.0 in Accession #:    [id number]     Weight:       232.0 lb Date of Birth:  10-Jun-1941      BSA:          2.269 m Patient Age:    63 years       BP:           129/82 mmHg Patient Gender: M              HR:           61 bpm. Exam Location:  [facility] Procedure: Transesophageal Echo, Cardiac Doppler and Color Doppler Indications:     Bacteremia 790.7 /R78.81  History:         Patient has prior history of Echocardiogram examinations, most                  recent 03/24/2021. Pacemaker; Risk Factors:Hypertension and                  Diabetes. S/P TAVR.                  Aortic Valve: Sapien prosthetic, stented (TAVR) valve is                  present in the aortic position.  Sonographer:     Sherrie Sport RDCS (AE) Referring Phys:  [id number] Rise Mu Diagnosing Phys: Kate Sable MD PROCEDURE: The transesophogeal probe was passed  without difficulty through the esophogus of the patient. Sedation performed by performing physician. The patient developed no complications during the procedure. IMPRESSIONS  1. Left ventricular ejection fraction, by estimation, is 55 to 60%. The left ventricle has normal function.  2. Right ventricular systolic function is normal. The right ventricular size is normal.  3. Left atrial size was mildly dilated. No left atrial/left atrial appendage thrombus was detected.  4. The mitral valve is normal in structure. Mild mitral valve regurgitation.  5. The aortic valve has been repaired/replaced. Aortic valve regurgitation is not visualized. There is a Sapien prosthetic (TAVR) valve present in the aortic position. Echo findings are consistent with normal structure and function of the aortic valve prosthesis. Conclusion(s)/Recommendation(s): No evidence of vegetation/infective endocarditis on this transesophageal  echocardiogram. FINDINGS  Left Ventricle: Left ventricular ejection fraction, by estimation, is 55 to 60%. The left ventricle has normal function. The left ventricular internal cavity size was normal in size. Right Ventricle: The right ventricular size is normal. No increase in right ventricular wall thickness. Right ventricular systolic function is normal. Left Atrium: Left atrial size was mildly dilated. No left atrial/left atrial appendage thrombus was detected. Right Atrium: Right atrial size was normal in size. Pericardium: There is no evidence of pericardial effusion. Mitral Valve: The mitral valve is normal in structure. Mild mitral valve regurgitation. Tricuspid Valve: The tricuspid valve is normal in structure. Tricuspid valve regurgitation is not demonstrated. Aortic Valve: The aortic valve has been repaired/replaced. Aortic valve regurgitation is not visualized. There is a Sapien prosthetic, stented (TAVR) valve present in the aortic position. Echo findings are consistent with normal structure and function of  the aortic valve prosthesis. Pulmonic Valve: The pulmonic valve was grossly normal. Pulmonic valve regurgitation is not visualized. Aorta: The aortic root is normal in size and structure. Venous: The inferior vena cava was not well visualized. IAS/Shunts: No atrial level shunt detected by color flow Doppler. Kate Sable MD Electronically signed by Kate Sable MD Signature Date/Time: 03/26/2021/2:56:14 PM    Final     Scheduled Meds: . allopurinol  100 mg Oral Daily  . amLODipine  5 mg Oral Daily  . aspirin EC  81 mg Oral Daily  . baclofen  10 mg Oral BID  . carvedilol  6.25 mg Oral QHS  . Chlorhexidine Gluconate Cloth  6 each Topical Daily  . cholecalciferol  5,000 Units Oral Daily  . clopidogrel  75 mg Oral Daily  . docusate sodium  200 mg Oral BID  . furosemide  20 mg Oral Daily  . gabapentin  100 mg Oral Daily  . insulin aspart  0-5 Units Subcutaneous QHS  . insulin  aspart  0-9 Units Subcutaneous TID WC  . insulin aspart  10 Units Subcutaneous TID WC  . insulin glargine  18 Units Subcutaneous QHS  . isosorbide mononitrate  30 mg Oral Daily  . linaclotide  145 mcg Oral Daily  . loratadine  10 mg Oral Daily  . multivitamin with minerals  1 tablet Oral Daily  . pantoprazole  40 mg Oral Daily  . rosuvastatin  40 mg Oral Daily  . sertraline  200 mg Oral Daily  . sodium chloride flush  10-40 mL Intracatheter Q12H  . tamsulosin  0.4 mg Oral Daily   Continuous Infusions: . sodium chloride 250 mL (03/26/21 2115)  . sodium chloride    .  ceFAZolin (ANCEF) IV 2 g (03/27/21 0519)    Assessment/Plan:  1. MSSA bacteremia.  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  Patient currently on cefazolin.  TEE negative for endocarditis.  Patient has a PICC line in and will need IV cefazolin through 04/19/21.  Spoke with transitional care team to try to make things happen for tomorrow disposition with home health and IV antibiotics. 2. Peripheral vascular disease.  Having repeat angiogram today 3. Dizziness.  Improved with IV fluid hydration 4. History of CAD.  Continue aspirin, Plavix Crestor Imdur 5. 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 History of heart block status post pacemaker 7. Chronic thrombocytopenia.  Last platelet count up at 146 recheck tomorrow. 8. 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  Watch creatinine again tomorrow after angiogram today. 9. Anemia of chronic disease 10. Depression on Zoloft 11. BPH on Flomax 12. Hyperkalemia this morning given dose of Lokelma recheck creatinine and potassium tomorrow morning      Code Status:     Code Status Orders  (From admission, onward)         Start     Ordered   03/20/21 0731  Full code  Continuous        03/20/21 0731        Code Status History    Date  Active Date Inactive Code Status Order ID Comments User Context   02/15/2021 1641 02/22/2021 2133 Full Code [id number]  Barb Merino, MD Inpatient   02/13/2021 1759 02/15/2021 1640 DNR [id number]  Rhetta Mura, DO Inpatient   11/21/2020 2137 11/22/2020 2249 DNR [id number]  Lenore Cordia, MD ED   Advance Care Planning Activity    Advance Directive Documentation   Flowsheet Row Most Recent Value  Type of Advance Directive Living will  Pre-existing out of facility DNR order (yellow form or pink MOST form) --  "MOST" Form in Place? --     Family Communication: Spoke with fianc on the phone Disposition Plan: Status is: Inpatient  Dispo: The patient is from: Home              Anticipated d/c is to: Home with home              Patient currently having angiogram today.  Reassess kidney function tomorrow.   Difficult to place patient.  No.  Antibiotics:  cefazolin  Time spent: 28 minutes  Kevin

## 2021-03-27 NOTE — Progress Notes (Signed)
Progress Note  Patient Name: Nathan Singleton Date of Encounter: 03/27/2021  [facility] Cardiologist: End, [facility]  Subjective   Reports some pain in his right foot but overall feeling relatively well Denies shortness of breath or chest pain Discussed recent findings on TEE, reports his throat feels fine  Inpatient Medications    Scheduled Meds: . allopurinol  100 mg Oral Daily  . amLODipine  5 mg Oral Daily  . aspirin EC  81 mg Oral Daily  . baclofen  10 mg Oral BID  . carvedilol  6.25 mg Oral QHS  . Chlorhexidine Gluconate Cloth  6 each Topical Daily  . cholecalciferol  5,000 Units Oral Daily  . clopidogrel  75 mg Oral Daily  . docusate sodium  200 mg Oral BID  . furosemide  20 mg Oral Daily  . gabapentin  100 mg Oral Daily  . insulin aspart  0-5 Units Subcutaneous QHS  . insulin aspart  0-9 Units Subcutaneous TID WC  . insulin aspart  10 Units Subcutaneous TID WC  . insulin glargine  18 Units Subcutaneous QHS  . isosorbide mononitrate  30 mg Oral Daily  . linaclotide  145 mcg Oral Daily  . loratadine  10 mg Oral Daily  . multivitamin with minerals  1 tablet Oral Daily  . pantoprazole  40 mg Oral Daily  . rosuvastatin  40 mg Oral Daily  . sertraline  200 mg Oral Daily  . sodium chloride flush  10-40 mL Intracatheter Q12H  . tamsulosin  0.4 mg Oral Daily   Continuous Infusions: . sodium chloride 250 mL (03/26/21 2115)  . sodium chloride 50 mL/hr at 03/27/21 0110  .  ceFAZolin (ANCEF) IV 2 g (03/27/21 0519)   PRN Meds: sodium chloride, acetaminophen, albuterol, dextromethorphan-guaiFENesin, diphenoxylate-atropine, hydrALAZINE, HYDROcodone-acetaminophen, ondansetron (ZOFRAN) IV, senna-docusate, sodium chloride flush   Vital Signs    Vitals:   03/26/21 1955 03/27/21 0152 03/27/21 0506 03/27/21 0834  BP: (!) 139/58 (!) 124/54 (!) 153/67 138/82  Pulse: 64 66 72 69  Resp: '17 16 16 16  '$ Temp: 98.9 F (37.2 C) 98.6 F (37 C) 99 F (37.2 C) 98.4 F (36.9 C)   TempSrc:      SpO2: 98% 98% 98% 98%  Weight:      Height:        Intake/Output Summary (Last 24 hours) at 03/27/2021 1406 Last data filed at 03/27/2021 0700 Gross per 24 hour  Intake 840.3 ml  Output --  Net 840.3 ml   Last 3 Weights 03/26/2021 03/20/2021 03/19/2021  Weight (lbs) 231 lb 14.8 oz 232 lb 252 lb  Weight (kg) 105.2 kg 105.235 kg 114.306 kg      Telemetry    Normal sinus rhythm- Personally Reviewed  ECG     Personally Reviewed  Physical Exam   GEN: No acute distress.   Neck: No JVD Cardiac: RRR, 1-2/6 SEM,  rubs, or gallops.  Respiratory: Clear to auscultation bilaterally. GI: Soft, nontender, non-distended  MS: No edema; No deformity. Neuro:  Nonfocal  Psych: Normal affect   Labs    High Sensitivity Troponin:  No results for input(s): TROPONINIHS in the last 720 hours.    Chemistry Recent Labs  Lab 03/24/21 0540 03/26/21 0426 03/27/21 0506  NA 138 138 137  K 4.6 4.2 5.0  CL 107 104 103  CO2 '24 24 24  '$ GLUCOSE 107* 84 202*  BUN 53* 56* 60*  CREATININE 1.54* 1.34* 1.37*  CALCIUM 8.6* 8.5* 8.7*  GFRNONAA 46*  54* 53*  ANIONGAP '7 10 10     '$ Hematology Recent Labs  Lab 03/23/21 0446 03/24/21 0540 03/26/21 0426  WBC 4.0 3.7* 3.5*  RBC 2.63* 2.72* 2.82*  HGB 8.0* 8.4* 8.6*  HCT 24.4* 24.9* 25.9*  MCV 92.8 91.5 91.8  MCH 30.4 30.9 30.5  MCHC 32.8 33.7 33.2  RDW 13.7 13.7 13.6  PLT 96* 118* 146*    BNPNo results for input(s): BNP, PROBNP in the last 168 hours.   DDimer No results for input(s): DDIMER in the last 168 hours.   Radiology    ECHO TEE  Result Date: 03/26/2021    TRANSESOPHOGEAL ECHO REPORT   Patient Name:   [name] Date of Exam: 03/26/2021 Medical Rec #:  [id number]      Height:       72.0 in Accession #:    [id number]     Weight:       232.0 lb Date of Birth:  [birth year]      BSA:          2.269 m Patient Age:    80 years       BP:           129/82 mmHg Patient Gender: M              HR:           61 bpm. Exam Location:   [facility] Procedure: Transesophageal Echo, Cardiac Doppler and Color Doppler Indications:     Bacteremia 790.7 /R78.81  History:         Patient has prior history of Echocardiogram examinations, most                  recent 03/24/2021. Pacemaker; Risk Factors:Hypertension and                  Diabetes. S/P TAVR.                  Aortic Valve: Sapien prosthetic, stented (TAVR) valve is                  present in the aortic position.  Sonographer:     Sherrie Sport RDCS (AE) Referring Phys:  [id number] Rise Mu Diagnosing Phys: Kate Sable MD PROCEDURE: The transesophogeal probe was passed without difficulty through the esophogus of the patient. Sedation performed by performing physician. The patient developed no complications during the procedure. IMPRESSIONS  1. Left ventricular ejection fraction, by estimation, is 55 to 60%. The left ventricle has normal function.  2. Right ventricular systolic function is normal. The right ventricular size is normal.  3. Left atrial size was mildly dilated. No left atrial/left atrial appendage thrombus was detected.  4. The mitral valve is normal in structure. Mild mitral valve regurgitation.  5. The aortic valve has been repaired/replaced. Aortic valve regurgitation is not visualized. There is a Sapien prosthetic (TAVR) valve present in the aortic position. Echo findings are consistent with normal structure and function of the aortic valve prosthesis. Conclusion(s)/Recommendation(s): No evidence of vegetation/infective endocarditis on this transesophageal echocardiogram. FINDINGS  Left Ventricle: Left ventricular ejection fraction, by estimation, is 55 to 60%. The left ventricle has normal function. The left ventricular internal cavity size was normal in size. Right Ventricle: The right ventricular size is normal. No increase in right ventricular wall thickness. Right ventricular systolic function is normal. Left Atrium: Left atrial size was mildly dilated. No left atrial/left  atrial appendage thrombus was detected. Right Atrium: Right  atrial size was normal in size. Pericardium: There is no evidence of pericardial effusion. Mitral Valve: The mitral valve is normal in structure. Mild mitral valve regurgitation. Tricuspid Valve: The tricuspid valve is normal in structure. Tricuspid valve regurgitation is not demonstrated. Aortic Valve: The aortic valve has been repaired/replaced. Aortic valve regurgitation is not visualized. There is a Sapien prosthetic, stented (TAVR) valve present in the aortic position. Echo findings are consistent with normal structure and function of  the aortic valve prosthesis. Pulmonic Valve: The pulmonic valve was grossly normal. Pulmonic valve regurgitation is not visualized. Aorta: The aortic root is normal in size and structure. Venous: The inferior vena cava was not well visualized. IAS/Shunts: No atrial level shunt detected by color flow Doppler. Kate Sable MD Electronically signed by Kate Sable MD Signature Date/Time: 03/26/2021/2:56:14 PM    Final     Cardiac Studies   Transesophageal echo 1. Left ventricular ejection fraction, by estimation, is 55 to 60%. The  left ventricle has normal function.  2. Right ventricular systolic function is normal. The right ventricular  size is normal.  3. Left atrial size was mildly dilated. No left atrial/left atrial  appendage thrombus was detected.  4. The mitral valve is normal in structure. Mild mitral valve  regurgitation.  5. The aortic valve has been repaired/replaced. Aortic valve  regurgitation is not visualized. There is a Sapien prosthetic (TAVR) valve  present in the aortic position. Echo findings are consistent with normal  structure and function of the aortic valve  prosthesis.    No evidence of vegetation/infective  endocarditis on this transesophageal  echocardiogram.   Patient Profile  80 year old male with history of CAD/PCI to RCA, SSS status post permanent  pacemaker, TAVR, COPD, CKD 3, previous gastric bypass presented to the hospital with purulent discharge and pain of the right great toe.  Underwent resection of great toe, diagnosed with MSSA bacteremia, currently on antibiotics.   Assessment & Plan    Bacteremia Transesophageal echo yesterday, no endocarditis noted Antibiotic regiment being managed by infectious disease  Coronary artery disease with stable angina, prior stent Continue aspirin, Plavix Crestor, beta-blocker  Essential hypertension Carvedilol, amlodipine, Lasix  Aortic valve disease,s/p TAVR,  Stable prosthetic valve on TEE, normal gradient on echo  SSS, pacemaker Followed by EP   Total encounter time more than 25 minutes  Greater than 50% was spent in counseling and coordination of care with the patient  [facility] will sign off.   Medication Recommendations: No changes to medications  other recommendations (labs, testing, etc): No further testing Follow up as an outpatient: Follow-up in clinic with Dr. Saunders Revel    For questions or updates, please contact [place] Please consult [email or web address] for contact info under        Signed, Ida Rogue, MD  03/27/2021, 2:06 PM

## 2021-03-27 NOTE — Progress Notes (Signed)
PHARMACY CONSULT NOTE FOR:  OUTPATIENT  PARENTERAL ANTIBIOTIC THERAPY (OPAT)  Indication: MSSA bacteremia and toe osteomyelitis Regimen: Cefazolin 2gm IV q8h End date: 04/19/2021  IV antibiotic discharge orders are pended. To discharging provider:  please sign these orders via discharge navigator,  Select New Orders & click on the button choice - Manage This Unsigned Work.     Thank you for allowing pharmacy to be a part of this patient's care.  Doreene Eland, PharmD, BCPS.   Work Cell: [phone] 03/27/2021 9:42 AM

## 2021-03-27 NOTE — Anesthesia Postprocedure Evaluation (Signed)
Anesthesia Post Note  Patient: Nathan Singleton  Procedure(s) Performed: AMPUTATION TOE (Right Toe)  Patient location during evaluation: PACU Anesthesia Type: General Level of consciousness: awake and alert and oriented Pain management: pain level controlled Vital Signs Assessment: post-procedure vital signs reviewed and stable Respiratory status: spontaneous breathing Cardiovascular status: blood pressure returned to baseline Anesthetic complications: no   No complications documented.   Last Vitals:  Vitals:   03/27/21 0506 03/27/21 0834  BP: (!) 153/67 138/82  Pulse: 72 69  Resp: 16 16  Temp: 37.2 C 36.9 C  SpO2: 98% 98%    Last Pain:  Vitals:   03/27/21 0834  TempSrc:   PainSc: 7                  Tabia Landowski

## 2021-03-27 NOTE — TOC Progression Note (Addendum)
Transition of Care [facility]) - Progression Note    Patient Details  Name: Nathan Singleton MRN: [id number] Date of Birth: 1941-11-27  [facility] of [facility]) CM/SW Contact  Su Hilt, RN Phone Number: 03/27/2021, 9:56 AM  Clinical Narrative:   Damaris Schooner with Carolynn Sayers with [facility], they are prepared for when the patient will DC home to manage the IV ABX, Alvis Lemmings will be managing the [facility] nursing and the PICC line care, the patient stated that he needs a 3 in 1, he has a RW at home, I notified Rhonda at [facility] to bring the 3 in 1   Expected Discharge Plan: [place] Barriers to Discharge: No Barriers Identified  Expected Discharge Plan and Services Expected Discharge Plan: [place]   Discharge Planning Services: CM Consult                                 [facility] Agency: [place] Date Maish Vaya: 03/23/21 Time [place]: [place] Representative spoke with at [place]: [place] ([place]) Interventions    Readmission Risk Interventions Readmission Risk Prevention Plan 03/23/2021 02/14/2021  Transportation Screening Complete Complete  Medication Review Press photographer) Complete Complete  PCP or Specialist appointment within 3-5 days of discharge Complete Complete  HRI or Home Care Consult Complete Complete  SW Recovery Care/Counseling Consult Complete Complete  Palliative Care Screening Not Applicable Complete  [place] Not Applicable Complete

## 2021-03-27 NOTE — Op Note (Signed)
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  Ultrasound guidance for vascular access left femoral artery             2.  Catheter placement into right superficial femoral artery from left femoral approach             3.  Selective right lower extremity angiogram             4.  StarClose closure device left femoral artery  EBL: 5 cc  Contrast: 35 cc  Fluoro Time: 1.5 minutes  Moderate Conscious Sedation Time: approximately 23 minutes using 2 mg of Versed and 50 mcg of Fentanyl              Indications:  Patient is a 80 y.o.male with worsening foot ulceration and infection with some gangrenous changes despite previous revascularization earlier this year.  With the foot failing and amputation being likely, angiogram was performed to ensure that there is not further revascularization options that would improve his limb salvage. The patient is brought in for angiography for further evaluation and potential treatment.  Due to the limb threatening nature of the situation, angiogram was performed for attempted limb salvage. The patient is aware that if the procedure fails, amputation would be expected.  The patient also understands that even with successful revascularization, amputation may still be required due to the severity of the situation.  Risks and benefits are discussed and informed consent is obtained.   Procedure:  The patient was identified and appropriate procedural time out was performed.  The patient was then placed supine on the table and prepped and draped in the usual sterile fashion. Moderate conscious sedation was administered during a face to face encounter with the patient throughout the procedure with my supervision of the  RN administering medicines and monitoring the patient's vital signs, pulse oximetry, telemetry and mental status throughout from the start of the procedure until the patient was taken to the recovery room. Ultrasound was used to evaluate the left common femoral artery.  It was patent .  A digital ultrasound image was acquired.  A Seldinger needle was used to access the left common femoral artery under direct ultrasound guidance and a permanent image was performed.  A 0.035 J wire was advanced without resistance and a 5Fr sheath was placed.   No previous aortoiliac disease was seen, so no aortogram was performed today.  I then crossed the aortic bifurcation and advanced to the right femoral head from the initial image and then the catheter was advanced into the mid right SFA to help opacify distally. Selective right lower extremity angiogram was then performed.  This included frog-leg image to see behind the knee prosthesis on the right to evaluate the popliteal artery.  This demonstrated a normal common femoral artery, profunda femoris artery, and superficial femoral artery.  The popliteal artery normal and the previously placed stent was patent.  There was a typical tibial trifurcation.  The anterior tibial artery was large and patent without stenosis and fed the foot briskly.  The peroneal artery was a fairly large second runoff vessel.  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  Given these findings, his perfusion was adequate for wound healing and  no endovascular revascularization was going to be of benefit.  I elected to terminate the procedure. The sheath was removed and StarClose closure device was deployed in the left femoral artery with excellent hemostatic result. The patient was taken to the recovery room in stable condition having tolerated the procedure well.  Findings:                           Right Lower Extremity:  Normal common femoral artery, profunda  femoris artery, and superficial femoral artery.  The popliteal artery normal and the previously placed stent was patent.  There was a typical tibial trifurcation.  The anterior tibial artery was large and patent without stenosis and fed the foot briskly.  The peroneal artery was a fairly large second runoff vessel.  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   Disposition: Patient was taken to the recovery room in stable condition having tolerated the procedure well.  Complications: None  Leotis Pain 03/27/2021 4:19 PM   This note was created with Dragon Medical transcription system. Any errors in dictation are purely unintentional.

## 2021-03-27 NOTE — Progress Notes (Signed)
PT Cancellation Note  Patient Details Name: Nathan Singleton MRN: [id number] DOB: [birth year]   Cancelled Treatment:    Reason Eval/Treat Not Completed: Patient at procedure or test/unavailable Pt out of room for vascular procedure (angiogram?), was out of room for TEE yesterday at similar time slot.  Pt will remain on PT caseload and we will continue to follow per further recs, etc from vascular procedure.  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    Kreg Shropshire, DPT 03/27/2021, 3:40 PM

## 2021-03-27 NOTE — Care Management Important Message (Signed)
Important Message  Patient Details  Name: Nathan Singleton MRN: [id number] Date of Birth: March 20, 1941   Medicare Important Message Given:  Yes     Juliann Pulse A Markey Deady 03/27/2021, 2:12 PM

## 2021-03-28 ENCOUNTER — Encounter: Payer: Self-pay | Admitting: Vascular Surgery

## 2021-03-28 DIAGNOSIS — E1129 Type 2 diabetes mellitus with other diabetic kidney complication: Secondary | ICD-10-CM

## 2021-03-28 DIAGNOSIS — M869 Osteomyelitis, unspecified: Secondary | ICD-10-CM

## 2021-03-28 DIAGNOSIS — I2510 Atherosclerotic heart disease of native coronary artery without angina pectoris: Secondary | ICD-10-CM | POA: Diagnosis not present

## 2021-03-28 DIAGNOSIS — R7881 Bacteremia: Secondary | ICD-10-CM | POA: Diagnosis not present

## 2021-03-28 DIAGNOSIS — I739 Peripheral vascular disease, unspecified: Secondary | ICD-10-CM | POA: Diagnosis not present

## 2021-03-28 LAB — CBC WITH DIFFERENTIAL/PLATELET
Abs Immature Granulocytes: 0.02 10*3/uL (ref 0.00–0.07)
Basophils Absolute: 0 10*3/uL (ref 0.0–0.1)
Basophils Relative: 1 %
Eosinophils Absolute: 0.2 10*3/uL (ref 0.0–0.5)
Eosinophils Relative: 5 %
HCT: 27.7 % — ABNORMAL LOW (ref 39.0–52.0)
Hemoglobin: 8.8 g/dL — ABNORMAL LOW (ref 13.0–17.0)
Immature Granulocytes: 1 %
Lymphocytes Relative: 16 %
Lymphs Abs: 0.7 10*3/uL (ref 0.7–4.0)
MCH: 29.9 pg (ref 26.0–34.0)
MCHC: 31.8 g/dL (ref 30.0–36.0)
MCV: 94.2 fL (ref 80.0–100.0)
Monocytes Absolute: 0.3 10*3/uL (ref 0.1–1.0)
Monocytes Relative: 7 %
Neutro Abs: 3.1 10*3/uL (ref 1.7–7.7)
Neutrophils Relative %: 70 %
Platelets: 184 10*3/uL (ref 150–400)
RBC: 2.94 MIL/uL — ABNORMAL LOW (ref 4.22–5.81)
RDW: 13.4 % (ref 11.5–15.5)
WBC: 4.4 10*3/uL (ref 4.0–10.5)
nRBC: 0 % (ref 0.0–0.2)

## 2021-03-28 LAB — BASIC METABOLIC PANEL
Anion gap: 10 (ref 5–15)
BUN: 51 mg/dL — ABNORMAL HIGH (ref 8–23)
CO2: 24 mmol/L (ref 22–32)
Calcium: 8.6 mg/dL — ABNORMAL LOW (ref 8.9–10.3)
Chloride: 105 mmol/L (ref 98–111)
Creatinine, Ser: 1.37 mg/dL — ABNORMAL HIGH (ref 0.61–1.24)
GFR, Estimated: 53 mL/min — ABNORMAL LOW (ref >60)
Glucose, Bld: 153 mg/dL — ABNORMAL HIGH (ref 70–99)
Potassium: 4.4 mmol/L (ref 3.5–5.1)
Sodium: 139 mmol/L (ref 135–145)

## 2021-03-28 LAB — GLUCOSE, CAPILLARY
Glucose-Capillary: 289 mg/dL — ABNORMAL HIGH (ref 70–99)
Glucose-Capillary: 211 mg/dL — ABNORMAL HIGH (ref 70–99)

## 2021-03-28 MED ORDER — HYDROCODONE-ACETAMINOPHEN 7.5-325 MG PO TABS: 1.0000 | ORAL_TABLET | Freq: Four times a day (QID) | ORAL | 0 refills | Status: AC | PRN

## 2021-03-28 MED ORDER — HYDROCODONE-ACETAMINOPHEN 7.5-325 MG PO TABS: 1.0000 | ORAL_TABLET | Freq: Four times a day (QID) | ORAL | 0 refills | Status: DC | PRN

## 2021-03-28 MED ORDER — CEFAZOLIN IV (FOR PTA / DISCHARGE USE ONLY): 2.0000 g | Freq: Three times a day (TID) | INTRAVENOUS | 0 refills | Status: AC

## 2021-03-28 MED ORDER — INSULIN GLARGINE 100 UNIT/ML SOLOSTAR PEN: 22.0000 [IU] | PEN_INJECTOR | Freq: Every day | SUBCUTANEOUS | 11 refills | Status: DC

## 2021-03-28 MED ORDER — AMLODIPINE BESYLATE 5 MG PO TABS: 5.0000 mg | ORAL_TABLET | Freq: Every day | ORAL | 0 refills | Status: DC

## 2021-03-28 NOTE — Plan of Care (Signed)
  Problem: Education: Goal: Knowledge of General Education information will improve Description: Including pain rating scale, medication(s)/side effects and non-pharmacologic comfort measures Outcome: Progressing   Problem: Health Behavior/Discharge Planning: Goal: Ability to manage health-related needs will improve Outcome: Progressing   Problem: Clinical Measurements: Goal: Ability to maintain clinical measurements within normal limits will improve Outcome: Progressing Goal: Will remain free from infection Outcome: Progressing Goal: Diagnostic test results will improve Outcome: Progressing Goal: Respiratory complications will improve Outcome: Progressing Goal: Cardiovascular complication will be avoided Outcome: Progressing   Problem: Activity: Goal: Risk for activity intolerance will decrease Outcome: Progressing   Problem: Nutrition: Goal: Adequate nutrition will be maintained Outcome: Progressing   Problem: Coping: Goal: Level of anxiety will decrease Outcome: Progressing   Problem: Elimination: Goal: Will not experience complications related to bowel motility Outcome: Progressing Goal: Will not experience complications related to urinary retention Outcome: Progressing   Problem: Pain Managment: Goal: General experience of comfort will improve Outcome: Progressing   Problem: Skin Integrity: Goal: Risk for impaired skin integrity will decrease Outcome: Progressing   Problem: Fluid Volume: Goal: Hemodynamic stability will improve Outcome: Progressing   Problem: Clinical Measurements: Goal: Diagnostic test results will improve Outcome: Progressing Goal: Signs and symptoms of infection will decrease Outcome: Progressing   Problem: Respiratory: Goal: Ability to maintain adequate ventilation will improve Outcome: Progressing

## 2021-03-28 NOTE — Discharge Instructions (Signed)
Bacteremia, Adult °Bacteremia is the presence of bacteria in the blood. When bacteria enter the bloodstream, they can cause a life-threatening reaction called sepsis. Sepsis is a medical emergency. °What are the causes? °This condition is caused by bacteria that get into the blood. Bacteria can enter the blood from an infection, including: °· A skin infection or injury, such as a burn or a cut. °· A lung infection (pneumonia). °· An infection in the stomach or intestines. °· An infection in the bladder or urinary system (urinary tract infection). °· A bacterial infection in another part of the body that spreads to the blood. °Bacteria can also enter the blood during a dental or medical procedure, from bleeding gums, or through use of an unclean needle. °What increases the risk? °This condition is more likely to develop in children, older adults, and people who have: °· A long-term (chronic) disease or condition like diabetes or chronic kidney failure. °· An artificial joint or heart valve, or heart valve disease. °· A tube inserted to treat a medical condition, such as a urinary catheter or IV. °· A weak disease-fighting system (immune system). °· Injected illegal drugs. °· Been hospitalized for more than 10 days in a row. °What are the signs or symptoms? °Symptoms of this condition include: °· Fever and chills. °· Fast heartbeat and shortness of breath. °· Dizziness, weakness, and low blood pressure. °· Confusion or anxiety. °· Pain in the abdomen, nausea, vomiting, and diarrhea. °Bacteremia that has spread to other parts of the body may cause symptoms in those areas. In some cases, there are no symptoms. °How is this diagnosed? °This condition may be diagnosed with a physical exam and tests, such as: °· Blood tests to check for bacteria (cultures) or other signs of infection. °· Tests of any tubes that you have had inserted. These tests check for a source of infection. °· Urine tests to check for bacteria in the  urine. °· Imaging tests, such as an X-ray, a CT scan, an MRI, or a heart ultrasound. These check for a source of infection in other parts of your body, such as your lungs, heart valves, or joints.   °How is this treated? °This condition is usually treated in the hospital. If you are treated at home, you may need to return to the hospital for medicines, blood tests, and evaluation. Treatment may include: °· Antibiotic medicines. These may be given by mouth or directly into your blood through an IV. You may need antibiotics for several weeks. At first, you may be given an antibiotic to kill most types of blood bacteria. If tests show that a certain kind of bacteria is causing the problem, you may be given a different antibiotic. °· IV fluids. °· Removing any catheter or device that could be a source of infection. °· Blood pressure and breathing support, if needed. °· Surgery to control the source or the spread of infection, such as surgery to remove an implanted device, abscess, or infected tissue. °Follow these instructions at home: °Medicines °· Take over-the-counter and prescription medicines only as told by your health care provider. °· If you were prescribed an antibiotic medicine, take it as told by your health care provider. Do not stop taking the antibiotic even if you start to feel better. °General instructions °· Rest as needed. Ask your health care provider when you may return to normal activities. °· Drink enough fluid to keep your urine pale yellow. °· Do not use any products that contain nicotine   or tobacco, such as cigarettes, e-cigarettes, and chewing tobacco. If you need help quitting, ask your health care provider. °· Keep all follow-up visits as told by your health care provider. This is important.   °How is this prevented? °· Wash your hands regularly with soap and water. If soap and water are not available, use hand sanitizer. °· You should wash your hands: °? After using the toilet or changing a  diaper. °? Before preparing, cooking, serving, or eating food. °? While caring for a sick person or while visiting someone in a hospital. °? Before and after changing bandages (dressings) over wounds. °· Clean any scrapes or cuts with soap and water and cover them with a clean bandage. °· Get vaccinations as recommended by your health care provider. °· Practice good oral hygiene. Brush your teeth two times a day, and floss regularly. °· Take good care of your skin. This includes bathing and moisturizing on a regular basis.   °Contact a health care provider if: °· Your symptoms get worse, and medicines do not help. °· You have severe pain. °Get help right away if you have: °· Pain. °· A fever or chills. °· Trouble breathing. °· A fast heart rate. °· Skin that is blotchy, pale, or clammy. °· Confusion. °· Weakness. °· Lack of energy or unusual sleepiness. °· New symptoms that develop after treatment has started. °These symptoms may represent a serious problem that is an emergency. Do not wait to see if the symptoms will go away. Get medical help right away. Call your local emergency services (911 in the [place] Do not drive yourself to the hospital. °Summary °· Bacteremia is the presence of bacteria in the blood. When bacteria enter the bloodstream, they can cause a life-threatening reaction called sepsis. °· Bacteremia is usually treated with antibiotic medicines in the hospital. °· If you were prescribed an antibiotic medicine, take it as told by your health care provider. Do not stop taking the antibiotic even if you start to feel better. °· Get help right away if you have any new symptoms that develop after treatment has started. °This information is not intended to replace advice given to you by your health care provider. Make sure you discuss any questions you have with your health care provider. °Document Revised: 04/21/2019 Document Reviewed: 04/21/2019 °[employer] Education © 2021 [employer] ° °

## 2021-03-28 NOTE — Plan of Care (Signed)

## 2021-03-28 NOTE — Discharge Summary (Signed)
[facility] NAME: Greylin Thorley    MR#:  [id number]  DATE OF BIRTH:  01/13/1941  DATE OF ADMISSION:  03/20/2021 ADMITTING PHYSICIAN: Ivor Costa, MD  DATE OF DISCHARGE: 03/28/2021  4:36 PM  PRIMARY CARE PHYSICIAN: Ranae Plumber, PA    ADMISSION DIAGNOSIS:  Cellulitis [L03.90] Cellulitis of toe of right foot [L03.031] Cellulitis of great toe, right [L03.031] Hypotension, unspecified hypotension type [I95.9]  DISCHARGE DIAGNOSIS:  Principal Problem:   Cellulitis of great toe, right Active Problems:   Hypertension   PVD (peripheral vascular disease) (HCC)   CAD (coronary artery disease)   Thrombocytopenia (HCC)   Normocytic anemia   COPD (chronic obstructive pulmonary disease) ([place])   Depression   GERD without esophagitis   Gout   Hyperlipidemia   Type II diabetes mellitus with renal manifestations ([place])   CKD stage 3 due to type 2 diabetes mellitus ([place])   Dizziness   MSSA bacteremia   Anemia of chronic disease   SECONDARY DIAGNOSIS:   Past Medical History:  Diagnosis Date  . Aneurysm (arteriovenous) of coronary vessels   . Arthritis   . Ascending aortic aneurysm ([place])   . CAD (coronary artery disease)   . Diabetes mellitus without complication ([place])   . History of GI bleed   . Hyperlipidemia associated with type 2 diabetes mellitus ([place])   . Hypertension associated with diabetes ([place])   . Presence of permanent cardiac pacemaker   . PVD (peripheral vascular disease) ([place])   . S/P TAVR (transcatheter aortic valve replacement)     HOSPITAL COURSE:   1.  MSSA bacteremia with great toe nonhealing ulcer and acute osteomyelitis (as per surgical pathology).  MSSA also growing out of the surgical culture.  Dr. 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  Patient on cefazolin.  TEE negative for endocarditis.  Patient already has a PICC line in.  Patient will get aggressive antibiotics since he  does have a bioprosthetic heart valve and pacemaker.  Cefazolin will continue through Apr 19, 2021.  Follow-up with infectious disease and home health.  Weekly labs with results to go to Dr. Ola Spurr.  PICC line care as per home health protocol.  PICC line to be removed after antibiotics completed. 2.  Peripheral vascular disease.  Patient had a repeat angiogram of the right lower extremity that showed the previous stent in the popliteal artery was patent.  The posterior tibial artery was chronically occluded.  Patient on aspirin and Plavix. 3.  Dizziness.  Improved with IV fluid hydration. 4.  History of CAD.  Continue aspirin, Plavix, Crestor and Imdur. 5.  History of TAVR with bioprosthetic aortic valve. 6.  History of heart block status post pacemaker. 7.  Thrombocytopenia likely secondary to infection.  Platelet count as low as 84 during the hospital course.  Platelet count upon discharge 184. 8.  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  Creatinine stable after angiogram yesterday.  Was given IV fluids prior and after the procedure.  Patient is on Lantus insulin.  Patient's fianc did not want the short acting insulin prior to meals. 9.  Anemia of chronic disease.  Last hemoglobin 8.8. 10.  Hyperkalemia yesterday given a dose of Lokelma yesterday.  Potassium 4.4 today. 11.  Depression on Zoloft 12.  BPH on Flomax 13.  Home health PT and RN set up  DISCHARGE CONDITIONS:   Satisfactory  CONSULTS OBTAINED:  Infectious  disease Vascular surgery Podiatry Cardiology  DRUG ALLERGIES:   Allergies  Allergen Reactions  . Atorvastatin Other (See Comments)    "bad for kidneys" Cramping   . Celecoxib Other (See Comments)    Other reaction(s): Other (See Comments) Kidney Problem Kidney Problem   . Dicyclomine Hcl Other (See Comments)    Stomach cramps  . Doxycycline Other (See Comments)    Per wife (per RN)- pt gets severe stomach pains even when takes with  food and PCP said to not take    . Metformin Other (See Comments)    DISCHARGE MEDICATIONS:   Allergies as of 03/28/2021      Reactions   Atorvastatin Other (See Comments)   "bad for kidneys" Cramping   Celecoxib Other (See Comments)   Other reaction(s): Other (See Comments) Kidney Problem Kidney Problem   Dicyclomine Hcl Other (See Comments)   Stomach cramps   Doxycycline Other (See Comments)   Per wife (per RN)- pt gets severe stomach pains even when takes with food and PCP said to not take   Metformin Other (See Comments)      Medication List    STOP taking these medications   calcium citrate-vitamin D 315-200 MG-UNIT tablet Commonly known as: CITRACAL+D   diphenoxylate-atropine 2.5-0.025 MG tablet Commonly known as: LOMOTIL   liraglutide 18 MG/3ML Sopn Commonly known as: VICTOZA   losartan 25 MG tablet Commonly known as: COZAAR     TAKE these medications   allopurinol 100 MG tablet Commonly known as: ZYLOPRIM Take 100 mg by mouth daily.   amLODipine 5 MG tablet Commonly known as: NORVASC Take 1 tablet (5 mg total) by mouth daily. What changed:   medication strength  how much to take   baclofen 10 MG tablet Commonly known as: LIORESAL Take 10 mg by mouth 2 (two) times daily.   Bayer Aspirin EC Low Dose 81 MG EC tablet Generic drug: aspirin Take 81 mg by mouth daily.   carvedilol 6.25 MG tablet Commonly known as: COREG Take 6.25 mg by mouth at bedtime.   ceFAZolin  IVPB Commonly known as: ANCEF Inject 2 g into the vein every 8 (eight) hours for 23 days. Indication: MSSA bacteremia and toe osteomyelitis First Dose: Yes Last Day of Therapy:  04/19/2021 Labs - Once weekly:  CBC/D, CMP, and CRP Method of administration: IV Push Method of administration may be changed at the discretion of home infusion pharmacist based upon assessment of the patient and/or caregiver's ability to self-administer the medication ordered.   cetirizine 10 MG  tablet Commonly known as: ZYRTEC Take 10 mg by mouth daily.   clopidogrel 75 MG tablet Commonly known as: PLAVIX Take 1 tablet (75 mg total) by mouth daily.   D-5000 125 MCG (5000 UT) Tabs Generic drug: Cholecalciferol Take 1 tablet by mouth daily.   docusate sodium 100 MG capsule Commonly known as: COLACE Take 200 mg by mouth 2 (two) times daily.   furosemide 20 MG tablet Commonly known as: LASIX Take 20 mg by mouth daily.   gabapentin 100 MG capsule Commonly known as: NEURONTIN Take 100 mg by mouth daily.   HYDROcodone-acetaminophen 7.5-325 MG tablet Commonly known as: NORCO Take 1 tablet by mouth every 6 (six) hours as needed for up to 4 days for severe pain. What changed:   how much to take  reasons to take this   insulin glargine 100 UNIT/ML Solostar Pen Commonly known as: LANTUS Inject 22 Units into the skin at bedtime. What changed:  how much to take   isosorbide mononitrate 30 MG 24 hr tablet Commonly known as: IMDUR Take 30 mg by mouth daily.   Linzess 145 MCG Caps capsule Generic drug: linaclotide Take 145 mcg by mouth daily.   multivitamin Tabs tablet Take 1 tablet by mouth daily.   pantoprazole 40 MG tablet Commonly known as: PROTONIX Take 1 tablet (40 mg total) by mouth daily.   rosuvastatin 40 MG tablet Commonly known as: CRESTOR Take 40 mg by mouth daily.   sertraline 100 MG tablet Commonly known as: ZOLOFT Take 200 mg by mouth daily.   tamsulosin 0.4 MG Caps capsule Commonly known as: FLOMAX Take 0.4 mg by mouth daily.            Discharge Care Instructions  (From admission, onward)         Start     Ordered   03/28/21 0000  Change dressing on IV access line weekly and PRN  (Home infusion instructions - [facility] )        03/28/21 0821           DISCHARGE INSTRUCTIONS:   Follow-up PMD 5 days Follow-up Dr. Ola Spurr 3 weeks Follow-up podiatry Dr. Cleda Mccreedy in 1 week  If you experience worsening of your  admission symptoms, develop shortness of breath, life threatening emergency, suicidal or homicidal thoughts you must seek medical attention immediately by calling 911 or calling your MD immediately  if symptoms less severe.  You Must read complete instructions/literature along with all the possible adverse reactions/side effects for all the Medicines you take and that have been prescribed to you. Take any new Medicines after you have completely understood and accept all the possible adverse reactions/side effects.   Please note  You were cared for by a hospitalist during your hospital stay. If you have any questions about your discharge medications or the care you received while you were in the hospital after you are discharged, you can call the unit and asked to speak with the hospitalist on call if the hospitalist that took care of you is not available. Once you are discharged, your primary care physician will handle any further medical issues. Please note that NO REFILLS for any discharge medications will be authorized once you are discharged, as it is imperative that you return to your primary care physician (or establish a relationship with a primary care physician if you do not have one) for your aftercare needs so that they can reassess your need for medications and monitor your lab values.    Today   CHIEF COMPLAINT:   Chief Complaint  Patient presents with  . Dizziness    HISTORY OF PRESENT ILLNESS:  Lael Garhart  is a 80 y.o. male came in with dizziness   VITAL SIGNS:  Blood pressure (!) 121/51, pulse 68, temperature 98.4 F (36.9 C), resp. rate 16, height 6' (1.829 m), weight 105.2 kg, SpO2 98 %.  I/O:    Intake/Output Summary (Last 24 hours) at 03/28/2021 1653 Last data filed at 03/28/2021 1349 Gross per 24 hour  Intake 480 ml  Output --  Net 480 ml    PHYSICAL EXAMINATION:  GENERAL:  80 y.o.-year-old patient lying in the bed with no acute distress.  EYES: Pupils  equal, round, reactive to light and accommodation. No scleral icterus. HEENT: Head atraumatic, normocephalic. Oropharynx and nasopharynx clear.  LUNGS: Normal breath sounds bilaterally, no wheezing, rales,rhonchi or crepitation. No use of accessory muscles of respiration.  CARDIOVASCULAR: S1,  S2 normal. No murmurs, rubs, or gallops.  ABDOMEN: Soft, non-tender, non-distended.  EXTREMITIES: trace pedal edema.  NEUROLOGIC: Cranial nerves II through XII are intact. Muscle strength 5/5 in all extremities. Sensation intact. Gait not checked.  PSYCHIATRIC: The patient is alert and oriented x 3.  SKIN: Right foot covered  DATA REVIEW:   CBC Recent Labs  Lab 03/28/21 0555  WBC 4.4  HGB 8.8*  HCT 27.7*  PLT 184    Chemistries  Recent Labs  Lab 03/24/21 0540 03/26/21 0426 03/28/21 0555  NA 138   < > 139  K 4.6   < > 4.4  CL 107   < > 105  CO2 24   < > 24  GLUCOSE 107*   < > 153*  BUN 53*   < > 51*  CREATININE 1.54*   < > 1.37*  CALCIUM 8.6*   < > 8.6*  MG 2.2  --   --    < > = values in this interval not displayed.    Microbiology Results  Results for orders placed or performed during the hospital encounter of 03/20/21  Blood culture (routine x 2)     Status: Abnormal   Collection Time: 03/20/21  3:52 AM   Specimen: BLOOD LEFT FOREARM  Result Value Ref Range Status   Specimen Description   Final    BLOOD LEFT FOREARM Performed at [facility], [place] [address] [place], [place] [address]    Special Requests   Final    BOTTLES DRAWN AEROBIC AND ANAEROBIC Blood Culture adequate volume Performed at [facility], [address] [place], [place] [address]    Culture  Setup Time   Final    AEROBIC BOTTLE ONLY GRAM POSITIVE COCCI Organism ID to follow CRITICAL RESULT CALLED TO, READ BACK BY AND VERIFIED WITH: ALEX CHAPPELL PHARMD 2225 03/20/21 HNM Performed at [facility], [address] [place], [place] [address]    Culture STAPHYLOCOCCUS AUREUS  (A)  Final   Report Status 03/23/2021 FINAL  Final   Organism ID, Bacteria STAPHYLOCOCCUS AUREUS  Final      Susceptibility   Staphylococcus aureus - MIC*    CIPROFLOXACIN <=0.5 SENSITIVE Sensitive     ERYTHROMYCIN RESISTANT Resistant     GENTAMICIN <=0.5 SENSITIVE Sensitive     OXACILLIN 0.5 SENSITIVE Sensitive     TETRACYCLINE <=1 SENSITIVE Sensitive     VANCOMYCIN <=0.5 SENSITIVE Sensitive     TRIMETH/SULFA <=10 SENSITIVE Sensitive     CLINDAMYCIN RESISTANT Resistant     RIFAMPIN <=0.5 SENSITIVE Sensitive     Inducible Clindamycin POSITIVE Resistant     * STAPHYLOCOCCUS AUREUS  Blood Culture ID Panel (Reflexed)     Status: Abnormal   Collection Time: 03/20/21  3:52 AM  Result Value Ref Range Status   Enterococcus faecalis NOT DETECTED NOT DETECTED Final   Enterococcus Faecium NOT DETECTED NOT DETECTED Final   Listeria monocytogenes NOT DETECTED NOT DETECTED Final   Staphylococcus species DETECTED (A) NOT DETECTED Final    Comment: CRITICAL RESULT CALLED TO, READ BACK BY AND VERIFIED WITH: ALEX CHAPPELL PHARMD 2225 03/20/21 HNM    Staphylococcus aureus (BCID) DETECTED (A) NOT DETECTED Final    Comment: CRITICAL RESULT CALLED TO, READ BACK BY AND VERIFIED WITH: ALEX CHAPPELL PHARMD 2225 03/20/21 HNM    Staphylococcus epidermidis NOT DETECTED NOT DETECTED Final   Staphylococcus lugdunensis NOT DETECTED NOT DETECTED Final   Streptococcus species NOT DETECTED NOT DETECTED Final  Streptococcus agalactiae NOT DETECTED NOT DETECTED Final   Streptococcus pneumoniae NOT DETECTED NOT DETECTED Final   Streptococcus pyogenes NOT DETECTED NOT DETECTED Final   A.calcoaceticus-baumannii NOT DETECTED NOT DETECTED Final   Bacteroides fragilis NOT DETECTED NOT DETECTED Final   Enterobacterales NOT DETECTED NOT DETECTED Final   Enterobacter cloacae complex NOT DETECTED NOT DETECTED Final   Escherichia coli NOT DETECTED NOT DETECTED Final   Klebsiella aerogenes NOT DETECTED NOT DETECTED Final    Klebsiella oxytoca NOT DETECTED NOT DETECTED Final   Klebsiella pneumoniae NOT DETECTED NOT DETECTED Final   Proteus species NOT DETECTED NOT DETECTED Final   Salmonella species NOT DETECTED NOT DETECTED Final   Serratia marcescens NOT DETECTED NOT DETECTED Final   Haemophilus influenzae NOT DETECTED NOT DETECTED Final   Neisseria meningitidis NOT DETECTED NOT DETECTED Final   Pseudomonas aeruginosa NOT DETECTED NOT DETECTED Final   Stenotrophomonas maltophilia NOT DETECTED NOT DETECTED Final   Candida albicans NOT DETECTED NOT DETECTED Final   Candida auris NOT DETECTED NOT DETECTED Final   Candida glabrata NOT DETECTED NOT DETECTED Final   Candida krusei NOT DETECTED NOT DETECTED Final   Candida parapsilosis NOT DETECTED NOT DETECTED Final   Candida tropicalis NOT DETECTED NOT DETECTED Final   Cryptococcus neoformans/gattii NOT DETECTED NOT DETECTED Final   Meth resistant mecA/C and MREJ NOT DETECTED NOT DETECTED Final    Comment: Performed at [facility], [address] [place], [place] [address]  Resp Panel by RT-PCR (Flu A&B, Covid) Nasopharyngeal Swab     Status: None   Collection Time: 03/20/21  4:01 AM   Specimen: Nasopharyngeal Swab; Nasopharyngeal(NP) swabs in vial transport medium  Result Value Ref Range Status   SARS Coronavirus 2 by RT PCR NEGATIVE NEGATIVE Final    Comment: (NOTE) SARS-CoV-2 target nucleic acids are NOT DETECTED.  The SARS-CoV-2 RNA is generally detectable in upper respiratory specimens during the acute phase of infection. The lowest concentration of SARS-CoV-2 viral copies this assay can detect is 138 copies/mL. A negative result does not preclude SARS-Cov-2 infection and should not be used as the sole basis for treatment or other patient management decisions. A negative result may occur with  improper specimen collection/handling, submission of specimen other than nasopharyngeal swab, presence of viral mutation(s) within the areas  targeted by this assay, and inadequate number of viral copies(<138 copies/mL). A negative result must be combined with clinical observations, patient history, and epidemiological information. The expected result is Negative.  Fact Sheet for Patients:  EntrepreneurPulse.com.au  Fact Sheet for Healthcare Providers:  IncredibleEmployment.be  This test is no t yet approved or cleared by the [place] FDA and  has been authorized for detection and/or diagnosis of SARS-CoV-2 by FDA under an Emergency Use Authorization (EUA). This EUA will remain  in effect (meaning this test can be used) for the duration of the COVID-19 declaration under Section 564(b)(1) of the Act, 21 U.S.C.section 360bbb-3(b)(1), unless the authorization is terminated  or revoked sooner.       Influenza A by PCR NEGATIVE NEGATIVE Final   Influenza B by PCR NEGATIVE NEGATIVE Final    Comment: (NOTE) The Xpert Xpress SARS-CoV-2/FLU/RSV plus assay is intended as an aid in the diagnosis of influenza from Nasopharyngeal swab specimens and should not be used as a sole basis for treatment. Nasal washings and aspirates are unacceptable for Xpert Xpress SARS-CoV-2/FLU/RSV testing.  Fact Sheet for Patients: EntrepreneurPulse.com.au  Fact Sheet for Healthcare Providers: IncredibleEmployment.be  This test is not yet  approved or cleared by the [place] and has been authorized for detection and/or diagnosis of SARS-CoV-2 by FDA under an Emergency Use Authorization (EUA). This EUA will remain in effect (meaning this test can be used) for the duration of the COVID-19 declaration under Section 564(b)(1) of the Act, 21 U.S.C. section 360bbb-3(b)(1), unless the authorization is terminated or revoked.  Performed at [facility], [address] [place], [place] [address]   Blood culture (routine x 2)     Status: None   Collection  Time: 03/20/21 11:43 AM   Specimen: BLOOD  Result Value Ref Range Status   Specimen Description BLOOD WRIST  Final   Special Requests   Final    BOTTLES DRAWN AEROBIC AND ANAEROBIC Blood Culture results may not be optimal due to an excessive volume of blood received in culture bottles   Culture   Final    NO GROWTH 5 DAYS Performed at [facility], [address], [place], [place] [address]    Report Status 03/25/2021 FINAL  Final  Aerobic/Anaerobic Culture w Gram Stain (surgical/deep wound)     Status: None   Collection Time: 03/20/21  8:41 PM   Specimen: PATH Other; Tissue  Result Value Ref Range Status   Specimen Description   Final    TISSUE Performed at [facility], [address] [place], [place] [address]    Special Requests   Final    RIGHT GREAT TOE Performed at [facility], [address] [place], [place] [address]    Gram Stain   Final    ABUNDANT WBC PRESENT,BOTH PMN AND MONONUCLEAR FEW GRAM POSITIVE COCCI    Culture   Final    ABUNDANT STAPHYLOCOCCUS AUREUS NO ANAEROBES ISOLATED Performed at [facility], [place] [address], [place], [place] [address]    Report Status 03/26/2021 FINAL  Final   Organism ID, Bacteria STAPHYLOCOCCUS AUREUS  Final      Susceptibility   Staphylococcus aureus - MIC*    CIPROFLOXACIN <=0.5 SENSITIVE Sensitive     ERYTHROMYCIN >=8 RESISTANT Resistant     GENTAMICIN <=0.5 SENSITIVE Sensitive     OXACILLIN 0.5 SENSITIVE Sensitive     TETRACYCLINE <=1 SENSITIVE Sensitive     VANCOMYCIN <=0.5 SENSITIVE Sensitive     TRIMETH/SULFA <=10 SENSITIVE Sensitive     CLINDAMYCIN RESISTANT Resistant     RIFAMPIN <=0.5 SENSITIVE Sensitive     Inducible Clindamycin POSITIVE Resistant     * ABUNDANT STAPHYLOCOCCUS AUREUS  Culture, blood (single) w Reflex to ID Panel     Status: None   Collection Time: 03/22/21  4:51 AM   Specimen: BLOOD  Result Value Ref Range Status   Specimen Description BLOOD  RIGHT ARM   Final   Special Requests   Final    BOTTLES DRAWN AEROBIC AND ANAEROBIC Blood Culture results may not be optimal due to an excessive volume of blood received in culture bottles   Culture   Final    NO GROWTH 5 DAYS Performed at [facility], [address] [place], [place] [address]    Report Status 03/27/2021 FINAL  Final    RADIOLOGY:  PERIPHERAL VASCULAR CATHETERIZATION  Result Date: 03/27/2021 See op note     Management plans discussed with the patient, family and they are in agreement.  CODE STATUS:     Code Status Orders  (From admission, onward)         Start     Ordered  03/20/21 0731  Full code  Continuous        03/20/21 0731        Code Status History    Date Active Date Inactive Code Status Order ID Comments User Context   02/15/2021 1641 02/22/2021 2133 Full Code [id number]  Barb Merino, MD Inpatient   02/13/2021 1759 02/15/2021 1640 DNR [id number]  Rhetta Mura, DO Inpatient   11/21/2020 2137 11/22/2020 2249 DNR [id number]  Lenore Cordia, MD ED   Advance Care Planning Activity    Advance Directive Documentation   Flowsheet Row Most Recent Value  Type of Advance Directive Living will  Pre-existing out of facility DNR order (yellow form or pink MOST form) --  "MOST" Form in Place? --      TOTAL TIME TAKING CARE OF THIS PATIENT: 35 minutes.    Loletha Grayer M.D on 03/28/2021 at 4:53 PM  Between 7am to 6pm - Pager - [phone]  After 6pm go to [email or web address] - password [email or web address]  [facility]  CC: Primary care physician; Ranae Plumber, [place]

## 2021-03-31 ENCOUNTER — Emergency Department
Admission: EM | Admit: 2021-03-31 | Discharge: 2021-03-31 | Disposition: A | Discharge status: Home / Self Care | Payer: 59 | Attending: Emergency Medicine | Admitting: Emergency Medicine

## 2021-03-31 ENCOUNTER — Other Ambulatory Visit: Payer: Self-pay

## 2021-03-31 DIAGNOSIS — E11628 Type 2 diabetes mellitus with other skin complications: Secondary | ICD-10-CM

## 2021-03-31 DIAGNOSIS — Z79899 Other long term (current) drug therapy: Secondary | ICD-10-CM | POA: Diagnosis not present

## 2021-03-31 DIAGNOSIS — Z7902 Long term (current) use of antithrombotics/antiplatelets: Secondary | ICD-10-CM | POA: Insufficient documentation

## 2021-03-31 DIAGNOSIS — I25118 Atherosclerotic heart disease of native coronary artery with other forms of angina pectoris: Secondary | ICD-10-CM | POA: Diagnosis not present

## 2021-03-31 DIAGNOSIS — I129 Hypertensive chronic kidney disease with stage 1 through stage 4 chronic kidney disease, or unspecified chronic kidney disease: Secondary | ICD-10-CM | POA: Insufficient documentation

## 2021-03-31 DIAGNOSIS — L97509 Non-pressure chronic ulcer of other part of unspecified foot with unspecified severity: Secondary | ICD-10-CM | POA: Diagnosis not present

## 2021-03-31 DIAGNOSIS — N183 Chronic kidney disease, stage 3 unspecified: Secondary | ICD-10-CM | POA: Insufficient documentation

## 2021-03-31 DIAGNOSIS — Z794 Long term (current) use of insulin: Secondary | ICD-10-CM | POA: Insufficient documentation

## 2021-03-31 DIAGNOSIS — Z951 Presence of aortocoronary bypass graft: Secondary | ICD-10-CM | POA: Diagnosis not present

## 2021-03-31 DIAGNOSIS — E11621 Type 2 diabetes mellitus with foot ulcer: Secondary | ICD-10-CM | POA: Insufficient documentation

## 2021-03-31 DIAGNOSIS — R197 Diarrhea, unspecified: Secondary | ICD-10-CM | POA: Insufficient documentation

## 2021-03-31 DIAGNOSIS — E1122 Type 2 diabetes mellitus with diabetic chronic kidney disease: Secondary | ICD-10-CM | POA: Diagnosis not present

## 2021-03-31 DIAGNOSIS — L089 Local infection of the skin and subcutaneous tissue, unspecified: Secondary | ICD-10-CM

## 2021-03-31 DIAGNOSIS — Z96653 Presence of artificial knee joint, bilateral: Secondary | ICD-10-CM | POA: Diagnosis not present

## 2021-03-31 DIAGNOSIS — Z87891 Personal history of nicotine dependence: Secondary | ICD-10-CM | POA: Diagnosis not present

## 2021-03-31 LAB — CBC WITH DIFFERENTIAL/PLATELET
Abs Immature Granulocytes: 0.01 10*3/uL (ref 0.00–0.07)
Basophils Absolute: 0 10*3/uL (ref 0.0–0.1)
Basophils Relative: 1 %
Eosinophils Absolute: 0.3 10*3/uL (ref 0.0–0.5)
Eosinophils Relative: 7 %
HCT: 24.8 % — ABNORMAL LOW (ref 39.0–52.0)
Hemoglobin: 8 g/dL — ABNORMAL LOW (ref 13.0–17.0)
Immature Granulocytes: 0 %
Lymphocytes Relative: 22 %
Lymphs Abs: 0.8 10*3/uL (ref 0.7–4.0)
MCH: 29.7 pg (ref 26.0–34.0)
MCHC: 32.3 g/dL (ref 30.0–36.0)
MCV: 92.2 fL (ref 80.0–100.0)
Monocytes Absolute: 0.2 10*3/uL (ref 0.1–1.0)
Monocytes Relative: 5 %
Neutro Abs: 2.4 10*3/uL (ref 1.7–7.7)
Neutrophils Relative %: 65 %
Platelets: 204 10*3/uL (ref 150–400)
RBC: 2.69 MIL/uL — ABNORMAL LOW (ref 4.22–5.81)
RDW: 13.3 % (ref 11.5–15.5)
WBC: 3.7 10*3/uL — ABNORMAL LOW (ref 4.0–10.5)
nRBC: 0 % (ref 0.0–0.2)

## 2021-03-31 LAB — COMPREHENSIVE METABOLIC PANEL
ALT: 32 U/L (ref 0–44)
AST: 171 U/L — ABNORMAL HIGH (ref 15–41)
Albumin: 2.9 g/dL — ABNORMAL LOW (ref 3.5–5.0)
Alkaline Phosphatase: 99 U/L (ref 38–126)
Anion gap: 10 (ref 5–15)
BUN: 51 mg/dL — ABNORMAL HIGH (ref 8–23)
CO2: 22 mmol/L (ref 22–32)
Calcium: 8.5 mg/dL — ABNORMAL LOW (ref 8.9–10.3)
Chloride: 104 mmol/L (ref 98–111)
Creatinine, Ser: 1.27 mg/dL — ABNORMAL HIGH (ref 0.61–1.24)
GFR, Estimated: 58 mL/min — ABNORMAL LOW (ref >60)
Glucose, Bld: 225 mg/dL — ABNORMAL HIGH (ref 70–99)
Potassium: 4.6 mmol/L (ref 3.5–5.1)
Sodium: 136 mmol/L (ref 135–145)
Total Bilirubin: 0.4 mg/dL (ref 0.3–1.2)
Total Protein: 7 g/dL (ref 6.5–8.1)

## 2021-03-31 LAB — LIPASE, BLOOD: Lipase: 34 U/L (ref 11–51)

## 2021-03-31 MED ORDER — LACTATED RINGERS IV BOLUS
1000.0000 mL | Freq: Once | INTRAVENOUS | Status: AC
  Administered 2021-03-31: 1000 mL via INTRAVENOUS

## 2021-03-31 MED ORDER — LOPERAMIDE HCL 2 MG PO CAPS: 2.0000 mg | ORAL_CAPSULE | ORAL | 0 refills | Status: DC | PRN

## 2021-03-31 NOTE — ED Provider Notes (Signed)
[facility] Emergency Department Provider Note   ____________________________________________   Event Date/Time   First MD Initiated Contact with Patient 03/31/21 1220     (approximate)  I have reviewed the triage vital signs and the nursing notes.   HISTORY  Chief Complaint Diarrhea    HPI Nathan Singleton is a 80 y.o. male with past medical history of hypertension, hyperlipidemia, diabetes, CAD, PVD, COPD, and CKD who presents to the ED complaining of diarrhea.  Patient reports onset of diarrhea overnight with 7-10 episodes of diarrhea since last night.  He denies any associated abdominal pain, fever, nausea, or vomiting.  He has not noticed any blood in his stool.  He is currently receiving Ancef through right upper extremity PICC line for MSSA bacteremia associated with right great toe osteomyelitis.  He has been told the foot is healing well and he has been receiving antibiotics without issue.  He denies any increasing pain in his right foot.        Past Medical History:  Diagnosis Date  . Aneurysm (arteriovenous) of coronary vessels   . Arthritis   . Ascending aortic aneurysm ([place])   . CAD (coronary artery disease)   . Diabetes mellitus without complication ([place])   . History of GI bleed   . Hyperlipidemia associated with type 2 diabetes mellitus ([place])   . Hypertension associated with diabetes ([place])   . Presence of permanent cardiac pacemaker   . PVD (peripheral vascular disease) ([place])   . S/P TAVR (transcatheter aortic valve replacement)     Patient Active Problem List   Diagnosis Date Noted  . 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[place])   . MSSA bacteremia   . Anemia of chronic disease   . Cellulitis of great toe, right 03/20/2021  . Type II diabetes mellitus with renal manifestations ([place]) 03/20/2021  . CKD stage 3 due to type 2 diabetes mellitus ([place]) 03/20/2021  . Dizziness 03/20/2021  . Chronic pain syndrome 03/03/2021  . Pharmacologic therapy  03/03/2021  . Disorder of skeletal system 03/03/2021  . Problems influencing health status 03/03/2021  . 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 Chronic lower extremity pain (2ry area of Pain) (Bilateral) (R>L) 03/03/2021  . Chronic anticoagulation (Plavix) 03/03/2021  . Chronic shoulder pain (3ry area of Pain) (Left) 03/03/2021  . 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 Cervical facet hypertrophy (Multilevel) (Bilateral) 03/03/2021  . DDD (degenerative disc disease), cervical 03/03/2021  . BPH (benign prostatic hyperplasia) 02/14/2021  . Diabetic foot ulcer ([place]) 02/13/2021  . Normocytic anemia 11/26/2020  . Chronic kidney disease (CKD) 11/26/2020  . Claudication ([place]) 11/26/2020  . Contusion of right thigh 11/26/2020  . COPD (chronic obstructive pulmonary disease) ([place]) 11/26/2020  . Depression 11/26/2020  . Dyspnea 11/26/2020  . Erectile dysfunction 11/26/2020  . GERD without esophagitis 11/26/2020  . Gout 11/26/2020  . Hyperlipidemia, acquired 11/26/2020  . Hyperlipidemia 11/26/2020  . Myocardial infarction ([place]) 11/26/2020  . Personal history of other diseases of urinary system 11/26/2020  . Right leg swelling 11/26/2020  . Sleep apnea 11/26/2020  . Varicose veins 11/26/2020  . Venous insufficiency 11/26/2020  . Vitamin D deficiency 11/26/2020  . Chest pain 11/21/2020  . Thrombocytopenia ([place]) 11/21/2020  . S/P TAVR (transcatheter aortic valve replacement)   . PVD (peripheral vascular disease) ([place])   . Presence of permanent cardiac pacemaker   . Hypertension associated with diabetes ([place])   . Hyperlipidemia associated with  type 2 diabetes mellitus ([place])   . History of GI bleed   . CAD (coronary artery disease)   . 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 Pacemaker St Jude device 11/19/2020  . Peripheral vascular disease,  unspecified ([place]) stents to popliteal arteries many years ago 11/19/2020  . History of gingival bleeding 11/19/2020  . Coronary artery disease involving coronary bypass graft of native heart with angina pectoris ([place]) 11/19/2020  . 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 Hypertension   . DM2 (diabetes mellitus, type 2) ([place])   . Arthritis   . Closed nondisplaced fracture of greater trochanter of right femur ([place]) 01/25/2020  . DDD (degenerative disc disease), lumbar 12/10/2019  . Chronic low back pain (1ry area of Pain) (Bilateral) (L>R) w/o sciatica 12/09/2019  . 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 Urinary retention 12/09/2019  . Hearing loss 05/11/2019  . Obesity 04/07/2019  . 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 Bipolar disorder ([place]) 01/01/2019  . 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 Heart block 04/25/2018  . Calculus of gallbladder with acute cholecystitis without obstruction 09/21/2017  . Ulcer of jejunum 03/23/2014  . Insomnia 03/22/2014  . Blisters of multiple sites 01/18/2014  . Cellulitis 01/18/2014  . OA (osteoarthritis) of knee 06/09/2013  . Status post percutaneous transluminal coronary angioplasty 01/14/2011    Past Surgical History:  Procedure Laterality Date  . AMPUTATION TOE Right 03/20/2021   Procedure: AMPUTATION TOE;  Surgeon: Sharlotte Alamo, DPM;  Location: [facility] ORS;  Service: Podiatry;  Laterality: Right;  . Ankle repaired Right   . Carpal Tunnel repaired Left   . CATARACT EXTRACTION Bilateral   . COLONOSCOPY    . Copillar implant     . DG CHOLECYSTOGRAPHY GALL BLADDER ([place] HX)    . GASTRIC BYPASS    . heart valve replaced    . IRRIGATION AND DEBRIDEMENT FOOT Right 02/18/2021   Procedure: IRRIGATION AND DEBRIDEMENT FOOT-Right Great Toe;  Surgeon: [name], DPM;  Location: [facility] ORS;  Service: Podiatry;  Laterality: Right;  . LOWER EXTREMITY ANGIOGRAPHY  Right 02/17/2021   Procedure: Lower Extremity Angiography;  Surgeon: Algernon Huxley, MD;  Location: [facility];  Service: Cardiovascular;  Laterality: Right;  . LOWER EXTREMITY ANGIOGRAPHY Right 03/27/2021   Procedure: Lower Extremity Angiography;  Surgeon: Algernon Huxley, MD;  Location: [facility];  Service: Cardiovascular;  Laterality: Right;  . PACEMAKER GENERATOR CHANGE    . REPLACEMENT TOTAL KNEE BILATERAL    . RTC    . STENT PLACE LEFT URETER ([facility] HX) Right    Right Leg  . TEE WITHOUT CARDIOVERSION N/A 03/26/2021   Procedure: TRANSESOPHAGEAL ECHOCARDIOGRAM (TEE);  Surgeon: Kate Sable, MD;  Location: [facility] ORS;  Service: Cardiovascular;  Laterality: N/A;  . Toe nail removed Bilateral     Prior to Admission medications   Medication Sig Start Date End Date Taking? Authorizing Provider  loperamide (IMODIUM) 2 MG capsule Take 1 capsule (2 mg total) by mouth as needed for diarrhea or loose stools. 03/31/21  Yes Blake Divine, MD  allopurinol (ZYLOPRIM) 100 MG tablet Take 100 mg by mouth daily. 10/19/20   [provider]  amLODipine (NORVASC) 5 MG tablet Take 1 tablet (5 mg total) by mouth daily. 03/28/21   Loletha Grayer, MD  baclofen (LIORESAL) 10 MG tablet Take 10 mg by mouth 2 (two) times  daily. 02/12/21   [provider]  BAYER ASPIRIN EC LOW DOSE 81 MG EC tablet Take 81 mg by mouth daily. 11/11/20   [provider]  carvedilol (COREG) 6.25 MG tablet Take 6.25 mg by mouth at bedtime. 03/11/21   [provider]  ceFAZolin (ANCEF) IVPB Inject 2 g into the vein every 8 (eight) hours for 23 days. Indication: MSSA bacteremia and toe osteomyelitis First Dose: Yes Last Day of Therapy:  04/19/2021 Labs - Once weekly:  CBC/D, CMP, and CRP Method of administration: IV Push Method of administration may be changed at the discretion of home infusion pharmacist based upon assessment of the patient and/or caregiver's ability to self-administer the  medication ordered. 03/28/21 04/20/21  Loletha Grayer, MD  cetirizine (ZYRTEC) 10 MG tablet Take 10 mg by mouth daily. 11/11/20   [provider]  clopidogrel (PLAVIX) 75 MG tablet Take 1 tablet (75 mg total) by mouth daily. 02/22/21   Charlynne Cousins, MD  D-5000 125 MCG (5000 UT) TABS Take 1 tablet by mouth daily. 11/11/20   [provider]  docusate sodium (COLACE) 100 MG capsule Take 200 mg by mouth 2 (two) times daily. 11/11/20   [provider]  furosemide (LASIX) 20 MG tablet Take 20 mg by mouth daily. 10/19/20   [provider]  gabapentin (NEURONTIN) 100 MG capsule Take 100 mg by mouth daily. 01/29/21   [provider]  HYDROcodone-acetaminophen (NORCO) 7.5-325 MG tablet Take 1 tablet by mouth every 6 (six) hours as needed for up to 4 days for severe pain. 03/28/21 04/01/21  Loletha Grayer, MD  insulin glargine (LANTUS) 100 UNIT/ML Solostar Pen Inject 22 Units into the skin at bedtime. 03/28/21   Loletha Grayer, MD  isosorbide mononitrate (IMDUR) 30 MG 24 hr tablet Take 30 mg by mouth daily. 02/12/21   [provider]  LINZESS 145 MCG CAPS capsule Take 145 mcg by mouth daily. 11/11/20   [provider]  multivitamin (ONE-A-DAY MEN'S) TABS tablet Take 1 tablet by mouth daily. 11/11/20   [provider]  pantoprazole (PROTONIX) 40 MG tablet Take 1 tablet (40 mg total) by mouth daily. 02/11/21   [name], MD  rosuvastatin (CRESTOR) 40 MG tablet Take 40 mg by mouth daily. 02/13/21   [provider]  sertraline (ZOLOFT) 100 MG tablet Take 200 mg by mouth daily. 11/11/20   [provider]  tamsulosin (FLOMAX) 0.4 MG CAPS capsule Take 0.4 mg by mouth daily. 11/11/20   [provider]    Allergies Atorvastatin, Celecoxib, Dicyclomine hcl, Doxycycline, and Metformin  Family History  Problem Relation Age of Onset  . Heart disease Mother   . Alzheimer's disease Mother   . Heart disease Father   .  Diabetes Father     Social History Social History   Tobacco Use  . Smoking status: Former Research scientist (life sciences)  . Smokeless tobacco: Never Used  Substance Use Topics  . Alcohol use: Not Currently  . Drug use: Not Currently    Review of Systems  Constitutional: No fever/chills Eyes: No visual changes. ENT: No sore throat. Cardiovascular: Denies chest pain. Respiratory: Denies shortness of breath. Gastrointestinal: No abdominal pain.  No nausea, no vomiting.  Positive for diarrhea.  No constipation. Genitourinary: Negative for dysuria. Musculoskeletal: Negative for back pain. Skin: Negative for rash. Neurological: Negative for headaches, focal weakness or numbness.  ____________________________________________   PHYSICAL EXAM:  VITAL SIGNS: ED Triage Vitals  Enc Vitals Group     BP 03/31/21  1219 (!) 146/62     Pulse Rate 03/31/21 1219 (!) 59     Resp 03/31/21 1219 16     Temp 03/31/21 1219 98.9 F (37.2 C)     Temp Source 03/31/21 1219 Oral     SpO2 03/31/21 1214 98 %     Weight 03/31/21 1221 230 lb (104.3 kg)     Height 03/31/21 1221 6' (1.829 m)     Head Circumference --      Peak Flow --      Pain Score 03/31/21 1220 7     Pain Loc --      Pain Edu? --      Excl. in [place]? --     Constitutional: Alert and oriented. Eyes: Conjunctivae are normal. Head: Atraumatic. Nose: No congestion/rhinnorhea. Mouth/Throat: Mucous membranes are moist. Neck: Normal ROM Cardiovascular: Normal rate, regular rhythm. Grossly normal heart sounds. Respiratory: Normal respiratory effort.  No retractions. Lungs CTAB. Gastrointestinal: Soft and nontender. No distention. Genitourinary: deferred Musculoskeletal: Healing surgical site to right foot with sutures intact, no erythema, warmth, or purulent drainage noted. Neurologic:  Normal speech and language. No gross focal neurologic deficits are appreciated. Skin:  Skin is warm, dry and intact. No rash noted. Psychiatric: Mood and affect are  normal. Speech and behavior are normal.  ____________________________________________   LABS (all labs ordered are listed, but only abnormal results are displayed)  Labs Reviewed  CBC WITH DIFFERENTIAL/PLATELET - Abnormal; Notable for the following components:      Result Value   WBC 3.7 (*)    RBC 2.69 (*)    Hemoglobin 8.0 (*)    HCT 24.8 (*)    All other components within normal limits  COMPREHENSIVE METABOLIC PANEL - Abnormal; Notable for the following components:   Glucose, Bld 225 (*)    BUN 51 (*)    Creatinine, Ser 1.27 (*)    Calcium 8.5 (*)    Albumin 2.9 (*)    AST 171 (*)    GFR, Estimated 58 (*)    All other components within normal limits  GASTROINTESTINAL PANEL BY PCR, STOOL (REPLACES STOOL CULTURE)  C DIFFICILE QUICK SCREEN W PCR REFLEX  LIPASE, BLOOD     PROCEDURES  Procedure(s) performed (including Critical Care):  Procedures   ____________________________________________   INITIAL IMPRESSION / ASSESSMENT AND PLAN / ED COURSE       80 year old male with past medical history of hypertension, hyperlipidemia, diabetes, CKD, CAD, peripheral vascular disease, and COPD who presents to the ED with 7-10 episodes of diarrhea since midnight last night.  He denies any associated abdominal pain and has no tenderness on exam.  Vital signs are reassuring and recent surgical site to his right foot appears to be healing well.  We will hydrate with IV fluids, check labs and stool studies, including testing for C. difficile given his recent antibiotics.  Labs are unremarkable, patient has had no further diarrhea here in the ED.  Given he has been observed for almost 3 hours with no further bowel movements, low suspicion for C. difficile at this time.  He is appropriate for discharge home with PCP follow-up, dressing reapplied to right foot.  Patient counseled to return to the ED for any new or worsening symptoms, patient agrees with plan.       ____________________________________________   FINAL CLINICAL IMPRESSION(S) / ED DIAGNOSES  Final diagnoses:  Diarrhea, unspecified type  Diabetic foot infection [facility])     ED Discharge Orders  Ordered    loperamide (IMODIUM) 2 MG capsule  As needed        03/31/21 1439           Note:  This document was prepared using Dragon voice recognition software and may include unintentional dictation errors.   Blake Divine, MD 03/31/21 1440

## 2021-03-31 NOTE — ED Triage Notes (Signed)
Per EMS report, patient's wife reported patient had 7-10 episodes of diarrhea since midnight last night. Patient was given OTC med for diarrhea without relief. Patient denies any blood or excessive foul odor with the diarrhea. Patient has a PICC in place in right arm. Patient had recent right great toe amputation due to diabetic ulcer and is still on IV antibiotics. Dr. Charna Archer removed surgical dressing. Wound is dry. No redness or discharge noted. Swelling noted to area. Sutures are intact.

## 2021-03-31 NOTE — ED Notes (Signed)
Patient was given a warm blanket and pillow. Patient denies need to have a BM at this time.

## 2021-04-02 ENCOUNTER — Other Ambulatory Visit: Payer: Self-pay | Admitting: Gastroenterology

## 2021-04-07 ENCOUNTER — Telehealth: Payer: Self-pay

## 2021-04-07 NOTE — Telephone Encounter (Signed)
Patient would like to make an appt for the procedure he canceled

## 2021-04-07 NOTE — Telephone Encounter (Signed)
Patient wants to reschedule the procedure he canceled on 03/11/21 Procedure Laterality Anesthesia  ESOPHAGOGASTRODUODENOSCOPY (EGD) WITH PROPOFOL

## 2021-04-08 ENCOUNTER — Telehealth: Payer: Self-pay

## 2021-04-08 ENCOUNTER — Encounter: Payer: Self-pay | Admitting: *Deleted

## 2021-04-08 ENCOUNTER — Other Ambulatory Visit: Payer: Self-pay

## 2021-04-08 DIAGNOSIS — K289 Gastrojejunal ulcer, unspecified as acute or chronic, without hemorrhage or perforation: Secondary | ICD-10-CM

## 2021-04-08 NOTE — Telephone Encounter (Signed)
Patient was notified to stop blood thinner medication 5 days before procedure and to resume the day after per Dr. Lucky Cowboy. Patient's finance' verbalized understanding.

## 2021-04-08 NOTE — Telephone Encounter (Signed)
Returned call to patient. Patient was asleep at the time, so his finance scheduled the procedure. Updated instructions will be mailed out. Clearance has been sent to Dr. Lucky Cowboy.

## 2021-04-08 NOTE — Telephone Encounter (Signed)
-----   Message from Algernon Huxley, MD sent at 04/08/2021  2:14 PM EDT ----- Can stop five days before procedure and resume the day after. ----- Message ----- From: Storm Frisk, CMA Sent: 04/08/2021   9:29 AM EDT To: Algernon Huxley, MD

## 2021-04-14 ENCOUNTER — Telehealth: Payer: Self-pay

## 2021-04-14 ENCOUNTER — Telehealth: Payer: Self-pay | Admitting: Pain Medicine

## 2021-04-14 NOTE — Telephone Encounter (Signed)
Dr. Dossie Arbour never ordered an CT. Patient wife called and informed. She states she has probably gotten it mixed up with an x-ray he had done by another Dr. In January. Patient to discuss shoulder issues on May 10th at his appointment here.

## 2021-04-14 NOTE — Telephone Encounter (Signed)
Pt is requesting to have a CT done, Please follow up with the patient.

## 2021-04-14 NOTE — Telephone Encounter (Signed)
Message left asking patient to discuss with Dr. Dossie Arbour at appt on 04-22-21.

## 2021-04-22 ENCOUNTER — Ambulatory Visit: Payer: 59 | Attending: Pain Medicine | Admitting: Pain Medicine

## 2021-04-22 ENCOUNTER — Encounter: Payer: Self-pay | Admitting: Pain Medicine

## 2021-04-22 ENCOUNTER — Other Ambulatory Visit: Payer: Self-pay

## 2021-04-22 VITALS — BP 154/69 | HR 71 | Temp 97.0°F | Resp 16 | Ht 72.0 in | Wt 230.0 lb

## 2021-04-22 DIAGNOSIS — M5136 Other intervertebral disc degeneration, lumbar region: Secondary | ICD-10-CM | POA: Diagnosis present

## 2021-04-22 DIAGNOSIS — G8929 Other chronic pain: Secondary | ICD-10-CM | POA: Diagnosis present

## 2021-04-22 DIAGNOSIS — M25512 Pain in left shoulder: Secondary | ICD-10-CM | POA: Diagnosis present

## 2021-04-22 DIAGNOSIS — M47817 Spondylosis without myelopathy or radiculopathy, lumbosacral region: Secondary | ICD-10-CM | POA: Insufficient documentation

## 2021-04-22 DIAGNOSIS — M47816 Spondylosis without myelopathy or radiculopathy, lumbar region: Secondary | ICD-10-CM | POA: Insufficient documentation

## 2021-04-22 DIAGNOSIS — M545 Low back pain, unspecified: Secondary | ICD-10-CM | POA: Diagnosis present

## 2021-04-22 DIAGNOSIS — Z7901 Long term (current) use of anticoagulants: Secondary | ICD-10-CM | POA: Diagnosis present

## 2021-04-22 NOTE — Progress Notes (Signed)
PROVIDER NOTE: Information contained herein reflects review and annotations entered in association with encounter. Interpretation of such information and data should be left to medically-trained personnel. Information provided to patient can be located elsewhere in the medical record under "Patient Instructions". Document created using STT-dictation technology, any transcriptional errors that may result from process are unintentional.    Patient: Nathan Singleton  Service Category: E/M  Provider: Gaspar Cola, MD  DOB: Apr 02, 1941  DOS: 04/22/2021  Specialty: Interventional Pain Management  MRN: [id number]  Setting: Ambulatory outpatient  PCP: Ranae Plumber, PA  Type: Established Patient    Referring Provider: Ranae Plumber, PA  Location: Office  Delivery: Face-to-face     Primary Reason(s) for Visit: Encounter for evaluation before starting new chronic pain management plan of care (Level of risk: moderate) CC: Back Pain (low)  HPI  Nathan Singleton is a 80 y.o. year old, male patient, who comes today for a follow-up evaluation to review the test results and decide on a treatment plan. 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; Pacemaker St Jude device; Peripheral vascular disease, unspecified ([place]) stents to popliteal arteries many years ago; History of gingival bleeding; Coronary artery disease involving coronary bypass graft of native heart with angina pectoris ([place]); 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; Chest pain; S/P TAVR (transcatheter aortic valve replacement); PVD (peripheral vascular disease) ([place]); Presence of permanent cardiac pacemaker; Hypertension associated with diabetes ([place]); Hyperlipidemia associated with type 2 diabetes mellitus ([place]); History of GI bleed; CAD (coronary artery disease); Thrombocytopenia ([place]); Normocytic anemia; Blisters of  multiple sites; Calculus of gallbladder with acute cholecystitis without obstruction; Cellulitis; Chronic kidney disease (CKD); Chronic low back pain (1ry area of Pain) (Bilateral) (L>R) w/o sciatica; Claudication ([place]); Closed nondisplaced fracture of greater trochanter of right femur ([place]); Contusion of right thigh; COPD (chronic obstructive pulmonary disease) ([place]); DDD (degenerative disc disease), lumbar; Deafness, left; Bipolar disorder ([place]); Depression; Dyspnea; Erectile dysfunction; GERD without esophagitis; Gout; Hearing loss; Heart block; Hyperlipidemia, acquired; Hyperlipidemia; Insomnia; Left asymmetrical SNHL; Myocardial infarction ([place]); Nonrheumatic aortic valve stenosis; OA (osteoarthritis) of knee; Obesity; Personal history of other diseases of urinary system; Retinopathy; Right leg swelling; Sleep apnea; Status post percutaneous transluminal coronary angioplasty; Tietze's syndrome; Gastrojejunal ulcer; Urinary retention; Varicose veins; Venous insufficiency; Vitamin D deficiency; Diabetic foot ulcer (HCC); BPH (benign prostatic hyperplasia); Chronic pain syndrome; Pharmacologic therapy; Disorder of skeletal system; Problems influencing health status; 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[place]); Chronic lower extremity pain (2ry area of Pain) (Bilateral) (R>L); Chronic anticoagulation (Plavix); Chronic shoulder pain (3ry area of Pain) (Left); History of total knee replacement (Bilateral); DDD (degenerative disc disease), thoracic; Cervical facet hypertrophy (Multilevel) (Bilateral); DDD (degenerative disc disease), cervical; Cellulitis of great toe, right; Type II diabetes mellitus with renal manifestations ([place]); CKD stage 3 due to type 2 diabetes mellitus ([place]); Dizziness; MSSA bacteremia; Anemia of chronic disease; 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[place]); 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 His primarily  concern today is the Back Pain (low)  Pain Assessment: Location: Lower Back Radiating: radiates Onset: More than a month ago Duration: Chronic pain Quality: Aching,Sharp,Throbbing Severity: 7 /10 (subjective, self-reported pain score)  Effect on ADL: limits activities Timing: Constant Modifying factors: meds BP: (!) 154/69  HR: 71  Nathan Singleton comes in today for a follow-up visit after his initial evaluation on 04/14/2021. Today  we went over the results of his tests. These were explained in "Layman's terms". During today's appointment we went over my diagnostic impression, as well as the proposed treatment plan.  Precharting review: According to the patient he recently moved from [place] state to [place].  He was being seen at the [facility] in [place], [place].  According to the patient the primary area of pain inside of the lower back (Bilateral) (L>R).  He denies any back surgeries but does admit to having had nerve blocks and all other interventional therapies which apparently were the on at the [facility] wellness center.  Initial scanning of the Internet for the location showed practice this primarily associated with orthopedic surgeons.  The patient secondary area of pain is that of the lower extremities (Bilateral) (R>L).  He indicates that the lower extremity pain was improved with revascularization of the lower extremity.  He is currently on Plavix + ASA 81 mg.  He also has a history of a bilateral total knee replacement.  Other medical problems include having a pacemaker, having had surgery for a replacement of a heart valve, having a cochlear implant and still being very hard of hearing.  More recently he had a fall this past winter where he landed on his left shoulder and since he has not been able to achieve full range of motion and continues to have chronic pain in the left shoulder.  X-rays were negative for any acute fractures.  Orders entered on initial  evaluation: Lab      Compliance Drug Analysis, Ur     Comp. Metabolic Panel (12)     Magnesium     Vitamin B12     Sedimentation rate     25-Hydroxy vitamin D Lcms D2+D3     C-reactive protein     Protime-INR     APTT     Platelet count     Platelet function assay  Imaging      DG Lumbar Spine Complete W/Bend     CT SHOULDER LEFT WO CONTRAST  The patient indicates having been unable to get the x-rays/CT of the shoulder.  Apparently he ended up being hospitalized.  However, review of the x-rays of the lumbar spine do confirm that he has lumbosacral facet hypertrophy affecting primarily the left side, which goes along with the pain that he has been experiencing.  Today we have offered to bring him in for a diagnostic left lumbar facet block.  If this provides him with good relief of the pain, he may be a good candidate for radiofrequency ablation.  In considering the treatment plan options, Nathan Singleton was reminded that I no longer take patients for medication management only. I asked him to let me know if he had no intention of taking advantage of the interventional therapies, so that we could make arrangements to provide this space to someone interested. I also made it clear that undergoing interventional therapies for the purpose of getting pain medications is very inappropriate on the part of a patient, and it will not be tolerated in this practice. This type of behavior would suggest true addiction and therefore it requires referral to an addiction specialist.   Further details on both, my assessment(s), as well as the proposed treatment plan, please see below.  Controlled Substance Pharmacotherapy Assessment REMS (Risk Evaluation and Mitigation Strategy)  Analgesic: Hydrocodone/APAP 10/325 1 tab p.o. 3 times daily (30 mg/day of hydrocodone) (30 MME) MME/day: 30 mg/day  Pill Count: None expected due to no prior prescriptions written by our practice. Dewayne Shorter, RN  04/22/2021  2:40  PM  Signed Safety precautions to be maintained throughout the outpatient stay will include: orient to surroundings, keep bed in low position, maintain call bell within reach at all times, provide assistance with transfer out of bed and ambulation.   Pharmacokinetics: Liberation and absorption (onset of action): WNL Distribution (time to peak effect): WNL Metabolism and excretion (duration of action): WNL         Pharmacodynamics: Desired effects: Analgesia: Nathan Singleton reports >50% benefit. Functional ability: Patient reports that medication allows him to accomplish basic ADLs Clinically meaningful improvement in function (CMIF): Sustained CMIF goals met Perceived effectiveness: Described as relatively effective, allowing for increase in activities of daily living (ADL) Undesirable effects: Side-effects or Adverse reactions: None reported Monitoring: [place] PMP: PDMP reviewed during this encounter. Online review of the past 62-monthperiod previously conducted. Not applicable at this point since we have not taken over the patient's medication management yet. List of other Serum/Urine Drug Screening Test(s):  No results found for: AMPHSCRSER, BARBSCRSER, BENZOSCRSER, COCAINSCRSER, COCAINSCRNUR, PCPSCRSER, THCSCRSER, THCU, CANNABQUANT, OFort Cal[place] OAshdown PHoffman ENekoosaList of all UDS test(s) done:  Lab Results  Component Value Date   SUMMARY Note 03/03/2021   Last UDS on record: Summary  Date Value Ref Range Status  03/03/2021 Note  Final    Comment:    ==================================================================== Compliance Drug Analysis, Ur ==================================================================== Test                             Result       Flag       Units  Drug Present   Hydrocodone                    421                     ng/mg creat   Hydromorphone                  177                     ng/mg creat   Norhydrocodone                 995                      ng/mg creat    Sources of hydrocodone include scheduled prescription medications.    Hydromorphone and norhydrocodone are expected metabolites of    hydrocodone. Hydromorphone is also available as a scheduled    prescription medication.    Gabapentin                     PRESENT   Baclofen                       PRESENT   Sertraline                     PRESENT   Desmethylsertraline            PRESENT    Desmethylsertraline is an expected metabolite of sertraline.    Acetaminophen                  PRESENT   Doxylamine  PRESENT   Dextrorphan/Levorphanol        PRESENT    Dextrorphan is an expected metabolite of dextromethorphan, an over-    the-counter or prescription cough suppressant. Levorphanol is a    scheduled prescription medication. Dextrorphan cannot be    distinguished from levorphanol by the method used for analysis.  ==================================================================== Test                      Result    Flag   Units      Ref Range   Creatinine              61               mg/dL      >=20 ==================================================================== Declared Medications:  Medication list was not provided. ==================================================================== For clinical consultation, please call [phone]. ====================================================================    UDS interpretation: No unexpected findings.          Medication Assessment Form: Patient introduced to form today Treatment compliance: Treatment may start today if patient agrees with proposed plan. Evaluation of compliance is not applicable at this point Risk Assessment Profile: Aberrant behavior: See initial evaluations. None observed or detected today Comorbid factors increasing risk of overdose: See initial evaluation. No additional risks detected today Opioid risk tool (ORT):  Opioid Risk  03/03/2021  Alcohol 3  Illegal  Drugs 4  Rx Drugs 0  Age between 16-45 years  0  History of Preadolescent Sexual Abuse 0  Psychological Disease 2  Bipolar Positive  Depression 1  Opioid Risk Tool Scoring 10  Opioid Risk Interpretation High Risk    ORT Scoring interpretation table:  Score <3 = Low Risk for SUD  Score between 4-7 = Moderate Risk for SUD  Score >8 = High Risk for Opioid Abuse   Risk of substance use disorder (SUD): Low  Risk Mitigation Strategies:  Patient opioid safety counseling: Completed today. Counseling provided to patient as per "Patient Counseling Document". Document signed by patient, attesting to counseling and understanding Patient-Prescriber Agreement (PPA): Obtained today.  Controlled substance notification to other providers: Written and sent today.  Pharmacologic Plan: Today we may be taking over the patient's pharmacological regimen. See below.             Laboratory Chemistry Profile   Renal Lab Results  Component Value Date   BUN 51 (H) 03/31/2021   CREATININE 1.27 (H) 03/31/2021   BCR 31 (H) 03/03/2021   GFRAA 76 11/19/2020   GFRNONAA 58 (L) 03/31/2021   PROTEINUR NEGATIVE 02/13/2021     Electrolytes Lab Results  Component Value Date   NA 136 03/31/2021   K 4.6 03/31/2021   CL 104 03/31/2021   CALCIUM 8.5 (L) 03/31/2021   MG 2.2 03/24/2021     Hepatic Lab Results  Component Value Date   AST 171 (H) 03/31/2021   ALT 32 03/31/2021   ALBUMIN 2.9 (L) 03/31/2021   ALKPHOS 99 03/31/2021   LIPASE 34 03/31/2021     ID Lab Results  Component Value Date   SARSCOV2NAA NEGATIVE 03/20/2021     Bone Lab Results  Component Value Date   25OHVITD1 32 03/03/2021   25OHVITD2 1.0 03/03/2021   25OHVITD3 31 03/03/2021     Endocrine Lab Results  Component Value Date   GLUCOSE 225 (H) 03/31/2021   GLUCOSEU NEGATIVE 02/13/2021   HGBA1C 7.8 (H) 02/19/2021   TSH 3.740 01/13/2021   FREET4 0.74 01/13/2021  Neuropathy Lab Results  Component Value Date    VITAMINB12 1,349 (H) 03/03/2021   HGBA1C 7.8 (H) 02/19/2021     CNS No results found for: COLORCSF, APPEARCSF, RBCCOUNTCSF, WBCCSF, POLYSCSF, LYMPHSCSF, EOSCSF, PROTEINCSF, GLUCCSF, JCVIRUS, CSFOLI, IGGCSF, LABACHR, ACETBL, LABACHR, ACETBL   Inflammation (CRP: Acute  ESR: Chronic) Lab Results  Component Value Date   CRP 11.6 (H) 03/20/2021   ESRSEDRATE 70 (H) 03/20/2021   LATICACIDVEN 0.9 03/20/2021     Rheumatology No results found for: RF, ANA, LABURIC, URICUR, LYMEIGGIGMAB, LYMEABIGMQN, HLAB27   Coagulation Lab Results  Component Value Date   INR 1.2 03/20/2021   LABPROT 15.1 03/20/2021   APTT 30 03/03/2021   PLT 204 03/31/2021     Cardiovascular Lab Results  Component Value Date   BNP 155.6 (H) 03/20/2021   HGB 8.0 (L) 03/31/2021   HCT 24.8 (L) 03/31/2021     Screening Lab Results  Component Value Date   SARSCOV2NAA NEGATIVE 03/20/2021     Cancer No results found for: CEA, CA125, LABCA2   Allergens No results found for: ALMOND, APPLE, ASPARAGUS, AVOCADO, BANANA, BARLEY, BASIL, BAYLEAF, GREENBEAN, LIMABEAN, WHITEBEAN, BEEFIGE, REDBEET, BLUEBERRY, BROCCOLI, CABBAGE, MELON, CARROT, CASEIN, CASHEWNUT, CAULIFLOWER, CELERY     Note: Lab results reviewed.  Recent Diagnostic Imaging Review  Cervical Imaging: Cervical CT wo contrast: Results for orders placed during the hospital encounter of 12/31/20 CT Cervical Spine Wo Contrast  Narrative CLINICAL DATA:  Status post fall.  EXAM: CT CERVICAL SPINE WITHOUT CONTRAST  TECHNIQUE: Multidetector CT imaging of the cervical spine was performed without intravenous contrast. Multiplanar CT image reconstructions were also generated.  COMPARISON:  None.  FINDINGS: Alignment: Normal.  Skull base and vertebrae: No acute fracture. Chronic changes are seen involving the tip of the dens. No primary bone lesion or focal pathologic process.  Soft tissues and spinal canal: No prevertebral fluid or swelling. No visible  canal hematoma.  Disc levels: Moderate to marked severity endplate sclerosis and anterior osteophyte formation is seen at the levels of C2-C3, C3-C4, C4-C5, C5-C6, C6-C7 and C7-T1.  Marked severity intervertebral disc space narrowing is seen at the level of C4-C5. Moderate severity intervertebral disc space narrowing is seen throughout the remainder of the cervical spine.  Moderate severity bilateral multilevel facet joint hypertrophy is noted.  Upper chest: Negative.  Other: None.  IMPRESSION: 1. Moderate to marked severity degenerative changes of the cervical spine, as described above. 2. No evidence of an acute fracture or subluxation.   Electronically Signed By: Virgina Norfolk M.D. On: 12/31/2020 18:25  Shoulder Imaging: Shoulder-L DG: Results for orders placed during the hospital encounter of 12/31/20 DG Shoulder Left  Narrative CLINICAL DATA:  Left shoulder pain after fall.  EXAM: LEFT SHOULDER - 2+ VIEW  COMPARISON:  September 06, 2020.  FINDINGS: There is no evidence of fracture or dislocation. Mild degenerative changes seen involving the left acromioclavicular joint. Calcification is seen over the humeral head suggesting calcific tendinosis. Soft tissues are unremarkable.  IMPRESSION: No acute abnormality seen in the left shoulder. Probable calcific tendinosis of rotator cuff.   Electronically Signed By: Marijo Conception M.D. On: 12/31/2020 14:56  Thoracic Imaging: Thoracic DG 2-3 views: Results for orders placed during the hospital encounter of 10/07/20 DG Thoracic Spine 2 View  Narrative CLINICAL DATA:  Status post fall.  EXAM: THORACIC SPINE 2 VIEWS  COMPARISON:  None.  FINDINGS: There is no evidence of an acute thoracic spine fracture. Alignment is normal. There is moderate severity multilevel  endplate sclerosis with mild to moderate severity multilevel intervertebral disc space narrowing. No other significant bone abnormalities are  identified. An artificial aortic valve is seen with radiopaque surgical clips noted within the left upper quadrant.  IMPRESSION: 1. Moderate severity multilevel degenerative disc disease.   Electronically Signed By: Virgina Norfolk M.D. On: 10/07/2020 19:02  Lumbosacral Imaging: Lumbar DG 2-3 views: Results for orders placed during the hospital encounter of 10/07/20 DG Lumbar Spine 2-3 Views  Narrative CLINICAL DATA:  Status post recent fall.  EXAM: LUMBAR SPINE - 2-3 VIEW  COMPARISON:  September 06, 2020  FINDINGS: There is no evidence of an acute lumbar spine fracture. Alignment is normal. There is moderate to marked severity endplate sclerosis and anterior osteophyte formation. Mild multilevel intervertebral disc space narrowing is seen. Radiopaque surgical clips are seen within the bilateral upper quadrants.  IMPRESSION: 1. Moderate to marked severity multilevel degenerative disc disease.   Electronically Signed By: Virgina Norfolk M.D. On: 10/07/2020 19:04  Lumbar DG Bending views: Results for orders placed during the hospital encounter of 03/03/21 DG Lumbar Spine Complete W/Bend  Narrative CLINICAL DATA:  Low back pain with radicular symptoms  EXAM: LUMBAR SPINE - COMPLETE WITH BENDING VIEWS  COMPARISON:  October 07, 2020  FINDINGS: Frontal, neutral lateral, flexion lateral, extension lateral, spot lumbosacral lateral, and bilateral oblique views were obtained. There are 5 non-rib-bearing lumbar type vertebral bodies. There is mild lumbar dextroscoliosis. There is no demonstrable fracture. There is no appreciable spondylolisthesis on neutral lateral imaging. There is no appreciable change in lateral alignment between neutral lateral, flexion lateral, and extension lateral imaging. There is mild disc space narrowing at all levels. There is facet osteoarthritic change at L5-S1 on the left. Other facets appear unremarkable.  IMPRESSION: No  fracture. No spondylolisthesis on neutral lateral imaging. No change in lateral alignment between neutral lateral, flexion lateral, and extension lateral imaging. Disc space narrowing, relatively mild, at all levels. Facet osteoarthritic change noted at L5-S1 on the left.   Electronically Signed By: Lowella Grip III M.D. On: 03/04/2021 14:24  Foot Imaging: Foot-R DG Complete: Results for orders placed during the hospital encounter of 03/20/21 DG Foot Complete Right  Narrative CLINICAL DATA:  80 year old male status post right foot surgery for infection a few days ago. Hypotension, dizziness.  EXAM: RIGHT FOOT COMPLETE - 3+ VIEW  COMPARISON:  Right foot series 02/13/2021.  FINDINGS: Right great toe osteotomy about the IP joint which has been resected. Generalized soft tissue swelling in the right great toe and medial foot. No soft tissue gas. No definite cortical osteolysis along the osteotomy margins. First MTP and other joint spaces appear stable from last month. Calcified peripheral vascular disease. No new osseous abnormality.  IMPRESSION: 1. Interval right great toe osteotomy about the IP joint which was resected. No superimposed plain radiographic evidence of osteomyelitis at this time. 2. Generalized soft tissue swelling. Calcified peripheral vascular disease.   Electronically Signed By: Genevie Ann M.D. On: 03/20/2021 04:45  Complexity Note: Imaging results reviewed. Results shared with Nathan Singleton, using Layman's terms.                        Meds   Current Outpatient Medications:  .  allopurinol (ZYLOPRIM) 100 MG tablet, Take 100 mg by mouth daily., Disp: , Rfl:  .  amLODipine (NORVASC) 5 MG tablet, Take 1 tablet (5 mg total) by mouth daily., Disp: 30 tablet, Rfl: 0 .  baclofen (LIORESAL) 10  MG tablet, Take 10 mg by mouth 2 (two) times daily., Disp: , Rfl:  .  BAYER ASPIRIN EC LOW DOSE 81 MG EC tablet, Take 81 mg by mouth daily., Disp: , Rfl:  .   carvedilol (COREG) 6.25 MG tablet, Take 6.25 mg by mouth at bedtime., Disp: , Rfl:  .  cephALEXin (KEFLEX) 500 MG capsule, Take 500 mg by mouth 4 (four) times daily., Disp: , Rfl:  .  cetirizine (ZYRTEC) 10 MG tablet, Take 10 mg by mouth daily., Disp: , Rfl:  .  D-5000 125 MCG (5000 UT) TABS, Take 1 tablet by mouth daily., Disp: , Rfl:  .  docusate sodium (COLACE) 100 MG capsule, Take 200 mg by mouth 2 (two) times daily., Disp: , Rfl:  .  furosemide (LASIX) 20 MG tablet, Take 20 mg by mouth daily., Disp: , Rfl:  .  gabapentin (NEURONTIN) 100 MG capsule, Take 100 mg by mouth daily., Disp: , Rfl:  .  insulin glargine (LANTUS) 100 UNIT/ML Solostar Pen, Inject 22 Units into the skin at bedtime., Disp: 15 mL, Rfl: 11 .  isosorbide mononitrate (IMDUR) 30 MG 24 hr tablet, Take 30 mg by mouth daily., Disp: , Rfl:  .  LINZESS 145 MCG CAPS capsule, Take 145 mcg by mouth daily., Disp: , Rfl:  .  loperamide (IMODIUM) 2 MG capsule, Take 1 capsule (2 mg total) by mouth as needed for diarrhea or loose stools., Disp: 30 capsule, Rfl: 0 .  multivitamin (ONE-A-DAY MEN'S) TABS tablet, Take 1 tablet by mouth daily., Disp: , Rfl:  .  pantoprazole (PROTONIX) 40 MG tablet, Take 1 tablet (40 mg total) by mouth daily., Disp: 90 tablet, Rfl: 1 .  rosuvastatin (CRESTOR) 40 MG tablet, Take 40 mg by mouth daily., Disp: , Rfl:  .  sertraline (ZOLOFT) 100 MG tablet, Take 200 mg by mouth daily., Disp: , Rfl:  .  tamsulosin (FLOMAX) 0.4 MG CAPS capsule, Take 0.4 mg by mouth daily., Disp: , Rfl:  .  clopidogrel (PLAVIX) 75 MG tablet, Take 1 tablet (75 mg total) by mouth daily. (Patient not taking: Reported on 04/22/2021), Disp: 60 tablet, Rfl: 3  ROS  Constitutional: Denies any fever or chills Gastrointestinal: No reported hemesis, hematochezia, vomiting, or acute GI distress Musculoskeletal: Denies any acute onset joint swelling, redness, loss of ROM, or weakness Neurological: No reported episodes of acute onset apraxia,  aphasia, dysarthria, agnosia, amnesia, paralysis, loss of coordination, or loss of consciousness  Allergies  Nathan Singleton is allergic to atorvastatin, celecoxib, dicyclomine hcl, doxycycline, and metformin.  PFSH  Drug: Nathan Singleton  reports previous drug use. Alcohol:  reports previous alcohol use. Tobacco:  reports that he has quit smoking. He has never used smokeless tobacco. Medical:  has a past medical history of Aneurysm (arteriovenous) of coronary vessels, Arthritis, Ascending aortic aneurysm ([place]), CAD (coronary artery disease), Diabetes mellitus without complication ([place]), History of GI bleed, Hyperlipidemia associated with type 2 diabetes mellitus ([place]), Hypertension associated with diabetes ([place]), Presence of permanent cardiac pacemaker, PVD (peripheral vascular disease) ([place]), and S/P TAVR (transcatheter aortic valve replacement). 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 Family: family history includes Alzheimer's disease in his mother; Diabetes in his father; Heart disease in his father and mother.  Constitutional Exam  General appearance: Well nourished, well developed, and well hydrated. In no apparent acute distress Vitals:   04/22/21 1429  BP: (!) 154/69  Pulse: 71  Resp: 16  Temp: (!) 97 F (36.1 C)  TempSrc: Temporal  SpO2: 100%  Weight: 230 lb (104.3 kg)  Height: 6' (1.829 m)   BMI  Assessment: Estimated body mass index is 31.19 kg/m as calculated from the following:   Height as of this encounter: 6' (1.829 m).   Weight as of this encounter: 230 lb (104.3 kg).  BMI interpretation table: BMI level Category Range association with higher incidence of chronic pain  <18 kg/m2 Underweight   18.5-24.9 kg/m2 Ideal body weight   25-29.9 kg/m2 Overweight Increased incidence by 20%  30-34.9 kg/m2 Obese (Class I) Increased incidence by 68%  35-39.9 kg/m2 Severe obesity (Class II) Increased incidence by 136%  >40 kg/m2 Extreme obesity (Class III) Increased incidence by 254%   Patient's current BMI Ideal Body weight  Body mass index is 31.19 kg/m. Ideal body weight: 77.6 kg (171 lb 1.2 oz) Adjusted ideal body weight: 88.3 kg (194 lb 10.3 oz)   BMI Readings from Last 4 Encounters:  04/22/21 31.19 kg/m  03/31/21 31.19 kg/m  03/26/21 31.45 kg/m  03/19/21 34.18 kg/m   Wt Readings from Last 4 Encounters:  04/22/21 230 lb (104.3 kg)  03/31/21 230 lb (104.3 kg)  03/26/21 231 lb 14.8 oz (105.2 kg)  03/19/21 252 lb (114.3 kg)    Psych/Mental status: Alert, oriented x 3 (person, place, & time)       Eyes: PERLA Respiratory: No evidence of acute respiratory distress  Assessment & Plan  Primary Diagnosis & Pertinent Problem List: The primary encounter diagnosis was 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  Visit Diagnosis: 1. 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 DDD (degenerative disc disease), lumbar   3. 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 Lumbar facet joint syndrome (Bilateral)  (L>R)   5. Chronic anticoagulation (Plavix)   6. 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   Procedure Orders     LUMBAR FACET(MEDIAL BRANCH NERVE BLOCK) MBNB Lab Orders  No laboratory test(s) ordered today    Imaging Orders     DG Shoulder Left Referral Orders  No referral(s) requested today   Pharmacological management options:  Opioid Analgesics: We'll take over management today. See above orders Membrane stabilizer: Options discussed, including a trial. Muscle relaxant: We have discussed the possibility of a trial NSAID: Trial discussed. Other analgesic(s): To be determined at a later time     Interventional Therapies  Risk  Complexity Considerations:   Estimated body mass index is 31.19 kg/m  as calculated from the following:   Height as of this encounter: 6' (1.829 m).   Weight as of this encounter: 230 lb (104.3 kg). NOTE: Plavix Anticoagulation (Stop: 7-10 days  Restart: 2 hours)    Planned  Pending:   Diagnostic bilateral lumbar facet block #1    Under consideration:   Diagnostic bilateral lumbar facet block #1    Completed:   None at this time   Therapeutic  Palliative (PRN) options:   None established    Provider-requested follow-up: Return in about 2 weeks (around 05/06/2021) for Procedure (w/ sedation): (L) L-FCT BLK #1, (Blood Thinner Protocol). Recent Visits Date Type Provider Dept  03/03/21 Office Visit Milinda Pointer, MD [facility]  Showing recent visits within past 90 days and meeting all other requirements Today's Visits Date Type Provider Dept  04/22/21 Office Visit Milinda Pointer, MD [facility]  Showing today's  visits and meeting all other requirements Future Appointments Date Type Provider Dept  05/13/21 Appointment Milinda Pointer, MD [facility]  Showing future appointments within next 90 days and meeting all other requirements  Primary Care Physician: Ranae Plumber, PA Note by: Gaspar Cola, MD Date: 04/22/2021; Time: 7:14 PM

## 2021-04-22 NOTE — Patient Instructions (Signed)
____________________________________________________________________________________________  Preparing for Procedure with Sedation  Procedure appointments are limited to planned procedures: . No Prescription Refills. . No disability issues will be discussed. . No medication changes will be discussed.  Instructions: . Oral Intake: Do not eat or drink anything for at least 8 hours prior to your procedure. (Exception: Blood Pressure Medication. See below.) . Transportation: Unless otherwise stated by your physician, you may drive yourself after the procedure. . Blood Pressure Medicine: Do not forget to take your blood pressure medicine with a sip of water the morning of the procedure. If your Diastolic (lower reading)is above 100 mmHg, elective cases will be cancelled/rescheduled. . Blood thinners: These will need to be stopped for procedures. Notify our staff if you are taking any blood thinners. Depending on which one you take, there will be specific instructions on how and when to stop it. . Diabetics on insulin: Notify the staff so that you can be scheduled 1st case in the morning. If your diabetes requires high dose insulin, take only  of your normal insulin dose the morning of the procedure and notify the staff that you have done so. . Preventing infections: Shower with an antibacterial soap the morning of your procedure. . Build-up your immune system: Take 1000 mg of Vitamin C with every meal (3 times a day) the day prior to your procedure. . Antibiotics: Inform the staff if you have a condition or reason that requires you to take antibiotics before dental procedures. . Pregnancy: If you are pregnant, call and cancel the procedure. . Sickness: If you have a cold, fever, or any active infections, call and cancel the procedure. . Arrival: You must be in the facility at least 30 minutes prior to your scheduled procedure. . Children: Do not bring children with you. . Dress appropriately:  Bring dark clothing that you would not mind if they get stained. . Valuables: Do not bring any jewelry or valuables.  Reasons to call and reschedule or cancel your procedure: (Following these recommendations will minimize the risk of a serious complication.) . Surgeries: Avoid having procedures within 2 weeks of any surgery. (Avoid for 2 weeks before or after any surgery). . Flu Shots: Avoid having procedures within 2 weeks of a flu shots or . (Avoid for 2 weeks before or after immunizations). . Barium: Avoid having a procedure within 7-10 days after having had a radiological study involving the use of radiological contrast. (Myelograms, Barium swallow or enema study). . Heart attacks: Avoid any elective procedures or surgeries for the initial 6 months after a "Myocardial Infarction" (Heart Attack). . Blood thinners: It is imperative that you stop these medications before procedures. Let us know if you if you take any blood thinner.  . Infection: Avoid procedures during or within two weeks of an infection (including chest colds or gastrointestinal problems). Symptoms associated with infections include: Localized redness, fever, chills, night sweats or profuse sweating, burning sensation when voiding, cough, congestion, stuffiness, runny nose, sore throat, diarrhea, nausea, vomiting, cold or Flu symptoms, recent or current infections. It is specially important if the infection is over the area that we intend to treat. . Heart and lung problems: Symptoms that may suggest an active cardiopulmonary problem include: cough, chest pain, breathing difficulties or shortness of breath, dizziness, ankle swelling, uncontrolled high or unusually low blood pressure, and/or palpitations. If you are experiencing any of these symptoms, cancel your procedure and contact your primary care physician for an evaluation.  Remember:  Regular Business hours are:    Monday to Thursday 8:00 AM to 4:00 PM  Provider's  Schedule: Sixto Bowdish, MD:  Procedure days: Tuesday and Thursday 7:30 AM to 4:00 PM  Bilal Lateef, MD:  Procedure days: Monday and Wednesday 7:30 AM to 4:00 PM ____________________________________________________________________________________________   ____________________________________________________________________________________________  General Risks and Possible Complications  Patient Responsibilities: It is important that you read this as it is part of your informed consent. It is our duty to inform you of the risks and possible complications associated with treatments offered to you. It is your responsibility as a patient to read this and to ask questions about anything that is not clear or that you believe was not covered in this document.  Patient's Rights: You have the right to refuse treatment. You also have the right to change your mind, even after initially having agreed to have the treatment done. However, under this last option, if you wait until the last second to change your mind, you may be charged for the materials used up to that point.  Introduction: Medicine is not an exact science. Everything in Medicine, including the lack of treatment(s), carries the potential for danger, harm, or loss (which is by definition: Risk). In Medicine, a complication is a secondary problem, condition, or disease that can aggravate an already existing one. All treatments carry the risk of possible complications. The fact that a side effects or complications occurs, does not imply that the treatment was conducted incorrectly. It must be clearly understood that these can happen even when everything is done following the highest safety standards.  No treatment: You can choose not to proceed with the proposed treatment alternative. The "PRO(s)" would include: avoiding the risk of complications associated with the therapy. The "CON(s)" would include: not getting any of the treatment  benefits. These benefits fall under one of three categories: diagnostic; therapeutic; and/or palliative. Diagnostic benefits include: getting information which can ultimately lead to improvement of the disease or symptom(s). Therapeutic benefits are those associated with the successful treatment of the disease. Finally, palliative benefits are those related to the decrease of the primary symptoms, without necessarily curing the condition (example: decreasing the pain from a flare-up of a chronic condition, such as incurable terminal cancer).  General Risks and Complications: These are associated to most interventional treatments. They can occur alone, or in combination. They fall under one of the following six (6) categories: no benefit or worsening of symptoms; bleeding; infection; nerve damage; allergic reactions; and/or death. 1. No benefits or worsening of symptoms: In Medicine there are no guarantees, only probabilities. No healthcare provider can ever guarantee that a medical treatment will work, they can only state the probability that it may. Furthermore, there is always the possibility that the condition may worsen, either directly, or indirectly, as a consequence of the treatment. 2. Bleeding: This is more common if the patient is taking a blood thinner, either prescription or over the counter (example: Goody Powders, Fish oil, Aspirin, Garlic, etc.), or if suffering a condition associated with impaired coagulation (example: Hemophilia, cirrhosis of the liver, low platelet counts, etc.). However, even if you do not have one on these, it can still happen. If you have any of these conditions, or take one of these drugs, make sure to notify your treating physician. 3. Infection: This is more common in patients with a compromised immune system, either due to disease (example: diabetes, cancer, human immunodeficiency virus [HIV], etc.), or due to medications or treatments (example: therapies used to treat  cancer and   rheumatological diseases). However, even if you do not have one on these, it can still happen. If you have any of these conditions, or take one of these drugs, make sure to notify your treating physician. 4. Nerve Damage: This is more common when the treatment is an invasive one, but it can also happen with the use of medications, such as those used in the treatment of cancer. The damage can occur to small secondary nerves, or to large primary ones, such as those in the spinal cord and brain. This damage may be temporary or permanent and it may lead to impairments that can range from temporary numbness to permanent paralysis and/or brain death. 5. Allergic Reactions: Any time a substance or material comes in contact with our body, there is the possibility of an allergic reaction. These can range from a mild skin rash (contact dermatitis) to a severe systemic reaction (anaphylactic reaction), which can result in death. 6. Death: In general, any medical intervention can result in death, most of the time due to an unforeseen complication. ____________________________________________________________________________________________  ____________________________________________________________________________________________  Blood Thinners  IMPORTANT NOTICE:  If you take any of these, make sure to notify the nursing staff.  Failure to do so may result in injury.  Recommended time intervals to stop and restart blood-thinners, before & after invasive procedures  Generic Name Brand Name Stop Time. Must be stopped at least this long before procedures. After procedures, wait at least this long before re-starting.  Abciximab Reopro 15 days 2 hrs  Alteplase Activase 10 days 10 days  Anagrelide Agrylin    Apixaban Eliquis 3 days 6 hrs  Cilostazol Pletal 3 days 5 hrs  Clopidogrel Plavix 7-10 days 2 hrs  Dabigatran Pradaxa 5 days 6 hrs  Dalteparin Fragmin 24 hours 4 hrs  Dipyridamole Aggrenox  11days 2 hrs  Edoxaban Lixiana; Savaysa 3 days 2 hrs  Enoxaparin  Lovenox 24 hours 4 hrs  Eptifibatide Integrillin 8 hours 2 hrs  Fondaparinux  Arixtra 72 hours 12 hrs  Hydroxychloroquine Plaquenil 11 days   Prasugrel Effient 7-10 days 6 hrs  Reteplase Retavase 10 days 10 days  Rivaroxaban Xarelto 3 days 6 hrs  Ticagrelor Brilinta 5-7 days 6 hrs  Ticlopidine Ticlid 10-14 days 2 hrs  Tinzaparin Innohep 24 hours 4 hrs  Tirofiban Aggrastat 8 hours 2 hrs  Warfarin Coumadin 5 days 2 hrs   Other medications with blood-thinning effects  Product indications Generic (Brand) names Note  Cholesterol Lipitor Stop 4 days before procedure  Blood thinner (injectable) Heparin (LMW or LMWH Heparin) Stop 24 hours before procedure  Cancer Ibrutinib (Imbruvica) Stop 7 days before procedure  Malaria/Rheumatoid Hydroxychloroquine (Plaquenil) Stop 11 days before procedure  Thrombolytics  10 days before or after procedures   Over-the-counter (OTC) Products with blood-thinning effects  Product Common names Stop Time  Aspirin > 325 mg Goody Powders, Excedrin, etc. 11 days  Aspirin ? 81 mg  7 days  Fish oil  4 days  Garlic supplements  7 days  Ginkgo biloba  36 hours  Ginseng  24 hours  NSAIDs Ibuprofen, Naprosyn, etc. 3 days  Vitamin E  4 days   ____________________________________________________________________________________________

## 2021-04-22 NOTE — Progress Notes (Signed)
Safety precautions to be maintained throughout the outpatient stay will include: orient to surroundings, keep bed in low position, maintain call bell within reach at all times, provide assistance with transfer out of bed and ambulation.  

## 2021-04-23 ENCOUNTER — Telehealth: Payer: Self-pay

## 2021-04-23 ENCOUNTER — Ambulatory Visit
Admission: RE | Admit: 2021-04-23 | Discharge: 2021-04-23 | Disposition: A | Discharge status: Home / Self Care | Payer: 59 | Source: Ambulatory Visit | Attending: Pain Medicine | Admitting: Pain Medicine

## 2021-04-23 DIAGNOSIS — G8929 Other chronic pain: Secondary | ICD-10-CM | POA: Insufficient documentation

## 2021-04-23 DIAGNOSIS — M25512 Pain in left shoulder: Secondary | ICD-10-CM

## 2021-04-24 ENCOUNTER — Encounter: Admission: RE | Payer: Self-pay | Source: Home / Self Care

## 2021-04-24 ENCOUNTER — Ambulatory Visit: Admission: RE | Admit: 2021-04-24 | Payer: 59 | Source: Home / Self Care | Admitting: Gastroenterology

## 2021-04-24 SURGERY — ESOPHAGOGASTRODUODENOSCOPY (EGD) WITH PROPOFOL
Anesthesia: General

## 2021-04-30 ENCOUNTER — Telehealth: Payer: Self-pay | Admitting: *Deleted

## 2021-04-30 NOTE — Telephone Encounter (Signed)
Attempted to call patient to inform him that we have clearance from Dr. Lucky Cowboy to stop Plavix 7 days for facet block. Message left at home number informing him to do so. Also attempted to call mobile number, no answer, unable to leave a message.

## 2021-05-06 ENCOUNTER — Emergency Department: Payer: 59

## 2021-05-06 ENCOUNTER — Inpatient Hospital Stay: Payer: 59

## 2021-05-06 ENCOUNTER — Inpatient Hospital Stay
Admission: EM | Admit: 2021-05-06 | Discharge: 2021-05-09 | DRG: 264 | Disposition: A | Discharge status: Skilled Nursing Facility | Payer: 59 | Attending: Internal Medicine | Admitting: Internal Medicine

## 2021-05-06 ENCOUNTER — Other Ambulatory Visit: Payer: Self-pay

## 2021-05-06 DIAGNOSIS — D638 Anemia in other chronic diseases classified elsewhere: Secondary | ICD-10-CM | POA: Diagnosis present

## 2021-05-06 DIAGNOSIS — J449 Chronic obstructive pulmonary disease, unspecified: Secondary | ICD-10-CM

## 2021-05-06 DIAGNOSIS — Z951 Presence of aortocoronary bypass graft: Secondary | ICD-10-CM

## 2021-05-06 DIAGNOSIS — Z952 Presence of prosthetic heart valve: Secondary | ICD-10-CM

## 2021-05-06 DIAGNOSIS — Z794 Long term (current) use of insulin: Secondary | ICD-10-CM

## 2021-05-06 DIAGNOSIS — E1122 Type 2 diabetes mellitus with diabetic chronic kidney disease: Secondary | ICD-10-CM | POA: Diagnosis present

## 2021-05-06 DIAGNOSIS — Z8249 Family history of ischemic heart disease and other diseases of the circulatory system: Secondary | ICD-10-CM

## 2021-05-06 DIAGNOSIS — D631 Anemia in chronic kidney disease: Secondary | ICD-10-CM | POA: Diagnosis present

## 2021-05-06 DIAGNOSIS — K529 Noninfective gastroenteritis and colitis, unspecified: Secondary | ICD-10-CM | POA: Diagnosis present

## 2021-05-06 DIAGNOSIS — N1831 Chronic kidney disease, stage 3a: Secondary | ICD-10-CM | POA: Diagnosis present

## 2021-05-06 DIAGNOSIS — R197 Diarrhea, unspecified: Secondary | ICD-10-CM | POA: Diagnosis present

## 2021-05-06 DIAGNOSIS — Z66 Do not resuscitate: Secondary | ICD-10-CM | POA: Diagnosis present

## 2021-05-06 DIAGNOSIS — I2510 Atherosclerotic heart disease of native coronary artery without angina pectoris: Secondary | ICD-10-CM | POA: Diagnosis present

## 2021-05-06 DIAGNOSIS — Z820 Family history of epilepsy and other diseases of the nervous system: Secondary | ICD-10-CM

## 2021-05-06 DIAGNOSIS — Z79899 Other long term (current) drug therapy: Secondary | ICD-10-CM

## 2021-05-06 DIAGNOSIS — E1151 Type 2 diabetes mellitus with diabetic peripheral angiopathy without gangrene: Secondary | ICD-10-CM | POA: Diagnosis present

## 2021-05-06 DIAGNOSIS — F32A Depression, unspecified: Secondary | ICD-10-CM | POA: Diagnosis not present

## 2021-05-06 DIAGNOSIS — Z20822 Contact with and (suspected) exposure to covid-19: Secondary | ICD-10-CM | POA: Diagnosis present

## 2021-05-06 DIAGNOSIS — R531 Weakness: Secondary | ICD-10-CM | POA: Diagnosis not present

## 2021-05-06 DIAGNOSIS — F319 Bipolar disorder, unspecified: Secondary | ICD-10-CM | POA: Diagnosis present

## 2021-05-06 DIAGNOSIS — I10 Essential (primary) hypertension: Secondary | ICD-10-CM | POA: Diagnosis present

## 2021-05-06 DIAGNOSIS — E785 Hyperlipidemia, unspecified: Secondary | ICD-10-CM | POA: Diagnosis present

## 2021-05-06 DIAGNOSIS — I495 Sick sinus syndrome: Secondary | ICD-10-CM | POA: Diagnosis present

## 2021-05-06 DIAGNOSIS — N400 Enlarged prostate without lower urinary tract symptoms: Secondary | ICD-10-CM | POA: Diagnosis present

## 2021-05-06 DIAGNOSIS — Z950 Presence of cardiac pacemaker: Secondary | ICD-10-CM

## 2021-05-06 DIAGNOSIS — I712 Thoracic aortic aneurysm, without rupture: Secondary | ICD-10-CM | POA: Diagnosis present

## 2021-05-06 DIAGNOSIS — Z89411 Acquired absence of right great toe: Secondary | ICD-10-CM

## 2021-05-06 DIAGNOSIS — I951 Orthostatic hypotension: Principal | ICD-10-CM

## 2021-05-06 DIAGNOSIS — N183 Chronic kidney disease, stage 3 unspecified: Secondary | ICD-10-CM | POA: Diagnosis present

## 2021-05-06 DIAGNOSIS — Z8711 Personal history of peptic ulcer disease: Secondary | ICD-10-CM

## 2021-05-06 DIAGNOSIS — D696 Thrombocytopenia, unspecified: Secondary | ICD-10-CM | POA: Diagnosis present

## 2021-05-06 DIAGNOSIS — J440 Chronic obstructive pulmonary disease with acute lower respiratory infection: Secondary | ICD-10-CM | POA: Diagnosis present

## 2021-05-06 DIAGNOSIS — E1169 Type 2 diabetes mellitus with other specified complication: Secondary | ICD-10-CM | POA: Diagnosis present

## 2021-05-06 DIAGNOSIS — Z881 Allergy status to other antibiotic agents status: Secondary | ICD-10-CM

## 2021-05-06 DIAGNOSIS — J189 Pneumonia, unspecified organism: Secondary | ICD-10-CM | POA: Diagnosis present

## 2021-05-06 DIAGNOSIS — B999 Unspecified infectious disease: Secondary | ICD-10-CM

## 2021-05-06 DIAGNOSIS — L03031 Cellulitis of right toe: Secondary | ICD-10-CM | POA: Diagnosis present

## 2021-05-06 DIAGNOSIS — R55 Syncope and collapse: Secondary | ICD-10-CM

## 2021-05-06 DIAGNOSIS — Z833 Family history of diabetes mellitus: Secondary | ICD-10-CM

## 2021-05-06 DIAGNOSIS — E11621 Type 2 diabetes mellitus with foot ulcer: Secondary | ICD-10-CM | POA: Diagnosis present

## 2021-05-06 DIAGNOSIS — Z888 Allergy status to other drugs, medicaments and biological substances status: Secondary | ICD-10-CM

## 2021-05-06 DIAGNOSIS — Z7982 Long term (current) use of aspirin: Secondary | ICD-10-CM

## 2021-05-06 DIAGNOSIS — Z96653 Presence of artificial knee joint, bilateral: Secondary | ICD-10-CM | POA: Diagnosis present

## 2021-05-06 DIAGNOSIS — I2541 Coronary artery aneurysm: Secondary | ICD-10-CM | POA: Diagnosis present

## 2021-05-06 DIAGNOSIS — M1990 Unspecified osteoarthritis, unspecified site: Secondary | ICD-10-CM | POA: Diagnosis present

## 2021-05-06 DIAGNOSIS — E1142 Type 2 diabetes mellitus with diabetic polyneuropathy: Secondary | ICD-10-CM | POA: Diagnosis present

## 2021-05-06 DIAGNOSIS — G894 Chronic pain syndrome: Secondary | ICD-10-CM | POA: Diagnosis present

## 2021-05-06 DIAGNOSIS — I129 Hypertensive chronic kidney disease with stage 1 through stage 4 chronic kidney disease, or unspecified chronic kidney disease: Secondary | ICD-10-CM | POA: Diagnosis present

## 2021-05-06 DIAGNOSIS — M109 Gout, unspecified: Secondary | ICD-10-CM | POA: Diagnosis present

## 2021-05-06 DIAGNOSIS — Z7902 Long term (current) use of antithrombotics/antiplatelets: Secondary | ICD-10-CM

## 2021-05-06 DIAGNOSIS — Z87891 Personal history of nicotine dependence: Secondary | ICD-10-CM

## 2021-05-06 LAB — CBC WITH DIFFERENTIAL/PLATELET
Abs Immature Granulocytes: 0.02 10*3/uL (ref 0.00–0.07)
Basophils Absolute: 0 10*3/uL (ref 0.0–0.1)
Basophils Relative: 1 %
Eosinophils Absolute: 0.3 10*3/uL (ref 0.0–0.5)
Eosinophils Relative: 8 %
HCT: 30.3 % — ABNORMAL LOW (ref 39.0–52.0)
Hemoglobin: 9.9 g/dL — ABNORMAL LOW (ref 13.0–17.0)
Immature Granulocytes: 0 %
Lymphocytes Relative: 18 %
Lymphs Abs: 0.8 10*3/uL (ref 0.7–4.0)
MCH: 30.1 pg (ref 26.0–34.0)
MCHC: 32.7 g/dL (ref 30.0–36.0)
MCV: 92.1 fL (ref 80.0–100.0)
Monocytes Absolute: 0.3 10*3/uL (ref 0.1–1.0)
Monocytes Relative: 6 %
Neutro Abs: 3.1 10*3/uL (ref 1.7–7.7)
Neutrophils Relative %: 67 %
Platelets: 126 10*3/uL — ABNORMAL LOW (ref 150–400)
RBC: 3.29 MIL/uL — ABNORMAL LOW (ref 4.22–5.81)
RDW: 14.9 % (ref 11.5–15.5)
WBC: 4.5 10*3/uL (ref 4.0–10.5)
nRBC: 0 % (ref 0.0–0.2)

## 2021-05-06 LAB — RESP PANEL BY RT-PCR (FLU A&B, COVID) ARPGX2
Influenza A by PCR: NEGATIVE
Influenza B by PCR: NEGATIVE
SARS Coronavirus 2 by RT PCR: NEGATIVE

## 2021-05-06 LAB — COMPREHENSIVE METABOLIC PANEL
ALT: 30 U/L (ref 0–44)
AST: 49 U/L — ABNORMAL HIGH (ref 15–41)
Albumin: 3.8 g/dL (ref 3.5–5.0)
Alkaline Phosphatase: 106 U/L (ref 38–126)
Anion gap: 7 (ref 5–15)
BUN: 55 mg/dL — ABNORMAL HIGH (ref 8–23)
CO2: 22 mmol/L (ref 22–32)
Calcium: 8.6 mg/dL — ABNORMAL LOW (ref 8.9–10.3)
Chloride: 110 mmol/L (ref 98–111)
Creatinine, Ser: 1.4 mg/dL — ABNORMAL HIGH (ref 0.61–1.24)
GFR, Estimated: 51 mL/min — ABNORMAL LOW (ref >60)
Glucose, Bld: 160 mg/dL — ABNORMAL HIGH (ref 70–99)
Potassium: 4.7 mmol/L (ref 3.5–5.1)
Sodium: 139 mmol/L (ref 135–145)
Total Bilirubin: 0.5 mg/dL (ref 0.3–1.2)
Total Protein: 7.5 g/dL (ref 6.5–8.1)

## 2021-05-06 LAB — GLUCOSE, CAPILLARY
Glucose-Capillary: 209 mg/dL — ABNORMAL HIGH (ref 70–99)
Glucose-Capillary: 277 mg/dL — ABNORMAL HIGH (ref 70–99)

## 2021-05-06 LAB — PROCALCITONIN: Procalcitonin: <0.1 ng/mL

## 2021-05-06 LAB — BRAIN NATRIURETIC PEPTIDE: B Natriuretic Peptide: 118.7 pg/mL — ABNORMAL HIGH (ref 0.0–100.0)

## 2021-05-06 LAB — TROPONIN I (HIGH SENSITIVITY)
Troponin I (High Sensitivity): 13 ng/L (ref <18)
Troponin I (High Sensitivity): 12 ng/L (ref <18)

## 2021-05-06 MED ORDER — ALBUTEROL SULFATE HFA 108 (90 BASE) MCG/ACT IN AERS
2.0000 | INHALATION_SPRAY | RESPIRATORY_TRACT | Status: DC | PRN
Start: 2021-05-06 — End: 2021-05-09
  Filled 2021-05-06: qty 6.7

## 2021-05-06 MED ORDER — TAMSULOSIN HCL 0.4 MG PO CAPS
0.4000 mg | ORAL_CAPSULE | Freq: Every day | ORAL | Status: DC
  Administered 2021-05-06 – 2021-05-09 (×4): 0.4 mg via ORAL
  Filled 2021-05-06 (×4): qty 1

## 2021-05-06 MED ORDER — AMLODIPINE BESYLATE 5 MG PO TABS
5.0000 mg | ORAL_TABLET | Freq: Every day | ORAL | Status: DC
  Administered 2021-05-06 – 2021-05-09 (×4): 5 mg via ORAL
  Filled 2021-05-06 (×4): qty 1

## 2021-05-06 MED ORDER — ASPIRIN EC 81 MG PO TBEC
81.0000 mg | DELAYED_RELEASE_TABLET | Freq: Every day | ORAL | Status: DC
  Administered 2021-05-06 – 2021-05-09 (×4): 81 mg via ORAL
  Filled 2021-05-06 (×4): qty 1

## 2021-05-06 MED ORDER — INSULIN ASPART 100 UNIT/ML IJ SOLN
0.0000 [IU] | Freq: Every day | INTRAMUSCULAR | Status: DC
  Administered 2021-05-06: 3 [IU] via SUBCUTANEOUS
  Administered 2021-05-07: 2 [IU] via SUBCUTANEOUS
  Filled 2021-05-06 (×2): qty 1

## 2021-05-06 MED ORDER — DM-GUAIFENESIN ER 30-600 MG PO TB12
1.0000 | ORAL_TABLET | Freq: Two times a day (BID) | ORAL | Status: DC | PRN
  Administered 2021-05-09: 1 via ORAL
  Filled 2021-05-06: qty 1

## 2021-05-06 MED ORDER — INSULIN GLARGINE 100 UNIT/ML ~~LOC~~ SOLN
15.0000 [IU] | Freq: Every day | SUBCUTANEOUS | Status: DC
  Administered 2021-05-06 – 2021-05-08 (×3): 15 [IU] via SUBCUTANEOUS
  Filled 2021-05-06 (×4): qty 0.15

## 2021-05-06 MED ORDER — ACETAMINOPHEN 325 MG PO TABS
650.0000 mg | ORAL_TABLET | Freq: Four times a day (QID) | ORAL | Status: DC | PRN
  Administered 2021-05-06 – 2021-05-07 (×2): 650 mg via ORAL
  Filled 2021-05-06 (×2): qty 2

## 2021-05-06 MED ORDER — ROSUVASTATIN CALCIUM 10 MG PO TABS
40.0000 mg | ORAL_TABLET | Freq: Every day | ORAL | Status: DC
  Administered 2021-05-06 – 2021-05-09 (×4): 40 mg via ORAL
  Filled 2021-05-06 (×3): qty 4
  Filled 2021-05-06: qty 2

## 2021-05-06 MED ORDER — BACLOFEN 10 MG PO TABS
10.0000 mg | ORAL_TABLET | Freq: Two times a day (BID) | ORAL | Status: DC
  Administered 2021-05-06 – 2021-05-09 (×7): 10 mg via ORAL
  Filled 2021-05-06 (×9): qty 1

## 2021-05-06 MED ORDER — LACTATED RINGERS IV SOLN: INTRAVENOUS | Status: DC

## 2021-05-06 MED ORDER — SODIUM CHLORIDE 0.9 % IV SOLN
1.0000 g | Freq: Once | INTRAVENOUS | Status: AC
  Administered 2021-05-06: 1 g via INTRAVENOUS
  Filled 2021-05-06: qty 10

## 2021-05-06 MED ORDER — LORATADINE 10 MG PO TABS
10.0000 mg | ORAL_TABLET | Freq: Every day | ORAL | Status: DC
  Administered 2021-05-06 – 2021-05-09 (×4): 10 mg via ORAL
  Filled 2021-05-06 (×4): qty 1

## 2021-05-06 MED ORDER — ONDANSETRON HCL 4 MG/2ML IJ SOLN: 4.0000 mg | Freq: Three times a day (TID) | INTRAMUSCULAR | Status: DC | PRN

## 2021-05-06 MED ORDER — HYDRALAZINE HCL 20 MG/ML IJ SOLN
5.0000 mg | INTRAMUSCULAR | Status: DC | PRN
  Administered 2021-05-06: 5 mg via INTRAVENOUS
  Filled 2021-05-06: qty 1

## 2021-05-06 MED ORDER — AZITHROMYCIN 500 MG PO TABS
500.0000 mg | ORAL_TABLET | Freq: Once | ORAL | Status: AC
  Administered 2021-05-06: 500 mg via ORAL
  Filled 2021-05-06: qty 1

## 2021-05-06 MED ORDER — ALLOPURINOL 100 MG PO TABS
100.0000 mg | ORAL_TABLET | Freq: Every day | ORAL | Status: DC
  Administered 2021-05-06 – 2021-05-09 (×4): 100 mg via ORAL
  Filled 2021-05-06 (×5): qty 1

## 2021-05-06 MED ORDER — GABAPENTIN 100 MG PO CAPS
100.0000 mg | ORAL_CAPSULE | Freq: Every day | ORAL | Status: DC
  Administered 2021-05-06 – 2021-05-09 (×4): 100 mg via ORAL
  Filled 2021-05-06 (×4): qty 1

## 2021-05-06 MED ORDER — ADULT MULTIVITAMIN W/MINERALS CH
1.0000 | ORAL_TABLET | Freq: Every day | ORAL | Status: DC
  Administered 2021-05-06 – 2021-05-09 (×4): 1 via ORAL
  Filled 2021-05-06 (×4): qty 1

## 2021-05-06 MED ORDER — SERTRALINE HCL 50 MG PO TABS
200.0000 mg | ORAL_TABLET | Freq: Every day | ORAL | Status: DC
  Administered 2021-05-06 – 2021-05-09 (×4): 200 mg via ORAL
  Filled 2021-05-06 (×4): qty 4

## 2021-05-06 MED ORDER — ISOSORBIDE MONONITRATE ER 30 MG PO TB24
30.0000 mg | ORAL_TABLET | Freq: Every day | ORAL | Status: DC
  Administered 2021-05-06 – 2021-05-09 (×4): 30 mg via ORAL
  Filled 2021-05-06 (×4): qty 1

## 2021-05-06 MED ORDER — LACTATED RINGERS IV BOLUS
1000.0000 mL | Freq: Once | INTRAVENOUS | Status: AC
  Administered 2021-05-06: 1000 mL via INTRAVENOUS

## 2021-05-06 MED ORDER — CARVEDILOL 6.25 MG PO TABS
6.2500 mg | ORAL_TABLET | Freq: Every day | ORAL | Status: DC
  Administered 2021-05-06 – 2021-05-08 (×3): 6.25 mg via ORAL
  Filled 2021-05-06 (×3): qty 1

## 2021-05-06 MED ORDER — VITAMIN D 25 MCG (1000 UNIT) PO TABS
5000.0000 [IU] | ORAL_TABLET | Freq: Every day | ORAL | Status: DC
  Administered 2021-05-06 – 2021-05-09 (×4): 5000 [IU] via ORAL
  Filled 2021-05-06 (×4): qty 5

## 2021-05-06 MED ORDER — INSULIN ASPART 100 UNIT/ML IJ SOLN
0.0000 [IU] | Freq: Three times a day (TID) | INTRAMUSCULAR | Status: DC
  Administered 2021-05-06: 3 [IU] via SUBCUTANEOUS
  Administered 2021-05-07: 5 [IU] via SUBCUTANEOUS
  Administered 2021-05-07: 1 [IU] via SUBCUTANEOUS
  Administered 2021-05-07: 3 [IU] via SUBCUTANEOUS
  Administered 2021-05-08: 5 [IU] via SUBCUTANEOUS
  Administered 2021-05-08: 1 [IU] via SUBCUTANEOUS
  Administered 2021-05-08 – 2021-05-09 (×2): 3 [IU] via SUBCUTANEOUS
  Administered 2021-05-09: 1 [IU] via SUBCUTANEOUS
  Filled 2021-05-06 (×9): qty 1

## 2021-05-06 MED ORDER — LOPERAMIDE HCL 2 MG PO CAPS: 2.0000 mg | ORAL_CAPSULE | ORAL | Status: DC | PRN

## 2021-05-06 MED ORDER — PANTOPRAZOLE SODIUM 40 MG PO TBEC
40.0000 mg | DELAYED_RELEASE_TABLET | Freq: Every day | ORAL | Status: DC
  Administered 2021-05-06 – 2021-05-09 (×4): 40 mg via ORAL
  Filled 2021-05-06 (×4): qty 1

## 2021-05-06 NOTE — ED Notes (Signed)
This RN to bedside, introduced self to patient. Pt awakens easily upon this RN arrival to bedside. Abx initiated per order by this RN. Lights dimmed for patient comfort. VSS at this time.

## 2021-05-06 NOTE — ED Notes (Signed)
Physical therapy at bedside

## 2021-05-06 NOTE — H&P (Addendum)
History and Physical    Nathan Singleton [id number] DOB: 1941/07/05 DOA: 05/06/2021  Referring MD/NP/PA:   PCP: Ranae Plumber, [place]   Patient coming from:  The patient is coming from home.  At baseline, pt is independent for most of ADL.        Chief Complaint: syncope and diarrhea  HPI: Nathan Singleton is a 80 y.o. male with medical history significant of hypertension, hyperlipidemia, diabetes mellitus, COPD, GERD, gout, depression, aortic stenosis, s/p of TAVR, PVD, pacemaker placement, GI bleeding, CAD, CABG, ascending aortic aneurysm, aneurysm of coronary vessels, chronic pain syndrome, anemia, BPH, bipolar, CKD stage IIIa, thrombocytopenia, who presents with syncope and diarrhea.  Patient states that he passed out for few seconds when he was walking to the bathroom and started feeling dizzy yesterday evening.  Patient denies any unilateral numbness or tingling in extremities.  No facial droop or slurred speech.  Per EMS report, patient was positive for orthostatic vital sign, with blood pressure 130 on sitting and 70s on standing up.  Per her daughter (I called her daughter by phone), patient was recently prescribed Keflex for possible pneumonia.  Currently patient has mild dry cough, denies chest pain, shortness of breath, fever or chills.  Patient has chronic intermittent diarrhea for several months.  Patient has appointment with GI for colonoscopy, which was canceled several times due to some medical issues.  No nausea, vomiting or abdominal pain.  Patient had right greater toe amputation by Dr.Cline on 04/29/2021.  Patient has little bleeding from the wound which is controlled.  ED Course: pt was found to have troponin level 13, 12, negative COVID PCR, BNP 118, renal function close to baseline, liver function okay, temperature normal, blood pressure 169/86, heart rate 59, RR 21, oxygen saturation 98% on room air.  Chest x-ray showed questionable right middle lobe airspace disease.  CT head  is slightly limited study, but is negative for acute intracranial issues.  Patient is admitted to [place] bed as inpatient by accepting MD.   Review of Systems:   General: no fevers, chills, no body weight gain, has fatigue HEENT: no blurry vision, hearing changes or sore throat Respiratory: no dyspnea, has coughing, no  wheezing CV: no chest pain, no palpitations GI: no nausea, vomiting, abdominal pain,  Has diarrhea, no constipation GU: no dysuria, burning on urination, increased urinary frequency, hematuria  Ext: no leg edema Neuro: no unilateral weakness, numbness, or tingling, no vision change or hearing loss. Has syncope. Skin: no rash. S/p of right great toe amputation with little bleeding. MSK: No muscle spasm, no deformity, no limitation of range of movement in spin Heme: No easy bruising.  Travel history: No recent long distant travel.  Allergy:  Allergies  Allergen Reactions  . Atorvastatin Other (See Comments)    "bad for kidneys" Cramping   . Celecoxib Other (See Comments)    Other reaction(s): Other (See Comments) Kidney Problem Kidney Problem   . Dicyclomine Hcl Other (See Comments)    Stomach cramps  . Doxycycline Other (See Comments)    Per wife (per RN)- pt gets severe stomach pains even when takes with food and PCP said to not take    . Metformin Other (See Comments)    Past Medical History:  Diagnosis Date  . Aneurysm (arteriovenous) of coronary vessels   . Arthritis   . Ascending aortic aneurysm ([place])   . CAD (coronary artery disease)   . Diabetes mellitus without complication ([place])   . History of GI  bleed   . Hyperlipidemia associated with type 2 diabetes mellitus ([place])   . Hypertension associated with diabetes ([place])   . Presence of permanent cardiac pacemaker   . PVD (peripheral vascular disease) ([place])   . S/P TAVR (transcatheter aortic valve replacement)     Past Surgical History:  Procedure Laterality Date  . AMPUTATION TOE Right  03/20/2021   Procedure: AMPUTATION TOE;  Surgeon: Sharlotte Alamo, DPM;  Location: [facility] ORS;  Service: Podiatry;  Laterality: Right;  . Ankle repaired Right   . Carpal Tunnel repaired Left   . CATARACT EXTRACTION Bilateral   . COLONOSCOPY    . Copillar implant     . DG CHOLECYSTOGRAPHY GALL BLADDER ([place] HX)    . GASTRIC BYPASS    . heart valve replaced    . IRRIGATION AND DEBRIDEMENT FOOT Right 02/18/2021   Procedure: IRRIGATION AND DEBRIDEMENT FOOT-Right Great Toe;  Surgeon: [name], DPM;  Location: [facility] ORS;  Service: Podiatry;  Laterality: Right;  . LOWER EXTREMITY ANGIOGRAPHY Right 02/17/2021   Procedure: Lower Extremity Angiography;  Surgeon: Algernon Huxley, MD;  Location: [place] [facility];  Service: Cardiovascular;  Laterality: Right;  . LOWER EXTREMITY ANGIOGRAPHY Right 03/27/2021   Procedure: Lower Extremity Angiography;  Surgeon: Algernon Huxley, MD;  Location: [place] [facility];  Service: Cardiovascular;  Laterality: Right;  . PACEMAKER GENERATOR CHANGE    . REPLACEMENT TOTAL KNEE BILATERAL    . RTC    . STENT PLACE LEFT URETER ([facility] HX) Right    Right Leg  . TEE WITHOUT CARDIOVERSION N/A 03/26/2021   Procedure: TRANSESOPHAGEAL ECHOCARDIOGRAM (TEE);  Surgeon: Kate Sable, MD;  Location: [facility] OR[facility];  Service: Cardiovascular;  Laterality: N/A;  . Toe nail removed Bilateral     Social History:  reports that he has quit smoking. He has never used smokeless tobacco. He reports previous alcohol use. He reports previous drug use.  Family History:  Family History  Problem Relation Age of Onset  . Heart disease Mother   . Alzheimer's disease Mother   . Heart disease Father   . Diabetes Father      Prior to Admission medications   Medication Sig Start Date End Date Taking? Authorizing Provider  allopurinol (ZYLOPRIM) 100 MG tablet Take 100 mg by mouth daily. 10/19/20  Yes [provider]  amLODipine (NORVASC) 5 MG tablet Take 1 tablet (5 mg total) by mouth daily.  03/28/21  Yes Wieting, Richard, MD  baclofen (LIORESAL) 10 MG tablet Take 10 mg by mouth 2 (two) times daily. 02/12/21  Yes [provider]  BAYER ASPIRIN EC LOW DOSE 81 MG EC tablet Take 81 mg by mouth daily. 11/11/20  Yes [provider]  carvedilol (COREG) 6.25 MG tablet Take 6.25 mg by mouth at bedtime. 03/11/21  Yes [provider]  cephALEXin (KEFLEX) 500 MG capsule Take 500 mg by mouth 4 (four) times daily. 04/21/21  Yes [provider]  cetirizine (ZYRTEC) 10 MG tablet Take 10 mg by mouth daily. 11/11/20  Yes [provider]  clopidogrel (PLAVIX) 75 MG tablet Take 1 tablet (75 mg total) by mouth daily. 02/22/21  Yes Charlynne Cousins, MD  D-5000 125 MCG (5000 UT) TABS Take 1 tablet by mouth daily. 11/11/20  Yes [provider]  docusate sodium (COLACE) 100 MG capsule Take 200 mg by mouth 2 (two) times daily. 11/11/20  Yes [provider]  furosemide (LASIX) 20 MG tablet Take 20 mg by mouth daily. 10/19/20  Yes [provider]  gabapentin (NEURONTIN) 100 MG capsule Take 100 mg by mouth daily. 01/29/21  Yes [provider]  insulin glargine (LANTUS) 100 UNIT/ML Solostar Pen Inject 22 Units into the skin at bedtime. 03/28/21  Yes Wieting, Richard, MD  isosorbide mononitrate (IMDUR) 30 MG 24 hr tablet Take 30 mg by mouth daily. 02/12/21  Yes [provider]  LINZESS 145 MCG CAPS capsule Take 145 mcg by mouth daily. 11/11/20  Yes [provider]  loperamide (IMODIUM) 2 MG capsule Take 1 capsule (2 mg total) by mouth as needed for diarrhea or loose stools. 03/31/21  Yes Blake Divine, MD  multivitamin (ONE-A-DAY MEN'S) TABS tablet Take 1 tablet by mouth daily. 11/11/20  Yes [provider]  pantoprazole (PROTONIX) 40 MG tablet Take 1 tablet (40 mg total) by mouth daily. 02/11/21  Yes [name], MD  rosuvastatin (CRESTOR) 40 MG tablet Take 40 mg by mouth daily. 02/13/21  Yes [provider]   sertraline (ZOLOFT) 100 MG tablet Take 200 mg by mouth daily. 11/11/20  Yes [provider]  tamsulosin (FLOMAX) 0.4 MG CAPS capsule Take 0.4 mg by mouth daily. 11/11/20  Yes [provider]    Physical Exam: Vitals:   05/06/21 1300 05/06/21 1330 05/06/21 1400 05/06/21 1430  BP: (!) 159/78 (!) 160/78 (!) 146/68 (!) 149/65  Pulse: (!) 46 64 (!) 58 (!) 59  Resp: 15 (!) 24 (!) 24 16  Temp:      TempSrc:      SpO2: 94% 100% 99% 100%  Weight:      Height:       General: Not in acute distress HEENT:       Eyes: PERRL, EOMI, no scleral icterus.       ENT: No discharge from the ears and nose, no pharynx injection, no tonsillar enlargement.        Neck: No JVD, no bruit, no mass felt. Heme: No neck lymph node enlargement. Cardiac: S1/S2, RRR, No gallops or rubs. Respiratory: No rales, wheezing, rhonchi or rubs. GI: Soft, nondistended, nontender, no rebound pain, no organomegaly, BS present. GU: No hematuria Ext:  S/p of right great toe amputation with little bleeding. Right second toe also has skin denudation as shown below:       Musculoskeletal: No joint deformities, No joint redness or warmth, no limitation of ROM in spin. Skin: No rashes.  Neuro: Alert, oriented X3, cranial nerves II-XII grossly intact, moves all extremities normally.  Psych: Patient is not psychotic, no suicidal or hemocidal ideation.  Labs on Admission: I have personally reviewed following labs and imaging studies  CBC: Recent Labs  Lab 05/06/21 0150  WBC 4.5  NEUTROABS 3.1  HGB 9.9*  HCT 30.3*  MCV 92.1  PLT 123XX123*   Basic Metabolic Panel: Recent Labs  Lab 05/06/21 0150  NA 139  K 4.7  CL 110  CO2 22  GLUCOSE 160*  BUN 55*  CREATININE 1.40*  CALCIUM 8.6*   GFR: Estimated Creatinine Clearance: 56.5 mL/min (A) (by C-G formula based on SCr of 1.4 mg/dL (H)). Liver Function Tests: Recent Labs  Lab 05/06/21 0150  AST 49*  ALT 30  ALKPHOS 106  BILITOT 0.5  PROT 7.5   ALBUMIN 3.8   No results for input(s): LIPASE, AMYLASE in the last 168 hours. No results for input(s): AMMONIA in the last 168 hours. Coagulation Profile: No results for input(s): INR, PROTIME in the last 168 hours. Cardiac Enzymes: No results for input(s):  CKTOTAL, CKMB, CKMBINDEX, TROPONINI in the last 168 hours. BNP (last 3 results) No results for input(s): PROBNP in the last 8760 hours. HbA1C: No results for input(s): HGBA1C in the last 72 hours. CBG: No results for input(s): GLUCAP in the last 168 hours. Lipid Profile: No results for input(s): CHOL, HDL, LDLCALC, TRIG, CHOLHDL, LDLDIRECT in the last 72 hours. Thyroid Function Tests: No results for input(s): TSH, T4TOTAL, FREET4, T3FREE, THYROIDAB in the last 72 hours. Anemia Panel: No results for input(s): VITAMINB12, FOLATE, FERRITIN, TIBC, IRON, RETICCTPCT in the last 72 hours. Urine analysis:    Component Value Date/Time   COLORURINE STRAW (A) 02/13/2021 1507   APPEARANCEUR CLEAR (A) 02/13/2021 1507   LABSPEC 1.011 02/13/2021 1507   PHURINE 5.0 02/13/2021 1507   GLUCOSEU NEGATIVE 02/13/2021 1507   HGBUR NEGATIVE 02/13/2021 1507   BILIRUBINUR NEGATIVE 02/13/2021 1507   KETONESUR NEGATIVE 02/13/2021 1507   PROTEINUR NEGATIVE 02/13/2021 1507   NITRITE NEGATIVE 02/13/2021 1507   LEUKOCYTESUR NEGATIVE 02/13/2021 1507   Sepsis Labs: '@LABRCNTIP'$ (procalcitonin:4,lacticidven:4) ) Recent Results (from the past 240 hour(s))  Resp Panel by RT-PCR (Flu A&B, Covid) Nasopharyngeal Swab     Status: None   Collection Time: 05/06/21  2:19 AM   Specimen: Nasopharyngeal Swab; Nasopharyngeal(NP) swabs in vial transport medium  Result Value Ref Range Status   SARS Coronavirus 2 by RT PCR NEGATIVE NEGATIVE Final    Comment: (NOTE) SARS-CoV-2 target nucleic acids are NOT DETECTED.  The SARS-CoV-2 RNA is generally detectable in upper respiratory specimens during the acute phase of infection. The lowest concentration of SARS-CoV-2  viral copies this assay can detect is 138 copies/mL. A negative result does not preclude SARS-Cov-2 infection and should not be used as the sole basis for treatment or other patient management decisions. A negative result may occur with  improper specimen collection/handling, submission of specimen other than nasopharyngeal swab, presence of viral mutation(s) within the areas targeted by this assay, and inadequate number of viral copies(<138 copies/mL). A negative result must be combined with clinical observations, patient history, and epidemiological information. The expected result is Negative.  Fact Sheet for Patients:  EntrepreneurPulse.com.au  Fact Sheet for Healthcare Providers:  IncredibleEmployment.be  This test is no t yet approved or cleared by the [place] FDA and  has been authorized for detection and/or diagnosis of SARS-CoV-2 by FDA under an Emergency Use Authorization (EUA). This EUA will remain  in effect (meaning this test can be used) for the duration of the COVID-19 declaration under Section 564(b)(1) of the Act, 21 U.S.C.section 360bbb-3(b)(1), unless the authorization is terminated  or revoked sooner.       Influenza A by PCR NEGATIVE NEGATIVE Final   Influenza B by PCR NEGATIVE NEGATIVE Final    Comment: (NOTE) The Xpert Xpress SARS-CoV-2/FLU/RSV plus assay is intended as an aid in the diagnosis of influenza from Nasopharyngeal swab specimens and should not be used as a sole basis for treatment. Nasal washings and aspirates are unacceptable for Xpert Xpress SARS-CoV-2/FLU/RSV testing.  Fact Sheet for Patients: EntrepreneurPulse.com.au  Fact Sheet for Healthcare Providers: IncredibleEmployment.be  This test is not yet approved or cleared by the [place] FDA and has been authorized for detection and/or diagnosis of SARS-CoV-2 by FDA under an Emergency Use Authorization  (EUA). This EUA will remain in effect (meaning this test can be used) for the duration of the COVID-19 declaration under Section 564(b)(1) of the Act, 21 U.S.C. section 360bbb-3(b)(1), unless the authorization is terminated or revoked.  Performed at  [facility], [address], [place], [place] [address]      Radiological Exams on Admission: CT HEAD WO CONTRAST  Result Date: 05/06/2021 CLINICAL DATA:  Syncopal episode EXAM: CT HEAD WITHOUT CONTRAST TECHNIQUE: Contiguous axial images were obtained from the base of the skull through the vertex without intravenous contrast. COMPARISON:  12/31/2020 FINDINGS: Brain: Stable mild atrophy pattern without acute intracranial hemorrhage, definite mass lesion, new infarction hemorrhage, midline shift, herniation, hydrocephalus, or extra-axial fluid collection. No focal mass effect or edema. 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 Cisterns are patent.  Cerebellar atrophy as well. Vascular: Intracranial atherosclerosis at the skull base. No hyperdense vessel. Skull: Postop changes of the left mastoid. Mastoids are clear. No acute osseous finding. Sinuses/Orbits: No acute finding. Other: None. IMPRESSION: Stable atrophy pattern without acute intracranial abnormality by noncontrast CT. Limited by artifact from a left cochlear implant. Electronically Signed   By: Jerilynn Mages.  Shick M.D.   On: 05/06/2021 10:15   DG Chest Portable 1 View  Result Date: 05/06/2021 CLINICAL DATA:  Syncope. EXAM: PORTABLE CHEST 1 VIEW COMPARISON:  Chest x-ray 01/13/2021, CT chest 11/15/2020 FINDINGS: Left chest wall 2 lead cardiac pacemaker in similar position. The heart size and mediastinal contours are within normal limits. Question airspace opacity within the right mid lung zone. No pulmonary edema. No pleural effusion. No pneumothorax. No acute osseous abnormality. Bilateral shoulder degenerative changes. IMPRESSION: Question airspace opacity within the right mid lung  zone. Finding could represent infection/inflammation. Followup PA and lateral chest X-ray is recommended in 3-4 weeks following therapy to ensure resolution and exclude underlying malignancy. Electronically Signed   By: Iven Finn M.D.   On: 05/06/2021 03:32     EKG: I have personally reviewed.  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  Per EMS report, patient had positive orthostatic vital sign.  Blood pressure dropped from 130 to 70s when position changed from sitting to standing.  Patient does not have focal neurodeficit on physical examination.  CT head is negative for acute intracranial abnormality.  Denies any chest pain and shortness of breath, low suspicion for cardiac etiology.  Patient has chronic diarrhea, and continues to take Lasix, which has likely caused volume depletion.  -Admitted to MedSurg bed as inpatient by accepting MD -Frequent neuro check -IV fluid: 1 L LR, followed by 75 cc/h -PT/OT  Hypertension -IV hydralazine as needed -Hold Lasix -Amlodipine,  CAD (coronary artery disease): S/p of CABG. no chest pain -Continue aspirin, Crestor, imdur  Thrombocytopenia ([place]): This is chronic issue.  Platelet 126 -Follow-up with CBC  COPD (chronic obstructive pulmonary disease) ([place]): Stable -Bronchodilators  Bipolar disorder and depression -Zoloft  Gout -Allopurinol  Hyperlipidemia -Crestor  BPH (benign prostatic  hyperplasia) -Flomax  Anemia of chronic disease (normocytic anemia): Hemoglobin stable, 9.9 (8.0 on 03/31/2021) -Follow-up with CBC  Questionable CAP (community acquired pneumonia): Patient is on Keflex for possible pneumonia, but patient does not have chest pain, shortness breath,  no fever or leukocytosis.  Procalcitonin <0.10.  I do not think patient has pneumonia clinically. -Discontinue Keflex  CKD (chronic kidney disease), stage IIIa: Stable -Follow-up with BMP  Diarrhea -Check C. 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   Dispo: The patient is from: Home              Anticipated d/c is to: Home              Patient currently is not medically stable to d/c.   Difficult to place patient No          Date of Service 05/06/2021    Ivor Costa [facility]   If 7PM-7AM, please contact night-coverage [email or web address] 05/06/2021, 3:46 PM

## 2021-05-06 NOTE — ED Notes (Signed)
Pt sitting up in bed eating breakfast at this time

## 2021-05-06 NOTE — Evaluation (Signed)
Occupational Therapy Evaluation Patient Details Name: Nathan Singleton MRN: [id number] DOB: 07/16/1941 Today's Date: 05/06/2021    History of Present Illness 80 y.o. male with a history of CAD, sick sinus syndrome status post [employer] pacemaker, ascending aortic aneurysm, diabetes, peptic ulcer disease, GI bleeding, aortic valve replacement, hypertension, hyperlipidemia, chronic kidney disease who presents for evaluation of syncope. Head CT negative   Clinical Impression   Pt seen for OT evaluation this date. Upon arrival to room, pt awake and in semi-fowler's position. Pt agreeable to OT/PT co-evaluation. Prior to admission, pt was living in a 1-level home with wife. 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 Pt reports that wife has a bad back and is unable to assist him from the floor after falls and that he usually calls EMS to assist. Pt reports requiring some assistance from wife for LB dressing, toilet hygiene, and bathing. Pt currently presents with decreased strength and balance, and requires MIN A for bed mobility, MIN GUARD-MIN A for sit>stand transfers, and MIN GUARD-MIN A for marching in place with b/l UE support from RW. Pt negative for orthostatic hypotension (see flowsheet), however endorses decreased strength in LE. Pt assisted back to supine, requiring only MIN GUARD. Pt would benefit from additional skilled OT services to maximize return to PLOF and minimize risk of future falls, injury, caregiver burden, and readmission. Upon discharge, recommend SNF .      Follow Up Recommendations  SNF    Equipment Recommendations  3 in 1 bedside commode (bariatric BSC)       Precautions / Restrictions Precautions Precautions: Fall Restrictions Weight Bearing Restrictions: No      Mobility Bed Mobility Overal bed  mobility: Needs Assistance Bed Mobility: Supine to Sit;Sit to Supine     Supine to sit: Min assist Sit to supine: Min guard        Transfers Overall transfer level: Needs assistance Equipment used: 2 person hand held assist;Rolling walker (2 wheeled) Transfers: Sit to/from Stand Sit to Stand: Min guard;Min assist         General transfer comment: Pt required MIN A for steadying upon standing in 1st standing bout without RW. Able to progress to MIN GUARD with use of RW in 2nd bout of standing    Balance Overall balance assessment: Needs assistance Sitting-balance support: No upper extremity supported;Feet supported Sitting balance-Leahy Scale: Fair Sitting balance - Comments: Fair sitting balance at EOB while reaching within BOS   Standing balance support: Bilateral upper extremity supported;During functional activity Standing balance-Leahy Scale: Poor Standing balance comment: Requires b/l UE support from RW to march in place                           ADL either performed or assessed with clinical judgement   ADL Overall ADL's : Needs assistance/impaired                     Lower Body Dressing: Maximal assistance;Sitting/lateral leans Lower Body Dressing Details (indicate cue type and reason): to don socks while seated EOB               General ADL Comments: set-up/supervision for seated UB ADLs     Vision Baseline Vision/History: Wears glasses Wears Glasses: Reading only Patient Visual Report: No change  from baseline              Pertinent Vitals/Pain Pain Assessment: No/denies pain     Hand Dominance Right   Extremity/Trunk Assessment Upper Extremity Assessment Upper Extremity Assessment: LUE deficits/detail;Overall [facility] for tasks assessed LUE Deficits / Details: Since fall a few months ago, limted AROM of LUE; shoulder flexion 0-80, elbow flexion 0-100, grip strength WFL. LUE Sensation: WNL   Lower Extremity Assessment Lower  Extremity Assessment: Generalized weakness       Communication Communication Communication: HOH (Pt has a cochlear implant - he hears better on the L side which is the side of the implant)   Cognition Arousal/Alertness: Awake/alert Behavior During Therapy: WFL for tasks assessed/performed Overall Cognitive Status: Within Functional Limits for tasks assessed                                 General Comments: Very HOH, however pleasant and agreeable throughout. Follows 1-step commands consistently              Home Living Family/patient expects to be discharged to:: Private residence Living Arrangements: Spouse/significant other Available Help at Discharge: Family;Available 24 hours/day (wife able to provide some assist, however pt reports that wife has a "bad back") Type of Home: House Home Access: Stairs to enter [employer] of Steps: 1 partial step   Home Layout: One level     Bathroom Shower/Tub: Other (comment) (Pt reports spongebathing at baseline)         Home Equipment: Walker - 2 wheels;Bedside commode;Cane - single point   Additional Comments: Pt reports BSC is too narrow      Prior Functioning/Environment Level of Independence: Independent with assistive device(s);Needs assistance  Gait / Transfers Assistance Needed: Pt reports being MOD-I with RW for functional mobility. 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            OT Problem List: Decreased strength;Decreased activity tolerance;Impaired balance (sitting and/or standing)      OT Treatment/Interventions: Self-care/ADL training;Therapeutic exercise;Energy conservation;DME and/or AE instruction;Therapeutic activities;Patient/family education;Balance training     OT Goals(Current goals can be found in the care plan section) Acute Rehab OT Goals Patient Stated Goal: to improve strength OT Goal Formulation: With patient Time For Goal Achievement: 05/20/21 Potential to Achieve Goals: Good  OT Frequency: Min 1X/week           Co-evaluation PT/OT/SLP Co-Evaluation/Treatment: Yes Reason for Co-Treatment: Complexity of the patient's impairments (multi-system involvement);For patient/therapist safety;To address functional/ADL transfers PT goals addressed during session: Mobility/safety with mobility;Balance OT goals addressed during session: ADL's and self-care      AM-PAC OT "6 Clicks" Daily Activity     Outcome Measure Help from another person eating meals?: None Help from another person taking care of personal grooming?: A Little Help from another person toileting, which includes using toliet, bedpan, or urinal?: A Lot Help from another person bathing (including washing, rinsing, drying)?: A Lot Help from another person to put on and taking off regular upper body clothing?: A Little Help from another person to put on and taking off regular lower body clothing?: A Lot 6 Click Score: 16   End of Session Equipment Utilized During Treatment: Surveyor, mining  Communication: Mobility status  Activity Tolerance: Patient tolerated treatment well Patient left: in bed;with call bell/phone within reach  OT Visit Diagnosis: Unsteadiness on feet (R26.81);Muscle weakness (generalized) (M62.81)                Time: [id number] OT Time Calculation (min): 27 min Charges:  OT General Charges $OT Visit: 1 Visit OT Evaluation $OT Eval Moderate Complexity: 1 Mod OT Treatments $Self Care/Home Management : 8-22 mins  Fredirick Maudlin, OTR/L [place]

## 2021-05-06 NOTE — ED Provider Notes (Signed)
[facility] Emergency Department Provider Note  ____________________________________________  Time seen: Approximately 2:24 AM  I have reviewed the triage vital signs and the nursing notes.   HISTORY  Chief Complaint Loss of Consciousness   HPI Nathan Singleton is a 80 y.o. male with a history of CAD on Plavix and aspirin, sick sinus syndrome status post St. Jude's pacemaker, ascending aortic aneurysm, diabetes, peptic ulcer disease, GI bleeding, aortic valve replacement, hypertension, hyperlipidemia, chronic kidney disease who presents for evaluation of syncope.  History is gathered from patient and his wife is at bedside.  According to them patient has felt unwell most of the day today.  Slept for most of it.  This evening got up to go to the bathroom.  As soon as he got up from the bed he felt dizzy and passed out falling back into his bed.  He did not injure himself.  The syncopal event lasted just a few seconds and patient was back to baseline.  Patient has had diarrhea since October according to his wife.  Has an appointment to see GI but every time patient ends up in the hospital and therefore has not been able to see GI yet.  He has several daily episodes of watery diarrhea.  There is no melena.  Patient denies headache, cough or congestion, sore throat, chest pain or shortness of breath, abdominal pain, nausea or vomiting, dysuria or hematuria, fever or chills.   Past Medical History:  Diagnosis Date  . Aneurysm (arteriovenous) of coronary vessels   . Arthritis   . Ascending aortic aneurysm ([place])   . CAD (coronary artery disease)   . Diabetes mellitus without complication ([place])   . History of GI bleed   . Hyperlipidemia associated with type 2 diabetes mellitus ([place])   . Hypertension associated with diabetes ([place])   . Presence of permanent cardiac pacemaker   . PVD (peripheral vascular disease) ([place])   . S/P TAVR (transcatheter aortic valve replacement)      Patient Active Problem List   Diagnosis Date Noted  . 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 Lumbar facet joint syndrome (Bilateral) (L>R) 04/22/2021  . 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 MSSA bacteremia   . Anemia of chronic disease   . Cellulitis of great toe, right 03/20/2021  . Type II diabetes mellitus with renal manifestations ([place]) 03/20/2021  . CKD stage 3 due to type 2 diabetes mellitus ([place]) 03/20/2021  . Dizziness 03/20/2021  . Chronic pain syndrome 03/03/2021  . Pharmacologic therapy 03/03/2021  . Disorder of skeletal system 03/03/2021  . Problems influencing health status 03/03/2021  . 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[place]) 03/03/2021  . Chronic lower extremity pain (2ry area of Pain) (Bilateral) (R>L) 03/03/2021  . Chronic anticoagulation (Plavix) 03/03/2021  . Chronic shoulder pain (3ry area of Pain) (Left) 03/03/2021  . 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 Cervical facet hypertrophy (Multilevel) (Bilateral) 03/03/2021  . DDD (degenerative disc disease), cervical 03/03/2021  . BPH (benign prostatic hyperplasia) 02/14/2021  . Diabetic foot ulcer ([place]) 02/13/2021  . Normocytic anemia 11/26/2020  . Chronic kidney disease (CKD) 11/26/2020  . Claudication ([place]) 11/26/2020  . Contusion of right thigh 11/26/2020  . COPD (chronic obstructive pulmonary disease) ([place]) 11/26/2020  . Depression 11/26/2020  . Dyspnea 11/26/2020  . Erectile dysfunction 11/26/2020  . GERD without esophagitis 11/26/2020  . Gout 11/26/2020  . Hyperlipidemia,  acquired 11/26/2020  . Hyperlipidemia 11/26/2020  . Myocardial infarction ([place]) 11/26/2020  . Personal history of other diseases of urinary system 11/26/2020  . Right leg swelling 11/26/2020  . Sleep apnea 11/26/2020  . Varicose veins 11/26/2020  . Venous insufficiency 11/26/2020  . Vitamin D deficiency 11/26/2020   . Chest pain 11/21/2020  . Thrombocytopenia ([place]) 11/21/2020  . S/P TAVR (transcatheter aortic valve replacement)   . PVD (peripheral vascular disease) ([place])   . Presence of permanent cardiac pacemaker   . Hypertension associated with diabetes ([place])   . Hyperlipidemia associated with type 2 diabetes mellitus ([place])   . History of GI bleed   . CAD (coronary artery disease)   . 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 Pacemaker St Jude device 11/19/2020  . Peripheral vascular disease, unspecified ([place]) stents to popliteal arteries many years ago 11/19/2020  . History of gingival bleeding 11/19/2020  . Coronary artery disease involving coronary bypass graft of native heart with angina pectoris ([place]) 11/19/2020  . 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 Hypertension   . DM2 (diabetes mellitus, type 2) ([place])   . Arthritis   . Closed nondisplaced fracture of greater trochanter of right femur ([place]) 01/25/2020  . DDD (degenerative disc disease), lumbar 12/10/2019  . Chronic low back pain (1ry area of Pain) (Bilateral) (L>R) w/o sciatica 12/09/2019  . 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 Urinary retention 12/09/2019  . Hearing loss 05/11/2019  . Obesity 04/07/2019  . 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 Bipolar disorder ([place]) 01/01/2019  . 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 Heart block 04/25/2018  . Calculus of gallbladder with acute cholecystitis without obstruction 09/21/2017  . Gastrojejunal ulcer 03/23/2014  . Insomnia 03/22/2014  . Blisters of multiple sites 01/18/2014  . Cellulitis 01/18/2014  . OA (osteoarthritis) of knee 06/09/2013  . Status post percutaneous transluminal coronary angioplasty 01/14/2011    Past Surgical History:  Procedure Laterality Date  . AMPUTATION TOE Right 03/20/2021   Procedure: AMPUTATION TOE;   Surgeon: Sharlotte Alamo, DPM;  Location: [facility] ORS;  Service: Podiatry;  Laterality: Right;  . Ankle repaired Right   . Carpal Tunnel repaired Left   . CATARACT EXTRACTION Bilateral   . COLONOSCOPY    . Copillar implant     . DG CHOLECYSTOGRAPHY GALL BLADDER ([place] HX)    . GASTRIC BYPASS    . heart valve replaced    . IRRIGATION AND DEBRIDEMENT FOOT Right 02/18/2021   Procedure: IRRIGATION AND DEBRIDEMENT FOOT-Right Great Toe;  Surgeon: [name], DPM;  Location: [facility] ORS;  Service: Podiatry;  Laterality: Right;  . LOWER EXTREMITY ANGIOGRAPHY Right 02/17/2021   Procedure: Lower Extremity Angiography;  Surgeon: Algernon Huxley, MD;  Location: [place] CV LAB;  Service: Cardiovascular;  Laterality: Right;  . LOWER EXTREMITY ANGIOGRAPHY Right 03/27/2021   Procedure: Lower Extremity Angiography;  Surgeon: Algernon Huxley, MD;  Location: [place] [facility];  Service: Cardiovascular;  Laterality: Right;  . PACEMAKER GENERATOR CHANGE    . REPLACEMENT TOTAL KNEE BILATERAL    . RTC    . STENT PLACE LEFT URETER ([facility] HX) Right    Right Leg  . TEE WITHOUT CARDIOVERSION N/A 03/26/2021   Procedure: TRANSESOPHAGEAL ECHOCARDIOGRAM (TEE);  Surgeon: Kate Sable, MD;  Location: [facility] ORS;  Service: Cardiovascular;  Laterality: N/A;  . Toe nail removed Bilateral  Prior to Admission medications   Medication Sig Start Date End Date Taking? Authorizing Provider  allopurinol (ZYLOPRIM) 100 MG tablet Take 100 mg by mouth daily. 10/19/20  Yes [provider]  amLODipine (NORVASC) 5 MG tablet Take 1 tablet (5 mg total) by mouth daily. 03/28/21  Yes Wieting, Richard, MD  baclofen (LIORESAL) 10 MG tablet Take 10 mg by mouth 2 (two) times daily. 02/12/21  Yes [provider]  BAYER ASPIRIN EC LOW DOSE 81 MG EC tablet Take 81 mg by mouth daily. 11/11/20  Yes [provider]  carvedilol (COREG) 6.25 MG tablet Take 6.25 mg by mouth at bedtime. 03/11/21  Yes [provider]   cephALEXin (KEFLEX) 500 MG capsule Take 500 mg by mouth 4 (four) times daily. 04/21/21  Yes [provider]  cetirizine (ZYRTEC) 10 MG tablet Take 10 mg by mouth daily. 11/11/20  Yes [provider]  clopidogrel (PLAVIX) 75 MG tablet Take 1 tablet (75 mg total) by mouth daily. 02/22/21  Yes Charlynne Cousins, MD  D-5000 125 MCG (5000 UT) TABS Take 1 tablet by mouth daily. 11/11/20  Yes [provider]  docusate sodium (COLACE) 100 MG capsule Take 200 mg by mouth 2 (two) times daily. 11/11/20  Yes [provider]  furosemide (LASIX) 20 MG tablet Take 20 mg by mouth daily. 10/19/20  Yes [provider]  gabapentin (NEURONTIN) 100 MG capsule Take 100 mg by mouth daily. 01/29/21  Yes [provider]  insulin glargine (LANTUS) 100 UNIT/ML Solostar Pen Inject 22 Units into the skin at bedtime. 03/28/21  Yes Wieting, Richard, MD  isosorbide mononitrate (IMDUR) 30 MG 24 hr tablet Take 30 mg by mouth daily. 02/12/21  Yes [provider]  LINZESS 145 MCG CAPS capsule Take 145 mcg by mouth daily. 11/11/20  Yes [provider]  loperamide (IMODIUM) 2 MG capsule Take 1 capsule (2 mg total) by mouth as needed for diarrhea or loose stools. 03/31/21  Yes Blake Divine, MD  multivitamin (ONE-A-DAY MEN'S) TABS tablet Take 1 tablet by mouth daily. 11/11/20  Yes [provider]  pantoprazole (PROTONIX) 40 MG tablet Take 1 tablet (40 mg total) by mouth daily. 02/11/21  Yes [name], MD  rosuvastatin (CRESTOR) 40 MG tablet Take 40 mg by mouth daily. 02/13/21  Yes [provider]  sertraline (ZOLOFT) 100 MG tablet Take 200 mg by mouth daily. 11/11/20  Yes [provider]  tamsulosin (FLOMAX) 0.4 MG CAPS capsule Take 0.4 mg by mouth daily. 11/11/20  Yes [provider]    Allergies Atorvastatin, Celecoxib, Dicyclomine hcl, Doxycycline, and Metformin  Family History  Problem Relation Age of Onset  . Heart disease  Mother   . Alzheimer's disease Mother   . Heart disease Father   . Diabetes Father     Social History Social History   Tobacco Use  . Smoking status: Former Research scientist (life sciences)  . Smokeless tobacco: Never Used  Substance Use Topics  . Alcohol use: Not Currently  . Drug use: Not Currently    Review of Systems  Constitutional: Negative for fever. + syncope, fatigue Eyes: Negative for visual changes. ENT: Negative for sore throat. Neck: No neck pain  Cardiovascular: Negative for chest pain. Respiratory: Negative for shortness of breath. Gastrointestinal: Negative for abdominal pain, vomiting or diarrhea. Genitourinary: Negative for dysuria. Musculoskeletal: Negative for back pain. Skin: Negative for rash. Neurological: Negative for headaches, weakness or numbness. Psych: No SI or HI  ____________________________________________   PHYSICAL EXAM:  VITAL SIGNS: ED Triage Vitals  Enc Vitals Group     BP 05/06/21 0119 (!) 156/74     Pulse Rate 05/06/21 0119 60     Resp 05/06/21 0119 19     Temp 05/06/21 0119 98.3 F (36.8 C)     Temp Source 05/06/21 0119 Oral     SpO2 05/06/21 0119 100 %     Weight 05/06/21 0120 250 lb (113.4 kg)     Height 05/06/21 0120 6' (1.829 m)     Head Circumference --      Peak Flow --      Pain Score 05/06/21 0120 4     Pain Loc --      Pain Edu? --      Excl. in [place]? --     Constitutional: Alert and oriented. Well appearing and in no apparent distress. HEENT:      Head: Normocephalic and atraumatic.         Eyes: Conjunctivae are normal. Sclera is non-icteric.       Mouth/Throat: Mucous membranes are dry.       Neck: Supple with no signs of meningismus. Cardiovascular: Regular rate and rhythm. No murmurs, gallops, or rubs. 2+ symmetrical distal pulses are present in all extremities. No JVD. Respiratory: Normal respiratory effort. Lungs are clear to auscultation bilaterally.  Gastrointestinal: Soft, non tender, and non distended with positive bowel  sounds. No rebound or guarding. Musculoskeletal:  R big toe amputation with small amount of dry blood. No edema, cyanosis, or erythema of extremities. Neurologic: Normal speech and language. Face is symmetric. Moving all extremities. No gross focal neurologic deficits are appreciated. Skin: Skin is warm, dry and intact. No rash noted. Psychiatric: Mood and affect are normal. Speech and behavior are normal.  ____________________________________________   LABS (all labs ordered are listed, but only abnormal results are displayed)  Labs Reviewed  CBC WITH DIFFERENTIAL/PLATELET - Abnormal; Notable for the following components:      Result Value   RBC 3.29 (*)    Hemoglobin 9.9 (*)    HCT 30.3 (*)    Platelets 126 (*)    All other components within normal limits  COMPREHENSIVE METABOLIC PANEL - Abnormal; Notable for the following components:   Glucose, Bld 160 (*)    BUN 55 (*)    Creatinine, Ser 1.40 (*)    Calcium 8.6 (*)    AST 49 (*)    GFR, Estimated 51 (*)    All other components within normal limits  RESP PANEL BY RT-PCR (FLU A&B, COVID) ARPGX2  URINALYSIS, COMPLETE (UACMP) WITH MICROSCOPIC  TROPONIN I (HIGH SENSITIVITY)  TROPONIN I (HIGH SENSITIVITY)   ____________________________________________  EKG  ED ECG REPORT I, Rudene Re, the attending physician, personally viewed and interpreted this ECG.  Atrial paced rhythm, rate of 60, normal QTC, no concordant ST elevations or depressions. ____________________________________________  RADIOLOGY  I have personally reviewed the images performed during this visit and I agree with the Radiologist's read.   Interpretation by Radiologist:  DG Chest Portable 1 View  Result Date: 05/06/2021 CLINICAL DATA:  Syncope. EXAM: PORTABLE CHEST 1 VIEW COMPARISON:  Chest x-ray 01/13/2021, CT chest 11/15/2020 FINDINGS: Left chest wall 2 lead cardiac pacemaker in similar position. The heart size and mediastinal contours are  within normal limits. Question airspace opacity within the right mid lung zone. No pulmonary edema. No pleural effusion. No pneumothorax. No acute osseous abnormality. Bilateral shoulder degenerative changes. IMPRESSION: Question airspace opacity within the right mid  lung zone. Finding could represent infection/inflammation. Followup PA and lateral chest X-ray is recommended in 3-4 weeks following therapy to ensure resolution and exclude underlying malignancy. Electronically Signed   By: Iven Finn M.D.   On: 05/06/2021 03:32     ____________________________________________   PROCEDURES  Procedure(s) performed:yes .1-3 Lead EKG Interpretation Performed by: Rudene Re, MD Authorized by: Rudene Re, MD     Interpretation: non-specific     ECG rate assessment: normal     Rhythm: paced     Ectopy: none     Critical Care performed:  None ____________________________________________   INITIAL IMPRESSION / ASSESSMENT AND PLAN / ED COURSE   80 y.o. male with a history of CAD on Plavix and aspirin, sick sinus syndrome status post St. Jude's pacemaker, ascending aortic aneurysm, diabetes, peptic ulcer disease, GI bleeding, aortic valve replacement, hypertension, hyperlipidemia, chronic kidney disease who presents for evaluation of syncope.  Patient with a brief episode of loss of consciousness after a syncopal event in the setting of feeling dizzy while he stood up.  Per EMS patient was orthostatic at the scene.  Patient here again orthostatic.  Patient has a heart rate of 60 with a BP of 158/73 laying and that increases to a heart rate of 90 with a BP of 101/66 with standing.  Patient also feels very dizzy.  He looks dry on exam, neurologically intact, abdomen is soft, no respiratory distress, lungs are clear.  Patient is on telemetry with a paced rhythm.  EKG showing paced rhythm with no signs of ischemia or dysrhythmias.  Differential diagnosis includes dehydration, anemia,  electrolyte derangements, sepsis, UTI, COVID, flu, cardiac dysrhythmia.  Will check labs, interrogate pacemaker, get a chest x-ray, urinalysis.  Will give IV fluids.  Old medical records reviewed including patient's most recent echo from 5 weeks ago showing normal EF.  Patient placed on telemetry for close monitoring of cardiorespiratory status.  History gathered from patient and his wife at bedside.  Plan discussed with both of them.    _________________________ 5:52 AM on 05/06/2021 -----------------------------------------  To high-sensitivity troponin negative.  Chest x-ray consistent with pneumonia.  Patient started coughing while in the emergency room.  No signs of sepsis or respiratory failure.  Patient continues to be pretty symptomatic with dizziness when standing up even after 1.5 L of fluid.  Pacemaker was interrogated with no signs of dysrhythmias.  COVID and flu negative.  Patient received IV Rocephin and azithromycin.  Will discuss to hospitalist for admission.      _____________________________________________ Please note:  Patient was evaluated in Emergency Department today for the symptoms described in the history of present illness. Patient was evaluated in the context of the global COVID-19 pandemic, which necessitated consideration that the patient might be at risk for infection with the SARS-CoV-2 virus that causes COVID-19. Institutional protocols and algorithms that pertain to the evaluation of patients at risk for COVID-19 are in a state of rapid change based on information released by regulatory bodies including the CDC and federal and state organizations. These policies and algorithms were followed during the patient's care in the ED.  Some ED evaluations and interventions may be delayed as a result of limited staffing during the pandemic.   [place] Controlled Substance Database was reviewed by me. ____________________________________________   FINAL CLINICAL IMPRESSION(S) /  ED DIAGNOSES   Final diagnoses:  Syncope, unspecified syncope type  Community acquired pneumonia, unspecified laterality      NEW MEDICATIONS STARTED DURING THIS VISIT:  ED  Discharge Orders    None       Note:  This document was prepared using Dragon voice recognition software and may include unintentional dictation errors.    Alfred Levins, [place], MD 05/06/21 [id number]

## 2021-05-06 NOTE — ED Notes (Signed)
Transport at bedside  

## 2021-05-06 NOTE — ED Triage Notes (Signed)
Pt presents to ER via ems after syncopal episode at home.  Pt was walking to restroom and began to feel lightheaded and passed out.  EMS states they performed orthostatic VS and found that his BP went from [id number] systolic to [id number] systolic going from sitting to standing.  Pt currently A&O x4 at this time and is c/o pain in his right foot, where he had his big toe amputated.  Toe is bleeding, but it is controlled at this time.

## 2021-05-06 NOTE — Consult Note (Signed)
[facility] Nurse wound consult note Consultation was completed by review of records, images and assistance from the bedside nurse/clinical staff.   Reason for Consult:great toe amputation site. S/P right great toe 03/20/21; per Dr. Cleda Mccreedy Wound type: surgical  Pressure Injury POA: NA Measurement: See nursing flow sheets; however each site (ampuation and 2nd toe) appear to be less than 2cm in diameter; no depth Wound [id number] clean, pink, no s/s of infection Drainage (amount, consistency, odor) serosanguinous on dressing Periwound:intact, old blood Dressing procedure/placement/frequency: Single layer of antibacterial non adherent layer (xeroform) topped with dry gauze and kerlix. Change every other day Patient to follow up with Dr. Cleda Mccreedy as scheduled.    Re consult if needed, will not follow at this time. Thanks  Breylen Agyeman [employer], RN,CWOCN, CNS, [place] [phone])

## 2021-05-06 NOTE — Evaluation (Signed)
Physical Therapy Evaluation Patient Details Name: Nathan Singleton MRN: [id number] DOB: 05-14-1941 Today's Date: 05/06/2021   History of Present Illness  Patient is a 80 y.o. male with a history of CAD on Plavix and aspirin, sick sinus syndrome status post [employer] pacemaker, ascending aortic aneurysm, diabetes, peptic ulcer disease, GI bleeding, aortic valve replacement, hypertension, hyperlipidemia, chronic kidney disease who presents for evaluation of syncope.    Clinical Impression  Patient agreeable to PT evaluation. He is very hard of hearing and hears best from left ear. Patient reports multiple falls over the past 6 months and difficulty with balance since recent right toe amputation. Patient also reports feeling generalized weakness with mobility with standing activity tolerance limited to ~ 2 minutes during this evaluation. Patient able to stand and march in place with assistance. No dizziness reported with standing and no significant change  noted with orthostatic vitals. Recommend to continue PT to maximize independence and reduce risk for falls. Consider SNF placement for short term rehab before going home. Recommend HHPT at a minimum with supervision for OOB activity.      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 per the patient report, he is no longer required to wear the off loading shoe      Mobility  Bed Mobility Overal bed mobility: Needs Assistance Bed Mobility: Supine to Sit;Sit to Supine     Supine to sit: Min assist Sit to supine: Min guard   General bed mobility comments: increased time required  and cues for sequencing    Transfers Overall transfer level: Needs assistance Equipment used: 2 person hand held assist;Rolling walker (2 wheeled) Transfers: Sit to/from Stand Sit to Stand: Min guard;Min assist         General transfer comment: without rolling walker, patient required Min A for lifting to stand. Min guard with use of rolling walker. difficulty following commands for hand placement  Ambulation/Gait             General Gait Details: unable to progress ambulation due to poor standing tolerance. patient able to stand less than 2 minutes before feeling "weak". othostatic vitals taken during session without significant change in blood pressure noted. see vitals section for details  Stairs            Wheelchair Mobility    Modified Rankin (Stroke Patients Only)       Balance Overall balance assessment: Needs assistance Sitting-balance support: No upper extremity supported;Feet supported Sitting balance-Leahy Scale: Fair Sitting balance - Comments: Fair sitting balance at EOB while reaching within BOS   Standing balance support: Bilateral upper extremity supported;During functional activity Standing balance-Leahy Scale: Poor Standing balance comment: BUE support required and steadying assistance needed for dynamic activity/marching                             Pertinent Vitals/Pain Pain Assessment: No/denies pain    Home Living Family/patient expects to be discharged to:: Private residence Living Arrangements: Spouse/significant other Available Help at Discharge: Family;Available 24 hours/day Type of Home: House Home Access: Stairs to enter   [employer] of Steps: 1 partial step Home Layout: One  level Home Equipment: Walker - 2 wheels;Bedside commode;Cane - single point Additional Comments: Pt reports BSC is too narrow    Prior Function Level of Independence: Independent with assistive device(s);Needs assistance   Gait /  Transfers Assistance Needed: Mod I with functional mobility per patient report with use of rolling walker or cane with mobility. patient reports at least 3 falls in the past 6 months, but this number could he higher.  ADL's / Homemaking Assistance Needed: spouse assists with lower body dressing/bathing        Hand Dominance   Dominant Hand: Right    Extremity/Trunk Assessment   Upper Extremity Assessment Upper Extremity Assessment: Overall WFL for tasks assessed LUE Deficits / Details: Since fall a few months ago, limted AROM of LUE; shoulder flexion 0-80, elbow flexion 0-100, grip strength WFL. 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 patient reports feeling weak with mobility   Assessment/Plan    PT Assessment Patient needs continued PT services  PT Problem List Decreased strength;Decreased range of motion;Decreased activity tolerance;Decreased balance;Decreased mobility;Decreased safety awareness;Decreased knowledge of precautions;Decreased knowledge of use of DME       PT Treatment Interventions DME instruction;Gait training;Stair training;Functional mobility training;Therapeutic activities;Therapeutic exercise;Neuromuscular re-education;Balance training;Patient/family education    PT Goals (Current goals can be found in the Care Plan section)  Acute Rehab PT Goals Patient Stated  Goal: to get stronger PT Goal Formulation: With patient Time For Goal Achievement: 05/20/21 Potential to Achieve Goals: Good    Frequency Min 2X/week   Barriers to discharge        Co-evaluation PT/OT/SLP Co-Evaluation/Treatment: Yes Reason for Co-Treatment: Complexity of the patient's impairments (multi-system involvement);To address functional/ADL transfers PT goals addressed during session: Mobility/safety with mobility OT goals addressed during session: ADL's and self-care       AM-PAC PT "6 Clicks" Mobility  Outcome Measure Help needed turning from your back to your side while in a flat bed without using bedrails?: A Little Help needed moving from lying on your back to sitting on the side of a flat bed without using bedrails?: A Little Help needed moving to and from a bed to a chair (including a wheelchair)?: A Little Help needed standing up from a chair using your arms (e.g., wheelchair or bedside chair)?: A Little Help needed to walk in hospital room?: A Little Help needed climbing 3-5 steps with a railing? : A Lot 6 Click Score: 17    End of Session   Activity Tolerance: Patient tolerated treatment well Patient left: in bed;with call bell/phone within reach Nurse Communication: Mobility status PT Visit Diagnosis: Unsteadiness on feet (R26.81);History of falling (Z91.81);Muscle weakness (generalized) (M62.81)    Time: [id number] PT Time Calculation (min) (ACUTE ONLY): 27 min   Charges:   PT Evaluation $PT Eval Moderate Complexity: 1  Mod PT Treatments $Therapeutic Activity: 8-22 mins        Minna Merritts, PT, MPT   Nathan Singleton 05/06/2021, 4:18 PM

## 2021-05-06 NOTE — ED Notes (Signed)
Primofit placed by this RN. Pt repositioned in bed by this RN at this time, pt provided meal tray by this RN at this time.

## 2021-05-06 NOTE — ED Notes (Signed)
Transport requested

## 2021-05-07 ENCOUNTER — Inpatient Hospital Stay: Payer: 59

## 2021-05-07 DIAGNOSIS — R55 Syncope and collapse: Secondary | ICD-10-CM | POA: Diagnosis not present

## 2021-05-07 DIAGNOSIS — I951 Orthostatic hypotension: Principal | ICD-10-CM

## 2021-05-07 DIAGNOSIS — L03031 Cellulitis of right toe: Secondary | ICD-10-CM

## 2021-05-07 DIAGNOSIS — R531 Weakness: Secondary | ICD-10-CM | POA: Diagnosis not present

## 2021-05-07 DIAGNOSIS — F32A Depression, unspecified: Secondary | ICD-10-CM

## 2021-05-07 DIAGNOSIS — D696 Thrombocytopenia, unspecified: Secondary | ICD-10-CM

## 2021-05-07 LAB — GLUCOSE, CAPILLARY
Glucose-Capillary: 136 mg/dL — ABNORMAL HIGH (ref 70–99)
Glucose-Capillary: 257 mg/dL — ABNORMAL HIGH (ref 70–99)
Glucose-Capillary: 231 mg/dL — ABNORMAL HIGH (ref 70–99)
Glucose-Capillary: 213 mg/dL — ABNORMAL HIGH (ref 70–99)

## 2021-05-07 LAB — C DIFFICILE QUICK SCREEN W PCR REFLEX
C Diff antigen: NEGATIVE
C Diff interpretation: NOT DETECTED
C Diff toxin: NEGATIVE

## 2021-05-07 LAB — CBC
HCT: 27.1 % — ABNORMAL LOW (ref 39.0–52.0)
Hemoglobin: 9.1 g/dL — ABNORMAL LOW (ref 13.0–17.0)
MCH: 30 pg (ref 26.0–34.0)
MCHC: 33.6 g/dL (ref 30.0–36.0)
MCV: 89.4 fL (ref 80.0–100.0)
Platelets: 113 10*3/uL — ABNORMAL LOW (ref 150–400)
RBC: 3.03 MIL/uL — ABNORMAL LOW (ref 4.22–5.81)
RDW: 14.9 % (ref 11.5–15.5)
WBC: 3.7 10*3/uL — ABNORMAL LOW (ref 4.0–10.5)
nRBC: 0 % (ref 0.0–0.2)

## 2021-05-07 LAB — URINALYSIS, COMPLETE (UACMP) WITH MICROSCOPIC
Bacteria, UA: NONE SEEN
Bilirubin Urine: NEGATIVE
Glucose, UA: >=500 mg/dL — AB
Hgb urine dipstick: NEGATIVE
Ketones, ur: NEGATIVE mg/dL
Leukocytes,Ua: NEGATIVE
Nitrite: NEGATIVE
Protein, ur: 30 mg/dL — AB
Specific Gravity, Urine: 1.009 (ref 1.005–1.030)
Squamous Epithelial / HPF: NONE SEEN (ref 0–5)
WBC, UA: NONE SEEN WBC/hpf (ref 0–5)
pH: 5 (ref 5.0–8.0)

## 2021-05-07 LAB — GASTROINTESTINAL PANEL BY PCR, STOOL (REPLACES STOOL CULTURE)

## 2021-05-07 LAB — SEDIMENTATION RATE: Sed Rate: 68 mm/hr — ABNORMAL HIGH (ref 0–20)

## 2021-05-07 MED ORDER — ALUM & MAG HYDROXIDE-SIMETH 200-200-20 MG/5ML PO SUSP
30.0000 mL | ORAL | Status: DC | PRN
Start: 2021-05-07 — End: 2021-05-09
  Administered 2021-05-07 – 2021-05-08 (×3): 30 mL via ORAL
  Filled 2021-05-07 (×3): qty 30

## 2021-05-07 MED ORDER — VANCOMYCIN HCL 2000 MG/400ML IV SOLN
2000.0000 mg | Freq: Once | INTRAVENOUS | Status: AC
  Administered 2021-05-07: 2000 mg via INTRAVENOUS
  Filled 2021-05-07: qty 400

## 2021-05-07 MED ORDER — SODIUM CHLORIDE 0.9 % IV SOLN
2.0000 g | Freq: Every day | INTRAVENOUS | Status: DC
  Administered 2021-05-07 – 2021-05-09 (×3): 2 g via INTRAVENOUS
  Filled 2021-05-07: qty 2
  Filled 2021-05-07 (×3): qty 20

## 2021-05-07 MED ORDER — VANCOMYCIN HCL 1250 MG/250ML IV SOLN
1250.0000 mg | INTRAVENOUS | Status: DC
  Administered 2021-05-08: 1250 mg via INTRAVENOUS
  Filled 2021-05-07 (×2): qty 250

## 2021-05-07 NOTE — TOC Progression Note (Addendum)
Transition of Care [facility]) - Progression Note    Patient Details  Name: Nathan Singleton MRN: [id number] Date of Birth: May 22, 1941  Transition of [facility]) CM/SW [place], LCSW Phone Number: 05/07/2021, 2:17 PM  Clinical Narrative: Uploaded requested documents into [place] [employer] for PASARR review.    3:09 pm: PASARR obtained: [id number] E. Expires 06/06/21.  Expected Discharge Plan: [place] Barriers to Discharge: Continued Medical Work up  Expected Discharge Plan and Services Expected Discharge Plan: [place] arrangements for the past 2 months: Single Family Home                           HH Arranged: RN [facility] Agency: [place] Date [facility] Agency Contacted: 05/07/21 Time HH Agency Contacted: 0900 Representative spoke with at [place]: [place] Determinants of Health ([place]) Interventions    Readmission Risk Interventions Readmission Risk Prevention Plan 05/07/2021 03/23/2021 02/14/2021  Transportation Screening Complete Complete Complete  Medication Review [employer]) Complete Complete Complete  PCP or Specialist appointment within 3-5 days of discharge Complete Complete Complete  HRI or Home Care Consult Complete Complete Complete  SW Recovery Care/Counseling Consult Complete Complete Complete  Palliative Care Screening Not Applicable Not Applicable Complete  [place] Not Applicable Not Applicable Complete

## 2021-05-07 NOTE — Consult Note (Signed)
PODIATRY / FOOT AND ANKLE SURGERY CONSULTATION NOTE  Requesting Physician: Dr. Leslye Peer  Reason for consult: Right foot wound/callus  Chief Complaint: Right foot wounds   HPI: Nathan Singleton is a 80 y.o. male who presented to [facility] due to syncopal episode.  Patient is being worked up for this and was subsequently admitted.  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  Patient had the amputation performed around a month and a half ago by Dr. Cleda Mccreedy who has followed up with him as an outpatient.  Patient is also noticed that there is an area of bladder hematoma type formation to the distal aspect of the right hallux amputation site and to the distal aspect of the right second toe.  They have also noted some discoloration to the PIPJ laterally of the right fifth toe.  Patient presents resting in bed comfortably and is not complaining of any pain or discomfort.  PMHx:  Past Medical History:  Diagnosis Date  . Aneurysm (arteriovenous) of coronary vessels   . Arthritis   . Ascending aortic aneurysm ([place])   . CAD (coronary artery disease)   . Diabetes mellitus without complication ([place])   . History of GI bleed   . Hyperlipidemia associated with type 2 diabetes mellitus ([place])   . Hypertension associated with diabetes ([place])   . Presence of permanent cardiac pacemaker   . PVD (peripheral vascular disease) ([place])   . S/P TAVR (transcatheter aortic valve replacement)     Surgical Hx:  Past Surgical History:  Procedure Laterality Date  . AMPUTATION TOE Right 03/20/2021   Procedure: AMPUTATION TOE;  Surgeon: Sharlotte Alamo, DPM;  Location: [facility] ORS;  Service: Podiatry;  Laterality: Right;  . Ankle repaired Right   . Carpal Tunnel repaired Left   . CATARACT EXTRACTION Bilateral   . COLONOSCOPY    . Copillar implant     . DG CHOLECYSTOGRAPHY GALL BLADDER ([place] HX)    . GASTRIC BYPASS    . heart  valve replaced    . IRRIGATION AND DEBRIDEMENT FOOT Right 02/18/2021   Procedure: IRRIGATION AND DEBRIDEMENT FOOT-Right Great Toe;  Surgeon: [name], DPM;  Location: [facility] ORS;  Service: Podiatry;  Laterality: Right;  . LOWER EXTREMITY ANGIOGRAPHY Right 02/17/2021   Procedure: Lower Extremity Angiography;  Surgeon: Algernon Huxley, MD;  Location: [place] [facility] LAB;  Service: Cardiovascular;  Laterality: Right;  . LOWER EXTREMITY ANGIOGRAPHY Right 03/27/2021   Procedure: Lower Extremity Angiography;  Surgeon: Algernon Huxley, MD;  Location: [place] [facility];  Service: Cardiovascular;  Laterality: Right;  . PACEMAKER GENERATOR CHANGE    . REPLACEMENT TOTAL KNEE BILATERAL    . RTC    . STENT PLACE LEFT URETER ([facility] HX) Right    Right Leg  . TEE WITHOUT CARDIOVERSION N/A 03/26/2021   Procedure: TRANSESOPHAGEAL ECHOCARDIOGRAM (TEE);  Surgeon: Kate Sable, MD;  Location: [facility] ORS;  Service: Cardiovascular;  Laterality: N/A;  . Toe nail removed Bilateral     FHx:  Family History  Problem Relation Age of Onset  . Heart disease Mother   . Alzheimer's disease Mother   . Heart disease Father   . Diabetes Father     Social History:  reports that he has quit smoking. He has never used smokeless tobacco. He reports previous alcohol use. He reports previous drug use.  Allergies:  Allergies  Allergen Reactions  . Atorvastatin  Other (See Comments)    "bad for kidneys" Cramping   . Celecoxib Other (See Comments)    Other reaction(s): Other (See Comments) Kidney Problem Kidney Problem   . Dicyclomine Hcl Other (See Comments)    Stomach cramps  . Doxycycline Other (See Comments)    Per wife (per RN)- pt gets severe stomach pains even when takes with food and PCP said to not take    . Metformin Other (See Comments)    Medications Prior to Admission  Medication Sig Dispense Refill  . allopurinol (ZYLOPRIM) 100 MG tablet Take 100 mg by mouth daily.    Marland Kitchen amLODipine (NORVASC) 5 MG  tablet Take 1 tablet (5 mg total) by mouth daily. 30 tablet 0  . baclofen (LIORESAL) 10 MG tablet Take 10 mg by mouth 2 (two) times daily.    Marland Kitchen BAYER ASPIRIN EC LOW DOSE 81 MG EC tablet Take 81 mg by mouth daily.    . carvedilol (COREG) 6.25 MG tablet Take 6.25 mg by mouth at bedtime.    . cephALEXin (KEFLEX) 500 MG capsule Take 500 mg by mouth 4 (four) times daily.    . cetirizine (ZYRTEC) 10 MG tablet Take 10 mg by mouth daily.    . clopidogrel (PLAVIX) 75 MG tablet Take 1 tablet (75 mg total) by mouth daily. 60 tablet 3  . D-5000 125 MCG (5000 UT) TABS Take 1 tablet by mouth daily.    Marland Kitchen docusate sodium (COLACE) 100 MG capsule Take 200 mg by mouth 2 (two) times daily.    . furosemide (LASIX) 20 MG tablet Take 20 mg by mouth daily.    Marland Kitchen gabapentin (NEURONTIN) 100 MG capsule Take 100 mg by mouth daily.    . insulin glargine (LANTUS) 100 UNIT/ML Solostar Pen Inject 22 Units into the skin at bedtime. 15 mL 11  . isosorbide mononitrate (IMDUR) 30 MG 24 hr tablet Take 30 mg by mouth daily.    Marland Kitchen LINZESS 145 MCG CAPS capsule Take 145 mcg by mouth daily.    Marland Kitchen loperamide (IMODIUM) 2 MG capsule Take 1 capsule (2 mg total) by mouth as needed for diarrhea or loose stools. 30 capsule 0  . multivitamin (ONE-A-DAY MEN'S) TABS tablet Take 1 tablet by mouth daily.    . pantoprazole (PROTONIX) 40 MG tablet Take 1 tablet (40 mg total) by mouth daily. 90 tablet 1  . rosuvastatin (CRESTOR) 40 MG tablet Take 40 mg by mouth daily.    . sertraline (ZOLOFT) 100 MG tablet Take 200 mg by mouth daily.    . tamsulosin (FLOMAX) 0.4 MG CAPS capsule Take 0.4 mg by mouth daily.      Physical Exam: General: Alert and oriented.  No apparent distress.  Vascular: DP/PT pulses faintly palpable bilateral.  Feet do appear to be warm to touch bilateral.  No hair growth noted to digits bilateral.  Neuro: Light touch sensation reduced to bilateral lower extremities.  Derm: Hemorrhagic type formation/callus to the distal aspect  of the right hallux amputation site, once removed it did reveal a dermal type of ulceration which measures approximately 0.4 x 0.3 cm and only went to the depth of dermal tissue.  No associated erythema or edema, no drainage, no signs of infection, no deeper probing, no bone exposed.  Hemorrhagic callus formation noted to the distal aspect of the right second toe, once removed revealed a subcutaneous ulceration which measures approximately 0.4 x 0.3 x 0.2 cm and probes to only the subcutaneous tissue with no  deeper, no bone exposed.  No erythema, no edema, no drainage, no signs of infection present.  Skin appears to be fairly thin and atrophic to both feet.  Small stable hemorrhagic callus formation to the dorsal lateral aspect of the PIPJ of the right fifth toe.  No signs of infection present.  No other open wounds or ulcerations present to bilateral lower extremities.  MSK: Right hallux amputation.  No pain on palpation of poor forefeet.  Results for orders placed or performed during the hospital encounter of 05/06/21 (from the past 48 hour(s))  CBC with Differential     Status: Abnormal   Collection Time: 05/06/21  1:50 AM  Result Value Ref Range   WBC 4.5 4.0 - 10.5 K/uL   RBC 3.29 (L) 4.22 - 5.81 MIL/uL   Hemoglobin 9.9 (L) 13.0 - 17.0 g/dL   HCT 30.3 (L) 39.0 - 52.0 %   MCV 92.1 80.0 - 100.0 fL   MCH 30.1 26.0 - 34.0 pg   MCHC 32.7 30.0 - 36.0 g/dL   RDW 14.9 11.5 - 15.5 %   Platelets 126 (L) 150 - 400 K/uL    Comment: Immature Platelet Fraction may be clinically indicated, consider ordering this additional test [id number]    nRBC 0.0 0.0 - 0.2 %   Neutrophils Relative % 67 %   Neutro Abs 3.1 1.7 - 7.7 K/uL   Lymphocytes Relative 18 %   Lymphs Abs 0.8 0.7 - 4.0 K/uL   Monocytes Relative 6 %   Monocytes Absolute 0.3 0.1 - 1.0 K/uL   Eosinophils Relative 8 %   Eosinophils Absolute 0.3 0.0 - 0.5 K/uL   Basophils Relative 1 %   Basophils Absolute 0.0 0.0 - 0.1 K/uL   Immature  Granulocytes 0 %   Abs Immature Granulocytes 0.02 0.00 - 0.07 K/uL    Comment: Performed at [facility], [address] [place], [place] [address]  Comprehensive metabolic panel     Status: Abnormal   Collection Time: 05/06/21  1:50 AM  Result Value Ref Range   Sodium 139 135 - 145 mmol/L   Potassium 4.7 3.5 - 5.1 mmol/L   Chloride 110 98 - 111 mmol/L   CO2 22 22 - 32 mmol/L   Glucose, Bld 160 (H) 70 - 99 mg/dL    Comment: Glucose reference range applies only to samples taken after fasting for at least 8 hours.   BUN 55 (H) 8 - 23 mg/dL   Creatinine, Ser 1.40 (H) 0.61 - 1.24 mg/dL   Calcium 8.6 (L) 8.9 - 10.3 mg/dL   Total Protein 7.5 6.5 - 8.1 g/dL   Albumin 3.8 3.5 - 5.0 g/dL   AST 49 (H) 15 - 41 U/L   ALT 30 0 - 44 U/L   Alkaline Phosphatase 106 38 - 126 U/L   Total Bilirubin 0.5 0.3 - 1.2 mg/dL   GFR, Estimated 51 (L) >60 mL/min    Comment: (NOTE) Calculated using the CKD-EPI Creatinine Equation (2021)    Anion gap 7 5 - 15    Comment: Performed at [facility], [place], [place] [address]  Troponin I (High Sensitivity)     Status: None   Collection Time: 05/06/21  1:50 AM  Result Value Ref Range   Troponin I (High Sensitivity) 13 <18 ng/L    Comment: (NOTE) Elevated high sensitivity troponin I (hsTnI) values and significant  changes across serial measurements may suggest ACS but many other  chronic and  acute conditions are known to elevate hsTnI results.  Refer to the "Links" section for chest pain algorithms and additional  guidance. Performed at [facility], [address] [place], [place] [address]   Brain natriuretic peptide     Status: Abnormal   Collection Time: 05/06/21  1:50 AM  Result Value Ref Range   B Natriuretic Peptide 118.7 (H) 0.0 - 100.0 pg/mL    Comment: Performed at [facility], [address] [place], [place] [address]  Resp Panel by RT-PCR (Flu A&B, Covid) Nasopharyngeal Swab      Status: None   Collection Time: 05/06/21  2:19 AM   Specimen: Nasopharyngeal Swab; Nasopharyngeal(NP) swabs in vial transport medium  Result Value Ref Range   SARS Coronavirus 2 by RT PCR NEGATIVE NEGATIVE    Comment: (NOTE) SARS-CoV-2 target nucleic acids are NOT DETECTED.  The SARS-CoV-2 RNA is generally detectable in upper respiratory specimens during the acute phase of infection. The lowest concentration of SARS-CoV-2 viral copies this assay can detect is 138 copies/mL. A negative result does not preclude SARS-Cov-2 infection and should not be used as the sole basis for treatment or other patient management decisions. A negative result may occur with  improper specimen collection/handling, submission of specimen other than nasopharyngeal swab, presence of viral mutation(s) within the areas targeted by this assay, and inadequate number of viral copies(<138 copies/mL). A negative result must be combined with clinical observations, patient history, and epidemiological information. The expected result is Negative.  Fact Sheet for Patients:  EntrepreneurPulse.com.au  Fact Sheet for Healthcare Providers:  IncredibleEmployment.be  This test is no t yet approved or cleared by the [place] FDA and  has been authorized for detection and/or diagnosis of SARS-CoV-2 by FDA under an Emergency Use Authorization (EUA). This EUA will remain  in effect (meaning this test can be used) for the duration of the COVID-19 declaration under Section 564(b)(1) of the Act, 21 U.S.C.section 360bbb-3(b)(1), unless the authorization is terminated  or revoked sooner.       Influenza A by PCR NEGATIVE NEGATIVE   Influenza B by PCR NEGATIVE NEGATIVE    Comment: (NOTE) The Xpert Xpress SARS-CoV-2/FLU/RSV plus assay is intended as an aid in the diagnosis of influenza from Nasopharyngeal swab specimens and should not be used as a sole basis for treatment. Nasal  washings and aspirates are unacceptable for Xpert Xpress SARS-CoV-2/FLU/RSV testing.  Fact Sheet for Patients: EntrepreneurPulse.com.au  Fact Sheet for Healthcare Providers: IncredibleEmployment.be  This test is not yet approved or cleared by the [place] FDA and has been authorized for detection and/or diagnosis of SARS-CoV-2 by FDA under an Emergency Use Authorization (EUA). This EUA will remain in effect (meaning this test can be used) for the duration of the COVID-19 declaration under Section 564(b)(1) of the Act, 21 U.S.C. section 360bbb-3(b)(1), unless the authorization is terminated or revoked.  Performed at [facility], [place], [place] [address]   Troponin I (High Sensitivity)     Status: None   Collection Time: 05/06/21  3:57 AM  Result Value Ref Range   Troponin I (High Sensitivity) 12 <18 ng/L    Comment: (NOTE) Elevated high sensitivity troponin I (hsTnI) values and significant  changes across serial measurements may suggest ACS but many other  chronic and acute conditions are known to elevate hsTnI results.  Refer to the "Links" section for chest pain algorithms and additional  guidance. Performed at [facility], [address] [place],  [place] [address]   Procalcitonin - Baseline     Status: None   Collection Time: 05/06/21  3:57 AM  Result Value Ref Range   Procalcitonin <0.10 ng/mL    Comment:        Interpretation: PCT (Procalcitonin) <= 0.5 ng/mL: Systemic infection (sepsis) is not likely. Local bacterial infection is possible. (NOTE)       Sepsis PCT Algorithm           Lower Respiratory Tract                                      Infection PCT Algorithm    ----------------------------     ----------------------------         PCT < 0.25 ng/mL                PCT < 0.10 ng/mL          Strongly encourage             Strongly discourage   discontinuation of antibiotics     initiation of antibiotics    ----------------------------     -----------------------------       PCT 0.25 - 0.50 ng/mL            PCT 0.10 - 0.25 ng/mL               OR       >80% decrease in PCT            Discourage initiation of                                            antibiotics      Encourage discontinuation           of antibiotics    ----------------------------     -----------------------------         PCT >= 0.50 ng/mL              PCT 0.26 - 0.50 ng/mL               AND        <80% decrease in PCT             Encourage initiation of                                             antibiotics       Encourage continuation           of antibiotics    ----------------------------     -----------------------------        PCT >= 0.50 ng/mL                  PCT > 0.50 ng/mL               AND         increase in PCT                  Strongly encourage  initiation of antibiotics    Strongly encourage escalation           of antibiotics                                     -----------------------------                                           PCT <= 0.25 ng/mL                                                 OR                                        > 80% decrease in PCT                                      Discontinue / Do not initiate                                             antibiotics  Performed at [facility], [address] [place], [place] [address]   Glucose, capillary     Status: Abnormal   Collection Time: 05/06/21  4:41 PM  Result Value Ref Range   Glucose-Capillary 209 (H) 70 - 99 mg/dL    Comment: Glucose reference range applies only to samples taken after fasting for at least 8 hours.   Comment 1 Notify RN   Glucose, capillary     Status: Abnormal   Collection Time: 05/06/21  9:50 PM  Result Value Ref Range   Glucose-Capillary 277 (H) 70 - 99 mg/dL    Comment: Glucose reference range applies only to samples  taken after fasting for at least 8 hours.   Comment 1 Notify RN   C Difficile Quick Screen w PCR reflex     Status: None   Collection Time: 05/06/21 11:29 PM   Specimen: STOOL  Result Value Ref Range   C Diff antigen NEGATIVE NEGATIVE   C Diff toxin NEGATIVE NEGATIVE   C Diff interpretation No C. difficile detected.     Comment: Performed at [facility], [address] [place],  [address]  Gastrointestinal Panel by PCR , Stool     Status: None   Collection Time: 05/06/21 11:29 PM   Specimen: Stool  Result Value Ref Range   Campylobacter species NOT DETECTED NOT DETECTED   Plesimonas shigelloides NOT DETECTED NOT DETECTED   Salmonella species NOT DETECTED NOT DETECTED   Yersinia enterocolitica NOT DETECTED NOT DETECTED   Vibrio species NOT DETECTED NOT DETECTED   Vibrio cholerae NOT DETECTED NOT DETECTED   Enteroaggregative E coli (EAEC) NOT DETECTED NOT DETECTED   Enteropathogenic E coli (EPEC) NOT DETECTED NOT DETECTED   Enterotoxigenic E coli (ETEC) NOT DETECTED NOT DETECTED   Shiga like toxin producing E coli (STEC) NOT  DETECTED NOT DETECTED   Shigella/Enteroinvasive E coli (EIEC) NOT DETECTED NOT DETECTED   Cryptosporidium NOT DETECTED NOT DETECTED   Cyclospora cayetanensis NOT DETECTED NOT DETECTED   Entamoeba histolytica NOT DETECTED NOT DETECTED   Giardia lamblia NOT DETECTED NOT DETECTED   Adenovirus F40/41 NOT DETECTED NOT DETECTED   Astrovirus NOT DETECTED NOT DETECTED   Norovirus GI/GII NOT DETECTED NOT DETECTED   Rotavirus A NOT DETECTED NOT DETECTED   Sapovirus (I, II, IV, and V) NOT DETECTED NOT DETECTED    Comment: Performed at [facility], [address] [place], [place] [address]  Urinalysis, Complete w Microscopic     Status: Abnormal   Collection Time: 05/07/21  1:41 AM  Result Value Ref Range   Color, Urine STRAW (A) YELLOW   APPearance CLEAR (A) CLEAR   Specific Gravity, Urine 1.009 1.005 - 1.030   pH 5.0 5.0 - 8.0    Glucose, UA >=500 (A) NEGATIVE mg/dL   Hgb urine dipstick NEGATIVE NEGATIVE   Bilirubin Urine NEGATIVE NEGATIVE   Ketones, ur NEGATIVE NEGATIVE mg/dL   Protein, ur 30 (A) NEGATIVE mg/dL   Nitrite NEGATIVE NEGATIVE   Leukocytes,Ua NEGATIVE NEGATIVE   WBC, UA NONE SEEN 0 - 5 WBC/hpf   Bacteria, UA NONE SEEN NONE SEEN   Squamous Epithelial / LPF NONE SEEN 0 - 5   Mucus PRESENT     Comment: Performed at [facility], [address] [place], [place] [address]  CBC     Status: Abnormal   Collection Time: 05/07/21  5:44 AM  Result Value Ref Range   WBC 3.7 (L) 4.0 - 10.5 K/uL   RBC 3.03 (L) 4.22 - 5.81 MIL/uL   Hemoglobin 9.1 (L) 13.0 - 17.0 g/dL   HCT 27.1 (L) 39.0 - 52.0 %   MCV 89.4 80.0 - 100.0 fL   MCH 30.0 26.0 - 34.0 pg   MCHC 33.6 30.0 - 36.0 g/dL   RDW 14.9 11.5 - 15.5 %   Platelets 113 (L) 150 - 400 K/uL    Comment: Immature Platelet Fraction may be clinically indicated, consider ordering this additional test [id number]    nRBC 0.0 0.0 - 0.2 %    Comment: Performed at [facility], [address] [place], [place] [address]  Glucose, capillary     Status: Abnormal   Collection Time: 05/07/21  7:38 AM  Result Value Ref Range   Glucose-Capillary 136 (H) 70 - 99 mg/dL    Comment: Glucose reference range applies only to samples taken after fasting for at least 8 hours.  Glucose, capillary     Status: Abnormal   Collection Time: 05/07/21 11:33 AM  Result Value Ref Range   Glucose-Capillary 257 (H) 70 - 99 mg/dL    Comment: Glucose reference range applies only to samples taken after fasting for at least 8 hours.   Comment 1 Notify RN   ESR     Status: Abnormal   Collection Time: 05/07/21 11:38 AM  Result Value Ref Range   Sed Rate 68 (H) 0 - 20 mm/hr    Comment: Performed at [facility], [address] [place], [place] [address]  Glucose, capillary     Status: Abnormal   Collection Time: 05/07/21  4:30 PM  Result Value Ref Range    Glucose-Capillary 231 (H) 70 - 99 mg/dL    Comment: Glucose reference range applies only to samples taken after fasting for at least 8 hours.   Comment 1  Notify RN    CT HEAD WO CONTRAST  Result Date: 05/06/2021 CLINICAL DATA:  Syncopal episode EXAM: CT HEAD WITHOUT CONTRAST TECHNIQUE: Contiguous axial images were obtained from the base of the skull through the vertex without intravenous contrast. COMPARISON:  12/31/2020 FINDINGS: Brain: Stable mild atrophy pattern without acute intracranial hemorrhage, definite mass lesion, new infarction hemorrhage, midline shift, herniation, hydrocephalus, or extra-axial fluid collection. No focal mass effect or edema. 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 Cisterns are patent.  Cerebellar atrophy as well. Vascular: Intracranial atherosclerosis at the skull base. No hyperdense vessel. Skull: Postop changes of the left mastoid. Mastoids are clear. No acute osseous finding. Sinuses/Orbits: No acute finding. Other: None. IMPRESSION: Stable atrophy pattern without acute intracranial abnormality by noncontrast CT. Limited by artifact from a left cochlear implant. Electronically Signed   By: Judie Petit.  Shick M.D.   On: 05/06/2021 10:15   CT FOOT RIGHT WO CONTRAST  Result Date: 05/07/2021 CLINICAL DATA:  Foot swelling, diabetic, osteomyelitis suspected, xray done EXAM: CT OF THE RIGHT FOOT WITHOUT CONTRAST TECHNIQUE: Multidetector CT imaging of the right foot was performed according to the standard protocol. Multiplanar CT image reconstructions were also generated. COMPARISON:  Foot radiograph 05/07/2021 FINDINGS: Bones/Joint/Cartilage Prior great toe amputation. There is a cortical defect along the medial distal aspect of the residual first metatarsal (axial bone images 36 and 37 there are no other areas of cortical erosion/osseous destruction. There is no acute fracture identified. There is mild to moderate degenerative change involving the tibiotalar, middle subtalar,  calcaneocuboid, talonavicular, and naviculocuneiform joints. Mild medial-sided tarsometatarsal joint and scattered interphalangeal joint degenerative change. Ligaments Suboptimally assessed by CT. Muscles and Tendons Mild muscle atrophy in the foot. Tendon other than the great toe flexor and extensors are unremarkable on noncontrast CT. Soft tissues Diffuse soft tissue swelling of the foot. IMPRESSION: Prior great toe amputation, with small cortical defect within the distal medial aspect of the residual first metatarsal which could represent small foci of osteomyelitis or postsurgical change. Additional small cortical defect at the distal tuft of the second toe which could be foci of osteomyelitis. MRI would be more sensitive. Diffuse foot soft tissue swelling. Electronically Signed   By: Caprice Renshaw   On: 05/07/2021 14:06   DG Chest Portable 1 View  Result Date: 05/06/2021 CLINICAL DATA:  Syncope. EXAM: PORTABLE CHEST 1 VIEW COMPARISON:  Chest x-ray 01/13/2021, CT chest 11/15/2020 FINDINGS: Left chest wall 2 lead cardiac pacemaker in similar position. The heart size and mediastinal contours are within normal limits. Question airspace opacity within the right mid lung zone. No pulmonary edema. No pleural effusion. No pneumothorax. No acute osseous abnormality. Bilateral shoulder degenerative changes. IMPRESSION: Question airspace opacity within the right mid lung zone. Finding could represent infection/inflammation. Followup PA and lateral chest X-ray is recommended in 3-4 weeks following therapy to ensure resolution and exclude underlying malignancy. Electronically Signed   By: Tish Frederickson M.D.   On: 05/06/2021 03:32   DG Foot 2 Views Right  Result Date: 05/07/2021 CLINICAL DATA:  Soft tissue swelling and infection, initial encounter EXAM: RIGHT FOOT - 2 VIEW COMPARISON:  03/20/2021 FINDINGS: There is been interval amputation of the first toe. Some soft tissue swelling is noted in the amputation site.  Calcaneal spurring is noted. Mild tarsal degenerative changes are seen. No discrete areas of erosive change to suggest osteomyelitis are IMPRESSION: Soft tissue swelling and interval first toe amputation. No findings to suggest osteomyelitis at this time. Electronically Signed  By: Inez Catalina M.D.   On: 05/07/2021 12:20    Blood pressure (!) 135/57, pulse 61, temperature 98.7 F (37.1 C), temperature source Oral, resp. rate 20, height 6' (1.829 m), weight 113.4 kg, SpO2 99 %.   Assessment 1. Dermal ulceration to the right first toe at the first toe amputation site 2. Subcutaneous ulceration to the distal tip of the right second toe 3. Diabetes type 2 polyneuropathy 4. PVD  Plan -Patient seen and examined. -X-ray imaging and CT imaging reviewed.  Does not reveal any obvious osteomyelitis present. -Clinically wounds appear to be stable at this time after debridement, no probing to bone, no erythema, no edema, no signs of infection present overall. -Wound debridements performed as described below without incident.  Patient tolerated well. -Applied Betadine wet-to-dry dressings today and ordered surgical shoe to be worn to the right foot when ambulating.  Patient should have dressing changes daily to every other day. -No further treatment indicated at this point and patient is to follow-up in outpatient clinic.  Podiatry team to sign off at this time.  Debridment of ulcer: Location: Right first metatarsal distal amputation site Pre-debridement measurement: 0 Post-debridement measurement: 0.4 x 0.3 cm by dermis Tissue removed:   Fibrin/hyperkeratotic tissue, biofilm, hemorrhagic tissue Ulcer was debrided sharply with combination of tissue nippers and scalpel blade into the dermis  Debridment of ulcer: Location: Right distal second toe Pre-debridement measurement: 0 Post-debridement measurement: 0.4 x 0.3 x 0.2 cm Tissue removed:   Fibrin/hyperkeratotic tissue, biofilm, hemorrhagic  tissue Ulcer was debrided sharply with combination of tissue nippers and scalpel blade into the subcutaneous tissue  Caroline More 05/07/2021, 5:11 PM

## 2021-05-07 NOTE — Clinical Social Work Note (Signed)
RE: Nathan Singleton Date of Birth: [birth year] Date: 05/07/2021   To Whom It May Concern:  Please be advised that the above-named patient will require a short-term nursing home stay - anticipated 30 days or less for rehabilitation and strengthening.  The plan is for return home.

## 2021-05-07 NOTE — Plan of Care (Signed)
  Problem: Education: Goal: Knowledge of General Education information will improve Description: Including pain rating scale, medication(s)/side effects and non-pharmacologic comfort measures Outcome: Progressing   Problem: Clinical Measurements: Goal: Ability to maintain clinical measurements within normal limits will improve Outcome: Progressing Goal: Will remain free from infection Outcome: Progressing Goal: Respiratory complications will improve Outcome: Progressing Goal: Cardiovascular complication will be avoided Outcome: Progressing   Problem: Activity: Goal: Risk for activity intolerance will decrease Outcome: Progressing   Problem: Pain Managment: Goal: General experience of comfort will improve Outcome: Progressing   Problem: Safety: Goal: Ability to remain free from injury will improve Outcome: Progressing   Problem: Skin Integrity: Goal: Risk for impaired skin integrity will decrease Outcome: Progressing

## 2021-05-07 NOTE — Progress Notes (Signed)
Pharmacy Antibiotic Note  Nathan Singleton is a 80 y.o. male admitted on 05/06/2021 with cellulitis. 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 Pharmacy has been consulted for vancomycin dosing. His renal function appears to be at or near his baseline level.  Plan: start vancomycin 2000 mg IV x 1, then 1250 mg IV Q 24 hrs  Goal AUC 400-550  Expected AUC: 502.6  SCr used: 1.40 mg/dL  Ke: 0.044 h-1, T1/2: 15.7 h  Css (calculated): 33.9 / 12.6 mcg/mL  Daily renal function assessment while on IV vancomycin   Height: 6' (182.9 cm) Weight: 113.4 kg (250 lb) IBW/kg (Calculated) : 77.6  Temp (24hrs), Avg:98.3 F (36.8 C), Min:97.4 F (36.3 C), Max:98.9 F (37.2 C)  Recent Labs  Lab 05/06/21 0150 05/07/21 0544  WBC 4.5 3.7*  CREATININE 1.40*  --     Estimated Creatinine Clearance: 56.5 mL/min (A) (by C-G formula based on SCr of 1.4 mg/dL (H)).    Allergies  Allergen Reactions  . Atorvastatin Other (See Comments)    "bad for kidneys" Cramping   . Celecoxib Other (See Comments)    Other reaction(s): Other (See Comments) Kidney Problem Kidney Problem   . Dicyclomine Hcl Other (See Comments)    Stomach cramps  . Doxycycline Other (See Comments)    Per wife (per RN)- pt gets severe stomach pains even when takes with food and PCP said to not take    . Metformin Other (See Comments)    Antimicrobials this admission: vancomycin 5/25 >>  ceftriaxone 5/25 >>   Microbiology results: 5/24 C diff: Ag, toxin negative 5/24 GI panel negative 5/24 SARS CoV-2: negative 5/24 influenza A/B: negative  Thank you for allowing pharmacy to be a part of this patient's care.  Dallie Piles 05/07/2021 10:28 AM

## 2021-05-07 NOTE — TOC Initial Note (Addendum)
Transition of Care [facility]) - Initial/Assessment Note    Patient Details  Name: Nathan Singleton MRN: [id number] Date of Birth: 02/10/1941  Transition of [facility]) CM/SW Contact:    Kerin Salen, RN Phone Number: 05/07/2021, 9:59 AM  Clinical Narrative:  Spoke with patient who is hard of hearing right ear, hearing aide battery is low. Patient says he lives with Fiance who assist with cooking and shopping. Patient says he still drives to medical visits. Use cane at home with ambulation and walker when shopping. Home Health services active, patient could not remember name of agency. Patient denies any needs at this time. Patient says his Fiance will transport home when discharged.  Reviewed chart, [facility] services arranged during last hospital stay. Text Rozelle Logan  Admission Nurse, [phone] who confirms that patient is active with [facility] services, however will call back to give more details of services provided.  TOC will continue to track for discharge needs.    Discussed the possibility of SNF for Rehabilitation patient consents if recommended.               Expected Discharge Plan: [place] Barriers to Discharge: Continued Medical Work up   Patient Goals and CMS Choice Patient states their goals for this hospitalization and ongoing recovery are:: To return home with Fiance.   Choice offered to / list presented to : NA  Expected Discharge Plan and Services Expected Discharge Plan: [place] arrangements for the past 2 months: Single Family Home                           HH Arranged: RN [place] Agency: [place] Date [facility] Agency Contacted: 05/07/21 Time HH Agency Contacted: 0900 Representative spoke with at [place]: [place] Arrangements/Services Living arrangements for the past 2 months: [place] Lives with:: Significant Other Patient language and need for interpreter reviewed::  Yes Do you feel safe going back to the place where you live?: Yes      Need for Family Participation in Patient Care: No (Comment) Care giver support system in place?: Yes (comment) Current home services: Home RN    Activities of Daily Living Home Assistive Devices/Equipment: Cane (specify quad or straight) ADL Screening (condition at time of admission) Patient's cognitive ability adequate to safely complete daily activities?: Yes Is the patient deaf or have difficulty hearing?: Yes Does the patient have difficulty seeing, even when wearing glasses/contacts?: Yes Does the patient have difficulty concentrating, remembering, or making decisions?: No Patient able to express need for assistance with ADLs?: Yes Does the patient have difficulty dressing or bathing?: Yes Independently performs ADLs?: Yes (appropriate for developmental age) Does the patient have difficulty walking or climbing stairs?: Yes Weakness of Legs: Both Weakness of Arms/Hands: Both  Permission Sought/Granted                  Emotional Assessment Appearance:: Appears stated age Attitude/Demeanor/Rapport: Engaged Affect (typically observed): Accepting Orientation: : Oriented to Place,Oriented to Self,Oriented to  Time Alcohol / Substance Use: Not Applicable Psych Involvement: No (comment)  Admission diagnosis:  Syncope [R55] CAP (community acquired pneumonia) [J18.9] Syncope, unspecified syncope type [R55] Community acquired pneumonia, unspecified laterality [J18.9] Patient Active Problem List   Diagnosis Date Noted  . CAP (community acquired pneumonia) 05/06/2021  . Syncope 05/06/2021  . CKD (chronic kidney disease), stage IIIa  05/06/2021  . Diarrhea 05/06/2021  . 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 Lumbar facet joint syndrome (Bilateral) (L>R) 04/22/2021  . 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 MSSA bacteremia    . Anemia of chronic disease   . Cellulitis of great toe, right 03/20/2021  . 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 CKD stage 3 due to type 2 diabetes mellitus ([place]) 03/20/2021  . Dizziness 03/20/2021  . Chronic pain syndrome 03/03/2021  . Pharmacologic therapy 03/03/2021  . Disorder of skeletal system 03/03/2021  . Problems influencing health status 03/03/2021  . 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[place]) 03/03/2021  . Chronic lower extremity pain (2ry area of Pain) (Bilateral) (R>L) 03/03/2021  . Chronic anticoagulation (Plavix) 03/03/2021  . Chronic shoulder pain (3ry area of Pain) (Left) 03/03/2021  . 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 Cervical facet hypertrophy (Multilevel) (Bilateral) 03/03/2021  . DDD (degenerative disc disease), cervical 03/03/2021  . BPH (benign prostatic hyperplasia) 02/14/2021  . Diabetic foot ulcer ([place]) 02/13/2021  . Normocytic anemia 11/26/2020  . Chronic kidney disease (CKD) 11/26/2020  . Claudication ([place]) 11/26/2020  . Contusion of right thigh 11/26/2020  . COPD (chronic obstructive pulmonary disease) ([place]) 11/26/2020  . Depression 11/26/2020  . Dyspnea 11/26/2020  . Erectile dysfunction 11/26/2020  . GERD without esophagitis 11/26/2020  . Gout 11/26/2020  . Hyperlipidemia, acquired 11/26/2020  . Hyperlipidemia 11/26/2020  . Myocardial infarction ([place]) 11/26/2020  . Personal history of other diseases of urinary system 11/26/2020  . Right leg swelling 11/26/2020  . Sleep apnea 11/26/2020  . Varicose veins 11/26/2020  . Venous insufficiency 11/26/2020  . Vitamin D deficiency 11/26/2020  . Chest pain 11/21/2020  . Thrombocytopenia ([place]) 11/21/2020  . S/P TAVR (transcatheter aortic valve replacement)   . PVD (peripheral vascular disease) ([place])   . Presence of permanent cardiac pacemaker   . Hypertension associated with diabetes ([place])    . Hyperlipidemia associated with type 2 diabetes mellitus ([place])   . History of GI bleed   . CAD (coronary artery disease)   . 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 Pacemaker St Jude device 11/19/2020  . Peripheral vascular disease, unspecified ([place]) stents to popliteal arteries many years ago 11/19/2020  . History of gingival bleeding 11/19/2020  . Coronary artery disease involving coronary bypass graft of native heart with angina pectoris ([place]) 11/19/2020  . 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 Hypertension   . DM2 (diabetes mellitus, type 2) ([place])   . Arthritis   . Closed nondisplaced fracture of greater trochanter of right femur ([place]) 01/25/2020  . DDD (degenerative disc disease), lumbar 12/10/2019  . Chronic low back pain (1ry area of Pain) (Bilateral) (L>R) w/o sciatica 12/09/2019  . 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 Urinary retention 12/09/2019  . Hearing loss 05/11/2019  . Obesity 04/07/2019  . 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 Bipolar disorder ([place]) 01/01/2019  . 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 Heart block 04/25/2018  . Calculus of gallbladder with acute cholecystitis without obstruction 09/21/2017  . Gastrojejunal ulcer 03/23/2014  . Insomnia 03/22/2014  . Blisters of multiple sites 01/18/2014  . Cellulitis 01/18/2014  . OA (osteoarthritis) of knee 06/09/2013  . Status post  percutaneous transluminal coronary angioplasty 01/14/2011   PCP:  Ranae Plumber, PA Pharmacy:   [id number] [place] [id number]: [phone]: [phone]    Social Determinants of Health (SDOH) Interventions    Readmission Risk Interventions Readmission Risk Prevention Plan 05/07/2021 03/23/2021 02/14/2021  Transportation Screening Complete Complete  Complete  Medication Review (RN Care Manager) Complete Complete Complete  PCP or Specialist appointment within 3-5 days of discharge Complete Complete Complete  HRI or Home Care Consult Complete Complete Complete  SW Recovery Care/Counseling Consult Complete Complete Complete  Palliative Care Screening Not Applicable Not Applicable Complete  [place] Applic[place] Not Applicable Complete

## 2021-05-07 NOTE — Progress Notes (Signed)
Patient ID: Nathan Singleton, male   DOB: [birth year], 80 y.o.   MRN: [id number] [facility] PROGRESS NOTE  Nathan Singleton [id number] DOB: 07-15-1941 DOA: 05/06/2021 PCP: Nathan Plumber, PA  HPI/Subjective: Patient brought in with syncope and found to be orthostatic.  Patient states his right second toe has been swollen and red.  Some pain.  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  Objective: Vitals:   05/07/21 0804 05/07/21 1113  BP: (!) 170/69 (!) 163/71  Pulse:  60  Resp:  20  Temp: 97.8 F (36.6 C) 98.6 F (37 C)  SpO2: 97% 99%    Intake/Output Summary (Last 24 hours) at 05/07/2021 1505 Last data filed at 05/07/2021 1411 Gross per 24 hour  Intake 780 ml  Output 1550 ml  Net -770 ml   Filed Weights   05/06/21 0120  Weight: 113.4 kg    ROS: Review of Systems  Respiratory: Negative for shortness of breath.   Cardiovascular: Negative for chest pain.  Gastrointestinal: Negative for abdominal pain, nausea and vomiting.  Musculoskeletal: Positive for joint pain.   Exam: Physical Exam HENT:     Head: Normocephalic.  Eyes:     General: Lids are normal.     Conjunctiva/sclera: Conjunctivae normal.     Pupils: Pupils are equal, round, and reactive to light.  Cardiovascular:     Rate and Rhythm: Normal rate and regular rhythm.     Heart sounds: Normal heart sounds, S1 normal and S2 normal.  Pulmonary:     Breath sounds: Normal breath sounds. No decreased breath sounds, wheezing, rhonchi or rales.  Abdominal:     Palpations: Abdomen is soft.     Tenderness: There is no abdominal tenderness.  Musculoskeletal:     Right knee: No swelling.     Left knee: No swelling.  Skin:    General: Skin is warm.     Comments: Right second toe swollen and red.  Neurological:     Mental Status: He is alert and oriented to person, place, and time.       Data Reviewed: Basic Metabolic Panel: Recent Labs  Lab 05/06/21 0150  NA 139  K 4.7  CL 110  CO2 22   GLUCOSE 160*  BUN 55*  CREATININE 1.40*  CALCIUM 8.6*   Liver Function Tests: Recent Labs  Lab 05/06/21 0150  AST 49*  ALT 30  ALKPHOS 106  BILITOT 0.5  PROT 7.5  ALBUMIN 3.8   CBC: Recent Labs  Lab 05/06/21 0150 05/07/21 0544  WBC 4.5 3.7*  NEUTROABS 3.1  --   HGB 9.9* 9.1*  HCT 30.3* 27.1*  MCV 92.1 89.4  PLT 126* 113*   BNP (last 3 results) Recent Labs    03/20/21 0346 05/06/21 0150  BNP 155.6* 118.7*    CBG: Recent Labs  Lab 05/06/21 1641 05/06/21 2150 05/07/21 0738 05/07/21 1133  GLUCAP 209* 277* 136* 257*    Recent Results (from the past 240 hour(s))  Resp Panel by RT-PCR (Flu A&B, Covid) Nasopharyngeal Swab     Status: None   Collection Time: 05/06/21  2:19 AM   Specimen: Nasopharyngeal Swab; Nasopharyngeal(NP) swabs in vial transport medium  Result Value Ref Range Status   SARS Coronavirus 2 by RT PCR NEGATIVE NEGATIVE Final    Comment: (NOTE) SARS-CoV-2 target nucleic acids are NOT DETECTED.  The SARS-CoV-2 RNA is generally detectable in upper respiratory specimens during the acute phase of infection. The lowest concentration of SARS-CoV-2 viral copies this assay can  detect is 138 copies/mL. A negative result does not preclude SARS-Cov-2 infection and should not be used as the sole basis for treatment or other patient management decisions. A negative result may occur with  improper specimen collection/handling, submission of specimen other than nasopharyngeal swab, presence of viral mutation(s) within the areas targeted by this assay, and inadequate number of viral copies(<138 copies/mL). A negative result must be combined with clinical observations, patient history, and epidemiological information. The expected result is Negative.  Fact Sheet for Patients:  EntrepreneurPulse.com.au  Fact Sheet for Healthcare Providers:  IncredibleEmployment.be  This test is no t yet approved or cleared by the  [place] FDA and  has been authorized for detection and/or diagnosis of SARS-CoV-2 by FDA under an Emergency Use Authorization (EUA). This EUA will remain  in effect (meaning this test can be used) for the duration of the COVID-19 declaration under Section 564(b)(1) of the Act, 21 U.S.C.section 360bbb-3(b)(1), unless the authorization is terminated  or revoked sooner.       Influenza A by PCR NEGATIVE NEGATIVE Final   Influenza B by PCR NEGATIVE NEGATIVE Final    Comment: (NOTE) The Xpert Xpress SARS-CoV-2/FLU/RSV plus assay is intended as an aid in the diagnosis of influenza from Nasopharyngeal swab specimens and should not be used as a sole basis for treatment. Nasal washings and aspirates are unacceptable for Xpert Xpress SARS-CoV-2/FLU/RSV testing.  Fact Sheet for Patients: EntrepreneurPulse.com.au  Fact Sheet for Healthcare Providers: IncredibleEmployment.be  This test is not yet approved or cleared by the [place] FDA and has been authorized for detection and/or diagnosis of SARS-CoV-2 by FDA under an Emergency Use Authorization (EUA). This EUA will remain in effect (meaning this test can be used) for the duration of the COVID-19 declaration under Section 564(b)(1) of the Act, 21 U.S.C. section 360bbb-3(b)(1), unless the authorization is terminated or revoked.  Performed at [facility], [place], [place] [address]   C Difficile Quick Screen w PCR reflex     Status: None   Collection Time: 05/06/21 11:29 PM   Specimen: STOOL  Result Value Ref Range Status   C Diff antigen NEGATIVE NEGATIVE Final   C Diff toxin NEGATIVE NEGATIVE Final   C Diff interpretation No C. difficile detected.  Final    Comment: Performed at [facility], [address] [place], [place] [address]  Gastrointestinal Panel by PCR , Stool     Status: None   Collection Time: 05/06/21 11:29 PM   Specimen: Stool   Result Value Ref Range Status   Campylobacter species NOT DETECTED NOT DETECTED Final   Plesimonas shigelloides NOT DETECTED NOT DETECTED Final   Salmonella species NOT DETECTED NOT DETECTED Final   Yersinia enterocolitica NOT DETECTED NOT DETECTED Final   Vibrio species NOT DETECTED NOT DETECTED Final   Vibrio cholerae NOT DETECTED NOT DETECTED Final   Enteroaggregative E coli (EAEC) NOT DETECTED NOT DETECTED Final   Enteropathogenic E coli (EPEC) NOT DETECTED NOT DETECTED Final   Enterotoxigenic E coli (ETEC) NOT DETECTED NOT DETECTED Final   Shiga like toxin producing E coli (STEC) NOT DETECTED NOT DETECTED Final   Shigella/Enteroinvasive E coli (EIEC) NOT DETECTED NOT DETECTED Final   Cryptosporidium NOT DETECTED NOT DETECTED Final   Cyclospora cayetanensis NOT DETECTED NOT DETECTED Final   Entamoeba histolytica NOT DETECTED NOT DETECTED Final   Giardia lamblia NOT DETECTED NOT DETECTED Final   Adenovirus F40/41 NOT DETECTED NOT DETECTED Final   Astrovirus NOT DETECTED  NOT DETECTED Final   Norovirus GI/GII NOT DETECTED NOT DETECTED Final   Rotavirus A NOT DETECTED NOT DETECTED Final   Sapovirus (I, II, IV, and V) NOT DETECTED NOT DETECTED Final    Comment: Performed at [facility], [address] [place], [place] [address]     Studies: CT HEAD WO CONTRAST  Result Date: 05/06/2021 CLINICAL DATA:  Syncopal episode EXAM: CT HEAD WITHOUT CONTRAST TECHNIQUE: Contiguous axial images were obtained from the base of the skull through the vertex without intravenous contrast. COMPARISON:  12/31/2020 FINDINGS: Brain: Stable mild atrophy pattern without acute intracranial hemorrhage, definite mass lesion, new infarction hemorrhage, midline shift, herniation, hydrocephalus, or extra-axial fluid collection. No focal mass effect or edema. 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 Cisterns are patent.  Cerebellar atrophy as well. Vascular: Intracranial atherosclerosis at the  skull base. No hyperdense vessel. Skull: Postop changes of the left mastoid. Mastoids are clear. No acute osseous finding. Sinuses/Orbits: No acute finding. Other: None. IMPRESSION: Stable atrophy pattern without acute intracranial abnormality by noncontrast CT. Limited by artifact from a left cochlear implant. Electronically Signed   By: Jerilynn Mages.  Shick M.D.   On: 05/06/2021 10:15   CT FOOT RIGHT WO CONTRAST  Result Date: 05/07/2021 CLINICAL DATA:  Foot swelling, diabetic, osteomyelitis suspected, xray done EXAM: CT OF THE RIGHT FOOT WITHOUT CONTRAST TECHNIQUE: Multidetector CT imaging of the right foot was performed according to the standard protocol. Multiplanar CT image reconstructions were also generated. COMPARISON:  Foot radiograph 05/07/2021 FINDINGS: Bones/Joint/Cartilage Prior great toe amputation. There is a cortical defect along the medial distal aspect of the residual first metatarsal (axial bone images 36 and 37 there are no other areas of cortical erosion/osseous destruction. There is no acute fracture identified. There is mild to moderate degenerative change involving the tibiotalar, middle subtalar, calcaneocuboid, talonavicular, and naviculocuneiform joints. Mild medial-sided tarsometatarsal joint and scattered interphalangeal joint degenerative change. Ligaments Suboptimally assessed by CT. Muscles and Tendons Mild muscle atrophy in the foot. Tendon other than the great toe flexor and extensors are unremarkable on noncontrast CT. Soft tissues Diffuse soft tissue swelling of the foot. IMPRESSION: Prior great toe amputation, with small cortical defect within the distal medial aspect of the residual first metatarsal which could represent small foci of osteomyelitis or postsurgical change. Additional small cortical defect at the distal tuft of the second toe which could be foci of osteomyelitis. MRI would be more sensitive. Diffuse foot soft tissue swelling. Electronically Signed   By: Maurine Simmering   On:  05/07/2021 14:06   DG Chest Portable 1 View  Result Date: 05/06/2021 CLINICAL DATA:  Syncope. EXAM: PORTABLE CHEST 1 VIEW COMPARISON:  Chest x-ray 01/13/2021, CT chest 11/15/2020 FINDINGS: Left chest wall 2 lead cardiac pacemaker in similar position. The heart size and mediastinal contours are within normal limits. Question airspace opacity within the right mid lung zone. No pulmonary edema. No pleural effusion. No pneumothorax. No acute osseous abnormality. Bilateral shoulder degenerative changes. IMPRESSION: Question airspace opacity within the right mid lung zone. Finding could represent infection/inflammation. Followup PA and lateral chest X-ray is recommended in 3-4 weeks following therapy to ensure resolution and exclude underlying malignancy. Electronically Signed   By: Iven Finn M.D.   On: 05/06/2021 03:32   DG Foot 2 Views Right  Result Date: 05/07/2021 CLINICAL DATA:  Soft tissue swelling and infection, initial encounter EXAM: RIGHT FOOT - 2 VIEW COMPARISON:  03/20/2021 FINDINGS: There is been interval amputation of the first toe. Some  soft tissue swelling is noted in the amputation site. Calcaneal spurring is noted. Mild tarsal degenerative changes are seen. No discrete areas of erosive change to suggest osteomyelitis are IMPRESSION: Soft tissue swelling and interval first toe amputation. No findings to suggest osteomyelitis at this time. Electronically Signed   By: Inez Catalina M.D.   On: 05/07/2021 12:20    Scheduled Meds: . allopurinol  100 mg Oral Daily  . amLODipine  5 mg Oral Daily  . aspirin EC  81 mg Oral Daily  . baclofen  10 mg Oral BID  . carvedilol  6.25 mg Oral QHS  . cholecalciferol  5,000 Units Oral Daily  . gabapentin  100 mg Oral Daily  . insulin aspart  0-5 Units Subcutaneous QHS  . insulin aspart  0-9 Units Subcutaneous TID WC  . insulin glargine  15 Units Subcutaneous QHS  . isosorbide mononitrate  30 mg Oral Daily  . loratadine  10 mg Oral Daily  .  multivitamin with minerals  1 tablet Oral Daily  . pantoprazole  40 mg Oral Daily  . rosuvastatin  40 mg Oral Daily  . sertraline  200 mg Oral Daily  . tamsulosin  0.4 mg Oral Daily   Continuous Infusions: . cefTRIAXone (ROCEPHIN)  IV 2 g (05/07/21 1155)  . lactated ringers 75 mL/hr at 05/06/21 2304  . [START ON 05/08/2021] vancomycin    . vancomycin 2,000 mg (05/07/21 1426)    Assessment/Plan:  1. Second toe cellulitis and possibility of osteomyelitis.  Sedimentation rate 68.  Podiatry consultation.  Start antibiotics vancomycin and Rocephin.  (Patient had a previous MSSA bacteremia with antibiotics through May 7). Obtain blood culture. 2. Syncope secondary to orthostatic hypotension.  Cut back on medications.  Continue to check orthostatics.  Continue IV fluids. 3. Weakness.  Physical therapy recommends rehab 4. 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  Platelet count 113 today.  Continue aspirin. 5. Bipolar disorder depression on Zoloft 6. History of CAD, peripheral vascular disease, history of TAVR.  Continue Coreg and Crestor 7. History of COPD 8. Hyperlipidemia unspecified on Crestor 9. Chronic kidney disease stage IIIa 10. Anemia of chronic disease.     Code Status:     Code Status Orders  (From admission, onward)         Start     Ordered   05/06/21 0822  Full code  Continuous        05/06/21 0821        Code Status History    Date Active Date Inactive Code Status Order ID Comments User Context   03/20/2021 0731 03/28/2021 2142 Full Code [id number]  Ivor Costa, MD ED   02/15/2021 1641 02/22/2021 2133 Full Code [id number]  Barb Merino, MD Inpatient   02/13/2021 1759 02/15/2021 1640 DNR [id number]  Rhetta Mura, DO Inpatient   11/21/2020 2137 11/22/2020 2249 DNR [id number]  Lenore Cordia, MD ED   Advance Care Planning Activity    Advance Directive Documentation   Flowsheet Row Most Recent Value  Type of Advance Directive Healthcare Power of Attorney  Pre-existing  out of facility DNR order (yellow form or pink MOST form) --  "MOST" Form in Place? --     Family Communication: Spoke with patient's wife on the phone Disposition Plan: Status is: Inpatient  Dispo: The patient is from: Home              Anticipated d/c is to: Home  Patient currently started on IV antibiotics for right second toe infection.  Also orthostatic.   Difficult to place patient.  Hopefully not.  Consultants:  Podiatry  Antibiotics:  Rocephin  Vancomycin  Time spent: 28 minutes  [place]

## 2021-05-07 NOTE — Progress Notes (Signed)
PT Cancellation Note  Patient Details Name: Matrix Schwind MRN: [id number] DOB: January 12, 1941   Cancelled Treatment:     Pt refused after 2 attempts.   Josie Dixon 05/07/2021, 4:25 PM

## 2021-05-07 NOTE — Progress Notes (Signed)
Occupational Therapy Treatment Patient Details Name: Oshane Borunda MRN: [id number] DOB: [birth year] Today's Date: 05/07/2021    History of present illness Patient is a 80 y.o. male with a history of CAD on Plavix and aspirin, sick sinus syndrome status post [employer] pacemaker, ascending aortic aneurysm, diabetes, peptic ulcer disease, GI bleeding, aortic valve replacement, hypertension, hyperlipidemia, chronic kidney disease who presents for evaluation of syncope.   OT comments  Pt seen for OT treatment on this date. Upon arrival to room pt awake and seated upright in bed with fiance present. Pt agreeable to OT tx. This date, pt required MIN A for bed mobility, MIN GUARD for sit<>stand transfers, and MIN GUARD for walking x10 forward and backwards steps with RW. Pt deferred ADLs this date, stating that he "already did them this morning". While sitting in recliner, pt participated in seated UE/LE therapy exercises. Of note, pt negative for orthostatic hypotension and denying dizziness throughout session. Pt is making good progress toward goals and continues to benefit from skilled OT services to maximize return to PLOF and minimize risk of future falls, injury, caregiver burden, and readmission. Will continue to follow POC. Discharge recommendation remains appropriate.    Follow Up Recommendations  SNF    Equipment Recommendations  3 in 1 bedside commode (bariatric BSC)       Precautions / Restrictions Precautions Precautions: Fall Restrictions Other Position/Activity Restrictions: patient recently had right toe amputation where he was heel weight bearing only with off loading shoe in place. per the patient report, he is no longer required to wear the off loading shoe       Mobility Bed Mobility Overal bed mobility: Needs Assistance Bed Mobility: Supine to Sit     Supine to sit: Min assist     General bed mobility comments: MIN A for scooting hips towards EOB     Transfers Overall transfer level: Needs assistance Equipment used: Rolling walker (2 wheeled) Transfers: Sit to/from Stand Sit to Stand: Min guard              Balance Overall balance assessment: Needs assistance Sitting-balance support: No upper extremity supported;Feet supported Sitting balance-Leahy Scale: Good Sitting balance - Comments: Good sitting balance at EOB while reaching within BOS   Standing balance support: Bilateral upper extremity supported;During functional activity Standing balance-Leahy Scale: Fair Standing balance comment: MIN GUARD for walking forward/backwards x10 steps with RW                           ADL either performed or assessed with clinical judgement   ADL                                         General ADL Comments: Pt deferred participating in ADLs this date, stating he "already did everything this morning"               Cognition Arousal/Alertness: Awake/alert Behavior During Therapy: WFL for tasks assessed/performed Overall Cognitive Status: Within Functional Limits for tasks assessed                                 General Comments: very HOH. Pleasant and agreeable throughout        Exercises General Exercises - Upper Extremity Shoulder Flexion: AROM;Both;10 reps;Seated General Exercises - Lower Extremity  Long Arc Quad: AROM;Both;10 reps;Seated Hip Flexion/Marching: AROM;Both;10 reps;Seated Other Exercises Other Exercises: x10 reps of scapula elevation           Pertinent Vitals/ Pain       Pain Assessment: 0-10 Pain Score: 5  Pain Location: R foot Pain Descriptors / Indicators: Aching Pain Intervention(s): Limited activity within patient's tolerance;Monitored during session;Repositioned;RN gave pain meds during session         Frequency  Min 1X/week        Progress Toward Goals  OT Goals(current goals can now be found in the care plan section)  Progress  towards OT goals: Progressing toward goals  Acute Rehab OT Goals Patient Stated Goal: to get stronger OT Goal Formulation: With patient Time For Goal Achievement: 05/20/21 Potential to Achieve Goals: Good  Plan Discharge plan remains appropriate;Frequency remains appropriate       AM-PAC OT "6 Clicks" Daily Activity     Outcome Measure   Help from another person eating meals?: None   Help from another person toileting, which includes using toliet, bedpan, or urinal?: A Little Help from another person bathing (including washing, rinsing, drying)?: A Lot Help from another person to put on and taking off regular upper body clothing?: A Little Help from another person to put on and taking off regular lower body clothing?: A Lot 6 Click Score: 14    End of Session Equipment Utilized During Treatment: Rolling walker  OT Visit Diagnosis: Unsteadiness on feet (R26.81);Muscle weakness (generalized) (M62.81)   Activity Tolerance Patient tolerated treatment well   Patient Left in chair;with call bell/phone within reach;with chair alarm set   Nurse Communication Mobility status        Time: [id number] OT Time Calculation (min): 32 min  Charges: OT General Charges $OT Visit: 1 Visit OT Treatments $Therapeutic Activity: 23-37 mins  Fredirick Maudlin, OTR/L [place]

## 2021-05-07 NOTE — NC FL2 (Signed)
[place] LEVEL OF CARE SCREENING TOOL     IDENTIFICATION  Patient Name: Nathan Singleton Birthdate: 1941/09/09 Sex: male Admission Date (Current Location): 05/06/2021  [place] and [place] Number:  Engineering geologist and Address:  [facility], [address], [place], [place] [address]      Provider Number: [id number]  Attending Physician Name and Address:  Loletha Grayer, MD  Relative Name and Phone Number:       Current Level of Care: Hospital Recommended Level of Care: [place] Prior Approval Number:    Date Approved/Denied:   PASRR Number: Manual review  Discharge Plan: SNF    Current Diagnoses: Patient Active Problem List   Diagnosis Date Noted  . CAP (community acquired pneumonia) 05/06/2021  . Syncope 05/06/2021  . CKD (chronic kidney disease), stage IIIa 05/06/2021  . Diarrhea 05/06/2021  . 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 Lumbar facet joint syndrome (Bilateral) (L>R) 04/22/2021  . 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 MSSA bacteremia   . Anemia of chronic disease   . Cellulitis of great toe, right 03/20/2021  . Type II diabetes mellitus with renal manifestations ([place]) 03/20/2021  . CKD stage 3 due to type 2 diabetes mellitus ([place]) 03/20/2021  . Dizziness 03/20/2021  . Chronic pain syndrome 03/03/2021  . Pharmacologic therapy 03/03/2021  . Disorder of skeletal system 03/03/2021  . Problems influencing health status 03/03/2021  . 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[place]) 03/03/2021  . Chronic lower extremity pain (2ry area of Pain) (Bilateral) (R>L) 03/03/2021  . Chronic anticoagulation (Plavix) 03/03/2021  . Chronic shoulder pain (3ry area of Pain) (Left) 03/03/2021  . 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 Cervical facet hypertrophy  (Multilevel) (Bilateral) 03/03/2021  . DDD (degenerative disc disease), cervical 03/03/2021  . BPH (benign prostatic hyperplasia) 02/14/2021  . Diabetic foot ulcer ([place]) 02/13/2021  . Normocytic anemia 11/26/2020  . Chronic kidney disease (CKD) 11/26/2020  . Claudication ([place]) 11/26/2020  . Contusion of right thigh 11/26/2020  . COPD (chronic obstructive pulmonary disease) ([place]) 11/26/2020  . Depression 11/26/2020  . Dyspnea 11/26/2020  . Erectile dysfunction 11/26/2020  . GERD without esophagitis 11/26/2020  . Gout 11/26/2020  . Hyperlipidemia, acquired 11/26/2020  . Hyperlipidemia 11/26/2020  . Myocardial infarction ([place]) 11/26/2020  . Personal history of other diseases of urinary system 11/26/2020  . Right leg swelling 11/26/2020  . Sleep apnea 11/26/2020  . Varicose veins 11/26/2020  . Venous insufficiency 11/26/2020  . Vitamin D deficiency 11/26/2020  . Chest pain 11/21/2020  . Thrombocytopenia ([place]) 11/21/2020  . S/P TAVR (transcatheter aortic valve replacement)   . PVD (peripheral vascular disease) ([place])   . Presence of permanent cardiac pacemaker   . Hypertension associated with diabetes ([place])   . Hyperlipidemia associated with type 2 diabetes mellitus ([place])   . History of GI bleed   . CAD (coronary artery disease)   . 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 Pacemaker St Jude device 11/19/2020  . Peripheral vascular disease, unspecified ([place]) stents to popliteal arteries many years ago 11/19/2020  . History of gingival bleeding 11/19/2020  . Coronary artery disease involving coronary bypass graft of native heart with angina pectoris ([place]) 11/19/2020  . 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 Hypertension   .  DM2 (diabetes mellitus, type 2) ([place])   . Arthritis   . Closed nondisplaced fracture of greater trochanter of right femur ([place]) 01/25/2020  . DDD (degenerative disc  disease), lumbar 12/10/2019  . Chronic low back pain (1ry area of Pain) (Bilateral) (L>R) w/o sciatica 12/09/2019  . 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 Urinary retention 12/09/2019  . Hearing loss 05/11/2019  . Obesity 04/07/2019  . 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 Bipolar disorder ([place]) 01/01/2019  . 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 Heart block 04/25/2018  . Calculus of gallbladder with acute cholecystitis without obstruction 09/21/2017  . Gastrojejunal ulcer 03/23/2014  . Insomnia 03/22/2014  . Blisters of multiple sites 01/18/2014  . Cellulitis 01/18/2014  . OA (osteoarthritis) of knee 06/09/2013  . Status post percutaneous transluminal coronary angioplasty 01/14/2011    Orientation RESPIRATION BLADDER Height & Weight     Self,Time,Situation,Place  Normal Continent,External catheter Weight: 250 lb (113.4 kg) Height:  6' (182.9 cm)  BEHAVIORAL SYMPTOMS/MOOD NEUROLOGICAL BOWEL NUTRITION STATUS   (None)  (None) Incontinent Diet (Heart healthy/carb modified)  AMBULATORY STATUS COMMUNICATION OF NEEDS Skin   Extensive Assist Verbally Skin abrasions,Other (Comment) (Right great toe amputated: Gauze, impregnated gauze (bismuth) every other day. Non-pressure wound on right anterior distal toe (gauze every other day). Blister.)                       Personal Care Assistance Level of Assistance  Bathing,Feeding,Dressing Bathing Assistance: Maximum assistance Feeding assistance: Limited assistance Dressing Assistance: Maximum assistance     Functional Limitations Info  Sight,Hearing,Speech Sight Info: Adequate Hearing Info: Impaired Speech Info: Adequate    SPECIAL CARE FACTORS FREQUENCY  PT (By licensed PT),OT (By licensed OT)     PT Frequency: 5 x week OT Frequency: 5 x week            Contractures Contractures Info: Not present    Additional Factors Info  Code  Status,Allergies,Psychotropic Code Status Info: Full code Allergies Info: Atorvastatin, Celecoxib, Dicyclomine Hcl, Doxycycline, Metformin Psychotropic Info: Bipolar         Current Medications (05/07/2021):  This is the current hospital active medication list Current Facility-Administered Medications  Medication Dose Route Frequency Provider Last Rate Last Admin  . acetaminophen (TYLENOL) tablet 650 mg  650 mg Oral Q6H PRN Ivor Costa, MD   650 mg at 05/06/21 2329  . albuterol (VENTOLIN HFA) 108 (90 Base) MCG/ACT inhaler 2 puff  2 puff Inhalation Q4H PRN Ivor Costa, MD      . allopurinol (ZYLOPRIM) tablet 100 mg  100 mg Oral Daily Ivor Costa, MD   100 mg at 05/07/21 1151  . amLODipine (NORVASC) tablet 5 mg  5 mg Oral Daily Ivor Costa, MD   5 mg at 05/07/21 0930  . aspirin EC tablet 81 mg  81 mg Oral Daily Ivor Costa, MD   81 mg at 05/07/21 [id number]  . baclofen (LIORESAL) tablet 10 mg  10 mg Oral BID Ivor Costa, MD   10 mg at 05/07/21 [id number]  . carvedilol (COREG) tablet 6.25 mg  6.25 mg Oral QHS Ivor Costa, MD   6.25 mg at 05/06/21 2313  . cefTRIAXone (ROCEPHIN) 2 g in sodium chloride 0.9 % 100 mL IVPB  2 g Intravenous Daily Loletha Grayer, MD 200 mL/hr at 05/07/21 1155 2 g at 05/07/21 1155  . cholecalciferol (VITAMIN D3) tablet 5,000 Units  5,000 Units Oral Daily Ivor Costa, MD  5,000 Units at 05/07/21 0930  . dextromethorphan-guaiFENesin (MUCINEX DM) 30-600 MG per 12 hr tablet 1 tablet  1 tablet Oral BID PRN Ivor Costa, MD      . gabapentin (NEURONTIN) capsule 100 mg  100 mg Oral Daily Ivor Costa, MD   100 mg at 05/07/21 [id number]  . insulin aspart (novoLOG) injection 0-5 Units  0-5 Units Subcutaneous QHS Ivor Costa, MD   3 Units at 05/06/21 2305  . insulin aspart (novoLOG) injection 0-9 Units  0-9 Units Subcutaneous TID WC Ivor Costa, MD   5 Units at 05/07/21 1150  . insulin glargine (LANTUS) injection 15 Units  15 Units Subcutaneous QHS Ivor Costa, MD   15 Units at 05/06/21 2307  . isosorbide  mononitrate (IMDUR) 24 hr tablet 30 mg  30 mg Oral Daily Ivor Costa, MD   30 mg at 05/07/21 0930  . lactated ringers infusion   Intravenous Continuous Ivor Costa, MD 75 mL/hr at 05/06/21 2304 New Bag at 05/06/21 2304  . loperamide (IMODIUM) capsule 2 mg  2 mg Oral PRN Ivor Costa, MD      . loratadine (CLARITIN) tablet 10 mg  10 mg Oral Daily Ivor Costa, MD   10 mg at 05/07/21 0930  . multivitamin with minerals tablet 1 tablet  1 tablet Oral Daily Ivor Costa, MD   1 tablet at 05/07/21 [phone]  . ondansetron (ZOFRAN) injection 4 mg  4 mg Intravenous Q8H PRN Ivor Costa, MD      . pantoprazole (PROTONIX) EC tablet 40 mg  40 mg Oral Daily Ivor Costa, MD   40 mg at 05/07/21 1151  . rosuvastatin (CRESTOR) tablet 40 mg  40 mg Oral Daily Ivor Costa, MD   40 mg at 05/07/21 [id number]  . sertraline (ZOLOFT) tablet 200 mg  200 mg Oral Daily Ivor Costa, MD   200 mg at 05/07/21 [id number]  . tamsulosin (FLOMAX) capsule 0.4 mg  0.4 mg Oral Daily Ivor Costa, MD   0.4 mg at 05/07/21 0932  . [START ON 05/08/2021] vancomycin (VANCOREADY) IVPB 1250 mg/250 mL  1,250 mg Intravenous Q24H Dallie Piles, RPH      . vancomycin (VANCOREADY) IVPB 2000 mg/400 mL  2,000 mg Intravenous Once Dallie Piles, [facility]         Discharge Medications: Please see discharge summary for a list of discharge medications.  Relevant Imaging Results:  Relevant Lab Results:   Additional Information SS#: [id number]  Candie Chroman, LCSW

## 2021-05-08 DIAGNOSIS — E785 Hyperlipidemia, unspecified: Secondary | ICD-10-CM

## 2021-05-08 DIAGNOSIS — D638 Anemia in other chronic diseases classified elsewhere: Secondary | ICD-10-CM

## 2021-05-08 DIAGNOSIS — L03031 Cellulitis of right toe: Secondary | ICD-10-CM | POA: Diagnosis not present

## 2021-05-08 DIAGNOSIS — I951 Orthostatic hypotension: Secondary | ICD-10-CM | POA: Diagnosis not present

## 2021-05-08 DIAGNOSIS — R55 Syncope and collapse: Secondary | ICD-10-CM | POA: Diagnosis not present

## 2021-05-08 DIAGNOSIS — R531 Weakness: Secondary | ICD-10-CM | POA: Diagnosis not present

## 2021-05-08 LAB — BASIC METABOLIC PANEL
Anion gap: 9 (ref 5–15)
BUN: 39 mg/dL — ABNORMAL HIGH (ref 8–23)
CO2: 22 mmol/L (ref 22–32)
Calcium: 8.7 mg/dL — ABNORMAL LOW (ref 8.9–10.3)
Chloride: 106 mmol/L (ref 98–111)
Creatinine, Ser: 1.24 mg/dL (ref 0.61–1.24)
GFR, Estimated: 60 mL/min — ABNORMAL LOW (ref >60)
Glucose, Bld: 120 mg/dL — ABNORMAL HIGH (ref 70–99)
Potassium: 4.2 mmol/L (ref 3.5–5.1)
Sodium: 137 mmol/L (ref 135–145)

## 2021-05-08 LAB — CBC
HCT: 28 % — ABNORMAL LOW (ref 39.0–52.0)
Hemoglobin: 9.5 g/dL — ABNORMAL LOW (ref 13.0–17.0)
MCH: 30.1 pg (ref 26.0–34.0)
MCHC: 33.9 g/dL (ref 30.0–36.0)
MCV: 88.6 fL (ref 80.0–100.0)
Platelets: 121 10*3/uL — ABNORMAL LOW (ref 150–400)
RBC: 3.16 MIL/uL — ABNORMAL LOW (ref 4.22–5.81)
RDW: 14.6 % (ref 11.5–15.5)
WBC: 4 10*3/uL (ref 4.0–10.5)
nRBC: 0 % (ref 0.0–0.2)

## 2021-05-08 LAB — GLUCOSE, CAPILLARY
Glucose-Capillary: 125 mg/dL — ABNORMAL HIGH (ref 70–99)
Glucose-Capillary: 224 mg/dL — ABNORMAL HIGH (ref 70–99)
Glucose-Capillary: 295 mg/dL — ABNORMAL HIGH (ref 70–99)
Glucose-Capillary: 158 mg/dL — ABNORMAL HIGH (ref 70–99)

## 2021-05-08 NOTE — TOC Progression Note (Addendum)
Transition of Care [facility]) - Progression Note    Patient Details  Name: Nathan Singleton MRN: [id number] Date of Birth: 09/29/1941  Transition of [facility]) CM/SW [place], LCSW Phone Number: 05/08/2021, 9:45 AM  Clinical Narrative:  Patient has one bed offer [facility]) and 5 still pending. Sent updated therapy notes this morning to try and trigger more responses. Will provided bed offers around noon.  11:25 pm: This CSW working remote today. Tried calling in room to give bed offer but no answer. Will try again later.   12:17 pm: Tried calling in room again but no answer. Patient has another bed offer from [facility]  4:06 pm: Tried calling in room again. No answer. Will try tomorrow.  Expected Discharge Plan: [place] Barriers to Discharge: Continued Medical Work up  Expected Discharge Plan and Services Expected Discharge Plan: [place] arrangements for the past 2 months: Single Family Home                           HH Arranged: RN [facility] Agency: [place] Date [facility] Agency Contacted: 05/07/21 Time HH Agency Contacted: 0900 Representative spoke with at [place]: [place] Determinants of Health ([place]) Interventions    Readmission Risk Interventions Readmission Risk Prevention Plan 05/07/2021 03/23/2021 02/14/2021  Transportation Screening Complete Complete Complete  Medication Review Press photographer) Complete Complete Complete  PCP or Specialist appointment within 3-5 days of discharge Complete Complete Complete  HRI or Home Care Consult Complete Complete Complete  SW Recovery Care/Counseling Consult Complete Complete Complete  Palliative Care Screening Not Applicable Not Applicable Complete  [place] Not Applicable Not Applicable Complete

## 2021-05-08 NOTE — Progress Notes (Addendum)
Patient ID: Nathan Singleton, male   DOB: [birth year], 80 y.o.   MRN: [id number] [facility] PROGRESS NOTE  Earnestine Zuker [id number] DOB: Jun 18, 1941 DOA: 05/06/2021 PCP: Ranae Plumber, PA  HPI/Subjective: Patient has old discomfort in his toes but otherwise feeling okay.  No chest pain or shortness of breath.  Admitted with orthostatic hypotension and syncope.   Objective: Vitals:   05/08/21 1312 05/08/21 1535  BP: (!) 146/62 (!) 144/63  Pulse: 70 60  Resp: 20 20  Temp: 99.4 F (37.4 C) 99.2 F (37.3 C)  SpO2: 99% 97%    Intake/Output Summary (Last 24 hours) at 05/08/2021 1624 Last data filed at 05/08/2021 1500 Gross per 24 hour  Intake 2378.27 ml  Output 3000 ml  Net -621.73 ml   Filed Weights   05/06/21 0120  Weight: 113.4 kg    ROS: Review of Systems  Respiratory: Negative for shortness of breath.   Cardiovascular: Negative for chest pain.  Gastrointestinal: Negative for abdominal pain, nausea and vomiting.  Musculoskeletal: Positive for joint pain.   Exam: Physical Exam HENT:     Head: Normocephalic.     Mouth/Throat:     Pharynx: No oropharyngeal exudate.  Eyes:     General: Lids are normal.     Conjunctiva/sclera: Conjunctivae normal.     Pupils: Pupils are equal, round, and reactive to light.  Cardiovascular:     Rate and Rhythm: Normal rate and regular rhythm.     Heart sounds: Normal heart sounds, S1 normal and S2 normal.  Pulmonary:     Breath sounds: No decreased breath sounds, wheezing, rhonchi or rales.  Abdominal:     Palpations: Abdomen is soft.     Tenderness: There is no abdominal tenderness.  Musculoskeletal:     Right lower leg: No swelling.     Left lower leg: No swelling.  Skin:    General: Skin is warm.     Comments: Right foot covered in pressure dressing.  Neurological:     Mental Status: He is alert.     Comments: Patient answers all questions appropriately.       Data Reviewed: Basic Metabolic Panel: Recent Labs   Lab 05/06/21 0150 05/08/21 0456  NA 139 137  K 4.7 4.2  CL 110 106  CO2 22 22  GLUCOSE 160* 120*  BUN 55* 39*  CREATININE 1.40* 1.24  CALCIUM 8.6* 8.7*   Liver Function Tests: Recent Labs  Lab 05/06/21 0150  AST 49*  ALT 30  ALKPHOS 106  BILITOT 0.5  PROT 7.5  ALBUMIN 3.8   CBC: Recent Labs  Lab 05/06/21 0150 05/07/21 0544 05/08/21 0456  WBC 4.5 3.7* 4.0  NEUTROABS 3.1  --   --   HGB 9.9* 9.1* 9.5*  HCT 30.3* 27.1* 28.0*  MCV 92.1 89.4 88.6  PLT 126* 113* 121*   BNP (last 3 results) Recent Labs    03/20/21 0346 05/06/21 0150  BNP 155.6* 118.7*    CBG: Recent Labs  Lab 05/07/21 1133 05/07/21 1630 05/07/21 2102 05/08/21 0735 05/08/21 1138  GLUCAP 257* 231* 213* 125* 224*    Recent Results (from the past 240 hour(s))  Resp Panel by RT-PCR (Flu A&B, Covid) Nasopharyngeal Swab     Status: None   Collection Time: 05/06/21  2:19 AM   Specimen: Nasopharyngeal Swab; Nasopharyngeal(NP) swabs in vial transport medium  Result Value Ref Range Status   SARS Coronavirus 2 by RT PCR NEGATIVE NEGATIVE Final    Comment: (NOTE) SARS-CoV-2 target nucleic  acids are NOT DETECTED.  The SARS-CoV-2 RNA is generally detectable in upper respiratory specimens during the acute phase of infection. The lowest concentration of SARS-CoV-2 viral copies this assay can detect is 138 copies/mL. A negative result does not preclude SARS-Cov-2 infection and should not be used as the sole basis for treatment or other patient management decisions. A negative result may occur with  improper specimen collection/handling, submission of specimen other than nasopharyngeal swab, presence of viral mutation(s) within the areas targeted by this assay, and inadequate number of viral copies(<138 copies/mL). A negative result must be combined with clinical observations, patient history, and epidemiological information. The expected result is Negative.  Fact Sheet for Patients:   EntrepreneurPulse.com.au  Fact Sheet for Healthcare Providers:  IncredibleEmployment.be  This test is no t yet approved or cleared by the [place] FDA and  has been authorized for detection and/or diagnosis of SARS-CoV-2 by FDA under an Emergency Use Authorization (EUA). This EUA will remain  in effect (meaning this test can be used) for the duration of the COVID-19 declaration under Section 564(b)(1) of the Act, 21 U.S.C.section 360bbb-3(b)(1), unless the authorization is terminated  or revoked sooner.       Influenza A by PCR NEGATIVE NEGATIVE Final   Influenza B by PCR NEGATIVE NEGATIVE Final    Comment: (NOTE) The Xpert Xpress SARS-CoV-2/FLU/RSV plus assay is intended as an aid in the diagnosis of influenza from Nasopharyngeal swab specimens and should not be used as a sole basis for treatment. Nasal washings and aspirates are unacceptable for Xpert Xpress SARS-CoV-2/FLU/RSV testing.  Fact Sheet for Patients: EntrepreneurPulse.com.au  Fact Sheet for Healthcare Providers: IncredibleEmployment.be  This test is not yet approved or cleared by the [place] FDA and has been authorized for detection and/or diagnosis of SARS-CoV-2 by FDA under an Emergency Use Authorization (EUA). This EUA will remain in effect (meaning this test can be used) for the duration of the COVID-19 declaration under Section 564(b)(1) of the Act, 21 U.S.C. section 360bbb-3(b)(1), unless the authorization is terminated or revoked.  Performed at [facility], [place], [place] [address]   C Difficile Quick Screen w PCR reflex     Status: None   Collection Time: 05/06/21 11:29 PM   Specimen: STOOL  Result Value Ref Range Status   C Diff antigen NEGATIVE NEGATIVE Final   C Diff toxin NEGATIVE NEGATIVE Final   C Diff interpretation No C. difficile detected.  Final    Comment: Performed at [facility], [address] [place], [place] [address]  Gastrointestinal Panel by PCR , Stool     Status: None   Collection Time: 05/06/21 11:29 PM   Specimen: Stool  Result Value Ref Range Status   Campylobacter species NOT DETECTED NOT DETECTED Final   Plesimonas shigelloides NOT DETECTED NOT DETECTED Final   Salmonella species NOT DETECTED NOT DETECTED Final   Yersinia enterocolitica NOT DETECTED NOT DETECTED Final   Vibrio species NOT DETECTED NOT DETECTED Final   Vibrio cholerae NOT DETECTED NOT DETECTED Final   Enteroaggregative E coli (EAEC) NOT DETECTED NOT DETECTED Final   Enteropathogenic E coli (EPEC) NOT DETECTED NOT DETECTED Final   Enterotoxigenic E coli (ETEC) NOT DETECTED NOT DETECTED Final   Shiga like toxin producing E coli (STEC) NOT DETECTED NOT DETECTED Final   Shigella/Enteroinvasive E coli (EIEC) NOT DETECTED NOT DETECTED Final   Cryptosporidium NOT DETECTED NOT DETECTED Final   Cyclospora cayetanensis NOT DETECTED NOT DETECTED Final  Entamoeba histolytica NOT DETECTED NOT DETECTED Final   Giardia lamblia NOT DETECTED NOT DETECTED Final   Adenovirus F40/41 NOT DETECTED NOT DETECTED Final   Astrovirus NOT DETECTED NOT DETECTED Final   Norovirus GI/GII NOT DETECTED NOT DETECTED Final   Rotavirus A NOT DETECTED NOT DETECTED Final   Sapovirus (I, II, IV, and V) NOT DETECTED NOT DETECTED Final    Comment: Performed at [facility], [address] [place], [place] [address]  CULTURE, BLOOD (ROUTINE X 2) w Reflex to ID Panel     Status: None (Preliminary result)   Collection Time: 05/07/21  3:38 PM   Specimen: BLOOD  Result Value Ref Range Status   Specimen Description BLOOD BLOOD LEFT FOREARM  Final   Special Requests   Final    BOTTLES DRAWN AEROBIC AND ANAEROBIC Blood Culture results may not be optimal due to an inadequate volume of blood received in culture bottles   Culture   Final    NO GROWTH < 24 HOURS Performed at [facility], [address] [place], [place] [address]    Report Status PENDING  Incomplete  CULTURE, BLOOD (ROUTINE X 2) w Reflex to ID Panel     Status: None (Preliminary result)   Collection Time: 05/07/21  3:38 PM   Specimen: BLOOD  Result Value Ref Range Status   Specimen Description BLOOD BLOOD RIGHT HAND  Final   Special Requests   Final    BOTTLES DRAWN AEROBIC AND ANAEROBIC Blood Culture results may not be optimal due to an inadequate volume of blood received in culture bottles   Culture   Final    NO GROWTH < 24 HOURS Performed at [facility], [address] [place], [place] [address]    Report Status PENDING  Incomplete     Studies: CT FOOT RIGHT WO CONTRAST  Result Date: 05/07/2021 CLINICAL DATA:  Foot swelling, diabetic, osteomyelitis suspected, xray done EXAM: CT OF THE RIGHT FOOT WITHOUT CONTRAST TECHNIQUE: Multidetector CT imaging of the right foot was performed according to the standard protocol. Multiplanar CT image reconstructions were also generated. COMPARISON:  Foot radiograph 05/07/2021 FINDINGS: Bones/Joint/Cartilage Prior great toe amputation. There is a cortical defect along the medial distal aspect of the residual first metatarsal (axial bone images 36 and 37 there are no other areas of cortical erosion/osseous destruction. There is no acute fracture identified. There is mild to moderate degenerative change involving the tibiotalar, middle subtalar, calcaneocuboid, talonavicular, and naviculocuneiform joints. Mild medial-sided tarsometatarsal joint and scattered interphalangeal joint degenerative change. Ligaments Suboptimally assessed by CT. Muscles and Tendons Mild muscle atrophy in the foot. Tendon other than the great toe flexor and extensors are unremarkable on noncontrast CT. Soft tissues Diffuse soft tissue swelling of the foot. IMPRESSION: Prior great toe amputation, with small cortical defect within the distal medial aspect of the residual first metatarsal which  could represent small foci of osteomyelitis or postsurgical change. Additional small cortical defect at the distal tuft of the second toe which could be foci of osteomyelitis. MRI would be more sensitive. Diffuse foot soft tissue swelling. Electronically Signed   By: Maurine Simmering   On: 05/07/2021 14:06   DG Foot 2 Views Right  Result Date: 05/07/2021 CLINICAL DATA:  Soft tissue swelling and infection, initial encounter EXAM: RIGHT FOOT - 2 VIEW COMPARISON:  03/20/2021 FINDINGS: There is been interval amputation of the first toe. Some soft tissue swelling is noted in the amputation site. Calcaneal spurring is noted. Mild  tarsal degenerative changes are seen. No discrete areas of erosive change to suggest osteomyelitis are IMPRESSION: Soft tissue swelling and interval first toe amputation. No findings to suggest osteomyelitis at this time. Electronically Signed   By: Inez Catalina M.D.   On: 05/07/2021 12:20    Scheduled Meds: . allopurinol  100 mg Oral Daily  . amLODipine  5 mg Oral Daily  . aspirin EC  81 mg Oral Daily  . baclofen  10 mg Oral BID  . carvedilol  6.25 mg Oral QHS  . cholecalciferol  5,000 Units Oral Daily  . gabapentin  100 mg Oral Daily  . insulin aspart  0-5 Units Subcutaneous QHS  . insulin aspart  0-9 Units Subcutaneous TID WC  . insulin glargine  15 Units Subcutaneous QHS  . isosorbide mononitrate  30 mg Oral Daily  . loratadine  10 mg Oral Daily  . multivitamin with minerals  1 tablet Oral Daily  . pantoprazole  40 mg Oral Daily  . rosuvastatin  40 mg Oral Daily  . sertraline  200 mg Oral Daily  . tamsulosin  0.4 mg Oral Daily   Continuous Infusions: . cefTRIAXone (ROCEPHIN)  IV 2 g (05/08/21 1230)  . vancomycin 1,250 mg (05/08/21 1036)    Assessment/Plan:  1. Second toe cellulitis.  Podiatry did bedside debridement.  Blood cultures so far negative.  Continue vancomycin and Rocephin.  Sedimentation elevated at 68.  Will recheck tomorrow.  Case discussed with  podiatry and no further procedures needed at this point. 2. Syncope secondary to orthostatic hypotension.  Discontinue IV fluids.  We cut back on some of the blood pressure medications.  Continue to monitor. 3. Weakness.  Physical therapy recommends rehab. 4. 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  Platelet count up a little bit to 121. 5. Chronic kidney disease stage II.  GFR today 60 with creatinine better at 1.24. 6. History of bipolar disorder on Zoloft 7. History of CAD, peripheral vascular disease, history of TAVR.  Continue Coreg and Crestor 8. History of COPD 9. Type 2 diabetes mellitus with hyperlipidemia unspecified on Crestor.  Continue low-dose Lantus. 10. Anemia of chronic disease.  Today's hemoglobin 9.5.        Code Status:     Code Status Orders  (From admission, onward)         Start     Ordered   05/06/21 0822  Full code  Continuous        05/06/21 0821        Code Status History    Date Active Date Inactive Code Status Order ID Comments User Context   03/20/2021 0731 03/28/2021 2142 Full Code [id number]  Ivor Costa, MD ED   02/15/2021 1641 02/22/2021 2133 Full Code [id number]  Barb Merino, MD Inpatient   02/13/2021 1759 02/15/2021 1640 DNR [id number]  Rhetta Mura, DO Inpatient   11/21/2020 2137 11/22/2020 2249 DNR [id number]  Lenore Cordia, MD ED   Advance Care Planning Activity    Advance Directive Documentation   Flowsheet Row Most Recent Value  Type of Advance Directive Healthcare Power of Attorney  Pre-existing out of facility DNR order (yellow form or pink MOST form) --  "MOST" Form in Place? --     Family Communication: Spoke with patient's wife on the phone Disposition Plan: Status is: Inpatient  Dispo: The patient is from: Home              Anticipated d/c is to: Rehab  Patient currently will reassess toe cellulitis tomorrow to see improvement or not with IV antibiotics.   Difficult to place patient.  Hopefully  not  Consultants:  Podiatry  Procedures:  Bedside toe debridement  Antibiotics:  Vancomycin  Rocephin  Time spent: 26 minutes  [place]

## 2021-05-08 NOTE — Progress Notes (Signed)
Mobility Specialist - Progress Note   05/08/21 1100  Mobility  Activity Stood at bedside  Range of Motion/Exercises Right leg;Left leg (standing march)  Level of Assistance Minimal assist, patient does 75% or more  Distance Ambulated (ft) 0 ft  Mobility  (Stood with Assist)  Mobility Response Tolerated well  Mobility performed by Mobility specialist  $Mobility charge 1 Mobility    Supine BP = 163/64 Sitting BP = 151/81 Standing BP = 140/71   Pt lying in bed upon arrival. HR 63, O2 100% on RA. Orthostatic VS monitored and recorded above. RW in use for session. Dizzy while EOB that did resolve, denied dizziness in standing. Pt marched in place for 15 seconds before returning to seated position. Reports feeling "wobbly and unbalanced" d/t recent toe AMP, requiring minA. ModI for bed mobility. Denies pain at this time.    Kathee Delton Mobility Specialist 05/08/21, 11:14 AM

## 2021-05-09 DIAGNOSIS — R55 Syncope and collapse: Secondary | ICD-10-CM | POA: Diagnosis not present

## 2021-05-09 DIAGNOSIS — E1169 Type 2 diabetes mellitus with other specified complication: Secondary | ICD-10-CM

## 2021-05-09 DIAGNOSIS — R531 Weakness: Secondary | ICD-10-CM | POA: Diagnosis not present

## 2021-05-09 DIAGNOSIS — I951 Orthostatic hypotension: Secondary | ICD-10-CM | POA: Diagnosis not present

## 2021-05-09 DIAGNOSIS — L03031 Cellulitis of right toe: Secondary | ICD-10-CM | POA: Diagnosis not present

## 2021-05-09 LAB — BASIC METABOLIC PANEL
Anion gap: 7 (ref 5–15)
BUN: 43 mg/dL — ABNORMAL HIGH (ref 8–23)
CO2: 25 mmol/L (ref 22–32)
Calcium: 9 mg/dL (ref 8.9–10.3)
Chloride: 106 mmol/L (ref 98–111)
Creatinine, Ser: 1.13 mg/dL (ref 0.61–1.24)
GFR, Estimated: >60 mL/min (ref >60)
Glucose, Bld: 144 mg/dL — ABNORMAL HIGH (ref 70–99)
Potassium: 4.5 mmol/L (ref 3.5–5.1)
Sodium: 138 mmol/L (ref 135–145)

## 2021-05-09 LAB — RESP PANEL BY RT-PCR (FLU A&B, COVID) ARPGX2
Influenza A by PCR: NEGATIVE
Influenza B by PCR: NEGATIVE
SARS Coronavirus 2 by RT PCR: NEGATIVE

## 2021-05-09 LAB — GLUCOSE, CAPILLARY
Glucose-Capillary: 132 mg/dL — ABNORMAL HIGH (ref 70–99)
Glucose-Capillary: 214 mg/dL — ABNORMAL HIGH (ref 70–99)

## 2021-05-09 LAB — SEDIMENTATION RATE: Sed Rate: 67 mm/hr — ABNORMAL HIGH (ref 0–20)

## 2021-05-09 MED ORDER — SULFAMETHOXAZOLE-TRIMETHOPRIM 800-160 MG PO TABS: 1.0000 | ORAL_TABLET | Freq: Two times a day (BID) | ORAL | 0 refills | Status: AC

## 2021-05-09 MED ORDER — ACETAMINOPHEN 325 MG PO TABS: 650.0000 mg | ORAL_TABLET | Freq: Four times a day (QID) | ORAL | Status: AC | PRN

## 2021-05-09 MED ORDER — INSULIN GLARGINE 100 UNIT/ML SOLOSTAR PEN
18.0000 [IU] | PEN_INJECTOR | Freq: Every day | SUBCUTANEOUS | 11 refills | Status: DC
Start: 2021-05-09 — End: 2021-06-02

## 2021-05-09 MED ORDER — ALBUTEROL SULFATE HFA 108 (90 BASE) MCG/ACT IN AERS: 2.0000 | INHALATION_SPRAY | RESPIRATORY_TRACT | 0 refills | Status: AC | PRN

## 2021-05-09 MED ORDER — VANCOMYCIN HCL 1500 MG/300ML IV SOLN
1500.0000 mg | INTRAVENOUS | Status: DC
  Administered 2021-05-09: 1500 mg via INTRAVENOUS
  Filled 2021-05-09 (×2): qty 300

## 2021-05-09 NOTE — TOC Progression Note (Signed)
Transition of Care [facility]) - Progression Note    Patient Details  Name: Nathan Singleton MRN: [id number] Date of Birth: 27-Sep-1941  Transition of Care [facility]) CM/SW Contact  Anselm Pancoast, RN Phone Number: 05/09/2021, 10:00 AM  Clinical Narrative:    Spoke with patient at bedside who was very pleasant and open to discuss option of SNF vs HHC. Patient confirmed he had [facility] before with [facility] and would like to resume that service. Lives at home with his fiance who provides any care he needs including food and appointments. Fiance assisted with IV-Antibiotics via PICC line at last admission.    Expected Discharge Plan: [place] Barriers to Discharge: Continued Medical Work up  Expected Discharge Plan and Services Expected Discharge Plan: [place] arrangements for the past 2 months: Single Family Home                           HH Arranged: RN [facility] Agency: [place] Date [facility] Agency Contacted: 05/07/21 Time HH Agency Contacted: 0900 Representative spoke with at [place]: [place] Determinants of Health ([place]) Interventions    Readmission Risk Interventions Readmission Risk Prevention Plan 05/07/2021 03/23/2021 02/14/2021  Transportation Screening Complete Complete Complete  Medication Review Press photographer) Complete Complete Complete  PCP or Specialist appointment within 3-5 days of discharge Complete Complete Complete  HRI or Home Care Consult Complete Complete Complete  SW Recovery Care/Counseling Consult Complete Complete Complete  Palliative Care Screening Not Applicable Not Applicable Complete  [place] Not Applicable Not Applicable Complete

## 2021-05-09 NOTE — TOC Progression Note (Addendum)
Transition of Care [facility]) - Progression Note    Patient Details  Name: Nathan Singleton MRN: [id number] Date of Birth: [birth year]  Transition of Care [facility]) CM/SW [place], LCSW Phone Number: 05/09/2021, 12:43 PM  Clinical Narrative: Per MD, patient and significant other are agreeable to SNF placement. CSW tried calling patient in room but no answer. Called significant other and she confirmed. Reviewed bed offers and CMS scores. They prefer [facility]. Left message for admissions coordinator to notify and sent secure chat to MD to see if we have an anticipated discharge date. Per significant other, patient is fully vaccinated with booster and she will bring card to SNF when discharged.  1:45 pm: Patient is stable for discharge. Started Ship broker. RN will complete rapid COVID test.  2:47 pm: Auth approved: [id number]. Doubt insurance will cover EMS transport with how well patient walked today. Significant other willing to transport him by car if SNF will allow it. Left voicemail for admissions coordinator.  Expected Discharge Plan: [place] Barriers to Discharge: Continued Medical Work up  Expected Discharge Plan and Services Expected Discharge Plan: [place] arrangements for the past 2 months: Single Family Home                           HH Arranged: RN [facility] Agency: [place] Date [facility] Agency Contacted: 05/07/21 Time HH Agency Contacted: 0900 Representative spoke with at [place]: [place] Determinants of Health ([place]) Interventions    Readmission Risk Interventions Readmission Risk Prevention Plan 05/07/2021 03/23/2021 02/14/2021  Transportation Screening Complete Complete Complete  Medication Review Press photographer) Complete Complete Complete  PCP or Specialist appointment within 3-5 days of discharge Complete Complete Complete  HRI or Home Care Consult Complete Complete  Complete  SW Recovery Care/Counseling Consult Complete Complete Complete  Palliative Care Screening Not Applicable Not Applicable Complete  [place] Not Applicable Not Applicable Complete

## 2021-05-09 NOTE — Discharge Summary (Signed)
Nathan Singleton NAME: Nathan Singleton    MR#:  [id number]  DATE OF BIRTH:  April 08, 1941  DATE OF ADMISSION:  05/06/2021 ADMITTING PHYSICIAN: Ivor Costa, MD  DATE OF DISCHARGE: 05/09/2021  PRIMARY CARE PHYSICIAN: Ranae Plumber, PA    ADMISSION DIAGNOSIS:  Syncope [R55] CAP (community acquired pneumonia) [J18.9] Syncope, unspecified syncope type [R55] Community acquired pneumonia, unspecified laterality [J18.9]  DISCHARGE DIAGNOSIS:  Principal Problem:   Syncope, vasovagal Active Problems:   Hypertension   CAD (coronary artery disease)   Thrombocytopenia (HCC)   COPD (chronic obstructive pulmonary disease) (HCC)   Bipolar disorder (HCC)   Depression   Gout   Hyperlipidemia   BPH (benign prostatic hyperplasia)   Cellulitis of second toe of right foot   Anemia of chronic disease   CAP (community acquired pneumonia)   CKD (chronic kidney disease), stage IIIa   Diarrhea   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 Aneurysm (arteriovenous) of coronary vessels   . Arthritis   . Ascending aortic aneurysm ([place])   . CAD (coronary artery disease)   . Diabetes mellitus without complication ([place])   . History of GI bleed   . Hyperlipidemia associated with type 2 diabetes mellitus ([place])   . Hypertension associated with diabetes ([place])   . Presence of permanent cardiac pacemaker   . PVD (peripheral vascular disease) ([place])   . S/P TAVR (transcatheter aortic valve replacement)     HOSPITAL COURSE:   1.  Second toe cellulitis.  Dr. Luana Shu from podiatry did a bedside debridement.  Blood cultures are negative.  Patient was started on vancomycin and Rocephin.  Sedimentation rate still elevated at 67.  Prior back in April was 30.  Case discussed with pharmacist since the patient has an allergy to doxycycline I will have to give Bactrim double strength  twice a day for 1 week.  Recommend checking a BMP in 3 days.  Follow-up Dr. Cleda Mccreedy podiatry in 1 week.  Cover toe wounds with nonstick dressing, sterile 4 x 4's and Kerlix wrap.  Today when I looked at the wound it did have a little blood the patient stated that he hit it on the floor.  Wearing walking shoe with ambulation. 2.  Syncope secondary to orthostatic hypotension.  We gave him IV fluids during the hospital stay.  Can go back on Lasix low-dose as outpatient. 3.  Weakness.  Physical therapy recommending rehab. 4.  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  Last platelet count 121. 5.  Chronic kidney disease stage II.  Creatinine improved from 1.4 down to 1.13 with IV fluids. 6.  History of bipolar disorder on Zoloft. 7.  History of CAD, peripheral vascular disease, history of TAVR.  Continue Coreg and Crestor 8.  History of COPD 9.  Type 2 diabetes mellitus with hyperlipidemia unspecified on Crestor.  Continue Lantus insulin. 10.  Anemia of chronic disease.  Last hemoglobin 9.5.  DISCHARGE CONDITIONS:   Satisfactory  CONSULTS OBTAINED:  Treatment Team:  Caroline More, DPM  DRUG ALLERGIES:   Allergies  Allergen Reactions  . Atorvastatin Other (See Comments)    "bad for kidneys" Cramping   . Celecoxib Other (See Comments)    Other reaction(s): Other (See Comments) Kidney Problem Kidney Problem   . Dicyclomine Hcl Other (See Comments)    Stomach cramps  . Doxycycline Other (See Comments)  Per wife (per RN)- pt gets severe stomach pains even when takes with food and PCP said to not take    . Metformin Other (See Comments)    DISCHARGE MEDICATIONS:   Allergies as of 05/09/2021      Reactions   Atorvastatin Other (See Comments)   "bad for kidneys" Cramping   Celecoxib Other (See Comments)   Other reaction(s): Other (See Comments) Kidney Problem Kidney Problem   Dicyclomine Hcl Other (See Comments)   Stomach cramps   Doxycycline Other (See Comments)   Per wife (per  RN)- pt gets severe stomach pains even when takes with food and PCP said to not take   Metformin Other (See Comments)      Medication List    STOP taking these medications   cephALEXin 500 MG capsule Commonly known as: KEFLEX   docusate sodium 100 MG capsule Commonly known as: COLACE   Linzess 145 MCG Caps capsule Generic drug: linaclotide     TAKE these medications   acetaminophen 325 MG tablet Commonly known as: TYLENOL Take 2 tablets (650 mg total) by mouth every 6 (six) hours as needed for fever, headache or moderate pain.   albuterol 108 (90 Base) MCG/ACT inhaler Commonly known as: VENTOLIN HFA Inhale 2 puffs into the lungs every 4 (four) hours as needed for wheezing or shortness of breath.   allopurinol 100 MG tablet Commonly known as: ZYLOPRIM Take 100 mg by mouth daily.   amLODipine 5 MG tablet Commonly known as: NORVASC Take 1 tablet (5 mg total) by mouth daily.   baclofen 10 MG tablet Commonly known as: LIORESAL Take 10 mg by mouth 2 (two) times daily.   Bayer Aspirin EC Low Dose 81 MG EC tablet Generic drug: aspirin Take 81 mg by mouth daily.   carvedilol 6.25 MG tablet Commonly known as: COREG Take 6.25 mg by mouth at bedtime.   cetirizine 10 MG tablet Commonly known as: ZYRTEC Take 10 mg by mouth daily.   clopidogrel 75 MG tablet Commonly known as: PLAVIX Take 1 tablet (75 mg total) by mouth daily.   D-5000 125 MCG (5000 UT) Tabs Generic drug: Cholecalciferol Take 1 tablet by mouth daily.   furosemide 20 MG tablet Commonly known as: LASIX Take 20 mg by mouth daily.   gabapentin 100 MG capsule Commonly known as: NEURONTIN Take 100 mg by mouth daily.   insulin glargine 100 UNIT/ML Solostar Pen Commonly known as: LANTUS Inject 18 Units into the skin at bedtime. What changed: how much to take   isosorbide mononitrate 30 MG 24 hr tablet Commonly known as: IMDUR Take 30 mg by mouth daily.   loperamide 2 MG capsule Commonly known as:  IMODIUM Take 1 capsule (2 mg total) by mouth as needed for diarrhea or loose stools.   multivitamin Tabs tablet Take 1 tablet by mouth daily.   pantoprazole 40 MG tablet Commonly known as: PROTONIX Take 1 tablet (40 mg total) by mouth daily.   rosuvastatin 40 MG tablet Commonly known as: CRESTOR Take 40 mg by mouth daily.   sertraline 100 MG tablet Commonly known as: ZOLOFT Take 200 mg by mouth daily.   sulfamethoxazole-trimethoprim 800-160 MG tablet Commonly known as: BACTRIM DS Take 1 tablet by mouth 2 (two) times daily for 7 days.   tamsulosin 0.4 MG Caps capsule Commonly known as: FLOMAX Take 0.4 mg by mouth daily.        DISCHARGE INSTRUCTIONS:   Follow-up team at rehab 1 day Follow-up Dr.  [facility] podiatry 1 week  If you experience worsening of your admission symptoms, develop shortness of breath, life threatening emergency, suicidal or homicidal thoughts you must seek medical attention immediately by calling 911 or calling your MD immediately  if symptoms less severe.  You Must read complete instructions/literature along with all the possible adverse reactions/side effects for all the Medicines you take and that have been prescribed to you. Take any new Medicines after you have completely understood and accept all the possible adverse reactions/side effects.   Please note  You were cared for by a hospitalist during your hospital stay. If you have any questions about your discharge medications or the care you received while you were in the hospital after you are discharged, you can call the unit and asked to speak with the hospitalist on call if the hospitalist that took care of you is not available. Once you are discharged, your primary care physician will handle any further medical issues. Please note that NO REFILLS for any discharge medications will be authorized once you are discharged, as it is imperative that you return to your primary care physician (or establish a  relationship with a primary care physician if you do not have one) for your aftercare needs so that they can reassess your need for medications and monitor your lab values.    Today   CHIEF COMPLAINT:   Chief Complaint  Patient presents with  . Loss of Consciousness    HISTORY OF PRESENT ILLNESS:  Nathan Singleton  is a 80 y.o. male came in with syncope and found to be orthostatic   VITAL SIGNS:  Blood pressure 130/72, pulse 65, temperature 98.5 F (36.9 C), temperature source Oral, resp. rate 18, height 6' (1.829 m), weight 113.4 kg, SpO2 98 %.  I/O:    Intake/Output Summary (Last 24 hours) at 05/09/2021 1330 Last data filed at 05/09/2021 0900 Gross per 24 hour  Intake 2138.27 ml  Output 1500 ml  Net 638.27 ml    PHYSICAL EXAMINATION:  GENERAL:  80 y.o.-year-old patient lying in the bed with no acute distress.  EYES: Pupils equal, round, reactive to light and accommodation. No scleral icterus. Extraocular muscles intact.  HEENT: Head atraumatic, normocephalic. Oropharynx and nasopharynx clear.  LUNGS: Normal breath sounds bilaterally, no wheezing, rales,rhonchi or crepitation. No use of accessory muscles of respiration.  CARDIOVASCULAR: S1, S2 normal. No murmurs, rubs, or gallops.  ABDOMEN: Soft, non-tender, non-distended. Bowel sounds present. No organomegaly or mass.  EXTREMITIES: No pedal edema, cyanosis, or clubbing.  NEUROLOGIC: Cranial nerves II through XII are intact. Muscle strength 5/5 in all extremities. Sensation intact. Gait not checked.  PSYCHIATRIC: The patient is alert and oriented x 3.  SKIN: Slight blood seen on first and second toe where debridement occurred.  DATA REVIEW:   CBC Recent Labs  Lab 05/08/21 0456  WBC 4.0  HGB 9.5*  HCT 28.0*  PLT 121*    Chemistries  Recent Labs  Lab 05/06/21 0150 05/08/21 0456 05/09/21 0552  NA 139   < > 138  K 4.7   < > 4.5  CL 110   < > 106  CO2 22   < > 25  GLUCOSE 160*   < > 144*  BUN 55*   < > 43*   CREATININE 1.40*   < > 1.13  CALCIUM 8.6*   < > 9.0  AST 49*  --   --   ALT 30  --   --   ALKPHOS 106  --   --  BILITOT 0.5  --   --    < > = values in this interval not displayed.     Microbiology Results  Results for orders placed or performed during the hospital encounter of 05/06/21  Resp Panel by RT-PCR (Flu A&B, Covid) Nasopharyngeal Swab     Status: None   Collection Time: 05/06/21  2:19 AM   Specimen: Nasopharyngeal Swab; Nasopharyngeal(NP) swabs in vial transport medium  Result Value Ref Range Status   SARS Coronavirus 2 by RT PCR NEGATIVE NEGATIVE Final    Comment: (NOTE) SARS-CoV-2 target nucleic acids are NOT DETECTED.  The SARS-CoV-2 RNA is generally detectable in upper respiratory specimens during the acute phase of infection. The lowest concentration of SARS-CoV-2 viral copies this assay can detect is 138 copies/mL. A negative result does not preclude SARS-Cov-2 infection and should not be used as the sole basis for treatment or other patient management decisions. A negative result may occur with  improper specimen collection/handling, submission of specimen other than nasopharyngeal swab, presence of viral mutation(s) within the areas targeted by this assay, and inadequate number of viral copies(<138 copies/mL). A negative result must be combined with clinical observations, patient history, and epidemiological information. The expected result is Negative.  Fact Sheet for Patients:  EntrepreneurPulse.com.au  Fact Sheet for Healthcare Providers:  IncredibleEmployment.be  This test is no t yet approved or cleared by the [place] FDA and  has been authorized for detection and/or diagnosis of SARS-CoV-2 by FDA under an Emergency Use Authorization (EUA). This EUA will remain  in effect (meaning this test can be used) for the duration of the COVID-19 declaration under Section 564(b)(1) of the Act, 21 U.S.C.section  360bbb-3(b)(1), unless the authorization is terminated  or revoked sooner.       Influenza A by PCR NEGATIVE NEGATIVE Final   Influenza B by PCR NEGATIVE NEGATIVE Final    Comment: (NOTE) The Xpert Xpress SARS-CoV-2/FLU/RSV plus assay is intended as an aid in the diagnosis of influenza from Nasopharyngeal swab specimens and should not be used as a sole basis for treatment. Nasal washings and aspirates are unacceptable for Xpert Xpress SARS-CoV-2/FLU/RSV testing.  Fact Sheet for Patients: EntrepreneurPulse.com.au  Fact Sheet for Healthcare Providers: IncredibleEmployment.be  This test is not yet approved or cleared by the [place] FDA and has been authorized for detection and/or diagnosis of SARS-CoV-2 by FDA under an Emergency Use Authorization (EUA). This EUA will remain in effect (meaning this test can be used) for the duration of the COVID-19 declaration under Section 564(b)(1) of the Act, 21 U.S.C. section 360bbb-3(b)(1), unless the authorization is terminated or revoked.  Performed at [facility], [place], [place] [address]   C Difficile Quick Screen w PCR reflex     Status: None   Collection Time: 05/06/21 11:29 PM   Specimen: STOOL  Result Value Ref Range Status   C Diff antigen NEGATIVE NEGATIVE Final   C Diff toxin NEGATIVE NEGATIVE Final   C Diff interpretation No C. difficile detected.  Final    Comment: Performed at [facility], [address] [place], [place] [address]  Gastrointestinal Panel by PCR , Stool     Status: None   Collection Time: 05/06/21 11:29 PM   Specimen: Stool  Result Value Ref Range Status   Campylobacter species NOT DETECTED NOT DETECTED Final   Plesimonas shigelloides NOT DETECTED NOT DETECTED Final   Salmonella species NOT DETECTED NOT DETECTED Final   Yersinia enterocolitica NOT DETECTED  NOT DETECTED Final   Vibrio species NOT DETECTED NOT DETECTED Final    Vibrio cholerae NOT DETECTED NOT DETECTED Final   Enteroaggregative E coli (EAEC) NOT DETECTED NOT DETECTED Final   Enteropathogenic E coli (EPEC) NOT DETECTED NOT DETECTED Final   Enterotoxigenic E coli (ETEC) NOT DETECTED NOT DETECTED Final   Shiga like toxin producing E coli (STEC) NOT DETECTED NOT DETECTED Final   Shigella/Enteroinvasive E coli (EIEC) NOT DETECTED NOT DETECTED Final   Cryptosporidium NOT DETECTED NOT DETECTED Final   Cyclospora cayetanensis NOT DETECTED NOT DETECTED Final   Entamoeba histolytica NOT DETECTED NOT DETECTED Final   Giardia lamblia NOT DETECTED NOT DETECTED Final   Adenovirus F40/41 NOT DETECTED NOT DETECTED Final   Astrovirus NOT DETECTED NOT DETECTED Final   Norovirus GI/GII NOT DETECTED NOT DETECTED Final   Rotavirus A NOT DETECTED NOT DETECTED Final   Sapovirus (I, II, IV, and V) NOT DETECTED NOT DETECTED Final    Comment: Performed at [facility], [address] [place], [place] [address]  CULTURE, BLOOD (ROUTINE X 2) w Reflex to ID Panel     Status: None (Preliminary result)   Collection Time: 05/07/21  3:38 PM   Specimen: BLOOD  Result Value Ref Range Status   Specimen Description BLOOD BLOOD LEFT FOREARM  Final   Special Requests   Final    BOTTLES DRAWN AEROBIC AND ANAEROBIC Blood Culture results may not be optimal due to an inadequate volume of blood received in culture bottles   Culture   Final    NO GROWTH 2 DAYS Performed at [facility], [address] [place], [place] [address]    Report Status PENDING  Incomplete  CULTURE, BLOOD (ROUTINE X 2) w Reflex to ID Panel     Status: None (Preliminary result)   Collection Time: 05/07/21  3:38 PM   Specimen: BLOOD  Result Value Ref Range Status   Specimen Description BLOOD BLOOD RIGHT HAND  Final   Special Requests   Final    BOTTLES DRAWN AEROBIC AND ANAEROBIC Blood Culture results may not be optimal due to an inadequate volume of blood received in culture bottles    Culture   Final    NO GROWTH 2 DAYS Performed at [facility], [address] [place], [place] [address]    Report Status PENDING  Incomplete     Management plans discussed with the patient, family and they are in agreement.  CODE STATUS:     Code Status Orders  (From admission, onward)         Start     Ordered   05/06/21 0822  Full code  Continuous        05/06/21 0821        Code Status History    Date Active Date Inactive Code Status Order ID Comments User Context   03/20/2021 0731 03/28/2021 2142 Full Code [id number]  Ivor Costa, MD ED   02/15/2021 1641 02/22/2021 2133 Full Code [id number]  Barb Merino, MD Inpatient   02/13/2021 1759 02/15/2021 1640 DNR [id number]  Rhetta Mura, DO Inpatient   11/21/2020 2137 11/22/2020 2249 DNR [id number]  Lenore Cordia, MD ED   Advance Care Planning Activity    Advance Directive Documentation   Flowsheet Row Most Recent Value  Type of Advance Directive Healthcare Power of Attorney  Pre-existing out of facility DNR order (yellow form or pink MOST form) --  "MOST" Form in Place? --  TOTAL TIME TAKING CARE OF THIS PATIENT: 35 minutes.    Loletha Grayer M.D on 05/09/2021 at 1:30 PM  Between 7am to 6pm - Pager - [phone]  After 6pm go to [email or web address] - password [email or web address]  [facility]  CC: Primary care physician; Ranae Plumber, [place]

## 2021-05-09 NOTE — Progress Notes (Signed)
Pharmacy Antibiotic Note  Nathan Singleton is a 80 y.o. male admitted on 05/06/2021 with cellulitis. 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 Pharmacy has been consulted for vancomycin dosing. Patient is also on ceftriaxone. His renal function appears to be at or near his baseline level.  Plan:   Adjust vancomycin to 1500 mg IV q24h  Goal AUC 400-550  Expected AUC: 494.7  SCr used: 1.13 mg/dL  Css (calculated): 36.6 / 11 mcg/mL  Daily renal function assessment while on IV vancomycin   Height: 6' (182.9 cm) Weight: 113.4 kg (250 lb) IBW/kg (Calculated) : 77.6  Temp (24hrs), Avg:98.7 F (37.1 C), Min:98 F (36.7 C), Max:99.2 F (37.3 C)  Recent Labs  Lab 05/06/21 0150 05/07/21 0544 05/08/21 0456 05/09/21 0552  WBC 4.5 3.7* 4.0  --   CREATININE 1.40*  --  1.24 1.13    Estimated Creatinine Clearance: 70 mL/min (by C-G formula based on SCr of 1.13 mg/dL).    Allergies  Allergen Reactions  . Atorvastatin Other (See Comments)    "bad for kidneys" Cramping   . Celecoxib Other (See Comments)    Other reaction(s): Other (See Comments) Kidney Problem Kidney Problem   . Dicyclomine Hcl Other (See Comments)    Stomach cramps  . Doxycycline Other (See Comments)    Per wife (per RN)- pt gets severe stomach pains even when takes with food and PCP said to not take    . Metformin Other (See Comments)    Antimicrobials this admission: vancomycin 5/25 >>  ceftriaxone 5/25 >>   Microbiology results: 5/24 C diff: Ag, toxin negative 5/24 GI panel negative 5/24 SARS CoV-2: negative 5/24 influenza A/B: negative 5/25 BCx: NGTD  Thank you for allowing pharmacy to be a part of this patient's care.  Benita Gutter 05/09/2021 3:06 PM

## 2021-05-09 NOTE — Progress Notes (Signed)
PODIATRY / FOOT AND ANKLE SURGERY PROGRESS NOTE  Requesting Physician: Dr. Leslye Peer  Reason for consult: Right foot wound/callus  Chief Complaint: Right foot wounds   HPI: Nathan Singleton is a 80 y.o. male who presents today resting in bedside chair comfortably.  Patient states that he has some right foot pain but overall is doing well.  Patient has dressings applied to the right foot today and is wearing a surgical shoe.  Patient notes that he is planning on being discharged today and going to an outpatient facility.  PMHx:  Past Medical History:  Diagnosis Date  . Aneurysm (arteriovenous) of coronary vessels   . Arthritis   . Ascending aortic aneurysm ([place])   . CAD (coronary artery disease)   . Diabetes mellitus without complication ([place])   . History of GI bleed   . Hyperlipidemia associated with type 2 diabetes mellitus ([place])   . Hypertension associated with diabetes ([place])   . Presence of permanent cardiac pacemaker   . PVD (peripheral vascular disease) ([place])   . S/P TAVR (transcatheter aortic valve replacement)     Surgical Hx:  Past Surgical History:  Procedure Laterality Date  . AMPUTATION TOE Right 03/20/2021   Procedure: AMPUTATION TOE;  Surgeon: Sharlotte Alamo, DPM;  Location: [facility] ORS;  Service: Podiatry;  Laterality: Right;  . Ankle repaired Right   . Carpal Tunnel repaired Left   . CATARACT EXTRACTION Bilateral   . COLONOSCOPY    . Copillar implant     . DG CHOLECYSTOGRAPHY GALL BLADDER ([place] [facility])    . GASTRIC BYPASS    . heart valve replaced    . IRRIGATION AND DEBRIDEMENT FOOT Right 02/18/2021   Procedure: IRRIGATION AND DEBRIDEMENT FOOT-Right Great Toe;  Surgeon: [name], DPM;  Location: [facility] OR[facility];  Service: Podiatry;  Laterality: Right;  . LOWER EXTREMITY ANGIOGRAPHY Right 02/17/2021   Procedure: Lower Extremity Angiography;  Surgeon: Algernon Huxley, MD;  Location: [facility];  Service: Cardiovascular;  Laterality: Right;  . LOWER EXTREMITY ANGIOGRAPHY  Right 03/27/2021   Procedure: Lower Extremity Angiography;  Surgeon: Algernon Huxley, MD;  Location: [facility];  Service: Cardiovascular;  Laterality: Right;  . PACEMAKER GENERATOR CHANGE    . REPLACEMENT TOTAL KNEE BILATERAL    . RTC    . STENT PLACE LEFT URETER ([facility] HX) Right    Right Leg  . TEE WITHOUT CARDIOVERSION N/A 03/26/2021   Procedure: TRANSESOPHAGEAL ECHOCARDIOGRAM (TEE);  Surgeon: Kate Sable, MD;  Location: [facility] OR[facility];  Service: Cardiovascular;  Laterality: N/A;  . Toe nail removed Bilateral     FHx:  Family History  Problem Relation Age of Onset  . Heart disease Mother   . Alzheimer's disease Mother   . Heart disease Father   . Diabetes Father     Social History:  reports that he has quit smoking. He has never used smokeless tobacco. He reports previous alcohol use. He reports previous drug use.  Allergies:  Allergies  Allergen Reactions  . Atorvastatin Other (See Comments)    "bad for kidneys" Cramping   . Celecoxib Other (See Comments)    Other reaction(s): Other (See Comments) Kidney Problem Kidney Problem   . Dicyclomine Hcl Other (See Comments)    Stomach cramps  . Doxycycline Other (See Comments)    Per wife (per RN)- pt gets severe stomach pains even when takes with food and PCP said to not take    . Metformin Other (See Comments)    No  medications prior to admission.    Physical Exam: General: Alert and oriented.  No apparent distress.  Vascular: DP/PT pulses faintly palpable bilateral.  Feet do appear to be warm to touch bilateral.  No hair growth noted to digits bilateral.  Neuro: Light touch sensation reduced to bilateral lower extremities.  Derm: Blistered area over the dorsal aspect of the right hallux amputation site appears to be healing well at this time and appears to be getting smaller and measures approximately 0.3 x 0.2 cm by dermis.  No deep probing noted.  No erythema or edema, minimal serosanguineous  drainage.  Right second toe distal tip ulceration measures approximately 0.4 x 0.3 x 0.1 cm today and appears to be slightly hypergranular, no erythema, no edema, minimal serosanguineous drainage present, probes to subcutaneous tissue, no bone exposed.  No signs of infection present today.  Skin appears to be fairly thin and atrophic to both feet.  Small stable hemorrhagic callus formation to the dorsal lateral aspect of the PIPJ of the right fifth toe.  No signs of infection present.  No other open wounds or ulcerations present to bilateral lower extremities.  MSK: Right hallux amputation.  No pain on palpation of poor forefeet.  Results for orders placed or performed during the hospital encounter of 05/06/21 (from the past 48 hour(s))  Glucose, capillary     Status: Abnormal   Collection Time: 05/07/21  9:02 PM  Result Value Ref Range   Glucose-Capillary 213 (H) 70 - 99 mg/dL    Comment: Glucose reference range applies only to samples taken after fasting for at least 8 hours.  Basic metabolic panel     Status: Abnormal   Collection Time: 05/08/21  4:56 AM  Result Value Ref Range   Sodium 137 135 - 145 mmol/L   Potassium 4.2 3.5 - 5.1 mmol/L   Chloride 106 98 - 111 mmol/L   CO2 22 22 - 32 mmol/L   Glucose, Bld 120 (H) 70 - 99 mg/dL    Comment: Glucose reference range applies only to samples taken after fasting for at least 8 hours.   BUN 39 (H) 8 - 23 mg/dL   Creatinine, Ser 1.24 0.61 - 1.24 mg/dL   Calcium 8.7 (L) 8.9 - 10.3 mg/dL   GFR, Estimated 60 (L) >60 mL/min    Comment: (NOTE) Calculated using the CKD-EPI Creatinine Equation (2021)    Anion gap 9 5 - 15    Comment: Performed at [facility], [address] [place], [place] [address]  CBC     Status: Abnormal   Collection Time: 05/08/21  4:56 AM  Result Value Ref Range   WBC 4.0 4.0 - 10.5 K/uL   RBC 3.16 (L) 4.22 - 5.81 MIL/uL   Hemoglobin 9.5 (L) 13.0 - 17.0 g/dL   HCT 28.0 (L) 39.0 - 52.0 %   MCV 88.6  80.0 - 100.0 fL   MCH 30.1 26.0 - 34.0 pg   MCHC 33.9 30.0 - 36.0 g/dL   RDW 14.6 11.5 - 15.5 %   Platelets 121 (L) 150 - 400 K/uL    Comment: Immature Platelet Fraction may be clinically indicated, consider ordering this additional test [id number]    nRBC 0.0 0.0 - 0.2 %    Comment: Performed at [facility], [address] [place], [place] [address]  Glucose, capillary     Status: Abnormal   Collection Time: 05/08/21  7:35 AM  Result Value Ref Range   Glucose-Capillary 125 (H)  70 - 99 mg/dL    Comment: Glucose reference range applies only to samples taken after fasting for at least 8 hours.  Glucose, capillary     Status: Abnormal   Collection Time: 05/08/21 11:38 AM  Result Value Ref Range   Glucose-Capillary 224 (H) 70 - 99 mg/dL    Comment: Glucose reference range applies only to samples taken after fasting for at least 8 hours.   Comment 1 Notify RN   Glucose, capillary     Status: Abnormal   Collection Time: 05/08/21  4:36 PM  Result Value Ref Range   Glucose-Capillary 295 (H) 70 - 99 mg/dL    Comment: Glucose reference range applies only to samples taken after fasting for at least 8 hours.   Comment 1 Notify RN   Glucose, capillary     Status: Abnormal   Collection Time: 05/08/21  8:30 PM  Result Value Ref Range   Glucose-Capillary 158 (H) 70 - 99 mg/dL    Comment: Glucose reference range applies only to samples taken after fasting for at least 8 hours.  ESR     Status: Abnormal   Collection Time: 05/09/21  5:52 AM  Result Value Ref Range   Sed Rate 67 (H) 0 - 20 mm/hr    Comment: Performed at [facility], [address] [place], [place] [address]  Basic metabolic panel     Status: Abnormal   Collection Time: 05/09/21  5:52 AM  Result Value Ref Range   Sodium 138 135 - 145 mmol/L   Potassium 4.5 3.5 - 5.1 mmol/L   Chloride 106 98 - 111 mmol/L   CO2 25 22 - 32 mmol/L   Glucose, Bld 144 (H) 70 - 99 mg/dL    Comment: Glucose reference range  applies only to samples taken after fasting for at least 8 hours.   BUN 43 (H) 8 - 23 mg/dL   Creatinine, Ser 1.13 0.61 - 1.24 mg/dL   Calcium 9.0 8.9 - 10.3 mg/dL   GFR, Estimated >60 >60 mL/min    Comment: (NOTE) Calculated using the CKD-EPI Creatinine Equation (2021)    Anion gap 7 5 - 15    Comment: Performed at [facility], [address] [place], [place] [address]  Glucose, capillary     Status: Abnormal   Collection Time: 05/09/21  7:38 AM  Result Value Ref Range   Glucose-Capillary 132 (H) 70 - 99 mg/dL    Comment: Glucose reference range applies only to samples taken after fasting for at least 8 hours.  Glucose, capillary     Status: Abnormal   Collection Time: 05/09/21 11:44 AM  Result Value Ref Range   Glucose-Capillary 214 (H) 70 - 99 mg/dL    Comment: Glucose reference range applies only to samples taken after fasting for at least 8 hours.  Resp Panel by RT-PCR (Flu A&B, Covid) Nasopharyngeal Swab     Status: None   Collection Time: 05/09/21  2:00 PM   Specimen: Nasopharyngeal Swab; Nasopharyngeal(NP) swabs in vial transport medium  Result Value Ref Range   SARS Coronavirus 2 by RT PCR NEGATIVE NEGATIVE    Comment: (NOTE) SARS-CoV-2 target nucleic acids are NOT DETECTED.  The SARS-CoV-2 RNA is generally detectable in upper respiratory specimens during the acute phase of infection. The lowest concentration of SARS-CoV-2 viral copies this assay can detect is 138 copies/mL. A negative result does not preclude SARS-Cov-2 infection and should not be used as the sole basis for treatment or  other patient management decisions. A negative result may occur with  improper specimen collection/handling, submission of specimen other than nasopharyngeal swab, presence of viral mutation(s) within the areas targeted by this assay, and inadequate number of viral copies(<138 copies/mL). A negative result must be combined with clinical observations, patient history, and  epidemiological information. The expected result is Negative.  Fact Sheet for Patients:  EntrepreneurPulse.com.au  Fact Sheet for Healthcare Providers:  IncredibleEmployment.be  This test is no t yet approved or cleared by the [place] FDA and  has been authorized for detection and/or diagnosis of SARS-CoV-2 by FDA under an Emergency Use Authorization (EUA). This EUA will remain  in effect (meaning this test can be used) for the duration of the COVID-19 declaration under Section 564(b)(1) of the Act, 21 U.S.C.section 360bbb-3(b)(1), unless the authorization is terminated  or revoked sooner.       Influenza A by PCR NEGATIVE NEGATIVE   Influenza B by PCR NEGATIVE NEGATIVE    Comment: (NOTE) The Xpert Xpress SARS-CoV-2/FLU/RSV plus assay is intended as an aid in the diagnosis of influenza from Nasopharyngeal swab specimens and should not be used as a sole basis for treatment. Nasal washings and aspirates are unacceptable for Xpert Xpress SARS-CoV-2/FLU/RSV testing.  Fact Sheet for Patients: EntrepreneurPulse.com.au  Fact Sheet for Healthcare Providers: IncredibleEmployment.be  This test is not yet approved or cleared by the [place] FDA and has been authorized for detection and/or diagnosis of SARS-CoV-2 by FDA under an Emergency Use Authorization (EUA). This EUA will remain in effect (meaning this test can be used) for the duration of the COVID-19 declaration under Section 564(b)(1) of the Act, 21 U.S.C. section 360bbb-3(b)(1), unless the authorization is terminated or revoked.  Performed at [facility], [address] [place], [place] [address]    No results found.  Blood pressure 130/72, pulse 65, temperature 98.5 F (36.9 C), temperature source Oral, resp. rate 18, height 6' (1.829 m), weight 113.4 kg, SpO2 98 %.   Assessment 1. Dermal ulceration to the right first toe at  the first toe amputation site 2. Subcutaneous ulceration to the distal tip of the right second toe 3. Diabetes type 2 polyneuropathy 4. PVD  Plan -Patient seen and examined. -X-ray imaging and CT imaging reviewed.  Does not reveal any obvious osteomyelitis present. -Clinically wounds appear to be stable at this time after debridement, no probing to bone, no erythema, no edema, no signs of infection present overall. -Applied Betadine wet-to-dry dressings today and surgical shoe to be worn to the right foot when ambulating.  Patient should have dressing changes daily to every other day. -No further treatment indicated at this point and patient is to follow-up in outpatient clinic.  Podiatry team to sign off at this time.  Follow-up instructions placed in chart.  Caroline More, DPM 05/09/2021, 5:22 PM

## 2021-05-09 NOTE — Progress Notes (Signed)
Patient discharged with pertinent information, prescription and personal belongings. Wife taking information to [facility].  Report called to receiving nurse Leata Mouse at facility.  IV d/ced. No acute distress noted. Care relinquished.

## 2021-05-09 NOTE — Progress Notes (Signed)
Physical Therapy Treatment Patient Details Name: Nathan Singleton MRN: [id number] DOB: [birth year] Today's Date: 05/09/2021    History of Present Illness Patient is a 80 y.o. male with a history of CAD on Plavix and aspirin, sick sinus syndrome status post [employer] pacemaker, ascending aortic aneurysm, diabetes, peptic ulcer disease, GI bleeding, aortic valve replacement, hypertension, hyperlipidemia, chronic kidney disease who presents for evaluation of syncope.    PT Comments    Pt able to tolerate increased activity today, no dizziness reported with standing. Pt assisted with donning Right cast/walking shoe to protect distal foot.  Sit <> stand with CGA, gait with RW 290f with CG/MinA for occasional LOB due to shoe.  Reviewed POC, pt progress, and recommendations for short term SNF placement upon d/c, Pt and spouse in agreement.    Follow Up Recommendations  Supervision for mobility/OOB;SNF     Equipment Recommendations  None recommended by PT    Recommendations for Other Services       Precautions / Restrictions Precautions Precautions: Fall Restrictions Weight Bearing Restrictions: No Other Position/Activity Restrictions: patient recently had right toe amputation where he was heel weight bearing only with off loading shoe in place. per the patient report, he is no longer required to wear the off loading shoe    Mobility  Bed Mobility               General bed mobility comments: NT    Transfers Overall transfer level: Needs assistance Equipment used: Rolling walker (2 wheeled) Transfers: Sit to/from Stand Sit to Stand: Min guard            Ambulation/Gait Ambulation/Gait assistance: Min guard;Min assist Gait Distance (Feet): 200 Feet Assistive device: Rolling walker (2 wheeled) Gait Pattern/deviations:  (Slightly off balance due to donning of R cast shoe)         Stairs             Wheelchair Mobility    Modified Rankin (Stroke Patients  Only)       Balance                                            Cognition Arousal/Alertness: Awake/alert Behavior During Therapy: WFL for tasks assessed/performed Overall Cognitive Status: Within Functional Limits for tasks assessed                                 General Comments: 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      Exercises General Exercises - Lower Extremity Long Arc Quad: AROM;Both;10 reps;Seated Hip Flexion/Marching: AROM;Both;10 reps;Seated    General Comments General comments (skin integrity, edema, etc.):  (Pt and spouse educated on benefits of short term rehab with good understanding and in agreement.)      Pertinent Vitals/Pain Pain Assessment: No/denies pain    Home Living                      Prior Function            PT Goals (current goals can now be found in the care plan section) Acute Rehab PT Goals Patient Stated Goal: to get stronger Progress towards PT goals: Progressing toward goals    Frequency    Min 2X/week      PT Plan Current plan remains appropriate  Co-evaluation              AM-PAC PT "6 Clicks" Mobility   Outcome Measure  Help needed turning from your back to your side while in a flat bed without using bedrails?: A Little Help needed moving from lying on your back to sitting on the side of a flat bed without using bedrails?: A Little Help needed moving to and from a bed to a chair (including a wheelchair)?: A Little Help needed standing up from a chair using your arms (e.g., wheelchair or bedside chair)?: A Little Help needed to walk in hospital room?: A Little Help needed climbing 3-5 steps with a railing? : A Lot 6 Click Score: 17    End of Session Equipment Utilized During Treatment: Gait belt Activity Tolerance: Patient tolerated treatment well Patient left: in chair;with call bell/phone within reach;with family/visitor present Nurse Communication: Mobility status PT  Visit Diagnosis: Unsteadiness on feet (R26.81);History of falling (Z91.81);Muscle weakness (generalized) (M62.81)     Time: 1110-1200 PT Time Calculation (min) (ACUTE ONLY): 50 min  Charges:  $Gait Training: 23-37 mins $Therapeutic Activity: 8-22 mins                    Mikel Cella, PTA  Josie Dixon 05/09/2021, 12:48 PM

## 2021-05-09 NOTE — Progress Notes (Signed)
Mobility Specialist - Progress Note   05/09/21 1400  Mobility  Activity Refused mobility  Mobility performed by Mobility specialist    Pt politely declined mobility this date, no reason specified. Pt ambulated this AM with PT and had just transferred from recliner-bed upon arrival. Pt anticipating d/c later this date.     Kathee Delton Mobility Specialist 05/09/21, 2:17 PM

## 2021-05-09 NOTE — TOC Transition Note (Signed)
Transition of Care [facility]) - CM/SW Discharge Note   Patient Details  Name: Rawland Serino MRN: [id number] Date of Birth: [birth year]  [facility] CM/SW Contact:  Candie Chroman, LCSW Phone Number: 05/09/2021, 3:43 PM   Clinical Narrative:  Patient has orders to discharge to [facility] SNF today. RN will call report to [phone] (Room 234). Patient walked 200 feet with PT today so insurance likely will not cover EMS transport. Significant other willing to transport by car. Printed off [place] directions to facility and asked RN to give to her. RN aware that patient needs to be there before 5:00. No further concerns. CSW signing off.   Final next level of care: [facility] Barriers to Discharge: Barriers Resolved   Patient Goals and CMS Choice Patient states their goals for this hospitalization and ongoing recovery are:: To return home with Fiance. CMS Medicare.gov Compare Post Acute Care list provided to:: Patient ([facility]iance) Choice offered to / list presented to : Patient (Fiance)  Discharge Placement PASRR number recieved: 05/07/21            Patient chooses bed at: [facility] Patient to be transferred to facility by: Significant other will transport Name of family member notified: Henning Rupard Patient and family notified of of transfer: 05/09/21  Discharge Plan and Services                          HH Arranged: RN [facility] Agency: [place] Date [place]: 05/07/21 Time HH Agency Contacted: 0900 Representative spoke with at [place]: [place] Determinants of Health ([place]) Interventions     Readmission Risk Interventions Readmission Risk Prevention Plan 05/07/2021 03/23/2021 02/14/2021  Transportation Screening Complete Complete Complete  Medication Review Press photographer) Complete Complete Complete  PCP or Specialist appointment within 3-5 days of discharge Complete Complete Complete  HRI or  Home Care Consult Complete Complete Complete  SW Recovery Care/Counseling Consult Complete Complete Complete  Palliative Care Screening Not Applicable Not Applicable Complete  [place] Not Applicable Not Applicable Complete

## 2021-05-09 NOTE — Care Management Important Message (Signed)
Important Message  Patient Details  Name: Nathan Singleton MRN: [id number] Date of Birth: Dec 12, 1941   Medicare Important Message Given:  Other (see comment)  Patient is in an isolation room so I called his room [phone]) two times but no answer. Will try to call again before I leave.  Tried calling the room again and no answer.  I called his significant other, Nathan Singleton [phone] and she was in the patient's room but stated the hospital phone did not ring.  I will report to facilities to check the line.  She said he was in agreement with the discharge plan and did not need a copy.  I thanked her for her time and let her know I would report the issue.  Juliann Pulse A Erven Ramson 05/09/2021, 2:49 PM

## 2021-05-12 DIAGNOSIS — M47817 Spondylosis without myelopathy or radiculopathy, lumbosacral region: Secondary | ICD-10-CM | POA: Insufficient documentation

## 2021-05-12 LAB — CULTURE, BLOOD (ROUTINE X 2)
Culture: NO GROWTH
Culture: NO GROWTH

## 2021-05-12 NOTE — Progress Notes (Deleted)
No-show to procedure  

## 2021-05-13 ENCOUNTER — Telehealth: Payer: Self-pay

## 2021-05-13 ENCOUNTER — Ambulatory Visit: Payer: 59 | Admitting: Pain Medicine

## 2021-05-13 DIAGNOSIS — Z7901 Long term (current) use of anticoagulants: Secondary | ICD-10-CM

## 2021-05-13 DIAGNOSIS — M5136 Other intervertebral disc degeneration, lumbar region: Secondary | ICD-10-CM

## 2021-05-13 DIAGNOSIS — G8929 Other chronic pain: Secondary | ICD-10-CM

## 2021-05-13 DIAGNOSIS — M47816 Spondylosis without myelopathy or radiculopathy, lumbar region: Secondary | ICD-10-CM

## 2021-05-13 DIAGNOSIS — M47817 Spondylosis without myelopathy or radiculopathy, lumbosacral region: Secondary | ICD-10-CM

## 2021-05-13 NOTE — Telephone Encounter (Signed)
Called patient's girlfriend to reschedule EGD per Dr. Vicente Males. LVM to call office back.

## 2021-05-14 ENCOUNTER — Telehealth: Payer: Self-pay | Admitting: Gastroenterology

## 2021-05-14 ENCOUNTER — Other Ambulatory Visit: Payer: Self-pay

## 2021-05-14 DIAGNOSIS — K289 Gastrojejunal ulcer, unspecified as acute or chronic, without hemorrhage or perforation: Secondary | ICD-10-CM

## 2021-05-14 NOTE — Telephone Encounter (Signed)
Patient has been scheduled for EGD procedure. Instructions and blood thinner will be mailed out to patient.

## 2021-05-14 NOTE — Telephone Encounter (Signed)
Please call to schedule endoscopy

## 2021-05-23 ENCOUNTER — Encounter: Payer: Self-pay | Admitting: Emergency Medicine

## 2021-05-23 ENCOUNTER — Emergency Department: Payer: 59

## 2021-05-23 ENCOUNTER — Other Ambulatory Visit: Payer: Self-pay

## 2021-05-23 DIAGNOSIS — J449 Chronic obstructive pulmonary disease, unspecified: Secondary | ICD-10-CM | POA: Diagnosis not present

## 2021-05-23 DIAGNOSIS — Z96653 Presence of artificial knee joint, bilateral: Secondary | ICD-10-CM | POA: Insufficient documentation

## 2021-05-23 DIAGNOSIS — I129 Hypertensive chronic kidney disease with stage 1 through stage 4 chronic kidney disease, or unspecified chronic kidney disease: Secondary | ICD-10-CM | POA: Diagnosis not present

## 2021-05-23 DIAGNOSIS — Z79899 Other long term (current) drug therapy: Secondary | ICD-10-CM | POA: Insufficient documentation

## 2021-05-23 DIAGNOSIS — R002 Palpitations: Secondary | ICD-10-CM | POA: Insufficient documentation

## 2021-05-23 DIAGNOSIS — Z794 Long term (current) use of insulin: Secondary | ICD-10-CM | POA: Diagnosis not present

## 2021-05-23 DIAGNOSIS — E1122 Type 2 diabetes mellitus with diabetic chronic kidney disease: Secondary | ICD-10-CM | POA: Diagnosis not present

## 2021-05-23 DIAGNOSIS — Z7982 Long term (current) use of aspirin: Secondary | ICD-10-CM | POA: Insufficient documentation

## 2021-05-23 DIAGNOSIS — N1831 Chronic kidney disease, stage 3a: Secondary | ICD-10-CM | POA: Diagnosis not present

## 2021-05-23 DIAGNOSIS — I2510 Atherosclerotic heart disease of native coronary artery without angina pectoris: Secondary | ICD-10-CM | POA: Diagnosis not present

## 2021-05-23 DIAGNOSIS — Z87891 Personal history of nicotine dependence: Secondary | ICD-10-CM | POA: Diagnosis not present

## 2021-05-23 DIAGNOSIS — Z950 Presence of cardiac pacemaker: Secondary | ICD-10-CM | POA: Insufficient documentation

## 2021-05-23 DIAGNOSIS — Z7902 Long term (current) use of antithrombotics/antiplatelets: Secondary | ICD-10-CM | POA: Insufficient documentation

## 2021-05-23 DIAGNOSIS — R0602 Shortness of breath: Secondary | ICD-10-CM | POA: Diagnosis not present

## 2021-05-23 LAB — BASIC METABOLIC PANEL
Anion gap: 9 (ref 5–15)
BUN: 52 mg/dL — ABNORMAL HIGH (ref 8–23)
CO2: 22 mmol/L (ref 22–32)
Calcium: 8.5 mg/dL — ABNORMAL LOW (ref 8.9–10.3)
Chloride: 107 mmol/L (ref 98–111)
Creatinine, Ser: 1.34 mg/dL — ABNORMAL HIGH (ref 0.61–1.24)
GFR, Estimated: 54 mL/min — ABNORMAL LOW (ref >60)
Glucose, Bld: 275 mg/dL — ABNORMAL HIGH (ref 70–99)
Potassium: 4.4 mmol/L (ref 3.5–5.1)
Sodium: 138 mmol/L (ref 135–145)

## 2021-05-23 LAB — CBC
HCT: 32 % — ABNORMAL LOW (ref 39.0–52.0)
Hemoglobin: 10.6 g/dL — ABNORMAL LOW (ref 13.0–17.0)
MCH: 30 pg (ref 26.0–34.0)
MCHC: 33.1 g/dL (ref 30.0–36.0)
MCV: 90.7 fL (ref 80.0–100.0)
Platelets: 127 10*3/uL — ABNORMAL LOW (ref 150–400)
RBC: 3.53 MIL/uL — ABNORMAL LOW (ref 4.22–5.81)
RDW: 14.7 % (ref 11.5–15.5)
WBC: 5.7 10*3/uL (ref 4.0–10.5)
nRBC: 0 % (ref 0.0–0.2)

## 2021-05-23 LAB — TROPONIN I (HIGH SENSITIVITY): Troponin I (High Sensitivity): 11 ng/L (ref <18)

## 2021-05-23 NOTE — ED Notes (Signed)
Per ems pt states "he had funny beats", no resp distress noted per ems. Per ems pt with history of a fib, hr in 70s, per ems pt has not taken his medications today. Blood pressure 198/108, resps 16.

## 2021-05-23 NOTE — ED Triage Notes (Signed)
Pt reports he felt palpitations and called EMS and BP was elevated. P[t denies any chest pain, reports some shortness of breath. Pt talks in complete sentences no distress noted at present

## 2021-05-24 ENCOUNTER — Emergency Department
Admission: EM | Admit: 2021-05-24 | Discharge: 2021-05-24 | Disposition: A | Discharge status: Home / Self Care | Payer: 59 | Attending: Emergency Medicine | Admitting: Emergency Medicine

## 2021-05-24 DIAGNOSIS — R002 Palpitations: Secondary | ICD-10-CM | POA: Diagnosis not present

## 2021-05-24 LAB — TROPONIN I (HIGH SENSITIVITY): Troponin I (High Sensitivity): 16 ng/L (ref <18)

## 2021-05-24 NOTE — ED Provider Notes (Signed)
[facility] Emergency Department Provider Note  ____________________________________________   Event Date/Time   First MD Initiated Contact with Patient 05/24/21 [phone]     (approximate)  I have reviewed the triage vital signs and the nursing notes.   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  HPI Nathan Singleton is a 80 y.o. male with medical history as listed below who presents for evaluation of palpitations.  This occurred prior to his arrival in ED which unfortunately was more than 10 hours ago due to overwhelming ED and hospital patient volumes.  He said he felt his heart was beating too fast.  Nothing in particular made it better or worse but went away on its own.  However given his chronic medical issues and his age they decided to come in for evaluation.  He said he has not had any chest pain.  When his heart was beating fast he felt like he might be a little bit short of breath but not completely resolved once his heartbeat went back to normal range.  His wife states that he has a history of intermittent A. fib and his cardiologist is aware of it.  She said he has been compliant with all his medications.  He said he feels fine right now and has been fine for hours.     Past Medical History:  Diagnosis Date   Aneurysm (arteriovenous) of coronary vessels    Arthritis    Ascending aortic aneurysm (HCC)    CAD (coronary artery disease)    Diabetes mellitus without complication (HCC)    History of GI bleed    Hyperlipidemia associated with type 2 diabetes mellitus ([place])    Hypertension associated with diabetes ([place])    Presence of permanent cardiac pacemaker    PVD (peripheral vascular disease) ([place])    S/P TAVR (transcatheter aortic valve replacement)     Patient Active Problem List    Diagnosis Date Noted   Spondylosis without myelopathy or radiculopathy, lumbosacral region 05/12/2021   Orthostatic hypotension    Weakness    CAP (community acquired pneumonia) 05/06/2021   Syncope, vasovagal 05/06/2021   CKD (chronic kidney disease), stage IIIa 05/06/2021   Diarrhea 05/06/2021   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[place]) 05/06/2021   Osteoarthritis of facet joint at L5-S1 level of lumbosacral spine 04/22/2021   Lumbar facet joint syndrome (Bilateral) (L>R) 04/22/2021   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HCC)    MSSA bacteremia    Anemia of chronic disease    Cellulitis of second toe of right foot 03/20/2021   Type 2 diabetes mellitus with hyperlipidemia ([place]) 03/20/2021   CKD stage 3 due to type 2 diabetes mellitus ([place]) 03/20/2021   Dizziness 03/20/2021   Chronic pain syndrome 03/03/2021   Pharmacologic therapy 03/03/2021   Disorder of skeletal system 03/03/2021   Problems influencing health status [id number]   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[place]) 03/03/2021   Chronic lower extremity pain (2ry area of Pain) (Bilateral) (R>L) 03/03/2021   Chronic anticoagulation (Plavix) 03/03/2021   Chronic shoulder pain (3ry area of Pain) (Left) 03/03/2021   History of total knee replacement (Bilateral) 03/03/2021   DDD (degenerative disc disease), thoracic 03/03/2021   Cervical facet hypertrophy (Multilevel) (Bilateral) 03/03/2021   DDD (degenerative disc disease), cervical 03/03/2021   BPH (benign prostatic hyperplasia) 02/14/2021   Diabetic foot  ulcer ([place]) 02/13/2021   Normocytic anemia 11/26/2020   Chronic kidney disease (CKD) 11/26/2020   Claudication ([place]) 11/26/2020   Contusion of right thigh 11/26/2020   COPD (chronic obstructive pulmonary disease) ([place]) 11/26/2020   Depression 11/26/2020   Dyspnea 11/26/2020   Erectile dysfunction 11/26/2020   GERD without esophagitis 11/26/2020   Gout 11/26/2020   Hyperlipidemia, acquired 11/26/2020   Hyperlipidemia 11/26/2020   Myocardial  infarction ([place]) 11/26/2020   Personal history of other diseases of urinary system 11/26/2020   Right leg swelling 11/26/2020   Sleep apnea 11/26/2020   Varicose veins 11/26/2020   Venous insufficiency 11/26/2020   Vitamin D deficiency 11/26/2020   Chest pain 11/21/2020   Thrombocytopenia ([place]) 11/21/2020   S/P TAVR (transcatheter aortic valve replacement)    PVD (peripheral vascular disease) ([place])    Presence of permanent cardiac pacemaker    Hypertension associated with diabetes ([place])    Hyperlipidemia associated with type 2 diabetes mellitus ([place])    History of GI bleed    CAD (coronary artery disease)    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 11/19/2020   Pacemaker St Jude device 11/19/2020   Peripheral vascular disease, unspecified ([place]) stents to popliteal arteries many years ago 11/19/2020   History of gingival bleeding 11/19/2020   Coronary artery disease involving coronary bypass graft of native heart with angina pectoris ([place]) 11/19/2020   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 11/19/2020   Hypertension    DM2 (diabetes mellitus, type 2) ([place])    Arthritis    Closed nondisplaced fracture of greater trochanter of right femur ([place]) 01/25/2020   DDD (degenerative disc disease), lumbar 12/10/2019   Chronic low back pain (1ry area of Pain) (Bilateral) (L>R) w/o sciatica 12/09/2019   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   Urinary retention 12/09/2019   Hearing loss 05/11/2019   Obesity 04/07/2019   Retinopathy 04/07/2019   Deafness, left 01/12/2019   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/12/2019   Bipolar disorder ([place]) 01/01/2019   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   Heart block 04/25/2018   Calculus of gallbladder with acute cholecystitis without obstruction 09/21/2017   Gastrojejunal ulcer 03/23/2014   Insomnia 03/22/2014   Blisters of multiple sites 01/18/2014   Cellulitis 01/18/2014   OA (osteoarthritis)  of knee 06/09/2013   Status post percutaneous transluminal coronary angioplasty 01/14/2011    Past Surgical History:  Procedure Laterality Date   AMPUTATION TOE Right 03/20/2021   Procedure: AMPUTATION TOE;  Surgeon: Sharlotte Alamo, DPM;  Location: [facility] ORS;  Service: Podiatry;  Laterality: Right;   Ankle repaired Right    Carpal Tunnel repaired Left    CATARACT EXTRACTION Bilateral    COLONOSCOPY     Copillar implant      DG CHOLECYSTOGRAPHY GALL BLADDER ([facility] HX)     GASTRIC BYPASS     heart valve replaced     IRRIGATION AND DEBRIDEMENT FOOT Right 02/18/2021   Procedure: IRRIGATION AND DEBRIDEMENT FOOT-Right Great Toe;  Surgeon: [name], DPM;  Location: [facility] ORS;  Service: Podiatry;  Laterality: Right;   LOWER EXTREMITY ANGIOGRAPHY Right 02/17/2021   Procedure: Lower Extremity Angiography;  Surgeon: Algernon Huxley, MD;  Location: [facility];  Service: Cardiovascular;  Laterality: Right;   LOWER EXTREMITY ANGIOGRAPHY Right 03/27/2021   Procedure: Lower Extremity Angiography;  Surgeon: Algernon Huxley, MD;  Location: [facility];  Service: Cardiovascular;  Laterality: Right;   PACEMAKER GENERATOR CHANGE  REPLACEMENT TOTAL KNEE BILATERAL     RTC     STENT PLACE LEFT URETER ([place] HX) Right    Right Leg   TEE WITHOUT CARDIOVERSION N/A 03/26/2021   Procedure: TRANSESOPHAGEAL ECHOCARDIOGRAM (TEE);  Surgeon: Kate Sable, MD;  Location: [facility] ORS;  Service: Cardiovascular;  Laterality: N/A;   Toe nail removed Bilateral     Prior to Admission medications   Medication Sig Start Date End Date Taking? Authorizing Provider  acetaminophen (TYLENOL) 325 MG tablet Take 2 tablets (650 mg total) by mouth every 6 (six) hours as needed for fever, headache or moderate pain. 05/09/21   Loletha Grayer, MD  albuterol (VENTOLIN HFA) 108 (90 Base) MCG/ACT inhaler Inhale 2 puffs into the lungs every 4 (four) hours as needed for wheezing or shortness of breath. 05/09/21   Loletha Grayer,  MD  allopurinol (ZYLOPRIM) 100 MG tablet Take 100 mg by mouth daily. 10/19/20   [provider]  amLODipine (NORVASC) 5 MG tablet Take 1 tablet (5 mg total) by mouth daily. 03/28/21   Loletha Grayer, MD  baclofen (LIORESAL) 10 MG tablet Take 10 mg by mouth 2 (two) times daily. 02/12/21   [provider]  BAYER ASPIRIN EC LOW DOSE 81 MG EC tablet Take 81 mg by mouth daily. 11/11/20   [provider]  carvedilol (COREG) 6.25 MG tablet Take 6.25 mg by mouth at bedtime. 03/11/21   [provider]  cetirizine (ZYRTEC) 10 MG tablet Take 10 mg by mouth daily. 11/11/20   [provider]  clopidogrel (PLAVIX) 75 MG tablet Take 1 tablet (75 mg total) by mouth daily. 02/22/21   Charlynne Cousins, MD  D-5000 125 MCG (5000 UT) TABS Take 1 tablet by mouth daily. 11/11/20   [provider]  furosemide (LASIX) 20 MG tablet Take 20 mg by mouth daily. 10/19/20   [provider]  gabapentin (NEURONTIN) 100 MG capsule Take 100 mg by mouth daily. 01/29/21   [provider]  insulin glargine (LANTUS) 100 UNIT/ML Solostar Pen Inject 18 Units into the skin at bedtime. 05/09/21   Loletha Grayer, MD  isosorbide mononitrate (IMDUR) 30 MG 24 hr tablet Take 30 mg by mouth daily. 02/12/21   [provider]  loperamide (IMODIUM) 2 MG capsule Take 1 capsule (2 mg total) by mouth as needed for diarrhea or loose stools. 03/31/21   Blake Divine, MD  multivitamin (ONE-A-DAY MEN'S) TABS tablet Take 1 tablet by mouth daily. 11/11/20   [provider]  pantoprazole (PROTONIX) 40 MG tablet Take 1 tablet (40 mg total) by mouth daily. 02/11/21   [name], MD  rosuvastatin (CRESTOR) 40 MG tablet Take 40 mg by mouth daily. 02/13/21   [provider]  sertraline (ZOLOFT) 100 MG tablet Take 200 mg by mouth daily. 11/11/20   [provider]  tamsulosin (FLOMAX) 0.4 MG CAPS capsule Take 0.4 mg by mouth daily. 11/11/20   [provider]    Allergies Atorvastatin, Celecoxib, Dicyclomine hcl, Doxycycline, and Metformin  Family History  Problem Relation Age of Onset   Heart disease Mother    Alzheimer's disease Mother    Heart disease Father    Diabetes Father     Social History Social History   Tobacco Use   Smoking status: Former    Pack years: 0.00   Smokeless tobacco: Never  Substance Use Topics   Alcohol use: Not Currently   Drug use: Not Currently    Review of Systems Constitutional: No fever/chills  Eyes: No visual changes. ENT: No sore throat. Cardiovascular: Positive for palpitations denies chest pain. Respiratory: Denies shortness of breath. Gastrointestinal: No abdominal pain.  No nausea, no vomiting.  No diarrhea.  No constipation. Genitourinary: Negative for dysuria. Musculoskeletal: Negative for neck pain.  Negative for back pain. Integumentary: Negative for rash. Neurological: Negative for headaches, focal weakness or numbness.   ____________________________________________   PHYSICAL EXAM:  VITAL SIGNS: ED Triage Vitals  Enc Vitals Group     BP 05/23/21 2120 (!) 172/86     Pulse Rate 05/23/21 2120 65     Resp 05/23/21 2120 18     Temp 05/23/21 2120 98.3 F (36.8 C)     Temp Source 05/23/21 2120 Oral     SpO2 05/23/21 2120 98 %     Weight 05/23/21 2119 104.3 kg (230 lb)     Height 05/23/21 2119 1.727 m ('5\' 8"'$ )     Head Circumference --      Peak Flow --      Pain Score 05/23/21 2135 0     Pain Loc --      Pain Edu? --      Excl. in [place]? --     Constitutional: Alert and oriented.  Eyes: Conjunctivae are normal.  Head: Atraumatic. Nose: No congestion/rhinnorhea. Mouth/Throat: Patient is wearing a mask. Neck: No stridor.  No meningeal signs.   Cardiovascular: Normal rate, regular rhythm. Good peripheral circulation. Respiratory: Normal respiratory effort.  No retractions. Gastrointestinal: Soft and nontender. No distention.  Musculoskeletal: No lower extremity  tenderness nor edema. No gross deformities of extremities. Neurologic:  Normal speech and language. No gross focal neurologic deficits are appreciated.  Skin:  Skin is warm, dry and intact. Psychiatric: Mood and affect are normal. Speech and behavior are normal.  ____________________________________________   LABS (all labs ordered are listed, but only abnormal results are displayed)  Labs Reviewed  BASIC METABOLIC PANEL - Abnormal; Notable for the following components:      Result Value   Glucose, Bld 275 (*)    BUN 52 (*)    Creatinine, Ser 1.34 (*)    Calcium 8.5 (*)    GFR, Estimated 54 (*)    All other components within normal limits  CBC - Abnormal; Notable for the following components:   RBC 3.53 (*)    Hemoglobin 10.6 (*)    HCT 32.0 (*)    Platelets 127 (*)    All other components within normal limits  TROPONIN I (HIGH SENSITIVITY)  TROPONIN I (HIGH SENSITIVITY)   ____________________________________________  EKG  ED ECG REPORT I, Hinda Kehr, the attending physician, personally viewed and interpreted this ECG.  Date: 05/23/2021 EKG Time: 21: 26 Rate: 63 Rhythm: Junctional rhythm QRS Axis: normal Intervals: Right bundle branch block ST/T Wave abnormalities: Non-specific ST segment / T-wave changes, but no clear evidence of acute ischemia. Narrative Interpretation: no definitive evidence of acute ischemia; does not meet STEMI criteria.  ____________________________________________  RADIOLOGY I, Hinda Kehr, personally viewed and evaluated these images (plain radiographs) as part of my medical decision making, as well as reviewing the written report by the radiologist.  ED MD interpretation: No acute abnormality  Official radiology report(s): DG Chest 2 View  Result Date: 05/23/2021 CLINICAL DATA:  Palpitations and shortness of breath. EXAM: CHEST - 2 VIEW COMPARISON:  Chest x-ray dated May 06, 2021. FINDINGS: Unchanged left chest wall pacemaker. The  heart size and mediastinal contours are within normal limits. Prior TAVR normal pulmonary vascularity. Minimal  atelectasis at the right lung base. No focal consolidation, pleural effusion, or pneumothorax. No acute osseous abnormality. IMPRESSION: No active cardiopulmonary disease. Electronically Signed   By: Titus Dubin M.D.   On: 05/23/2021 21:53    ____________________________________________   PROCEDURES   Procedure(s) performed (including Critical Care):  Procedures   ____________________________________________   INITIAL IMPRESSION / MDM / [place] / ED COURSE  As part of my medical decision making, I reviewed the following data within the [place] History obtained from family, Nursing notes reviewed and incorporated, Labs reviewed , EKG interpreted , Old chart reviewed, Notes from prior ED visits, and [place] Controlled Substance Database   Differential diagnosis includes, but is not limited to, A. fib, SVT, other nonspecific cardiac arrhythmia, ACS, PE.  Patient has been asymptomatic for about 10 hours.  EKG is nonischemic and the patient is not in A. fib.    Vital signs are stable other than hypertension.  His basic metabolic panel, 2 high-sensitivity troponins, and CBC are all stable.  I personally reviewed the patient's imaging and agree with the radiologist's interpretation that there is no evidence of acute abnormality on chest x-ray.        ____________________________________________  FINAL CLINICAL IMPRESSION(S) / ED DIAGNOSES  Final diagnoses:  Palpitations     MEDICATIONS GIVEN DURING THIS VISIT:  Medications - No data to display   ED Discharge Orders     None        Note:  This document was prepared using Dragon voice recognition software and may include unintentional dictation errors.   Hinda Kehr, MD 05/24/21 [phone]

## 2021-05-24 NOTE — Discharge Instructions (Addendum)
Your workup in the Emergency Department today was reassuring.  We did not find any specific abnormalities.  We recommend you drink plenty of fluids, take your regular medications and/or any new ones prescribed today, and follow up with the doctor(s) listed in these documents as recommended.  Return to the Emergency Department if you develop new or worsening symptoms that concern you.  

## 2021-05-25 ENCOUNTER — Emergency Department: Payer: 59

## 2021-05-25 ENCOUNTER — Other Ambulatory Visit: Payer: Self-pay

## 2021-05-25 ENCOUNTER — Observation Stay
Admission: EM | Admit: 2021-05-25 | Discharge: 2021-05-27 | Disposition: A | Discharge status: Home / Self Care | Payer: 59 | Attending: Internal Medicine | Admitting: Internal Medicine

## 2021-05-25 DIAGNOSIS — I129 Hypertensive chronic kidney disease with stage 1 through stage 4 chronic kidney disease, or unspecified chronic kidney disease: Secondary | ICD-10-CM | POA: Diagnosis not present

## 2021-05-25 DIAGNOSIS — R55 Syncope and collapse: Principal | ICD-10-CM | POA: Insufficient documentation

## 2021-05-25 DIAGNOSIS — Z87891 Personal history of nicotine dependence: Secondary | ICD-10-CM | POA: Insufficient documentation

## 2021-05-25 DIAGNOSIS — Z20822 Contact with and (suspected) exposure to covid-19: Secondary | ICD-10-CM | POA: Diagnosis not present

## 2021-05-25 DIAGNOSIS — I25709 Atherosclerosis of coronary artery bypass graft(s), unspecified, with unspecified angina pectoris: Secondary | ICD-10-CM | POA: Diagnosis present

## 2021-05-25 DIAGNOSIS — Z950 Presence of cardiac pacemaker: Secondary | ICD-10-CM | POA: Diagnosis not present

## 2021-05-25 DIAGNOSIS — F32A Depression, unspecified: Secondary | ICD-10-CM | POA: Diagnosis present

## 2021-05-25 DIAGNOSIS — Z794 Long term (current) use of insulin: Secondary | ICD-10-CM | POA: Insufficient documentation

## 2021-05-25 DIAGNOSIS — E1169 Type 2 diabetes mellitus with other specified complication: Secondary | ICD-10-CM | POA: Diagnosis present

## 2021-05-25 DIAGNOSIS — I2510 Atherosclerotic heart disease of native coronary artery without angina pectoris: Secondary | ICD-10-CM | POA: Diagnosis not present

## 2021-05-25 DIAGNOSIS — Z79899 Other long term (current) drug therapy: Secondary | ICD-10-CM | POA: Diagnosis not present

## 2021-05-25 DIAGNOSIS — Z953 Presence of xenogenic heart valve: Secondary | ICD-10-CM

## 2021-05-25 DIAGNOSIS — F1120 Opioid dependence, uncomplicated: Secondary | ICD-10-CM | POA: Diagnosis present

## 2021-05-25 DIAGNOSIS — N1831 Chronic kidney disease, stage 3a: Secondary | ICD-10-CM | POA: Insufficient documentation

## 2021-05-25 DIAGNOSIS — J449 Chronic obstructive pulmonary disease, unspecified: Secondary | ICD-10-CM | POA: Diagnosis present

## 2021-05-25 DIAGNOSIS — Z952 Presence of prosthetic heart valve: Secondary | ICD-10-CM

## 2021-05-25 DIAGNOSIS — E1122 Type 2 diabetes mellitus with diabetic chronic kidney disease: Secondary | ICD-10-CM | POA: Insufficient documentation

## 2021-05-25 DIAGNOSIS — I739 Peripheral vascular disease, unspecified: Secondary | ICD-10-CM | POA: Diagnosis present

## 2021-05-25 DIAGNOSIS — Z96653 Presence of artificial knee joint, bilateral: Secondary | ICD-10-CM | POA: Insufficient documentation

## 2021-05-25 DIAGNOSIS — I10 Essential (primary) hypertension: Secondary | ICD-10-CM | POA: Diagnosis present

## 2021-05-25 HISTORY — DX: Unspecified hearing loss, unspecified ear: H91.90

## 2021-05-25 LAB — CBG MONITORING, ED
Glucose-Capillary: 83 mg/dL (ref 70–99)
Glucose-Capillary: 79 mg/dL (ref 70–99)
Glucose-Capillary: 193 mg/dL — ABNORMAL HIGH (ref 70–99)

## 2021-05-25 LAB — CBC WITH DIFFERENTIAL/PLATELET
Abs Immature Granulocytes: 0.01 10*3/uL (ref 0.00–0.07)
Basophils Absolute: 0.1 10*3/uL (ref 0.0–0.1)
Basophils Relative: 1 %
Eosinophils Absolute: 0.2 10*3/uL (ref 0.0–0.5)
Eosinophils Relative: 5 %
HCT: 31.8 % — ABNORMAL LOW (ref 39.0–52.0)
Hemoglobin: 10.6 g/dL — ABNORMAL LOW (ref 13.0–17.0)
Immature Granulocytes: 0 %
Lymphocytes Relative: 27 %
Lymphs Abs: 1.3 10*3/uL (ref 0.7–4.0)
MCH: 30.2 pg (ref 26.0–34.0)
MCHC: 33.3 g/dL (ref 30.0–36.0)
MCV: 90.6 fL (ref 80.0–100.0)
Monocytes Absolute: 0.3 10*3/uL (ref 0.1–1.0)
Monocytes Relative: 7 %
Neutro Abs: 2.9 10*3/uL (ref 1.7–7.7)
Neutrophils Relative %: 60 %
Platelets: 135 10*3/uL — ABNORMAL LOW (ref 150–400)
RBC: 3.51 MIL/uL — ABNORMAL LOW (ref 4.22–5.81)
RDW: 14.6 % (ref 11.5–15.5)
WBC: 4.8 10*3/uL (ref 4.0–10.5)
nRBC: 0 % (ref 0.0–0.2)

## 2021-05-25 LAB — BASIC METABOLIC PANEL
Anion gap: 8 (ref 5–15)
BUN: 45 mg/dL — ABNORMAL HIGH (ref 8–23)
CO2: 25 mmol/L (ref 22–32)
Calcium: 8.5 mg/dL — ABNORMAL LOW (ref 8.9–10.3)
Chloride: 107 mmol/L (ref 98–111)
Creatinine, Ser: 1.41 mg/dL — ABNORMAL HIGH (ref 0.61–1.24)
GFR, Estimated: 51 mL/min — ABNORMAL LOW (ref >60)
Glucose, Bld: 97 mg/dL (ref 70–99)
Potassium: 3.8 mmol/L (ref 3.5–5.1)
Sodium: 140 mmol/L (ref 135–145)

## 2021-05-25 LAB — GLUCOSE, CAPILLARY
Glucose-Capillary: 106 mg/dL — ABNORMAL HIGH (ref 70–99)
Glucose-Capillary: 202 mg/dL — ABNORMAL HIGH (ref 70–99)

## 2021-05-25 LAB — TROPONIN I (HIGH SENSITIVITY)
Troponin I (High Sensitivity): 15 ng/L (ref <18)
Troponin I (High Sensitivity): 14 ng/L (ref <18)

## 2021-05-25 LAB — RESP PANEL BY RT-PCR (FLU A&B, COVID) ARPGX2
Influenza A by PCR: NEGATIVE
Influenza B by PCR: NEGATIVE
SARS Coronavirus 2 by RT PCR: NEGATIVE

## 2021-05-25 LAB — MAGNESIUM: Magnesium: 1.9 mg/dL (ref 1.7–2.4)

## 2021-05-25 MED ORDER — ENOXAPARIN SODIUM 60 MG/0.6ML IJ SOSY
0.5000 mg/kg | PREFILLED_SYRINGE | INTRAMUSCULAR | Status: DC
  Administered 2021-05-25 – 2021-05-26 (×2): 52.5 mg via SUBCUTANEOUS
  Filled 2021-05-25 (×3): qty 0.53

## 2021-05-25 MED ORDER — HYDROCODONE-ACETAMINOPHEN 5-325 MG PO TABS
1.0000 | ORAL_TABLET | ORAL | Status: DC | PRN
  Administered 2021-05-25 – 2021-05-26 (×2): 1 via ORAL
  Administered 2021-05-27: 2 via ORAL
  Filled 2021-05-25: qty 2
  Filled 2021-05-25 (×2): qty 1
  Filled 2021-05-25: qty 2

## 2021-05-25 MED ORDER — SODIUM CHLORIDE 0.45 % IV SOLN: INTRAVENOUS | Status: DC

## 2021-05-25 MED ORDER — ACETAMINOPHEN 650 MG RE SUPP
650.0000 mg | Freq: Four times a day (QID) | RECTAL | Status: DC | PRN
Start: 2021-05-25 — End: 2021-05-27

## 2021-05-25 MED ORDER — CALCIUM CARBONATE ANTACID 500 MG PO CHEW
1.0000 | CHEWABLE_TABLET | Freq: Four times a day (QID) | ORAL | Status: DC | PRN
  Administered 2021-05-25 – 2021-05-26 (×5): 200 mg via ORAL
  Filled 2021-05-25 (×6): qty 1

## 2021-05-25 MED ORDER — SODIUM CHLORIDE 0.9 % IV BOLUS
500.0000 mL | Freq: Once | INTRAVENOUS | Status: AC
  Administered 2021-05-25: 500 mL via INTRAVENOUS

## 2021-05-25 MED ORDER — INSULIN ASPART 100 UNIT/ML IJ SOLN
0.0000 [IU] | Freq: Every day | INTRAMUSCULAR | Status: DC
Start: 2021-05-25 — End: 2021-05-27
  Administered 2021-05-25: 2 [IU] via SUBCUTANEOUS
  Filled 2021-05-25: qty 1

## 2021-05-25 MED ORDER — ONDANSETRON HCL 4 MG PO TABS: 4.0000 mg | ORAL_TABLET | Freq: Four times a day (QID) | ORAL | Status: DC | PRN

## 2021-05-25 MED ORDER — ONDANSETRON HCL 4 MG/2ML IJ SOLN: 4.0000 mg | Freq: Four times a day (QID) | INTRAMUSCULAR | Status: DC | PRN

## 2021-05-25 MED ORDER — ACETAMINOPHEN 325 MG PO TABS
650.0000 mg | ORAL_TABLET | Freq: Four times a day (QID) | ORAL | Status: DC | PRN
  Filled 2021-05-25: qty 2

## 2021-05-25 MED ORDER — SODIUM CHLORIDE 0.9% FLUSH
3.0000 mL | Freq: Two times a day (BID) | INTRAVENOUS | Status: DC
  Administered 2021-05-25 – 2021-05-27 (×5): 3 mL via INTRAVENOUS

## 2021-05-25 MED ORDER — INSULIN ASPART 100 UNIT/ML IJ SOLN
0.0000 [IU] | Freq: Three times a day (TID) | INTRAMUSCULAR | Status: DC
Start: 2021-05-25 — End: 2021-05-27
  Administered 2021-05-25: 3 [IU] via SUBCUTANEOUS
  Administered 2021-05-26 (×2): 2 [IU] via SUBCUTANEOUS
  Administered 2021-05-26: 5 [IU] via SUBCUTANEOUS
  Administered 2021-05-27: 3 [IU] via SUBCUTANEOUS
  Filled 2021-05-25 (×5): qty 1

## 2021-05-25 MED ORDER — ENOXAPARIN SODIUM 40 MG/0.4ML IJ SOSY: 40.0000 mg | PREFILLED_SYRINGE | INTRAMUSCULAR | Status: DC

## 2021-05-25 MED ORDER — HYDRALAZINE HCL 20 MG/ML IJ SOLN
10.0000 mg | Freq: Four times a day (QID) | INTRAMUSCULAR | Status: DC | PRN
  Administered 2021-05-25 – 2021-05-26 (×3): 10 mg via INTRAVENOUS
  Filled 2021-05-25 (×4): qty 1

## 2021-05-25 NOTE — ED Triage Notes (Addendum)
Pt to ED via [facility]. Pt reports feeling dizzy and then falling. Pt also reports hearing a "buzzing" in his ears. Pt unsure if he hit his head. Pt alert and oriented x4. NAD noted. Denies pain. Pacemaker present. CBG 99 with EMS.

## 2021-05-25 NOTE — ED Notes (Signed)
Pt eating breakfast at this time.  

## 2021-05-25 NOTE — Progress Notes (Signed)
Patient ID: [name], male   DOB: 09-25-1941, 80 y.o.   MRN: [id number] This is a no charge note as patient was admitted this AM.Pt seen and examined. Chart reviewed.   Nathan Singleton is a 80 y.o. 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  Patient was seen the day prior for palpitations and had an unremarkable work-up and was discharged home.  He was reportedly feeling okay but suddenly experienced a buzzing sensation in his head and then passed out.  He had no preceding chest pain, shortness of breath, headache visual disturbance or one-sided numbness weakness or tingling. Pt reported to me this am had diarrhea at home.   Cr 1.4  Hard of hearing Cta  Regular s1/s2  Soft benign +bs No edema  A/P: Will start gentle hydration Orthostatic positive by bp Hold bp meds, give iv prn

## 2021-05-25 NOTE — ED Notes (Signed)
Pt up to toilet with assistance.

## 2021-05-25 NOTE — Progress Notes (Signed)
PT Cancellation Note  Patient Details Name: Nathan Singleton MRN: [id number] DOB: 1941-10-19   Cancelled Treatment:    Reason Eval/Treat Not Completed: Medical issues which prohibited therapy;Other (comment) Pt was here 2-3 weeks ago and was able to ambulate ~200 ft (with post-op shoe?) and RW, apparently d/c'd to rehab (using post-op shoe in that setting) and recently discharged home.  Shoe at home, not in room today and is unsure if he is indeed supposed to be wearing it officially.  On inspection amputated toe appears to be healing nicely, adjacent 2nd digit however with dorsal wound.  Spoke with nursing about getting vascular and/or podiatry to weigh in on WBing/post-op shoe/etc before we attempt ambulation/WBing.    Kreg Shropshire, DPT 05/25/2021, 4:43 PM

## 2021-05-25 NOTE — ED Notes (Signed)
Report given to John RN.

## 2021-05-25 NOTE — Progress Notes (Signed)
PHARMACIST - PHYSICIAN COMMUNICATION  CONCERNING:  Enoxaparin (Lovenox) for DVT Prophylaxis    RECOMMENDATION: Patient was prescribed enoxaparin '40mg'$  q24 hours for VTE prophylaxis.   Filed Weights   05/25/21 0120  Weight: 104.3 kg (229 lb 15 oz)    Body mass index is 34.96 kg/m.  Estimated Creatinine Clearance: 50.6 mL/min (A) (by C-G formula based on SCr of 1.41 mg/dL (H)).   Based on [place] patient is candidate for enoxaparin 0.'5mg'$ /kg TBW SQ every 24 hours based on BMI being >30.   DESCRIPTION: Pharmacy has adjusted enoxaparin dose per [facility] policy.  Patient is now receiving enoxaparin 52.5 mg every 24 hours    Benita Gutter 05/25/2021 7:07 AM

## 2021-05-25 NOTE — H&P (Signed)
History and Physical    Nathan Singleton [id number] DOB: [birth year] DOA: 05/25/2021  PCP: Ranae Plumber, [place]   Patient coming from: Home  I have personally briefly reviewed patient's old medical records in [place]  Chief Complaint: Passed out  HPI: Nathan Singleton is a 80 y.o. 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  Patient was seen the day prior for palpitations and had an unremarkable work-up and was discharged home.  He was reportedly feeling okay but suddenly experienced a buzzing sensation in his head and then passed out.  He had no preceding chest pain, shortness of breath, headache visual disturbance or one-sided numbness weakness or tingling.  He has no cough, fever or chills and denies abdominal pain or diarrhea or nausea or vomiting.  Patient was hospitalized 2 weeks ago with a syncopal event in the setting of diarrhea that was thought vasovagal.,  He was also found to have wound infection from a recently amputated toe and was treated with antibiotics..  Patient presently denies any complaints regarding the foot. ED course: On arrival, vitals within normal limits.  Troponins negative at 15/14, blood work unremarkable except for mildly elevated creatinine of 1.41 above baseline of 1.13 and hemoglobin of 10.6 which is his baseline. EKG, Personally viewed and interpreted: Junctional rhythm at 60 Imaging: CT head with no acute intracranial abnormality  Patient given an IV fluid bolus hospitalist consulted for admission.    Review of Systems: As per HPI otherwise all other systems on review of systems negative.    Past Medical History:  Diagnosis Date   Aneurysm (arteriovenous) of coronary vessels    Arthritis    Ascending  aortic aneurysm (HCC)    CAD (coronary artery disease)    Diabetes mellitus without complication ([place])    Hard of hearing    History of GI bleed    Hyperlipidemia associated with type 2 diabetes mellitus ([place])    Hypertension associated with diabetes ([place])    Presence of permanent cardiac pacemaker    PVD (peripheral vascular disease) ([place])    S/P TAVR (transcatheter aortic valve replacement)     Past Surgical History:  Procedure Laterality Date   AMPUTATION TOE Right 03/20/2021   Procedure: AMPUTATION TOE;  Surgeon: Sharlotte Alamo, DPM;  Location: [facility] ORS;  Service: Podiatry;  Laterality: Right;   Ankle repaired Right    Carpal Tunnel repaired Left    CATARACT EXTRACTION Bilateral    COLONOSCOPY     Copillar implant      DG CHOLECYSTOGRAPHY GALL BLADDER ([facility] HX)     GASTRIC BYPASS     heart valve replaced     IRRIGATION AND DEBRIDEMENT FOOT Right 02/18/2021   Procedure: IRRIGATION AND DEBRIDEMENT FOOT-Right Great Toe;  Surgeon: [name], DPM;  Location: [facility] OR[facility];  Service: Podiatry;  Laterality: Right;   LOWER EXTREMITY ANGIOGRAPHY Right 02/17/2021   Procedure: Lower Extremity Angiography;  Surgeon: Algernon Huxley, MD;  Location: [place] [facility];  Service: Cardiovascular;  Laterality: Right;   LOWER EXTREMITY ANGIOGRAPHY Right 03/27/2021   Procedure: Lower Extremity Angiography;  Surgeon: Algernon Huxley, MD;  Location: [place] [facility];  Service: Cardiovascular;  Laterality: Right;   PACEMAKER GENERATOR CHANGE     REPLACEMENT TOTAL KNEE BILATERAL     RTC  STENT PLACE LEFT URETER ([facility] HX) Right    Right Leg   TEE WITHOUT CARDIOVERSION N/A 03/26/2021   Procedure: TRANSESOPHAGEAL ECHOCARDIOGRAM (TEE);  Surgeon: Kate Sable, MD;  Location: [facility];  Service: Cardiovascular;  Laterality: N/A;   Toe nail removed Bilateral      reports that he has quit smoking. He has never used smokeless tobacco. He reports previous alcohol use. He reports previous drug  use.  Allergies  Allergen Reactions   Atorvastatin Other (See Comments)    "bad for kidneys" Cramping    Celecoxib Other (See Comments)    Other reaction(s): Other (See Comments) Kidney Problem Kidney Problem    Dicyclomine Hcl Other (See Comments)    Stomach cramps   Doxycycline Other (See Comments)    Per wife (per RN)- pt gets severe stomach pains even when takes with food and PCP said to not take     Metformin Other (See Comments)    Family History  Problem Relation Age of Onset   Heart disease Mother    Alzheimer's disease Mother    Heart disease Father    Diabetes Father       Prior to Admission medications   Medication Sig Start Date End Date Taking? Authorizing Provider  acetaminophen (TYLENOL) 325 MG tablet Take 2 tablets (650 mg total) by mouth every 6 (six) hours as needed for fever, headache or moderate pain. 05/09/21   Loletha Grayer, MD  albuterol (VENTOLIN HFA) 108 (90 Base) MCG/ACT inhaler Inhale 2 puffs into the lungs every 4 (four) hours as needed for wheezing or shortness of breath. 05/09/21   Loletha Grayer, MD  allopurinol (ZYLOPRIM) 100 MG tablet Take 100 mg by mouth daily. 10/19/20   [provider]  amLODipine (NORVASC) 5 MG tablet Take 1 tablet (5 mg total) by mouth daily. 03/28/21   Loletha Grayer, MD  baclofen (LIORESAL) 10 MG tablet Take 10 mg by mouth 2 (two) times daily. 02/12/21   [provider]  BAYER ASPIRIN EC LOW DOSE 81 MG EC tablet Take 81 mg by mouth daily. 11/11/20   [provider]  carvedilol (COREG) 6.25 MG tablet Take 6.25 mg by mouth at bedtime. 03/11/21   [provider]  cetirizine (ZYRTEC) 10 MG tablet Take 10 mg by mouth daily. 11/11/20   [provider]  clopidogrel (PLAVIX) 75 MG tablet Take 1 tablet (75 mg total) by mouth daily. 02/22/21   Charlynne Cousins, MD  D-5000 125 MCG (5000 UT) TABS Take 1 tablet by mouth daily. 11/11/20   [provider]  furosemide (LASIX)  20 MG tablet Take 20 mg by mouth daily. 10/19/20   [provider]  gabapentin (NEURONTIN) 100 MG capsule Take 100 mg by mouth daily. 01/29/21   [provider]  insulin glargine (LANTUS) 100 UNIT/ML Solostar Pen Inject 18 Units into the skin at bedtime. 05/09/21   Loletha Grayer, MD  isosorbide mononitrate (IMDUR) 30 MG 24 hr tablet Take 30 mg by mouth daily. 02/12/21   [provider]  loperamide (IMODIUM) 2 MG capsule Take 1 capsule (2 mg total) by mouth as needed for diarrhea or loose stools. 03/31/21   Blake Divine, MD  multivitamin (ONE-A-DAY MEN'S) TABS tablet Take 1 tablet by mouth daily. 11/11/20   [provider]  pantoprazole (PROTONIX) 40 MG tablet Take 1 tablet (40 mg total) by mouth daily. 02/11/21   [name], MD  rosuvastatin (CRESTOR) 40 MG tablet Take 40 mg by mouth daily. 02/13/21  [provider]  sertraline (ZOLOFT) 100 MG tablet Take 200 mg by mouth daily. 11/11/20   [provider]  tamsulosin (FLOMAX) 0.4 MG CAPS capsule Take 0.4 mg by mouth daily. 11/11/20   [provider]    Physical Exam: Vitals:   05/25/21 0120 05/25/21 0123 05/25/21 0230 05/25/21 0300  BP:  132/67 (!) 163/92 116/66  Pulse:  60 62 60  Resp:  '14 17 14  '$ Temp:  98.2 F (36.8 C)    TempSrc:  Oral    SpO2:  100% 98% 95%  Weight: 104.3 kg     Height: '5\' 8"'$  (1.727 m)        Vitals:   05/25/21 0120 05/25/21 0123 05/25/21 0230 05/25/21 0300  BP:  132/67 (!) 163/92 116/66  Pulse:  60 62 60  Resp:  '14 17 14  '$ Temp:  98.2 F (36.8 C)    TempSrc:  Oral    SpO2:  100% 98% 95%  Weight: 104.3 kg     Height: '5\' 8"'$  (1.727 m)         Constitutional: Alert and oriented x 3 . Not in any apparent distress HEENT:      Head: Normocephalic and atraumatic.         Eyes: PERLA, EOMI, Conjunctivae are normal. Sclera is non-icteric.       Mouth/Throat: Mucous membranes are moist.       Neck: Supple with no signs of meningismus. Cardiovascular:  Regular rate and rhythm. No murmurs, gallops, or rubs. 2+ symmetrical distal pulses are present . No JVD. No LE edema Respiratory: Respiratory effort normal .Lungs sounds clear bilaterally. No wheezes, crackles, or rhonchi.  Gastrointestinal: Soft, non tender, and non distended with positive bowel sounds.  Genitourinary: No CVA tenderness. Musculoskeletal: Nontender with normal range of motion in all extremities. No cyanosis, or erythema of extremities. Neurologic:  Face is symmetric. Moving all extremities. No gross focal neurologic deficits . Skin: Skin is warm, dry.  No rash or ulcers Psychiatric: Mood and affect are normal    Labs on Admission: I have personally reviewed following labs and imaging studies  CBC: Recent Labs  Lab 05/23/21 2121 05/25/21 0122  WBC 5.7 4.8  NEUTROABS  --  2.9  HGB 10.6* 10.6*  HCT 32.0* 31.8*  MCV 90.7 90.6  PLT 127* [id number]*   Basic Metabolic Panel: Recent Labs  Lab 05/23/21 2121 05/25/21 0122  NA 138 140  K 4.4 3.8  CL 107 107  CO2 22 25  GLUCOSE 275* 97  BUN 52* 45*  CREATININE 1.34* 1.41*  CALCIUM 8.5* 8.5*  MG  --  1.9   GFR: Estimated Creatinine Clearance: 50.6 mL/min (A) (by C-G formula based on SCr of 1.41 mg/dL (H)). Liver Function Tests: No results for input(s): AST, ALT, ALKPHOS, BILITOT, PROT, ALBUMIN in the last 168 hours. No results for input(s): LIPASE, AMYLASE in the last 168 hours. No results for input(s): AMMONIA in the last 168 hours. Coagulation Profile: No results for input(s): INR, PROTIME in the last 168 hours. Cardiac Enzymes: No results for input(s): CKTOTAL, CKMB, CKMBINDEX, TROPONINI in the last 168 hours. BNP (last 3 results) No results for input(s): PROBNP in the last 8760 hours. HbA1C: No results for input(s): HGBA1C in the last 72 hours. CBG: No results for input(s): GLUCAP in the last 168 hours. Lipid Profile: No results for input(s): CHOL, HDL, LDLCALC, TRIG, CHOLHDL, LDLDIRECT in the last 72  hours. Thyroid Function Tests: No results for input(s): TSH,  T4TOTAL, FREET4, T3FREE, THYROIDAB in the last 72 hours. Anemia Panel: No results for input(s): VITAMINB12, FOLATE, FERRITIN, TIBC, IRON, RETICCTPCT in the last 72 hours. Urine analysis:    Component Value Date/Time   COLORURINE STRAW (A) 05/07/2021 0141   APPEARANCEUR CLEAR (A) 05/07/2021 0141   LABSPEC 1.009 05/07/2021 0141   PHURINE 5.0 05/07/2021 0141   GLUCOSEU >=500 (A) 05/07/2021 0141   HGBUR NEGATIVE 05/07/2021 0141   BILIRUBINUR NEGATIVE 05/07/2021 0141   KETONESUR NEGATIVE 05/07/2021 0141   PROTEINUR 30 (A) 05/07/2021 0141   NITRITE NEGATIVE 05/07/2021 0141   LEUKOCYTESUR NEGATIVE 05/07/2021 0141    Radiological Exams on Admission: DG Chest 2 View  Result Date: 05/23/2021 CLINICAL DATA:  Palpitations and shortness of breath. EXAM: CHEST - 2 VIEW COMPARISON:  Chest x-ray dated May 06, 2021. FINDINGS: Unchanged left chest wall pacemaker. The heart size and mediastinal contours are within normal limits. Prior TAVR normal pulmonary vascularity. Minimal atelectasis at the right lung base. No focal consolidation, pleural effusion, or pneumothorax. No acute osseous abnormality. IMPRESSION: No active cardiopulmonary disease. Electronically Signed   By: Titus Dubin M.D.   On: 05/23/2021 21:53   CT Head Wo Contrast  Result Date: 05/25/2021 CLINICAL DATA:  Syncope EXAM: CT HEAD WITHOUT CONTRAST TECHNIQUE: Contiguous axial images were obtained from the base of the skull through the vertex without intravenous contrast. COMPARISON:  05/06/2021 FINDINGS: Brain: No acute intracranial abnormality. Specifically, no hemorrhage, hydrocephalus, mass lesion, acute infarction, or significant intracranial injury. Vascular: No hyperdense vessel or unexpected calcification. Skull: No acute calvarial abnormality. Postop changes in the left mastoid. 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 Sinuses/Orbits: No acute findings Other: None IMPRESSION: No  acute intracranial abnormality. Electronically Signed   By: Rolm Baptise M.D.   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   Recurrent syncope and collapse Mild dehydration +?medication related -Patient with multiple cardiac/cardiovascular problems with palpitations and recurrent syncope most recently 2 weeks prior - Work-up essentially unrevealing - CT head unremarkable - Suspecting mild dehydration from recent diarrhea in combination with BP lowering meds (Coreg, amlodipine, carvedilol, furosemide, Imdur) as well as chronic pain meds: (Baclofen, gabapentin - Orthostatics daily -Arrange for pacemaker interrogation - Completed IV fluid bolus - PT and TOC consults    Hypertension - Hold home antihypertensives  Chronic cardiovascular conditions S/pTAVR/PVD s/p stents/CAD s/p CABG/AAA ascending aorta 4 cm 11/2021/pacemaker - Continue antiplatelets and statins - Holding blood pressure lowering medications for now: Coreg, amlodipine, carvedilol, furosemide, Imdur     COPD (chronic obstructive pulmonary disease) (HCC) - Stable without acute exacerbation - Continue home inhalers with duo nebs as needed  Chronic pain - Holding baclofen and gabapentin for now  Depression - Continue sertraline    Type 2 diabetes mellitus with hyperlipidemia (HCC) - Sliding scale insulin coverage    DVT prophylaxis: Lovenox  Code Status: full code  Family Communication:  wife at bedside  Disposition Plan: Back to previous home environment Consults called: none  Status:observation    Athena Masse MD [facility]     05/25/2021, 4:07 AM

## 2021-05-25 NOTE — ED Provider Notes (Signed)
[facility] Emergency Department Provider Note  ____________________________________________   Event Date/Time   First MD Initiated Contact with Patient 05/25/21 0118     (approximate)  I have reviewed the triage vital signs and the nursing notes.   HISTORY  Chief Complaint Dizziness and Fall  Level 5 caveat: Patient is very hard of hearing so history is somewhat limited.  HPI Karlos Malina is a 80 y.o. male with medical history as listed below who presents by EMS after passing out and having a fall at home.  He says that he felt fine and got up from bed to go to the bathroom.  He started to feel a "buzzing" in his head or his ears and then the next thing he knew he was on the floor.  He is not certain if he hit his head.  He said he feels fine now.  He is not certain what happened or why he passed out.  He had no chest pain.  He denies shortness of breath, nausea, vomiting, headache, neck pain.  The episode was acute in onset and severe nothing in particular made it better or worse.  He does not typically have episodes of passing out but he has an extremely extensive chronic medical history.     Past Medical History:  Diagnosis Date   Aneurysm (arteriovenous) of coronary vessels    Arthritis    Ascending aortic aneurysm (HCC)    CAD (coronary artery disease)    Diabetes mellitus without complication (HCC)    Hard of hearing    History of GI bleed    Hyperlipidemia associated with type 2 diabetes mellitus ([place])    Hypertension associated with diabetes ([place])    Presence of permanent cardiac pacemaker    PVD (peripheral vascular disease) ([place])    S/P TAVR (transcatheter aortic valve replacement)     Patient Active Problem List   Diagnosis Date Noted   Syncope and collapse 05/25/2021   Spondylosis without myelopathy or radiculopathy, lumbosacral region 05/12/2021   Orthostatic hypotension    Weakness    CAP (community acquired pneumonia)  05/06/2021   Syncope, vasovagal 05/06/2021   CKD (chronic kidney disease), stage IIIa 05/06/2021   Diarrhea 05/06/2021   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[place]) 05/06/2021   Osteoarthritis of facet joint at L5-S1 level of lumbosacral spine 04/22/2021   Lumbar facet joint syndrome (Bilateral) (L>R) 04/22/2021   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HCC)    MSSA bacteremia    Anemia of chronic disease    Cellulitis of second toe of right foot 03/20/2021   Type 2 diabetes mellitus with hyperlipidemia ([place]) 03/20/2021   CKD stage 3 due to type 2 diabetes mellitus ([place]) 03/20/2021   Dizziness 03/20/2021   Chronic pain syndrome 03/03/2021   Pharmacologic therapy 03/03/2021   Disorder of skeletal system 03/03/2021   Problems influencing health status [id number]   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[place]) 03/03/2021   Chronic lower extremity pain (2ry area of Pain) (Bilateral) (R>L) 03/03/2021   Chronic anticoagulation (Plavix) 03/03/2021   Chronic shoulder pain (3ry area of Pain) (Left) 03/03/2021   History of total knee replacement (Bilateral) 03/03/2021   DDD (degenerative disc disease), thoracic 03/03/2021   Cervical facet hypertrophy (Multilevel) (Bilateral) 03/03/2021   DDD (degenerative disc disease), cervical 03/03/2021   BPH (benign prostatic hyperplasia) 02/14/2021   Diabetic foot ulcer ([place]) 02/13/2021   Normocytic anemia 11/26/2020   Chronic kidney disease (CKD) 11/26/2020   Claudication ([place]) 11/26/2020   Contusion of right  thigh 11/26/2020   COPD (chronic obstructive pulmonary disease) ([place]) 11/26/2020   Depression 11/26/2020   Dyspnea 11/26/2020   Erectile dysfunction 11/26/2020   GERD without esophagitis 11/26/2020   Gout 11/26/2020   Hyperlipidemia, acquired 11/26/2020   Hyperlipidemia 11/26/2020   Myocardial infarction ([place]) 11/26/2020   Personal history of other diseases of urinary system 11/26/2020   Right leg swelling 11/26/2020   Sleep apnea 11/26/2020   Varicose veins 11/26/2020    Venous insufficiency 11/26/2020   Vitamin D deficiency 11/26/2020   Chest pain 11/21/2020   Thrombocytopenia ([place]) 11/21/2020   S/P TAVR (transcatheter aortic valve replacement)    PVD (peripheral vascular disease) ([place])    Presence of permanent cardiac pacemaker    Hypertension associated with diabetes ([place])    Hyperlipidemia associated with type 2 diabetes mellitus ([place])    History of GI bleed    CAD (coronary artery disease)    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 11/19/2020   Pacemaker St Jude device 11/19/2020   Peripheral vascular disease, unspecified ([place]) stents to popliteal arteries many years ago 11/19/2020   History of gingival bleeding 11/19/2020   Coronary artery disease involving coronary bypass graft of native heart with angina pectoris ([place]) 11/19/2020   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 11/19/2020   Hypertension    DM2 (diabetes mellitus, type 2) ([place])    Arthritis    Closed nondisplaced fracture of greater trochanter of right femur ([place]) 01/25/2020   DDD (degenerative disc disease), lumbar 12/10/2019   Chronic low back pain (1ry area of Pain) (Bilateral) (L>R) w/o sciatica 12/09/2019   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   Urinary retention 12/09/2019   Hearing loss 05/11/2019   Obesity 04/07/2019   Retinopathy 04/07/2019   Deafness, left 01/12/2019   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/12/2019   Bipolar disorder ([place]) 01/01/2019   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   Heart block 04/25/2018   Calculus of gallbladder with acute cholecystitis without obstruction 09/21/2017   Gastrojejunal ulcer 03/23/2014   Insomnia 03/22/2014   Blisters of multiple sites 01/18/2014   Cellulitis 01/18/2014   OA (osteoarthritis) of knee 06/09/2013   Status post percutaneous transluminal coronary angioplasty 01/14/2011    Past Surgical History:  Procedure Laterality Date   AMPUTATION TOE Right 03/20/2021    Procedure: AMPUTATION TOE;  Surgeon: Sharlotte Alamo, DPM;  Location: [facility] ORS;  Service: Podiatry;  Laterality: Right;   Ankle repaired Right    Carpal Tunnel repaired Left    CATARACT EXTRACTION Bilateral    COLONOSCOPY     Copillar implant      DG CHOLECYSTOGRAPHY GALL BLADDER ([facility] HX)     GASTRIC BYPASS     heart valve replaced     IRRIGATION AND DEBRIDEMENT FOOT Right 02/18/2021   Procedure: IRRIGATION AND DEBRIDEMENT FOOT-Right Great Toe;  Surgeon: [name], DPM;  Location: [facility] ORS;  Service: Podiatry;  Laterality: Right;   LOWER EXTREMITY ANGIOGRAPHY Right 02/17/2021   Procedure: Lower Extremity Angiography;  Surgeon: Algernon Huxley, MD;  Location: [place] [facility];  Service: Cardiovascular;  Laterality: Right;   LOWER EXTREMITY ANGIOGRAPHY Right 03/27/2021   Procedure: Lower Extremity Angiography;  Surgeon: Algernon Huxley, MD;  Location: [place] [facility];  Service: Cardiovascular;  Laterality: Right;   PACEMAKER GENERATOR CHANGE     REPLACEMENT TOTAL KNEE BILATERAL     RTC     STENT PLACE LEFT URETER ([place] HX) Right    Right  Leg   TEE WITHOUT CARDIOVERSION N/A 03/26/2021   Procedure: TRANSESOPHAGEAL ECHOCARDIOGRAM (TEE);  Surgeon: Kate Sable, MD;  Location: [facility] ORS;  Service: Cardiovascular;  Laterality: N/A;   Toe nail removed Bilateral     Prior to Admission medications   Medication Sig Start Date End Date Taking? Authorizing Provider  acetaminophen (TYLENOL) 325 MG tablet Take 2 tablets (650 mg total) by mouth every 6 (six) hours as needed for fever, headache or moderate pain. 05/09/21   Loletha Grayer, MD  albuterol (VENTOLIN HFA) 108 (90 Base) MCG/ACT inhaler Inhale 2 puffs into the lungs every 4 (four) hours as needed for wheezing or shortness of breath. 05/09/21   Loletha Grayer, MD  allopurinol (ZYLOPRIM) 100 MG tablet Take 100 mg by mouth daily. 10/19/20   [provider]  amLODipine (NORVASC) 5 MG tablet Take 1 tablet (5 mg total) by mouth daily.  03/28/21   Loletha Grayer, MD  baclofen (LIORESAL) 10 MG tablet Take 10 mg by mouth 2 (two) times daily. 02/12/21   [provider]  BAYER ASPIRIN EC LOW DOSE 81 MG EC tablet Take 81 mg by mouth daily. 11/11/20   [provider]  carvedilol (COREG) 6.25 MG tablet Take 6.25 mg by mouth at bedtime. 03/11/21   [provider]  cetirizine (ZYRTEC) 10 MG tablet Take 10 mg by mouth daily. 11/11/20   [provider]  clopidogrel (PLAVIX) 75 MG tablet Take 1 tablet (75 mg total) by mouth daily. 02/22/21   Charlynne Cousins, MD  D-5000 125 MCG (5000 UT) TABS Take 1 tablet by mouth daily. 11/11/20   [provider]  furosemide (LASIX) 20 MG tablet Take 20 mg by mouth daily. 10/19/20   [provider]  gabapentin (NEURONTIN) 100 MG capsule Take 100 mg by mouth daily. 01/29/21   [provider]  insulin glargine (LANTUS) 100 UNIT/ML Solostar Pen Inject 18 Units into the skin at bedtime. 05/09/21   Loletha Grayer, MD  isosorbide mononitrate (IMDUR) 30 MG 24 hr tablet Take 30 mg by mouth daily. 02/12/21   [provider]  loperamide (IMODIUM) 2 MG capsule Take 1 capsule (2 mg total) by mouth as needed for diarrhea or loose stools. 03/31/21   Blake Divine, MD  multivitamin (ONE-A-DAY MEN'S) TABS tablet Take 1 tablet by mouth daily. 11/11/20   [provider]  pantoprazole (PROTONIX) 40 MG tablet Take 1 tablet (40 mg total) by mouth daily. 02/11/21   [name], MD  rosuvastatin (CRESTOR) 40 MG tablet Take 40 mg by mouth daily. 02/13/21   [provider]  sertraline (ZOLOFT) 100 MG tablet Take 200 mg by mouth daily. 11/11/20   [provider]  tamsulosin (FLOMAX) 0.4 MG CAPS capsule Take 0.4 mg by mouth daily. 11/11/20   [provider]    Allergies Atorvastatin, Celecoxib, Dicyclomine hcl, Doxycycline, and Metformin  Family History  Problem Relation Age of Onset   Heart disease Mother    Alzheimer's  disease Mother    Heart disease Father    Diabetes Father     Social History Social History   Tobacco Use   Smoking status: Former    Pack years: 0.00   Smokeless tobacco: Never  Substance Use Topics   Alcohol use: Not Currently   Drug use: Not Currently    Review of Systems Level 5 caveat: Patient is very hard of hearing so history is somewhat limited.  Constitutional: No fever/chills Eyes: No visual changes. ENT: Positive for "buzzing" in  his ears prior to passing out.  No sore throat. Cardiovascular: Positive for syncope.  Denies chest pain. Respiratory: Denies shortness of breath. Gastrointestinal: No abdominal pain.  No nausea, no vomiting.  No diarrhea.  No constipation. Genitourinary: Negative for dysuria. Musculoskeletal: Negative for neck pain.  Negative for back pain. Integumentary: Negative for rash. Neurological: Negative for headaches, focal weakness or numbness.   ____________________________________________   PHYSICAL EXAM:  VITAL SIGNS: ED Triage Vitals  Enc Vitals Group     BP 05/25/21 0123 132/67     Pulse Rate 05/25/21 0123 60     Resp 05/25/21 0123 14     Temp 05/25/21 0123 98.2 F (36.8 C)     Temp Source 05/25/21 0123 Oral     SpO2 05/25/21 0123 100 %     Weight 05/25/21 0120 104.3 kg (229 lb 15 oz)     Height 05/25/21 0120 1.727 m ('5\' 8"'$ )     Head Circumference --      Peak Flow --      Pain Score 05/25/21 0120 0     Pain Loc --      Pain Edu? --      Excl. in [place]? --     Constitutional: Alert and oriented.  Eyes: Conjunctivae are normal.  Head: Atraumatic. Ears: No acute abnormalities identified in the patient's ear canals and the tympanic membranes appear normal. Nose: No congestion/rhinnorhea. Mouth/Throat: Patient is wearing a mask. Neck: No stridor.  No meningeal signs.   Cardiovascular: Pacemaker in the left chest.  Normal rate, regular rhythm. Good peripheral circulation. Respiratory: Normal respiratory effort.  No  retractions. Gastrointestinal: Soft and nontender. No distention.  Musculoskeletal: No lower extremity tenderness nor edema. No gross deformities of extremities. Neurologic:  Normal speech and language. No gross focal neurologic deficits are appreciated.  Skin:  Skin is warm, dry and intact.   ____________________________________________   LABS (all labs ordered are listed, but only abnormal results are displayed)  Labs Reviewed  CBC WITH DIFFERENTIAL/PLATELET - Abnormal; Notable for the following components:      Result Value   RBC 3.51 (*)    Hemoglobin 10.6 (*)    HCT 31.8 (*)    Platelets 135 (*)    All other components within normal limits  BASIC METABOLIC PANEL - Abnormal; Notable for the following components:   BUN 45 (*)    Creatinine, Ser 1.41 (*)    Calcium 8.5 (*)    GFR, Estimated 51 (*)    All other components within normal limits  RESP PANEL BY RT-PCR (FLU A&B, COVID) ARPGX2  MAGNESIUM  TROPONIN I (HIGH SENSITIVITY)  TROPONIN I (HIGH SENSITIVITY)   ____________________________________________  EKG  ED ECG REPORT I, Hinda Kehr, the attending physician, personally viewed and interpreted this ECG.  Date: 05/25/2021 EKG Time: 1:23 AM Rate: 60 Rhythm: Junctional rhythm QRS Axis: normal Intervals: normal ST/T Wave abnormalities: Non-specific ST segment / T-wave changes, but no clear evidence of acute ischemia. Narrative Interpretation: no definitive evidence of acute ischemia; does not meet STEMI criteria.  ____________________________________________  RADIOLOGY I, Hinda Kehr, personally viewed and evaluated these images (plain radiographs) as part of my medical decision making, as well as reviewing the written report by the radiologist.  ED MD interpretation: No acute abnormality identified on CT head  Official radiology report(s): CT Head Wo Contrast  Result Date: 05/25/2021 CLINICAL DATA:  Syncope EXAM: CT HEAD WITHOUT CONTRAST TECHNIQUE:  Contiguous axial images were obtained from the base of the  skull through the vertex without intravenous contrast. COMPARISON:  05/06/2021 FINDINGS: Brain: No acute intracranial abnormality. Specifically, no hemorrhage, hydrocephalus, mass lesion, acute infarction, or significant intracranial injury. Vascular: No hyperdense vessel or unexpected calcification. Skull: No acute calvarial abnormality. Postop changes in the left mastoid. 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 Sinuses/Orbits: No acute findings Other: None IMPRESSION: No acute intracranial abnormality. Electronically Signed   By: Rolm Baptise M.D.   On: 05/25/2021 02:17    ____________________________________________   PROCEDURES   Procedure(s) performed (including Critical Care):  Procedures   ____________________________________________   INITIAL IMPRESSION / MDM / [place] / ED COURSE  As part of my medical decision making, I reviewed the following data within the [place] History obtained from family, Nursing notes reviewed and incorporated, Labs reviewed , EKG interpreted , Old EKG reviewed, Old chart reviewed, Discussed with admitting physician , and Notes from prior ED visits   Differential diagnosis includes, but is not limited to, nonspecific cardiogenic syncope, arrhythmia, infection, shock, vasovagal episode, CVA, PE.  The patient is on the cardiac monitor to evaluate for evidence of arrhythmia and/or significant heart rate changes.  Patient is not having chest pain or shortness of breath.  I saw the patient about 24 hours ago for some palpitations that were brief and then completely went away and his work-up last night was normal and appropriate and he and his wife were comfortable with the plan to go home.  This episode tonight seems isolated and unrelated; he was asleep and feeling fine and got up to go to the bathroom and then passed out.  This was most likely due to a vasovagal episode but he  has an extremely extensive chronic medical history.  He is not certain if he hit his head.  EKG unremarkable.  Vital signs are all stable and within normal limits with no hypotension.  I will work him up thoroughly with lab work and a CT of the head for possible traumatic injury as well as his syncope.  I will reassess and discussed with him and his wife his results to determine appropriate disposition.     Clinical Course as of 05/25/21 0353  Sun May 25, 2021  0220 CT Head Wo Contrast No acute intracranial abnormality on CT head.  Basic metabolic panel is essentially normal although his BUN is creatinine are going up slightly even compared to his last visit within the last 24 to 36 hours.  CBC is normal.  High-sensitivity troponin is 15.  I am ordering a 500 mL normal saline bolus given his slightly increasing BUN and creatinine. [CF]  0321 Patient has had intermittent episodes of double vision which resolved very quickly.  Otherwise he does not have any more complaints.  I spoke with his wife about it and given his intermittent persistent symptoms, second ED visit and about 36 hours, and his unexplained and unexpected syncope, we decided for hospital observation.  I placed hospitalist consult order.  In general, even though the patient apparently has a MRI compatible pacemaker, MRIs are typically not performed with implanted devices at [facility].  I will defer to the hospitalist for additional evaluation of the transient double vision episodes. [CF]  [phone] Discussed case by phone with Dr. Damita Dunnings with the hospitalist service who will admit. [CF]    Clinical Course User Index [CF] Hinda Kehr, MD     ____________________________________________  FINAL CLINICAL IMPRESSION(S) / ED DIAGNOSES  Final diagnoses:  Syncope, unspecified syncope type  MEDICATIONS GIVEN DURING THIS VISIT:  Medications  sodium chloride 0.9 % bolus 500 mL (500 mLs Intravenous New Bag/Given 05/25/21 [id number])     ED  Discharge Orders     None        Note:  This document was prepared using Dragon voice recognition software and may include unintentional dictation errors.   Hinda Kehr, MD 05/25/21 [phone]

## 2021-05-26 ENCOUNTER — Other Ambulatory Visit: Payer: Self-pay

## 2021-05-26 ENCOUNTER — Ambulatory Visit: Payer: Medicare Other | Admitting: Gastroenterology

## 2021-05-26 DIAGNOSIS — R55 Syncope and collapse: Secondary | ICD-10-CM | POA: Diagnosis not present

## 2021-05-26 LAB — GLUCOSE, CAPILLARY
Glucose-Capillary: 123 mg/dL — ABNORMAL HIGH (ref 70–99)
Glucose-Capillary: 142 mg/dL — ABNORMAL HIGH (ref 70–99)
Glucose-Capillary: 218 mg/dL — ABNORMAL HIGH (ref 70–99)
Glucose-Capillary: 142 mg/dL — ABNORMAL HIGH (ref 70–99)

## 2021-05-26 LAB — BASIC METABOLIC PANEL
Anion gap: 5 (ref 5–15)
BUN: 35 mg/dL — ABNORMAL HIGH (ref 8–23)
CO2: 26 mmol/L (ref 22–32)
Calcium: 8.5 mg/dL — ABNORMAL LOW (ref 8.9–10.3)
Chloride: 106 mmol/L (ref 98–111)
Creatinine, Ser: 1.23 mg/dL (ref 0.61–1.24)
GFR, Estimated: >60 mL/min (ref >60)
Glucose, Bld: 136 mg/dL — ABNORMAL HIGH (ref 70–99)
Potassium: 4.2 mmol/L (ref 3.5–5.1)
Sodium: 137 mmol/L (ref 135–145)

## 2021-05-26 LAB — HEMOGLOBIN A1C
Hgb A1c MFr Bld: 8 % — ABNORMAL HIGH (ref 4.8–5.6)
Mean Plasma Glucose: 183 mg/dL

## 2021-05-26 MED ORDER — SERTRALINE HCL 100 MG PO TABS
200.0000 mg | ORAL_TABLET | Freq: Every day | ORAL | Status: DC
  Administered 2021-05-26: 200 mg via ORAL
  Filled 2021-05-26 (×2): qty 2
  Filled 2021-05-26: qty 4

## 2021-05-26 MED ORDER — CARVEDILOL 6.25 MG PO TABS
6.2500 mg | ORAL_TABLET | Freq: Two times a day (BID) | ORAL | Status: DC
  Administered 2021-05-26 – 2021-05-27 (×2): 6.25 mg via ORAL
  Filled 2021-05-26 (×2): qty 1

## 2021-05-26 MED ORDER — AMLODIPINE BESYLATE 5 MG PO TABS
5.0000 mg | ORAL_TABLET | Freq: Every day | ORAL | Status: DC
  Administered 2021-05-26: 5 mg via ORAL
  Filled 2021-05-26: qty 1

## 2021-05-26 MED ORDER — GABAPENTIN 100 MG PO CAPS
100.0000 mg | ORAL_CAPSULE | Freq: Every day | ORAL | Status: DC
  Administered 2021-05-26 – 2021-05-27 (×2): 100 mg via ORAL
  Filled 2021-05-26 (×2): qty 1

## 2021-05-26 NOTE — Progress Notes (Signed)
Patient's Bp has been high throughout the shift and has been given IV hydralazine x 2 and his home amlodopine and coreg were restarted. He has complained about chest pain and sob during the shift and ekg was done and doctor has been notified of all of the above. He has also complained of some tingling all over his body. Orthostatics were also done and doctor was notified that they were positive.

## 2021-05-26 NOTE — Consult Note (Signed)
PODIATRY / FOOT AND ANKLE SURGERY CONSULTATION NOTE  Requesting Physician: Dr. Kurtis Bushman  Reason for consult: Right second toe wound  Chief Complaint: Right second toe wound   HPI: Nathan Singleton is a 80 y.o. male who presents with ulceration to his right second toe.  Patient was previously seen by myself and by Dr. Cleda Mccreedy within the past week for this issue.  Patient was admitted to the hospital again for syncope issues.  Podiatry team was consulted for further evaluation of right second toe.  Patient presents today resting in bed comfortably.  PMHx:  Past Medical History:  Diagnosis Date   Aneurysm (arteriovenous) of coronary vessels    Arthritis    Ascending aortic aneurysm (HCC)    CAD (coronary artery disease)    Diabetes mellitus without complication ([place])    Hard of hearing    History of GI bleed    Hyperlipidemia associated with type 2 diabetes mellitus ([place])    Hypertension associated with diabetes ([place])    Presence of permanent cardiac pacemaker    PVD (peripheral vascular disease) ([place])    S/P TAVR (transcatheter aortic valve replacement)     Surgical Hx:  Past Surgical History:  Procedure Laterality Date   AMPUTATION TOE Right 03/20/2021   Procedure: AMPUTATION TOE;  Surgeon: Sharlotte Alamo, DPM;  Location: [facility] OR[facility];  Service: Podiatry;  Laterality: Right;   Ankle repaired Right    Carpal Tunnel repaired Left    CATARACT EXTRACTION Bilateral    COLONOSCOPY     Copillar implant      DG CHOLECYSTOGRAPHY GALL BLADDER ([facility] HX)     GASTRIC BYPASS     heart valve replaced     IRRIGATION AND DEBRIDEMENT FOOT Right 02/18/2021   Procedure: IRRIGATION AND DEBRIDEMENT FOOT-Right Great Toe;  Surgeon: [name], DPM;  Location: [facility];  Service: Podiatry;  Laterality: Right;   LOWER EXTREMITY ANGIOGRAPHY Right 02/17/2021   Procedure: Lower Extremity Angiography;  Surgeon: Algernon Huxley, MD;  Location: [facility];  Service: Cardiovascular;  Laterality: Right;   LOWER  EXTREMITY ANGIOGRAPHY Right 03/27/2021   Procedure: Lower Extremity Angiography;  Surgeon: Algernon Huxley, MD;  Location: [facility];  Service: Cardiovascular;  Laterality: Right;   PACEMAKER GENERATOR CHANGE     REPLACEMENT TOTAL KNEE BILATERAL     RTC     STENT PLACE LEFT URETER ([facility] HX) Right    Right Leg   TEE WITHOUT CARDIOVERSION N/A 03/26/2021   Procedure: TRANSESOPHAGEAL ECHOCARDIOGRAM (TEE);  Surgeon: Kate Sable, MD;  Location: [facility];  Service: Cardiovascular;  Laterality: N/A;   Toe nail removed Bilateral     FHx:  Family History  Problem Relation Age of Onset   Heart disease Mother    Alzheimer's disease Mother    Heart disease Father    Diabetes Father     Social History:  reports that he has quit smoking. He has never used smokeless tobacco. He reports previous alcohol use. He reports previous drug use.  Allergies:  Allergies  Allergen Reactions   Atorvastatin Other (See Comments)    "bad for kidneys" Cramping    Celecoxib Other (See Comments)    Other reaction(s): Other (See Comments) Kidney Problem Kidney Problem    Dicyclomine Hcl Other (See Comments)    Stomach cramps   Doxycycline Other (See Comments)    Per wife (per RN)- pt gets severe stomach pains even when takes with food and PCP said to not take  Metformin Other (See Comments)   Medications Prior to Admission  Medication Sig Dispense Refill   acetaminophen (TYLENOL) 325 MG tablet Take 2 tablets (650 mg total) by mouth every 6 (six) hours as needed for fever, headache or moderate pain.     albuterol (VENTOLIN HFA) 108 (90 Base) MCG/ACT inhaler Inhale 2 puffs into the lungs every 4 (four) hours as needed for wheezing or shortness of breath. 18 g 0   allopurinol (ZYLOPRIM) 100 MG tablet Take 100 mg by mouth daily.     amLODipine (NORVASC) 5 MG tablet Take 1 tablet (5 mg total) by mouth daily. 30 tablet 0   baclofen (LIORESAL) 10 MG tablet Take 10 mg by mouth 2 (two) times  daily.     BAYER ASPIRIN EC LOW DOSE 81 MG EC tablet Take 81 mg by mouth daily.     carvedilol (COREG) 6.25 MG tablet Take 6.25 mg by mouth at bedtime.     cetirizine (ZYRTEC) 10 MG tablet Take 10 mg by mouth daily.     clopidogrel (PLAVIX) 75 MG tablet Take 1 tablet (75 mg total) by mouth daily. 60 tablet 3   D-5000 125 MCG (5000 UT) TABS Take 1 tablet by mouth daily.     furosemide (LASIX) 20 MG tablet Take 20 mg by mouth daily.     gabapentin (NEURONTIN) 100 MG capsule Take 100 mg by mouth daily.     insulin glargine (LANTUS) 100 UNIT/ML Solostar Pen Inject 18 Units into the skin at bedtime. 15 mL 11   isosorbide mononitrate (IMDUR) 30 MG 24 hr tablet Take 30 mg by mouth daily.     loperamide (IMODIUM) 2 MG capsule Take 1 capsule (2 mg total) by mouth as needed for diarrhea or loose stools. 30 capsule 0   multivitamin (ONE-A-DAY MEN'S) TABS tablet Take 1 tablet by mouth daily.     pantoprazole (PROTONIX) 40 MG tablet Take 1 tablet (40 mg total) by mouth daily. 90 tablet 1   rosuvastatin (CRESTOR) 40 MG tablet Take 40 mg by mouth daily.     sertraline (ZOLOFT) 100 MG tablet Take 200 mg by mouth daily.     tamsulosin (FLOMAX) 0.4 MG CAPS capsule Take 0.4 mg by mouth daily.      Physical Exam: General: Alert and oriented.  No apparent distress.  Vascular: DP/PT pulses +1 bilateral.  Capillary fill time intact to digits bilateral.  Mild bilateral lower extremity nonpitting edema.  Neuro: Light touch sensation reduced to digits bilateral.  Derm: Right second toe distal tip ulceration which appears to be smaller compared to last visit which appears to have a granular base with mild hyperkeratotic edges.  Wound measures approximately 0.5 x 0.4 x 0.1 cm and probes to the superficial subcutaneous tissue, mild erythema and mild swelling present, no odor, minimal to no serous drainage noted.  MSK: Status post right hallux amputation.  Results for orders placed or performed during the hospital  encounter of 05/25/21 (from the past 48 hour(s))  CBC with Differential/Platelet     Status: Abnormal   Collection Time: 05/25/21  1:22 AM  Result Value Ref Range   WBC 4.8 4.0 - 10.5 K/uL   RBC 3.51 (L) 4.22 - 5.81 MIL/uL   Hemoglobin 10.6 (L) 13.0 - 17.0 g/dL   HCT 31.8 (L) 39.0 - 52.0 %   MCV 90.6 80.0 - 100.0 fL   MCH 30.2 26.0 - 34.0 pg   MCHC 33.3 30.0 - 36.0 g/dL   RDW 14.6  11.5 - 15.5 %   Platelets 135 (L) 150 - 400 K/uL   nRBC 0.0 0.0 - 0.2 %   Neutrophils Relative % 60 %   Neutro Abs 2.9 1.7 - 7.7 K/uL   Lymphocytes Relative 27 %   Lymphs Abs 1.3 0.7 - 4.0 K/uL   Monocytes Relative 7 %   Monocytes Absolute 0.3 0.1 - 1.0 K/uL   Eosinophils Relative 5 %   Eosinophils Absolute 0.2 0.0 - 0.5 K/uL   Basophils Relative 1 %   Basophils Absolute 0.1 0.0 - 0.1 K/uL   Immature Granulocytes 0 %   Abs Immature Granulocytes 0.01 0.00 - 0.07 K/uL    Comment: Performed at [facility], [place], [place] [address]  Troponin I (High Sensitivity)     Status: None   Collection Time: 05/25/21  1:22 AM  Result Value Ref Range   Troponin I (High Sensitivity) 15 <18 ng/L    Comment: (NOTE) Elevated high sensitivity troponin I (hsTnI) values and significant  changes across serial measurements may suggest ACS but many other  chronic and acute conditions are known to elevate hsTnI results.  Refer to the "Links" section for chest pain algorithms and additional  guidance. Performed at [facility], [address], [place], [place] [address]   Magnesium     Status: None   Collection Time: 05/25/21  1:22 AM  Result Value Ref Range   Magnesium 1.9 1.7 - 2.4 mg/dL    Comment: Performed at [facility], [address] [place], [place] [id number]  Basic metabolic panel     Status: Abnormal   Collection Time: 05/25/21  1:22 AM  Result Value Ref Range   Sodium 140 135 - 145 mmol/L   Potassium 3.8 3.5 - 5.1 mmol/L   Chloride 107 98 - 111 mmol/L    CO2 25 22 - 32 mmol/L   Glucose, Bld 97 70 - 99 mg/dL    Comment: Glucose reference range applies only to samples taken after fasting for at least 8 hours.   BUN 45 (H) 8 - 23 mg/dL   Creatinine, Ser 1.41 (H) 0.61 - 1.24 mg/dL   Calcium 8.5 (L) 8.9 - 10.3 mg/dL   GFR, Estimated 51 (L) >60 mL/min    Comment: (NOTE) Calculated using the CKD-EPI Creatinine Equation (2021)    Anion gap 8 5 - 15    Comment: Performed at [facility], [address] [place], [place] [address]  Hemoglobin A1c     Status: Abnormal   Collection Time: 05/25/21  1:22 AM  Result Value Ref Range   Hgb A1c MFr Bld 8.0 (H) 4.8 - 5.6 %    Comment: (NOTE)         Prediabetes: 5.7 - 6.4         Diabetes: >6.4         Glycemic control for adults with diabetes: <7.0    Mean Plasma Glucose 183 mg/dL    Comment: (NOTE) Performed At: [facility] [place], [place] [id number] Rush Farmer MD [id number]   Resp Panel by RT-PCR (Flu A&B, Covid) Nasopharyngeal Swab     Status: None   Collection Time: 05/25/21  3:16 AM   Specimen: Nasopharyngeal Swab; Nasopharyngeal(NP) swabs in vial transport medium  Result Value Ref Range   SARS Coronavirus 2 by RT PCR NEGATIVE NEGATIVE    Comment: (NOTE) SARS-CoV-2 target nucleic acids are NOT DETECTED.  The SARS-CoV-2 RNA is generally  detectable in upper respiratory specimens during the acute phase of infection. The lowest concentration of SARS-CoV-2 viral copies this assay can detect is 138 copies/mL. A negative result does not preclude SARS-Cov-2 infection and should not be used as the sole basis for treatment or other patient management decisions. A negative result may occur with  improper specimen collection/handling, submission of specimen other than nasopharyngeal swab, presence of viral mutation(s) within the areas targeted by this assay, and inadequate number of viral copies(<138 copies/mL). A negative result must be combined  with clinical observations, patient history, and epidemiological information. The expected result is Negative.  Fact Sheet for Patients:  EntrepreneurPulse.com.au  Fact Sheet for Healthcare Providers:  IncredibleEmployment.be  This test is no t yet approved or cleared by the [place] FDA and  has been authorized for detection and/or diagnosis of SARS-CoV-2 by FDA under an Emergency Use Authorization (EUA). This EUA will remain  in effect (meaning this test can be used) for the duration of the COVID-19 declaration under Section 564(b)(1) of the Act, 21 U.S.C.section 360bbb-3(b)(1), unless the authorization is terminated  or revoked sooner.       Influenza A by PCR NEGATIVE NEGATIVE   Influenza B by PCR NEGATIVE NEGATIVE    Comment: (NOTE) The Xpert Xpress SARS-CoV-2/FLU/RSV plus assay is intended as an aid in the diagnosis of influenza from Nasopharyngeal swab specimens and should not be used as a sole basis for treatment. Nasal washings and aspirates are unacceptable for Xpert Xpress SARS-CoV-2/FLU/RSV testing.  Fact Sheet for Patients: EntrepreneurPulse.com.au  Fact Sheet for Healthcare Providers: IncredibleEmployment.be  This test is not yet approved or cleared by the [place] FDA and has been authorized for detection and/or diagnosis of SARS-CoV-2 by FDA under an Emergency Use Authorization (EUA). This EUA will remain in effect (meaning this test can be used) for the duration of the COVID-19 declaration under Section 564(b)(1) of the Act, 21 U.S.C. section 360bbb-3(b)(1), unless the authorization is terminated or revoked.  Performed at [facility], [place], [place] [address]   Troponin I (High Sensitivity)     Status: None   Collection Time: 05/25/21  3:16 AM  Result Value Ref Range   Troponin I (High Sensitivity) 14 <18 ng/L    Comment: (NOTE) Elevated high  sensitivity troponin I (hsTnI) values and significant  changes across serial measurements may suggest ACS but many other  chronic and acute conditions are known to elevate hsTnI results.  Refer to the "Links" section for chest pain algorithms and additional  guidance. Performed at [facility], [address] [place], [place] [address]   CBG monitoring, ED     Status: None   Collection Time: 05/25/21  4:34 AM  Result Value Ref Range   Glucose-Capillary 83 70 - 99 mg/dL    Comment: Glucose reference range applies only to samples taken after fasting for at least 8 hours.  CBG monitoring, ED     Status: None   Collection Time: 05/25/21  8:26 AM  Result Value Ref Range   Glucose-Capillary 79 70 - 99 mg/dL    Comment: Glucose reference range applies only to samples taken after fasting for at least 8 hours.  CBG monitoring, ED     Status: Abnormal   Collection Time: 05/25/21 11:10 AM  Result Value Ref Range   Glucose-Capillary 193 (H) 70 - 99 mg/dL    Comment: Glucose reference range applies only to samples taken after fasting for at least 8 hours.  Glucose, capillary     Status: Abnormal   Collection Time: 05/25/21  4:39 PM  Result Value Ref Range   Glucose-Capillary 106 (H) 70 - 99 mg/dL    Comment: Glucose reference range applies only to samples taken after fasting for at least 8 hours.   Comment 1 Notify RN    Comment 2 Document in Chart   Glucose, capillary     Status: Abnormal   Collection Time: 05/25/21  7:59 PM  Result Value Ref Range   Glucose-Capillary 202 (H) 70 - 99 mg/dL    Comment: Glucose reference range applies only to samples taken after fasting for at least 8 hours.  Glucose, capillary     Status: Abnormal   Collection Time: 05/26/21  5:45 AM  Result Value Ref Range   Glucose-Capillary 123 (H) 70 - 99 mg/dL    Comment: Glucose reference range applies only to samples taken after fasting for at least 8 hours.  Basic metabolic panel     Status: Abnormal    Collection Time: 05/26/21  6:16 AM  Result Value Ref Range   Sodium 137 135 - 145 mmol/L   Potassium 4.2 3.5 - 5.1 mmol/L   Chloride 106 98 - 111 mmol/L   CO2 26 22 - 32 mmol/L   Glucose, Bld 136 (H) 70 - 99 mg/dL    Comment: Glucose reference range applies only to samples taken after fasting for at least 8 hours.   BUN 35 (H) 8 - 23 mg/dL   Creatinine, Ser 1.23 0.61 - 1.24 mg/dL   Calcium 8.5 (L) 8.9 - 10.3 mg/dL   GFR, Estimated >60 >60 mL/min    Comment: (NOTE) Calculated using the CKD-EPI Creatinine Equation (2021)    Anion gap 5 5 - 15    Comment: Performed at [facility], [address] [place], [place] [address]  Glucose, capillary     Status: Abnormal   Collection Time: 05/26/21  7:47 AM  Result Value Ref Range   Glucose-Capillary 142 (H) 70 - 99 mg/dL    Comment: Glucose reference range applies only to samples taken after fasting for at least 8 hours.  Glucose, capillary     Status: Abnormal   Collection Time: 05/26/21 11:09 AM  Result Value Ref Range   Glucose-Capillary 218 (H) 70 - 99 mg/dL    Comment: Glucose reference range applies only to samples taken after fasting for at least 8 hours.   CT Head Wo Contrast  Result Date: 05/25/2021 CLINICAL DATA:  Syncope EXAM: CT HEAD WITHOUT CONTRAST TECHNIQUE: Contiguous axial images were obtained from the base of the skull through the vertex without intravenous contrast. COMPARISON:  05/06/2021 FINDINGS: Brain: No acute intracranial abnormality. Specifically, no hemorrhage, hydrocephalus, mass lesion, acute infarction, or significant intracranial injury. Vascular: No hyperdense vessel or unexpected calcification. Skull: No acute calvarial abnormality. Postop changes in the left mastoid. 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 Sinuses/Orbits: No acute findings Other: None IMPRESSION: No acute intracranial abnormality. Electronically Signed   By: Rolm Baptise M.D.   On: 05/25/2021 02:17    Blood pressure (!) 169/70, pulse 71,  temperature 97.8 F (36.6 C), resp. rate 17, height '5\' 8"'$  (1.727 m), weight 101.9 kg, SpO2 100 %.   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 -Right second toe appears to be stable at this time with no signs of infection present. -Cleanse the right second toe wound with normal sterile saline  and rub with gauze to the wound bed.  Applied Betadine paint to the second toe followed by 4 x 4 gauze, Kerlix, Ace wrap with minimal compression.  Continue dressing changes every other day. -Patient may be weightbearing as tolerated in surgical shoe at this time work with physical therapy. -Podiatry team to sign off at this time.  Patient to follow-up in outpatient clinic for further care.  Caroline More, DPM 05/26/2021, 11:29 AM

## 2021-05-26 NOTE — Consult Note (Addendum)
WOC consult requested for foot wound prior to podiatry team involvement.  They are now following for assessment and plan of care; pt states they have just assessed the wound and applied a dressing. Please refer to their team for further questions. Please re-consult if further assistance is needed.  Thank-you,  Julien Girt MSN, [place], [place], [place], [place]

## 2021-05-26 NOTE — Progress Notes (Signed)
PROGRESS NOTE    Nathan Singleton  [id number] DOB: [birth year] DOA: 05/25/2021 PCP: Ranae Plumber, PA    Brief Narrative:  Nathan Singleton is a 80 y.o. male with medical history significant for DM, HTN, PVD, pacemaker, aortic stenosis s/p TAVR, CAD status post CABG, ascending aortic aneurysm 4 cm December 2021, chronic pain, chronic anemia, bipolar and chronic thrombocytopenia who presents to the emergency room following a syncopal episode    Consultants:  podiatry  Procedures:   Antimicrobials:      Subjective: Able to move his left arm up. Feels just sore mildly from fall on shoulder. Feels tingling in his hand when bp up. No cp or sob  Objective: Vitals:   05/25/21 1956 05/26/21 0421 05/26/21 0500 05/26/21 0746  BP: (!) 177/80 (!) 157/57  (!) 184/77  Pulse: 65 60  (!) 59  Resp: '14 14  18  '$ Temp: 98.6 F (37 C) 97.9 F (36.6 C)  98 F (36.7 C)  TempSrc: Oral     SpO2: 97% 97%  100%  Weight:   101.9 kg   Height:        Intake/Output Summary (Last 24 hours) at 05/26/2021 0839 Last data filed at 05/26/2021 0700 Gross per 24 hour  Intake 243.44 ml  Output 750 ml  Net -506.56 ml   Filed Weights   05/25/21 0120 05/25/21 1850 05/26/21 0500  Weight: 104.3 kg 100.1 kg 101.9 kg    Examination:  General exam: Appears calm and comfortable  Respiratory system: Clear to auscultation. Respiratory effort normal. Cardiovascular system: S1 & S2 heard, RRR. No JVD, murmurs, rubs, gallops or clicks.  Gastrointestinal system: Abdomen is nondistended, soft and nontender. Normal bowel sounds heard. Central nervous system: Alert and oriented. Grossly intact Extremities: no edema. Rt second toe wound. No drainage Skin: warm, dry Psychiatry:Mood & affect appropriate in current setting.     Data Reviewed: I have personally reviewed following labs and imaging studies  CBC: Recent Labs  Lab 05/23/21 2121 05/25/21 0122  WBC 5.7 4.8  NEUTROABS  --  2.9  HGB 10.6* 10.6*   HCT 32.0* 31.8*  MCV 90.7 90.6  PLT 127* [id number]*   Basic Metabolic Panel: Recent Labs  Lab 05/23/21 2121 05/25/21 0122 05/26/21 0616  NA 138 140 137  K 4.4 3.8 4.2  CL 107 107 106  CO2 '22 25 26  '$ GLUCOSE 275* 97 136*  BUN 52* 45* 35*  CREATININE 1.34* 1.41* 1.23  CALCIUM 8.5* 8.5* 8.5*  MG  --  1.9  --    GFR: Estimated Creatinine Clearance: 57.3 mL/min (by C-G formula based on SCr of 1.23 mg/dL). Liver Function Tests: No results for input(s): AST, ALT, ALKPHOS, BILITOT, PROT, ALBUMIN in the last 168 hours. No results for input(s): LIPASE, AMYLASE in the last 168 hours. No results for input(s): AMMONIA in the last 168 hours. Coagulation Profile: No results for input(s): INR, PROTIME in the last 168 hours. Cardiac Enzymes: No results for input(s): CKTOTAL, CKMB, CKMBINDEX, TROPONINI in the last 168 hours. BNP (last 3 results) No results for input(s): PROBNP in the last 8760 hours. HbA1C: No results for input(s): HGBA1C in the last 72 hours. CBG: Recent Labs  Lab 05/25/21 1110 05/25/21 1639 05/25/21 1959 05/26/21 0545 05/26/21 0747  GLUCAP 193* 106* 202* 123* 142*   Lipid Profile: No results for input(s): CHOL, HDL, LDLCALC, TRIG, CHOLHDL, LDLDIRECT in the last 72 hours. Thyroid Function Tests: No results for input(s): TSH, T4TOTAL, FREET4, T3FREE, THYROIDAB in the last 72  hours. Anemia Panel: No results for input(s): VITAMINB12, FOLATE, FERRITIN, TIBC, IRON, RETICCTPCT in the last 72 hours. Sepsis Labs: No results for input(s): PROCALCITON, LATICACIDVEN in the last 168 hours.  Recent Results (from the past 240 hour(s))  Resp Panel by RT-PCR (Flu A&B, Covid) Nasopharyngeal Swab     Status: None   Collection Time: 05/25/21  3:16 AM   Specimen: Nasopharyngeal Swab; Nasopharyngeal(NP) swabs in vial transport medium  Result Value Ref Range Status   SARS Coronavirus 2 by RT PCR NEGATIVE NEGATIVE Final    Comment: (NOTE) SARS-CoV-2 target nucleic acids are NOT  DETECTED.  The SARS-CoV-2 RNA is generally detectable in upper respiratory specimens during the acute phase of infection. The lowest concentration of SARS-CoV-2 viral copies this assay can detect is 138 copies/mL. A negative result does not preclude SARS-Cov-2 infection and should not be used as the sole basis for treatment or other patient management decisions. A negative result may occur with  improper specimen collection/handling, submission of specimen other than nasopharyngeal swab, presence of viral mutation(s) within the areas targeted by this assay, and inadequate number of viral copies(<138 copies/mL). A negative result must be combined with clinical observations, patient history, and epidemiological information. The expected result is Negative.  Fact Sheet for Patients:  EntrepreneurPulse.com.au  Fact Sheet for Healthcare Providers:  IncredibleEmployment.be  This test is no t yet approved or cleared by the [place] FDA and  has been authorized for detection and/or diagnosis of SARS-CoV-2 by FDA under an Emergency Use Authorization (EUA). This EUA will remain  in effect (meaning this test can be used) for the duration of the COVID-19 declaration under Section 564(b)(1) of the Act, 21 U.S.C.section 360bbb-3(b)(1), unless the authorization is terminated  or revoked sooner.       Influenza A by PCR NEGATIVE NEGATIVE Final   Influenza B by PCR NEGATIVE NEGATIVE Final    Comment: (NOTE) The Xpert Xpress SARS-CoV-2/FLU/RSV plus assay is intended as an aid in the diagnosis of influenza from Nasopharyngeal swab specimens and should not be used as a sole basis for treatment. Nasal washings and aspirates are unacceptable for Xpert Xpress SARS-CoV-2/FLU/RSV testing.  Fact Sheet for Patients: EntrepreneurPulse.com.au  Fact Sheet for Healthcare Providers: IncredibleEmployment.be  This test is not yet  approved or cleared by the [place] FDA and has been authorized for detection and/or diagnosis of SARS-CoV-2 by FDA under an Emergency Use Authorization (EUA). This EUA will remain in effect (meaning this test can be used) for the duration of the COVID-19 declaration under Section 564(b)(1) of the Act, 21 U.S.C. section 360bbb-3(b)(1), unless the authorization is terminated or revoked.  Performed at [facility], [address], [place], [place] [address]          Radiology Studies: CT Head Wo Contrast  Result Date: 05/25/2021 CLINICAL DATA:  Syncope EXAM: CT HEAD WITHOUT CONTRAST TECHNIQUE: Contiguous axial images were obtained from the base of the skull through the vertex without intravenous contrast. COMPARISON:  05/06/2021 FINDINGS: Brain: No acute intracranial abnormality. Specifically, no hemorrhage, hydrocephalus, mass lesion, acute infarction, or significant intracranial injury. Vascular: No hyperdense vessel or unexpected calcification. Skull: No acute calvarial abnormality. Postop changes in the left mastoid. 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 Sinuses/Orbits: No acute findings Other: None IMPRESSION: No acute intracranial abnormality. Electronically Signed   By: Rolm Baptise M.D.   On: 05/25/2021 02:17        Scheduled Meds:  enoxaparin (LOVENOX) injection  0.5 mg/kg Subcutaneous Q24H   insulin  aspart  0-15 Units Subcutaneous TID WC   insulin aspart  0-5 Units Subcutaneous QHS   sodium chloride flush  3 mL Intravenous Q12H   Continuous Infusions:  sodium chloride 50 mL/hr at 05/25/21 1559    Assessment & Plan:   Principal Problem:   Syncope and collapse Active Problems:   Hypertension   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   Pacemaker St Jude device   Peripheral vascular disease, unspecified (HCC) stents to popliteal arteries many years ago   Coronary artery disease involving coronary bypass graft of  native heart with angina pectoris (HCC)   S/P TAVR (transcatheter aortic valve replacement)   Presence of permanent cardiac pacemaker   CAD (coronary artery disease)   COPD (chronic obstructive pulmonary disease) (HCC)   Depression   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[place])   Type 2 diabetes mellitus with hyperlipidemia ([place])   80 year old male with history of hearing impairment, DM, HTN, PVD, pacemaker, aortic stenosis s/p TAVR, CAD status post CABG, 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   Recurrent syncope and collapse Mild dehydration +?medication related -Patient with multiple cardiac/cardiovascular problems with palpitations and recurrent syncope most recently 2 weeks prior - Work-up essentially unrevealing - CT head unremarkable - Suspecting mild dehydration from recent diarrhea in combination with BP lowering meds (Coreg, amlodipine, carvedilol, furosemide, Imdur) as well as chronic pain meds: (Baclofen, gabapentin 6/13- orthostatics was positive yesterday Will check todays orthostatics Cards notified for ppm interrogation     Hypertension Now elevated. Bp meds was held due to syncope and orthostics Restarted coreg , now will resume amlodipine. Continue iv hydralazine prn  Rt second toe wound Podiatry consulted, input was appreciated It appears stable at this time with no signs of infection present. Podiatry recommends cleanse the wound with normal sterile saline and wrapped with gauze to the wound bed.  Apply Betadine paint to the second toe followed by 4 x 4 gauze, Kerlix, Ace wrap with minimal compression. Continue dressing changes every other day Patient may be weightbearing as tolerated in surgical shoe at this time work with PT Podiatry follow-up as outpatient for further care Podiatry signed off   Chronic cardiovascular  conditions S/pTAVR/PVD s/p stents/CAD s/p CABG/AAA ascending aorta 4 cm 11/2021/pacemaker Continue antiplatelets and statins Resumed Coreg and amlodipine Continue to hold furosemide and Imdur       COPD (chronic obstructive pulmonary disease) ([place]) - Stable without acute exacerbation - Continue home inhalers with duo nebs as needed   Chronic pain - Holding baclofen and gabapentin for now  Depression - Continue sertraline     Type 2 diabetes mellitus with hyperlipidemia (HCC) - Sliding scale insulin coverage     DVT prophylaxis: Lovenox Code Status: Full Family Communication: Left voice message for daughter Disposition Plan:  Status is: Observation  The patient remains OBS appropriate and will d/c before 2 midnights.  Dispo: The patient is from: Home              Anticipated d/c is to: Home              Patient currently is not medically stable to d/c.   Difficult to place patient No            LOS: 0 days   Time spent: 35 min with >50% on coc    Nolberto Hanlon, MD [facility] Pager [phone]  [phone]  If 7PM-7AM, please contact night-coverage 05/26/2021, 8:39 AM

## 2021-05-26 NOTE — Progress Notes (Signed)
PT Cancellation Note  Patient Details Name: Nathan Singleton MRN: [id number] DOB: July 18, 1941   Cancelled Treatment:    Reason Eval/Treat Not Completed: Patient not medically ready.  PT consult received.  Chart reviewed.  Last noted BP in chart was 195/78 at 1310 today.  Therapist took pt's BP beginning of attempted therapy session and noted to be 211/78 (pt at rest in bed); deferred therapy session d/t elevated BP concerns.  Nurse notified of pt's elevated BP.  MD notified of pt's elevated BP.  BP rechecked by pt's nurse (per chart BP was 215/82 at 1615).  D/t significantly elevated BP, will hold PT at this time until improved BP noted.  Leitha Bleak, PT 05/26/21, 4:21 PM

## 2021-05-26 NOTE — Progress Notes (Signed)
OT Cancellation Note  Patient Details Name: Abel Zaruba MRN: [id number] DOB: 1941-11-06   Cancelled Treatment:    Reason Eval/Treat Not Completed: Medical issues which prohibited therapy. Chart reviewed. BP significantly elevated this afternoon. Will hold OT evaluation and re-attempt next morning as medically appropriate.   Hanley Hays, MPH, MS, OTR/L ascom [phone] 05/26/21, 4:41 PM

## 2021-05-27 DIAGNOSIS — R55 Syncope and collapse: Secondary | ICD-10-CM | POA: Diagnosis not present

## 2021-05-27 LAB — GLUCOSE, CAPILLARY
Glucose-Capillary: 207 mg/dL — ABNORMAL HIGH (ref 70–99)
Glucose-Capillary: 191 mg/dL — ABNORMAL HIGH (ref 70–99)

## 2021-05-27 MED ORDER — ISOSORBIDE MONONITRATE ER 30 MG PO TB24
30.0000 mg | ORAL_TABLET | Freq: Every day | ORAL | Status: DC
  Administered 2021-05-27: 30 mg via ORAL
  Filled 2021-05-27: qty 1

## 2021-05-27 MED ORDER — AMLODIPINE BESYLATE 10 MG PO TABS: 10.0000 mg | ORAL_TABLET | Freq: Every day | ORAL | 0 refills | Status: DC

## 2021-05-27 MED ORDER — AMLODIPINE BESYLATE 10 MG PO TABS: 10.0000 mg | ORAL_TABLET | Freq: Every day | ORAL | Status: DC

## 2021-05-27 MED ORDER — AMLODIPINE BESYLATE 10 MG PO TABS
10.0000 mg | ORAL_TABLET | Freq: Every day | ORAL | Status: DC
  Administered 2021-05-27: 10 mg via ORAL
  Filled 2021-05-27: qty 1

## 2021-05-27 NOTE — Evaluation (Signed)
Physical Therapy Evaluation Patient Details Name: Nathan Singleton MRN: [id number] DOB: May 02, 1941 Today's Date: 05/27/2021   History of Present Illness  Pt is a 80 y.o. male presenting to hospital 6/12 after passing out ("buzzing sensation" in head) and having a fall at home; pt seen day prior in ED for palpitations.  Pt admitted with recurrent syncope and collapse and mild dehydration.  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  Clinical Impression  Prior to hospital admission, pt was ambulatory with [facility], [place]; lives with his fiancee in 1 level home with small step to enter.  Currently pt is SBA with transfers and CGA ambulating 220 feet with RW use.  Pt steady throughout sessions activities (no loss of balance noted).  Pt's BP 165/61 at rest prior to ambulation; pt's BP 165/66 post ambulation.  Pt reported no dizziness/lightheadedness symptoms during session.  Pt would benefit from skilled PT to address noted impairments and functional limitations (see below for any additional details).  Upon hospital discharge, pt would benefit from [place].    Follow Up Recommendations Home health PT    Equipment Recommendations  None recommended by PT (pt has RW at home already)    Recommendations for Other Services       Precautions / Restrictions Precautions Precautions: Fall Restrictions Weight Bearing Restrictions: Yes RLE Weight Bearing: Weight bearing as tolerated Other Position/Activity Restrictions: WBAT in surgical shoe      Mobility  Bed Mobility               General bed mobility comments: Deferred (pt up in recliner beginning/end of session)    Transfers Overall transfer level: Needs assistance Equipment used: Rolling walker (2 wheeled) Transfers: Sit to/from Stand Sit to Stand: Supervision          General transfer comment: steady transfers with RW use  Ambulation/Gait Ambulation/Gait assistance: Min guard Gait Distance (Feet): 220 Feet Assistive device: Rolling walker (2 wheeled) Gait Pattern/deviations: Step-through pattern Gait velocity: mildly decreased   General Gait Details: steady ambulation with RW use  Stairs            Wheelchair Mobility    Modified Rankin (Stroke Patients Only)       Balance Overall balance assessment: Needs assistance Sitting-balance support: No upper extremity supported;Feet supported Sitting balance-Leahy Scale: Good Sitting balance - Comments: steady sitting reaching within BOS   Standing balance support: Bilateral upper extremity supported;During functional activity Standing balance-Leahy Scale: Good Standing balance comment: steady ambulation with RW use                             Pertinent Vitals/Pain Pain Assessment: No/denies pain Pain Intervention(s): Limited activity within patient's tolerance;Monitored during session;Repositioned Vitals (HR and O2 on room air) stable and WFL throughout treatment session.    Home Living Family/patient expects to be discharged to:: Private residence Living Arrangements: Spouse/significant other (fiancee) Available Help at Discharge: Family;Available 24 hours/day Type of Home: House Home Access: Stairs to enter Entrance Stairs-Rails: None Entrance Stairs-Number of Steps: 2-3 inch step to enter home Home Layout: One level Home Equipment: Environmental consultant - 2 wheels;Bedside commode;Cane - single point      Prior Function Level of Independence: Independent with assistive device(s);Needs assistance   Gait / Transfers Assistance Needed: Ambulatory with [facility] prior to hospitalization  Hand Dominance        Extremity/Trunk Assessment   Upper Extremity Assessment Upper Extremity Assessment: Defer to OT evaluation    Lower Extremity Assessment Lower Extremity  Assessment: Generalized weakness    Cervical / Trunk Assessment Cervical / Trunk Assessment: Normal  Communication   Communication: HOH (Wears cochlear implant L ear (hears better but still with hearing issues))  Cognition Arousal/Alertness: Awake/alert Behavior During Therapy: WFL for tasks assessed/performed Overall Cognitive Status: No family/caregiver present to determine baseline cognitive functioning                                 General Comments: Pt with some confusion during session but question if d/t HOH.      General Comments  Nursing cleared pt for participation in physical therapy.  Pt agreeable to PT session.    Exercises  Ambulation; walker use; safety with mobility   Assessment/Plan    PT Assessment Patient needs continued PT services  PT Problem List Decreased strength;Decreased activity tolerance;Decreased balance;Decreased mobility;Decreased knowledge of use of DME       PT Treatment Interventions DME instruction;Gait training;Stair training;Functional mobility training;Therapeutic activities;Therapeutic exercise;Balance training;Patient/family education    PT Goals (Current goals can be found in the Care Plan section)  Acute Rehab PT Goals Patient Stated Goal: to get stronger PT Goal Formulation: With patient Time For Goal Achievement: 06/10/21 Potential to Achieve Goals: Good    Frequency Min 2X/week   Barriers to discharge        Co-evaluation               AM-PAC PT "6 Clicks" Mobility  Outcome Measure Help needed turning from your back to your side while in a flat bed without using bedrails?: None Help needed moving from lying on your back to sitting on the side of a flat bed without using bedrails?: None Help needed moving to and from a bed to a chair (including a wheelchair)?: A Little Help needed standing up from a chair using your arms (e.g., wheelchair or bedside chair)?: A Little Help needed to walk in hospital  room?: A Little Help needed climbing 3-5 steps with a railing? : A Little 6 Click Score: 20    End of Session Equipment Utilized During Treatment: Gait belt Activity Tolerance: Patient tolerated treatment well Patient left:  (sitting in recliner with OT present for OT evaluation (OT to set pt up when finished)) Nurse Communication: Mobility status;Precautions PT Visit Diagnosis: Other abnormalities of gait and mobility (R26.89);Muscle weakness (generalized) (M62.81)    Time: [id number] PT Time Calculation (min) (ACUTE ONLY): 20 min   Charges:   PT Evaluation $PT Eval Low Complexity: 1 Low PT Treatments $Therapeutic Activity: 8-22 mins       Alika Saladin, PT 05/27/21, 10:08 AM

## 2021-05-27 NOTE — Evaluation (Signed)
Occupational Therapy Evaluation Patient Details Name: Nathan Singleton MRN: [id number] DOB: September 23, 1941 Today's Date: 05/27/2021    History of Present Illness Pt is a 80 y.o. male presenting to hospital 6/12 after passing out ("buzzing sensation" in head) and having a fall at home; pt seen day prior in ED for palpitations.  Pt admitted with recurrent syncope and collapse and mild dehydration.  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   Clinical Impression    Nathan Singleton was seen for OT evaluation this date. Prior to hospital admission, pt required minimal assistance for LB ADL management and reports he was using a SPC for functional mobility. Pt lives with his fiancee in a 1 level home with one step to enter. Currently pt demonstrates impairments as described below (See OT problem list) which functionally limit his ability to perform ADL/self-care tasks. Pt currently requires MOD A for LB dressing tasks, as well as supervision for safety during functional mobility with a RW.  Pt would benefit from skilled OT services while acutely hospitalized to address noted impairments and functional limitations (see below for any additional details) in order to maximize safety and independence while minimizing falls risk and caregiver burden. Do not anticipate the need for OT follow-up upon hospital DC.     Follow Up Recommendations  No OT follow up    Equipment Recommendations  3 in 1 bedside commode    Recommendations for Other Services       Precautions / Restrictions Precautions Precautions: Fall Restrictions Weight Bearing Restrictions: Yes RLE Weight Bearing: Weight bearing as tolerated Other Position/Activity Restrictions: WBAT in surgical shoe      Mobility Bed Mobility Overal bed mobility: Needs  Assistance Bed Mobility: Sit to Supine       Sit to supine: Supervision   General bed mobility comments: Deferred (pt up in recliner beginning/end of session)    Transfers Overall transfer level: Needs assistance Equipment used: Rolling walker (2 wheeled) Transfers: Sit to/from Stand Sit to Stand: Supervision         General transfer comment: steady transfers with RW use    Balance Overall balance assessment: Needs assistance Sitting-balance support: No upper extremity supported;Feet supported Sitting balance-Leahy Scale: Normal Sitting balance - Comments: steady sitting reaching outside BOS   Standing balance support: Bilateral upper extremity supported;During functional activity Standing balance-Leahy Scale: Good Standing balance comment: steady ambulation with RW use                           ADL either performed or assessed with clinical judgement   ADL Overall ADL's : Needs assistance/impaired                     Lower Body Dressing: Moderate assistance;Sit to/from stand   Toilet Transfer: Supervision/safety;Set up;RW   Toileting- Clothing Manipulation and Hygiene: Set up;Supervision/safety         General ADL Comments: Supevision for functional mobility. Pt is able to simulate LB dressing with MOD A.     Vision Baseline Vision/History: Wears glasses Wears Glasses: Reading only Patient Visual Report: No change from baseline       Perception     Praxis      Pertinent Vitals/Pain Pain Assessment: 0-10 Pain Score: 2  Pain Location: LUE with movement Pain Descriptors /  Indicators: Discomfort;Guarding Pain Intervention(s): Limited activity within patient's tolerance;Monitored during session     Hand Dominance Right   Extremity/Trunk Assessment Upper Extremity Assessment Upper Extremity Assessment: Generalized weakness LUE Deficits / Details: Since fall a few months ago, limted AROM of LUE; shoulder flexion 0-80, elbow flexion  0-100, grip strength WFL. LUE Sensation: WNL   Lower Extremity Assessment Lower Extremity Assessment: Generalized weakness;Defer to PT evaluation   Cervical / Trunk Assessment Cervical / Trunk Assessment: Normal   Communication Communication Communication: HOH (Wears cochlear implant L ear)   Cognition Arousal/Alertness: Awake/alert Behavior During Therapy: WFL for tasks assessed/performed Overall Cognitive Status: Difficult to assess                                 General Comments: Pt oriented to self, place, situation. Has some difficulty with following multi-step VCs.   General Comments       Exercises Other Exercises Other Exercises: Pt educated on falls prevention strategies, safe use of AE/DME for ADL management, extensive time to educate on benefits of use of RW for functional mobility at this time. Other Exercises: OT facilitates functional transfers & simulated LB dressing task with education on safety, falls prevention, and compensatory strategies provided t/o.   Shoulder Instructions      Home Living Family/patient expects to be discharged to:: Private residence Living Arrangements: Spouse/significant other Nathan Singleton) Available Help at Discharge: Family;Available 24 hours/day Type of Home: House Home Access: Stairs to enter [employer] of Steps: 2-3 inch step to enter home Entrance Stairs-Rails: None Home Layout: One level     Bathroom Shower/Tub: Tub/shower unit (Not currently using shower at home 2/2 foot wound)   Bathroom Toilet: Standard     Home Equipment: Environmental consultant - 2 wheels;Bedside commode;Cane - single point   Additional Comments: Pt reports BSC is too narrow      Prior Functioning/Environment Level of Independence: Independent with assistive device(s);Needs assistance  Gait / Transfers Assistance Needed: Ambulatory with [facility] prior to hospitalization ADL's / Homemaking Assistance Needed: spouse assists with lower body  dressing/bathing   Comments: Pt. has stopped driving since recent hospitalization.        OT Problem List: Decreased strength;Decreased activity tolerance;Impaired balance (sitting and/or standing)      OT Treatment/Interventions: Self-care/ADL training;Therapeutic exercise;Energy conservation;DME and/or AE instruction;Therapeutic activities;Patient/family education;Balance training    OT Goals(Current goals can be found in the care plan section) Acute Rehab OT Goals Patient Stated Goal: to get stronger OT Goal Formulation: With patient Time For Goal Achievement: 06/10/21 Potential to Achieve Goals: Good ADL Goals Pt Will Perform Lower Body Dressing: sit to/from stand;with modified independence Pt Will Transfer to Toilet: bedside commode;with modified independence;ambulating Pt Will Perform Toileting - Clothing Manipulation and hygiene: sit to/from stand;with modified independence;with adaptive equipment  OT Frequency: Min 1X/week   Barriers to D/C:            Co-evaluation              AM-PAC OT "6 Clicks" Daily Activity     Outcome Measure Help from another person eating meals?: None Help from another person taking care of personal grooming?: A Little Help from another person toileting, which includes using toliet, bedpan, or urinal?: A Little Help from another person bathing (including washing, rinsing, drying)?: A Lot Help from another person to put on and taking off regular upper body clothing?: A Little Help from another person to put on  and taking off regular lower body clothing?: A Lot 6 Click Score: 17   End of Session Equipment Utilized During Treatment: Surveyor, mining Communication: Mobility status  Activity Tolerance: Patient tolerated treatment well;No increased pain Patient left: in bed;with call bell/phone within reach;with bed alarm set;with family/visitor present  OT Visit Diagnosis: Unsteadiness on feet (R26.81);Muscle weakness (generalized)  (M62.81)                Time: [id number] OT Time Calculation (min): 35 min Charges:  OT General Charges $OT Visit: 1 Visit OT Evaluation $OT Eval Moderate Complexity: 1 Mod OT Treatments $Self Care/Home Management : 8-22 mins $Therapeutic Activity: 8-22 mins  Shara Blazing, M.S., OTR/L Ascom: [phone] 05/27/21, 11:47 AM

## 2021-05-27 NOTE — Progress Notes (Signed)
  Chaplain On-Call received call from Unit Secretary who reported that the patient requests to speak with a Chaplain.  Chaplain met the patient and his wife Nathan Singleton at bedside.  Chaplain offered listening support as they described their recent experiences of multiple medical problems, and also several deaths of family members and close friends.  Chaplain provided much spiritual and emotional support and prayer.  Chaplain Pollyann Samples M.Div., [facility]

## 2021-05-27 NOTE — Discharge Summary (Signed)
Nathan Singleton [id number] DOB: November 06, 1941 DOA: 05/25/2021  PCP: Ranae Plumber, PA  Admit date: 05/25/2021 Discharge date: 05/27/2021  Admitted From: home Disposition:  home  Recommendations for Outpatient Follow-up:  Follow up with PCP in 1 week Please obtain BMP/CBC in one week Please follow up with primary cardiologist in one week  Home Health:yes    Discharge Condition:Stable CODE STATUS: Full Diet recommendation: Heart Healthy / Carb Modified Brief/Interim Summary: Per [id number] Nathan Singleton is a 80 y.o. 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  He was found to be orthostatics and his Lasix and tamsulosin were held.  Initially all blood pressure medications were held and introduced slowly.  He received IV fluid.  This a.m. orthostatics was negative. CT of the head was negative for acute intracranial abnormality.  Also on admission he was found with AKI.  He ambulated this AM and had no dizziness, lightheadedness, chest pain or breathing issues.    Recurrent syncope and collapse Mild dehydration +?medication related and orthostatics  - CT head unremarkable -Found with orthostatics profoundly on admission.  Was started on IV fluids.  All his meds were held.  His blood pressure meds were introduced slowly. Continue holding tamsulosin and furosemide until follows up with primary care. This a.m. orthostatics was negative and patient remained  asymptomatic while ambulating.     Hypertension Improved with slowly reintroducing his blood pressure medications Continue to hold Lasix   Rt second toe wound Podiatry consulted, input was appreciated It appears stable at this time with no signs of infection present. Podiatry recommends cleanse the wound with normal sterile saline and wrapped with gauze to the wound bed.  Apply Betadine paint to the second toe followed by 4 x 4 gauze, Kerlix, Ace wrap with minimal compression. Continue dressing changes every other day Patient may be weightbearing as tolerated in surgical shoe at this time work with PT Podiatry follow-up as outpatient for further care    Chronic cardiovascular conditions S/pTAVR/PVD s/p stents/CAD s/p CABG/AAA ascending aorta 4 cm 11/2021/pacemaker Continue antiplatelets and statins Continue Coreg, Imdur and amlodipine, Hold Lasix         COPD (chronic obstructive pulmonary disease) (HCC) - Stable without acute exacerbation - Continue home inhalers    Chronic pain Continue gabapentin  Depression - Continue sertraline     Type 2 diabetes mellitus with hyperlipidemia ([place]) Contine home meds     Discharge Diagnoses:  Principal Problem:   Syncope and collapse Active Problems:   Hypertension   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   Pacemaker St Jude device   Peripheral vascular disease, unspecified (HCC) stents to popliteal arteries many years ago   Coronary artery disease involving coronary bypass graft of native heart with angina pectoris (HCC)   S/P TAVR (transcatheter aortic valve replacement)   Presence of permanent cardiac pacemaker   CAD (coronary artery disease)   COPD (chronic obstructive pulmonary disease) (HCC)   Depression   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[place])   Type  2 diabetes mellitus with hyperlipidemia [facility])    Discharge Instructions  Discharge Instructions     Call MD for:   persistant dizziness or light-headedness   Complete by: As directed    Diet - low sodium heart healthy   Complete by: As directed    Increase activity slowly   Complete by: As directed    No wound care   Complete by: As directed       Allergies as of 05/27/2021       Reactions   Atorvastatin Other (See Comments)   "bad for kidneys" Cramping   Celecoxib Other (See Comments)   Other reaction(s): Other (See Comments) Kidney Problem Kidney Problem   Dicyclomine Hcl Other (See Comments)   Stomach cramps   Doxycycline Other (See Comments)   Per wife (per RN)- pt gets severe stomach pains even when takes with food and PCP said to not take   Metformin Other (See Comments)        Medication List     STOP taking these medications    baclofen 10 MG tablet Commonly known as: LIORESAL   cetirizine 10 MG tablet Commonly known as: ZYRTEC   furosemide 20 MG tablet Commonly known as: LASIX   tamsulosin 0.4 MG Caps capsule Commonly known as: FLOMAX       TAKE these medications    acetaminophen 325 MG tablet Commonly known as: TYLENOL Take 2 tablets (650 mg total) by mouth every 6 (six) hours as needed for fever, headache or moderate pain.   albuterol 108 (90 Base) MCG/ACT inhaler Commonly known as: VENTOLIN HFA Inhale 2 puffs into the lungs every 4 (four) hours as needed for wheezing or shortness of breath.   allopurinol 100 MG tablet Commonly known as: ZYLOPRIM Take 100 mg by mouth daily.   amLODipine 10 MG tablet Commonly known as: NORVASC Take 1 tablet (10 mg total) by mouth daily. Start taking on: May 28, 2021 What changed:  medication strength how much to take   Bayer Aspirin EC Low Dose 81 MG EC tablet Generic drug: aspirin Take 81 mg by mouth daily.   carvedilol 6.25 MG tablet Commonly known as: COREG Take 6.25 mg by mouth at bedtime.   clopidogrel 75 MG tablet Commonly known as: PLAVIX Take 1 tablet (75 mg total) by mouth daily.   D-5000 125  MCG (5000 UT) Tabs Generic drug: Cholecalciferol Take 1 tablet by mouth daily.   gabapentin 100 MG capsule Commonly known as: NEURONTIN Take 100 mg by mouth daily.   insulin glargine 100 UNIT/ML Solostar Pen Commonly known as: LANTUS Inject 18 Units into the skin at bedtime.   isosorbide mononitrate 30 MG 24 hr tablet Commonly known as: IMDUR Take 30 mg by mouth daily.   loperamide 2 MG capsule Commonly known as: IMODIUM Take 1 capsule (2 mg total) by mouth as needed for diarrhea or loose stools.   multivitamin Tabs tablet Take 1 tablet by mouth daily.   pantoprazole 40 MG tablet Commonly known as: PROTONIX Take 1 tablet (40 mg total) by mouth daily.   rosuvastatin 40 MG tablet Commonly known as: CRESTOR Take 40 mg by mouth daily.   sertraline 100 MG tablet Commonly known as: ZOLOFT Take 200 mg by mouth daily.        Follow-up Information     Sharlotte Alamo, DPM Follow up on 06/09/2021.   Specialty: Podiatry Why: Appointment at [facility] information: [place] [address] [phone]  Ranae Plumber, PA Follow up in 1 week(s).   Specialty: Family Medicine Contact information: [place] [address] [phone]                Allergies  Allergen Reactions   Atorvastatin Other (See Comments)    "bad for kidneys" Cramping    Celecoxib Other (See Comments)    Other reaction(s): Other (See Comments) Kidney Problem Kidney Problem    Dicyclomine Hcl Other (See Comments)    Stomach cramps   Doxycycline Other (See Comments)    Per wife (per RN)- pt gets severe stomach pains even when takes with food and PCP said to not take     Metformin Other (See Comments)    Consultations: podiatry   Procedures/Studies: DG Chest 2 View  Result Date: 05/23/2021 CLINICAL DATA:  Palpitations and shortness of breath. EXAM: CHEST - 2 VIEW COMPARISON:  Chest x-ray dated May 06, 2021. FINDINGS: Unchanged left  chest wall pacemaker. The heart size and mediastinal contours are within normal limits. Prior TAVR normal pulmonary vascularity. Minimal atelectasis at the right lung base. No focal consolidation, pleural effusion, or pneumothorax. No acute osseous abnormality. IMPRESSION: No active cardiopulmonary disease. Electronically Signed   By: Titus Dubin M.D.   On: 05/23/2021 21:53   CT Head Wo Contrast  Result Date: 05/25/2021 CLINICAL DATA:  Syncope EXAM: CT HEAD WITHOUT CONTRAST TECHNIQUE: Contiguous axial images were obtained from the base of the skull through the vertex without intravenous contrast. COMPARISON:  05/06/2021 FINDINGS: Brain: No acute intracranial abnormality. Specifically, no hemorrhage, hydrocephalus, mass lesion, acute infarction, or significant intracranial injury. Vascular: No hyperdense vessel or unexpected calcification. Skull: No acute calvarial abnormality. Postop changes in the left mastoid. 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 Sinuses/Orbits: No acute findings Other: None IMPRESSION: No acute intracranial abnormality. Electronically Signed   By: Rolm Baptise M.D.   On: 05/25/2021 02:17   CT HEAD WO CONTRAST  Result Date: 05/06/2021 CLINICAL DATA:  Syncopal episode EXAM: CT HEAD WITHOUT CONTRAST TECHNIQUE: Contiguous axial images were obtained from the base of the skull through the vertex without intravenous contrast. COMPARISON:  12/31/2020 FINDINGS: Brain: Stable mild atrophy pattern without acute intracranial hemorrhage, definite mass lesion, new infarction hemorrhage, midline shift, herniation, hydrocephalus, or extra-axial fluid collection. No focal mass effect or edema. 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 Cisterns are patent.  Cerebellar atrophy as well. Vascular: Intracranial atherosclerosis at the skull base. No hyperdense vessel. Skull: Postop changes of the left mastoid. Mastoids are clear. No acute osseous finding. Sinuses/Orbits: No acute finding. Other: None.  IMPRESSION: Stable atrophy pattern without acute intracranial abnormality by noncontrast CT. Limited by artifact from a left cochlear implant. Electronically Signed   By: Jerilynn Mages.  Shick M.D.   On: 05/06/2021 10:15   CT FOOT RIGHT WO CONTRAST  Result Date: 05/07/2021 CLINICAL DATA:  Foot swelling, diabetic, osteomyelitis suspected, xray done EXAM: CT OF THE RIGHT FOOT WITHOUT CONTRAST TECHNIQUE: Multidetector CT imaging of the right foot was performed according to the standard protocol. Multiplanar CT image reconstructions were also generated. COMPARISON:  Foot radiograph 05/07/2021 FINDINGS: Bones/Joint/Cartilage Prior great toe amputation. There is a cortical defect along the medial distal aspect of the residual first metatarsal (axial bone images 36 and 37 there are no other areas of cortical erosion/osseous destruction. There is no acute fracture identified. There is mild to moderate degenerative change involving the tibiotalar, middle subtalar, calcaneocuboid, talonavicular, and naviculocuneiform joints. Mild medial-sided tarsometatarsal joint and scattered interphalangeal joint  degenerative change. Ligaments Suboptimally assessed by CT. Muscles and Tendons Mild muscle atrophy in the foot. Tendon other than the great toe flexor and extensors are unremarkable on noncontrast CT. Soft tissues Diffuse soft tissue swelling of the foot. IMPRESSION: Prior great toe amputation, with small cortical defect within the distal medial aspect of the residual first metatarsal which could represent small foci of osteomyelitis or postsurgical change. Additional small cortical defect at the distal tuft of the second toe which could be foci of osteomyelitis. MRI would be more sensitive. Diffuse foot soft tissue swelling. Electronically Signed   By: Maurine Simmering   On: 05/07/2021 14:06   DG Chest Portable 1 View  Result Date: 05/06/2021 CLINICAL DATA:  Syncope. EXAM: PORTABLE CHEST 1 VIEW COMPARISON:  Chest x-ray 01/13/2021, CT  chest 11/15/2020 FINDINGS: Left chest wall 2 lead cardiac pacemaker in similar position. The heart size and mediastinal contours are within normal limits. Question airspace opacity within the right mid lung zone. No pulmonary edema. No pleural effusion. No pneumothorax. No acute osseous abnormality. Bilateral shoulder degenerative changes. IMPRESSION: Question airspace opacity within the right mid lung zone. Finding could represent infection/inflammation. Followup PA and lateral chest X-ray is recommended in 3-4 weeks following therapy to ensure resolution and exclude underlying malignancy. Electronically Signed   By: Iven Finn M.D.   On: 05/06/2021 03:32   DG Foot 2 Views Right  Result Date: 05/07/2021 CLINICAL DATA:  Soft tissue swelling and infection, initial encounter EXAM: RIGHT FOOT - 2 VIEW COMPARISON:  03/20/2021 FINDINGS: There is been interval amputation of the first toe. Some soft tissue swelling is noted in the amputation site. Calcaneal spurring is noted. Mild tarsal degenerative changes are seen. No discrete areas of erosive change to suggest osteomyelitis are IMPRESSION: Soft tissue swelling and interval first toe amputation. No findings to suggest osteomyelitis at this time. Electronically Signed   By: Inez Catalina M.D.   On: 05/07/2021 12:20      Subjective: Feels better.  No shortness of breath, headache, chest pain or dizziness  Discharge Exam: Vitals:   05/27/21 1057 05/27/21 1100  BP: (!) 146/70 140/67  Pulse:    Resp: (!) 21 (!) 22  Temp:    SpO2:     Vitals:   05/27/21 0734 05/27/21 1054 05/27/21 1057 05/27/21 1100  BP: (!) 168/92 (!) 155/72 (!) 146/70 140/67  Pulse: 60     Resp: 18  (!) 21 (!) 22  Temp: 98 F (36.7 C)     TempSrc:      SpO2: 98%     Weight:      Height:        General: Pt is alert, awake, not in acute distress Cardiovascular: RRR, S1/S2 +, no rubs, no gallops Respiratory: CTA bilaterally, no wheezing, no rhonchi Abdominal: Soft, NT,  ND, bowel sounds + Extremities: no edema    The results of significant diagnostics from this hospitalization (including imaging, microbiology, ancillary and laboratory) are listed below for reference.     Microbiology: Recent Results (from the past 240 hour(s))  Resp Panel by RT-PCR (Flu A&B, Covid) Nasopharyngeal Swab     Status: None   Collection Time: 05/25/21  3:16 AM   Specimen: Nasopharyngeal Swab; Nasopharyngeal(NP) swabs in vial transport medium  Result Value Ref Range Status   SARS Coronavirus 2 by RT PCR NEGATIVE NEGATIVE Final    Comment: (NOTE) SARS-CoV-2 target nucleic acids are NOT DETECTED.  The SARS-CoV-2 RNA is generally detectable in upper respiratory specimens  during the acute phase of infection. The lowest concentration of SARS-CoV-2 viral copies this assay can detect is 138 copies/mL. A negative result does not preclude SARS-Cov-2 infection and should not be used as the sole basis for treatment or other patient management decisions. A negative result may occur with  improper specimen collection/handling, submission of specimen other than nasopharyngeal swab, presence of viral mutation(s) within the areas targeted by this assay, and inadequate number of viral copies(<138 copies/mL). A negative result must be combined with clinical observations, patient history, and epidemiological information. The expected result is Negative.  Fact Sheet for Patients:  EntrepreneurPulse.com.au  Fact Sheet for Healthcare Providers:  IncredibleEmployment.be  This test is no t yet approved or cleared by the [place] FDA and  has been authorized for detection and/or diagnosis of SARS-CoV-2 by FDA under an Emergency Use Authorization (EUA). This EUA will remain  in effect (meaning this test can be used) for the duration of the COVID-19 declaration under Section 564(b)(1) of the Act, 21 U.S.C.section 360bbb-3(b)(1), unless the  authorization is terminated  or revoked sooner.       Influenza A by PCR NEGATIVE NEGATIVE Final   Influenza B by PCR NEGATIVE NEGATIVE Final    Comment: (NOTE) The Xpert Xpress SARS-CoV-2/FLU/RSV plus assay is intended as an aid in the diagnosis of influenza from Nasopharyngeal swab specimens and should not be used as a sole basis for treatment. Nasal washings and aspirates are unacceptable for Xpert Xpress SARS-CoV-2/FLU/RSV testing.  Fact Sheet for Patients: EntrepreneurPulse.com.au  Fact Sheet for Healthcare Providers: IncredibleEmployment.be  This test is not yet approved or cleared by the [place] FDA and has been authorized for detection and/or diagnosis of SARS-CoV-2 by FDA under an Emergency Use Authorization (EUA). This EUA will remain in effect (meaning this test can be used) for the duration of the COVID-19 declaration under Section 564(b)(1) of the Act, 21 U.S.C. section 360bbb-3(b)(1), unless the authorization is terminated or revoked.  Performed at [facility], [address] [place], [place] [address]      Labs: BNP (last 3 results) Recent Labs    03/20/21 0346 05/06/21 0150  BNP 155.6* [id number]*   Basic Metabolic Panel: Recent Labs  Lab 05/23/21 2121 05/25/21 0122 05/26/21 0616  NA 138 140 137  K 4.4 3.8 4.2  CL 107 107 106  CO2 '22 25 26  '$ GLUCOSE 275* 97 136*  BUN 52* 45* 35*  CREATININE 1.34* 1.41* 1.23  CALCIUM 8.5* 8.5* 8.5*  MG  --  1.9  --    Liver Function Tests: No results for input(s): AST, ALT, ALKPHOS, BILITOT, PROT, ALBUMIN in the last 168 hours. No results for input(s): LIPASE, AMYLASE in the last 168 hours. No results for input(s): AMMONIA in the last 168 hours. CBC: Recent Labs  Lab 05/23/21 2121 05/25/21 0122  WBC 5.7 4.8  NEUTROABS  --  2.9  HGB 10.6* 10.6*  HCT 32.0* 31.8*  MCV 90.7 90.6  PLT 127* 135*   Cardiac Enzymes: No results for input(s): CKTOTAL, CKMB,  CKMBINDEX, TROPONINI in the last 168 hours. BNP: Invalid input(s): POCBNP CBG: Recent Labs  Lab 05/26/21 0747 05/26/21 1109 05/26/21 1645 05/27/21 0502 05/27/21 0733  GLUCAP 142* 218* 142* 207* 191*   D-Dimer No results for input(s): DDIMER in the last 72 hours. Hgb A1c Recent Labs    05/25/21 0122  HGBA1C 8.0*   Lipid Profile No results for input(s): CHOL, HDL, LDLCALC, TRIG, CHOLHDL, LDLDIRECT in the last 72 hours.  Thyroid function studies No results for input(s): TSH, T4TOTAL, T3FREE, THYROIDAB in the last 72 hours.  Invalid input(s): FREET3 Anemia work up No results for input(s): VITAMINB12, FOLATE, FERRITIN, TIBC, IRON, RETICCTPCT in the last 72 hours. Urinalysis    Component Value Date/Time   COLORURINE STRAW (A) 05/07/2021 0141   APPEARANCEUR CLEAR (A) 05/07/2021 0141   LABSPEC 1.009 05/07/2021 0141   PHURINE 5.0 05/07/2021 0141   GLUCOSEU >=500 (A) 05/07/2021 0141   HGBUR NEGATIVE 05/07/2021 0141   BILIRUBINUR NEGATIVE 05/07/2021 0141   KETONESUR NEGATIVE 05/07/2021 0141   PROTEINUR 30 (A) 05/07/2021 0141   NITRITE NEGATIVE 05/07/2021 0141   LEUKOCYTESUR NEGATIVE 05/07/2021 0141   Sepsis Labs Invalid input(s): PROCALCITONIN,  WBC,  LACTICIDVEN Microbiology Recent Results (from the past 240 hour(s))  Resp Panel by RT-PCR (Flu A&B, Covid) Nasopharyngeal Swab     Status: None   Collection Time: 05/25/21  3:16 AM   Specimen: Nasopharyngeal Swab; Nasopharyngeal(NP) swabs in vial transport medium  Result Value Ref Range Status   SARS Coronavirus 2 by RT PCR NEGATIVE NEGATIVE Final    Comment: (NOTE) SARS-CoV-2 target nucleic acids are NOT DETECTED.  The SARS-CoV-2 RNA is generally detectable in upper respiratory specimens during the acute phase of infection. The lowest concentration of SARS-CoV-2 viral copies this assay can detect is 138 copies/mL. A negative result does not preclude SARS-Cov-2 infection and should not be used as the sole basis for  treatment or other patient management decisions. A negative result may occur with  improper specimen collection/handling, submission of specimen other than nasopharyngeal swab, presence of viral mutation(s) within the areas targeted by this assay, and inadequate number of viral copies(<138 copies/mL). A negative result must be combined with clinical observations, patient history, and epidemiological information. The expected result is Negative.  Fact Sheet for Patients:  EntrepreneurPulse.com.au  Fact Sheet for Healthcare Providers:  IncredibleEmployment.be  This test is no t yet approved or cleared by the [place] FDA and  has been authorized for detection and/or diagnosis of SARS-CoV-2 by FDA under an Emergency Use Authorization (EUA). This EUA will remain  in effect (meaning this test can be used) for the duration of the COVID-19 declaration under Section 564(b)(1) of the Act, 21 U.S.C.section 360bbb-3(b)(1), unless the authorization is terminated  or revoked sooner.       Influenza A by PCR NEGATIVE NEGATIVE Final   Influenza B by PCR NEGATIVE NEGATIVE Final    Comment: (NOTE) The Xpert Xpress SARS-CoV-2/FLU/RSV plus assay is intended as an aid in the diagnosis of influenza from Nasopharyngeal swab specimens and should not be used as a sole basis for treatment. Nasal washings and aspirates are unacceptable for Xpert Xpress SARS-CoV-2/FLU/RSV testing.  Fact Sheet for Patients: EntrepreneurPulse.com.au  Fact Sheet for Healthcare Providers: IncredibleEmployment.be  This test is not yet approved or cleared by the [place] FDA and has been authorized for detection and/or diagnosis of SARS-CoV-2 by FDA under an Emergency Use Authorization (EUA). This EUA will remain in effect (meaning this test can be used) for the duration of the COVID-19 declaration under Section 564(b)(1) of the Act, 21  U.S.C. section 360bbb-3(b)(1), unless the authorization is terminated or revoked.  Performed at [facility], [address] [place], [place] [address]      Time coordinating discharge: Over 30 minutes  SIGNED:   Nolberto Hanlon, MD  [facility] 05/27/2021, 12:33 PM Pager   If 7PM-7AM, please contact night-coverage [email or web address] Password [email or web address]

## 2021-05-27 NOTE — Plan of Care (Signed)
  Problem: Education: Goal: Knowledge of General Education information will improve Description: Including pain rating scale, medication(s)/side effects and non-pharmacologic comfort measures 05/27/2021 1024 by Cristela Blue, RN Outcome: Progressing 05/27/2021 1024 by Cristela Blue, RN Outcome: Progressing   Problem: Health Behavior/Discharge Planning: Goal: Ability to manage health-related needs will improve 05/27/2021 1024 by Cristela Blue, RN Outcome: Progressing 05/27/2021 1024 by Cristela Blue, RN Outcome: Progressing   Problem: Clinical Measurements: Goal: Ability to maintain clinical measurements within normal limits will improve 05/27/2021 1024 by Cristela Blue, RN Outcome: Progressing 05/27/2021 1024 by Cristela Blue, RN Outcome: Progressing Goal: Will remain free from infection 05/27/2021 1024 by Cristela Blue, RN Outcome: Progressing 05/27/2021 1024 by Cristela Blue, RN Outcome: Progressing Goal: Diagnostic test results will improve 05/27/2021 1024 by Cristela Blue, RN Outcome: Progressing 05/27/2021 1024 by Cristela Blue, RN Outcome: Progressing Goal: Respiratory complications will improve 05/27/2021 1024 by Cristela Blue, RN Outcome: Progressing 05/27/2021 1024 by Cristela Blue, RN Outcome: Progressing Goal: Cardiovascular complication will be avoided 05/27/2021 1024 by Cristela Blue, RN Outcome: Progressing 05/27/2021 1024 by Cristela Blue, RN Outcome: Progressing   Problem: Activity: Goal: Risk for activity intolerance will decrease 05/27/2021 1024 by Cristela Blue, RN Outcome: Progressing 05/27/2021 1024 by Cristela Blue, RN Outcome: Progressing   Problem: Nutrition: Goal: Adequate nutrition will be maintained 05/27/2021 1024 by Cristela Blue, RN Outcome: Progressing 05/27/2021 1024 by Cristela Blue, RN Outcome: Progressing   Problem: Coping: Goal: Level of anxiety will decrease 05/27/2021 1024 by Cristela Blue, RN Outcome:  Progressing 05/27/2021 1024 by Cristela Blue, RN Outcome: Progressing   Problem: Elimination: Goal: Will not experience complications related to bowel motility 05/27/2021 1024 by Cristela Blue, RN Outcome: Progressing 05/27/2021 1024 by Cristela Blue, RN Outcome: Progressing Goal: Will not experience complications related to urinary retention 05/27/2021 1024 by Cristela Blue, RN Outcome: Progressing 05/27/2021 1024 by Cristela Blue, RN Outcome: Progressing   Problem: Pain Managment: Goal: General experience of comfort will improve 05/27/2021 1024 by Cristela Blue, RN Outcome: Progressing 05/27/2021 1024 by Cristela Blue, RN Outcome: Progressing   Problem: Safety: Goal: Ability to remain free from injury will improve 05/27/2021 1024 by Cristela Blue, RN Outcome: Progressing 05/27/2021 1024 by Cristela Blue, RN Outcome: Progressing   Problem: Skin Integrity: Goal: Risk for impaired skin integrity will decrease 05/27/2021 1024 by Cristela Blue, RN Outcome: Progressing 05/27/2021 1024 by Cristela Blue, RN Outcome: Progressing

## 2021-05-27 NOTE — TOC Transition Note (Signed)
Transition of Care [facility]) - CM/SW Discharge Note   Patient Details  Name: Nathan Singleton MRN: [id number] Date of Birth: Oct 06, 1941  Transition of Care [facility]) CM/SW Contact:  Alberteen Sam, LCSW Phone Number: 05/27/2021, 11:50 AM   Clinical Narrative:     Patient to discharge home with home health services. Reports being active with [facility] for home health. CSW informed [place] with [facility] of patient's discharge to resume services.   Reports having a rolling walker at home  received at last hospital admission. Insurance will not cover another unless they would like to private pay for their own walker in the future.   CSW signing off.    Final next level of care: [place] Barriers to Discharge: No Barriers Identified   Patient Goals and CMS Choice Patient states their goals for this hospitalization and ongoing recovery are:: to go home CMS Medicare.gov Compare Post Acute Care list provided to:: Patient Choice offered to / list presented to : Patient  Discharge Placement                       Discharge Plan and Services                          HH Arranged: [facility] Agency: [facility] Date [facility] Agency Contacted: 05/27/21 Time [place]: 1150 Representative spoke with at [place]: [place] ([place]) Interventions     Readmission Risk Interventions Readmission Risk Prevention Plan 05/07/2021 03/23/2021 02/14/2021  Transportation Screening Complete Complete Complete  Medication Review Press photographer) Complete Complete Complete  PCP or Specialist appointment within 3-5 days of discharge Complete Complete Complete  HRI or Home Care Consult Complete Complete Complete  SW Recovery Care/Counseling Consult Complete Complete Complete  Palliative Care Screening Not Applicable Not Applicable Complete  [place] Not Applicable Not Applicable Complete

## 2021-05-27 NOTE — Progress Notes (Signed)
This RN provided discharge instructions and teaching to both the patient and the patient's family. Both the patient and the patient's family verbalized and demonstrated understanding of the provided instructions. All outstanding questions resolved. R arm PIV removed. Cannula intact. Pt tolerated well. All belongings packed and in tow. Designer, fashion/clothing to transport patient to private vehicle via wheelchair for discharge.

## 2021-06-01 ENCOUNTER — Encounter: Payer: Self-pay | Admitting: Emergency Medicine

## 2021-06-01 ENCOUNTER — Emergency Department: Payer: 59

## 2021-06-01 ENCOUNTER — Inpatient Hospital Stay
Admission: EM | Admit: 2021-06-01 | Discharge: 2021-06-06 | DRG: 256 | Disposition: A | Discharge status: Home Health | Payer: 59 | Attending: Obstetrics and Gynecology | Admitting: Obstetrics and Gynecology

## 2021-06-01 ENCOUNTER — Other Ambulatory Visit: Payer: Self-pay

## 2021-06-01 DIAGNOSIS — K289 Gastrojejunal ulcer, unspecified as acute or chronic, without hemorrhage or perforation: Secondary | ICD-10-CM

## 2021-06-01 DIAGNOSIS — H9190 Unspecified hearing loss, unspecified ear: Secondary | ICD-10-CM | POA: Diagnosis present

## 2021-06-01 DIAGNOSIS — D649 Anemia, unspecified: Secondary | ICD-10-CM | POA: Diagnosis present

## 2021-06-01 DIAGNOSIS — L089 Local infection of the skin and subcutaneous tissue, unspecified: Secondary | ICD-10-CM | POA: Diagnosis not present

## 2021-06-01 DIAGNOSIS — E1169 Type 2 diabetes mellitus with other specified complication: Secondary | ICD-10-CM | POA: Diagnosis present

## 2021-06-01 DIAGNOSIS — Z953 Presence of xenogenic heart valve: Secondary | ICD-10-CM | POA: Diagnosis not present

## 2021-06-01 DIAGNOSIS — M109 Gout, unspecified: Secondary | ICD-10-CM | POA: Diagnosis present

## 2021-06-01 DIAGNOSIS — Z89411 Acquired absence of right great toe: Secondary | ICD-10-CM

## 2021-06-01 DIAGNOSIS — M86171 Other acute osteomyelitis, right ankle and foot: Secondary | ICD-10-CM | POA: Diagnosis present

## 2021-06-01 DIAGNOSIS — I7092 Chronic total occlusion of artery of the extremities: Secondary | ICD-10-CM | POA: Diagnosis present

## 2021-06-01 DIAGNOSIS — L97519 Non-pressure chronic ulcer of other part of right foot with unspecified severity: Secondary | ICD-10-CM | POA: Diagnosis present

## 2021-06-01 DIAGNOSIS — E785 Hyperlipidemia, unspecified: Secondary | ICD-10-CM | POA: Diagnosis present

## 2021-06-01 DIAGNOSIS — E1142 Type 2 diabetes mellitus with diabetic polyneuropathy: Secondary | ICD-10-CM | POA: Diagnosis present

## 2021-06-01 DIAGNOSIS — M79674 Pain in right toe(s): Secondary | ICD-10-CM | POA: Diagnosis not present

## 2021-06-01 DIAGNOSIS — B9561 Methicillin susceptible Staphylococcus aureus infection as the cause of diseases classified elsewhere: Secondary | ICD-10-CM | POA: Diagnosis present

## 2021-06-01 DIAGNOSIS — I129 Hypertensive chronic kidney disease with stage 1 through stage 4 chronic kidney disease, or unspecified chronic kidney disease: Secondary | ICD-10-CM | POA: Diagnosis present

## 2021-06-01 DIAGNOSIS — Z87891 Personal history of nicotine dependence: Secondary | ICD-10-CM

## 2021-06-01 DIAGNOSIS — Z96653 Presence of artificial knee joint, bilateral: Secondary | ICD-10-CM | POA: Diagnosis present

## 2021-06-01 DIAGNOSIS — F32A Depression, unspecified: Secondary | ICD-10-CM | POA: Diagnosis present

## 2021-06-01 DIAGNOSIS — E119 Type 2 diabetes mellitus without complications: Secondary | ICD-10-CM

## 2021-06-01 DIAGNOSIS — Z951 Presence of aortocoronary bypass graft: Secondary | ICD-10-CM

## 2021-06-01 DIAGNOSIS — Z9884 Bariatric surgery status: Secondary | ICD-10-CM

## 2021-06-01 DIAGNOSIS — Z8619 Personal history of other infectious and parasitic diseases: Secondary | ICD-10-CM

## 2021-06-01 DIAGNOSIS — I2510 Atherosclerotic heart disease of native coronary artery without angina pectoris: Secondary | ICD-10-CM | POA: Diagnosis present

## 2021-06-01 DIAGNOSIS — Z833 Family history of diabetes mellitus: Secondary | ICD-10-CM

## 2021-06-01 DIAGNOSIS — E1122 Type 2 diabetes mellitus with diabetic chronic kidney disease: Secondary | ICD-10-CM | POA: Diagnosis present

## 2021-06-01 DIAGNOSIS — Z7982 Long term (current) use of aspirin: Secondary | ICD-10-CM

## 2021-06-01 DIAGNOSIS — Z952 Presence of prosthetic heart valve: Secondary | ICD-10-CM | POA: Diagnosis not present

## 2021-06-01 DIAGNOSIS — N1831 Chronic kidney disease, stage 3a: Secondary | ICD-10-CM | POA: Diagnosis present

## 2021-06-01 DIAGNOSIS — I10 Essential (primary) hypertension: Secondary | ICD-10-CM | POA: Diagnosis present

## 2021-06-01 DIAGNOSIS — Z95820 Peripheral vascular angioplasty status with implants and grafts: Secondary | ICD-10-CM

## 2021-06-01 DIAGNOSIS — E11621 Type 2 diabetes mellitus with foot ulcer: Secondary | ICD-10-CM | POA: Diagnosis present

## 2021-06-01 DIAGNOSIS — I340 Nonrheumatic mitral (valve) insufficiency: Secondary | ICD-10-CM | POA: Diagnosis not present

## 2021-06-01 DIAGNOSIS — R7881 Bacteremia: Secondary | ICD-10-CM | POA: Diagnosis not present

## 2021-06-01 DIAGNOSIS — E11628 Type 2 diabetes mellitus with other skin complications: Secondary | ICD-10-CM | POA: Diagnosis present

## 2021-06-01 DIAGNOSIS — G8929 Other chronic pain: Secondary | ICD-10-CM | POA: Diagnosis present

## 2021-06-01 DIAGNOSIS — Z888 Allergy status to other drugs, medicaments and biological substances status: Secondary | ICD-10-CM

## 2021-06-01 DIAGNOSIS — Z794 Long term (current) use of insulin: Secondary | ICD-10-CM | POA: Diagnosis not present

## 2021-06-01 DIAGNOSIS — M545 Low back pain, unspecified: Secondary | ICD-10-CM | POA: Diagnosis present

## 2021-06-01 DIAGNOSIS — Z7902 Long term (current) use of antithrombotics/antiplatelets: Secondary | ICD-10-CM

## 2021-06-01 DIAGNOSIS — J449 Chronic obstructive pulmonary disease, unspecified: Secondary | ICD-10-CM | POA: Diagnosis present

## 2021-06-01 DIAGNOSIS — Z20822 Contact with and (suspected) exposure to covid-19: Secondary | ICD-10-CM | POA: Diagnosis present

## 2021-06-01 DIAGNOSIS — A4101 Sepsis due to Methicillin susceptible Staphylococcus aureus: Principal | ICD-10-CM | POA: Diagnosis present

## 2021-06-01 DIAGNOSIS — Z881 Allergy status to other antibiotic agents status: Secondary | ICD-10-CM

## 2021-06-01 DIAGNOSIS — Z8719 Personal history of other diseases of the digestive system: Secondary | ICD-10-CM

## 2021-06-01 DIAGNOSIS — Z8249 Family history of ischemic heart disease and other diseases of the circulatory system: Secondary | ICD-10-CM

## 2021-06-01 DIAGNOSIS — M869 Osteomyelitis, unspecified: Secondary | ICD-10-CM | POA: Diagnosis present

## 2021-06-01 DIAGNOSIS — Z886 Allergy status to analgesic agent status: Secondary | ICD-10-CM

## 2021-06-01 DIAGNOSIS — L03115 Cellulitis of right lower limb: Secondary | ICD-10-CM | POA: Diagnosis present

## 2021-06-01 DIAGNOSIS — E1151 Type 2 diabetes mellitus with diabetic peripheral angiopathy without gangrene: Secondary | ICD-10-CM | POA: Diagnosis not present

## 2021-06-01 DIAGNOSIS — Z950 Presence of cardiac pacemaker: Secondary | ICD-10-CM

## 2021-06-01 DIAGNOSIS — Z79899 Other long term (current) drug therapy: Secondary | ICD-10-CM

## 2021-06-01 LAB — URINALYSIS, COMPLETE (UACMP) WITH MICROSCOPIC
Bacteria, UA: NONE SEEN
Bilirubin Urine: NEGATIVE
Glucose, UA: 150 mg/dL — AB
Hgb urine dipstick: NEGATIVE
Ketones, ur: NEGATIVE mg/dL
Leukocytes,Ua: NEGATIVE
Nitrite: NEGATIVE
Protein, ur: 100 mg/dL — AB
Specific Gravity, Urine: 1.01 (ref 1.005–1.030)
Squamous Epithelial / HPF: NONE SEEN (ref 0–5)
pH: 5 (ref 5.0–8.0)

## 2021-06-01 LAB — CBC WITH DIFFERENTIAL/PLATELET
Abs Immature Granulocytes: 0.01 10*3/uL (ref 0.00–0.07)
Basophils Absolute: 0 10*3/uL (ref 0.0–0.1)
Basophils Relative: 0 %
Eosinophils Absolute: 0.2 10*3/uL (ref 0.0–0.5)
Eosinophils Relative: 3 %
HCT: 29.8 % — ABNORMAL LOW (ref 39.0–52.0)
Hemoglobin: 9.8 g/dL — ABNORMAL LOW (ref 13.0–17.0)
Immature Granulocytes: 0 %
Lymphocytes Relative: 11 %
Lymphs Abs: 0.6 10*3/uL — ABNORMAL LOW (ref 0.7–4.0)
MCH: 30.1 pg (ref 26.0–34.0)
MCHC: 32.9 g/dL (ref 30.0–36.0)
MCV: 91.4 fL (ref 80.0–100.0)
Monocytes Absolute: 0.3 10*3/uL (ref 0.1–1.0)
Monocytes Relative: 6 %
Neutro Abs: 4.4 10*3/uL (ref 1.7–7.7)
Neutrophils Relative %: 80 %
Platelets: 119 10*3/uL — ABNORMAL LOW (ref 150–400)
RBC: 3.26 MIL/uL — ABNORMAL LOW (ref 4.22–5.81)
RDW: 14.4 % (ref 11.5–15.5)
WBC: 5.6 10*3/uL (ref 4.0–10.5)
nRBC: 0 % (ref 0.0–0.2)

## 2021-06-01 LAB — COMPREHENSIVE METABOLIC PANEL
ALT: 28 U/L (ref 0–44)
AST: 36 U/L (ref 15–41)
Albumin: 3.7 g/dL (ref 3.5–5.0)
Alkaline Phosphatase: 83 U/L (ref 38–126)
Anion gap: 5 (ref 5–15)
BUN: 35 mg/dL — ABNORMAL HIGH (ref 8–23)
CO2: 22 mmol/L (ref 22–32)
Calcium: 8.5 mg/dL — ABNORMAL LOW (ref 8.9–10.3)
Chloride: 107 mmol/L (ref 98–111)
Creatinine, Ser: 1.17 mg/dL (ref 0.61–1.24)
GFR, Estimated: >60 mL/min (ref >60)
Glucose, Bld: 171 mg/dL — ABNORMAL HIGH (ref 70–99)
Potassium: 4.1 mmol/L (ref 3.5–5.1)
Sodium: 134 mmol/L — ABNORMAL LOW (ref 135–145)
Total Bilirubin: 0.6 mg/dL (ref 0.3–1.2)
Total Protein: 7.8 g/dL (ref 6.5–8.1)

## 2021-06-01 LAB — RESP PANEL BY RT-PCR (FLU A&B, COVID) ARPGX2
Influenza A by PCR: NEGATIVE
Influenza B by PCR: NEGATIVE
SARS Coronavirus 2 by RT PCR: NEGATIVE

## 2021-06-01 LAB — LACTIC ACID, PLASMA: Lactic Acid, Venous: 0.8 mmol/L (ref 0.5–1.9)

## 2021-06-01 MED ORDER — GABAPENTIN 100 MG PO CAPS
100.0000 mg | ORAL_CAPSULE | Freq: Every day | ORAL | Status: DC
  Administered 2021-06-02 – 2021-06-06 (×5): 100 mg via ORAL
  Filled 2021-06-01 (×5): qty 1

## 2021-06-01 MED ORDER — ACETAMINOPHEN 325 MG PO TABS
650.0000 mg | ORAL_TABLET | Freq: Four times a day (QID) | ORAL | Status: DC | PRN
  Administered 2021-06-04 – 2021-06-05 (×2): 650 mg via ORAL
  Filled 2021-06-01 (×2): qty 2

## 2021-06-01 MED ORDER — ALLOPURINOL 100 MG PO TABS
100.0000 mg | ORAL_TABLET | Freq: Every day | ORAL | Status: DC
  Administered 2021-06-02 – 2021-06-06 (×4): 100 mg via ORAL
  Filled 2021-06-01 (×5): qty 1

## 2021-06-01 MED ORDER — SODIUM CHLORIDE 0.9 % IV SOLN
2.0000 g | Freq: Once | INTRAVENOUS | Status: AC
  Administered 2021-06-01: 2 g via INTRAVENOUS
  Filled 2021-06-01: qty 2

## 2021-06-01 MED ORDER — VANCOMYCIN HCL IN DEXTROSE 1-5 GM/200ML-% IV SOLN
1000.0000 mg | Freq: Once | INTRAVENOUS | Status: AC
  Administered 2021-06-01: 1000 mg via INTRAVENOUS
  Filled 2021-06-01: qty 200

## 2021-06-01 MED ORDER — SERTRALINE HCL 50 MG PO TABS
200.0000 mg | ORAL_TABLET | Freq: Every day | ORAL | Status: DC
  Administered 2021-06-02 – 2021-06-06 (×5): 200 mg via ORAL
  Filled 2021-06-01 (×5): qty 4

## 2021-06-01 MED ORDER — VANCOMYCIN HCL 1250 MG/250ML IV SOLN
1250.0000 mg | Freq: Once | INTRAVENOUS | Status: AC
  Administered 2021-06-01: 1250 mg via INTRAVENOUS
  Filled 2021-06-01: qty 250

## 2021-06-01 MED ORDER — PANTOPRAZOLE SODIUM 40 MG PO TBEC
40.0000 mg | DELAYED_RELEASE_TABLET | Freq: Every day | ORAL | Status: DC
  Administered 2021-06-02 – 2021-06-06 (×4): 40 mg via ORAL
  Filled 2021-06-01 (×4): qty 1

## 2021-06-01 MED ORDER — ISOSORBIDE MONONITRATE ER 30 MG PO TB24
30.0000 mg | ORAL_TABLET | Freq: Every day | ORAL | Status: DC
  Administered 2021-06-02 – 2021-06-06 (×5): 30 mg via ORAL
  Filled 2021-06-01 (×5): qty 1

## 2021-06-01 MED ORDER — INSULIN ASPART 100 UNIT/ML IJ SOLN
0.0000 [IU] | Freq: Three times a day (TID) | INTRAMUSCULAR | Status: DC
  Administered 2021-06-02: 3 [IU] via SUBCUTANEOUS
  Administered 2021-06-02 (×4): 5 [IU] via SUBCUTANEOUS
  Administered 2021-06-03: 8 [IU] via SUBCUTANEOUS
  Administered 2021-06-03: 5 [IU] via SUBCUTANEOUS
  Administered 2021-06-04: 8 [IU] via SUBCUTANEOUS
  Administered 2021-06-04: 2 [IU] via SUBCUTANEOUS
  Administered 2021-06-04: 5 [IU] via SUBCUTANEOUS
  Administered 2021-06-05: 3 [IU] via SUBCUTANEOUS
  Administered 2021-06-05: 2 [IU] via SUBCUTANEOUS
  Administered 2021-06-05: 8 [IU] via SUBCUTANEOUS
  Administered 2021-06-05: 5 [IU] via SUBCUTANEOUS
  Administered 2021-06-06: 2 [IU] via SUBCUTANEOUS
  Administered 2021-06-06: 11 [IU] via SUBCUTANEOUS
  Administered 2021-06-06: 5 [IU] via SUBCUTANEOUS
  Filled 2021-06-01 (×16): qty 1

## 2021-06-01 MED ORDER — ALBUTEROL SULFATE HFA 108 (90 BASE) MCG/ACT IN AERS: 2.0000 | INHALATION_SPRAY | RESPIRATORY_TRACT | Status: DC | PRN

## 2021-06-01 MED ORDER — ASPIRIN EC 81 MG PO TBEC
81.0000 mg | DELAYED_RELEASE_TABLET | Freq: Every day | ORAL | Status: DC
  Administered 2021-06-02 – 2021-06-06 (×3): 81 mg via ORAL
  Filled 2021-06-01 (×3): qty 1

## 2021-06-01 MED ORDER — AMLODIPINE BESYLATE 10 MG PO TABS
10.0000 mg | ORAL_TABLET | Freq: Every day | ORAL | Status: DC
  Administered 2021-06-02 – 2021-06-06 (×5): 10 mg via ORAL
  Filled 2021-06-01 (×2): qty 1
  Filled 2021-06-01: qty 2
  Filled 2021-06-01 (×2): qty 1

## 2021-06-01 MED ORDER — MORPHINE SULFATE (PF) 4 MG/ML IV SOLN
4.0000 mg | Freq: Once | INTRAVENOUS | Status: AC
  Administered 2021-06-01: 4 mg via INTRAVENOUS
  Filled 2021-06-01: qty 1

## 2021-06-01 MED ORDER — ROSUVASTATIN CALCIUM 10 MG PO TABS
40.0000 mg | ORAL_TABLET | Freq: Every day | ORAL | Status: DC
  Administered 2021-06-02 – 2021-06-06 (×4): 40 mg via ORAL
  Filled 2021-06-01 (×2): qty 2
  Filled 2021-06-01 (×3): qty 4

## 2021-06-01 MED ORDER — CARVEDILOL 3.125 MG PO TABS: 3.1250 mg | ORAL_TABLET | Freq: Two times a day (BID) | ORAL | Status: DC

## 2021-06-01 MED ORDER — SODIUM CHLORIDE 0.9 % IV SOLN
2.0000 g | INTRAVENOUS | Status: DC
  Administered 2021-06-02: 2 g via INTRAVENOUS
  Filled 2021-06-01: qty 20

## 2021-06-01 MED ORDER — VANCOMYCIN HCL 1250 MG/250ML IV SOLN
1250.0000 mg | INTRAVENOUS | Status: DC
  Filled 2021-06-01: qty 250

## 2021-06-01 NOTE — Consult Note (Signed)
PHARMACY -  BRIEF ANTIBIOTIC NOTE   Pharmacy has received consult(s) for Vancomycin & Cefepime from an ED provider.  The patient's profile has been reviewed for ht/wt/allergies/indication/available labs.     One time order(s) placed for: Cefepime 2g x1 Vancomycin 2.25g (~22.5 mg/kg TBW)   Further antibiotics/pharmacy consults should be ordered by admitting physician if indicated.                       Thank you, Lorna Dibble 06/01/2021  6:07 PM

## 2021-06-01 NOTE — H&P (Signed)
History and Physical    Nathan Singleton [id number] DOB: 12-13-1941 DOA: 06/01/2021  PCP: Ranae Plumber, PA  Patient coming from: Home, fianc at bedside  I have personally briefly reviewed patient's old medical records in [place]  Chief Complaint: Worsening right second toe wound  HPI: Nathan Singleton is a 80 y.o. male with medical history significant for DM, HTN, PVD, SSS s/p pacemaker, aortic stenosis s/p TAVR, CAD status post CABG, ascending aortic aneurysm 4 cm December 2021, COPD, CKD stage 3a, chronic pain, chronic anemia, bipolar and chronic thrombocytopenia who presents with worsening of chronic right second toe wound.  He has had a chronic wound to his right second toe since about May of this year and has been following with podiatry for frequent debridement. He also was recently admitted on 6/12-6/14 with syncope and LOC. Rt second toe wound at that time was evaluated by Podiatry and stable without infection. However about 2 days ago he began to note increased pain and drainage from the ulcer on the plantar side of the toe.  Yesterday he began to note worsening purulent drainage.  Today when his fiance unwrapped his wound dressing she noticed more blood, significant amount of purulent coming from the ulcer and some new wounds to the dorsal side of the toe.  He denies any fever or chills.  No nausea, vomiting or diarrhea.  States he feels depressed about the prospects of potentially losing another toe but is willing to do what needs to be done.  He previously had his right great toe amputated on 03/20/21 by podiatry.  He also has a previously placed stent to his right popliteal artery.  Last lower extremity angiography on 03/27/2021 by Dr. Lucky Cowboy showed patent stent and normal arteries other than chronically occluded posterior tibial artery.  ED Course: He was afebrile and normotensive on room air.  No leukocytosis.  Anemia stable at baseline with hemoglobin of 9.8.  BG of 171.   Right foot x-ray shows second toe osteomyelitis.  Review of Systems: Constitutional: No Weight Change, No Fever ENT/Mouth: No sore throat, No Rhinorrhea Eyes: No Eye Pain, No Vision Changes Cardiovascular: No Chest Pain, no SOB Respiratory: No Cough, No Sputum, Gastrointestinal: No Nausea, No Vomiting, No Diarrhea, No Constipation, No Pain Genitourinary: no Urinary Incontinence,  Musculoskeletal: No Arthralgias, No Myalgias Skin: + Skin Lesions, No Pruritus, Neuro: no Weakness, No Numbness,  No Loss of Consciousness, No Syncope Psych: No Anxiety/Panic, No Depression, no decrease appetite Heme/Lymph: No Bruising, No Bleeding  Past Medical History:  Diagnosis Date   Aneurysm (arteriovenous) of coronary vessels    Arthritis    Ascending aortic aneurysm (HCC)    CAD (coronary artery disease)    Diabetes mellitus without complication (HCC)    Hard of hearing    History of GI bleed    Hyperlipidemia associated with type 2 diabetes mellitus ([place])    Hypertension associated with diabetes ([place])    Presence of permanent cardiac pacemaker    PVD (peripheral vascular disease) (HCC)    S/P TAVR (transcatheter aortic valve replacement)     Past Surgical History:  Procedure Laterality Date   AMPUTATION TOE Right 03/20/2021   Procedure: AMPUTATION TOE;  Surgeon: Sharlotte Alamo, DPM;  Location: [facility] ORS;  Service: Podiatry;  Laterality: Right;   Ankle repaired Right    Carpal Tunnel repaired Left    CATARACT EXTRACTION Bilateral    COLONOSCOPY     Copillar implant      DG CHOLECYSTOGRAPHY GALL BLADDER (  [place] HX)     GASTRIC BYPASS     heart valve replaced     IRRIGATION AND DEBRIDEMENT FOOT Right 02/18/2021   Procedure: IRRIGATION AND DEBRIDEMENT FOOT-Right Great Toe;  Surgeon: [name], DPM;  Location: [facility] ORS;  Service: Podiatry;  Laterality: Right;   LOWER EXTREMITY ANGIOGRAPHY Right 02/17/2021   Procedure: Lower Extremity Angiography;  Surgeon: Algernon Huxley, MD;  Location: [place] CV LAB;  Service: Cardiovascular;  Laterality: Right;   LOWER EXTREMITY ANGIOGRAPHY Right 03/27/2021   Procedure: Lower Extremity Angiography;  Surgeon: Algernon Huxley, MD;  Location: [place] CV LAB;  Service: Cardiovascular;  Laterality: Right;   PACEMAKER GENERATOR CHANGE     REPLACEMENT TOTAL KNEE BILATERAL     RTC     STENT PLACE LEFT URETER ([place] HX) Right    Right Leg   TEE WITHOUT CARDIOVERSION N/A 03/26/2021   Procedure: TRANSESOPHAGEAL ECHOCARDIOGRAM (TEE);  Surgeon: Kate Sable, MD;  Location: [facility] ORS;  Service: Cardiovascular;  Laterality: N/A;   Toe nail removed Bilateral      reports that he has quit smoking. He has never used smokeless tobacco. He reports previous alcohol use. He reports previous drug use. Social History  Allergies  Allergen Reactions   Atorvastatin Other (See Comments)    "bad for kidneys" Cramping    Celecoxib Other (See Comments)    Other reaction(s): Other (See Comments) Kidney Problem Kidney Problem    Dicyclomine Hcl Other (See Comments)    Stomach cramps   Doxycycline Other (See Comments)    Per wife (per RN)- pt gets severe stomach pains even when takes with food and PCP said to not take     Metformin Other (See Comments)    Family History  Problem Relation Age of Onset   Heart disease Mother    Alzheimer's disease Mother    Heart disease Father    Diabetes Father      Prior to Admission medications   Medication Sig Start Date End Date Taking? Authorizing Provider  acetaminophen (TYLENOL) 325 MG tablet Take 2 tablets (650 mg total) by mouth every 6 (six) hours as needed for fever, headache or moderate pain. 05/09/21   Loletha Grayer, MD  albuterol (VENTOLIN HFA) 108 (90 Base) MCG/ACT inhaler Inhale 2 puffs into the lungs every 4 (four) hours as needed for wheezing or shortness of breath. 05/09/21   Loletha Grayer, MD  allopurinol (ZYLOPRIM) 100 MG tablet Take 100 mg by mouth daily. 10/19/20   [provider]  amLODipine (NORVASC) 10 MG tablet Take 1 tablet (10 mg total) by mouth daily. 05/28/21 06/27/21  Nolberto Hanlon, MD  BAYER ASPIRIN EC LOW DOSE 81 MG EC tablet Take 81 mg by mouth daily. 11/11/20   [provider]  carvedilol (COREG) 6.25 MG tablet Take 6.25 mg by mouth at bedtime. 03/11/21   [provider]  clopidogrel (PLAVIX) 75 MG tablet Take 1 tablet (75 mg total) by mouth daily. 02/22/21   Charlynne Cousins, MD  D-5000 125 MCG (5000 UT) TABS Take 1 tablet by mouth daily. 11/11/20   [provider]  gabapentin (NEURONTIN) 100 MG capsule Take 100 mg by mouth daily. 01/29/21   [provider]  insulin glargine (LANTUS) 100 UNIT/ML Solostar Pen Inject 18 Units into the skin at bedtime. 05/09/21   Loletha Grayer, MD  isosorbide mononitrate (IMDUR) 30 MG 24 hr tablet Take 30 mg by mouth daily. 02/12/21   [provider]  loperamide (IMODIUM)  2 MG capsule Take 1 capsule (2 mg total) by mouth as needed for diarrhea or loose stools. 03/31/21   Blake Divine, MD  multivitamin (ONE-A-DAY MEN'S) TABS tablet Take 1 tablet by mouth daily. 11/11/20   [provider]  pantoprazole (PROTONIX) 40 MG tablet Take 1 tablet (40 mg total) by mouth daily. 02/11/21   [name], MD  rosuvastatin (CRESTOR) 40 MG tablet Take 40 mg by mouth daily. 02/13/21   [provider]  sertraline (ZOLOFT) 100 MG tablet Take 200 mg by mouth daily. 11/11/20   [provider]    Physical Exam: Vitals:   06/01/21 1441 06/01/21 1453 06/01/21 1827 06/01/21 1900  BP:  129/64 (!) 112/96 (!) 131/103  Pulse:  69 68 68  Resp:  18 18   Temp:  98.4 F (36.9 C)    TempSrc:  Oral    SpO2:  100% 100% 99%  Weight: 100 kg     Height: '5\' 8"'$  (1.727 m)       Constitutional: NAD, calm, comfortable, elderly gentleman laying upright in bed eating [place] Vitals:   06/01/21 1441 06/01/21 1453 06/01/21 1827 06/01/21 1900  BP:  129/64 (!) 112/96 (!) 131/103   Pulse:  69 68 68  Resp:  18 18   Temp:  98.4 F (36.9 C)    TempSrc:  Oral    SpO2:  100% 100% 99%  Weight: 100 kg     Height: '5\' 8"'$  (1.727 m)      Eyes: PERRL, lids and conjunctivae normal ENMT: Mucous membranes are moist.  Neck: normal, supple Respiratory: clear to auscultation bilaterally, no wheezing, no crackles. Normal respiratory effort. No accessory muscle use.  Cardiovascular: Regular rate and rhythm, no murmurs / rubs / gallops. No extremity edema. 2+ pedal pulses.  Abdomen: no tenderness, no masses palpated. Musculoskeletal: no clubbing / cyanosis.  Right great toe amputation. Good ROM, no contractures. Normal muscle tone.  Skin: Purulent wound to the dorsal surface of the second right toe with spreading erythema to 3rd-5th toes and up dorsal foot.  Nonhealing ulcer on right plantar surface of second toe.     Neurologic: CN 2-12 grossly intact. Sensation intact, . Strength 5/5 in all 4.  Psychiatric: Normal judgment and insight. Alert and oriented x 3. Normal mood.     Labs on Admission: I have personally reviewed following labs and imaging studies  CBC: Recent Labs  Lab 06/01/21 1452  WBC 5.6  NEUTROABS 4.4  HGB 9.8*  HCT 29.8*  MCV 91.4  PLT 123456*   Basic Metabolic Panel: Recent Labs  Lab 05/26/21 0616 06/01/21 1452  NA 137 134*  K 4.2 4.1  CL 106 107  CO2 26 22  GLUCOSE 136* 171*  BUN 35* 35*  CREATININE 1.23 1.17  CALCIUM 8.5* 8.5*   GFR: Estimated Creatinine Clearance: 59.6 mL/min (by C-G formula based on SCr of 1.17 mg/dL). Liver Function Tests: Recent Labs  Lab 06/01/21 1452  AST 36  ALT 28  ALKPHOS 83  BILITOT 0.6  PROT 7.8  ALBUMIN 3.7   No results for input(s): LIPASE, AMYLASE in the last 168 hours. No results for input(s): AMMONIA in the last 168 hours. Coagulation Profile: No results for input(s): INR, PROTIME in the last 168 hours. Cardiac Enzymes: No results for input(s): CKTOTAL, CKMB, CKMBINDEX, TROPONINI in the last  168 hours. BNP (last 3 results) No results for input(s): PROBNP in the last 8760 hours. HbA1C: No results for input(s): HGBA1C in the last  72 hours. CBG: Recent Labs  Lab 05/26/21 0747 05/26/21 1109 05/26/21 1645 05/27/21 0502 05/27/21 0733  GLUCAP 142* 218* 142* 207* 191*   Lipid Profile: No results for input(s): CHOL, HDL, LDLCALC, TRIG, CHOLHDL, LDLDIRECT in the last 72 hours. Thyroid Function Tests: No results for input(s): TSH, T4TOTAL, FREET4, T3FREE, THYROIDAB in the last 72 hours. Anemia Panel: No results for input(s): VITAMINB12, FOLATE, FERRITIN, TIBC, IRON, RETICCTPCT in the last 72 hours. Urine analysis:    Component Value Date/Time   COLORURINE YELLOW (A) 06/01/2021 1452   APPEARANCEUR CLEAR (A) 06/01/2021 1452   LABSPEC 1.010 06/01/2021 1452   PHURINE 5.0 06/01/2021 1452   GLUCOSEU 150 (A) 06/01/2021 1452   HGBUR NEGATIVE 06/01/2021 1452   BILIRUBINUR NEGATIVE 06/01/2021 1452   KETONESUR NEGATIVE 06/01/2021 1452   PROTEINUR 100 (A) 06/01/2021 1452   NITRITE NEGATIVE 06/01/2021 1452   LEUKOCYTESUR NEGATIVE 06/01/2021 1452    Radiological Exams on Admission: DG Foot Complete Right  Result Date: 06/01/2021 CLINICAL DATA:  Right foot infection EXAM: RIGHT FOOT COMPLETE - 3+ VIEW COMPARISON:  05/07/2021 FINDINGS: New erosive changes of the distal tuft of the right second toe distal phalanx compatible with acute osteomyelitis. Diffuse soft tissue swelling of the second toe including a few tiny foci of soft tissue air at the level of the distal tuft, which may reflect ulceration or wound. No soft tissue gas is seen tracking proximally within the foot. 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 No fracture or dislocation. No additional sites of bone destruction. Mild diffuse soft tissue swelling of the foot. Advanced vascular calcifications. IMPRESSION: 1. Acute osteomyelitis of the distal phalanx of the right second toe. 2. Diffuse soft tissue swelling of the  second toe including a few tiny foci of soft tissue air at the level of the distal tuft, which may reflect ulceration or wound. Electronically Signed   By: Davina Poke D.O.   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 -start on IV Vanc and Cefepime in ED. Continue IV Vanc and switch to IV Rocephin. No growth in previous cultures from right great toe amputation.  - consult Podiatry for evaluation tomorrow. Follows with Dr. Cleda Mccreedy   Hypertension -Continue amlodipine Continue to hold Lasix for hospitalization last week due to syncope from hypotension   Chronic cardiovascular conditions S/pTAVR/PVD s/p stents/CAD s/p CABG/AAA ascending aorta 4 cm 11/2021/pacemaker Continue statins Continue Coreg, Imdur and amlodipine Hold Plavix pending debridement or possible amputation tomorrow pending Podiatry evaluation  Type 2 diabetes mellitus with hyperlipidemia (HCC) Usually on Insulin Glargine 18 Units Subcutaneous Daily at bedtime -start with moderate SSI here   COPD (chronic obstructive pulmonary disease) (HCC) - Stable without acute exacerbation - Continue home inhalers    Chronic pain Continue gabapentin  Depression - Continue sertraline-pt says that he was taken off of this during last admission but no documentation of this   Gout -continue allopurinol   DVT prophylaxis:.SCDs Code Status: Full Family Communication: Plan discussed with patient and fianc at bedside  disposition Plan: Home with at least 2 midnight stays  Consults called:  Admission status: inpatient  Level of care: Med-Surg  Status is: Inpatient  Remains inpatient appropriate because:Inpatient level of care appropriate due to severity of illness  Dispo: The patient is from:  Home               Anticipated d/c is to: Home              Patient currently is not medically stable to d/c.   Difficult to place patient No         Orene Desanctis DO [facility]   If 7PM-7AM, please contact night-coverage [email or web address]   06/01/2021, 7:54 PM

## 2021-06-01 NOTE — ED Provider Notes (Signed)
Emergency Medicine Provider Triage Evaluation Note  Skylur Bedner , a 80 y.o. male  was evaluated in triage.  Pt complains of right foot increasing pain and bleeding. He notes hx of 1st toe amputation in April 2022, reports recent increase in foot pain and bleeding from 2nd digit. Reports he is on a blood thinner. Denies any fevers, chills, or shortness of breath.  Review of Systems  Positive: Foot pain, swelling Negative: Fevers, shortness of breath, chest pain  Physical Exam  Ht '5\' 8"'$  (1.727 m)   Wt 100 kg   BMI 33.52 kg/m  Gen:   Awake, no distress  Resp:  Normal effort  MSK:   Right 1st amputation noted with healed surgical incision. There is erythema of the 2-4th toes extending up the dorsum of the midfoot with active bleeding at the end of the 2nd digit.  Other:    Medical Decision Making  Medically screening exam initiated at 2:49 PM.  Appropriate orders placed.  Tahjae Ruppel was informed that the remainder of the evaluation will be completed by another provider, this initial triage assessment does not replace that evaluation, and the importance of remaining in the ED until their evaluation is complete.  Will initiate infection labs and xray of the right foot.    Marlana Salvage, PA 06/01/21 1453    Blake Divine, MD 06/01/21 [phone]

## 2021-06-01 NOTE — ED Notes (Signed)
Pt given a warm blanket 

## 2021-06-01 NOTE — Consult Note (Signed)
Pharmacy Antibiotic Note  Nathan Singleton is a 81 y.o. male admitted on 06/01/2021 with worsening of chronic right second toe wound. Xray of right foot showed acute osteomyelitis of the distal phalanx of the right second toe. Pharmacy has been consulted for vancomycin dosing.  Plan: Vancomycin 2250 mg IV loading dose, followed by vancomycin 1250 mg IV q24h Est AUC: 541.3 Est Cmax: 37.3 Est Cmin: 13.2 Calculated with Scr 1.17 and Vd 0.5  Patient is also on ceftriaxone 2 g IV q24h  Monitor clinical picture, renal function, and check vancomycin levels prior to the 4th or 5th dose F/U C&S, abx deescalation / LOT   Height: '5\' 8"'$  (172.7 cm) Weight: 100 kg (220 lb 7.4 oz) IBW/kg (Calculated) : 68.4  Temp (24hrs), Avg:98.4 F (36.9 C), Min:98.4 F (36.9 C), Max:98.4 F (36.9 C)  Recent Labs  Lab 05/26/21 0616 06/01/21 1452  WBC  --  5.6  CREATININE 1.23 1.17  LATICACIDVEN  --  0.8    Estimated Creatinine Clearance: 59.6 mL/min (by C-G formula based on SCr of 1.17 mg/dL).    Allergies  Allergen Reactions   Atorvastatin Other (See Comments)    "bad for kidneys" Cramping    Celecoxib Other (See Comments)    Other reaction(s): Other (See Comments) Kidney Problem Kidney Problem    Dicyclomine Hcl Other (See Comments)    Stomach cramps   Doxycycline Other (See Comments)    Per wife (per RN)- pt gets severe stomach pains even when takes with food and PCP said to not take     Metformin Other (See Comments)    Antimicrobials this admission: 6/19 cefepime x1 in the ED 6/19 ceftriaxone >> 6/19 vancomycin >>   Dose adjustments this admission: N/A  Microbiology results: 6/19 BCx: pending  Thank you for allowing pharmacy to be a part of this patient's care.  Darnelle Bos, PharmD 06/01/2021 7:50 PM

## 2021-06-01 NOTE — ED Provider Notes (Signed)
[facility] Emergency Department Provider Note   ____________________________________________   Event Date/Time   First MD Initiated Contact with Patient 06/01/21 1902     (approximate)  I have reviewed the triage vital signs and the nursing notes.   HISTORY  Chief Complaint Foot Pain    HPI Nathan Singleton is a 80 y.o. male history of diabetes, previous right first toe amputation coronary disease  Patient reports about 2 days ago he and his fiance noticed his right foot 2nd toe was getting swollen.   His swelling is increased and he is noticed increasing swelling and pain particularly in the second toe of his right foot.  He is concerned he has an infection in the bone which is happened before  No fevers or chills.  He is felt a little fatigued today.  No nausea or vomiting.  No abdominal pain.  The area has started to look red and swollen over his right foot as well    Past Medical History:  Diagnosis Date   Aneurysm (arteriovenous) of coronary vessels    Arthritis    Ascending aortic aneurysm (HCC)    CAD (coronary artery disease)    Diabetes mellitus without complication (HCC)    Hard of hearing    History of GI bleed    Hyperlipidemia associated with type 2 diabetes mellitus ([place])    Hypertension associated with diabetes ([place])    Presence of permanent cardiac pacemaker    PVD (peripheral vascular disease) ([place])    S/P TAVR (transcatheter aortic valve replacement)     Patient Active Problem List   Diagnosis Date Noted   Osteomyelitis ([place]) 06/01/2021   Syncope and collapse 05/25/2021   Spondylosis without myelopathy or radiculopathy, lumbosacral region 05/12/2021   Orthostatic hypotension    Weakness    CAP (community acquired pneumonia) 05/06/2021   Syncope, vasovagal 05/06/2021   CKD (chronic kidney disease), stage IIIa 05/06/2021   Diarrhea 05/06/2021   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-S1 level of lumbosacral spine 04/22/2021   Lumbar facet joint syndrome (Bilateral) (L>R) 04/22/2021   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HCC)    MSSA bacteremia    Anemia of chronic disease    Cellulitis of second toe of right foot 03/20/2021   Type 2 diabetes mellitus with hyperlipidemia ([place]) 03/20/2021   CKD stage 3 due to type 2 diabetes mellitus ([place]) 03/20/2021   Dizziness 03/20/2021   Chronic pain syndrome 03/03/2021   Pharmacologic therapy 03/03/2021   Disorder of skeletal system 03/03/2021   Problems influencing health status [id number]   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[place]) 03/03/2021   Chronic lower extremity pain (2ry area of Pain) (Bilateral) (R>L) 03/03/2021   Chronic anticoagulation (Plavix) 03/03/2021   Chronic shoulder pain (3ry area of Pain) (Left) 03/03/2021   History of total knee replacement (Bilateral) 03/03/2021   DDD (degenerative disc disease), thoracic 03/03/2021   Cervical facet hypertrophy (Multilevel) (Bilateral) 03/03/2021   DDD (degenerative disc disease), cervical 03/03/2021   BPH (benign prostatic hyperplasia) 02/14/2021   Diabetic foot ulcer ([place]) 02/13/2021   Normocytic anemia 11/26/2020   Chronic kidney disease (CKD) 11/26/2020   Claudication ([place]) 11/26/2020   Contusion of right thigh 11/26/2020   COPD (chronic obstructive pulmonary disease) ([place]) 11/26/2020   Depression 11/26/2020   Dyspnea 11/26/2020   Erectile dysfunction 11/26/2020   GERD without esophagitis 11/26/2020   Gout 11/26/2020   Hyperlipidemia, acquired 11/26/2020   Hyperlipidemia 11/26/2020   Myocardial  infarction ([place]) 11/26/2020   Personal history of other diseases of urinary system 11/26/2020   Right leg swelling 11/26/2020   Sleep apnea 11/26/2020   Varicose veins 11/26/2020   Venous insufficiency 11/26/2020   Vitamin D deficiency 11/26/2020   Chest pain 11/21/2020   Thrombocytopenia ([place]) 11/21/2020   S/P TAVR (transcatheter aortic valve replacement)    PVD  (peripheral vascular disease) ([place])    Presence of permanent cardiac pacemaker    Hypertension associated with diabetes ([place])    Hyperlipidemia associated with type 2 diabetes mellitus ([place])    History of GI bleed    CAD (coronary artery disease)    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 11/19/2020   Pacemaker St Jude device 11/19/2020   Peripheral vascular disease, unspecified ([place]) stents to popliteal arteries many years ago 11/19/2020   History of gingival bleeding 11/19/2020   Coronary artery disease involving coronary bypass graft of native heart with angina pectoris ([place]) 11/19/2020   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 11/19/2020   Hypertension    DM2 (diabetes mellitus, type 2) ([place])    Arthritis    Closed nondisplaced fracture of greater trochanter of right femur ([place]) 01/25/2020   DDD (degenerative disc disease), lumbar 12/10/2019   Chronic low back pain (1ry area of Pain) (Bilateral) (L>R) w/o sciatica 12/09/2019   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   Urinary retention 12/09/2019   Hearing loss 05/11/2019   Obesity 04/07/2019   Retinopathy 04/07/2019   Deafness, left 01/12/2019   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/12/2019   Bipolar disorder ([place]) 01/01/2019   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   Heart block 04/25/2018   Calculus of gallbladder with acute cholecystitis without obstruction 09/21/2017   Gastrojejunal ulcer 03/23/2014   Insomnia 03/22/2014   Blisters of multiple sites 01/18/2014   Cellulitis 01/18/2014   OA (osteoarthritis) of knee 06/09/2013   Status post percutaneous transluminal coronary angioplasty 01/14/2011    Past Surgical History:  Procedure Laterality Date   AMPUTATION TOE Right 03/20/2021   Procedure: AMPUTATION TOE;  Surgeon: Sharlotte Alamo, DPM;  Location: [facility] ORS;  Service: Podiatry;  Laterality: Right;   Ankle repaired Right    Carpal Tunnel repaired Left    CATARACT  EXTRACTION Bilateral    COLONOSCOPY     Copillar implant      DG CHOLECYSTOGRAPHY GALL BLADDER ([facility] HX)     GASTRIC BYPASS     heart valve replaced     IRRIGATION AND DEBRIDEMENT FOOT Right 02/18/2021   Procedure: IRRIGATION AND DEBRIDEMENT FOOT-Right Great Toe;  Surgeon: [name], DPM;  Location: [facility] ORS;  Service: Podiatry;  Laterality: Right;   LOWER EXTREMITY ANGIOGRAPHY Right 02/17/2021   Procedure: Lower Extremity Angiography;  Surgeon: Algernon Huxley, MD;  Location: [facility];  Service: Cardiovascular;  Laterality: Right;   LOWER EXTREMITY ANGIOGRAPHY Right 03/27/2021   Procedure: Lower Extremity Angiography;  Surgeon: Algernon Huxley, MD;  Location: [facility];  Service: Cardiovascular;  Laterality: Right;   PACEMAKER GENERATOR CHANGE     REPLACEMENT TOTAL KNEE BILATERAL     RTC     STENT PLACE LEFT URETER ([place] HX) Right    Right Leg   TEE WITHOUT CARDIOVERSION N/A 03/26/2021   Procedure: TRANSESOPHAGEAL ECHOCARDIOGRAM (TEE);  Surgeon: Kate Sable, MD;  Location: [facility] OR[facility];  Service: Cardiovascular;  Laterality: N/A;   Toe nail removed Bilateral     Prior to Admission medications   Medication  Sig Start Date End Date Taking? Authorizing Provider  acetaminophen (TYLENOL) 325 MG tablet Take 2 tablets (650 mg total) by mouth every 6 (six) hours as needed for fever, headache or moderate pain. 05/09/21   Loletha Grayer, MD  albuterol (VENTOLIN HFA) 108 (90 Base) MCG/ACT inhaler Inhale 2 puffs into the lungs every 4 (four) hours as needed for wheezing or shortness of breath. 05/09/21   Loletha Grayer, MD  allopurinol (ZYLOPRIM) 100 MG tablet Take 100 mg by mouth daily. 10/19/20   [provider]  amLODipine (NORVASC) 10 MG tablet Take 1 tablet (10 mg total) by mouth daily. 05/28/21 06/27/21  Nolberto Hanlon, MD  BAYER ASPIRIN EC LOW DOSE 81 MG EC tablet Take 81 mg by mouth daily. 11/11/20   [provider]  carvedilol (COREG) 6.25 MG tablet Take  6.25 mg by mouth at bedtime. 03/11/21   [provider]  clopidogrel (PLAVIX) 75 MG tablet Take 1 tablet (75 mg total) by mouth daily. 02/22/21   Charlynne Cousins, MD  D-5000 125 MCG (5000 UT) TABS Take 1 tablet by mouth daily. 11/11/20   [provider]  gabapentin (NEURONTIN) 100 MG capsule Take 100 mg by mouth daily. 01/29/21   [provider]  insulin glargine (LANTUS) 100 UNIT/ML Solostar Pen Inject 18 Units into the skin at bedtime. 05/09/21   Loletha Grayer, MD  isosorbide mononitrate (IMDUR) 30 MG 24 hr tablet Take 30 mg by mouth daily. 02/12/21   [provider]  loperamide (IMODIUM) 2 MG capsule Take 1 capsule (2 mg total) by mouth as needed for diarrhea or loose stools. 03/31/21   Blake Divine, MD  multivitamin (ONE-A-DAY MEN'S) TABS tablet Take 1 tablet by mouth daily. 11/11/20   [provider]  pantoprazole (PROTONIX) 40 MG tablet Take 1 tablet (40 mg total) by mouth daily. 02/11/21   [name], MD  rosuvastatin (CRESTOR) 40 MG tablet Take 40 mg by mouth daily. 02/13/21   [provider]  sertraline (ZOLOFT) 100 MG tablet Take 200 mg by mouth daily. 11/11/20   [provider]    Allergies Atorvastatin, Celecoxib, Dicyclomine hcl, Doxycycline, and Metformin  Family History  Problem Relation Age of Onset   Heart disease Mother    Alzheimer's disease Mother    Heart disease Father    Diabetes Father     Social History Social History   Tobacco Use   Smoking status: Former    Pack years: 0.00   Smokeless tobacco: Never  Substance Use Topics   Alcohol use: Not Currently   Drug use: Not Currently    Review of Systems Constitutional: No fever/chills Eyes: No visual changes. Cardiovascular: Denies chest pain. Respiratory: Denies shortness of breath. Gastrointestinal: No abdominal pain.   Genitourinary: Negative for dysuria. Musculoskeletal: See HPI.  No issues with left foot Skin: Negative for rash except  redness noted in the right foot. Neurological: Negative for headaches, areas of focal weakness or numbness.    ____________________________________________   PHYSICAL EXAM:  VITAL SIGNS: ED Triage Vitals  Enc Vitals Group     BP 06/01/21 1453 129/64     Pulse Rate 06/01/21 1453 69     Resp 06/01/21 1453 18     Temp 06/01/21 1453 98.4 F (36.9 C)     Temp Source 06/01/21 1453 Oral     SpO2 06/01/21 1453 100 %     Weight 06/01/21 1441 220 lb 7.4 oz (100 kg)     Height 06/01/21 1441 5'  8" (1.727 m)     Head Circumference --      Peak Flow --      Pain Score 06/01/21 1441 6     Pain Loc --      Pain Edu? --      Excl. in [place]? --     Constitutional: Alert and oriented. Well appearing and in no acute distress. Eyes: Conjunctivae are normal. Head: Atraumatic. Nose: No congestion/rhinnorhea. Mouth/Throat: Mucous membranes are moist. Neck: No stridor.  Cardiovascular: Normal rate, regular rhythm. Grossly normal heart sounds.  Good peripheral circulation.  Dopplerable dorsalis pedis and posterior tibial pulses bilaterally.  Slowed capillary refill about 1 second in edematous appearing second third and fourth right toes Respiratory: Normal respiratory effort.  No retractions. Lungs CTAB. Gastrointestinal: Soft and nontender. No distention. Musculoskeletal: No lower extremity tenderness nor edema except right foot from about the midfoot down he does have erythema and some edema, also some tenderness without obvious gangrenous appearance to the second through fourth toes, the second toe is fairly tender to touch, and circumferentially swollen.  No crepitance or signs of necrotizing process at this time Neurologic:  Normal speech and language. No gross focal neurologic deficits are appreciated.  Skin:  Skin is warm, dry and intact. No rash noted except right foot. Psychiatric: Mood and affect are normal. Speech and behavior are normal.  ____________________________________________    LABS (all labs ordered are listed, but only abnormal results are displayed)  Labs Reviewed  COMPREHENSIVE METABOLIC PANEL - Abnormal; Notable for the following components:      Result Value   Sodium 134 (*)    Glucose, Bld 171 (*)    BUN 35 (*)    Calcium 8.5 (*)    All other components within normal limits  CBC WITH DIFFERENTIAL/PLATELET - Abnormal; Notable for the following components:   RBC 3.26 (*)    Hemoglobin 9.8 (*)    HCT 29.8 (*)    Platelets 119 (*)    Lymphs Abs 0.6 (*)    All other components within normal limits  URINALYSIS, COMPLETE (UACMP) WITH MICROSCOPIC - Abnormal; Notable for the following components:   Color, Urine YELLOW (*)    APPearance CLEAR (*)    Glucose, UA 150 (*)    Protein, ur 100 (*)    All other components within normal limits  RESP PANEL BY RT-PCR (FLU A&B, COVID) ARPGX2  CULTURE, BLOOD (ROUTINE X 2)  CULTURE, BLOOD (ROUTINE X 2)  LACTIC ACID, PLASMA  BASIC METABOLIC PANEL   ____________________________________________  EKG   ____________________________________________  RADIOLOGY  DG Foot Complete Right  Result Date: 06/01/2021 CLINICAL DATA:  Right foot infection EXAM: RIGHT FOOT COMPLETE - 3+ VIEW COMPARISON:  05/07/2021 FINDINGS: New erosive changes of the distal tuft of the right second toe distal phalanx compatible with acute osteomyelitis. Diffuse soft tissue swelling of the second toe including a few tiny foci of soft tissue air at the level of the distal tuft, which may reflect ulceration or wound. No soft tissue gas is seen tracking proximally within the foot. 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 No fracture or dislocation. No additional sites of bone destruction. Mild diffuse soft tissue swelling of the foot. Advanced vascular calcifications. IMPRESSION: 1. Acute osteomyelitis of the distal phalanx of the right second toe. 2. Diffuse soft tissue swelling of the second toe including a few tiny foci of soft tissue air  at the level of the distal tuft, which may reflect ulceration  or wound. Electronically Signed   By: Davina Poke D.O.   On: 06/01/2021 15:36    Imaging reviewed, concerning for osteomyelitis distal phalanx of right second toe.  Report as above.  And do not see evidence of acute gas formation. ____________________________________________   PROCEDURES  Procedure(s) performed: None  Procedures  Critical Care performed: No  ____________________________________________   INITIAL IMPRESSION / ASSESSMENT AND PLAN / ED COURSE  Pertinent labs & imaging results that were available during my care of the patient were reviewed by me and considered in my medical decision making (see chart for details).   Concern for cellulitis involving the right foot with concern particularly along the second right toe for osteomyelitis.  His clinical exam and history seem consistent with osteomyelitis.  Start broad-spectrum antibiotic.  Patient understand agreeable with plan for admission, pain improved after morphine administration.  Patient is fianc as well as his daughter who is a spoke to on the phone with patient's permission all understanding agreeable with plan for admission.  We would certainly anticipate a podiatry consult tomorrow.  Admission discussed with Dr. Flossie Buffy      ____________________________________________   FINAL CLINICAL IMPRESSION(S) / ED DIAGNOSES  Final diagnoses:  Osteomyelitis of second toe of right foot ([place])  Cellulitis of right foot        Note:  This document was prepared using Dragon voice recognition software and may include unintentional dictation errors       Delman Kitten, MD 06/01/21 1946

## 2021-06-02 ENCOUNTER — Encounter: Payer: Self-pay | Admitting: Family Medicine

## 2021-06-02 ENCOUNTER — Telehealth: Payer: Self-pay | Admitting: Gastroenterology

## 2021-06-02 DIAGNOSIS — B9561 Methicillin susceptible Staphylococcus aureus infection as the cause of diseases classified elsewhere: Secondary | ICD-10-CM

## 2021-06-02 DIAGNOSIS — R7881 Bacteremia: Secondary | ICD-10-CM

## 2021-06-02 LAB — BASIC METABOLIC PANEL
Anion gap: 8 (ref 5–15)
BUN: 37 mg/dL — ABNORMAL HIGH (ref 8–23)
CO2: 23 mmol/L (ref 22–32)
Calcium: 8.6 mg/dL — ABNORMAL LOW (ref 8.9–10.3)
Chloride: 106 mmol/L (ref 98–111)
Creatinine, Ser: 1.21 mg/dL (ref 0.61–1.24)
GFR, Estimated: >60 mL/min (ref >60)
Glucose, Bld: 219 mg/dL — ABNORMAL HIGH (ref 70–99)
Potassium: 4.9 mmol/L (ref 3.5–5.1)
Sodium: 137 mmol/L (ref 135–145)

## 2021-06-02 LAB — BLOOD CULTURE ID PANEL (REFLEXED) - BCID2

## 2021-06-02 LAB — CBG MONITORING, ED
Glucose-Capillary: 171 mg/dL — ABNORMAL HIGH (ref 70–99)
Glucose-Capillary: 209 mg/dL — ABNORMAL HIGH (ref 70–99)
Glucose-Capillary: 246 mg/dL — ABNORMAL HIGH (ref 70–99)
Glucose-Capillary: 269 mg/dL — ABNORMAL HIGH (ref 70–99)

## 2021-06-02 LAB — GLUCOSE, CAPILLARY: Glucose-Capillary: 224 mg/dL — ABNORMAL HIGH (ref 70–99)

## 2021-06-02 MED ORDER — MORPHINE SULFATE (PF) 2 MG/ML IV SOLN
2.0000 mg | INTRAVENOUS | Status: DC | PRN
  Administered 2021-06-02 – 2021-06-03 (×3): 2 mg via INTRAVENOUS
  Filled 2021-06-02 (×3): qty 1

## 2021-06-02 MED ORDER — CEFAZOLIN SODIUM-DEXTROSE 2-4 GM/100ML-% IV SOLN
2.0000 g | Freq: Three times a day (TID) | INTRAVENOUS | Status: DC
  Administered 2021-06-02 – 2021-06-06 (×12): 2 g via INTRAVENOUS
  Filled 2021-06-02 (×13): qty 100

## 2021-06-02 MED ORDER — CHLORHEXIDINE GLUCONATE 4 % EX LIQD: 60.0000 mL | Freq: Once | CUTANEOUS | Status: DC

## 2021-06-02 MED ORDER — MORPHINE SULFATE (PF) 4 MG/ML IV SOLN
4.0000 mg | INTRAVENOUS | Status: DC | PRN
  Administered 2021-06-02: 4 mg via INTRAVENOUS

## 2021-06-02 MED ORDER — CARVEDILOL 3.125 MG PO TABS
3.1250 mg | ORAL_TABLET | Freq: Two times a day (BID) | ORAL | Status: DC
  Administered 2021-06-02 – 2021-06-06 (×7): 3.125 mg via ORAL
  Filled 2021-06-02 (×8): qty 1

## 2021-06-02 MED ORDER — MORPHINE SULFATE (PF) 4 MG/ML IV SOLN
INTRAVENOUS | Status: AC
  Filled 2021-06-02: qty 1

## 2021-06-02 MED ORDER — ALBUTEROL SULFATE (2.5 MG/3ML) 0.083% IN NEBU: 2.5000 mg | INHALATION_SOLUTION | RESPIRATORY_TRACT | Status: DC | PRN

## 2021-06-02 MED ORDER — ENOXAPARIN SODIUM 60 MG/0.6ML IJ SOSY
0.5000 mg/kg | PREFILLED_SYRINGE | INTRAMUSCULAR | Status: DC
  Administered 2021-06-02 – 2021-06-05 (×4): 50 mg via SUBCUTANEOUS
  Filled 2021-06-02 (×4): qty 0.6

## 2021-06-02 MED ORDER — INSULIN GLARGINE 100 UNIT/ML ~~LOC~~ SOLN
10.0000 [IU] | Freq: Every day | SUBCUTANEOUS | Status: DC
  Administered 2021-06-02 – 2021-06-03 (×2): 10 [IU] via SUBCUTANEOUS
  Filled 2021-06-02 (×3): qty 0.1

## 2021-06-02 NOTE — Telephone Encounter (Signed)
Patient has to cancel his procedure tomorrow 06/21, as he has to have a surgical procedure and have a toe removed.

## 2021-06-02 NOTE — Consult Note (Signed)
NAME: Nathan Singleton  DOB: [birth year]  MRN: [id number]  Date/Time: 06/02/2021 4:19 PM  REQUESTING PROVIDER: Dr. Flossie Buffy Subjective:  REASON FOR CONSULT: Staff aureus bacteremia ? Nathan Singleton is a 80 y.o. male with a history of diabetes mellitus, peripheral neuropathy, CAD, hyperlipidemia,Peripheral vascular disease, status post angioplasty of the right SFA and right dorsalis pedis and distal anterior tibial artery on 02/17/2021, status post TAVR, pacemaker, bilateral knee replacement, excision of head of proximal phalanx right great toe, excision lateral condyle distal phalanx right great toe.  Recent MSSA bacteremia and osteomyelitis April 2022 and treated with 6 weeks of IV antibiotics followed by 2 weeks of p.o. Keflex. Multiple hospitalizations recently. Presents to the ED with complaints of right foot pain and bleeding on 06/01/2021.  Vitals in the ED BP 120/45, temp 98.4, pulse 68, sats 99%.  Labs revealed WBC 5.6, Hb 9.8, platelet 119 and creatinine 1.17.  Blood culture sent.  He was started on Vanco and cefepime.  3/3 to 02/21/2021: Surgery right great toe and put on Keflex.  No osteomyelitis Readmitted for 03/20/21 until 03/28/2021 and had amputation of the right great toe at the metatarsal phalangeal joint.  MSSA bacteremia and treated with IV cefazolin until 04/19/2021 followed by 2 weeks of p.o. Keflex. TEE was negative during that admission  05/06/2021 until 05/09/2021 for syncope, second toe cellulitis  05/25/2021 until 05/27/2021.  Admitted with syncope.  Found to have orthostatic hypotension.  His medications including tamsulosin and furosemide were put on hold I am seeing the patient for Staphylococcus aureus bacteremia.  Past Medical History:  Diagnosis Date   Aneurysm (arteriovenous) of coronary vessels    Arthritis    Ascending aortic aneurysm (HCC)    CAD (coronary artery disease)    Diabetes mellitus without complication ([place])    Hard of hearing    History of GI bleed     Hyperlipidemia associated with type 2 diabetes mellitus ([place])    Hypertension associated with diabetes ([place])    Presence of permanent cardiac pacemaker    PVD (peripheral vascular disease) ([place])    S/P TAVR (transcatheter aortic valve replacement)     Past Surgical History:  Procedure Laterality Date   AMPUTATION TOE Right 03/20/2021   Procedure: AMPUTATION TOE;  Surgeon: Sharlotte Alamo, DPM;  Location: [facility] OR[facility];  Service: Podiatry;  Laterality: Right;   Ankle repaired Right    Carpal Tunnel repaired Left    CATARACT EXTRACTION Bilateral    COLONOSCOPY     Copillar implant      DG CHOLECYSTOGRAPHY GALL BLADDER ([facility] HX)     GASTRIC BYPASS     heart valve replaced     IRRIGATION AND DEBRIDEMENT FOOT Right 02/18/2021   Procedure: IRRIGATION AND DEBRIDEMENT FOOT-Right Great Toe;  Surgeon: [name], DPM;  Location: [facility];  Service: Podiatry;  Laterality: Right;   LOWER EXTREMITY ANGIOGRAPHY Right 02/17/2021   Procedure: Lower Extremity Angiography;  Surgeon: Algernon Huxley, MD;  Location: [facility];  Service: Cardiovascular;  Laterality: Right;   LOWER EXTREMITY ANGIOGRAPHY Right 03/27/2021   Procedure: Lower Extremity Angiography;  Surgeon: Algernon Huxley, MD;  Location: [facility];  Service: Cardiovascular;  Laterality: Right;   PACEMAKER GENERATOR CHANGE     REPLACEMENT TOTAL KNEE BILATERAL     RTC     STENT PLACE LEFT URETER ([facility]) Right    Right Leg   TEE WITHOUT CARDIOVERSION N/A 03/26/2021   Procedure: TRANSESOPHAGEAL ECHOCARDIOGRAM (TEE);  Surgeon: Kate Sable, MD;  Location: [facility];  Service: Cardiovascular;  Laterality: N/A;   Toe nail removed Bilateral     Social History   Socioeconomic History   Marital status: Single    Spouse name: Not on file   Number of children: Not on file   Years of education: Not on file   Highest education level: Not on file  Occupational History   Not on file  Tobacco Use   Smoking status: Former    Pack years:  0.00   Smokeless tobacco: Never  Substance and Sexual Activity   Alcohol use: Not Currently   Drug use: Not Currently   Sexual activity: Not on file  Other Topics Concern   Not on file  Social History Narrative   Not on file   Social Determinants of Health   Financial Resource Strain: Not on file  Food Insecurity: Not on file  Transportation Needs: Not on file  Physical Activity: Not on file  Stress: Not on file  Social Connections: Not on file  Intimate Partner Violence: Not on file    Family History  Problem Relation Age of Onset   Heart disease Mother    Alzheimer's disease Mother    Heart disease Father    Diabetes Father    Allergies  Allergen Reactions   Atorvastatin Other (See Comments)    "bad for kidneys" Cramping    Celecoxib Other (See Comments)    Other reaction(s): Other (See Comments) Kidney Problem Kidney Problem    Dicyclomine Hcl Other (See Comments)    Stomach cramps   Doxycycline Other (See Comments)    Per wife (per RN)- pt gets severe stomach pains even when takes with food and PCP said to not take     Metformin Other (See Comments)   I? Current Facility-Administered Medications  Medication Dose Route Frequency Provider Last Rate Last Admin   acetaminophen (TYLENOL) tablet 650 mg  650 mg Oral Q6H PRN Tu, Ching T, DO       albuterol (PROVENTIL) (2.5 MG/3ML) 0.083% nebulizer solution 2.5 mg  2.5 mg Nebulization Q4H PRN Renda Rolls, RPH       allopurinol (ZYLOPRIM) tablet 100 mg  100 mg Oral Daily Tu, Ching T, DO   100 mg at 06/02/21 1006   amLODipine (NORVASC) tablet 10 mg  10 mg Oral Daily Tu, Ching T, DO   10 mg at 06/02/21 1006   aspirin EC tablet 81 mg  81 mg Oral Daily Tu, Ching T, DO   81 mg at 06/02/21 1005   carvedilol (COREG) tablet 3.125 mg  3.125 mg Oral BID Elgergawy, Silver Huguenin, MD   3.125 mg at 06/02/21 1011   ceFAZolin (ANCEF) IVPB 2g/100 mL premix  2 g Intravenous Q8H Kellianne Ek, Joellyn Quails, MD       gabapentin (NEURONTIN)  capsule 100 mg  100 mg Oral Daily Tu, Ching T, DO   100 mg at 06/02/21 1005   insulin aspart (novoLOG) injection 0-15 Units  0-15 Units Subcutaneous TID PC,HS,0200 Tu, Ching T, DO   5 Units at 06/02/21 1311   insulin glargine (LANTUS) injection 10 Units  10 Units Subcutaneous QHS Elgergawy, Silver Huguenin, MD       isosorbide mononitrate (IMDUR) 24 hr tablet 30 mg  30 mg Oral Daily Tu, Ching T, DO   30 mg at 06/02/21 1005   morphine 2 MG/ML injection 2 mg  2 mg Intravenous Q4H PRN Elgergawy, Silver Huguenin, MD   2 mg at 06/02/21 1249  pantoprazole (PROTONIX) EC tablet 40 mg  40 mg Oral Daily Tu, Ching T, DO   40 mg at 06/02/21 1004   rosuvastatin (CRESTOR) tablet 40 mg  40 mg Oral Daily Tu, Ching T, DO   40 mg at 06/02/21 1005   sertraline (ZOLOFT) tablet 200 mg  200 mg Oral Daily Tu, Ching T, DO   200 mg at 06/02/21 1005   Current Outpatient Medications  Medication Sig Dispense Refill   allopurinol (ZYLOPRIM) 100 MG tablet Take 100 mg by mouth daily.     amLODipine (NORVASC) 10 MG tablet Take 1 tablet (10 mg total) by mouth daily. 30 tablet 0   BAYER ASPIRIN EC LOW DOSE 81 MG EC tablet Take 81 mg by mouth daily.     carvedilol (COREG) 3.125 MG tablet Take 3.125 mg by mouth 2 (two) times daily.     Cholecalciferol (VITAMIN D3) 125 MCG (5000 UT) CAPS Take 5,000 Units by mouth daily.     clopidogrel (PLAVIX) 75 MG tablet Take 1 tablet (75 mg total) by mouth daily. 60 tablet 3   gabapentin (NEURONTIN) 100 MG capsule Take 100 mg by mouth daily.     isosorbide mononitrate (IMDUR) 30 MG 24 hr tablet Take 30 mg by mouth daily.     multivitamin (ONE-A-DAY MEN'S) TABS tablet Take 1 tablet by mouth daily.     pantoprazole (PROTONIX) 40 MG tablet Take 1 tablet (40 mg total) by mouth daily. 90 tablet 1   sertraline (ZOLOFT) 100 MG tablet Take 200 mg by mouth daily.     acetaminophen (TYLENOL) 325 MG tablet Take 2 tablets (650 mg total) by mouth every 6 (six) hours as needed for fever, headache or moderate pain.      albuterol (VENTOLIN HFA) 108 (90 Base) MCG/ACT inhaler Inhale 2 puffs into the lungs every 4 (four) hours as needed for wheezing or shortness of breath. 18 g 0   Insulin Glargine-yfgn 100 UNIT/ML SOPN Inject 18 Units into the skin at bedtime.     loperamide (IMODIUM) 2 MG capsule Take 1 capsule (2 mg total) by mouth as needed for diarrhea or loose stools. 30 capsule 0   rosuvastatin (CRESTOR) 40 MG tablet Take 40 mg by mouth daily.       Abtx:  Anti-infectives (From admission, onward)    Start     Dose/Rate Route Frequency Ordered Stop   06/02/21 2200  ceFAZolin (ANCEF) IVPB 2g/100 mL premix        2 g 200 mL/hr over 30 Minutes Intravenous Every 8 hours 06/02/21 1554     06/02/21 2100  vancomycin (VANCOREADY) IVPB 1250 mg/250 mL  Status:  Discontinued        1,250 mg 166.7 mL/hr over 90 Minutes Intravenous Every 24 hours 06/01/21 2127 06/02/21 1554   06/02/21 0100  cefTRIAXone (ROCEPHIN) 2 g in sodium chloride 0.9 % 100 mL IVPB  Status:  Discontinued        2 g 200 mL/hr over 30 Minutes Intravenous Every 24 hours 06/01/21 1948 06/02/21 1554   06/01/21 1915  vancomycin (VANCOREADY) IVPB 1250 mg/250 mL       See Hyperspace for full Linked Orders Report.   1,250 mg 166.7 mL/hr over 90 Minutes Intravenous  Once 06/01/21 1813 06/01/21 2110   06/01/21 1815  vancomycin (VANCOCIN) IVPB 1000 mg/200 mL premix       See Hyperspace for full Linked Orders Report.   1,000 mg 200 mL/hr over 60 Minutes Intravenous  Once 06/01/21  1813 06/01/21 2220   06/01/21 1815  ceFEPIme (MAXIPIME) 2 g in sodium chloride 0.9 % 100 mL IVPB        2 g 200 mL/hr over 30 Minutes Intravenous  Once 06/01/21 1813 06/01/21 1929       REVIEW OF SYSTEMS:  Const: negative fever, negative chills, negative weight loss Eyes: negative diplopia or visual changes, negative eye pain ENT: negative coryza, negative sore throat Resp: negative cough, hemoptysis, dyspnea Cards: negative for chest pain, palpitations, lower  extremity edema GU: negative for frequency, dysuria and hematuria GI: Negative for abdominal pain, diarrhea, bleeding, constipation Skin: negative for rash and pruritus Heme: negative for easy bruising and gum/nose bleeding MS:.  Right foot pain Neurolo: Dizziness and syncope diet Psych: negative for feelings of anxiety, depression  Endocrine: Diabetes mellitus Allergy/Immunology-as above.   Objective:  VITALS:  BP 140/63   Pulse (!) 57   Temp 98.9 F (37.2 C) (Oral)   Resp 18   Ht '5\' 8"'$  (1.727 m)   Wt 100 kg   SpO2 100%   BMI 33.52 kg/m  PHYSICAL EXAM:  General: Alert, cooperative, no distress, appears stated age.  Hard of hearing. Head: Normocephalic, without obvious abnormality, atraumatic. Eyes: Conjunctivae clear, anicteric sclerae. Pupils are equal ENT Nares normal. No drainage or sinus tenderness. Lips, mucosa, and tongue normal. No Thrush Neck: Supple, symmetrical, no adenopathy, thyroid: non tender no carotid bruit and no JVD. Back: No CVA tenderness. Lungs: Clear to auscultation bilaterally. No Wheezing or Rhonchi. No rales. Heart: Regular rate and rhythm, no murmur, rub or gallop. Pacemaker site healthy  Abdomen: Soft, non-tender,not distended. Bowel sounds normal. No masses Extremities: Right foot Second great toe has an ulcer on the tip     Skin: No rashes or lesions. Or bruising Lymph: Cervical, supraclavicular normal. Neurologic: Grossly non-focal Pertinent Labs Lab Results CBC    Component Value Date/Time   WBC 5.6 06/01/2021 1452   RBC 3.26 (L) 06/01/2021 1452   HGB 9.8 (L) 06/01/2021 1452   HGB 11.7 (L) 11/19/2020 1439   HCT 29.8 (L) 06/01/2021 1452   HCT 34.3 (L) 11/19/2020 1439   PLT 119 (L) 06/01/2021 1452   PLT 178 03/03/2021 1628   MCV 91.4 06/01/2021 1452   MCV 96 11/19/2020 1439   MCH 30.1 06/01/2021 1452   MCHC 32.9 06/01/2021 1452   RDW 14.4 06/01/2021 1452   RDW 13.1 11/19/2020 1439   LYMPHSABS 0.6 (L) 06/01/2021 1452    MONOABS 0.3 06/01/2021 1452   EOSABS 0.2 06/01/2021 1452   BASOSABS 0.0 06/01/2021 1452    CMP Latest Ref Rng & Units 06/02/2021 06/01/2021 05/26/2021  Glucose 70 - 99 mg/dL 219(H) 171(H) 136(H)  BUN 8 - 23 mg/dL 37(H) 35(H) 35(H)  Creatinine 0.61 - 1.24 mg/dL 1.21 1.17 1.23  Sodium 135 - 145 mmol/L 137 134(L) 137  Potassium 3.5 - 5.1 mmol/L 4.9 4.1 4.2  Chloride 98 - 111 mmol/L 106 107 106  CO2 22 - 32 mmol/L '23 22 26  '$ Calcium 8.9 - 10.3 mg/dL 8.6(L) 8.5(L) 8.5(L)  Total Protein 6.5 - 8.1 g/dL - 7.8 -  Total Bilirubin 0.3 - 1.2 mg/dL - 0.6 -  Alkaline Phos 38 - 126 U/L - 83 -  AST 15 - 41 U/L - 36 -  ALT 0 - 44 U/L - 28 -      Microbiology: Recent Results (from the past 240 hour(s))  Resp Panel by RT-PCR (Flu A&B, Covid) Nasopharyngeal Swab  Status: None   Collection Time: 05/25/21  3:16 AM   Specimen: Nasopharyngeal Swab; Nasopharyngeal(NP) swabs in vial transport medium  Result Value Ref Range Status   SARS Coronavirus 2 by RT PCR NEGATIVE NEGATIVE Final    Comment: (NOTE) SARS-CoV-2 target nucleic acids are NOT DETECTED.  The SARS-CoV-2 RNA is generally detectable in upper respiratory specimens during the acute phase of infection. The lowest concentration of SARS-CoV-2 viral copies this assay can detect is 138 copies/mL. A negative result does not preclude SARS-Cov-2 infection and should not be used as the sole basis for treatment or other patient management decisions. A negative result may occur with  improper specimen collection/handling, submission of specimen other than nasopharyngeal swab, presence of viral mutation(s) within the areas targeted by this assay, and inadequate number of viral copies(<138 copies/mL). A negative result must be combined with clinical observations, patient history, and epidemiological information. The expected result is Negative.  Fact Sheet for Patients:  EntrepreneurPulse.com.au  Fact Sheet for Healthcare  Providers:  IncredibleEmployment.be  This test is no t yet approved or cleared by the [place] FDA and  has been authorized for detection and/or diagnosis of SARS-CoV-2 by FDA under an Emergency Use Authorization (EUA). This EUA will remain  in effect (meaning this test can be used) for the duration of the COVID-19 declaration under Section 564(b)(1) of the Act, 21 U.S.C.section 360bbb-3(b)(1), unless the authorization is terminated  or revoked sooner.       Influenza A by PCR NEGATIVE NEGATIVE Final   Influenza B by PCR NEGATIVE NEGATIVE Final    Comment: (NOTE) The Xpert Xpress SARS-CoV-2/FLU/RSV plus assay is intended as an aid in the diagnosis of influenza from Nasopharyngeal swab specimens and should not be used as a sole basis for treatment. Nasal washings and aspirates are unacceptable for Xpert Xpress SARS-CoV-2/FLU/RSV testing.  Fact Sheet for Patients: EntrepreneurPulse.com.au  Fact Sheet for Healthcare Providers: IncredibleEmployment.be  This test is not yet approved or cleared by the [place] FDA and has been authorized for detection and/or diagnosis of SARS-CoV-2 by FDA under an Emergency Use Authorization (EUA). This EUA will remain in effect (meaning this test can be used) for the duration of the COVID-19 declaration under Section 564(b)(1) of the Act, 21 U.S.C. section 360bbb-3(b)(1), unless the authorization is terminated or revoked.  Performed at [facility], [address] [place], [place] [address]   Resp Panel by RT-PCR (Flu A&B, Covid) Nasopharyngeal Swab     Status: None   Collection Time: 06/01/21  6:18 PM   Specimen: Nasopharyngeal Swab; Nasopharyngeal(NP) swabs in vial transport medium  Result Value Ref Range Status   SARS Coronavirus 2 by RT PCR NEGATIVE NEGATIVE Final    Comment: (NOTE) SARS-CoV-2 target nucleic acids are NOT DETECTED.  The SARS-CoV-2 RNA is generally  detectable in upper respiratory specimens during the acute phase of infection. The lowest concentration of SARS-CoV-2 viral copies this assay can detect is 138 copies/mL. A negative result does not preclude SARS-Cov-2 infection and should not be used as the sole basis for treatment or other patient management decisions. A negative result may occur with  improper specimen collection/handling, submission of specimen other than nasopharyngeal swab, presence of viral mutation(s) within the areas targeted by this assay, and inadequate number of viral copies(<138 copies/mL). A negative result must be combined with clinical observations, patient history, and epidemiological information. The expected result is Negative.  Fact Sheet for Patients:  EntrepreneurPulse.com.au  Fact Sheet for Healthcare Providers:  IncredibleEmployment.be  This test is no t yet approved or cleared by the [place] and  has been authorized for detection and/or diagnosis of SARS-CoV-2 by FDA under an Emergency Use Authorization (EUA). This EUA will remain  in effect (meaning this test can be used) for the duration of the COVID-19 declaration under Section 564(b)(1) of the Act, 21 U.S.C.section 360bbb-3(b)(1), unless the authorization is terminated  or revoked sooner.       Influenza A by PCR NEGATIVE NEGATIVE Final   Influenza B by PCR NEGATIVE NEGATIVE Final    Comment: (NOTE) The Xpert Xpress SARS-CoV-2/FLU/RSV plus assay is intended as an aid in the diagnosis of influenza from Nasopharyngeal swab specimens and should not be used as a sole basis for treatment. Nasal washings and aspirates are unacceptable for Xpert Xpress SARS-CoV-2/FLU/RSV testing.  Fact Sheet for Patients: EntrepreneurPulse.com.au  Fact Sheet for Healthcare Providers: IncredibleEmployment.be  This test is not yet approved or cleared by the [place] FDA  and has been authorized for detection and/or diagnosis of SARS-CoV-2 by FDA under an Emergency Use Authorization (EUA). This EUA will remain in effect (meaning this test can be used) for the duration of the COVID-19 declaration under Section 564(b)(1) of the Act, 21 U.S.C. section 360bbb-3(b)(1), unless the authorization is terminated or revoked.  Performed at [facility], [address] [place], [place] [address]   Culture, blood (Routine X 2) w Reflex to ID Panel     Status: None (Preliminary result)   Collection Time: 06/01/21  6:38 PM   Specimen: BLOOD  Result Value Ref Range Status   Specimen Description BLOOD BLOOD LEFT ARM  Final   Special Requests   Final    BOTTLES DRAWN AEROBIC ONLY Blood Culture adequate volume   Culture  Setup Time   Final    GRAM POSITIVE COCCI AEROBIC BOTTLE ONLY CRITICAL VALUE NOTED.  VALUE IS CONSISTENT WITH PREVIOUSLY REPORTED AND CALLED VALUE. 06/02/21 [id number] SJL Performed at [facility], [address] [place], [place] [address]    Culture [facility] POSITIVE COCCI  Final   Report Status PENDING  Incomplete  Culture, blood (Routine X 2) w Reflex to ID Panel     Status: None (Preliminary result)   Collection Time: 06/01/21  6:38 PM   Specimen: BLOOD  Result Value Ref Range Status   Specimen Description BLOOD BLOOD LEFT ARM  Final   Special Requests   Final    BOTTLES DRAWN AEROBIC AND ANAEROBIC Blood Culture adequate volume   Culture  Setup Time   Final    Organism ID to follow GRAM POSITIVE COCCI IN BOTH AEROBIC AND ANAEROBIC BOTTLES CRITICAL RESULT CALLED TO, READ BACK BY AND VERIFIED WITH: CHRISTINE THOMPSON 06/02/21 [id number] SJL Performed at [facility], [address] [place], [place] [address]    Culture GRAM POSITIVE COCCI  Final   Report Status PENDING  Incomplete  Blood Culture ID Panel (Reflexed)     Status: Abnormal   Collection Time: 06/01/21  6:38 PM  Result Value Ref Range Status    Enterococcus faecalis NOT DETECTED NOT DETECTED Final   Enterococcus Faecium NOT DETECTED NOT DETECTED Final   Listeria monocytogenes NOT DETECTED NOT DETECTED Final   Staphylococcus species DETECTED (A) NOT DETECTED Final    Comment: CRITICAL RESULT CALLED TO, READ BACK BY AND VERIFIED WITH: CHRISTINE THOMPSON, PHARMD AT 1000 ON 06/02/21 BY SJL    Staphylococcus aureus (BCID) DETECTED (A) NOT DETECTED Final    Comment: CRITICAL RESULT  CALLED TO, READ BACK BY AND VERIFIED WITH: Ewing, PHARMD AT 1000 ON 06/02/21 BY SJL    Staphylococcus epidermidis NOT DETECTED NOT DETECTED Final   Staphylococcus lugdunensis NOT DETECTED NOT DETECTED Final   Streptococcus species NOT DETECTED NOT DETECTED Final   Streptococcus agalactiae NOT DETECTED NOT DETECTED Final   Streptococcus pneumoniae NOT DETECTED NOT DETECTED Final   Streptococcus pyogenes NOT DETECTED NOT DETECTED Final   A.calcoaceticus-baumannii NOT DETECTED NOT DETECTED Final   Bacteroides fragilis NOT DETECTED NOT DETECTED Final   Enterobacterales NOT DETECTED NOT DETECTED Final   Enterobacter cloacae complex NOT DETECTED NOT DETECTED Final   Escherichia coli NOT DETECTED NOT DETECTED Final   Klebsiella aerogenes NOT DETECTED NOT DETECTED Final   Klebsiella oxytoca NOT DETECTED NOT DETECTED Final   Klebsiella pneumoniae NOT DETECTED NOT DETECTED Final   Proteus species NOT DETECTED NOT DETECTED Final   Salmonella species NOT DETECTED NOT DETECTED Final   Serratia marcescens NOT DETECTED NOT DETECTED Final   Haemophilus influenzae NOT DETECTED NOT DETECTED Final   Neisseria meningitidis NOT DETECTED NOT DETECTED Final   Pseudomonas aeruginosa NOT DETECTED NOT DETECTED Final   Stenotrophomonas maltophilia NOT DETECTED NOT DETECTED Final   Candida albicans NOT DETECTED NOT DETECTED Final   Candida auris NOT DETECTED NOT DETECTED Final   Candida glabrata NOT DETECTED NOT DETECTED Final   Candida krusei NOT DETECTED NOT  DETECTED Final   Candida parapsilosis NOT DETECTED NOT DETECTED Final   Candida tropicalis NOT DETECTED NOT DETECTED Final   Cryptococcus neoformans/gattii NOT DETECTED NOT DETECTED Final   Meth resistant mecA/C and MREJ NOT DETECTED NOT DETECTED Final    Comment: Performed at [facility], [address] [place], [place] [address]    IMAGING RESULTS: Acute osteomyelitis of the distal phalanx of the right second toe. I have personally reviewed the films ? Impression/Recommendation ? Recurrent staph aureus bacteremia.  Source very likely the right foot.  He had an infection in March 2022 and was treated with 4 weeks of IV antibiotics followed by p.o. antibiotics for 2 more weeks. Attended TEE was negative Patient currently on Vanco and cefepime and will be switched to cefazolin.  Because of pacemaker and TAVR he is going to need repeat TEE to look for vegetations.  Right foot second toe infection.  MRI shows acute osteomyelitis of the distal phalanx.  Patient seen by podiatrist.  Cultures have been sent.  Plan is to take him for surgery. Some peripheral artery disease status post angioplasty in April 2022.  History of bilateral knee replacements. ? ___________________________________________________ Discussed with patient, requesting provider Note:  This document was prepared using Dragon voice recognition software and may include unintentional dictation errors.

## 2021-06-02 NOTE — ED Notes (Signed)
Report from [place], [place].

## 2021-06-02 NOTE — Consult Note (Signed)
PODIATRY / FOOT AND ANKLE SURGERY CONSULTATION NOTE  Requesting Physician: Dr. Waldron Labs  Reason for consult: R 2nd toe ulcer with infection  Chief Complaint: R 2nd toe infection/wound   HPI: Nathan Singleton is a 80 y.o. male who presents with a chronic ulceration to the distal aspect of the right second toe.  Patient has been in and out of the hospital recently for syncope issues as well as other medical conditions.  Patient was noted to have increased redness and swelling to the right second toe with some purulent drainage yesterday and so patient was brought to the hospital today for further evaluation.  X-ray imaging did reveal osteomyelitis to the distal aspect of the right second toe compared to previous imaging.  Patient presents today resting in bed comfortably with fianc at bedside.  Patient currently denies nausea, vomiting, fever, chills.  PMHx:  Past Medical History:  Diagnosis Date   Aneurysm (arteriovenous) of coronary vessels    Arthritis    Ascending aortic aneurysm (HCC)    CAD (coronary artery disease)    Diabetes mellitus without complication ([place])    Hard of hearing    History of GI bleed    Hyperlipidemia associated with type 2 diabetes mellitus ([place])    Hypertension associated with diabetes ([place])    Presence of permanent cardiac pacemaker    PVD (peripheral vascular disease) ([place])    S/P TAVR (transcatheter aortic valve replacement)     Surgical Hx:  Past Surgical History:  Procedure Laterality Date   AMPUTATION TOE Right 03/20/2021   Procedure: AMPUTATION TOE;  Surgeon: Sharlotte Alamo, DPM;  Location: [facility] OR[facility];  Service: Podiatry;  Laterality: Right;   Ankle repaired Right    Carpal Tunnel repaired Left    CATARACT EXTRACTION Bilateral    COLONOSCOPY     Copillar implant      DG CHOLECYSTOGRAPHY GALL BLADDER ([facility] HX)     GASTRIC BYPASS     heart valve replaced     IRRIGATION AND DEBRIDEMENT FOOT Right 02/18/2021   Procedure: IRRIGATION AND DEBRIDEMENT  FOOT-Right Great Toe;  Surgeon: [name], DPM;  Location: [facility] OR[facility];  Service: Podiatry;  Laterality: Right;   LOWER EXTREMITY ANGIOGRAPHY Right 02/17/2021   Procedure: Lower Extremity Angiography;  Surgeon: Algernon Huxley, MD;  Location: [place] [facility];  Service: Cardiovascular;  Laterality: Right;   LOWER EXTREMITY ANGIOGRAPHY Right 03/27/2021   Procedure: Lower Extremity Angiography;  Surgeon: Algernon Huxley, MD;  Location: [place] [facility];  Service: Cardiovascular;  Laterality: Right;   PACEMAKER GENERATOR CHANGE     REPLACEMENT TOTAL KNEE BILATERAL     RTC     STENT PLACE LEFT URETER ([place] HX) Right    Right Leg   TEE WITHOUT CARDIOVERSION N/A 03/26/2021   Procedure: TRANSESOPHAGEAL ECHOCARDIOGRAM (TEE);  Surgeon: Kate Sable, MD;  Location: [facility];  Service: Cardiovascular;  Laterality: N/A;   Toe nail removed Bilateral     FHx:  Family History  Problem Relation Age of Onset   Heart disease Mother    Alzheimer's disease Mother    Heart disease Father    Diabetes Father     Social History:  reports that he has quit smoking. He has never used smokeless tobacco. He reports previous alcohol use. He reports previous drug use.  Allergies:  Allergies  Allergen Reactions   Atorvastatin Other (See Comments)    "bad for kidneys" Cramping    Celecoxib Other (See Comments)    Other reaction(s): Other (  See Comments) Kidney Problem Kidney Problem    Dicyclomine Hcl Other (See Comments)    Stomach cramps   Doxycycline Other (See Comments)    Per wife (per RN)- pt gets severe stomach pains even when takes with food and PCP said to not take     Metformin Other (See Comments)   (Not in a hospital admission)   Physical Exam: General: Alert and oriented.  No apparent distress  Vascular: DP/PT pulses diminished bilateral.  No hair growth noted to digits bilateral.  Moderate edema with mild erythema to the right second toe.  Neuro: Light touch sensation reduced  to bilateral lower extremities.  Derm: Open ulceration to the distal aspect of the right second toe, no apparent bone exposed at this time but right second toe appears to be grossly edematous and mildly erythematous, mild odor noted.  Serosanguineous drainage present, large hyperkeratotic tissue around the wound and at the second toenail level.  This was all removed today including the whole right second toe nail, no worsening purulence able to be expressed at this time from the area but once again toe appears to be grossly edematous.  No proximal streaking of erythema noted.  MSK: Status post right hallux amputation  Results for orders placed or performed during the hospital encounter of 06/01/21 (from the past 48 hour(s))  Lactic acid, plasma     Status: None   Collection Time: 06/01/21  2:52 PM  Result Value Ref Range   Lactic Acid, Venous 0.8 0.5 - 1.9 mmol/L    Comment: Performed at [facility], [address], [place], [place] [address]  Comprehensive metabolic panel     Status: Abnormal   Collection Time: 06/01/21  2:52 PM  Result Value Ref Range   Sodium 134 (L) 135 - 145 mmol/L   Potassium 4.1 3.5 - 5.1 mmol/L   Chloride 107 98 - 111 mmol/L   CO2 22 22 - 32 mmol/L   Glucose, Bld 171 (H) 70 - 99 mg/dL    Comment: Glucose reference range applies only to samples taken after fasting for at least 8 hours.   BUN 35 (H) 8 - 23 mg/dL   Creatinine, Ser 1.17 0.61 - 1.24 mg/dL   Calcium 8.5 (L) 8.9 - 10.3 mg/dL   Total Protein 7.8 6.5 - 8.1 g/dL   Albumin 3.7 3.5 - 5.0 g/dL   AST 36 15 - 41 U/L   ALT 28 0 - 44 U/L   Alkaline Phosphatase 83 38 - 126 U/L   Total Bilirubin 0.6 0.3 - 1.2 mg/dL   GFR, Estimated >60 >60 mL/min    Comment: (NOTE) Calculated using the CKD-EPI Creatinine Equation (2021)    Anion gap 5 5 - 15    Comment: Performed at [facility], [address] [place], [place] [address]  CBC with Differential     Status: Abnormal   Collection Time:  06/01/21  2:52 PM  Result Value Ref Range   WBC 5.6 4.0 - 10.5 K/uL   RBC 3.26 (L) 4.22 - 5.81 MIL/uL   Hemoglobin 9.8 (L) 13.0 - 17.0 g/dL   HCT 29.8 (L) 39.0 - 52.0 %   MCV 91.4 80.0 - 100.0 fL   MCH 30.1 26.0 - 34.0 pg   MCHC 32.9 30.0 - 36.0 g/dL   RDW 14.4 11.5 - 15.5 %   Platelets 119 (L) 150 - 400 K/uL    Comment: Immature Platelet Fraction may be clinically indicated, consider ordering this additional  test [id number]    nRBC 0.0 0.0 - 0.2 %   Neutrophils Relative % 80 %   Neutro Abs 4.4 1.7 - 7.7 K/uL   Lymphocytes Relative 11 %   Lymphs Abs 0.6 (L) 0.7 - 4.0 K/uL   Monocytes Relative 6 %   Monocytes Absolute 0.3 0.1 - 1.0 K/uL   Eosinophils Relative 3 %   Eosinophils Absolute 0.2 0.0 - 0.5 K/uL   Basophils Relative 0 %   Basophils Absolute 0.0 0.0 - 0.1 K/uL   Immature Granulocytes 0 %   Abs Immature Granulocytes 0.01 0.00 - 0.07 K/uL    Comment: Performed at [facility], [address] [place], [place] [address]  Urinalysis, Complete w Microscopic     Status: Abnormal   Collection Time: 06/01/21  2:52 PM  Result Value Ref Range   Color, Urine YELLOW (A) YELLOW   APPearance CLEAR (A) CLEAR   Specific Gravity, Urine 1.010 1.005 - 1.030   pH 5.0 5.0 - 8.0   Glucose, UA 150 (A) NEGATIVE mg/dL   Hgb urine dipstick NEGATIVE NEGATIVE   Bilirubin Urine NEGATIVE NEGATIVE   Ketones, ur NEGATIVE NEGATIVE mg/dL   Protein, ur 100 (A) NEGATIVE mg/dL   Nitrite NEGATIVE NEGATIVE   Leukocytes,Ua NEGATIVE NEGATIVE   RBC / HPF 0-5 0 - 5 RBC/hpf   WBC, UA 0-5 0 - 5 WBC/hpf   Bacteria, UA NONE SEEN NONE SEEN   Squamous Epithelial / LPF NONE SEEN 0 - 5   Mucus PRESENT    Hyaline Casts, UA PRESENT     Comment: Performed at [facility], [address], [place], [place] [address]  Resp Panel by RT-PCR (Flu A&B, Covid) Nasopharyngeal Swab     Status: None   Collection Time: 06/01/21  6:18 PM   Specimen: Nasopharyngeal Swab; Nasopharyngeal(NP) swabs in vial  transport medium  Result Value Ref Range   SARS Coronavirus 2 by RT PCR NEGATIVE NEGATIVE    Comment: (NOTE) SARS-CoV-2 target nucleic acids are NOT DETECTED.  The SARS-CoV-2 RNA is generally detectable in upper respiratory specimens during the acute phase of infection. The lowest concentration of SARS-CoV-2 viral copies this assay can detect is 138 copies/mL. A negative result does not preclude SARS-Cov-2 infection and should not be used as the sole basis for treatment or other patient management decisions. A negative result may occur with  improper specimen collection/handling, submission of specimen other than nasopharyngeal swab, presence of viral mutation(s) within the areas targeted by this assay, and inadequate number of viral copies(<138 copies/mL). A negative result must be combined with clinical observations, patient history, and epidemiological information. The expected result is Negative.  Fact Sheet for Patients:  EntrepreneurPulse.com.au  Fact Sheet for Healthcare Providers:  IncredibleEmployment.be  This test is no t yet approved or cleared by the [place] FDA and  has been authorized for detection and/or diagnosis of SARS-CoV-2 by FDA under an Emergency Use Authorization (EUA). This EUA will remain  in effect (meaning this test can be used) for the duration of the COVID-19 declaration under Section 564(b)(1) of the Act, 21 U.S.C.section 360bbb-3(b)(1), unless the authorization is terminated  or revoked sooner.       Influenza A by PCR NEGATIVE NEGATIVE   Influenza B by PCR NEGATIVE NEGATIVE    Comment: (NOTE) The Xpert Xpress SARS-CoV-2/FLU/RSV plus assay is intended as an aid in the diagnosis of influenza from Nasopharyngeal swab specimens and should not be used as a sole basis for treatment.  Nasal washings and aspirates are unacceptable for Xpert Xpress SARS-CoV-2/FLU/RSV testing.  Fact Sheet for  Patients: EntrepreneurPulse.com.au  Fact Sheet for Healthcare Providers: IncredibleEmployment.be  This test is not yet approved or cleared by the [place] FDA and has been authorized for detection and/or diagnosis of SARS-CoV-2 by FDA under an Emergency Use Authorization (EUA). This EUA will remain in effect (meaning this test can be used) for the duration of the COVID-19 declaration under Section 564(b)(1) of the Act, 21 U.S.C. section 360bbb-3(b)(1), unless the authorization is terminated or revoked.  Performed at [facility], [address] [place], [place] [address]   Culture, blood (Routine X 2) w Reflex to ID Panel     Status: None (Preliminary result)   Collection Time: 06/01/21  6:38 PM   Specimen: BLOOD  Result Value Ref Range   Specimen Description BLOOD BLOOD LEFT ARM    Special Requests      BOTTLES DRAWN AEROBIC ONLY Blood Culture adequate volume   Culture  Setup Time      GRAM POSITIVE COCCI AEROBIC BOTTLE ONLY CRITICAL VALUE NOTED.  VALUE IS CONSISTENT WITH PREVIOUSLY REPORTED AND CALLED VALUE. 06/02/21 [id number] SJL Performed at [facility], [address] [place], [place] [address]    Culture GRAM POSITIVE COCCI    Report Status PENDING   Culture, blood (Routine X 2) w Reflex to ID Panel     Status: None (Preliminary result)   Collection Time: 06/01/21  6:38 PM   Specimen: BLOOD  Result Value Ref Range   Specimen Description BLOOD BLOOD LEFT ARM    Special Requests      BOTTLES DRAWN AEROBIC AND ANAEROBIC Blood Culture adequate volume   Culture  Setup Time      Organism ID to follow [place] AND ANAEROBIC BOTTLES CRITICAL RESULT CALLED TO, READ BACK BY AND VERIFIED WITH: CHRISTINE THOMPSON 06/02/21 [id number] Performed at [facility], [address] [place], [place] [address]    Culture GRAM POSITIVE COCCI    Report Status PENDING   Blood Culture  ID Panel (Reflexed)     Status: Abnormal   Collection Time: 06/01/21  6:38 PM  Result Value Ref Range   Enterococcus faecalis NOT DETECTED NOT DETECTED   Enterococcus Faecium NOT DETECTED NOT DETECTED   Listeria monocytogenes NOT DETECTED NOT DETECTED   Staphylococcus species DETECTED (A) NOT DETECTED    Comment: CRITICAL RESULT CALLED TO, READ BACK BY AND VERIFIED WITH: CHRISTINE THOMPSON, PHARMD AT 1000 ON 06/02/21 BY SJL    Staphylococcus aureus (BCID) DETECTED (A) NOT DETECTED    Comment: CRITICAL RESULT CALLED TO, READ BACK BY AND VERIFIED WITH: CHRISTINE THOMPSON, PHARMD AT 1000 ON 06/02/21 BY SJL    Staphylococcus epidermidis NOT DETECTED NOT DETECTED   Staphylococcus lugdunensis NOT DETECTED NOT DETECTED   Streptococcus species NOT DETECTED NOT DETECTED   Streptococcus agalactiae NOT DETECTED NOT DETECTED   Streptococcus pneumoniae NOT DETECTED NOT DETECTED   Streptococcus pyogenes NOT DETECTED NOT DETECTED   A.calcoaceticus-baumannii NOT DETECTED NOT DETECTED   Bacteroides fragilis NOT DETECTED NOT DETECTED   Enterobacterales NOT DETECTED NOT DETECTED   Enterobacter cloacae complex NOT DETECTED NOT DETECTED   Escherichia coli NOT DETECTED NOT DETECTED   Klebsiella aerogenes NOT DETECTED NOT DETECTED   Klebsiella oxytoca NOT DETECTED NOT DETECTED   Klebsiella pneumoniae NOT DETECTED NOT DETECTED   Proteus species NOT DETECTED NOT DETECTED   Salmonella species NOT DETECTED NOT DETECTED  Serratia marcescens NOT DETECTED NOT DETECTED   Haemophilus influenzae NOT DETECTED NOT DETECTED   Neisseria meningitidis NOT DETECTED NOT DETECTED   Pseudomonas aeruginosa NOT DETECTED NOT DETECTED   Stenotrophomonas maltophilia NOT DETECTED NOT DETECTED   Candida albicans NOT DETECTED NOT DETECTED   Candida auris NOT DETECTED NOT DETECTED   Candida glabrata NOT DETECTED NOT DETECTED   Candida krusei NOT DETECTED NOT DETECTED   Candida parapsilosis NOT DETECTED NOT DETECTED   Candida  tropicalis NOT DETECTED NOT DETECTED   Cryptococcus neoformans/gattii NOT DETECTED NOT DETECTED   Meth resistant mecA/C and MREJ NOT DETECTED NOT DETECTED    Comment: Performed at [facility], [address] [place], [place] [address]  CBG monitoring, ED     Status: Abnormal   Collection Time: 06/02/21 12:17 AM  Result Value Ref Range   Glucose-Capillary 246 (H) 70 - 99 mg/dL    Comment: Glucose reference range applies only to samples taken after fasting for at least 8 hours.  Basic metabolic panel     Status: Abnormal   Collection Time: 06/02/21  5:24 AM  Result Value Ref Range   Sodium 137 135 - 145 mmol/L   Potassium 4.9 3.5 - 5.1 mmol/L   Chloride 106 98 - 111 mmol/L   CO2 23 22 - 32 mmol/L   Glucose, Bld 219 (H) 70 - 99 mg/dL    Comment: Glucose reference range applies only to samples taken after fasting for at least 8 hours.   BUN 37 (H) 8 - 23 mg/dL   Creatinine, Ser 1.21 0.61 - 1.24 mg/dL   Calcium 8.6 (L) 8.9 - 10.3 mg/dL   GFR, Estimated >60 >60 mL/min    Comment: (NOTE) Calculated using the CKD-EPI Creatinine Equation (2021)    Anion gap 8 5 - 15    Comment: Performed at [facility], [address] [place], [place] [address]  CBG monitoring, ED     Status: Abnormal   Collection Time: 06/02/21 10:03 AM  Result Value Ref Range   Glucose-Capillary 209 (H) 70 - 99 mg/dL    Comment: Glucose reference range applies only to samples taken after fasting for at least 8 hours.   DG Foot Complete Right  Result Date: 06/01/2021 CLINICAL DATA:  Right foot infection EXAM: RIGHT FOOT COMPLETE - 3+ VIEW COMPARISON:  05/07/2021 FINDINGS: New erosive changes of the distal tuft of the right second toe distal phalanx compatible with acute osteomyelitis. Diffuse soft tissue swelling of the second toe including a few tiny foci of soft tissue air at the level of the distal tuft, which may reflect ulceration or wound. No soft tissue gas is seen tracking proximally within  the foot. 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 No fracture or dislocation. No additional sites of bone destruction. Mild diffuse soft tissue swelling of the foot. Advanced vascular calcifications. IMPRESSION: 1. Acute osteomyelitis of the distal phalanx of the right second toe. 2. Diffuse soft tissue swelling of the second toe including a few tiny foci of soft tissue air at the level of the distal tuft, which may reflect ulceration or wound. Electronically Signed   By: Davina Poke D.O.   On: 06/01/2021 15:36    Blood pressure (!) 162/60, pulse 60, temperature 98.9 F (37.2 C), temperature source Oral, resp. rate 18, height '5\' 8"'$  (1.727 m), weight 100 kg, SpO2 98 %.  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 -X-ray imaging reviewed and discussed with patient detail.  Appears to show osteomyelitis to the distal phalanx of the right second toe. -Although bone does not appear to be exposed clinically, x-ray imaging and the appearance of the right second toe appear to be indicative of osteomyelitis. -The right second toenail appeared to be loosened from the nailbed and was removed today without incident as well as the hyperkeratotic tissue around the periphery of the wound. -Applied Betadine wet-to-dry dressing. -Wound culture taken and sent off.  Appreciate antibiotic recommendations per medicine standpoint. -Discussed all treatment options with patient both conservative and surgical attempts at correction include potential risks and complications at this time patient is elected for surgical procedure consisting of right second toe amputation.  All questions answered including postoperative course in detail.  No guarantees given.  Consent obtained. -Patient to be n.p.o. at midnight tonight for surgical intervention tomorrow with Dr. Vickki Muff around 1230. -Patient may be  partial weightbearing in the surgical shoe with heel contact. -Patient recently had vascular procedure performed back in April for revascularization attempt.  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  The peroneal artery and anterior tibial artery appeared to be patent.  Consider vascular consultation in the future if wound/incision postop appears to be nonhealing.  Caroline More, DPM 06/02/2021, 12:36 PM

## 2021-06-02 NOTE — Progress Notes (Signed)
PHARMACY - PHYSICIAN COMMUNICATION CRITICAL VALUE ALERT - BLOOD CULTURE IDENTIFICATION (BCID)  Nathan Singleton is an 80 y.o. male who presented to [facility] on 06/01/2021 with a chief complaint of foot pain  Assessment:  6/19 blood cultures with GPC, BCID = MSSA,  Worsening R 2nd toe wound likely source  Name of physician (or Provider) Contacted: Dr Waldron Labs  Current antibiotics: vancomycin and ceftriaxone  Changes to prescribed antibiotics recommended:  Auto- ID consult  Results for orders placed or performed during the hospital encounter of 06/01/21  Blood Culture ID Panel (Reflexed) (Collected: 06/01/2021  6:38 PM)  Result Value Ref Range   Enterococcus faecalis NOT DETECTED NOT DETECTED   Enterococcus Faecium NOT DETECTED NOT DETECTED   Listeria monocytogenes NOT DETECTED NOT DETECTED   Staphylococcus species DETECTED (A) NOT DETECTED   Staphylococcus aureus (BCID) DETECTED (A) NOT DETECTED   Staphylococcus epidermidis NOT DETECTED NOT DETECTED   Staphylococcus lugdunensis NOT DETECTED NOT DETECTED   Streptococcus species NOT DETECTED NOT DETECTED   Streptococcus agalactiae NOT DETECTED NOT DETECTED   Streptococcus pneumoniae NOT DETECTED NOT DETECTED   Streptococcus pyogenes NOT DETECTED NOT DETECTED   A.calcoaceticus-baumannii NOT DETECTED NOT DETECTED   Bacteroides fragilis NOT DETECTED NOT DETECTED   Enterobacterales NOT DETECTED NOT DETECTED   Enterobacter cloacae complex NOT DETECTED NOT DETECTED   Escherichia coli NOT DETECTED NOT DETECTED   Klebsiella aerogenes NOT DETECTED NOT DETECTED   Klebsiella oxytoca NOT DETECTED NOT DETECTED   Klebsiella pneumoniae NOT DETECTED NOT DETECTED   Proteus species NOT DETECTED NOT DETECTED   Salmonella species NOT DETECTED NOT DETECTED   Serratia marcescens NOT DETECTED NOT DETECTED   Haemophilus influenzae NOT DETECTED NOT DETECTED   Neisseria meningitidis NOT DETECTED NOT DETECTED   Pseudomonas aeruginosa NOT DETECTED NOT  DETECTED   Stenotrophomonas maltophilia NOT DETECTED NOT DETECTED   Candida albicans NOT DETECTED NOT DETECTED   Candida auris NOT DETECTED NOT DETECTED   Candida glabrata NOT DETECTED NOT DETECTED   Candida krusei NOT DETECTED NOT DETECTED   Candida parapsilosis NOT DETECTED NOT DETECTED   Candida tropicalis NOT DETECTED NOT DETECTED   Cryptococcus neoformans/gattii NOT DETECTED NOT DETECTED   Meth resistant mecA/C and MREJ NOT DETECTED NOT DETECTED    Doreene Eland, PharmD, BCPS.   Work Cell: [phone] 06/02/2021 11:42 AM

## 2021-06-02 NOTE — ED Notes (Signed)
Pt requesting something for pain. Pt is NPO for surgery this am and only PO pain medication orders are place. Secure chat message sent to brenda morrison, np for request.

## 2021-06-02 NOTE — Progress Notes (Signed)
PROGRESS NOTE    Nathan Singleton  [id number] DOB: 1941/10/28 DOA: 06/01/2021 PCP: Ranae Plumber, PA    Chief Complaint  Patient presents with   Foot Pain    Brief Narrative:    Nathan Singleton is a 80 y.o. 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  Last lower extremity angiography on 03/27/2021 by Dr. Lucky Cowboy showed patent stent and normal arteries other than chronically occluded posterior tibial artery.  Patient x-ray showing osteomyelitis, as well blood culture growing staph bacteremia.    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 -Patient empirically on IV vancomycin and Rocephin, now blood cultures BC ID D showing MSSA, antibiotic has been narrowed to cefazolin by ID, will await further recommendation regarding 2D echo.  We will obtain surveillance blood cultures in a.m. -Podiatry consult greatly appreciated,  plan for toe amputation in a.m.   Hypertension -Continue amlodipine Continue to hold Lasix for hospitalization last week due to syncope from hypotension   Chronic cardiovascular conditions S/pTAVR/PVD s/p stents/CAD s/p CABG/AAA ascending aorta 4 cm 11/2021/pacemaker Continue statins Continue Coreg, Imdur and amlodipine Hold Plavix pending amputation and resume after that  Type 2 diabetes mellitus with hyperlipidemia (HCC) Usually on Insulin Glargine 18 Units Subcutaneous Daily at bedtime, given he will be n.p.o., he is on 10 units subcu Lantus, continue with sliding scale   COPD (chronic obstructive pulmonary disease) (HCC) - Stable without acute exacerbation - Continue home inhalers    Chronic pain Continue gabapentin  Depression - Continue sertraline-pt says that he was taken off of this during last admission but no documentation of this   Gout -continue allopurinol  DVT prophylaxis: Lovenox Code Status: Full Family Communication: None at bedside, I have discussed with patient in detail and answered all his questions Disposition:   Status is: Inpatient  Remains inpatient appropriate because:IV treatments appropriate due to intensity of illness or inability to take PO  Dispo: The patient is from: Home              Anticipated d/c is to: Home              Patient currently is not medically stable to d/c.   Difficult to place patient No       Consultants:  Podiatry ID  Subjective:  He denies any chest pain, shortness of breath, currently denies any fever or chills.  Objective: Vitals:   06/02/21 0530 06/02/21 0628 06/02/21 0830 06/02/21 1251  BP: Marland Kitchen)  132/54  (!) 162/60 140/63  Pulse: 65  60 (!) 57  Resp: '18 20 18 18  '$ Temp: 98.9 F (37.2 C)     TempSrc: Oral     SpO2: 100% 100% 98% 100%  Weight:      Height:       No intake or output data in the 24 hours ending 06/02/21 1704 Filed Weights   06/01/21 1441  Weight: 100 kg     Examination:  General exam: Appears calm and comfortable  Respiratory system: Clear to auscultation. Respiratory effort normal. Cardiovascular system: S1 & S2 heard, RRR. No JVD, murmurs, rubs, gallops or clicks. No pedal edema. Gastrointestinal system: Abdomen is nondistended, soft and nontender. No organomegaly or masses felt. Normal bowel sounds heard. Central nervous system: Alert and oriented. No focal neurological deficits. Extremities: Right foot second toe swollen please see picture below. Skin: No rashes, lesions or ulcers Psychiatry: Judgement and insight appear normal. Mood & affect appropriate.        Data Reviewed: I have personally reviewed following labs and imaging studies  CBC: Recent Labs  Lab 06/01/21 1452  WBC 5.6  NEUTROABS 4.4  HGB 9.8*  HCT 29.8*  MCV 91.4  PLT 119*    Basic Metabolic Panel: Recent Labs  Lab 06/01/21 1452 06/02/21 0524  NA 134* 137  K 4.1 4.9  CL 107 106  CO2 22 23  GLUCOSE 171* 219*  BUN 35* 37*  CREATININE 1.17 1.21  CALCIUM 8.5* 8.6*    GFR: Estimated Creatinine Clearance: 57.6 mL/min (by C-G formula based on SCr of 1.21 mg/dL).  Liver Function Tests: Recent Labs  Lab 06/01/21 1452  AST 36  ALT 28  ALKPHOS 83  BILITOT 0.6  PROT 7.8  ALBUMIN 3.7    CBG: Recent Labs  Lab 05/27/21 0502 05/27/21 0733 06/02/21 0017 06/02/21 1003 06/02/21 1257  GLUCAP 207* 191* 246* 209* 269*     Recent Results (from the past 240 hour(s))  Resp Panel by RT-PCR (Flu A&B, Covid) Nasopharyngeal Swab     Status: None   Collection Time: 05/25/21  3:16 AM   Specimen: Nasopharyngeal Swab; Nasopharyngeal(NP) swabs in vial transport medium  Result Value Ref Range Status   SARS Coronavirus 2 by RT PCR NEGATIVE NEGATIVE Final    Comment: (NOTE) SARS-CoV-2 target nucleic acids are NOT DETECTED.  The SARS-CoV-2 RNA is generally detectable in upper respiratory specimens during the acute phase of infection. The  lowest concentration of SARS-CoV-2 viral copies this assay can detect is 138 copies/mL. A negative result does not preclude SARS-Cov-2 infection and should not be used as the sole basis for treatment or other patient management decisions. A negative result may occur with  improper specimen collection/handling, submission of specimen other than nasopharyngeal swab, presence of viral mutation(s) within the areas targeted by this assay, and inadequate number of viral copies(<138 copies/mL). A negative result must be combined with clinical observations, patient history, and epidemiological information. The expected result is Negative.  Fact Sheet for Patients:  EntrepreneurPulse.com.au  Fact Sheet for Healthcare Providers:  IncredibleEmployment.be  This test is no t yet approved or cleared by the [place] FDA and  has been authorized for detection and/or diagnosis of SARS-CoV-2 by FDA under an Emergency Use Authorization (EUA). This EUA will remain  in effect (meaning this test can be used) for the duration of the COVID-19 declaration under Section 564(b)(1) of the Act, 21 U.S.C.section 360bbb-3(b)(1), unless the authorization is terminated  or revoked sooner.  Influenza A by PCR NEGATIVE NEGATIVE Final   Influenza B by PCR NEGATIVE NEGATIVE Final    Comment: (NOTE) The Xpert Xpress SARS-CoV-2/FLU/RSV plus assay is intended as an aid in the diagnosis of influenza from Nasopharyngeal swab specimens and should not be used as a sole basis for treatment. Nasal washings and aspirates are unacceptable for Xpert Xpress SARS-CoV-2/FLU/RSV testing.  Fact Sheet for Patients: EntrepreneurPulse.com.au  Fact Sheet for Healthcare Providers: IncredibleEmployment.be  This test is not yet approved or cleared by the [place] FDA and has been authorized for detection and/or diagnosis of SARS-CoV-2 by FDA under  an Emergency Use Authorization (EUA). This EUA will remain in effect (meaning this test can be used) for the duration of the COVID-19 declaration under Section 564(b)(1) of the Act, 21 U.S.C. section 360bbb-3(b)(1), unless the authorization is terminated or revoked.  Performed at [facility], [address] [place], [place] [address]   Resp Panel by RT-PCR (Flu A&B, Covid) Nasopharyngeal Swab     Status: None   Collection Time: 06/01/21  6:18 PM   Specimen: Nasopharyngeal Swab; Nasopharyngeal(NP) swabs in vial transport medium  Result Value Ref Range Status   SARS Coronavirus 2 by RT PCR NEGATIVE NEGATIVE Final    Comment: (NOTE) SARS-CoV-2 target nucleic acids are NOT DETECTED.  The SARS-CoV-2 RNA is generally detectable in upper respiratory specimens during the acute phase of infection. The lowest concentration of SARS-CoV-2 viral copies this assay can detect is 138 copies/mL. A negative result does not preclude SARS-Cov-2 infection and should not be used as the sole basis for treatment or other patient management decisions. A negative result may occur with  improper specimen collection/handling, submission of specimen other than nasopharyngeal swab, presence of viral mutation(s) within the areas targeted by this assay, and inadequate number of viral copies(<138 copies/mL). A negative result must be combined with clinical observations, patient history, and epidemiological information. The expected result is Negative.  Fact Sheet for Patients:  EntrepreneurPulse.com.au  Fact Sheet for Healthcare Providers:  IncredibleEmployment.be  This test is no t yet approved or cleared by the [place] FDA and  has been authorized for detection and/or diagnosis of SARS-CoV-2 by FDA under an Emergency Use Authorization (EUA). This EUA will remain  in effect (meaning this test can be used) for the duration of the COVID-19 declaration under  Section 564(b)(1) of the Act, 21 U.S.C.section 360bbb-3(b)(1), unless the authorization is terminated  or revoked sooner.       Influenza A by PCR NEGATIVE NEGATIVE Final   Influenza B by PCR NEGATIVE NEGATIVE Final    Comment: (NOTE) The Xpert Xpress SARS-CoV-2/FLU/RSV plus assay is intended as an aid in the diagnosis of influenza from Nasopharyngeal swab specimens and should not be used as a sole basis for treatment. Nasal washings and aspirates are unacceptable for Xpert Xpress SARS-CoV-2/FLU/RSV testing.  Fact Sheet for Patients: EntrepreneurPulse.com.au  Fact Sheet for Healthcare Providers: IncredibleEmployment.be  This test is not yet approved or cleared by the [place] FDA and has been authorized for detection and/or diagnosis of SARS-CoV-2 by FDA under an Emergency Use Authorization (EUA). This EUA will remain in effect (meaning this test can be used) for the duration of the COVID-19 declaration under Section 564(b)(1) of the Act, 21 U.S.C. section 360bbb-3(b)(1), unless the authorization is terminated or revoked.  Performed at [facility], [address] [place], [place] [address]   Culture, blood (Routine X 2) w Reflex to ID Panel     Status:  None (Preliminary result)   Collection Time: 06/01/21  6:38 PM   Specimen: BLOOD  Result Value Ref Range Status   Specimen Description BLOOD BLOOD LEFT ARM  Final   Special Requests   Final    BOTTLES DRAWN AEROBIC ONLY Blood Culture adequate volume   Culture  Setup Time   Final    GRAM POSITIVE COCCI AEROBIC BOTTLE ONLY CRITICAL VALUE NOTED.  VALUE IS CONSISTENT WITH PREVIOUSLY REPORTED AND CALLED VALUE. 06/02/21 [id number] SJL Performed at [facility], [address] [place], [place] [address]    Culture [facility] POSITIVE COCCI  Final   Report Status PENDING  Incomplete  Culture, blood (Routine X 2) w Reflex to ID Panel     Status: None (Preliminary result)    Collection Time: 06/01/21  6:38 PM   Specimen: BLOOD  Result Value Ref Range Status   Specimen Description BLOOD BLOOD LEFT ARM  Final   Special Requests   Final    BOTTLES DRAWN AEROBIC AND ANAEROBIC Blood Culture adequate volume   Culture  Setup Time   Final    Organism ID to follow GRAM POSITIVE COCCI IN BOTH AEROBIC AND ANAEROBIC BOTTLES CRITICAL RESULT CALLED TO, READ BACK BY AND VERIFIED WITH: CHRISTINE THOMPSON 06/02/21 [id number] SJL Performed at [facility], [address] [place], [place] [address]    Culture GRAM POSITIVE COCCI  Final   Report Status PENDING  Incomplete  Blood Culture ID Panel (Reflexed)     Status: Abnormal   Collection Time: 06/01/21  6:38 PM  Result Value Ref Range Status   Enterococcus faecalis NOT DETECTED NOT DETECTED Final   Enterococcus Faecium NOT DETECTED NOT DETECTED Final   Listeria monocytogenes NOT DETECTED NOT DETECTED Final   Staphylococcus species DETECTED (A) NOT DETECTED Final    Comment: CRITICAL RESULT CALLED TO, READ BACK BY AND VERIFIED WITH: CHRISTINE THOMPSON, PHARMD AT 1000 ON 06/02/21 BY SJL    Staphylococcus aureus (BCID) DETECTED (A) NOT DETECTED Final    Comment: CRITICAL RESULT CALLED TO, READ BACK BY AND VERIFIED WITH: CHRISTINE THOMPSON, PHARMD AT 1000 ON 06/02/21 BY SJL    Staphylococcus epidermidis NOT DETECTED NOT DETECTED Final   Staphylococcus lugdunensis NOT DETECTED NOT DETECTED Final   Streptococcus species NOT DETECTED NOT DETECTED Final   Streptococcus agalactiae NOT DETECTED NOT DETECTED Final   Streptococcus pneumoniae NOT DETECTED NOT DETECTED Final   Streptococcus pyogenes NOT DETECTED NOT DETECTED Final   A.calcoaceticus-baumannii NOT DETECTED NOT DETECTED Final   Bacteroides fragilis NOT DETECTED NOT DETECTED Final   Enterobacterales NOT DETECTED NOT DETECTED Final   Enterobacter cloacae complex NOT DETECTED NOT DETECTED Final   Escherichia coli NOT DETECTED NOT DETECTED Final   Klebsiella  aerogenes NOT DETECTED NOT DETECTED Final   Klebsiella oxytoca NOT DETECTED NOT DETECTED Final   Klebsiella pneumoniae NOT DETECTED NOT DETECTED Final   Proteus species NOT DETECTED NOT DETECTED Final   Salmonella species NOT DETECTED NOT DETECTED Final   Serratia marcescens NOT DETECTED NOT DETECTED Final   Haemophilus influenzae NOT DETECTED NOT DETECTED Final   Neisseria meningitidis NOT DETECTED NOT DETECTED Final   Pseudomonas aeruginosa NOT DETECTED NOT DETECTED Final   Stenotrophomonas maltophilia NOT DETECTED NOT DETECTED Final   Candida albicans NOT DETECTED NOT DETECTED Final   Candida auris NOT DETECTED NOT DETECTED Final   Candida glabrata NOT DETECTED NOT DETECTED Final   Candida krusei NOT DETECTED NOT DETECTED Final   Candida parapsilosis NOT DETECTED NOT DETECTED Final  Candida tropicalis NOT DETECTED NOT DETECTED Final   Cryptococcus neoformans/gattii NOT DETECTED NOT DETECTED Final   Meth resistant mecA/C and MREJ NOT DETECTED NOT DETECTED Final    Comment: Performed at [facility] [address] [place], [place] [address]         Radiology Studies: DG Foot Complete Right  Result Date: 06/01/2021 CLINICAL DATA:  Right foot infection EXAM: RIGHT FOOT COMPLETE - 3+ VIEW COMPARISON:  05/07/2021 FINDINGS: New erosive changes of the distal tuft of the right second toe distal phalanx compatible with acute osteomyelitis. Diffuse soft tissue swelling of the second toe including a few tiny foci of soft tissue air at the level of the distal tuft, which may reflect ulceration or wound. No soft tissue gas is seen tracking proximally within the foot. 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 No fracture or dislocation. No additional sites of bone destruction. Mild diffuse soft tissue swelling of the foot. Advanced vascular calcifications. IMPRESSION: 1. Acute osteomyelitis of the distal phalanx of the right second toe. 2. Diffuse soft tissue swelling of the  second toe including a few tiny foci of soft tissue air at the level of the distal tuft, which may reflect ulceration or wound. Electronically Signed   By: Davina Poke D.O.   On: 06/01/2021 15:36        Scheduled Meds:  allopurinol  100 mg Oral Daily   amLODipine  10 mg Oral Daily   aspirin EC  81 mg Oral Daily   carvedilol  3.125 mg Oral BID   gabapentin  100 mg Oral Daily   insulin aspart  0-15 Units Subcutaneous TID PC,HS,0200   insulin glargine  10 Units Subcutaneous QHS   isosorbide mononitrate  30 mg Oral Daily   pantoprazole  40 mg Oral Daily   rosuvastatin  40 mg Oral Daily   sertraline  200 mg Oral Daily   Continuous Infusions:   ceFAZolin (ANCEF) IV       LOS: 1 day      Phillips Climes, MD [facility]   To contact the attending provider between 7A-7P or the covering provider during after hours 7P-7A, please log into the web site [email or web address] and access using universal [place] password for that web site. If you do not have the password, please call the hospital operator.  06/02/2021, 5:04 PM

## 2021-06-02 NOTE — ED Notes (Signed)
Report to jennifer, rn.  

## 2021-06-03 ENCOUNTER — Ambulatory Visit: Admission: RE | Admit: 2021-06-03 | Payer: 59 | Source: Home / Self Care | Admitting: Gastroenterology

## 2021-06-03 ENCOUNTER — Encounter
Admission: EM | Disposition: A | Discharge status: Home Health | Payer: Self-pay | Source: Home / Self Care | Attending: Obstetrics and Gynecology

## 2021-06-03 ENCOUNTER — Inpatient Hospital Stay: Payer: 59 | Admitting: Anesthesiology

## 2021-06-03 ENCOUNTER — Encounter: Admission: RE | Payer: Self-pay | Source: Home / Self Care

## 2021-06-03 DIAGNOSIS — L089 Local infection of the skin and subcutaneous tissue, unspecified: Secondary | ICD-10-CM

## 2021-06-03 HISTORY — PX: AMPUTATION TOE: SHX6595

## 2021-06-03 LAB — GLUCOSE, CAPILLARY
Glucose-Capillary: 246 mg/dL — ABNORMAL HIGH (ref 70–99)
Glucose-Capillary: 96 mg/dL (ref 70–99)
Glucose-Capillary: 163 mg/dL — ABNORMAL HIGH (ref 70–99)
Glucose-Capillary: 190 mg/dL — ABNORMAL HIGH (ref 70–99)
Glucose-Capillary: 264 mg/dL — ABNORMAL HIGH (ref 70–99)
Glucose-Capillary: 135 mg/dL — ABNORMAL HIGH (ref 70–99)

## 2021-06-03 LAB — BASIC METABOLIC PANEL
Anion gap: 7 (ref 5–15)
BUN: 34 mg/dL — ABNORMAL HIGH (ref 8–23)
CO2: 23 mmol/L (ref 22–32)
Calcium: 8.4 mg/dL — ABNORMAL LOW (ref 8.9–10.3)
Chloride: 107 mmol/L (ref 98–111)
Creatinine, Ser: 1.25 mg/dL — ABNORMAL HIGH (ref 0.61–1.24)
GFR, Estimated: 59 mL/min — ABNORMAL LOW (ref >60)
Glucose, Bld: 87 mg/dL (ref 70–99)
Potassium: 4.1 mmol/L (ref 3.5–5.1)
Sodium: 137 mmol/L (ref 135–145)

## 2021-06-03 LAB — CBC
HCT: 26.4 % — ABNORMAL LOW (ref 39.0–52.0)
Hemoglobin: 8.8 g/dL — ABNORMAL LOW (ref 13.0–17.0)
MCH: 29.6 pg (ref 26.0–34.0)
MCHC: 33.3 g/dL (ref 30.0–36.0)
MCV: 88.9 fL (ref 80.0–100.0)
Platelets: 115 10*3/uL — ABNORMAL LOW (ref 150–400)
RBC: 2.97 MIL/uL — ABNORMAL LOW (ref 4.22–5.81)
RDW: 14.4 % (ref 11.5–15.5)
WBC: 4.9 10*3/uL (ref 4.0–10.5)
nRBC: 0 % (ref 0.0–0.2)

## 2021-06-03 LAB — SURGICAL PCR SCREEN
MRSA, PCR: NEGATIVE
Staphylococcus aureus: POSITIVE — AB

## 2021-06-03 SURGERY — ESOPHAGOGASTRODUODENOSCOPY (EGD) WITH PROPOFOL
Anesthesia: General

## 2021-06-03 SURGERY — AMPUTATION, TOE
Anesthesia: General | Site: Toe | Laterality: Right

## 2021-06-03 MED ORDER — ACETAMINOPHEN 10 MG/ML IV SOLN
INTRAVENOUS | Status: DC | PRN
  Administered 2021-06-03: 1000 mg via INTRAVENOUS

## 2021-06-03 MED ORDER — BUPIVACAINE HCL 0.5 % IJ SOLN
INTRAMUSCULAR | Status: DC | PRN
  Administered 2021-06-03: 10 mL

## 2021-06-03 MED ORDER — CHLORHEXIDINE GLUCONATE 0.12 % MT SOLN
OROMUCOSAL | Status: AC
  Filled 2021-06-03: qty 15

## 2021-06-03 MED ORDER — PROPOFOL 10 MG/ML IV BOLUS
INTRAVENOUS | Status: DC | PRN
  Administered 2021-06-03: 20 mg via INTRAVENOUS

## 2021-06-03 MED ORDER — FENTANYL CITRATE (PF) 100 MCG/2ML IJ SOLN
INTRAMUSCULAR | Status: AC
  Filled 2021-06-03: qty 2

## 2021-06-03 MED ORDER — FENTANYL CITRATE (PF) 100 MCG/2ML IJ SOLN: 25.0000 ug | INTRAMUSCULAR | Status: DC | PRN

## 2021-06-03 MED ORDER — FENTANYL CITRATE (PF) 100 MCG/2ML IJ SOLN
INTRAMUSCULAR | Status: DC | PRN
  Administered 2021-06-03 (×2): 25 ug via INTRAVENOUS

## 2021-06-03 MED ORDER — ACETAMINOPHEN 10 MG/ML IV SOLN: 1000.0000 mg | Freq: Once | INTRAVENOUS | Status: DC | PRN

## 2021-06-03 MED ORDER — ONDANSETRON HCL 4 MG/2ML IJ SOLN: 4.0000 mg | Freq: Once | INTRAMUSCULAR | Status: DC | PRN

## 2021-06-03 MED ORDER — SODIUM CHLORIDE 0.9 % IV SOLN: INTRAVENOUS | Status: DC

## 2021-06-03 MED ORDER — CEFAZOLIN SODIUM-DEXTROSE 2-4 GM/100ML-% IV SOLN
INTRAVENOUS | Status: AC
  Filled 2021-06-03: qty 100

## 2021-06-03 MED ORDER — PROPOFOL 10 MG/ML IV BOLUS
INTRAVENOUS | Status: AC
  Filled 2021-06-03: qty 20

## 2021-06-03 MED ORDER — CHLORHEXIDINE GLUCONATE CLOTH 2 % EX PADS
6.0000 | MEDICATED_PAD | Freq: Every day | CUTANEOUS | Status: DC
  Administered 2021-06-03 – 2021-06-06 (×4): 6 via TOPICAL

## 2021-06-03 MED ORDER — MUPIROCIN 2 % EX OINT
1.0000 "application " | TOPICAL_OINTMENT | Freq: Two times a day (BID) | CUTANEOUS | Status: DC
  Administered 2021-06-03 – 2021-06-06 (×6): 1 via NASAL
  Filled 2021-06-03: qty 22

## 2021-06-03 MED ORDER — OXYCODONE HCL 5 MG PO TABS: 5.0000 mg | ORAL_TABLET | Freq: Once | ORAL | Status: DC | PRN

## 2021-06-03 MED ORDER — ACETAMINOPHEN 10 MG/ML IV SOLN
INTRAVENOUS | Status: AC
  Filled 2021-06-03: qty 100

## 2021-06-03 MED ORDER — OXYCODONE HCL 5 MG/5ML PO SOLN: 5.0000 mg | Freq: Once | ORAL | Status: DC | PRN

## 2021-06-03 MED ORDER — PROPOFOL 500 MG/50ML IV EMUL
INTRAVENOUS | Status: DC | PRN
  Administered 2021-06-03: 50 ug/kg/min via INTRAVENOUS

## 2021-06-03 SURGICAL SUPPLY — 45 items
BLADE OSC/SAGITTAL MD 5.5X18 (BLADE) ×1 IMPLANT
BLADE SURG MINI STRL (BLADE) ×2 IMPLANT
BNDG CONFORM 2 STRL LF (GAUZE/BANDAGES/DRESSINGS) ×2 IMPLANT
BNDG CONFORM 3 STRL LF (GAUZE/BANDAGES/DRESSINGS) ×4 IMPLANT
BNDG ELASTIC 4X5.8 VLCR NS LF (GAUZE/BANDAGES/DRESSINGS) ×2 IMPLANT
BNDG ESMARK 4X12 TAN STRL LF (GAUZE/BANDAGES/DRESSINGS) ×2 IMPLANT
BNDG GAUZE 4.5X4.1 6PLY STRL (MISCELLANEOUS) ×3 IMPLANT
CANISTER SUCT 1200ML W/VALVE (MISCELLANEOUS) ×2 IMPLANT
COVER WAND RF STERILE (DRAPES) ×2 IMPLANT
CUFF TOURN SGL QUICK 12 (TOURNIQUET CUFF) IMPLANT
CUFF TOURN SGL QUICK 18X4 (TOURNIQUET CUFF) IMPLANT
DRAPE FLUOR MINI C-ARM 54X84 (DRAPES) ×1 IMPLANT
DRAPE XRAY CASSETTE 23X24 (DRAPES) ×1 IMPLANT
DURAPREP 26ML APPLICATOR (WOUND CARE) ×1 IMPLANT
ELECT REM PT RETURN 9FT ADLT (ELECTROSURGICAL) ×2
ELECTRODE REM PT RTRN 9FT ADLT (ELECTROSURGICAL) ×1 IMPLANT
GAUZE PACKING IODOFORM 1/2 (PACKING) ×2 IMPLANT
GAUZE SPONGE 4X4 12PLY STRL (GAUZE/BANDAGES/DRESSINGS) ×2 IMPLANT
GAUZE XEROFORM 1X8 LF (GAUZE/BANDAGES/DRESSINGS) ×2 IMPLANT
GLOVE SURG ENC MOIS LTX SZ7.5 (GLOVE) ×3 IMPLANT
GLOVE SURG UNDER LTX SZ8 (GLOVE) ×3 IMPLANT
GOWN STRL REUS W/ TWL XL LVL3 (GOWN DISPOSABLE) ×2 IMPLANT
GOWN STRL REUS W/TWL XL LVL3 (GOWN DISPOSABLE) ×2
IV NS IRRIG 3000ML ARTHROMATIC (IV SOLUTION) ×1 IMPLANT
KIT TURNOVER KIT A (KITS) ×2 IMPLANT
LABEL OR SOLS (LABEL) ×2 IMPLANT
MANIFOLD NEPTUNE II (INSTRUMENTS) ×2 IMPLANT
NDL FILTER BLUNT 18X1 1/2 (NEEDLE) ×1 IMPLANT
NDL HYPO 25X1 1.5 SAFETY (NEEDLE) ×1 IMPLANT
NEEDLE FILTER BLUNT 18X 1/2SAF (NEEDLE) ×1
NEEDLE FILTER BLUNT 18X1 1/2 (NEEDLE) ×1 IMPLANT
NEEDLE HYPO 25X1 1.5 SAFETY (NEEDLE) ×2 IMPLANT
NS IRRIG 500ML POUR BTL (IV SOLUTION) ×2 IMPLANT
PACK EXTREMITY ARMC (MISCELLANEOUS) ×2 IMPLANT
PAD ABD DERMACEA PRESS 5X9 (GAUZE/BANDAGES/DRESSINGS) ×4 IMPLANT
PULSAVAC PLUS IRRIG FAN TIP (DISPOSABLE) ×2
SHIELD FULL FACE ANTIFOG 7M (MISCELLANEOUS) ×2 IMPLANT
STOCKINETTE M/LG 89821 (MISCELLANEOUS) ×2 IMPLANT
STRAP SAFETY 5IN WIDE (MISCELLANEOUS) ×2 IMPLANT
SUT ETHILON 3-0 FS-10 30 BLK (SUTURE) ×2
SUT ETHILON 5-0 FS-2 18 BLK (SUTURE) ×2 IMPLANT
SUT VIC AB 4-0 FS2 27 (SUTURE) ×2 IMPLANT
SUTURE EHLN 3-0 FS-10 30 BLK (SUTURE) ×1 IMPLANT
SYR 10ML LL (SYRINGE) ×6 IMPLANT
TIP FAN IRRIG PULSAVAC PLUS (DISPOSABLE) ×1 IMPLANT

## 2021-06-03 NOTE — Progress Notes (Signed)
   Date of Admission:  06/01/2021      ID: Nathan Singleton is a 80 y.o. 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) (L>R) w/o sciatica   COPD (chronic obstructive pulmonary disease) (HCC)   Depression   Gout   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HCC)    Subjective: Doing well Had surgery today Sitting in chair and having dinner  Medications:   allopurinol  100 mg Oral Daily   amLODipine  10 mg Oral Daily   aspirin EC  81 mg Oral Daily   carvedilol  3.125 mg Oral BID   chlorhexidine       Chlorhexidine Gluconate Cloth  6 each Topical Q0600   enoxaparin (LOVENOX) injection  0.5 mg/kg Subcutaneous Q24H   gabapentin  100 mg Oral Daily   insulin aspart  0-15 Units Subcutaneous TID PC,HS,0200   insulin glargine  10 Units Subcutaneous QHS   isosorbide mononitrate  30 mg Oral Daily   mupirocin ointment  1 application Nasal BID   pantoprazole  40 mg Oral Daily   rosuvastatin  40 mg Oral Daily   sertraline  200 mg Oral Daily    Objective: Vital signs in last 24 hours: Temp:  [97.8 F (36.6 C)-100.7 F (38.2 C)] 98.6 F (37 C) (06/21 1437) Pulse Rate:  [59-76] 60 (06/21 1437) Resp:  [15-20] 16 (06/21 1437) BP: (128-160)/(56-81) 148/63 (06/21 1437) SpO2:  [96 %-100 %] 100 % (06/21 1437)  PHYSICAL EXAM:  General: Alert, cooperative, no distress, appears stated age.  Head: Normocephalic, without obvious abnormality, atraumatic. Eyes: Conjunctivae clear, anicteric sclerae. Pupils are equal ENT Nares normal. No drainage or sinus tenderness. Lips, mucosa, and tongue normal. No Thrush Neck: Supple, symmetrical, no adenopathy, thyroid: non tender no carotid bruit and no JVD. Back: No CVA  tenderness. Lungs: Clear to auscultation bilaterally. No Wheezing or Rhonchi. No rales. Heart: Regular rate and rhythm, no murmur, rub or gallop. Abdomen: Soft, non-tender,not distended. Bowel sounds normal. No masses Extremities: rt foot surgical dresing Skin: No rashes or lesions. Or bruising Lymph: Cervical, supraclavicular normal. Neurologic: Grossly non-focal  Lab Results Recent Labs    06/01/21 1452 06/02/21 0524 06/03/21 0552  WBC 5.6  --  4.9  HGB 9.8*  --  8.8*  HCT 29.8*  --  26.4*  NA 134* 137 137  K 4.1 4.9 4.1  CL 107 106 107  CO2 '22 23 23  '$ BUN 35* 37* 34*  CREATININE 1.17 1.21 1.25*   Liver Panel Recent Labs    06/01/21 1452  PROT 7.8  ALBUMIN 3.7  AST 36  ALT 28  ALKPHOS 83  BILITOT 0.6   Sedimentation Rate No results for input(s): ESRSEDRATE in the last 72 hours. C-Reactive Protein No results for input(s): CRP in the last 72 hours.  Microbiology: 6/19 [facility] MSSA 06/03/21 -BC- pending Studies/Results: No results found.   Assessment/Plan: Staph aureus bacteremia on cefazolin  Infection of the right foot second toe Underwent amputation of the metatarsophalangeal joint right second toe today.  TAVR Pacemaker Patient needs  TEE to look for vegetation  History of recurrent MSSA bacteremia  Discussed the management with patient and care team

## 2021-06-03 NOTE — Anesthesia Postprocedure Evaluation (Signed)
Anesthesia Post Note  Patient: Nathan Singleton  Procedure(s) Performed: AMPUTATION TOE-SECOND TOE (Right: Toe)  Patient location during evaluation: PACU Anesthesia Type: General Level of consciousness: awake and alert Pain management: pain level controlled Vital Signs Assessment: post-procedure vital signs reviewed and stable Respiratory status: spontaneous breathing, nonlabored ventilation, respiratory function stable and patient connected to nasal cannula oxygen Cardiovascular status: blood pressure returned to baseline and stable Postop Assessment: no apparent nausea or vomiting Anesthetic complications: no   No notable events documented.   Last Vitals:  Vitals:   06/03/21 1422 06/03/21 1437  BP: (!) 156/81 (!) 148/63  Pulse: (!) 59 60  Resp: 16 16  Temp:  37 C  SpO2: 98% 100%    Last Pain:  Vitals:   06/03/21 1422  TempSrc:   PainSc: 0-No pain                 Arita Miss

## 2021-06-03 NOTE — Progress Notes (Addendum)
PROGRESS NOTE    Nathan Singleton  [id number] DOB: [birth year] DOA: 06/01/2021 PCP: Ranae Plumber, PA    Chief Complaint  Patient presents with   Foot Pain    Brief Narrative:    Nathan Singleton is a 80 y.o. 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  Last lower extremity angiography on 03/27/2021 by Dr. Lucky Cowboy showed patent stent and normal arteries other than chronically occluded posterior tibial artery.  Patient x-ray showing osteomyelitis, as well blood culture growing staph bacteremia.    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 -Patient empirically on IV vancomycin and Rocephin, now blood cultures BC ID D showing MSSA, antibiotic has been narrowed to cefazolin by ID. -We will need TEE given history of TAVR, and pacemaker.  Discussed with cardiology for TEE, will be performed tomorrow -Follow on surveillance  blood culture 6/21 -Podiatry consult greatly appreciated, s/p Amputation metatarsophalangeal joint right second toe by Dr. Vickki Muff 6/21    Hypertension -Continue amlodipine Continue to hold Lasix for hospitalization last week due to syncope from hypotension   Chronic cardiovascular conditions S/pTAVR/PVD s/p stents/CAD s/p CABG/AAA ascending aorta 4 cm 11/2021/pacemaker Continue statins Continue Coreg, Imdur and amlodipine Resume Plavix in 24 hours  Type 2 diabetes mellitus with hyperlipidemia (HCC) Usually on Insulin Glargine 18 Units Subcutaneous Daily at bedtime, given he will be n.p.o., he is on 10 units subcu Lantus, continue with sliding scale   COPD (chronic obstructive pulmonary disease) (HCC) - Stable without acute exacerbation - Continue home inhalers    Chronic pain Continue gabapentin  Depression - Continue sertraline-pt says that he was taken off of this during last admission but no documentation of this   Gout -continue allopurinol  DVT prophylaxis: Lovenox Code Status: Full Family Communication: None at bedside, I have discussed with patient in detail and answered all his questions Disposition:   Status is: Inpatient  Remains inpatient appropriate because:IV treatments appropriate due to intensity of illness or inability to take PO  Dispo: The patient is from: Home              Anticipated d/c is to: Home              Patient currently is not medically stable to d/c.   Difficult to place patient No       Consultants:  Podiatry ID  Subjective:  He denies any chest pain, shortness of breath, currently denies any fever or chills.  Objective: Vitals:  06/03/21 1407 06/03/21 1415 06/03/21 1422 06/03/21 1437  BP:  (!) 151/62 (!) 156/81 (!) 148/63  Pulse: 60 60 (!) 59 60  Resp: '16 18 16 16  '$ Temp:    98.6 F (37 C)  TempSrc:      SpO2: 99% 99% 98% 100%  Weight:      Height:        Intake/Output Summary (Last 24 hours) at 06/03/2021 1441 Last  data filed at 06/03/2021 1423 Gross per 24 hour  Intake 884.74 ml  Output 3 ml  Net 881.74 ml   Filed Weights   06/01/21 1441  Weight: 100 kg    Examination:  Awake Alert, Oriented X 3, No new F.N deficits, Normal affect Symmetrical Chest wall movement, Good air movement bilaterally, CTAB RRR,No Gallops,Rubs or new Murmurs, No Parasternal Heave +ve B.Sounds, Abd Soft, No tenderness, No rebound - guarding or rigidity. No Cyanosis, right foot bandaged  Patient was seen and examined before surgery     Data Reviewed: I have personally reviewed following labs and imaging studies  CBC: Recent Labs  Lab 06/01/21 1452 06/03/21 0552  WBC 5.6 4.9  NEUTROABS 4.4  --   HGB 9.8* 8.8*  HCT 29.8* 26.4*  MCV 91.4 88.9  PLT 119* 115*    Basic Metabolic Panel: Recent Labs  Lab 06/01/21 1452 06/02/21 0524 06/03/21 0552  NA 134* 137 137  K 4.1 4.9 4.1  CL 107 106 107  CO2 '22 23 23  '$ GLUCOSE 171* 219* 87  BUN 35* 37* 34*  CREATININE 1.17 1.21 1.25*  CALCIUM 8.5* 8.6* 8.4*    GFR: Estimated Creatinine Clearance: 55.8 mL/min (A) (by C-G formula based on SCr of 1.25 mg/dL (H)).  Liver Function Tests: Recent Labs  Lab 06/01/21 1452  AST 36  ALT 28  ALKPHOS 83  BILITOT 0.6  PROT 7.8  ALBUMIN 3.7    CBG: Recent Labs  Lab 06/02/21 2205 06/03/21 0200 06/03/21 0736 06/03/21 1231 06/03/21 1413  GLUCAP 224* 246* 96 163* 190*     Recent Results (from the past 240 hour(s))  Resp Panel by RT-PCR (Flu A&B, Covid) Nasopharyngeal Swab     Status: None   Collection Time: 05/25/21  3:16 AM   Specimen: Nasopharyngeal Swab; Nasopharyngeal(NP) swabs in vial transport medium  Result Value Ref Range Status   SARS Coronavirus 2 by RT PCR NEGATIVE NEGATIVE Final    Comment: (NOTE) SARS-CoV-2 target nucleic acids are NOT DETECTED.  The SARS-CoV-2 RNA is generally detectable in upper respiratory specimens during the acute phase of infection. The lowest concentration of  SARS-CoV-2 viral copies this assay can detect is 138 copies/mL. A negative result does not preclude SARS-Cov-2 infection and should not be used as the sole basis for treatment or other patient management decisions. A negative result may occur with  improper specimen collection/handling, submission of specimen other than nasopharyngeal swab, presence of viral mutation(s) within the areas targeted by this assay, and inadequate number of viral copies(<138 copies/mL). A negative result must be combined with clinical observations, patient history, and epidemiological information. The expected result is Negative.  Fact Sheet for Patients:  EntrepreneurPulse.com.au  Fact Sheet for Healthcare Providers:  IncredibleEmployment.be  This test is no t yet approved or cleared by the [place] FDA and  has been authorized for detection and/or diagnosis of SARS-CoV-2 by FDA under an Emergency Use Authorization (EUA). This EUA will remain  in effect (meaning this test can be used) for the duration of the COVID-19  declaration under Section 564(b)(1) of the Act, 21 U.S.C.section 360bbb-3(b)(1), unless the authorization is terminated  or revoked sooner.       Influenza A by PCR NEGATIVE NEGATIVE Final   Influenza B by PCR NEGATIVE NEGATIVE Final    Comment: (NOTE) The Xpert Xpress SARS-CoV-2/FLU/RSV plus assay is intended as an aid in the diagnosis of influenza from Nasopharyngeal swab specimens and should not be used as a sole basis for treatment. Nasal washings and aspirates are unacceptable for Xpert Xpress SARS-CoV-2/FLU/RSV testing.  Fact Sheet for Patients: EntrepreneurPulse.com.au  Fact Sheet for Healthcare Providers: IncredibleEmployment.be  This test is not yet approved or cleared by the [place] FDA and has been authorized for detection and/or diagnosis of SARS-CoV-2 by FDA under an Emergency Use  Authorization (EUA). This EUA will remain in effect (meaning this test can be used) for the duration of the COVID-19 declaration under Section 564(b)(1) of the Act, 21 U.S.C. section 360bbb-3(b)(1), unless the authorization is terminated or revoked.  Performed at [facility], [address] [place], [place] [address]   Resp Panel by RT-PCR (Flu A&B, Covid) Nasopharyngeal Swab     Status: None   Collection Time: 06/01/21  6:18 PM   Specimen: Nasopharyngeal Swab; Nasopharyngeal(NP) swabs in vial transport medium  Result Value Ref Range Status   SARS Coronavirus 2 by RT PCR NEGATIVE NEGATIVE Final    Comment: (NOTE) SARS-CoV-2 target nucleic acids are NOT DETECTED.  The SARS-CoV-2 RNA is generally detectable in upper respiratory specimens during the acute phase of infection. The lowest concentration of SARS-CoV-2 viral copies this assay can detect is 138 copies/mL. A negative result does not preclude SARS-Cov-2 infection and should not be used as the sole basis for treatment or other patient management decisions. A negative result may occur with  improper specimen collection/handling, submission of specimen other than nasopharyngeal swab, presence of viral mutation(s) within the areas targeted by this assay, and inadequate number of viral copies(<138 copies/mL). A negative result must be combined with clinical observations, patient history, and epidemiological information. The expected result is Negative.  Fact Sheet for Patients:  EntrepreneurPulse.com.au  Fact Sheet for Healthcare Providers:  IncredibleEmployment.be  This test is no t yet approved or cleared by the [place] FDA and  has been authorized for detection and/or diagnosis of SARS-CoV-2 by FDA under an Emergency Use Authorization (EUA). This EUA will remain  in effect (meaning this test can be used) for the duration of the COVID-19 declaration under Section 564(b)(1)  of the Act, 21 U.S.C.section 360bbb-3(b)(1), unless the authorization is terminated  or revoked sooner.       Influenza A by PCR NEGATIVE NEGATIVE Final   Influenza B by PCR NEGATIVE NEGATIVE Final    Comment: (NOTE) The Xpert Xpress SARS-CoV-2/FLU/RSV plus assay is intended as an aid in the diagnosis of influenza from Nasopharyngeal swab specimens and should not be used as a sole basis for treatment. Nasal washings and aspirates are unacceptable for Xpert Xpress SARS-CoV-2/FLU/RSV testing.  Fact Sheet for Patients: EntrepreneurPulse.com.au  Fact Sheet for Healthcare Providers: IncredibleEmployment.be  This test is not yet approved or cleared by the [place] FDA and has been authorized for detection and/or diagnosis of SARS-CoV-2 by FDA under an Emergency Use Authorization (EUA). This EUA will remain in effect (meaning this test can be used) for the duration of the COVID-19 declaration under Section 564(b)(1) of the Act, 21 U.S.C. section 360bbb-3(b)(1), unless the authorization is terminated or revoked.  Performed at [facility]  [facility], [address] [place], [place] [address]   Culture, blood (Routine X 2) w Reflex to ID Panel     Status: Abnormal (Preliminary result)   Collection Time: 06/01/21  6:38 PM   Specimen: BLOOD  Result Value Ref Range Status   Specimen Description   Final    BLOOD BLOOD LEFT ARM Performed at [facility], [address], [place], [place] [address]    Special Requests   Final    BOTTLES DRAWN AEROBIC ONLY Blood Culture adequate volume Performed at [facility], [address], [place], [place] [address]    Culture  Setup Time   Final    GRAM POSITIVE COCCI AEROBIC BOTTLE ONLY CRITICAL VALUE NOTED.  VALUE IS CONSISTENT WITH PREVIOUSLY REPORTED AND CALLED VALUE. 06/02/21 [id number] SJL Performed at [facility], [address] [place], [place] [address]    Culture  STAPHYLOCOCCUS AUREUS (A)  Final   Report Status PENDING  Incomplete  Culture, blood (Routine X 2) w Reflex to ID Panel     Status: Abnormal (Preliminary result)   Collection Time: 06/01/21  6:38 PM   Specimen: BLOOD  Result Value Ref Range Status   Specimen Description   Final    BLOOD BLOOD LEFT ARM Performed at [facility], [address], [place], [place] [address]    Special Requests   Final    BOTTLES DRAWN AEROBIC AND ANAEROBIC Blood Culture adequate volume Performed at [facility], [address] [place], [place] [address]    Culture  Setup Time   Final    Organism ID to follow [place] AND ANAEROBIC BOTTLES CRITICAL RESULT CALLED TO, READ BACK BY AND VERIFIED WITH: CHRISTINE THOMPSON 06/02/21 [id number] SJL Performed at [facility], [address], [place], [place] [address]    Culture (A)  Final    STAPHYLOCOCCUS AUREUS SUSCEPTIBILITIES TO FOLLOW Performed at [facility], [address] [place], [place] [address]    Report Status PENDING  Incomplete  Blood Culture ID Panel (Reflexed)     Status: Abnormal   Collection Time: 06/01/21  6:38 PM  Result Value Ref Range Status   Enterococcus faecalis NOT DETECTED NOT DETECTED Final   Enterococcus Faecium NOT DETECTED NOT DETECTED Final   Listeria monocytogenes NOT DETECTED NOT DETECTED Final   Staphylococcus species DETECTED (A) NOT DETECTED Final    Comment: CRITICAL RESULT CALLED TO, READ BACK BY AND VERIFIED WITH: CHRISTINE THOMPSON, PHARMD AT 1000 ON 06/02/21 BY SJL    Staphylococcus aureus (BCID) DETECTED (A) NOT DETECTED Final    Comment: CRITICAL RESULT CALLED TO, READ BACK BY AND VERIFIED WITH: CHRISTINE THOMPSON, PHARMD AT 1000 ON 06/02/21 BY SJL    Staphylococcus epidermidis NOT DETECTED NOT DETECTED Final   Staphylococcus lugdunensis NOT DETECTED NOT DETECTED Final   Streptococcus species NOT DETECTED NOT DETECTED Final   Streptococcus  agalactiae NOT DETECTED NOT DETECTED Final   Streptococcus pneumoniae NOT DETECTED NOT DETECTED Final   Streptococcus pyogenes NOT DETECTED NOT DETECTED Final   A.calcoaceticus-baumannii NOT DETECTED NOT DETECTED Final   Bacteroides fragilis NOT DETECTED NOT DETECTED Final   Enterobacterales NOT DETECTED NOT DETECTED Final   Enterobacter cloacae complex NOT DETECTED NOT DETECTED Final   Escherichia coli NOT DETECTED NOT DETECTED Final   Klebsiella aerogenes NOT DETECTED NOT DETECTED Final   Klebsiella oxytoca NOT DETECTED NOT DETECTED Final   Klebsiella pneumoniae NOT DETECTED NOT DETECTED Final   Proteus  species NOT DETECTED NOT DETECTED Final   Salmonella species NOT DETECTED NOT DETECTED Final   Serratia marcescens NOT DETECTED NOT DETECTED Final   Haemophilus influenzae NOT DETECTED NOT DETECTED Final   Neisseria meningitidis NOT DETECTED NOT DETECTED Final   Pseudomonas aeruginosa NOT DETECTED NOT DETECTED Final   Stenotrophomonas maltophilia NOT DETECTED NOT DETECTED Final   Candida albicans NOT DETECTED NOT DETECTED Final   Candida auris NOT DETECTED NOT DETECTED Final   Candida glabrata NOT DETECTED NOT DETECTED Final   Candida krusei NOT DETECTED NOT DETECTED Final   Candida parapsilosis NOT DETECTED NOT DETECTED Final   Candida tropicalis NOT DETECTED NOT DETECTED Final   Cryptococcus neoformans/gattii NOT DETECTED NOT DETECTED Final   Meth resistant mecA/C and MREJ NOT DETECTED NOT DETECTED Final    Comment: Performed at [facility], [address] [place], [place] [address]  Aerobic/Anaerobic Culture w Gram Stain (surgical/deep wound)     Status: None (Preliminary result)   Collection Time: 06/02/21 12:41 PM   Specimen: Wound  Result Value Ref Range Status   Specimen Description   Final    WOUND Performed at [facility], [address] [place], [place] [address]    Special Requests   Final    NONE Performed at [facility], [address] [place], [place] [address]    Gram Stain   Final    RARE WBC PRESENT,BOTH PMN AND MONONUCLEAR FEW GRAM POSITIVE COCCI IN PAIRS IN CLUSTERS    Culture   Final    CULTURE REINCUBATED FOR BETTER GROWTH Performed at [facility], [place] [address] [place], [place] [address]    Report Status PENDING  Incomplete  Surgical pcr screen     Status: Abnormal   Collection Time: 06/02/21 11:34 PM   Specimen: Nasal Mucosa; Nasal Swab  Result Value Ref Range Status   MRSA, PCR NEGATIVE NEGATIVE Final   Staphylococcus aureus POSITIVE (A) NEGATIVE Final    Comment: (NOTE) The Xpert SA Assay (FDA approved for NASAL specimens in patients 38 years of age and older), is one component of a comprehensive surveillance program. It is not intended to diagnose infection nor to guide or monitor treatment. Performed at [facility], [address] [place], [place] [address]          Radiology Studies: DG Foot Complete Right  Result Date: 06/01/2021 CLINICAL DATA:  Right foot infection EXAM: RIGHT FOOT COMPLETE - 3+ VIEW COMPARISON:  05/07/2021 FINDINGS: New erosive changes of the distal tuft of the right second toe distal phalanx compatible with acute osteomyelitis. Diffuse soft tissue swelling of the second toe including a few tiny foci of soft tissue air at the level of the distal tuft, which may reflect ulceration or wound. No soft tissue gas is seen tracking proximally within the foot. 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 No fracture or dislocation. No additional sites of bone destruction. Mild diffuse soft tissue swelling of the foot. Advanced vascular calcifications. IMPRESSION: 1. Acute osteomyelitis of the distal phalanx of the right second toe. 2. Diffuse soft tissue swelling of the second toe including a few tiny foci of soft tissue air at the level of the distal tuft, which may reflect ulceration or wound. Electronically Signed   By: Davina Poke D.O.    On: 06/01/2021 15:36        Scheduled Meds:  allopurinol  100 mg Oral Daily   amLODipine  10 mg Oral  Daily   aspirin EC  81 mg Oral Daily   carvedilol  3.125 mg Oral BID   chlorhexidine       Chlorhexidine Gluconate Cloth  6 each Topical Q0600   enoxaparin (LOVENOX) injection  0.5 mg/kg Subcutaneous Q24H   gabapentin  100 mg Oral Daily   insulin aspart  0-15 Units Subcutaneous TID PC,HS,0200   insulin glargine  10 Units Subcutaneous QHS   isosorbide mononitrate  30 mg Oral Daily   mupirocin ointment  1 application Nasal BID   pantoprazole  40 mg Oral Daily   rosuvastatin  40 mg Oral Daily   sertraline  200 mg Oral Daily   Continuous Infusions:   ceFAZolin (ANCEF) IV 0 g (06/03/21 0602)     LOS: 2 days      Phillips Climes, MD [facility]   To contact the attending provider between 7A-7P or the covering provider during after hours 7P-7A, please log into the web site [email or web address] and access using universal [place] password for that web site. If you do not have the password, please call the hospital operator.  06/03/2021, 2:41 PM

## 2021-06-03 NOTE — Progress Notes (Signed)
    [facility] has been requested to perform a transesophageal echocardiogram on Nathan Singleton for bacteremia.  After careful review of history and examination, the risks and benefits of transesophageal echocardiogram have been explained including risks of esophageal damage, perforation (1:10,000 risk), bleeding, pharyngeal hematoma as well as other potential complications associated with conscious sedation including aspiration, arrhythmia, respiratory failure and death. Alternatives to treatment were discussed, questions were answered. Patient is willing to proceed. TEE will be scheduled for Wed 6/22 w/ Dr. Garen Lah.  Murray Hodgkins, NP  06/03/2021 9:10 AM

## 2021-06-03 NOTE — Anesthesia Procedure Notes (Signed)
Procedure Name: General with mask airway Date/Time: 06/03/2021 1:15 PM Performed by: Johnna Acosta, CRNA Pre-anesthesia Checklist: Patient identified, Emergency Drugs available, Suction available, Patient being monitored and Timeout performed Patient Re-evaluated:Patient Re-evaluated prior to induction Oxygen Delivery Method: Simple face mask Preoxygenation: Pre-oxygenation with 100% oxygen

## 2021-06-03 NOTE — Transfer of Care (Signed)
Immediate Anesthesia Transfer of Care Note  Patient: Nathan Singleton  Procedure(s) Performed: AMPUTATION TOE-SECOND TOE (Right: Toe)  Patient Location: PACU  Anesthesia Type:General  Level of Consciousness: awake, alert  and oriented  Airway & Oxygen Therapy: Patient Spontanous Breathing  Post-op Assessment: Report given to RN and Post -op Vital signs reviewed and stable  Post vital signs: Reviewed and stable  Last Vitals:  Vitals Value Taken Time  BP 135/66 06/03/21 1403  Temp 37.2 C 06/03/21 1403  Pulse 60 06/03/21 1407  Resp 16 06/03/21 1407  SpO2 99 % 06/03/21 1407    Last Pain:  Vitals:   06/03/21 1223  TempSrc: Temporal  PainSc: 4          Complications: No notable events documented.

## 2021-06-03 NOTE — Progress Notes (Signed)
Inpatient Diabetes Program Recommendations  AACE/ADA: New Consensus Statement on Inpatient Glycemic Control   Target Ranges:  Prepandial:   less than 140 mg/dL      Peak postprandial:   less than 180 mg/dL (1-2 hours)      Critically ill patients:  140 - 180 mg/dL   Results for Nathan Singleton, Nathan Singleton (MRN [id number]) as of 06/03/2021 11:46  Ref. Range 06/02/2021 00:17 06/02/2021 10:03 06/02/2021 12:57 06/02/2021 18:06 06/02/2021 22:05 06/03/2021 02:00 06/03/2021 07:36  Glucose-Capillary Latest Ref Range: 70 - 99 mg/dL 246 (H) 209 (H) 269 (H) 171 (H) 224 (H) 246 (H) 96    Review of Glycemic Control  Diabetes history: DM2 Outpatient Diabetes medications: Insulin glargine-yfgn 18 units QHS Current orders for Inpatient glycemic control: Lantus 10 units QHS, Novolog 0-15 units AC&HS and 2am  Inpatient Diabetes Program Recommendations:    Insulin: Please consider changing frequency of CBGs to AC&HS and Novolog to 0-15 units AC&HS.   Thanks, Barnie Alderman, RN, MSN, CDE Diabetes Coordinator Inpatient Diabetes Program [phone] (Team Pager from 8am to 5pm)

## 2021-06-03 NOTE — H&P (Signed)
HISTORY AND PHYSICAL INTERVAL NOTE:  06/03/2021  12:48 PM  Nathan Singleton  has presented today for surgery, with the diagnosis of Right Second Toe Amp.  The various methods of treatment have been discussed with the patient.  No guarantees were given.  After consideration of risks, benefits and other options for treatment, the patient has consented to surgery.  I have reviewed the patients' chart and labs.     The patient was reexamined.  There have been no changes to this history and physical examination.  Nathan Singleton A

## 2021-06-03 NOTE — Op Note (Signed)
Operative note   Surgeon:Mariellen Blaney Lawyer: None    Preop diagnosis: Osteomyelitis right second toe    Postop diagnosis: Same    Procedure: Amputation metatarsophalangeal joint right second toe    EBL: Minimal    Anesthesia:local and IV sedation    Hemostasis: None    Specimen: Bone distal aspect right second toe for culture and second toe for pathology    Complications: None    Operative indications:Tiran Rammer is an 80 y.o. that presents today for surgical intervention.  The risks/benefits/alternatives/complications have been discussed and consent has been given.    Procedure:  Patient was brought into the OR and placed on the operating table in thesupine position. After anesthesia was obtained theright lower extremity was prepped and draped in usual sterile fashion.  Attention was directed to the right second toe where 2 semielliptical incisions were made at the base of the second toe joint.  Full-thickness flaps were created.  The toe was then disarticulated at the metatarsophalangeal joint.  Bleeders were Bovie cauterized as appropriate.  Good perfusion was noted throughout the surgery.  The wound was flushed with copious amounts of irrigation.  Closure was then performed with a 3-0 nylon for the skin.  A bulky sterile dressing was applied.  Bone from the distal aspect of the second toe was sent for culture and the second toe was sent for pathological examination.    Patient tolerated the procedure and anesthesia well.  Was transported from the OR to the PACU with all vital signs stable and vascular status intact. To be discharged per routine protocol.

## 2021-06-03 NOTE — Anesthesia Preprocedure Evaluation (Signed)
Anesthesia Evaluation  Patient identified by MRN, date of birth, ID band Patient awake    Reviewed: Allergy & Precautions, H&P , NPO status , Patient's Chart, lab work & pertinent test results, reviewed documented beta blocker date and time   History of Anesthesia Complications Negative for: history of anesthetic complications  Airway Mallampati: III  TM Distance: >3 FB Neck ROM: full    Dental  (+) Dental Advidsory Given, Poor Dentition, Missing, Chipped   Pulmonary shortness of breath and with exertion, sleep apnea , COPD, neg recent URI, former smoker,    Pulmonary exam normal breath sounds clear to auscultation       Cardiovascular Exercise Tolerance: Good hypertension, (-) angina+ CAD, + Past MI, + Cardiac Stents and + Peripheral Vascular Disease  Normal cardiovascular exam+ dysrhythmias + pacemaker + Valvular Problems/Murmurs AS  Rhythm:regular Rate:Normal  S/p TAVR  TTE 2022: 1. Left ventricular ejection fraction, by estimation, is >55%. The left  ventricle has normal function. Left ventricular endocardial border not  optimally defined to evaluate regional wall motion. Left ventricular  diastolic parameters are indeterminate.  2. Right ventricular systolic function is normal. The right ventricular  size is normal.  3. Left atrial size was mildly dilated.  4. The mitral valve was not well visualized. Mild mitral valve  regurgitation. No evidence of mitral stenosis.  5. The aortic valve was not well visualized. Aortic valve regurgitation  is not visualized. Mild aortic valve stenosis. Aortic valve mean gradient  measures 16.0 mmHg.  6. The inferior vena cava is dilated in size with <50% respiratory  variability, suggesting right atrial pressure of 15 mmHg.    Neuro/Psych PSYCHIATRIC DISORDERS Depression Bipolar Disorder negative neurological ROS     GI/Hepatic Neg liver ROS, PUD, GERD  ,  Endo/Other   diabetes  Renal/GU CRFRenal disease  negative genitourinary   Musculoskeletal  (+) Arthritis , Osteoarthritis,    Abdominal   Peds negative pediatric ROS (+)  Hematology negative hematology ROS (+) anemia ,   Anesthesia Other Findings Past Medical History: No date: Aneurysm (arteriovenous) of coronary vessels No date: Arthritis No date: Ascending aortic aneurysm (HCC) No date: CAD (coronary artery disease) No date: Diabetes mellitus without complication (HCC) No date: History of GI bleed No date: Hyperlipidemia associated with type 2 diabetes mellitus (HCC) No date: Hypertension associated with diabetes (HCC) No date: Presence of permanent cardiac pacemaker No date: PVD (peripheral vascular disease) (HCC) No date: S/P TAVR (transcatheter aortic valve replacement)   Reproductive/Obstetrics negative OB ROS                             Anesthesia Physical  Anesthesia Plan  ASA: 3  Anesthesia Plan: General   Post-op Pain Management:    Induction: Intravenous  PONV Risk Score and Plan: 2 and TIVA and Propofol infusion  Airway Management Planned: Natural Airway and Simple Face Mask  Additional Equipment: None  Intra-op Plan:   Post-operative Plan:   Informed Consent: I have reviewed the patients History and Physical, chart, labs and discussed the procedure including the risks, benefits and alternatives for the proposed anesthesia with the patient or authorized representative who has indicated his/her understanding and acceptance.     Dental Advisory Given  Plan Discussed with: Anesthesiologist, CRNA and Surgeon  Anesthesia Plan Comments: (Discussed risks of anesthesia with patient, including possibility of difficulty with spontaneous ventilation under anesthesia necessitating airway intervention, PONV, and rare risks such as cardiac or  respiratory or neurological events. Patient understands.)        Anesthesia Quick  Evaluation

## 2021-06-04 ENCOUNTER — Inpatient Hospital Stay (HOSPITAL_COMMUNITY)
Admit: 2021-06-04 | Discharge: 2021-06-04 | Disposition: A | Discharge status: Home / Self Care | Payer: 59 | Attending: Podiatry | Admitting: Podiatry

## 2021-06-04 ENCOUNTER — Encounter: Payer: Self-pay | Admitting: Podiatry

## 2021-06-04 ENCOUNTER — Encounter
Admission: EM | Disposition: A | Discharge status: Home Health | Payer: Self-pay | Source: Home / Self Care | Attending: Obstetrics and Gynecology

## 2021-06-04 DIAGNOSIS — I340 Nonrheumatic mitral (valve) insufficiency: Secondary | ICD-10-CM | POA: Diagnosis not present

## 2021-06-04 DIAGNOSIS — M86171 Other acute osteomyelitis, right ankle and foot: Secondary | ICD-10-CM

## 2021-06-04 DIAGNOSIS — R7881 Bacteremia: Secondary | ICD-10-CM | POA: Diagnosis not present

## 2021-06-04 DIAGNOSIS — M869 Osteomyelitis, unspecified: Secondary | ICD-10-CM

## 2021-06-04 HISTORY — PX: TEE WITHOUT CARDIOVERSION: SHX5443

## 2021-06-04 LAB — CULTURE, BLOOD (ROUTINE X 2)
Special Requests: ADEQUATE
Special Requests: ADEQUATE

## 2021-06-04 LAB — GLUCOSE, CAPILLARY
Glucose-Capillary: 147 mg/dL — ABNORMAL HIGH (ref 70–99)
Glucose-Capillary: 158 mg/dL — ABNORMAL HIGH (ref 70–99)
Glucose-Capillary: 180 mg/dL — ABNORMAL HIGH (ref 70–99)
Glucose-Capillary: 215 mg/dL — ABNORMAL HIGH (ref 70–99)
Glucose-Capillary: 288 mg/dL — ABNORMAL HIGH (ref 70–99)
Glucose-Capillary: 120 mg/dL — ABNORMAL HIGH (ref 70–99)

## 2021-06-04 SURGERY — ECHOCARDIOGRAM, TRANSESOPHAGEAL
Anesthesia: Choice

## 2021-06-04 MED ORDER — FENTANYL CITRATE (PF) 100 MCG/2ML IJ SOLN
INTRAMUSCULAR | Status: AC | PRN
  Administered 2021-06-04 (×3): 25 ug via INTRAVENOUS

## 2021-06-04 MED ORDER — CLOPIDOGREL BISULFATE 75 MG PO TABS
75.0000 mg | ORAL_TABLET | Freq: Every day | ORAL | Status: DC
  Administered 2021-06-04 – 2021-06-06 (×3): 75 mg via ORAL
  Filled 2021-06-04 (×3): qty 1

## 2021-06-04 MED ORDER — MIDAZOLAM HCL 2 MG/2ML IJ SOLN
INTRAMUSCULAR | Status: AC | PRN
  Administered 2021-06-04 (×3): 1 mg via INTRAVENOUS

## 2021-06-04 MED ORDER — INSULIN GLARGINE 100 UNIT/ML ~~LOC~~ SOLN
18.0000 [IU] | Freq: Every day | SUBCUTANEOUS | Status: DC
  Administered 2021-06-04: 18 [IU] via SUBCUTANEOUS
  Filled 2021-06-04 (×2): qty 0.18

## 2021-06-04 MED ORDER — LIDOCAINE VISCOUS HCL 2 % MT SOLN
OROMUCOSAL | Status: AC
  Administered 2021-06-04: 15 mL via ORAL
  Filled 2021-06-04: qty 15

## 2021-06-04 MED ORDER — BUTAMBEN-TETRACAINE-BENZOCAINE 2-2-14 % EX AERO
INHALATION_SPRAY | CUTANEOUS | Status: AC
  Filled 2021-06-04: qty 5

## 2021-06-04 MED ORDER — SODIUM CHLORIDE FLUSH 0.9 % IV SOLN
INTRAVENOUS | Status: AC
  Filled 2021-06-04: qty 10

## 2021-06-04 MED ORDER — FENTANYL CITRATE (PF) 100 MCG/2ML IJ SOLN
INTRAMUSCULAR | Status: AC
  Filled 2021-06-04: qty 2

## 2021-06-04 MED ORDER — OXYCODONE HCL 5 MG PO TABS
5.0000 mg | ORAL_TABLET | Freq: Four times a day (QID) | ORAL | Status: DC | PRN
  Administered 2021-06-05: 5 mg via ORAL
  Filled 2021-06-04: qty 1

## 2021-06-04 MED ORDER — MIDAZOLAM HCL 5 MG/5ML IJ SOLN
INTRAMUSCULAR | Status: AC
  Filled 2021-06-04: qty 5

## 2021-06-04 MED ORDER — SODIUM CHLORIDE 0.9 % IV SOLN
INTRAVENOUS | Status: AC | PRN
  Administered 2021-06-04: 10 mL/h via INTRAVENOUS

## 2021-06-04 NOTE — Progress Notes (Signed)
Verbal order received by MD Wouk  for carb mod diet. Order placed.

## 2021-06-04 NOTE — Progress Notes (Signed)
PROGRESS NOTE    Nathan Singleton  [id number] DOB: December 06, 1941 DOA: 06/01/2021 PCP: Ranae Plumber, PA    Chief Complaint  Patient presents with   Foot Pain    Brief Narrative:    Nathan Singleton is a 80 y.o. 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  Last lower extremity angiography on 03/27/2021 by Dr. Lucky Cowboy showed patent stent and normal arteries other than chronically occluded posterior tibial artery.  Patient x-ray showing osteomyelitis, as well blood culture growing staph bacteremia.    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 -Patient empirically on IV vancomycin and Rocephin, now blood cultures BC ID D showing MSSA, antibiotic has been narrowed to cefazolin by ID. -TEE w/o vegetations - surg path in process, culture ngtd - 6/21 blood cultures ngtd -Follow on surveillance blood culture  6/21 -Podiatry consult greatly appreciated, s/p Amputation metatarsophalangeal joint right second toe by Dr. Vickki Muff 6/21  - pt/ot consult today, may need snf   Hypertension -Continue amlodipine Continue to hold Lasix for hospitalization last week due to syncope from hypotension   Chronic cardiovascular conditions S/pTAVR/PVD s/p stents/CAD s/p CABG/AAA ascending aorta 4 cm 11/2021/pacemaker Continue statins Continue Coreg, Imdur and amlodipine Cont aspirin and plavix  Type 2 diabetes mellitus with hyperlipidemia (HCC) Cont lantus 18 qhs, ssi   COPD (chronic obstructive pulmonary disease) ([place]) - Stable without acute exacerbation - Continue home inhalers    Chronic pain Continue gabapentin  Depression - Continue sertraline-pt says that he was taken off of this during last admission but no documentation of this   Gout -continue allopurinol  DVT prophylaxis: Lovenox Code Status: Full Family Communication: wife updated @ bedside 6/22 Disposition:   Status is: Inpatient  Remains inpatient appropriate because:IV treatments appropriate due to intensity of illness or inability to take PO  Dispo: The patient is from: Home              Anticipated d/c is to: tbd              Patient currently is not medically stable to d/c.   Difficult to place patient No       Consultants:  Podiatry ID  Subjective:  He denies any chest pain, shortness of breath, currently denies any fever or chills. Toe pain controlled  Objective: Vitals:   06/04/21 1118 06/04/21 1130 06/04/21 1146 06/04/21 1207  BP: (!) 149/67 (!) 158/69 136/80 (!) 145/87  Pulse:  65 72  62  Resp:  '15 16 16  '$ Temp:    98 F (36.7 C)  TempSrc:    Oral  SpO2:  97% 99% 99%  Weight:      Height:        Intake/Output Summary (Last 24 hours) at 06/04/2021 1258 Last data filed at 06/04/2021 0509 Gross per 24 hour  Intake 1173.01 ml  Output 3 ml  Net 1170.01 ml   Filed Weights   06/01/21 1441  Weight: 100 kg     Examination:  Awake Alert, Oriented X 3, No new F.N deficits, Normal affect Symmetrical Chest wall movement, Good air movement bilaterally, CTAB RRR,No Gallops,Rubs or new Murmurs,  +ve B.Sounds, Abd Soft, No tenderness, No rebound - guarding or rigidity. No Cyanosis, right foot bandaged   Data Reviewed: I have personally reviewed following labs and imaging studies  CBC: Recent Labs  Lab 06/01/21 1452 06/03/21 0552  WBC 5.6 4.9  NEUTROABS 4.4  --   HGB 9.8* 8.8*  HCT 29.8* 26.4*  MCV 91.4 88.9  PLT 119* 115*    Basic Metabolic Panel: Recent Labs  Lab 06/01/21 1452 06/02/21 0524 06/03/21 0552  NA 134* 137 137  K 4.1 4.9 4.1  CL 107 106 107  CO2 '22 23 23  '$ GLUCOSE 171* 219* 87  BUN 35* 37* 34*  CREATININE 1.17 1.21 1.25*  CALCIUM 8.5* 8.6* 8.4*    GFR: Estimated Creatinine Clearance: 55.8 mL/min (A) (by C-G formula based on SCr of 1.25 mg/dL (H)).  Liver Function Tests: Recent Labs  Lab 06/01/21 1452  AST 36  ALT 28  ALKPHOS 83  BILITOT 0.6  PROT 7.8  ALBUMIN 3.7    CBG: Recent Labs  Lab 06/03/21 2138 06/04/21 0221 06/04/21 0748 06/04/21 0925 06/04/21 1207  GLUCAP 135* 147* 158* 180* 215*     Recent Results (from the past 240 hour(s))  Resp Panel by RT-PCR (Flu A&B, Covid) Nasopharyngeal Swab     Status: None   Collection Time: 06/01/21  6:18 PM   Specimen: Nasopharyngeal Swab; Nasopharyngeal(NP) swabs in vial transport medium  Result Value Ref Range Status   SARS Coronavirus 2 by RT PCR NEGATIVE NEGATIVE Final    Comment: (NOTE) SARS-CoV-2 target nucleic acids are NOT DETECTED.  The SARS-CoV-2 RNA is generally detectable in upper respiratory specimens during the acute phase of infection. The lowest concentration of SARS-CoV-2 viral copies this assay can detect is 138 copies/mL. A negative result does not preclude SARS-Cov-2 infection and should not be used as the sole basis for treatment or other patient management decisions. A  negative result may occur with  improper specimen collection/handling, submission of specimen other than nasopharyngeal swab, presence of viral mutation(s) within the areas targeted by this assay, and inadequate number of viral copies(<138 copies/mL). A negative result must be combined with clinical observations, patient history, and epidemiological information. The expected result is Negative.  Fact Sheet for Patients:  EntrepreneurPulse.com.au  Fact Sheet for Healthcare Providers:  IncredibleEmployment.be  This test is no t yet approved or cleared by the [place] FDA and  has been authorized for detection and/or diagnosis of SARS-CoV-2 by FDA under an Emergency Use Authorization (EUA). This EUA will remain  in effect (meaning this test can be used) for the duration of the COVID-19 declaration under Section 564(b)(1) of the Act, 21 U.S.C.section 360bbb-3(b)(1), unless the authorization is terminated  or revoked sooner.       Influenza A by PCR NEGATIVE NEGATIVE Final   Influenza  B by PCR NEGATIVE NEGATIVE Final    Comment: (NOTE) The Xpert Xpress SARS-CoV-2/FLU/RSV plus assay is intended as an aid in the diagnosis of influenza from Nasopharyngeal swab specimens and should not be used as a sole basis for treatment. Nasal washings and aspirates are unacceptable for Xpert Xpress SARS-CoV-2/FLU/RSV testing.  Fact Sheet for Patients: EntrepreneurPulse.com.au  Fact Sheet for Healthcare Providers: IncredibleEmployment.be  This test is not yet approved or cleared by the [place] FDA and has been authorized for detection and/or diagnosis of SARS-CoV-2 by FDA under an Emergency Use Authorization (EUA). This EUA will remain in effect (meaning this test can be used) for the duration of the COVID-19 declaration under Section 564(b)(1) of the Act, 21 U.S.C. section 360bbb-3(b)(1), unless the authorization  is terminated or revoked.  Performed at [facility], [address] [place], [place] [address]   Culture, blood (Routine X 2) w Reflex to ID Panel     Status: Abnormal   Collection Time: 06/01/21  6:38 PM   Specimen: BLOOD  Result Value Ref Range Status   Specimen Description   Final    BLOOD BLOOD LEFT ARM Performed at [facility], [address], [place], [place] [address]    Special Requests   Final    BOTTLES DRAWN AEROBIC ONLY Blood Culture adequate volume Performed at [facility], [address] [place], [place] [address]    Culture  Setup Time   Final    GRAM POSITIVE COCCI AEROBIC BOTTLE ONLY CRITICAL VALUE NOTED.  VALUE IS CONSISTENT WITH PREVIOUSLY REPORTED AND CALLED VALUE. 06/02/21 [id number] Performed at [facility], [address] [place], [place] [address]    Culture (A)  Final    STAPHYLOCOCCUS AUREUS SUSCEPTIBILITIES PERFORMED ON PREVIOUS CULTURE WITHIN THE LAST 5 DAYS. Performed at [facility], [place] [address], [place], [place] [address]    Report Status 06/04/2021 FINAL  Final  Culture, blood (Routine X 2) w Reflex to ID Panel     Status: Abnormal   Collection Time: 06/01/21  6:38 PM   Specimen: BLOOD  Result Value Ref Range Status   Specimen Description   Final    BLOOD BLOOD LEFT ARM Performed at [facility], [address] [place], [place] [address]    Special Requests   Final    BOTTLES DRAWN AEROBIC AND ANAEROBIC Blood Culture adequate volume Performed at [facility], [address] [place], [place] [address]    Culture  Setup Time   Final    Organism ID to follow GRAM POSITIVE COCCI IN BOTH AEROBIC AND ANAEROBIC BOTTLES CRITICAL RESULT CALLED TO, READ BACK BY AND VERIFIED WITH: CHRISTINE THOMPSON 06/02/21 [id number] Performed at [facility], [address] [place],  [address]    Culture STAPHYLOCOCCUS AUREUS (A)  Final   Report Status  06/04/2021 FINAL  Final   Organism ID, Bacteria STAPHYLOCOCCUS AUREUS  Final      Susceptibility   Staphylococcus aureus - MIC*    CIPROFLOXACIN <=0.5 SENSITIVE Sensitive     ERYTHROMYCIN >=8 RESISTANT Resistant     GENTAMICIN <=0.5 SENSITIVE Sensitive     OXACILLIN 0.5 SENSITIVE Sensitive     TETRACYCLINE <=1 SENSITIVE Sensitive     VANCOMYCIN <=0.5 SENSITIVE Sensitive     TRIMETH/SULFA <=10 SENSITIVE Sensitive     CLINDAMYCIN RESISTANT Resistant     RIFAMPIN <=0.5 SENSITIVE Sensitive     Inducible Clindamycin POSITIVE Resistant     *  STAPHYLOCOCCUS AUREUS  Blood Culture ID Panel (Reflexed)     Status: Abnormal   Collection Time: 06/01/21  6:38 PM  Result Value Ref Range Status   Enterococcus faecalis NOT DETECTED NOT DETECTED Final   Enterococcus Faecium NOT DETECTED NOT DETECTED Final   Listeria monocytogenes NOT DETECTED NOT DETECTED Final   Staphylococcus species DETECTED (A) NOT DETECTED Final    Comment: CRITICAL RESULT CALLED TO, READ BACK BY AND VERIFIED WITH: CHRISTINE THOMPSON, PHARMD AT 1000 ON 06/02/21 BY SJL    Staphylococcus aureus (BCID) DETECTED (A) NOT DETECTED Final    Comment: CRITICAL RESULT CALLED TO, READ BACK BY AND VERIFIED WITH: CHRISTINE THOMPSON, PHARMD AT 1000 ON 06/02/21 BY SJL    Staphylococcus epidermidis NOT DETECTED NOT DETECTED Final   Staphylococcus lugdunensis NOT DETECTED NOT DETECTED Final   Streptococcus species NOT DETECTED NOT DETECTED Final   Streptococcus agalactiae NOT DETECTED NOT DETECTED Final   Streptococcus pneumoniae NOT DETECTED NOT DETECTED Final   Streptococcus pyogenes NOT DETECTED NOT DETECTED Final   A.calcoaceticus-baumannii NOT DETECTED NOT DETECTED Final   Bacteroides fragilis NOT DETECTED NOT DETECTED Final   Enterobacterales NOT DETECTED NOT DETECTED Final   Enterobacter cloacae complex NOT DETECTED NOT DETECTED Final   Escherichia coli NOT DETECTED NOT DETECTED Final   Klebsiella aerogenes NOT DETECTED NOT DETECTED  Final   Klebsiella oxytoca NOT DETECTED NOT DETECTED Final   Klebsiella pneumoniae NOT DETECTED NOT DETECTED Final   Proteus species NOT DETECTED NOT DETECTED Final   Salmonella species NOT DETECTED NOT DETECTED Final   Serratia marcescens NOT DETECTED NOT DETECTED Final   Haemophilus influenzae NOT DETECTED NOT DETECTED Final   Neisseria meningitidis NOT DETECTED NOT DETECTED Final   Pseudomonas aeruginosa NOT DETECTED NOT DETECTED Final   Stenotrophomonas maltophilia NOT DETECTED NOT DETECTED Final   Candida albicans NOT DETECTED NOT DETECTED Final   Candida auris NOT DETECTED NOT DETECTED Final   Candida glabrata NOT DETECTED NOT DETECTED Final   Candida krusei NOT DETECTED NOT DETECTED Final   Candida parapsilosis NOT DETECTED NOT DETECTED Final   Candida tropicalis NOT DETECTED NOT DETECTED Final   Cryptococcus neoformans/gattii NOT DETECTED NOT DETECTED Final   Meth resistant mecA/C and MREJ NOT DETECTED NOT DETECTED Final    Comment: Performed at [facility], [address] [place], [place] [address]  Aerobic/Anaerobic Culture w Gram Stain (surgical/deep wound)     Status: None (Preliminary result)   Collection Time: 06/02/21 12:41 PM   Specimen: Wound  Result Value Ref Range Status   Specimen Description   Final    WOUND Performed at [facility], [address], [place], [place] [address]    Special Requests   Final    NONE Performed at [facility], [address] [place], [place] [address]    Gram Stain   Final    RARE WBC PRESENT,BOTH PMN AND MONONUCLEAR FEW GRAM POSITIVE COCCI IN PAIRS IN CLUSTERS    Culture   Final    ABUNDANT STAPHYLOCOCCUS AUREUS SUSCEPTIBILITIES TO FOLLOW CULTURE REINCUBATED FOR BETTER GROWTH Performed at [facility], [address], [place], [place] [address]    Report Status PENDING  Incomplete  Surgical pcr screen     Status: Abnormal   Collection Time: 06/02/21 11:34 PM   Specimen: Nasal Mucosa;  Nasal Swab  Result Value Ref Range Status   MRSA, PCR NEGATIVE NEGATIVE Final   Staphylococcus aureus POSITIVE (A) NEGATIVE Final    Comment: (NOTE)  The Xpert SA Assay (FDA approved for NASAL specimens in patients 38 years of age and older), is one component of a comprehensive surveillance program. It is not intended to diagnose infection nor to guide or monitor treatment. Performed at [facility], [address] [place], [place] [address]   CULTURE, BLOOD (ROUTINE X 2) w Reflex to ID Panel     Status: None (Preliminary result)   Collection Time: 06/03/21  7:01 AM   Specimen: BLOOD  Result Value Ref Range Status   Specimen Description BLOOD RIGHT HAND  Final   Special Requests   Final    BOTTLES DRAWN AEROBIC AND ANAEROBIC Blood Culture adequate volume   Culture   Final    NO GROWTH < 24 HOURS Performed at [facility], [address] [place], [place] [address]    Report Status PENDING  Incomplete  CULTURE, BLOOD (ROUTINE X 2) w Reflex to ID Panel     Status: None (Preliminary result)   Collection Time: 06/03/21  7:08 AM   Specimen: BLOOD  Result Value Ref Range Status   Specimen Description BLOOD LEFT HAND  Final   Special Requests   Final    BOTTLES DRAWN AEROBIC AND ANAEROBIC Blood Culture results may not be optimal due to an excessive volume of blood received in culture bottles   Culture   Final    NO GROWTH < 24 HOURS Performed at [facility], [address] [place], [place] [address]    Report Status PENDING  Incomplete  Aerobic/Anaerobic Culture w Gram Stain (surgical/deep wound)     Status: None (Preliminary result)   Collection Time: 06/03/21  1:35 PM   Specimen: PATH Digit amputation; Tissue  Result Value Ref Range Status   Specimen Description   Final    TISSUE Performed at [facility], [address] [place], [place] [address]    Special Requests   Final    RIGHT SECOND TOE CULTURE Performed at [facility], [address] [place], [place] [address]    Gram Stain   Final    FEW WBC PRESENT, PREDOMINANTLY PMN FEW GRAM POSITIVE COCCI IN PAIRS IN CLUSTERS    Culture   Final    TOO YOUNG TO READ Performed at [facility], [place] [address], [place], [place] [address]    Report Status PENDING  Incomplete         Radiology Studies: No results found.      Scheduled Meds:  allopurinol  100 mg Oral Daily   amLODipine  10 mg Oral Daily   aspirin EC  81 mg Oral Daily   butamben-tetracaine-benzocaine       carvedilol  3.125 mg Oral BID   Chlorhexidine Gluconate Cloth  6 each Topical Q0600   enoxaparin (LOVENOX) injection  0.5 mg/kg Subcutaneous Q24H   fentaNYL       gabapentin  100 mg Oral Daily   insulin aspart  0-15 Units Subcutaneous TID PC,HS,0200   insulin glargine  10 Units Subcutaneous QHS   isosorbide mononitrate  30 mg Oral Daily   midazolam       mupirocin ointment  1 application Nasal BID   pantoprazole  40 mg Oral Daily   rosuvastatin  40 mg Oral Daily   sertraline  200 mg Oral Daily   sodium chloride flush       Continuous Infusions:   ceFAZolin (ANCEF) IV 2 g (06/04/21 0509)     LOS: 3  days      Desma Maxim, MD [facility]   To contact the attending provider between 7A-7P or the covering provider during after hours 7P-7A, please log into the web site [email or web address] and access using universal [place] password for that web site. If you do not have the password, please call the hospital operator.  06/04/2021, 12:58 PM

## 2021-06-04 NOTE — Discharge Instructions (Signed)
Podiatry (Foot and Ankle) discharge instructions: 1.  Keep surgical dressings to your left foot clean, dry, and intact until your postoperative appointment in clinic.  If the dressings become wet or saturated or loosened, please contact our office for instructions.  If able to change the dressing then do so by removing the dressing, apply nonadherent gauze to the incision site, 4 x 4 gauze, roll gauze, Ace wrap.  Again only change the dressing if it becomes saturated or loosened, please leave the dressing on until your appointment if possible or change if it has been longer than 1 week or due to saturation or loosening. 2.  Ambulate as minimally as possible on your right foot in a surgical shoe at all times with heel contact only.  Try to limit weightbearing to around the house activities.  The more you are able to stay off of your foot the more likely the incision is to heal. 3.  Please take antibiotics as prescribed by infectious disease and continue followup 4.  Make an appointment for 1 week after discharge date for further evaluation of your incision and care.

## 2021-06-04 NOTE — Progress Notes (Signed)
PODIATRY / FOOT AND ANKLE SURGERY PROGRESS NOTE  Requesting Physician: Dr. Waldron Labs  Reason for consult: R 2nd toe ulcer with infection  Chief Complaint: R 2nd toe infection/wound   HPI: Nathan Singleton is a 80 y.o. male who presents status post 1 day right second toe amputation with Dr. Vickki Muff.  Patient has minimal to no pain to the area today.  He has also kept his dressings clean, dry, and intact.  He has noticed a little bit of bleeding through the bandage.  He has tried to stay off the foot is much possible since the procedure.  Patient had his TEE performed today.  He presents today resting in bed comfortably.  Patient denies nausea, vomiting, fever, chills.  PMHx:  Past Medical History:  Diagnosis Date   Aneurysm (arteriovenous) of coronary vessels    Arthritis    Ascending aortic aneurysm (HCC)    CAD (coronary artery disease)    Diabetes mellitus without complication ([place])    Hard of hearing    History of GI bleed    Hyperlipidemia associated with type 2 diabetes mellitus ([place])    Hypertension associated with diabetes ([place])    Presence of permanent cardiac pacemaker    PVD (peripheral vascular disease) ([place])    S/P TAVR (transcatheter aortic valve replacement)     Surgical Hx:  Past Surgical History:  Procedure Laterality Date   AMPUTATION TOE Right 03/20/2021   Procedure: AMPUTATION TOE;  Surgeon: Sharlotte Alamo, DPM;  Location: [facility];  Service: Podiatry;  Laterality: Right;   AMPUTATION TOE Right 06/03/2021   Procedure: AMPUTATION TOE-SECOND TOE;  Surgeon: [name], DPM;  Location: [facility];  Service: Podiatry;  Laterality: Right;   Ankle repaired Right    Carpal Tunnel repaired Left    CATARACT EXTRACTION Bilateral    COLONOSCOPY     Copillar implant      DG CHOLECYSTOGRAPHY GALL BLADDER ([facility] HX)     GASTRIC BYPASS     heart valve replaced     IRRIGATION AND DEBRIDEMENT FOOT Right 02/18/2021   Procedure: IRRIGATION AND DEBRIDEMENT FOOT-Right Great Toe;   Surgeon: [name], DPM;  Location: [facility];  Service: Podiatry;  Laterality: Right;   LOWER EXTREMITY ANGIOGRAPHY Right 02/17/2021   Procedure: Lower Extremity Angiography;  Surgeon: Algernon Huxley, MD;  Location: [place] [facility];  Service: Cardiovascular;  Laterality: Right;   LOWER EXTREMITY ANGIOGRAPHY Right 03/27/2021   Procedure: Lower Extremity Angiography;  Surgeon: Algernon Huxley, MD;  Location: [place] [facility];  Service: Cardiovascular;  Laterality: Right;   PACEMAKER GENERATOR CHANGE     REPLACEMENT TOTAL KNEE BILATERAL     RTC     STENT PLACE LEFT URETER ([place] HX) Right    Right Leg   TEE WITHOUT CARDIOVERSION N/A 03/26/2021   Procedure: TRANSESOPHAGEAL ECHOCARDIOGRAM (TEE);  Surgeon: Kate Sable, MD;  Location: [facility];  Service: Cardiovascular;  Laterality: N/A;   Toe nail removed Bilateral     FHx:  Family History  Problem Relation Age of Onset   Heart disease Mother    Alzheimer's disease Mother    Heart disease Father    Diabetes Father     Social History:  reports that he has quit smoking. He has never used smokeless tobacco. He reports previous alcohol use. He reports previous drug use.  Allergies:  Allergies  Allergen Reactions   Atorvastatin Other (See Comments)    "bad for kidneys" Cramping    Celecoxib Other (See Comments)  Other reaction(s): Other (See Comments) Kidney Problem Kidney Problem    Dicyclomine Hcl Other (See Comments)    Stomach cramps   Doxycycline Other (See Comments)    Per wife (per RN)- pt gets severe stomach pains even when takes with food and PCP said to not take     Metformin Other (See Comments)   Medications Prior to Admission  Medication Sig Dispense Refill   allopurinol (ZYLOPRIM) 100 MG tablet Take 100 mg by mouth daily.     amLODipine (NORVASC) 10 MG tablet Take 1 tablet (10 mg total) by mouth daily. 30 tablet 0   BAYER ASPIRIN EC LOW DOSE 81 MG EC tablet Take 81 mg by mouth daily.     carvedilol  (COREG) 3.125 MG tablet Take 3.125 mg by mouth 2 (two) times daily.     Cholecalciferol (VITAMIN D3) 125 MCG (5000 UT) CAPS Take 5,000 Units by mouth daily.     clopidogrel (PLAVIX) 75 MG tablet Take 1 tablet (75 mg total) by mouth daily. 60 tablet 3   gabapentin (NEURONTIN) 100 MG capsule Take 100 mg by mouth daily.     isosorbide mononitrate (IMDUR) 30 MG 24 hr tablet Take 30 mg by mouth daily.     multivitamin (ONE-A-DAY MEN'S) TABS tablet Take 1 tablet by mouth daily.     pantoprazole (PROTONIX) 40 MG tablet Take 1 tablet (40 mg total) by mouth daily. 90 tablet 1   sertraline (ZOLOFT) 100 MG tablet Take 200 mg by mouth daily.     acetaminophen (TYLENOL) 325 MG tablet Take 2 tablets (650 mg total) by mouth every 6 (six) hours as needed for fever, headache or moderate pain.     albuterol (VENTOLIN HFA) 108 (90 Base) MCG/ACT inhaler Inhale 2 puffs into the lungs every 4 (four) hours as needed for wheezing or shortness of breath. 18 g 0   Insulin Glargine-yfgn 100 UNIT/ML SOPN Inject 18 Units into the skin at bedtime.     loperamide (IMODIUM) 2 MG capsule Take 1 capsule (2 mg total) by mouth as needed for diarrhea or loose stools. 30 capsule 0   rosuvastatin (CRESTOR) 40 MG tablet Take 40 mg by mouth daily.       Physical Exam: General: Alert and oriented.  No apparent distress  Vascular: DP/PT pulses diminished bilateral.  No hair growth noted to digits bilateral.   Neuro: Light touch sensation reduced to bilateral lower extremities.  Derm: Right second toe amputation site appears to have skin edges well coapted with sutures intact, minimal to no erythema, mild edema consistent with postoperative course, no active drainage, no odor.    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  Glucose, capillary     Status: Abnormal   Collection Time: 06/02/21 10:05 PM  Result Value Ref Range   Glucose-Capillary 224 (H) 70 - 99 mg/dL    Comment: Glucose reference range applies only to samples taken after fasting for at least 8 hours.   Comment 1 Notify RN   Surgical pcr screen     Status: Abnormal  Collection Time: 06/02/21 11:34 PM   Specimen: Nasal Mucosa; Nasal Swab  Result Value Ref Range   MRSA, PCR NEGATIVE NEGATIVE   Staphylococcus aureus POSITIVE (A) NEGATIVE    Comment: (NOTE) The Xpert SA Assay (FDA approved for NASAL specimens in patients 25 years of age and older), is one component of a comprehensive surveillance program. It is not intended to diagnose infection nor to guide or monitor treatment. Performed at [facility], [address] [place], [place] [address]   Glucose, capillary     Status: Abnormal   Collection Time: 06/03/21  2:00 AM  Result Value Ref Range   Glucose-Capillary 246 (H) 70 - 99 mg/dL    Comment: Glucose reference range applies only to samples taken after fasting for at least 8 hours.   Comment 1 Notify RN    Comment 2 Document in Chart   CBC     Status: Abnormal   Collection Time: 06/03/21  5:52 AM  Result Value Ref Range   WBC 4.9 4.0 - 10.5 K/uL   RBC 2.97 (L) 4.22 - 5.81 MIL/uL   Hemoglobin 8.8 (L) 13.0 - 17.0 g/dL   HCT 26.4 (L) 39.0 - 52.0 %   MCV 88.9 80.0 - 100.0 fL   MCH 29.6 26.0 - 34.0 pg   MCHC 33.3 30.0 - 36.0 g/dL   RDW 14.4 11.5 - 15.5 %   Platelets 115 (L) 150 - 400 K/uL    Comment: Immature Platelet Fraction may be clinically indicated, consider ordering this additional test [id number]    nRBC 0.0 0.0 - 0.2 %    Comment: Performed at [facility], [address] [place], [place] [id number]  Basic metabolic panel      Status: Abnormal   Collection Time: 06/03/21  5:52 AM  Result Value Ref Range   Sodium 137 135 - 145 mmol/L   Potassium 4.1 3.5 - 5.1 mmol/L   Chloride 107 98 - 111 mmol/L   CO2 23 22 - 32 mmol/L   Glucose, Bld 87 70 - 99 mg/dL    Comment: Glucose reference range applies only to samples taken after fasting for at least 8 hours.   BUN 34 (H) 8 - 23 mg/dL   Creatinine, Ser 1.25 (H) 0.61 - 1.24 mg/dL   Calcium 8.4 (L) 8.9 - 10.3 mg/dL   GFR, Estimated 59 (L) >60 mL/min    Comment: (NOTE) Calculated using the CKD-EPI Creatinine Equation (2021)    Anion gap 7 5 - 15    Comment: Performed at [facility], [address] [place], [place] [address]  CULTURE, BLOOD (ROUTINE X 2) w Reflex to ID Panel     Status: None (Preliminary result)   Collection Time: 06/03/21  7:01 AM   Specimen: BLOOD  Result Value Ref Range   Specimen Description BLOOD RIGHT HAND    Special Requests      BOTTLES DRAWN AEROBIC AND ANAEROBIC Blood Culture adequate volume   Culture      NO GROWTH < 24 HOURS Performed at [facility], [address] [place], [place] [address]    Report Status PENDING   CULTURE, BLOOD (ROUTINE X 2) w Reflex to ID Panel     Status: None (Preliminary result)   Collection Time: 06/03/21  7:08 AM   Specimen: BLOOD  Result Value Ref Range   Specimen Description BLOOD LEFT HAND    Special Requests      BOTTLES DRAWN AEROBIC AND ANAEROBIC Blood  Culture results may not be optimal due to an excessive volume of blood received in culture bottles   Culture      NO GROWTH < 24 HOURS Performed at [facility], [address] [place], [place] [address]    Report Status PENDING   Glucose, capillary     Status: None   Collection Time: 06/03/21  7:36 AM  Result Value Ref Range   Glucose-Capillary 96 70 - 99 mg/dL    Comment: Glucose reference range applies only to samples taken after fasting for at least 8 hours.  Glucose, capillary     Status: Abnormal    Collection Time: 06/03/21 12:31 PM  Result Value Ref Range   Glucose-Capillary 163 (H) 70 - 99 mg/dL    Comment: Glucose reference range applies only to samples taken after fasting for at least 8 hours.  Aerobic/Anaerobic Culture w Gram Stain (surgical/deep wound)     Status: None (Preliminary result)   Collection Time: 06/03/21  1:35 PM   Specimen: PATH Digit amputation; Tissue  Result Value Ref Range   Specimen Description      TISSUE Performed at [facility], [address] [place], [place] [address]    Special Requests      RIGHT SECOND TOE CULTURE Performed at [facility], [address] [place], [place] [address]    Gram Stain      FEW WBC PRESENT, PREDOMINANTLY PMN FEW GRAM POSITIVE COCCI IN PAIRS IN CLUSTERS    Culture      TOO YOUNG TO READ Performed at [facility], [place] [address], [place], [place] [address]    Report Status PENDING   Glucose, capillary     Status: Abnormal   Collection Time: 06/03/21  2:13 PM  Result Value Ref Range   Glucose-Capillary 190 (H) 70 - 99 mg/dL    Comment: Glucose reference range applies only to samples taken after fasting for at least 8 hours.  Glucose, capillary     Status: Abnormal   Collection Time: 06/03/21  4:50 PM  Result Value Ref Range   Glucose-Capillary 264 (H) 70 - 99 mg/dL    Comment: Glucose reference range applies only to samples taken after fasting for at least 8 hours.  Glucose, capillary     Status: Abnormal   Collection Time: 06/03/21  9:38 PM  Result Value Ref Range   Glucose-Capillary 135 (H) 70 - 99 mg/dL    Comment: Glucose reference range applies only to samples taken after fasting for at least 8 hours.   Comment 1 Notify RN   Glucose, capillary     Status: Abnormal   Collection Time: 06/04/21  2:21 AM  Result Value Ref Range   Glucose-Capillary 147 (H) 70 - 99 mg/dL    Comment: Glucose reference range applies only to samples taken after fasting for at least 8 hours.  Glucose,  capillary     Status: Abnormal   Collection Time: 06/04/21  7:48 AM  Result Value Ref Range   Glucose-Capillary 158 (H) 70 - 99 mg/dL    Comment: Glucose reference range applies only to samples taken after fasting for at least 8 hours.  Glucose, capillary     Status: Abnormal   Collection Time: 06/04/21  9:25 AM  Result Value Ref Range   Glucose-Capillary 180 (H) 70 - 99 mg/dL    Comment: Glucose reference range applies only to samples taken after fasting for at least 8 hours.  Glucose, capillary  Status: Abnormal   Collection Time: 06/04/21 12:07 PM  Result Value Ref Range   Glucose-Capillary 215 (H) 70 - 99 mg/dL    Comment: Glucose reference range applies only to samples taken after fasting for at least 8 hours.   No results found.  Blood pressure (!) 145/87, pulse 62, temperature 98 F (36.7 C), temperature source Oral, resp. rate 16, height '5\' 8"'$  (1.727 m), weight 100 kg, SpO2 99 %.  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 -Amputation site appears to have skin edges well coapted to the right second toe with capillary fill time intact to flaps.  Sutures intact.  Mild erythema and edema area of procedure site consistent with postoperative course.  No signs of worsening infection to the area. -Applied Xeroform to the incision line followed by 4 x 4 gauze, Kerlix, ABD, Ace wrap with mild compression. -Patient may continue partial weightbearing with surgical shoe and try to walk mostly on his heel when ambulating. -Appreciate ID recommendations for antibiotic therapy.  As patient has had bacteremia I would likely suggest putting on IV antibiotics for few weeks based on cultures that come back.  Wound culture so far growing staph auerus.  Blood culture also grew  MSSA. -Patient is to keep this dressing clean, dry, and intact for the next week until postoperative visit in outpatient clinic. -Podiatry team to sign off at this time.  Follow-up instructions placed in chart.  Patient to follow-up within 1 week of surgical date in outpatient clinic for further assessment and care.  Caroline More, DPM 06/04/2021, 1:11 PM

## 2021-06-04 NOTE — Care Management Important Message (Signed)
Important Message  Patient Details  Name: Nathan Singleton MRN: [id number] Date of Birth: 11-24-1941   Medicare Important Message Given:  N/A - LOS <3 / Initial given by admissions     Juliann Pulse A Sunni Richardson 06/04/2021, 8:32 AM

## 2021-06-04 NOTE — Progress Notes (Signed)
*  PRELIMINARY RESULTS* Echocardiogram Echocardiogram Transesophageal has been performed.  Nathan Singleton 06/04/2021, 11:08 AM

## 2021-06-04 NOTE — Procedures (Signed)
Transesophageal Echocardiogram :  Indication: bacteremia  Procedure: 10 cc lidocaine were given orally to provide local anesthesia to the oropharynx. The patient was positioned supine on the left side, bite block provided. The patient was moderately sedated with the doses of versed and fentanyl as detailed below.  Using digital technique an omniplane probe was advanced into the esophagus without incident.   Moderate sedation: 1. Sedation used:  Versed: '3mg'$ , Fentanyl: 38mg 2. Time administered:   10:41  Time when patient started recovery: 11:09 3. I was face to face during this time 28 mins  See report in EPIC  for complete details: In brief, imaging revealed normal LV function with no RWMAs and no mural apical thrombus.  .  Estimated ejection fraction was 60%.  Right sided cardiac chambers were normal with no evidence of pulmonary hypertension.  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 No evidence for acute vegetation or endocarditis.  No valvular vegetation or endocarditis noted.   Imaging of the septum showed no ASD or VSD  The LA was well visualized in orthogonal views.  There was no spontaneous contrast and no thrombus in the LA and LA appendage   The descending thoracic aorta had no  mural aortic debris with no evidence of aneurysmal dilation or disection   Nathan Singleton 06/04/2021 11:15 AM

## 2021-06-04 NOTE — Progress Notes (Signed)
   Date of Admission:  06/01/2021        Subjective: Pt doing well TEE done today  Medications:   [MAR Hold] allopurinol  100 mg Oral Daily   [MAR Hold] amLODipine  10 mg Oral Daily   [MAR Hold] aspirin EC  81 mg Oral Daily   butamben-tetracaine-benzocaine       [MAR Hold] carvedilol  3.125 mg Oral BID   [MAR Hold] Chlorhexidine Gluconate Cloth  6 each Topical Q0600   [MAR Hold] enoxaparin (LOVENOX) injection  0.5 mg/kg Subcutaneous Q24H   fentaNYL       [MAR Hold] gabapentin  100 mg Oral Daily   [MAR Hold] insulin aspart  0-15 Units Subcutaneous TID PC,HS,0200   [MAR Hold] insulin glargine  10 Units Subcutaneous QHS   [MAR Hold] isosorbide mononitrate  30 mg Oral Daily   lidocaine       midazolam       [MAR Hold] mupirocin ointment  1 application Nasal BID   [MAR Hold] pantoprazole  40 mg Oral Daily   [MAR Hold] rosuvastatin  40 mg Oral Daily   [MAR Hold] sertraline  200 mg Oral Daily   sodium chloride flush        Objective: Vital signs in last 24 hours: Temp:  [97.8 F (36.6 C)-98.9 F (37.2 C)] 98.3 F (36.8 C) (06/22 0743) Pulse Rate:  [59-65] 60 (06/22 1000) Resp:  [15-21] 21 (06/22 1000) BP: (135-160)/(58-81) 157/70 (06/22 1000) SpO2:  [98 %-100 %] 100 % (06/22 1000)  PHYSICAL EXAM:  Rt foot  Surgical site clean, well approximated Lab Results Recent Labs    06/01/21 1452 06/02/21 0524 06/03/21 0552  WBC 5.6  --  4.9  HGB 9.8*  --  8.8*  HCT 29.8*  --  26.4*  NA 134* 137 137  K 4.1 4.9 4.1  CL 107 106 107  CO2 '22 23 23  '$ BUN 35* 37* 34*  CREATININE 1.17 1.21 1.25*   Liver Panel Recent Labs    06/01/21 1452  PROT 7.8  ALBUMIN 3.7  AST 36  ALT 28  ALKPHOS 83  BILITOT 0.6    Microbiology: 06/01/21 MSSA  06/03/21 -BC  6/21 WC    Assessment/Plan: MSSA bacteremia recurrent secondary to foot wounds Osteomyelitis of 2nd toe with wound- s/p amputation TAVR- TEE No endocarditis Pacemaker  Pt is currently on cefazolin- will need for 4  weeks  Tomorrow if blood culture remains negative PICC can be placed

## 2021-06-05 ENCOUNTER — Encounter: Payer: Self-pay | Admitting: Cardiology

## 2021-06-05 ENCOUNTER — Inpatient Hospital Stay: Payer: Self-pay

## 2021-06-05 DIAGNOSIS — M86171 Other acute osteomyelitis, right ankle and foot: Secondary | ICD-10-CM | POA: Diagnosis not present

## 2021-06-05 LAB — GLUCOSE, CAPILLARY
Glucose-Capillary: 223 mg/dL — ABNORMAL HIGH (ref 70–99)
Glucose-Capillary: 110 mg/dL — ABNORMAL HIGH (ref 70–99)
Glucose-Capillary: 268 mg/dL — ABNORMAL HIGH (ref 70–99)
Glucose-Capillary: 171 mg/dL — ABNORMAL HIGH (ref 70–99)
Glucose-Capillary: 186 mg/dL — ABNORMAL HIGH (ref 70–99)
Glucose-Capillary: 142 mg/dL — ABNORMAL HIGH (ref 70–99)

## 2021-06-05 LAB — BASIC METABOLIC PANEL
Anion gap: 7 (ref 5–15)
BUN: 37 mg/dL — ABNORMAL HIGH (ref 8–23)
CO2: 22 mmol/L (ref 22–32)
Calcium: 8.3 mg/dL — ABNORMAL LOW (ref 8.9–10.3)
Chloride: 104 mmol/L (ref 98–111)
Creatinine, Ser: 1.28 mg/dL — ABNORMAL HIGH (ref 0.61–1.24)
GFR, Estimated: 57 mL/min — ABNORMAL LOW (ref >60)
Glucose, Bld: 343 mg/dL — ABNORMAL HIGH (ref 70–99)
Potassium: 4.9 mmol/L (ref 3.5–5.1)
Sodium: 133 mmol/L — ABNORMAL LOW (ref 135–145)

## 2021-06-05 LAB — CBC
HCT: 26.6 % — ABNORMAL LOW (ref 39.0–52.0)
Hemoglobin: 8.9 g/dL — ABNORMAL LOW (ref 13.0–17.0)
MCH: 29.9 pg (ref 26.0–34.0)
MCHC: 33.5 g/dL (ref 30.0–36.0)
MCV: 89.3 fL (ref 80.0–100.0)
Platelets: 129 10*3/uL — ABNORMAL LOW (ref 150–400)
RBC: 2.98 MIL/uL — ABNORMAL LOW (ref 4.22–5.81)
RDW: 14.2 % (ref 11.5–15.5)
WBC: 3.2 10*3/uL — ABNORMAL LOW (ref 4.0–10.5)
nRBC: 0 % (ref 0.0–0.2)

## 2021-06-05 LAB — C-REACTIVE PROTEIN: CRP: 7.7 mg/dL — ABNORMAL HIGH (ref <1.0)

## 2021-06-05 LAB — SEDIMENTATION RATE: Sed Rate: 107 mm/hr — ABNORMAL HIGH (ref 0–20)

## 2021-06-05 MED ORDER — ALUM & MAG HYDROXIDE-SIMETH 200-200-20 MG/5ML PO SUSP
30.0000 mL | Freq: Four times a day (QID) | ORAL | Status: DC | PRN
  Administered 2021-06-05: 30 mL via ORAL
  Filled 2021-06-05: qty 30

## 2021-06-05 MED ORDER — SODIUM CHLORIDE 0.9% FLUSH
10.0000 mL | Freq: Two times a day (BID) | INTRAVENOUS | Status: DC
Start: 2021-06-05 — End: 2021-06-06
  Administered 2021-06-05 – 2021-06-06 (×2): 10 mL

## 2021-06-05 MED ORDER — INSULIN GLARGINE 100 UNIT/ML ~~LOC~~ SOLN
22.0000 [IU] | Freq: Every day | SUBCUTANEOUS | Status: DC
  Administered 2021-06-05: 22 [IU] via SUBCUTANEOUS
  Filled 2021-06-05 (×3): qty 0.22

## 2021-06-05 MED ORDER — SODIUM CHLORIDE 0.9% FLUSH: 10.0000 mL | INTRAVENOUS | Status: DC | PRN

## 2021-06-05 NOTE — Evaluation (Signed)
Physical Therapy Evaluation Patient Details Name: Natalie Corr MRN: [id number] DOB: 07-Aug-1941 Today's Date: 06/05/2021   History of Present Illness  Bo Storment is a 80 y.o. male with medical history significant for DM, HTN, PVD, pacemaker, aortic stenosis s/p TAVR, CAD status post CABG, ascending aortic aneurysm 4 cm December 2021, chronic pain, chronic anemia, bipolar and chronic thrombocytopenia who presents to the emergency room for R foot pain and bleeding from 2nd digit. 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 Received R popliteal stent with RLE angiography on 03/27/21 with chronic occluded post tib artery. 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   Clinical Impression  Pt admitted with above diagnosis. Pt received sitting upright in bed and agreeable to PT services. BP monioted prior to mobilization: 153/68 mm Hg. Pt mod-I with all bed mobility and STS and stand <--> sit transfers with safe sequencing. PT verbalized to pt on RLE WB precautions and pt verbalized understanding prior to mobilization. Pt able to amb 250' with RW Mod-I. Initial PT demo for how to sequence RW with asc/desc 6" step to mimic pt safely entering/exiting home. Minguard for Pt performance of asc/desc step with RW and multimodal cuing for sequencing and appropriate foot placement. Pt performed twice displaying good safety but can use reinforcement in the future. Pt returned to room and able to void in bathroom mod-I with no PT assist for supervision. Pt returned to recliner. Despite 2nd toe MTP amputation, pt displaying good functional mobility with household and short community distances with no LOB or reports of fatigue with RW use. Pt can benefit from additional PT while admitted for further reinforcement on correct sequencing with RW asc/desc step to safely enter and exit home once D/c'd from hospital. Pt currently with functional limitations due to the deficits listed below (see PT Problem List). Pt will  benefit from skilled PT to increase their independence and safety with mobility to allow discharge to the venue listed below.          Follow Up Recommendations Home health PT    Equipment Recommendations  None recommended by PT    Recommendations for Other Services       Precautions / Restrictions Precautions Precautions: Fall;Other (comment) (R foot 2nd MTP amputation) Restrictions Weight Bearing Restrictions: Yes RLE Weight Bearing: Weight bearing as tolerated Other Position/Activity Restrictions: WBAT in surgical shoe with heel contact.      Mobility  Bed Mobility Overal bed mobility: Modified Independent Bed Mobility: Supine to Sit     Supine to sit: Modified independent (Device/Increase time)     General bed mobility comments: Returned to recliner    Transfers Overall transfer level: Needs assistance Equipment used: Rolling walker (2 wheeled) Transfers: Sit to/from Stand Sit to Stand: Modified independent (Device/Increase time)         General transfer comment: steady transfers with RW use  Ambulation/Gait Ambulation/Gait assistance: Modified independent (Device/Increase time) Gait Distance (Feet): 250 Feet Assistive device: Rolling walker (2 wheeled) Gait Pattern/deviations: Step-through pattern;Decreased stance time - right     General Gait Details: steady ambulation with RW use  Stairs Stairs: Yes Stairs assistance: Min guard Stair Management: With walker Number of Stairs: 1 General stair comments: Required PT demo and multimodal cuing for correct RW sequencing. Overall safe though. Can use reinforcement prior to D/c.  Wheelchair Mobility    Modified Rankin (Stroke Patients Only)       Balance Overall balance assessment: Needs assistance Sitting-balance support: No upper extremity supported;Feet  supported Sitting balance-Leahy Scale: Normal     Standing balance support: Bilateral upper extremity supported;During functional  activity Standing balance-Leahy Scale: Good Standing balance comment: Able to walk in room with no AD to recliner maintaining RLE precautions (5')                             Pertinent Vitals/Pain Pain Assessment: 0-10 Pain Score: 6  Pain Location: RUE surgical site Pain Intervention(s): Limited activity within patient's tolerance;Monitored during session;Repositioned    Home Living Family/patient expects to be discharged to:: Private residence Living Arrangements: Spouse/significant other Available Help at Discharge: Family;Available 24 hours/day Type of Home: House Home Access: Stairs to enter Entrance Stairs-Rails: None Entrance Stairs-Number of Steps: single step to enter home Home Layout: One level Home Equipment: Environmental consultant - 2 wheels;Cane - single point Additional Comments: Reports new RW he received last hospitalization is too narrow. Brought in his older RW.    Prior Function Level of Independence: Independent with assistive device(s);Needs assistance   Gait / Transfers Assistance Needed: Household distances with [place]. Community with RW  ADL's / Homemaking Assistance Needed: Reports independence with all ADL's        Hand Dominance   Dominant Hand: Right    Extremity/Trunk Assessment   Upper Extremity Assessment Upper Extremity Assessment: Defer to OT evaluation LUE Sensation: WNL    Lower Extremity Assessment Lower Extremity Assessment: Generalized weakness    Cervical / Trunk Assessment Cervical / Trunk Assessment: Normal  Communication   Communication: HOH  Cognition Arousal/Alertness: Awake/alert Behavior During Therapy: WFL for tasks assessed/performed Overall Cognitive Status: Within Functional Limits for tasks assessed                                        General Comments General comments (skin integrity, edema, etc.): R foot wrapped.    Exercises Other Exercises Other Exercises: How to safely asc/desc single step  to enter/exit home with RW.   Assessment/Plan    PT Assessment Patient needs continued PT services  PT Problem List Decreased strength;Decreased activity tolerance;Decreased balance;Decreased mobility;Decreased knowledge of use of DME       PT Treatment Interventions DME instruction;Gait training;Stair training;Functional mobility training;Therapeutic activities;Therapeutic exercise;Balance training;Patient/family education    PT Goals (Current goals can be found in the Care Plan section)  Acute Rehab PT Goals Patient Stated Goal: to go home before birthday PT Goal Formulation: With patient Time For Goal Achievement: 06/19/21 Potential to Achieve Goals: Good    Frequency Min 2X/week   Barriers to discharge        Co-evaluation               AM-PAC PT "6 Clicks" Mobility  Outcome Measure Help needed turning from your back to your side while in a flat bed without using bedrails?: None Help needed moving from lying on your back to sitting on the side of a flat bed without using bedrails?: None Help needed moving to and from a bed to a chair (including a wheelchair)?: A Little Help needed standing up from a chair using your arms (e.g., wheelchair or bedside chair)?: None Help needed to walk in hospital room?: A Little Help needed climbing 3-5 steps with a railing? : A Little 6 Click Score: 21    End of Session Equipment Utilized During Treatment: Gait belt Activity Tolerance: Patient tolerated treatment  well;No increased pain Patient left: in chair Nurse Communication: Mobility status PT Visit Diagnosis: Other abnormalities of gait and mobility (R26.89);Muscle weakness (generalized) (M62.81)    Time: [id number] PT Time Calculation (min) (ACUTE ONLY): 29 min   Charges:   PT Evaluation $PT Eval Low Complexity: 1 Low PT Treatments $Gait Training: 8-22 mins $Therapeutic Activity: 8-22 mins       Keidan Aumiller M. Fairly IV, PT, DPT Physical Therapist- [facility]  06/05/2021, 10:31 AM

## 2021-06-05 NOTE — Progress Notes (Signed)
PROGRESS NOTE    Nathan Singleton  [id number] DOB: [birth year] DOA: 06/01/2021 PCP: Ranae Plumber, PA    Chief Complaint  Patient presents with   Foot Pain    Brief Narrative:    Nathan Singleton is a 80 y.o. 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  Last lower extremity angiography on 03/27/2021 by Dr. Lucky Cowboy showed patent stent and normal arteries other than chronically occluded posterior tibial artery.  Patient x-ray showing osteomyelitis, as well blood culture growing staph bacteremia.    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 -Patient empirically on IV vancomycin and Rocephin, now blood cultures BC ID D showing MSSA, antibiotic has been narrowed to cefazolin by ID. -TEE w/o vegetations - surg path in process, culture ngtd - 6/21 blood cultures ngtd - for picc placement today -Podiatry  consult greatly appreciated, s/p Amputation metatarsophalangeal joint right second toe by Dr. Vickki Muff 6/21  - pt advising home pt - plan for 4 wks abx per ID - possible d/c later today if picc can be placed and home infusion services started   Hypertension Controlled -Continue amlodipine Continue to hold Lasix for hospitalization last week due to syncope from hypotension   Chronic cardiovascular conditions S/pTAVR/PVD s/p stents/CAD s/p CABG/AAA ascending aorta 4 cm 11/2021/pacemaker Continue statins Continue Coreg, Imdur and amlodipine Cont aspirin and plavix  Type 2 diabetes mellitus with hyperlipidemia (HCC) Sugars in 200s Increase lantus from 18 to 22, cont ssi   COPD (chronic obstructive pulmonary disease) (HCC) - Stable without acute exacerbation - Continue home inhalers    Chronic pain Continue gabapentin  Depression - Continue sertraline-pt says that he was taken off of this during last admission but no documentation of this   Gout -continue allopurinol  DVT prophylaxis: Lovenox Code Status: Full Family Communication: wife updated @ bedside 6/23 Disposition:   Status is: Inpatient  Remains inpatient appropriate because:IV treatments appropriate due to intensity of illness or inability to take PO  Dispo: The patient is from: Home              Anticipated d/c is to: home              Patient currently is not medically stable to d/c.   Difficult to place patient No       Consultants:  Podiatry ID  Subjective:  He denies any chest pain, shortness of breath, currently denies any fever or chills. Toe pain controlled  Objective: Vitals:   06/04/21 2333 06/05/21 0448 06/05/21 0820 06/05/21 1150  BP: 122/75 (!) 153/69 (!) 153/68 119/61  Pulse: (!) 59 60 (!) 59 65  Resp: '17 20 18 18  '$ Temp: 98.4 F (36.9 C) 98.2 F (36.8 C) 98.6 F (37 C) 99 F (37.2 C)  TempSrc: Oral Oral    SpO2: 99% 100% 99% 99%  Weight:      Height:        Intake/Output  Summary (Last 24 hours) at 06/05/2021 1239 Last data filed at 06/04/2021 2336 Gross per 24 hour  Intake 680 ml  Output --  Net 680 ml   Filed Weights   06/01/21 1441  Weight: 100 kg    Examination:  Awake Alert, Oriented X 3, No new F.N deficits, Normal affect Symmetrical Chest wall movement, Good air movement bilaterally, CTAB RRR,No Gallops,Rubs or new Murmurs,  +ve B.Sounds, Abd Soft, No tenderness, No rebound - guarding or rigidity. No Cyanosis, right foot bandaged   Data Reviewed: I have personally reviewed following labs and imaging studies  CBC: Recent Labs  Lab 06/01/21 1452 06/03/21 0552  WBC 5.6 4.9  NEUTROABS 4.4  --   HGB 9.8* 8.8*  HCT 29.8* 26.4*  MCV 91.4 88.9  PLT 119* 115*    Basic Metabolic Panel: Recent Labs  Lab 06/01/21 1452 06/02/21 0524 06/03/21 0552  NA 134* 137 137  K 4.1 4.9 4.1  CL 107 106 107  CO2 '22 23 23  '$ GLUCOSE 171* 219* 87  BUN 35* 37* 34*  CREATININE 1.17 1.21 1.25*  CALCIUM 8.5* 8.6* 8.4*    GFR: Estimated Creatinine Clearance: 55.8 mL/min (A) (by C-G formula based on SCr of 1.25 mg/dL (H)).  Liver Function Tests: Recent Labs  Lab 06/01/21 1452  AST 36  ALT 28  ALKPHOS 83  BILITOT 0.6  PROT 7.8  ALBUMIN 3.7    CBG: Recent Labs  Lab 06/04/21 1646 06/04/21 2103 06/05/21 0213 06/05/21 0820 06/05/21 1151  GLUCAP 288* 120* 223* 110* 268*     Recent Results (from the past 240 hour(s))  Resp Panel by RT-PCR (Flu A&B, Covid) Nasopharyngeal Swab     Status: None   Collection Time: 06/01/21  6:18 PM   Specimen: Nasopharyngeal Swab; Nasopharyngeal(NP) swabs in vial transport medium  Result Value Ref Range Status   SARS Coronavirus 2 by RT PCR NEGATIVE NEGATIVE Final    Comment: (NOTE) SARS-CoV-2 target nucleic acids are NOT DETECTED.  The SARS-CoV-2 RNA is generally detectable in upper respiratory specimens during the acute phase of infection. The lowest concentration of SARS-CoV-2 viral copies this assay  can detect is 138 copies/mL. A negative result does not preclude SARS-Cov-2 infection and should not be used as the sole basis for treatment or other patient management decisions. A negative result may occur with  improper specimen collection/handling, submission of specimen other than nasopharyngeal swab, presence of viral mutation(s) within the areas targeted by this assay, and inadequate number of viral copies(<138 copies/mL). A negative result must be combined with clinical observations, patient history, and epidemiological information. The expected result is Negative.  Fact Sheet for Patients:  EntrepreneurPulse.com.au  Fact Sheet for Healthcare Providers:  IncredibleEmployment.be  This test is no t yet approved or cleared by the [place] FDA and  has been authorized for detection and/or diagnosis of SARS-CoV-2 by FDA under an Emergency Use Authorization (EUA). This EUA will remain  in effect (meaning this test can be used) for the duration of the COVID-19  declaration under Section 564(b)(1) of the Act, 21 U.S.C.section 360bbb-3(b)(1), unless the authorization is terminated  or revoked sooner.       Influenza A by PCR NEGATIVE NEGATIVE Final   Influenza B by PCR NEGATIVE NEGATIVE Final    Comment: (NOTE) The Xpert Xpress SARS-CoV-2/FLU/RSV plus assay is intended as an aid in the diagnosis of influenza from Nasopharyngeal swab specimens and should not be used as a sole basis for treatment. Nasal washings and aspirates are unacceptable for Xpert Xpress SARS-CoV-2/FLU/RSV testing.  Fact Sheet for Patients: EntrepreneurPulse.com.au  Fact Sheet for Healthcare Providers: IncredibleEmployment.be  This test is not yet approved or cleared by the [place] FDA and has been authorized for detection and/or diagnosis of SARS-CoV-2 by FDA under an Emergency Use Authorization (EUA). This EUA will  remain in effect (meaning this test can be used) for the duration of the COVID-19 declaration under Section 564(b)(1) of the Act, 21 U.S.C. section 360bbb-3(b)(1), unless the authorization is terminated or revoked.  Performed at [facility], [address] [place], [place] [address]   Culture, blood (Routine X 2) w Reflex to ID Panel     Status: Abnormal   Collection Time: 06/01/21  6:38 PM   Specimen: BLOOD  Result Value Ref Range Status   Specimen Description   Final    BLOOD BLOOD LEFT ARM Performed at [facility], [address] [place], [place] [address]    Special Requests   Final    BOTTLES DRAWN AEROBIC ONLY Blood Culture adequate volume Performed at [facility], [address] [place], [place] [address]    Culture  Setup Time   Final    GRAM POSITIVE COCCI AEROBIC BOTTLE ONLY CRITICAL VALUE NOTED.  VALUE IS CONSISTENT WITH PREVIOUSLY REPORTED AND CALLED VALUE. 06/02/21 [id number] SJL Performed at [facility], [address] [place], [place] [address]    Culture (A)  Final    STAPHYLOCOCCUS AUREUS SUSCEPTIBILITIES PERFORMED ON PREVIOUS CULTURE WITHIN THE LAST 5 DAYS. Performed at [facility], [place] [address], [place], [place] [address]    Report Status 06/04/2021 FINAL  Final  Culture, blood (Routine X 2) w Reflex to ID Panel     Status: Abnormal   Collection Time: 06/01/21  6:38 PM   Specimen: BLOOD  Result Value Ref Range Status   Specimen Description   Final    BLOOD BLOOD LEFT ARM Performed at [facility], [address], [place], [place] [address]    Special Requests   Final    BOTTLES DRAWN AEROBIC AND ANAEROBIC Blood Culture adequate volume Performed at [facility], [address] [place], [place] [address]    Culture  Setup Time   Final    Organism ID to follow GRAM POSITIVE COCCI IN BOTH AEROBIC AND ANAEROBIC BOTTLES CRITICAL RESULT CALLED TO, READ BACK BY AND VERIFIED WITH:  CHRISTINE THOMPSON 06/02/21 [id number] Performed at [facility], [address] [place], [place] [address]    Culture STAPHYLOCOCCUS AUREUS (A)  Final   Report Status 06/04/2021 FINAL  Final   Organism ID, Bacteria STAPHYLOCOCCUS AUREUS  Final      Susceptibility   Staphylococcus aureus - MIC*    CIPROFLOXACIN <=0.5 SENSITIVE Sensitive     ERYTHROMYCIN >=8 RESISTANT Resistant     GENTAMICIN <=0.5 SENSITIVE Sensitive     OXACILLIN 0.5 SENSITIVE Sensitive     TETRACYCLINE <=1 SENSITIVE Sensitive     VANCOMYCIN <=0.5 SENSITIVE Sensitive  TRIMETH/SULFA <=10 SENSITIVE Sensitive     CLINDAMYCIN RESISTANT Resistant     RIFAMPIN <=0.5 SENSITIVE Sensitive     Inducible Clindamycin POSITIVE Resistant     * STAPHYLOCOCCUS AUREUS  Blood Culture ID Panel (Reflexed)     Status: Abnormal   Collection Time: 06/01/21  6:38 PM  Result Value Ref Range Status   Enterococcus faecalis NOT DETECTED NOT DETECTED Final   Enterococcus Faecium NOT DETECTED NOT DETECTED Final   Listeria monocytogenes NOT DETECTED NOT DETECTED Final   Staphylococcus species DETECTED (A) NOT DETECTED Final    Comment: CRITICAL RESULT CALLED TO, READ BACK BY AND VERIFIED WITH: CHRISTINE THOMPSON, PHARMD AT 1000 ON 06/02/21 BY SJL    Staphylococcus aureus (BCID) DETECTED (A) NOT DETECTED Final    Comment: CRITICAL RESULT CALLED TO, READ BACK BY AND VERIFIED WITH: CHRISTINE THOMPSON, PHARMD AT 1000 ON 06/02/21 BY SJL    Staphylococcus epidermidis NOT DETECTED NOT DETECTED Final   Staphylococcus lugdunensis NOT DETECTED NOT DETECTED Final   Streptococcus species NOT DETECTED NOT DETECTED Final   Streptococcus agalactiae NOT DETECTED NOT DETECTED Final   Streptococcus pneumoniae NOT DETECTED NOT DETECTED Final   Streptococcus pyogenes NOT DETECTED NOT DETECTED Final   A.calcoaceticus-baumannii NOT DETECTED NOT DETECTED Final   Bacteroides fragilis NOT DETECTED NOT DETECTED Final   Enterobacterales NOT DETECTED  NOT DETECTED Final   Enterobacter cloacae complex NOT DETECTED NOT DETECTED Final   Escherichia coli NOT DETECTED NOT DETECTED Final   Klebsiella aerogenes NOT DETECTED NOT DETECTED Final   Klebsiella oxytoca NOT DETECTED NOT DETECTED Final   Klebsiella pneumoniae NOT DETECTED NOT DETECTED Final   Proteus species NOT DETECTED NOT DETECTED Final   Salmonella species NOT DETECTED NOT DETECTED Final   Serratia marcescens NOT DETECTED NOT DETECTED Final   Haemophilus influenzae NOT DETECTED NOT DETECTED Final   Neisseria meningitidis NOT DETECTED NOT DETECTED Final   Pseudomonas aeruginosa NOT DETECTED NOT DETECTED Final   Stenotrophomonas maltophilia NOT DETECTED NOT DETECTED Final   Candida albicans NOT DETECTED NOT DETECTED Final   Candida auris NOT DETECTED NOT DETECTED Final   Candida glabrata NOT DETECTED NOT DETECTED Final   Candida krusei NOT DETECTED NOT DETECTED Final   Candida parapsilosis NOT DETECTED NOT DETECTED Final   Candida tropicalis NOT DETECTED NOT DETECTED Final   Cryptococcus neoformans/gattii NOT DETECTED NOT DETECTED Final   Meth resistant mecA/C and MREJ NOT DETECTED NOT DETECTED Final    Comment: Performed at [facility], [address] [place], [place] [address]  Aerobic/Anaerobic Culture w Gram Stain (surgical/deep wound)     Status: None (Preliminary result)   Collection Time: 06/02/21 12:41 PM   Specimen: Wound  Result Value Ref Range Status   Specimen Description   Final    WOUND Performed at [facility], [address] [place], [place] [address]    Special Requests   Final    NONE Performed at [facility], [address] [place], [place] [address]    Gram Stain   Final    RARE WBC PRESENT,BOTH PMN AND MONONUCLEAR FEW GRAM POSITIVE COCCI IN PAIRS IN CLUSTERS Performed at [facility], [address] [place], [place] [address]    Culture   Final    ABUNDANT STAPHYLOCOCCUS AUREUS ABUNDANT CORYNEBACTERIUM  SPECIES Standardized susceptibility testing for this organism is not available. NO ANAEROBES ISOLATED; CULTURE IN PROGRESS FOR 5 DAYS    Report Status PENDING  Incomplete   Organism ID, Bacteria  STAPHYLOCOCCUS AUREUS  Final      Susceptibility   Staphylococcus aureus - MIC*    CIPROFLOXACIN <=0.5 SENSITIVE Sensitive     ERYTHROMYCIN >=8 RESISTANT Resistant     GENTAMICIN <=0.5 SENSITIVE Sensitive     OXACILLIN 0.5 SENSITIVE Sensitive     TETRACYCLINE <=1 SENSITIVE Sensitive     VANCOMYCIN <=0.5 SENSITIVE Sensitive     TRIMETH/SULFA <=10 SENSITIVE Sensitive     CLINDAMYCIN RESISTANT Resistant     RIFAMPIN <=0.5 SENSITIVE Sensitive     Inducible Clindamycin POSITIVE Resistant     * ABUNDANT STAPHYLOCOCCUS AUREUS  Surgical pcr screen     Status: Abnormal   Collection Time: 06/02/21 11:34 PM   Specimen: Nasal Mucosa; Nasal Swab  Result Value Ref Range Status   MRSA, PCR NEGATIVE NEGATIVE Final   Staphylococcus aureus POSITIVE (A) NEGATIVE Final    Comment: (NOTE) The Xpert SA Assay (FDA approved for NASAL specimens in patients 14 years of age and older), is one component of a comprehensive surveillance program. It is not intended to diagnose infection nor to guide or monitor treatment. Performed at [facility], [address] [place], [place] [address]   CULTURE, BLOOD (ROUTINE X 2) w Reflex to ID Panel     Status: None (Preliminary result)   Collection Time: 06/03/21  7:01 AM   Specimen: BLOOD  Result Value Ref Range Status   Specimen Description BLOOD RIGHT HAND  Final   Special Requests   Final    BOTTLES DRAWN AEROBIC AND ANAEROBIC Blood Culture adequate volume   Culture   Final    NO GROWTH 2 DAYS Performed at [facility], [address], [place], [place] [address]    Report Status PENDING  Incomplete  CULTURE, BLOOD (ROUTINE X 2) w Reflex to ID Panel     Status: None (Preliminary result)   Collection Time: 06/03/21  7:08 AM   Specimen: BLOOD   Result Value Ref Range Status   Specimen Description BLOOD LEFT HAND  Final   Special Requests   Final    BOTTLES DRAWN AEROBIC AND ANAEROBIC Blood Culture results may not be optimal due to an excessive volume of blood received in culture bottles   Culture   Final    NO GROWTH 2 DAYS Performed at [facility], [address], [place], [place] [address]    Report Status PENDING  Incomplete  Aerobic/Anaerobic Culture w Gram Stain (surgical/deep wound)     Status: None (Preliminary result)   Collection Time: 06/03/21  1:35 PM   Specimen: PATH Digit amputation; Tissue  Result Value Ref Range Status   Specimen Description   Final    TISSUE Performed at [facility], [address], [place], [place] [address]    Special Requests   Final    RIGHT SECOND TOE CULTURE Performed at [facility], [address] [place], [place] [address]    Gram Stain   Final    FEW WBC PRESENT, PREDOMINANTLY PMN FEW GRAM POSITIVE COCCI IN PAIRS IN CLUSTERS    Culture   Final    RARE STAPHYLOCOCCUS AUREUS SUSCEPTIBILITIES TO FOLLOW Performed at [facility], [place] [address], [place],  [address]    Report Status PENDING  Incomplete         Radiology Studies: ECHO TEE  Result Date: 06/04/2021    TRANSESOPHOGEAL ECHO REPORT   Patient Name:   LEROI PILSON Date of Exam: 06/04/2021 Medical Rec #:  [id number]      Height:       68.0 in Accession #:    [id number]     Weight:       220.5 lb Date of Birth:  [birth year]      BSA:          2.130 m Patient Age:    5 years       BP:           157/70 mmHg Patient Gender: M              HR:           60 bpm. Exam Location:  [facility] Procedure: Transesophageal Echo, Color Doppler and Cardiac Doppler Indications:     R78.81 Bacteremia  History:         Patient has prior history of Echocardiogram examinations, most                  recent 03/26/2021. Signs/Symptoms:Bacteremia.  Sonographer:     Charmayne Sheer RDCS (AE) Referring Phys:   [id number] JUSTIN FOWLER Diagnosing Phys: Kate Sable MD PROCEDURE: The transesophogeal probe was passed without difficulty through the esophogus of the patient. Sedation performed by performing physician. The patient developed no complications during the procedure. IMPRESSIONS  1. Left ventricular ejection fraction, by estimation, is 60%. The left ventricle has normal function.  2. Right ventricular systolic function is normal. The right ventricular size is normal.  3. Left atrial size was mildly dilated. No left atrial/left atrial appendage thrombus was detected.  4. The mitral valve is normal in structure. Mild mitral valve regurgitation.  5. The aortic valve has been repaired/replaced. Aortic valve regurgitation is not visualized. Echo findings are consistent with normal structure and function of the aortic valve prosthesis. Conclusion(s)/Recommendation(s): There is a small 5 x 3 mm calcified/fibrinous structure in the right atrium (image clip 31), attached to the pacemaker lead. This was present on prior TEecho 2 months ago. likely represents prior calcified vegetation vs thrombus. no acute vegetation or endocarditis noted on this TEE.  FINDINGS  Left Ventricle: Left ventricular ejection fraction, by estimation, is 60%. The left ventricle has normal function. The left ventricular internal cavity size was normal in size. Right Ventricle: The right ventricular size is normal. No increase in right ventricular wall thickness. Right ventricular systolic function is normal. Left Atrium: Left atrial size was mildly dilated. No left atrial/left atrial appendage thrombus was detected. Right Atrium: Right atrial size was normal in size. Pericardium: There is no evidence of pericardial effusion. Mitral Valve: The mitral valve is normal in structure. Mild mitral valve regurgitation. Tricuspid Valve: The tricuspid valve is normal in structure. Tricuspid valve regurgitation is trivial. Aortic Valve: The aortic valve has  been repaired/replaced. Aortic valve regurgitation is not visualized. There is a Sapien prosthetic, stented (TAVR) valve present in the aortic position. Echo findings are consistent with normal structure and function of  the aortic valve prosthesis. Pulmonic Valve: The pulmonic valve was grossly normal. Pulmonic valve regurgitation is not visualized. Aorta: The aortic root is normal in size and structure. IAS/Shunts: No atrial level shunt detected by color flow Doppler. Kate Sable MD Electronically signed by Kate Sable MD Signature Date/Time: 06/04/2021/2:58:51 PM    Final    [place] EKG SITE RITE  Result Date: 06/05/2021 If Site Rite image not attached, placement could not be confirmed due to current cardiac rhythm.       Scheduled Meds:  allopurinol  100 mg Oral Daily  amLODipine  10 mg Oral Daily   aspirin EC  81 mg Oral Daily   carvedilol  3.125 mg Oral BID   Chlorhexidine Gluconate Cloth  6 each Topical Q0600   clopidogrel  75 mg Oral Daily   enoxaparin (LOVENOX) injection  0.5 mg/kg Subcutaneous Q24H   gabapentin  100 mg Oral Daily   insulin aspart  0-15 Units Subcutaneous TID PC,HS,0200   insulin glargine  18 Units Subcutaneous QHS   isosorbide mononitrate  30 mg Oral Daily   mupirocin ointment  1 application Nasal BID   pantoprazole  40 mg Oral Daily   rosuvastatin  40 mg Oral Daily   sertraline  200 mg Oral Daily   Continuous Infusions:   ceFAZolin (ANCEF) IV 2 g (06/05/21 0517)     LOS: 4 days      Desma Maxim, MD [facility]   To contact the attending provider between 7A-7P or the covering provider during after hours 7P-7A, please log into the web site [email or web address] and access using universal [place] password for that web site. If you do not have the password, please call the hospital operator.  06/05/2021, 12:39 PM

## 2021-06-05 NOTE — Evaluation (Signed)
Occupational Therapy Evaluation Patient Details Name: Nathan Singleton MRN: [id number] DOB: [birth year] Today's Date: 06/05/2021    History of Present Illness Sham Christlieb is a 80 y.o. male with medical history significant for DM, HTN, PVD, pacemaker, aortic stenosis s/p TAVR, CAD status post CABG, ascending aortic aneurysm 4 cm December 2021, chronic pain, chronic anemia, bipolar and chronic thrombocytopenia who presents to the emergency room for R foot pain and bleeding from 2nd digit. 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 Received R popliteal stent with RLE angiography on 03/27/21 with chronic occluded post tib artery. 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   Clinical Impression   Pt was seen for OT evaluation this date. Spouse present for session. Pt lives with spouse who is available to assist as needed upon discharge. Currently pt demonstrates impairments in strength, R foot pain, and balance as described below (See OT problem list) which functionally limit his ability to perform ADL/self-care tasks. Pt currently requires PRN MIN A for LB ADL and requires assist from spouse at baseline for tub transfers. Pt/spouse educated in DME options for the bathroom to facilitate improved tub transfer safety. Pt would benefit from skilled OT services to address noted impairments and functional limitations (see below for any additional details) in order to maximize safety and independence while minimizing falls risk and caregiver burden. Upon hospital discharge, recommend HHOT to maximize pt safety and return to functional independence during meaningful occupations of daily life.     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      Mobility Bed Mobility Overal bed mobility: Modified Independent Bed Mobility: Supine to Sit     Supine to sit: Modified independent (Device/Increase time)     General bed mobility comments: NT, up in recliner    Transfers Overall transfer level: Needs assistance Equipment used: Rolling walker (2 wheeled) Transfers: Sit to/from Stand Sit to Stand: Modified independent (Device/Increase time)         General transfer comment: declines, recently up with PT    Balance Overall balance assessment: Needs assistance Sitting-balance support: No upper extremity supported;Feet supported Sitting balance-Leahy Scale: Normal     Standing balance support: Bilateral upper extremity supported;During functional activity Standing balance-Leahy Scale: Good Standing balance comment: Able to walk in room with no AD to recliner maintaining RLE precautions (5')                           ADL either performed or assessed with clinical judgement   ADL Overall ADL's : Needs assistance/impaired                       Lower Body Dressing Details (indicate cue type and reason): PRN MIN A               General ADL Comments: spouse able to provide needed level of assist, baseline assist for tub shower transfers at home     Vision         Perception     Praxis      Pertinent Vitals/Pain Pain Assessment: 0-10 Pain Score:  3  Pain Location: RLE surgical site Pain Descriptors / Indicators: Discomfort Pain Intervention(s): Limited activity within patient's tolerance;Monitored during session;Premedicated before session;Repositioned     Hand Dominance Right   Extremity/Trunk Assessment Upper Extremity Assessment Upper Extremity Assessment: LUE deficits/detail LUE Deficits / Details: Since fall a few months ago, limted AROM of LUE, grip strength WFL LUE  Sensation: WNL   Lower Extremity Assessment Lower Extremity Assessment: Generalized weakness   Cervical / Trunk Assessment Cervical / Trunk Assessment: Normal   Communication Communication Communication: HOH   Cognition Arousal/Alertness: Awake/alert Behavior During Therapy: WFL for tasks assessed/performed Overall Cognitive Status: Within Functional Limits for tasks assessed                                     General Comments  R foot wrapped.    Exercises Other Exercises Other Exercises: How to safely asc/desc single step to enter/exit home with RW. Other Exercises: OT educated pt/spouse in shower chair options that might work for narrow tub at home, spouse reports a tub transfer bench would not work given towel rod placement that would make pt hit his head   Shoulder Instructions      Home Living Family/patient expects to be discharged to:: Private residence Living Arrangements: Spouse/significant other Available Help at Discharge: Family;Available 24 hours/day Type of Home: House Home Access: Stairs to enter [employer] of Steps: single step to enter home Entrance Stairs-Rails: None Home Layout: One level     Bathroom Shower/Tub: Teacher, early years/pre: Standard     Home Equipment: Environmental consultant - 2 wheels;Cane - single point   Additional Comments: Reports new RW he received last hospitalization is too narrow. Brought in his older RW.      Prior Functioning/Environment Level of Independence: Independent with assistive device(s);Needs assistance  Gait / Transfers Assistance Needed: Household distances with [place]. Community with RW ADL's / Homemaking Assistance Needed: Reports independence with all ADL's Communication / Swallowing Assistance Needed: HoH          OT Problem List: Decreased strength;Decreased activity tolerance;Impaired balance (sitting and/or standing)      OT Treatment/Interventions: Self-care/ADL  training;Therapeutic exercise;Energy conservation;DME and/or AE instruction;Therapeutic activities;Patient/family education;Balance training    OT Goals(Current goals can be found in the care plan section) Acute Rehab OT Goals Patient Stated Goal: to go home OT Goal Formulation: With patient/family Time For Goal Achievement: 06/19/21 Potential to Achieve Goals: Good ADL Goals Pt Will Perform Tub/Shower Transfer: with supervision;ambulating;shower seat;rolling walker Additional ADL Goal #1: Pt will verbalize plan to implement at least 1 learned falls prevention strategy  OT Frequency: Min 1X/week   Barriers to D/C:            Co-evaluation              AM-PAC OT "6 Clicks" Daily Activity     Outcome Measure Help from another person eating meals?: None Help from another person taking care of personal grooming?: None Help from another person toileting, which includes using toliet, bedpan, or urinal?: A Little Help from another person bathing (including washing, rinsing, drying)?: A Little Help from another person to put on and taking off regular upper body clothing?: None Help from another person to put on and taking off regular lower body clothing?: A Little 6 Click Score: 21   End of Session    Activity Tolerance: Patient tolerated treatment well Patient left: in chair;with  call bell/phone within reach;with chair alarm set;with family/visitor present;with nursing/sitter in room;Other (comment) (R postop shoe on)  OT Visit Diagnosis: Unsteadiness on feet (R26.81);Muscle weakness (generalized) (M62.81)                Time: [id number] OT Time Calculation (min): 13 min Charges:  OT General Charges $OT Visit: 1 Visit OT Evaluation $OT Eval Low Complexity: 1 Low  Hanley Hays, MPH, MS, OTR/L ascom [phone] 06/05/21, 1:49 PM

## 2021-06-05 NOTE — Treatment Plan (Signed)
Diagnosis: MSSA ( staphylococcus aureus bacteremia)bacteremia Osteomyelitis toe Baseline Creatinine 1.29   Allergies  Allergen Reactions   Atorvastatin Other (See Comments)    "bad for kidneys" Cramping    Celecoxib Other (See Comments)    Other reaction(s): Other (See Comments) Kidney Problem Kidney Problem    Dicyclomine Hcl Other (See Comments)    Stomach cramps   Doxycycline Other (See Comments)    Per wife (per RN)- pt gets severe stomach pains even when takes with food and PCP said to not take     Metformin Other (See Comments)    OPAT Orders Discharge antibiotics: Cefazolin 2 grams IV every 8 hours End Date: 06/30/21  [facility] Care Per Protocol:including placement of biopatch  Labs weekly on Monday while on IV antibiotics: __ CBC with differential __ CMP   _X_ Please pull PIC at completion of IV antibiotics   Fax weekly labs promptly to [phone]  Clinic Follow Up Appt: 06/19/21   Call [phone] if any questions or concerns

## 2021-06-05 NOTE — Progress Notes (Signed)
Date of Admission:  06/01/2021     ID: Nathan Singleton is a 80 y.o. 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   COPD (chronic obstructive pulmonary disease) (HCC)   Depression   Gout   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HCC)   Bacteremia    Feeling better No pain No fever N diarrhea No cough  Medications:   allopurinol  100 mg Oral Daily   amLODipine  10 mg Oral Daily   aspirin EC  81 mg Oral Daily   carvedilol  3.125 mg Oral BID   Chlorhexidine Gluconate Cloth  6 each Topical Q0600   clopidogrel  75 mg Oral Daily   enoxaparin (LOVENOX) injection  0.5 mg/kg Subcutaneous Q24H   gabapentin  100 mg Oral Daily   insulin aspart  0-15 Units Subcutaneous TID PC,HS,0200   insulin glargine  22 Units Subcutaneous QHS   isosorbide mononitrate  30 mg Oral Daily   mupirocin ointment  1 application Nasal BID   pantoprazole  40 mg Oral Daily   rosuvastatin  40 mg Oral Daily   sertraline  200 mg Oral Daily    Objective: Vital signs in last 24 hours: Temp:  [98.2 F (36.8 C)-99 F (37.2 C)] 99 F (37.2 C) (06/23 1150) Pulse Rate:  [59-68] 65 (06/23 1150) Resp:  [16-20] 18 (06/23 1150) BP: (116-153)/(61-75) 119/61 (06/23 1150) SpO2:  [98 %-100 %] 99 % (06/23 1150)  PHYSICAL EXAM:  General: Alert, cooperative, no distress, appears stated age.  Lungs: Clear to auscultation bilaterally. No Wheezing or Rhonchi. No rales. Heart: Regular rate and rhythm, no murmur, rub or gallop. Abdomen: Soft, non-tender,not distended. Bowel sounds normal. No masses Extremities: rt foot dressing not removed Skin: No rashes or lesions. Or bruising Lymph: Cervical, supraclavicular  normal. Neurologic: Grossly non-focal  Lab Results Recent Labs    06/03/21 0552 06/05/21 1309  WBC 4.9 3.2*  HGB 8.8* 8.9*  HCT 26.4* 26.6*  NA 137 133*  K 4.1 4.9  CL 107 104  CO2 23 22  BUN 34* 37*  CREATININE 1.25* 1.28*   Liver Panel No results for input(s): PROT, ALBUMIN, AST, ALT, ALKPHOS, BILITOT, BILIDIR, IBILI in the last 72 hours. Sedimentation Rate Recent Labs    06/05/21 0426  ESRSEDRATE 107*   C-Reactive Protein Recent Labs    06/05/21 0426  CRP 7.7*    Microbiology:  Studies/Results: ECHO TEE  Result Date: 06/04/2021    TRANSESOPHOGEAL ECHO REPORT   Patient Name:   Nathan Singleton Date of Exam: 06/04/2021 Medical Rec #:  [id number]      Height:       68.0 in Accession #:    [id number]     Weight:       220.5 lb Date of Birth:  1941-09-26      BSA:          2.130 m Patient Age:    31 years       BP:           157/70 mmHg Patient Gender: M              HR:  60 bpm. Exam Location:  [facility] Procedure: Transesophageal Echo, Color Doppler and Cardiac Doppler Indications:     R78.81 Bacteremia  History:         Patient has prior history of Echocardiogram examinations, most                  recent 03/26/2021. Signs/Symptoms:Bacteremia.  Sonographer:     Charmayne Sheer RDCS (AE) Referring Phys:  [id number] Nathan Singleton Diagnosing Phys: Nathan Sable MD PROCEDURE: The transesophogeal probe was passed without difficulty through the esophogus of the patient. Sedation performed by performing physician. The patient developed no complications during the procedure. IMPRESSIONS  1. Left ventricular ejection fraction, by estimation, is 60%. The left ventricle has normal function.  2. Right ventricular systolic function is normal. The right ventricular size is normal.  3. Left atrial size was mildly dilated. No left atrial/left atrial appendage thrombus was detected.  4. The mitral valve is normal in structure. Mild mitral valve regurgitation.  5. The aortic valve has been  repaired/replaced. Aortic valve regurgitation is not visualized. Echo findings are consistent with normal structure and function of the aortic valve prosthesis. Conclusion(s)/Recommendation(s): There is a small 5 x 3 mm calcified/fibrinous structure in the right atrium (image clip 31), attached to the pacemaker lead. This was present on prior TEecho 2 months ago. likely represents prior calcified vegetation vs thrombus. no acute vegetation or endocarditis noted on this TEE.  FINDINGS  Left Ventricle: Left ventricular ejection fraction, by estimation, is 60%. The left ventricle has normal function. The left ventricular internal cavity size was normal in size. Right Ventricle: The right ventricular size is normal. No increase in right ventricular wall thickness. Right ventricular systolic function is normal. Left Atrium: Left atrial size was mildly dilated. No left atrial/left atrial appendage thrombus was detected. Right Atrium: Right atrial size was normal in size. Pericardium: There is no evidence of pericardial effusion. Mitral Valve: The mitral valve is normal in structure. Mild mitral valve regurgitation. Tricuspid Valve: The tricuspid valve is normal in structure. Tricuspid valve regurgitation is trivial. Aortic Valve: The aortic valve has been repaired/replaced. Aortic valve regurgitation is not visualized. There is a Sapien prosthetic, stented (TAVR) valve present in the aortic position. Echo findings are consistent with normal structure and function of  the aortic valve prosthesis. Pulmonic Valve: The pulmonic valve was grossly normal. Pulmonic valve regurgitation is not visualized. Aorta: The aortic root is normal in size and structure. IAS/Shunts: No atrial level shunt detected by color flow Doppler. Nathan Sable MD Electronically signed by Nathan Sable MD Signature Date/Time: 06/04/2021/2:58:51 PM    Final    [place] EKG SITE RITE  Result Date: 06/05/2021 If Site Rite image not attached, placement  could not be confirmed due to current cardiac rhythm.    Assessment/Plan: MSSA bacteremia.  Secondary to the foot wound. Osteomyelitis of the second toe.  Status post amputation. Has a TAVR and pacemaker hence TEE was also done and did not show any valvular vegetation.  There was a calcified lesion on the pacemaker wire was seen on the prior TEE in April 2022.  Pt will need cefazolin for 4 weeks Will follow him as OP Discussed the management with him

## 2021-06-05 NOTE — Plan of Care (Signed)

## 2021-06-05 NOTE — Progress Notes (Signed)
PHARMACY CONSULT NOTE FOR:  OUTPATIENT  PARENTERAL ANTIBIOTIC THERAPY (OPAT)  Indication: MSSA bacteremia with toe osteomyelitis Regimen: cefazolin 2gm IV q8h End date: 06/30/2021  IV antibiotic discharge orders are pended. To discharging provider:  please sign these orders via discharge navigator,  Select New Orders & click on the button choice - Manage This Unsigned Work.     Thank you for allowing pharmacy to be a part of this patient's care.  Doreene Eland, PharmD, BCPS.   Work Cell: [phone] 06/05/2021 2:23 PM

## 2021-06-05 NOTE — Progress Notes (Signed)
Peripherally Inserted Central Catheter Placement  The IV Nurse has discussed with the patient and/or persons authorized to consent for the patient, the purpose of this procedure and the potential benefits and risks involved with this procedure.  The benefits include less needle sticks, lab draws from the catheter, and the patient may be discharged home with the catheter. Risks include, but not limited to, infection, bleeding, blood clot (thrombus formation), and puncture of an artery; nerve damage and irregular heartbeat and possibility to perform a PICC exchange if needed/ordered by physician.  Alternatives to this procedure were also discussed.  Bard Power PICC patient education guide, fact sheet on infection prevention and patient information card has been provided to patient /or left at bedside.    PICC Placement Documentation  PICC Single Lumen 06/05/21 Right Brachial 41 cm 0 cm (Active)  Indication for Insertion or Continuance of Line Home intravenous therapies (PICC only) 06/05/21 1522  Exposed Catheter (cm) 0 cm 06/05/21 1522  Site Assessment Dry;Clean;Intact 06/05/21 1522  Line Status Flushed;Saline locked;Blood return noted 06/05/21 1522  Dressing Type Transparent 06/05/21 1522  Dressing Status Clean;Dry;Intact 06/05/21 1522  Antimicrobial disc in place? Yes 06/05/21 [place] Not Applicable [id number]  Line Care Connections checked and tightened 06/05/21 1522  Line Adjustment (NICU/IV Team Only) No 06/05/21 1522  Dressing Intervention New dressing 06/05/21 1522  Dressing Change Due 06/12/21 06/05/21 1522       Rolena Infante 06/05/2021, 3:23 PM

## 2021-06-05 NOTE — TOC Progression Note (Signed)
Transition of Care [facility]) - Progression Note    Patient Details  Name: Nathan Singleton MRN: [id number] Date of Birth: 1941/03/27  Transition of Care [facility] CM/SW Contact  Su Hilt, RN Phone Number: 06/05/2021, 4:19 PM  Clinical Narrative:     Damaris Schooner with Clarise Cruz at [facility], they are already open with the patient for [facility], She is aware that he will DC tomorrow       Expected Discharge Plan and Services                                                 Social Determinants of Health (SDOH) Interventions    Readmission Risk Interventions Readmission Risk Prevention Plan 05/07/2021 03/23/2021 02/14/2021  Transportation Screening Complete Complete Complete  Medication Review Press photographer) Complete Complete Complete  PCP or Specialist appointment within 3-5 days of discharge Complete Complete Complete  HRI or Home Care Consult Complete Complete Complete  SW Recovery Care/Counseling Consult Complete Complete Complete  Palliative Care Screening Not Applicable Not Applicable Complete  [place] Not Applicable Not Applicable Complete

## 2021-06-06 LAB — CBC
HCT: 27.4 % — ABNORMAL LOW (ref 39.0–52.0)
Hemoglobin: 8.9 g/dL — ABNORMAL LOW (ref 13.0–17.0)
MCH: 29.2 pg (ref 26.0–34.0)
MCHC: 32.5 g/dL (ref 30.0–36.0)
MCV: 89.8 fL (ref 80.0–100.0)
Platelets: 132 10*3/uL — ABNORMAL LOW (ref 150–400)
RBC: 3.05 MIL/uL — ABNORMAL LOW (ref 4.22–5.81)
RDW: 14.1 % (ref 11.5–15.5)
WBC: 3.5 10*3/uL — ABNORMAL LOW (ref 4.0–10.5)
nRBC: 0 % (ref 0.0–0.2)

## 2021-06-06 LAB — BASIC METABOLIC PANEL
Anion gap: 3 — ABNORMAL LOW (ref 5–15)
BUN: 36 mg/dL — ABNORMAL HIGH (ref 8–23)
CO2: 26 mmol/L (ref 22–32)
Calcium: 8.7 mg/dL — ABNORMAL LOW (ref 8.9–10.3)
Chloride: 109 mmol/L (ref 98–111)
Creatinine, Ser: 1.17 mg/dL (ref 0.61–1.24)
GFR, Estimated: >60 mL/min (ref >60)
Glucose, Bld: 60 mg/dL — ABNORMAL LOW (ref 70–99)
Potassium: 4.4 mmol/L (ref 3.5–5.1)
Sodium: 138 mmol/L (ref 135–145)

## 2021-06-06 LAB — GLUCOSE, CAPILLARY
Glucose-Capillary: 131 mg/dL — ABNORMAL HIGH (ref 70–99)
Glucose-Capillary: 210 mg/dL — ABNORMAL HIGH (ref 70–99)
Glucose-Capillary: 332 mg/dL — ABNORMAL HIGH (ref 70–99)

## 2021-06-06 LAB — SURGICAL PATHOLOGY

## 2021-06-06 MED ORDER — CEFAZOLIN IV (FOR PTA / DISCHARGE USE ONLY): 2.0000 g | Freq: Three times a day (TID) | INTRAVENOUS | 0 refills | Status: AC

## 2021-06-06 NOTE — Progress Notes (Signed)
Physical Therapy Treatment Patient Details Name: Nathan Singleton MRN: [id number] DOB: [birth year] Today's Date: 06/06/2021    History of Present Illness Nathan Singleton is a 80 y.o. male with medical history significant for DM, HTN, PVD, pacemaker, aortic stenosis s/p TAVR, CAD status post CABG, ascending aortic aneurysm 4 cm December 2021, chronic pain, chronic anemia, bipolar and chronic thrombocytopenia who presents to the emergency room for R foot pain and bleeding from 2nd digit. 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 Received R popliteal stent with RLE angiography on 03/27/21 with chronic occluded post tib artery. 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    PT Comments    Pt received seated in recliner with RN present. Agreeable to PT services. With RW use, pt Mod-I for all transfers and mobility. Pt displays safe technique asc/desc single step to demo safely entering/exiting home with appropriate LE sequencing and no LoB noted. CGA provided for safety due to narrow platform step. Able to amb ~250' with changes in gait speed and horizontal/vertical head turns with no LoB, however does display difficulty adequately speeding up with acceleration most likely limited due to surgical shoe and maintaining WBAT heel strike precautions. Pt returned to recliner in room. Educated to continue performance of standing with UE support and seated LE therex for strengthening. D/c recs remain appropriate to further progress pt's safety at home with returning to walking with no AD for at home distances, (pt remains unsteady without RW within hospital room) and return strength in RLE s/p R toe amputation. Pt has no further PT needs in current venue of care.     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    Mobility  Bed Mobility               General bed mobility comments: Pt received d returned to recliner.    Transfers Overall transfer level: Needs assistance Equipment used: Rolling walker (2 wheeled) Transfers: Sit to/from Stand Sit to Stand: Modified independent (Device/Increase time)         General transfer comment: Relies on UE support to stand from recliner.  Ambulation/Gait Ambulation/Gait assistance: Modified independent (Device/Increase time) Gait Distance (Feet): 350 Feet Assistive device: Rolling walker (2 wheeled) Gait Pattern/deviations: Step-through pattern;Decreased stance time - right Gait velocity: Able to incresed/decrease gait speed.   General Gait Details: steady ambulation with RW use. Able to increase/decrease speed, perform horizontal/vertical head turns with RW and no LOB.   Stairs       Number of Stairs: 1 General stair comments: Able to indep asc/desc step with correct LE squencing with no VC's or demo from PT.   Wheelchair Mobility    Modified Rankin (Stroke Patients Only)       Balance Overall balance assessment: Needs assistance Sitting-balance support: No upper extremity supported;Feet supported Sitting balance-Leahy Scale: Normal     Standing balance support: Bilateral upper extremity supported;During functional activity Standing balance-Leahy Scale: Good Standing balance comment: Able to walk in room with no AD to recliner maintaining RLE precautions (5')             High level balance activites: Head  turns High Level Balance Comments: Able to change speed with RW (fast <> slow), perform vertical/horizontal head turns            Cognition Arousal/Alertness: Awake/alert Behavior During Therapy: WFL for tasks  assessed/performed Overall Cognitive Status: Within Functional Limits for tasks assessed                                        Exercises Other Exercises Other Exercises: How to safely asc/desc single step to enter/exit home with RW. Other Exercises: Educated to maintain general LE standing (UE support for safety)and seated therex for strengthening.    General Comments General comments (skin integrity, edema, etc.): R foot wrapped and donned surgical shoe.      Pertinent Vitals/Pain Pain Assessment: 0-10 Pain Score: 4  Pain Location: RLE surgical site Pain Descriptors / Indicators: Discomfort Pain Intervention(s): Limited activity within patient's tolerance;Monitored during session;Repositioned    Home Living                      Prior Function            PT Goals (current goals can now be found in the care plan section) Acute Rehab PT Goals Patient Stated Goal: to go home PT Goal Formulation: With patient Time For Goal Achievement: 06/19/21 Potential to Achieve Goals: Good Progress towards PT goals: Progressing toward goals    Frequency    Min 2X/week      PT Plan Current plan remains appropriate    Co-evaluation              AM-PAC PT "6 Clicks" Mobility   Outcome Measure  Help needed turning from your back to your side while in a flat bed without using bedrails?: None Help needed moving from lying on your back to sitting on the side of a flat bed without using bedrails?: None Help needed moving to and from a bed to a chair (including a wheelchair)?: A Little Help needed standing up from a chair using your arms (e.g., wheelchair or bedside chair)?: None Help needed to walk in hospital room?: A Little Help needed climbing 3-5 steps with a railing? : A Little 6 Click Score: 21    End of Session Equipment Utilized During Treatment: Gait belt Activity Tolerance: Patient tolerated treatment well;No increased pain Patient left:  in chair;with nursing/sitter in room;with call bell/phone within reach Nurse Communication: Mobility status PT Visit Diagnosis: Other abnormalities of gait and mobility (R26.89);Muscle weakness (generalized) (M62.81)     Time: [id number] PT Time Calculation (min) (ACUTE ONLY): 12 min  Charges:  $Therapeutic Activity: 8-22 mins                     Salem Caster. Fairly IV, PT, DPT Physical Therapist- [facility]  06/06/2021, 10:27 AM

## 2021-06-06 NOTE — Plan of Care (Signed)

## 2021-06-06 NOTE — Care Management Important Message (Signed)
Important Message  Patient Details  Name: Nathan Singleton MRN: [id number] Date of Birth: Jan 25, 1941   Medicare Important Message Given:  Yes     Dannette Barbara 06/06/2021, 10:46 AM

## 2021-06-06 NOTE — Plan of Care (Signed)
Patient discharged per MD orders at this time.All discharge instructions,education and medications reviewed with patient and spouse.Patient expressed understanding and will comply with dc instructions.follow up appointments also communicated to patient.no verbal c/o or any ssx of distress.Picc line on RUA intact and secured,dressing c/d/I.Pt discharged home with HH/infusion services per order.patient was transported home by wife in a private car.

## 2021-06-06 NOTE — TOC Progression Note (Signed)
Transition of Care [facility]) - Progression Note    Patient Details  Name: Nathan Singleton MRN: [id number] Date of Birth: 09-11-1941  Transition of Care [facility]) CM/SW June Park, RN Phone Number: 06/06/2021, 10:21 AM  Clinical Narrative:      Followed up with Pam from [place] infusion and Sarah with [facility], the patient is set up for both to go home with, He is aware that he will DC today, his girlfriend [name] is familiar with the IV ABX and will be helping him at home, he has a Rolling walker, a cane and a 3 in 1 he has transportation and can afford his medication.       Expected Discharge Plan and Services           Expected Discharge Date: 06/06/21                                     Social Determinants of Health (SDOH) Interventions    Readmission Risk Interventions Readmission Risk Prevention Plan 05/07/2021 03/23/2021 02/14/2021  Transportation Screening Complete Complete Complete  Medication Review Press photographer) Complete Complete Complete  PCP or Specialist appointment within 3-5 days of discharge Complete Complete Complete  HRI or Home Care Consult Complete Complete Complete  SW Recovery Care/Counseling Consult Complete Complete Complete  Palliative Care Screening Not Applicable Not Applicable Complete  [place] Not Applicable Not Applicable Complete

## 2021-06-06 NOTE — Discharge Summary (Signed)
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 male with medical history significant for DM, HTN, PVD, SSS s/p pacemaker, aortic stenosis s/p TAVR, CAD status post CABG, ascending aortic aneurysm 4 cm December 2021, COPD, CKD stage 3a, chronic pain, chronic anemia, bipolar and chronic thrombocytopenia who presents with worsening of chronic right second toe wound.   He has had a chronic wound to his right second toe since about May of this year and has been following with podiatry for frequent debridement. He also was recently admitted on 6/12-6/14 with syncope and LOC. Rt second toe wound at that time was evaluated by Podiatry and stable without infection. However about 2 days ago he began to note increased pain and drainage from the ulcer on the plantar side of the toe.  Yesterday he began to note worsening purulent drainage.  Today when his fiance unwrapped his wound dressing she noticed more blood, significant amount of purulent coming from the ulcer and some  new wounds to the dorsal side of the toe.  He denies any fever or chills.  No nausea, vomiting or diarrhea.  States he feels depressed about the prospects of potentially losing another toe but is willing to do what needs to be done.   He previously had his right great toe amputated on 03/20/21 by podiatry.   He also has a previously placed stent to his right popliteal artery.  Last lower extremity angiography on 03/27/2021 by Dr. Lucky Cowboy showed patent stent and normal arteries other than chronically occluded posterior tibial artery.  Hospital Course:  Right second toe osteomyelitis/staph bacteremia Hx of right great toe amputation on 03/20/21 Hx of Rt popliteal stent with Right Lower Extremity angiography on 03/27/21 with patent stent and chronically occluded posterior tibial artery. -Patient empirically on IV vancomycin and Rocephin, now blood cultures BC ID D showing MSSA, antibiotic has been narrowed to cefazolin by ID. Picc placed 6/23, will need cefazolin 2 g IV q8 ending on 7/18 with cbc and cmp faxed to ID weekly. -TEE w/o vegetations - 6/21 blood cultures ngtd - for picc placement today -Podiatry consult greatly appreciated, s/p Amputation metatarsophalangeal joint right second toe by Dr. Vickki Muff 6/21. Surg path in process - pt advising home pt, will arrange that    Procedures: Amputation of right second toe metatarsophalangeal joint on 6/21   Consultations: Podiatry, infectious disease  Discharge Exam: Vitals:   06/06/21 0504 06/06/21 0825  BP: (!) 164/75 (!) 144/63  Pulse: (!) 59 60  Resp: 17 20  Temp: 98.2 F (36.8 C) 98.4  F (36.9 C)  SpO2: 99% 99%    Awake Alert, Oriented X 3, No new F.N deficits, Normal affect Symmetrical Chest wall movement, Good air movement bilaterally, CTAB RRR,No Gallops,Rubs or new Murmurs,  +ve B.Sounds, Abd Soft, No tenderness, No rebound - guarding or rigidity. No Cyanosis, right foot bandaged  Discharge Instructions   Discharge Instructions      [facility] pharmacist to adjust dose for Vancomycin, Aminoglycosides and other anti-infective therapies as requested by physician.   Complete by: As directed    [facility] to provide Cath Flo 2mg    Complete by: As directed    Administer for PICC line occlusion and as ordered by physician for other access device issues.   Anaphylaxis Kit: Provided to treat any anaphylactic reaction to the medication being provided to the patient if First Dose or when requested by physician   Complete by: As directed    Epinephrine 1mg /ml vial / amp: Administer 0.3mg  (0.50ml) subcutaneously once for moderate to severe anaphylaxis, nurse to call physician and pharmacy when reaction occurs and call 911 if needed for immediate care   Diphenhydramine 50mg /ml IV vial: Administer 25-50mg  IV/IM PRN for first dose reaction, rash, itching, mild reaction, nurse to call physician and pharmacy when reaction occurs   Sodium Chloride 0.9% NS 577ml IV: Administer if needed for hypovolemic blood pressure drop or as ordered by physician after call to physician with anaphylactic reaction   Change dressing on IV access line weekly and PRN   Complete by: As directed    Diet - low sodium heart healthy   Complete by: As directed    Flush IV access with Sodium Chloride 0.9% and Heparin 10 units/ml or 100 units/ml   Complete by: As directed    Home infusion instructions - [facility]   Complete by: As directed    Instructions: Flush IV access with Sodium Chloride 0.9% and Heparin 10units/ml or 100units/ml   Change dressing on IV access line: Weekly and PRN   Instructions Cath Flo 2mg : Administer for PICC Line occlusion and as ordered by physician for other access device   [facility] pharmacist to adjust dose for: Vancomycin, Aminoglycosides and other anti-infective therapies as requested by physician   Increase activity slowly   Complete by: As directed    Leave dressing on - Keep it  clean, dry, and intact until clinic visit   Complete by: As directed    Method of administration may be changed at the discretion of home infusion pharmacist based upon assessment of the patient and/or caregiver's ability to self-administer the medication ordered   Complete by: As directed       Allergies as of 06/06/2021       Reactions   Atorvastatin Other (See Comments)   "bad for kidneys" Cramping   Celecoxib Other (See Comments)   Other reaction(s): Other (See Comments) Kidney Problem Kidney Problem   Dicyclomine Hcl Other (See Comments)   Stomach cramps   Doxycycline Other (See Comments)   Per wife (per RN)- pt gets severe stomach pains even when takes with food and PCP said to not take   Metformin Other (See Comments)        Medication List     TAKE these medications    acetaminophen 325 MG tablet Commonly known as: TYLENOL Take 2 tablets (650 mg total) by mouth every 6 (six) hours as needed for fever, headache or moderate pain.   albuterol 108 (90 Base) MCG/ACT inhaler  Commonly known as: VENTOLIN HFA Inhale 2 puffs into the lungs every 4 (four) hours as needed for wheezing or shortness of breath.   allopurinol 100 MG tablet Commonly known as: ZYLOPRIM Take 100 mg by mouth daily.   amLODipine 10 MG tablet Commonly known as: NORVASC Take 1 tablet (10 mg total) by mouth daily.   Bayer Aspirin EC Low Dose 81 MG EC tablet Generic drug: aspirin Take 81 mg by mouth daily.   carvedilol 3.125 MG tablet Commonly known as: COREG Take 3.125 mg by mouth 2 (two) times daily.   ceFAZolin  IVPB Commonly known as: ANCEF Inject 2 g into the vein every 8 (eight) hours for 25 days. Indication:  MSSA bacteremia with toe osteomyelitis First Dose: Yes Last Day of Therapy:  06/30/2021 Labs - Once weekly:  CBC/D and CMP, Method of administration: IV Push Please remove PICC upon completion of antibiotics Please fax lab results to Dr Delaine Lame [phone] Method of  administration may be changed at the discretion of home infusion pharmacist based upon assessment of the patient and/or caregiver's ability to self-administer the medication ordered.   clopidogrel 75 MG tablet Commonly known as: PLAVIX Take 1 tablet (75 mg total) by mouth daily.   gabapentin 100 MG capsule Commonly known as: NEURONTIN Take 100 mg by mouth daily.   Insulin Glargine-yfgn 100 UNIT/ML Sopn Inject 18 Units into the skin at bedtime.   isosorbide mononitrate 30 MG 24 hr tablet Commonly known as: IMDUR Take 30 mg by mouth daily.   loperamide 2 MG capsule Commonly known as: IMODIUM Take 1 capsule (2 mg total) by mouth as needed for diarrhea or loose stools.   multivitamin Tabs tablet Take 1 tablet by mouth daily.   pantoprazole 40 MG tablet Commonly known as: PROTONIX Take 1 tablet (40 mg total) by mouth daily.   rosuvastatin 40 MG tablet Commonly known as: CRESTOR Take 40 mg by mouth daily.   sertraline 100 MG tablet Commonly known as: ZOLOFT Take 200 mg by mouth daily.   Vitamin D3 125 MCG (5000 UT) Caps Take 5,000 Units by mouth daily.               Discharge Care Instructions  (From admission, onward)           Start     Ordered   06/06/21 0000  Change dressing on IV access line weekly and PRN  (Home infusion instructions - [facility] )        06/06/21 0952   06/06/21 0000  Leave dressing on - Keep it clean, dry, and intact until clinic visit        06/06/21 0952           Allergies  Allergen Reactions   Atorvastatin Other (See Comments)    "bad for kidneys" Cramping    Celecoxib Other (See Comments)    Other reaction(s): Other (See Comments) Kidney Problem Kidney Problem    Dicyclomine Hcl Other (See Comments)    Stomach cramps   Doxycycline Other (See Comments)    Per wife (per RN)- pt gets severe stomach pains even when takes with food and PCP said to not take     Metformin Other (See Comments)    Follow-up  Information     [name], DPM. Schedule an appointment as soon as possible for a visit in 1 week(s).   Specialty: Health visitor information: [address] [place] [address] [phone]  Lynn Ito, MD Follow up.   Specialty: Infectious Diseases Why: call to schedule an appointment on July 7th Contact information: [address] [place] [address] [phone]                  The results of significant diagnostics from this hospitalization (including imaging, microbiology, ancillary and laboratory) are listed below for reference.    Significant Diagnostic Studies: DG Chest 2 View  Result Date: 05/23/2021 CLINICAL DATA:  Palpitations and shortness of breath. EXAM: CHEST - 2 VIEW COMPARISON:  Chest x-ray dated May 06, 2021. FINDINGS: Unchanged left chest wall pacemaker. The heart size and mediastinal contours are within normal limits. Prior TAVR normal pulmonary vascularity. Minimal atelectasis at the right lung base. No focal consolidation, pleural effusion, or pneumothorax. No acute osseous abnormality. IMPRESSION: No active cardiopulmonary disease. Electronically Signed   By: Obie Dredge M.D.   On: 05/23/2021 21:53   CT Head Wo Contrast  Result Date: 05/25/2021 CLINICAL DATA:  Syncope EXAM: CT HEAD WITHOUT CONTRAST TECHNIQUE: Contiguous axial images were obtained from the base of the skull through the vertex without intravenous contrast. COMPARISON:  05/06/2021 FINDINGS: Brain: No acute intracranial abnormality. Specifically, no hemorrhage, hydrocephalus, mass lesion, acute infarction, or significant intracranial injury. Vascular: No hyperdense vessel or unexpected calcification. Skull: No acute calvarial abnormality. Postop changes in the left mastoid. 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 Sinuses/Orbits: No acute findings Other: None IMPRESSION: No acute intracranial abnormality. Electronically Signed   By: [name] M.D.    On: 05/25/2021 02:17   CT FOOT RIGHT WO CONTRAST  Result Date: 05/07/2021 CLINICAL DATA:  Foot swelling, diabetic, osteomyelitis suspected, xray done EXAM: CT OF THE RIGHT FOOT WITHOUT CONTRAST TECHNIQUE: Multidetector CT imaging of the right foot was performed according to the standard protocol. Multiplanar CT image reconstructions were also generated. COMPARISON:  Foot radiograph 05/07/2021 FINDINGS: Bones/Joint/Cartilage Prior great toe amputation. There is a cortical defect along the medial distal aspect of the residual first metatarsal (axial bone images 36 and 37 there are no other areas of cortical erosion/osseous destruction. There is no acute fracture identified. There is mild to moderate degenerative change involving the tibiotalar, middle subtalar, calcaneocuboid, talonavicular, and naviculocuneiform joints. Mild medial-sided tarsometatarsal joint and scattered interphalangeal joint degenerative change. Ligaments Suboptimally assessed by CT. Muscles and Tendons Mild muscle atrophy in the foot. Tendon other than the great toe flexor and extensors are unremarkable on noncontrast CT. Soft tissues Diffuse soft tissue swelling of the foot. IMPRESSION: Prior great toe amputation, with small cortical defect within the distal medial aspect of the residual first metatarsal which could represent small foci of osteomyelitis or postsurgical change. Additional small cortical defect at the distal tuft of the second toe which could be foci of osteomyelitis. MRI would be more sensitive. Diffuse foot soft tissue swelling. Electronically Signed   By: Caprice Renshaw   On: 05/07/2021 14:06   DG Foot 2 Views Right  Result Date: 05/07/2021 CLINICAL DATA:  Soft tissue swelling and infection, initial encounter EXAM: RIGHT FOOT - 2 VIEW COMPARISON:  03/20/2021 FINDINGS: There is been interval amputation of the first toe. Some soft tissue swelling is noted in the amputation site. Calcaneal spurring is noted. Mild tarsal  degenerative changes are seen. No discrete areas of erosive change to suggest osteomyelitis are IMPRESSION: Soft tissue swelling and interval first toe amputation. No findings to suggest osteomyelitis at this time. Electronically Signed   By: Alcide Clever M.D.   On: 05/07/2021 12:20  DG Foot Complete Right  Result Date: 06/01/2021 CLINICAL DATA:  Right foot infection EXAM: RIGHT FOOT COMPLETE - 3+ VIEW COMPARISON:  05/07/2021 FINDINGS: New erosive changes of the distal tuft of the right second toe distal phalanx compatible with acute osteomyelitis. Diffuse soft tissue swelling of the second toe including a few tiny foci of soft tissue air at the level of the distal tuft, which may reflect ulceration or wound. No soft tissue gas is seen tracking proximally within the foot. 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 No fracture or dislocation. No additional sites of bone destruction. Mild diffuse soft tissue swelling of the foot. Advanced vascular calcifications. IMPRESSION: 1. Acute osteomyelitis of the distal phalanx of the right second toe. 2. Diffuse soft tissue swelling of the second toe including a few tiny foci of soft tissue air at the level of the distal tuft, which may reflect ulceration or wound. Electronically Signed   By: Davina Poke D.O.   On: 06/01/2021 15:36   ECHO TEE  Result Date: 06/04/2021    TRANSESOPHOGEAL ECHO REPORT   Patient Name:   STAR CHEESE Date of Exam: 06/04/2021 Medical Rec #:  [id number]      Height:       68.0 in Accession #:    [id number]     Weight:       220.5 lb Date of Birth:  01-01-1941      BSA:          2.130 m Patient Age:    51 years       BP:           157/70 mmHg Patient Gender: M              HR:           60 bpm. Exam Location:  [facility] Procedure: Transesophageal Echo, Color Doppler and Cardiac Doppler Indications:     R78.81 Bacteremia  History:         Patient has prior history of Echocardiogram examinations, most                  recent 03/26/2021.  Signs/Symptoms:Bacteremia.  Sonographer:     Charmayne Sheer RDCS (AE) Referring Phys:  [id number] JUSTIN FOWLER Diagnosing Phys: Kate Sable MD PROCEDURE: The transesophogeal probe was passed without difficulty through the esophogus of the patient. Sedation performed by performing physician. The patient developed no complications during the procedure. IMPRESSIONS  1. Left ventricular ejection fraction, by estimation, is 60%. The left ventricle has normal function.  2. Right ventricular systolic function is normal. The right ventricular size is normal.  3. Left atrial size was mildly dilated. No left atrial/left atrial appendage thrombus was detected.  4. The mitral valve is normal in structure. Mild mitral valve regurgitation.  5. The aortic valve has been repaired/replaced. Aortic valve regurgitation is not visualized. Echo findings are consistent with normal structure and function of the aortic valve prosthesis. Conclusion(s)/Recommendation(s): There is a small 5 x 3 mm calcified/fibrinous structure in the right atrium (image clip 31), attached to the pacemaker lead. This was present on prior TEecho 2 months ago. likely represents prior calcified vegetation vs thrombus. no acute vegetation or endocarditis noted on this TEE.  FINDINGS  Left Ventricle: Left ventricular ejection fraction, by estimation, is 60%. The left ventricle has normal function. The left ventricular internal cavity size was normal in size. Right Ventricle: The right ventricular size is normal. No increase in right ventricular wall thickness. Right  ventricular systolic function is normal. Left Atrium: Left atrial size was mildly dilated. No left atrial/left atrial appendage thrombus was detected. Right Atrium: Right atrial size was normal in size. Pericardium: There is no evidence of pericardial effusion. Mitral Valve: The mitral valve is normal in structure. Mild mitral valve regurgitation. Tricuspid Valve: The tricuspid valve is normal in  structure. Tricuspid valve regurgitation is trivial. Aortic Valve: The aortic valve has been repaired/replaced. Aortic valve regurgitation is not visualized. There is a Sapien prosthetic, stented (TAVR) valve present in the aortic position. Echo findings are consistent with normal structure and function of  the aortic valve prosthesis. Pulmonic Valve: The pulmonic valve was grossly normal. Pulmonic valve regurgitation is not visualized. Aorta: The aortic root is normal in size and structure. IAS/Shunts: No atrial level shunt detected by color flow Doppler. Kate Sable MD Electronically signed by Kate Sable MD Signature Date/Time: 06/04/2021/2:58:51 PM    Final    [place] EKG SITE RITE  Result Date: 06/05/2021 If Site Rite image not attached, placement could not be confirmed due to current cardiac rhythm.   Microbiology: Recent Results (from the past 240 hour(s))  Resp Panel by RT-PCR (Flu A&B, Covid) Nasopharyngeal Swab     Status: None   Collection Time: 06/01/21  6:18 PM   Specimen: Nasopharyngeal Swab; Nasopharyngeal(NP) swabs in vial transport medium  Result Value Ref Range Status   SARS Coronavirus 2 by RT PCR NEGATIVE NEGATIVE Final    Comment: (NOTE) SARS-CoV-2 target nucleic acids are NOT DETECTED.  The SARS-CoV-2 RNA is generally detectable in upper respiratory specimens during the acute phase of infection. The lowest concentration of SARS-CoV-2 viral copies this assay can detect is 138 copies/mL. A negative result does not preclude SARS-Cov-2 infection and should not be used as the sole basis for treatment or other patient management decisions. A negative result may occur with  improper specimen collection/handling, submission of specimen other than nasopharyngeal swab, presence of viral mutation(s) within the areas targeted by this assay, and inadequate number of viral copies(<138 copies/mL). A negative result must be combined with clinical observations, patient history,  and epidemiological information. The expected result is Negative.  Fact Sheet for Patients:  EntrepreneurPulse.com.au  Fact Sheet for Healthcare Providers:  IncredibleEmployment.be  This test is no t yet approved or cleared by the [place] FDA and  has been authorized for detection and/or diagnosis of SARS-CoV-2 by FDA under an Emergency Use Authorization (EUA). This EUA will remain  in effect (meaning this test can be used) for the duration of the COVID-19 declaration under Section 564(b)(1) of the Act, 21 U.S.C.section 360bbb-3(b)(1), unless the authorization is terminated  or revoked sooner.       Influenza A by PCR NEGATIVE NEGATIVE Final   Influenza B by PCR NEGATIVE NEGATIVE Final    Comment: (NOTE) The Xpert Xpress SARS-CoV-2/FLU/RSV plus assay is intended as an aid in the diagnosis of influenza from Nasopharyngeal swab specimens and should not be used as a sole basis for treatment. Nasal washings and aspirates are unacceptable for Xpert Xpress SARS-CoV-2/FLU/RSV testing.  Fact Sheet for Patients: EntrepreneurPulse.com.au  Fact Sheet for Healthcare Providers: IncredibleEmployment.be  This test is not yet approved or cleared by the [place] FDA and has been authorized for detection and/or diagnosis of SARS-CoV-2 by FDA under an Emergency Use Authorization (EUA). This EUA will remain in effect (meaning this test can be used) for the duration of the COVID-19 declaration under Section 564(b)(1) of the Act, 21 U.S.C. section 360bbb-3(b)(1),  unless the authorization is terminated or revoked.  Performed at [facility], [address] [place], [place] [address]   Culture, blood (Routine X 2) w Reflex to ID Panel     Status: Abnormal   Collection Time: 06/01/21  6:38 PM   Specimen: BLOOD  Result Value Ref Range Status   Specimen Description   Final    BLOOD BLOOD LEFT  ARM Performed at [facility], [address] [place], [place] [address]    Special Requests   Final    BOTTLES DRAWN AEROBIC ONLY Blood Culture adequate volume Performed at [facility], [address], [place], [place] [address]    Culture  Setup Time   Final    GRAM POSITIVE COCCI AEROBIC BOTTLE ONLY CRITICAL VALUE NOTED.  VALUE IS CONSISTENT WITH PREVIOUSLY REPORTED AND CALLED VALUE. 06/02/21 [id number] SJL Performed at [facility], [address] [place], [place] [address]    Culture (A)  Final    STAPHYLOCOCCUS AUREUS SUSCEPTIBILITIES PERFORMED ON PREVIOUS CULTURE WITHIN THE LAST 5 DAYS. Performed at [facility], [place] [address] [place], [place] [address]    Report Status 06/04/2021 FINAL  Final  Culture, blood (Routine X 2) w Reflex to ID Panel     Status: Abnormal   Collection Time: 06/01/21  6:38 PM   Specimen: BLOOD  Result Value Ref Range Status   Specimen Description   Final    BLOOD BLOOD LEFT ARM Performed at [facility], [address], [place], [place] [address]    Special Requests   Final    BOTTLES DRAWN AEROBIC AND ANAEROBIC Blood Culture adequate volume Performed at [facility], [address] [place], [place] [address]    Culture  Setup Time   Final    Organism ID to follow GRAM POSITIVE COCCI IN BOTH AEROBIC AND ANAEROBIC BOTTLES CRITICAL RESULT CALLED TO, READ BACK BY AND VERIFIED WITH: CHRISTINE THOMPSON 06/02/21 [id number] SJL Performed at [facility], [address] [place], [place] [address]    Culture STAPHYLOCOCCUS AUREUS (A)  Final   Report Status 06/04/2021 FINAL  Final   Organism ID, Bacteria STAPHYLOCOCCUS AUREUS  Final      Susceptibility   Staphylococcus aureus - MIC*    CIPROFLOXACIN <=0.5 SENSITIVE Sensitive     ERYTHROMYCIN >=8 RESISTANT Resistant     GENTAMICIN <=0.5 SENSITIVE Sensitive     OXACILLIN 0.5 SENSITIVE Sensitive     TETRACYCLINE <=1 SENSITIVE  Sensitive     VANCOMYCIN <=0.5 SENSITIVE Sensitive     TRIMETH/SULFA <=10 SENSITIVE Sensitive     CLINDAMYCIN RESISTANT Resistant     RIFAMPIN <=0.5 SENSITIVE Sensitive     Inducible Clindamycin POSITIVE Resistant     * STAPHYLOCOCCUS AUREUS  Blood Culture ID Panel (Reflexed)     Status: Abnormal   Collection Time: 06/01/21  6:38 PM  Result Value Ref Range Status   Enterococcus faecalis NOT DETECTED NOT DETECTED Final   Enterococcus Faecium NOT DETECTED NOT DETECTED Final   Listeria monocytogenes NOT DETECTED NOT DETECTED Final   Staphylococcus species DETECTED (A) NOT DETECTED Final    Comment: CRITICAL RESULT CALLED TO, READ BACK BY AND VERIFIED WITH: CHRISTINE THOMPSON, PHARMD AT 1000 ON 06/02/21 BY SJL    Staphylococcus aureus (BCID) DETECTED (A) NOT DETECTED Final    Comment: CRITICAL RESULT CALLED TO, READ BACK BY AND VERIFIED WITH: CHRISTINE THOMPSON, PHARMD AT 1000 ON 06/02/21 BY SJL    Staphylococcus epidermidis NOT  DETECTED NOT DETECTED Final   Staphylococcus lugdunensis NOT DETECTED NOT DETECTED Final   Streptococcus species NOT DETECTED NOT DETECTED Final   Streptococcus agalactiae NOT DETECTED NOT DETECTED Final   Streptococcus pneumoniae NOT DETECTED NOT DETECTED Final   Streptococcus pyogenes NOT DETECTED NOT DETECTED Final   A.calcoaceticus-baumannii NOT DETECTED NOT DETECTED Final   Bacteroides fragilis NOT DETECTED NOT DETECTED Final   Enterobacterales NOT DETECTED NOT DETECTED Final   Enterobacter cloacae complex NOT DETECTED NOT DETECTED Final   Escherichia coli NOT DETECTED NOT DETECTED Final   Klebsiella aerogenes NOT DETECTED NOT DETECTED Final   Klebsiella oxytoca NOT DETECTED NOT DETECTED Final   Klebsiella pneumoniae NOT DETECTED NOT DETECTED Final   Proteus species NOT DETECTED NOT DETECTED Final   Salmonella species NOT DETECTED NOT DETECTED Final   Serratia marcescens NOT DETECTED NOT DETECTED Final   Haemophilus influenzae NOT DETECTED NOT DETECTED  Final   Neisseria meningitidis NOT DETECTED NOT DETECTED Final   Pseudomonas aeruginosa NOT DETECTED NOT DETECTED Final   Stenotrophomonas maltophilia NOT DETECTED NOT DETECTED Final   Candida albicans NOT DETECTED NOT DETECTED Final   Candida auris NOT DETECTED NOT DETECTED Final   Candida glabrata NOT DETECTED NOT DETECTED Final   Candida krusei NOT DETECTED NOT DETECTED Final   Candida parapsilosis NOT DETECTED NOT DETECTED Final   Candida tropicalis NOT DETECTED NOT DETECTED Final   Cryptococcus neoformans/gattii NOT DETECTED NOT DETECTED Final   Meth resistant mecA/C and MREJ NOT DETECTED NOT DETECTED Final    Comment: Performed at [facility], [address] [place], [place] [address]  Aerobic/Anaerobic Culture w Gram Stain (surgical/deep wound)     Status: None (Preliminary result)   Collection Time: 06/02/21 12:41 PM   Specimen: Wound  Result Value Ref Range Status   Specimen Description   Final    WOUND Performed at [facility], [address], [place], [place] [address]    Special Requests   Final    NONE Performed at [facility], [address] [place], [place] [address]    Gram Stain   Final    RARE WBC PRESENT,BOTH PMN AND MONONUCLEAR FEW GRAM POSITIVE COCCI IN PAIRS IN CLUSTERS Performed at [facility], [place] [address], [place], [place] [address]    Culture   Final    ABUNDANT STAPHYLOCOCCUS AUREUS ABUNDANT CORYNEBACTERIUM SPECIES Standardized susceptibility testing for this organism is not available. NO ANAEROBES ISOLATED; CULTURE IN PROGRESS FOR 5 DAYS    Report Status PENDING  Incomplete   Organism ID, Bacteria STAPHYLOCOCCUS AUREUS  Final      Susceptibility   Staphylococcus aureus - MIC*    CIPROFLOXACIN <=0.5 SENSITIVE Sensitive     ERYTHROMYCIN >=8 RESISTANT Resistant     GENTAMICIN <=0.5 SENSITIVE Sensitive     OXACILLIN 0.5 SENSITIVE Sensitive     TETRACYCLINE <=1 SENSITIVE Sensitive     VANCOMYCIN <=0.5  SENSITIVE Sensitive     TRIMETH/SULFA <=10 SENSITIVE Sensitive     CLINDAMYCIN RESISTANT Resistant     RIFAMPIN <=0.5 SENSITIVE Sensitive     Inducible Clindamycin POSITIVE Resistant     * ABUNDANT STAPHYLOCOCCUS AUREUS  Surgical pcr screen     Status: Abnormal   Collection Time: 06/02/21 11:34 PM   Specimen: Nasal Mucosa; Nasal Swab  Result Value Ref Range Status   MRSA, PCR NEGATIVE NEGATIVE Final   Staphylococcus aureus POSITIVE (A) NEGATIVE Final    Comment: (NOTE) The Xpert SA Assay (FDA approved for  NASAL specimens in patients 24 years of age and older), is one component of a comprehensive surveillance program. It is not intended to diagnose infection nor to guide or monitor treatment. Performed at [facility], [address] [place], [place] [address]   CULTURE, BLOOD (ROUTINE X 2) w Reflex to ID Panel     Status: None (Preliminary result)   Collection Time: 06/03/21  7:01 AM   Specimen: BLOOD  Result Value Ref Range Status   Specimen Description BLOOD RIGHT HAND  Final   Special Requests   Final    BOTTLES DRAWN AEROBIC AND ANAEROBIC Blood Culture adequate volume   Culture   Final    NO GROWTH 2 DAYS Performed at [facility], [address], [place], [place] [address]    Report Status PENDING  Incomplete  CULTURE, BLOOD (ROUTINE X 2) w Reflex to ID Panel     Status: None (Preliminary result)   Collection Time: 06/03/21  7:08 AM   Specimen: BLOOD  Result Value Ref Range Status   Specimen Description BLOOD LEFT HAND  Final   Special Requests   Final    BOTTLES DRAWN AEROBIC AND ANAEROBIC Blood Culture results may not be optimal due to an excessive volume of blood received in culture bottles   Culture   Final    NO GROWTH 2 DAYS Performed at [facility], [address] [place], [place] [address]    Report Status PENDING  Incomplete  Aerobic/Anaerobic Culture w Gram Stain (surgical/deep wound)     Status: None (Preliminary result)    Collection Time: 06/03/21  1:35 PM   Specimen: PATH Digit amputation; Tissue  Result Value Ref Range Status   Specimen Description   Final    TISSUE Performed at [facility], [address] [place], [place] [address]    Special Requests   Final    RIGHT SECOND TOE CULTURE Performed at [facility], [address] [place], [place] [address]    Gram Stain   Final    FEW WBC PRESENT, PREDOMINANTLY PMN FEW GRAM POSITIVE COCCI IN PAIRS IN CLUSTERS Performed at [facility], [place] [address] [place], [place] [address]    Culture   Final    RARE STAPHYLOCOCCUS AUREUS SUSCEPTIBILITIES TO FOLLOW NO ANAEROBES ISOLATED; CULTURE IN PROGRESS FOR 5 DAYS    Report Status PENDING  Incomplete     Labs: Basic Metabolic Panel: Recent Labs  Lab 06/01/21 1452 06/02/21 0524 06/03/21 0552 06/05/21 1309 06/06/21 0448  NA 134* 137 137 133* 138  K 4.1 4.9 4.1 4.9 4.4  CL 107 106 107 104 109  CO2 $Re'22 23 23 22 26  'tVG$ GLUCOSE 171* 219* 87 343* 60*  BUN 35* 37* 34* 37* 36*  CREATININE 1.17 1.21 1.25* 1.28* 1.17  CALCIUM 8.5* 8.6* 8.4* 8.3* 8.7*   Liver Function Tests: Recent Labs  Lab 06/01/21 1452  AST 36  ALT 28  ALKPHOS 83  BILITOT 0.6  PROT 7.8  ALBUMIN 3.7   No results for input(s): LIPASE, AMYLASE in the last 168 hours. No results for input(s): AMMONIA in the last 168 hours. CBC: Recent Labs  Lab 06/01/21 1452 06/03/21 0552 06/05/21 1309 06/06/21 0448  WBC 5.6 4.9 3.2* 3.5*  NEUTROABS 4.4  --   --   --   HGB 9.8* 8.8* 8.9* 8.9*  HCT 29.8* 26.4* 26.6* 27.4*  MCV 91.4 88.9 89.3 89.8  PLT 119* 115* 129* 132*   Cardiac  Enzymes: No results for input(s): CKTOTAL, CKMB, CKMBINDEX, TROPONINI in the last 168 hours. BNP: BNP (last 3 results) Recent Labs    03/20/21 0346 05/06/21 0150  BNP 155.6* 118.7*    ProBNP (last 3 results) No results for input(s): PROBNP in the last 8760 hours.  CBG: Recent Labs  Lab 06/05/21 1540 06/05/21 1715  06/05/21 2139 06/06/21 0222 06/06/21 0947  GLUCAP 171* 186* 142* 131* 210*       Signed:  Desma Maxim MD.  [facility] 06/06/2021, 9:53 AM

## 2021-06-07 LAB — AEROBIC/ANAEROBIC CULTURE W GRAM STAIN (SURGICAL/DEEP WOUND)

## 2021-06-08 LAB — AEROBIC/ANAEROBIC CULTURE W GRAM STAIN (SURGICAL/DEEP WOUND)

## 2021-06-08 LAB — CULTURE, BLOOD (ROUTINE X 2)
Culture: NO GROWTH
Culture: NO GROWTH
Special Requests: ADEQUATE

## 2021-06-17 ENCOUNTER — Telehealth: Payer: Self-pay | Admitting: Gastroenterology

## 2021-06-17 ENCOUNTER — Encounter (INDEPENDENT_AMBULATORY_CARE_PROVIDER_SITE_OTHER): Payer: 59

## 2021-06-17 ENCOUNTER — Ambulatory Visit (INDEPENDENT_AMBULATORY_CARE_PROVIDER_SITE_OTHER): Payer: 59 | Admitting: Vascular Surgery

## 2021-06-17 NOTE — Telephone Encounter (Signed)
Patients wife calling and wants to know if they still need the 3 mth f/u even though they didn't have the procedure?

## 2021-06-18 NOTE — Telephone Encounter (Signed)
Reschedule appointment after procedure has been completed

## 2021-06-18 NOTE — Telephone Encounter (Signed)
Patient verbalized understanding and cancel the procedure

## 2021-06-19 ENCOUNTER — Other Ambulatory Visit: Payer: Self-pay

## 2021-06-19 ENCOUNTER — Ambulatory Visit: Payer: Medicaid Other | Admitting: Gastroenterology

## 2021-06-19 ENCOUNTER — Ambulatory Visit: Payer: 59 | Attending: Infectious Diseases | Admitting: Infectious Diseases

## 2021-06-19 VITALS — BP 157/79 | HR 63 | Temp 98.4°F | Resp 16 | Ht 68.0 in | Wt 220.5 lb

## 2021-06-19 DIAGNOSIS — B9561 Methicillin susceptible Staphylococcus aureus infection as the cause of diseases classified elsewhere: Secondary | ICD-10-CM | POA: Insufficient documentation

## 2021-06-19 DIAGNOSIS — E1151 Type 2 diabetes mellitus with diabetic peripheral angiopathy without gangrene: Secondary | ICD-10-CM | POA: Insufficient documentation

## 2021-06-19 DIAGNOSIS — Z79899 Other long term (current) drug therapy: Secondary | ICD-10-CM | POA: Insufficient documentation

## 2021-06-19 DIAGNOSIS — Z96653 Presence of artificial knee joint, bilateral: Secondary | ICD-10-CM | POA: Insufficient documentation

## 2021-06-19 DIAGNOSIS — Z794 Long term (current) use of insulin: Secondary | ICD-10-CM | POA: Diagnosis not present

## 2021-06-19 DIAGNOSIS — Z89421 Acquired absence of other right toe(s): Secondary | ICD-10-CM | POA: Insufficient documentation

## 2021-06-19 DIAGNOSIS — E785 Hyperlipidemia, unspecified: Secondary | ICD-10-CM | POA: Diagnosis not present

## 2021-06-19 DIAGNOSIS — E1169 Type 2 diabetes mellitus with other specified complication: Secondary | ICD-10-CM | POA: Diagnosis not present

## 2021-06-19 DIAGNOSIS — R7881 Bacteremia: Secondary | ICD-10-CM | POA: Insufficient documentation

## 2021-06-19 DIAGNOSIS — Z7982 Long term (current) use of aspirin: Secondary | ICD-10-CM | POA: Insufficient documentation

## 2021-06-19 DIAGNOSIS — I2510 Atherosclerotic heart disease of native coronary artery without angina pectoris: Secondary | ICD-10-CM | POA: Diagnosis not present

## 2021-06-19 DIAGNOSIS — L089 Local infection of the skin and subcutaneous tissue, unspecified: Secondary | ICD-10-CM | POA: Diagnosis not present

## 2021-06-19 DIAGNOSIS — Z7901 Long term (current) use of anticoagulants: Secondary | ICD-10-CM | POA: Diagnosis not present

## 2021-06-19 DIAGNOSIS — Z87891 Personal history of nicotine dependence: Secondary | ICD-10-CM | POA: Insufficient documentation

## 2021-06-19 DIAGNOSIS — Z881 Allergy status to other antibiotic agents status: Secondary | ICD-10-CM | POA: Insufficient documentation

## 2021-06-19 DIAGNOSIS — M869 Osteomyelitis, unspecified: Secondary | ICD-10-CM | POA: Diagnosis not present

## 2021-06-19 DIAGNOSIS — D649 Anemia, unspecified: Secondary | ICD-10-CM | POA: Insufficient documentation

## 2021-06-19 DIAGNOSIS — Z7902 Long term (current) use of antithrombotics/antiplatelets: Secondary | ICD-10-CM | POA: Diagnosis not present

## 2021-06-19 NOTE — Patient Instructions (Signed)
You are here for follow up of the rt foot infection- you are on cefazolin Iv and the foot is looking good- please follow up with Dr.Fowler for suture removal. Will follow 4 weeks.

## 2021-06-19 NOTE — Progress Notes (Signed)
NAME: Nathan Singleton  DOB: 18-Feb-1941  MRN: [id number]  Date/Time: 06/19/2021 10:18 AM  Subjective:   Follow-up visit after recent hospitalization.  Patient is here with his wife.  Patient was in the hospital between 06/01/2021 until 06/06/2021 for right foot infection and MSSA bacteremia. Nathan Singleton is a 80 y.o. with a history of diabetes mellitus, peripheral arterial disease, neuropathy, CAD, hyperlipidemia, TAVR, pacemaker, bilateral knee replacement, excision of head of proximal phalanx right great toe, excision lateral condyle distal phalanx right great toe recent MSSA bacteremia and osteomyelitis in April 2022 and treated with 6 weeks of IV antibiotics followed by 2 weeks of p.o. Keflex.  He presented to the hospital with right foot pain and bleeding on 06/01/2021.  He had Staphylococcus bacteremia.  He underwent TEE on 06/04/2021 which was negative for endocarditis but showed a calcified lesion on the pacemaker wire which was seen in the prior TEE in April 2022.Marland Kitchen  Repeat blood culture from 06/03/2021 was negative.  He underwent amputation for the second toe osteomyelitis and tissue culture was staph aureus.  He was discharged home on IV cefazolin to complete 4 weeks on 07/01/2019.. .  Patient is here for follow-up and he is doing well. His wife is concerned the right foot may be swollen and erythematous. Patient has no fever or chills No pain in the right foot Has seen Dr. Vickki Muff after hospital discharge.  Has an appointment next week for suture removal   Past Medical History:  Diagnosis Date   Aneurysm (arteriovenous) of coronary vessels    Arthritis    Ascending aortic aneurysm (HCC)    CAD (coronary artery disease)    Diabetes mellitus without complication ([place])    Hard of hearing    History of GI bleed    Hyperlipidemia associated with type 2 diabetes mellitus ([place])    Hypertension associated with diabetes ([place])    Presence of permanent cardiac pacemaker    PVD (peripheral vascular  disease) ([place])    S/P TAVR (transcatheter aortic valve replacement)     Past Surgical History:  Procedure Laterality Date   AMPUTATION TOE Right 03/20/2021   Procedure: AMPUTATION TOE;  Surgeon: Sharlotte Alamo, DPM;  Location: [facility] ORS;  Service: Podiatry;  Laterality: Right;   AMPUTATION TOE Right 06/03/2021   Procedure: AMPUTATION TOE-SECOND TOE;  Surgeon: [name], DPM;  Location: [facility] ORS;  Service: Podiatry;  Laterality: Right;   Ankle repaired Right    Carpal Tunnel repaired Left    CATARACT EXTRACTION Bilateral    COLONOSCOPY     Copillar implant      DG CHOLECYSTOGRAPHY GALL BLADDER ([facility] HX)     GASTRIC BYPASS     heart valve replaced     IRRIGATION AND DEBRIDEMENT FOOT Right 02/18/2021   Procedure: IRRIGATION AND DEBRIDEMENT FOOT-Right Great Toe;  Surgeon: [name], DPM;  Location: [facility] OR[facility];  Service: Podiatry;  Laterality: Right;   LOWER EXTREMITY ANGIOGRAPHY Right 02/17/2021   Procedure: Lower Extremity Angiography;  Surgeon: Algernon Huxley, MD;  Location: [facility];  Service: Cardiovascular;  Laterality: Right;   LOWER EXTREMITY ANGIOGRAPHY Right 03/27/2021   Procedure: Lower Extremity Angiography;  Surgeon: Algernon Huxley, MD;  Location: [facility];  Service: Cardiovascular;  Laterality: Right;   PACEMAKER GENERATOR CHANGE     REPLACEMENT TOTAL KNEE BILATERAL     RTC     STENT PLACE LEFT URETER ([place] [facility]) Right    Right Leg   TEE WITHOUT CARDIOVERSION N/A 03/26/2021  Procedure: TRANSESOPHAGEAL ECHOCARDIOGRAM (TEE);  Surgeon: Kate Sable, MD;  Location: [facility];  Service: Cardiovascular;  Laterality: N/A;   TEE WITHOUT CARDIOVERSION N/A 06/04/2021   Procedure: TRANSESOPHAGEAL ECHOCARDIOGRAM (TEE);  Surgeon: Kate Sable, MD;  Location: [facility] OR[facility];  Service: Cardiovascular;  Laterality: N/A;   Toe nail removed Bilateral     Social History   Socioeconomic History   Marital status: Single    Spouse name: Not on file   Number of children:  Not on file   Years of education: Not on file   Highest education level: Not on file  Occupational History   Not on file  Tobacco Use   Smoking status: Former    Pack years: 0.00   Smokeless tobacco: Never  Substance and Sexual Activity   Alcohol use: Not Currently   Drug use: Not Currently   Sexual activity: Not on file  Other Topics Concern   Not on file  Social History Narrative   Not on file   Social Determinants of Health   Financial Resource Strain: Not on file  Food Insecurity: Not on file  Transportation Needs: Not on file  Physical Activity: Not on file  Stress: Not on file  Social Connections: Not on file  Intimate Partner Violence: Not on file    Family History  Problem Relation Age of Onset   Heart disease Mother    Alzheimer's disease Mother    Heart disease Father    Diabetes Father    Allergies  Allergen Reactions   Atorvastatin Other (See Comments)    "bad for kidneys" Cramping    Celecoxib Other (See Comments)    Other reaction(s): Other (See Comments) Kidney Problem Kidney Problem    Dicyclomine Hcl Other (See Comments)    Stomach cramps   Doxycycline Other (See Comments)    Per wife (per RN)- pt gets severe stomach pains even when takes with food and PCP said to not take     Metformin Other (See Comments)   I? Current Outpatient Medications  Medication Sig Dispense Refill   acetaminophen (TYLENOL) 325 MG tablet Take 2 tablets (650 mg total) by mouth every 6 (six) hours as needed for fever, headache or moderate pain.     albuterol (VENTOLIN HFA) 108 (90 Base) MCG/ACT inhaler Inhale 2 puffs into the lungs every 4 (four) hours as needed for wheezing or shortness of breath. 18 g 0   allopurinol (ZYLOPRIM) 100 MG tablet Take 100 mg by mouth daily.     amLODipine (NORVASC) 10 MG tablet Take 1 tablet (10 mg total) by mouth daily. 30 tablet 0   BAYER ASPIRIN EC LOW DOSE 81 MG EC tablet Take 81 mg by mouth daily.     carvedilol (COREG) 3.125 MG  tablet Take 3.125 mg by mouth 2 (two) times daily.     ceFAZolin (ANCEF) IVPB Inject 2 g into the vein every 8 (eight) hours for 25 days. Indication:  MSSA bacteremia with toe osteomyelitis First Dose: Yes Last Day of Therapy:  06/30/2021 Labs - Once weekly:  CBC/D and CMP, Method of administration: IV Push Please remove PICC upon completion of antibiotics Please fax lab results to Dr Delaine Lame [phone] Method of administration may be changed at the discretion of home infusion pharmacist based upon assessment of the patient and/or caregiver's ability to self-administer the medication ordered. 75 Units 0   Cholecalciferol (VITAMIN D3) 125 MCG (5000 UT) CAPS Take 5,000 Units by mouth daily.     clopidogrel (PLAVIX)  75 MG tablet Take 1 tablet (75 mg total) by mouth daily. 60 tablet 3   gabapentin (NEURONTIN) 100 MG capsule Take 100 mg by mouth daily.     Insulin Glargine-yfgn 100 UNIT/ML SOPN Inject 18 Units into the skin at bedtime.     isosorbide mononitrate (IMDUR) 30 MG 24 hr tablet Take 30 mg by mouth daily.     loperamide (IMODIUM) 2 MG capsule Take 1 capsule (2 mg total) by mouth as needed for diarrhea or loose stools. 30 capsule 0   multivitamin (ONE-A-DAY MEN'S) TABS tablet Take 1 tablet by mouth daily.     pantoprazole (PROTONIX) 40 MG tablet Take 1 tablet (40 mg total) by mouth daily. 90 tablet 1   rosuvastatin (CRESTOR) 40 MG tablet Take 40 mg by mouth daily.     sertraline (ZOLOFT) 100 MG tablet Take 200 mg by mouth daily.     No current facility-administered medications for this visit.     Abtx:  Anti-infectives (From admission, onward)    None       REVIEW OF SYSTEMS:  Const: negative fever, negative chills, negative weight loss Eyes: negative diplopia or visual changes, negative eye pain ENT: negative coryza, negative sore throat, hard of hearing Resp: negative cough, hemoptysis, dyspnea Cards: negative for chest pain, palpitations, lower extremity edema GU:  negative for frequency, dysuria and hematuria GI: Negative for abdominal pain, diarrhea, bleeding, constipation Skin: negative for rash and pruritus Heme: negative for easy bruising and gum/nose bleeding MS: negative for myalgias, arthralgias, back pain and muscle weakness Neurolo:negative for headaches, dizziness, vertigo, memory problems  Psych: negative for feelings of anxiety, depression  Endocrine: negative for thyroid, diabetes Allergy/Immunology-allergies as above Objective:  VITALS:  BP (!) 157/79   Pulse 63   Temp 98.4 F (36.9 C) (Oral)   Resp 16   Ht '5\' 8"'$  (1.727 m)   Wt 220 lb 7.4 oz (100 kg)   SpO2 99%   BMI 33.52 kg/m  PHYSICAL EXAM:  General: Alert, cooperative, no distress, appears stated age.  Hard of hearing.  In wheelchair. Head: Normocephalic, without obvious abnormality, atraumatic. Eyes: Conjunctivae clear, anicteric sclerae. Pupils are equal ENT Nares normal. No drainage or sinus tenderness. Lips, mucosa, and tongue normal. No Thrush Neck: Supple, symmetrical, no adenopathy, thyroid: non tender no carotid bruit and no JVD. Back: Did not examine lungs: Clear to auscultation bilaterally. No Wheezing or Rhonchi. No rales. Heart: Regular rate and rhythm, no murmur, rub or gallop. Pacemaker site clean Abdomen: Did not examine Extremities: Right foot.  Second toe amputation site has scab.  No erythema.  Just venous staining of the foot and the leg.. Right PICC site clean. Skin: No rashes or lesions. Or bruising Lymph: Cervical, supraclavicular normal. Neurologic: Grossly non-focal   ? Impression/Recommendation ?MSSA bacteremia secondary to rt foot infection. Pt is on cefazolin- end date initally planned was 7/18 - 4 weeks of RX- but because of TAVR, previous MSSA bacteremia in April 2022 will give him for 6 weeks Has a TAVR and pacemaker hence TEE was also done and did not show any valvular vegetation.  There was a calcified lesion on the pacemaker wire was  seen on the prior TEE in April 2022.   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  Anemia Also had mild leucopenia and thrombocytopenia- labs drawn on 06/18/21 pending  History of b/l knee replacements  PAD  Will let the home infusion company and nursing service know about  extending antibiotics  Follow up 1 month   ? ? ___________________________________________________ Discussed with patient,and his wife in detail Note:  This document was prepared using Dragon voice recognition software and may include unintentional dictation errors.

## 2021-06-24 ENCOUNTER — Telehealth: Payer: Self-pay

## 2021-06-24 NOTE — Telephone Encounter (Signed)
Advised patient we will do 2 more weeks of IV abx with expected end date of 07/14/2021. Also advised AHI Ladona Ridgel,

## 2021-06-25 DIAGNOSIS — R809 Proteinuria, unspecified: Secondary | ICD-10-CM | POA: Insufficient documentation

## 2021-06-25 DIAGNOSIS — R609 Edema, unspecified: Secondary | ICD-10-CM | POA: Insufficient documentation

## 2021-06-25 DIAGNOSIS — I739 Peripheral vascular disease, unspecified: Secondary | ICD-10-CM | POA: Insufficient documentation

## 2021-06-25 DIAGNOSIS — D631 Anemia in chronic kidney disease: Secondary | ICD-10-CM | POA: Insufficient documentation

## 2021-06-25 DIAGNOSIS — N189 Chronic kidney disease, unspecified: Secondary | ICD-10-CM | POA: Insufficient documentation

## 2021-06-25 DIAGNOSIS — E1122 Type 2 diabetes mellitus with diabetic chronic kidney disease: Secondary | ICD-10-CM | POA: Insufficient documentation

## 2021-06-27 DIAGNOSIS — R4182 Altered mental status, unspecified: Secondary | ICD-10-CM | POA: Insufficient documentation

## 2021-06-27 DIAGNOSIS — I10 Essential (primary) hypertension: Secondary | ICD-10-CM | POA: Insufficient documentation

## 2021-06-27 DIAGNOSIS — N1831 Chronic kidney disease, stage 3a: Secondary | ICD-10-CM | POA: Insufficient documentation

## 2021-06-27 DIAGNOSIS — N179 Acute kidney failure, unspecified: Secondary | ICD-10-CM | POA: Insufficient documentation

## 2021-06-30 ENCOUNTER — Other Ambulatory Visit (INDEPENDENT_AMBULATORY_CARE_PROVIDER_SITE_OTHER): Payer: Self-pay | Admitting: Vascular Surgery

## 2021-06-30 DIAGNOSIS — Z992 Dependence on renal dialysis: Secondary | ICD-10-CM

## 2021-06-30 DIAGNOSIS — Z95828 Presence of other vascular implants and grafts: Secondary | ICD-10-CM

## 2021-07-02 ENCOUNTER — Other Ambulatory Visit: Payer: Self-pay

## 2021-07-02 ENCOUNTER — Encounter (INDEPENDENT_AMBULATORY_CARE_PROVIDER_SITE_OTHER): Payer: Self-pay | Admitting: Nurse Practitioner

## 2021-07-02 ENCOUNTER — Ambulatory Visit (INDEPENDENT_AMBULATORY_CARE_PROVIDER_SITE_OTHER): Payer: 59

## 2021-07-02 ENCOUNTER — Ambulatory Visit (INDEPENDENT_AMBULATORY_CARE_PROVIDER_SITE_OTHER): Payer: 59 | Admitting: Nurse Practitioner

## 2021-07-02 VITALS — BP 169/68 | HR 66 | Resp 16 | Wt 229.2 lb

## 2021-07-02 DIAGNOSIS — Z992 Dependence on renal dialysis: Secondary | ICD-10-CM

## 2021-07-02 DIAGNOSIS — Z95828 Presence of other vascular implants and grafts: Secondary | ICD-10-CM

## 2021-07-02 DIAGNOSIS — E1169 Type 2 diabetes mellitus with other specified complication: Secondary | ICD-10-CM

## 2021-07-02 DIAGNOSIS — I70239 Atherosclerosis of native arteries of right leg with ulceration of unspecified site: Secondary | ICD-10-CM

## 2021-07-02 DIAGNOSIS — E119 Type 2 diabetes mellitus without complications: Secondary | ICD-10-CM | POA: Diagnosis not present

## 2021-07-02 DIAGNOSIS — E785 Hyperlipidemia, unspecified: Secondary | ICD-10-CM

## 2021-07-02 DIAGNOSIS — I10 Essential (primary) hypertension: Secondary | ICD-10-CM

## 2021-07-12 ENCOUNTER — Encounter (INDEPENDENT_AMBULATORY_CARE_PROVIDER_SITE_OTHER): Payer: Self-pay | Admitting: Nurse Practitioner

## 2021-07-12 NOTE — Progress Notes (Signed)
Subjective:    Patient ID: Nathan Singleton, male    DOB: [birth year], 80 y.o.   MRN: [id number] Chief Complaint  Patient presents with   Follow-up    3 month ABI    The patient returns to the office for followup and review of the noninvasive studies. There have been no interval changes in lower extremity symptoms. No interval shortening of the patient's claudication distance or development of rest pain symptoms.  The patient has a wound on the right toe that is being treated by the wound center.  He also had an amputation done to the second toe due to infection.  The patient denies amaurosis fugax or recent TIA symptoms. There are no recent neurological changes noted. The patient denies history of DVT, PE or superficial thrombophlebitis. The patient denies recent episodes of angina or shortness of breath.   ABI Rt= 1.05 and Lt= 1.12 (previous ABI's Rt= 1.18 and Lt= 1.26) Duplex ultrasound of the bilateral tibial arteries reveals biphasic waveforms.  The left great toe has good toe waveforms   Review of Systems  Musculoskeletal:  Positive for gait problem.  Skin:  Positive for wound.  All other systems reviewed and are negative.     Objective:   Physical Exam Vitals reviewed.  HENT:     Head: Normocephalic.  Cardiovascular:     Rate and Rhythm: Normal rate.     Pulses: Decreased pulses.  Pulmonary:     Effort: Pulmonary effort is normal.  Neurological:     Mental Status: He is alert and oriented to person, place, and time.  Psychiatric:        Mood and Affect: Mood normal.        Behavior: Behavior normal.        Thought Content: Thought content normal.        Judgment: Judgment normal.    BP (!) 169/68 (BP Location: Left Arm)   Pulse 66   Resp 16   Wt 229 lb 3.2 oz (104 kg)   BMI 34.85 kg/m   Past Medical History:  Diagnosis Date   Aneurysm (arteriovenous) of coronary vessels    Arthritis    Ascending aortic aneurysm (HCC)    CAD (coronary artery disease)     Diabetes mellitus without complication (HCC)    Hard of hearing    History of GI bleed    Hyperlipidemia associated with type 2 diabetes mellitus (HCC)    Hypertension associated with diabetes ([place])    Presence of permanent cardiac pacemaker    PVD (peripheral vascular disease) (HCC)    S/P TAVR (transcatheter aortic valve replacement)     Social History   Socioeconomic History   Marital status: Single    Spouse name: Not on file   Number of children: Not on file   Years of education: Not on file   Highest education level: Not on file  Occupational History   Not on file  Tobacco Use   Smoking status: Former   Smokeless tobacco: Never  Substance and Sexual Activity   Alcohol use: Not Currently   Drug use: Not Currently   Sexual activity: Not on file  Other Topics Concern   Not on file  Social History Narrative   Not on file   Social Determinants of Health   Financial Resource Strain: Not on file  Food Insecurity: Not on file  Transportation Needs: Not on file  Physical Activity: Not on file  Stress: Not on file  Social Connections:  Not on file  Intimate Partner Violence: Not on file    Past Surgical History:  Procedure Laterality Date   AMPUTATION TOE Right 03/20/2021   Procedure: AMPUTATION TOE;  Surgeon: Sharlotte Alamo, DPM;  Location: [facility];  Service: Podiatry;  Laterality: Right;   AMPUTATION TOE Right 06/03/2021   Procedure: AMPUTATION TOE-SECOND TOE;  Surgeon: [name], DPM;  Location: [facility];  Service: Podiatry;  Laterality: Right;   Ankle repaired Right    Carpal Tunnel repaired Left    CATARACT EXTRACTION Bilateral    COLONOSCOPY     Copillar implant      DG CHOLECYSTOGRAPHY GALL BLADDER ([facility] HX)     GASTRIC BYPASS     heart valve replaced     IRRIGATION AND DEBRIDEMENT FOOT Right 02/18/2021   Procedure: IRRIGATION AND DEBRIDEMENT FOOT-Right Great Toe;  Surgeon: [name], DPM;  Location: [facility];  Service: Podiatry;  Laterality: Right;    LOWER EXTREMITY ANGIOGRAPHY Right 02/17/2021   Procedure: Lower Extremity Angiography;  Surgeon: Algernon Huxley, MD;  Location: [place] [facility];  Service: Cardiovascular;  Laterality: Right;   LOWER EXTREMITY ANGIOGRAPHY Right 03/27/2021   Procedure: Lower Extremity Angiography;  Surgeon: Algernon Huxley, MD;  Location: [place] [facility];  Service: Cardiovascular;  Laterality: Right;   PACEMAKER GENERATOR CHANGE     REPLACEMENT TOTAL KNEE BILATERAL     RTC     STENT PLACE LEFT URETER ([place] HX) Right    Right Leg   TEE WITHOUT CARDIOVERSION N/A 03/26/2021   Procedure: TRANSESOPHAGEAL ECHOCARDIOGRAM (TEE);  Surgeon: Kate Sable, MD;  Location: [facility];  Service: Cardiovascular;  Laterality: N/A;   TEE WITHOUT CARDIOVERSION N/A 06/04/2021   Procedure: TRANSESOPHAGEAL ECHOCARDIOGRAM (TEE);  Surgeon: Kate Sable, MD;  Location: [facility];  Service: Cardiovascular;  Laterality: N/A;   Toe nail removed Bilateral     Family History  Problem Relation Age of Onset   Heart disease Mother    Alzheimer's disease Mother    Heart disease Father    Diabetes Father     Allergies  Allergen Reactions   Atorvastatin Other (See Comments)    "bad for kidneys" Cramping    Celecoxib Other (See Comments)    Other reaction(s): Other (See Comments) Kidney Problem Kidney Problem    Dicyclomine Hcl Other (See Comments)    Stomach cramps   Doxycycline Other (See Comments)    Per wife (per RN)- pt gets severe stomach pains even when takes with food and PCP said to not take     Metformin Other (See Comments)    CBC Latest Ref Rng & Units 06/06/2021 06/05/2021 06/03/2021  WBC 4.0 - 10.5 K/uL 3.5(L) 3.2(L) 4.9  Hemoglobin 13.0 - 17.0 g/dL 8.9(L) 8.9(L) 8.8(L)  Hematocrit 39.0 - 52.0 % 27.4(L) 26.6(L) 26.4(L)  Platelets 150 - 400 K/uL 132(L) 129(L) 115(L)      CMP     Component Value Date/Time   NA 138 06/06/2021 0448   NA 135 03/03/2021 1628   K 4.4 06/06/2021 0448   CL 109  06/06/2021 0448   CO2 26 06/06/2021 0448   GLUCOSE 60 (L) 06/06/2021 0448   BUN 36 (H) 06/06/2021 0448   BUN 50 (H) 03/03/2021 1628   CREATININE 1.17 06/06/2021 0448   CALCIUM 8.7 (L) 06/06/2021 0448   PROT 7.8 06/01/2021 1452   PROT 7.0 03/03/2021 1628   ALBUMIN 3.7 06/01/2021 1452   ALBUMIN 4.3 03/03/2021 1628   AST 36 06/01/2021 1452  ALT 28 06/01/2021 1452   ALKPHOS 83 06/01/2021 1452   BILITOT 0.6 06/01/2021 1452   BILITOT 0.3 03/03/2021 1628   GFRNONAA >60 06/06/2021 0448   GFRAA 76 11/19/2020 1439     ABI WITH/WO TBI  Result Date: 07/08/2021  LOWER EXTREMITY DOPPLER STUDY Patient Name:  EASTAN KALUZA  Date of Exam:   07/02/2021 Medical Rec #: [id number]       Accession #:    [id number] Date of Birth: 09/02/1941       Patient Gender: M Patient Age:   66Y Exam Location:  [facility] Procedure:      VAS [place] ABI WITH/WO TBI Referring Phys: [id number] [place] --------------------------------------------------------------------------------  Indications: Peripheral artery disease.  Vascular Interventions: 02/17/21 Right PTA of SFA, DP, and ATA. Comparison Study: 03/19/2021 Performing Technologist: Concha Norway RVT  Examination Guidelines: A complete evaluation includes at minimum, Doppler waveform signals and systolic blood pressure reading at the level of bilateral brachial, anterior tibial, and posterior tibial arteries, when vessel segments are accessible. Bilateral testing is considered an integral part of a complete examination. Photoelectric Plethysmograph (PPG) waveforms and toe systolic pressure readings are included as required and additional duplex testing as needed. Limited examinations for reoccurring indications may be performed as noted.  ABI Findings: +---------+------------------+-----+--------+---------+ Right    Rt Pressure (mmHg)IndexWaveformComment   +---------+------------------+-----+--------+---------+ Brachial 189                                       +---------+------------------+-----+--------+---------+ ATA      195               1.03 biphasic          +---------+------------------+-----+--------+---------+ PTA      198               1.05 biphasic          +---------+------------------+-----+--------+---------+ Great Toe                               Amputated +---------+------------------+-----+--------+---------+ +---------+------------------+-----+--------+-------+ Left     Lt Pressure (mmHg)IndexWaveformComment +---------+------------------+-----+--------+-------+ Brachial 189                                    +---------+------------------+-----+--------+-------+ ATA      212               1.12 biphasic        +---------+------------------+-----+--------+-------+ PTA      208               1.10 biphasic        +---------+------------------+-----+--------+-------+ Great Toe166               0.88 Normal          +---------+------------------+-----+--------+-------+ +-------+-----------+-----------+------------+------------+ ABI/TBIToday's ABIToday's TBIPrevious ABIPrevious TBI +-------+-----------+-----------+------------+------------+ Right  1.05       AMP        1.18        .92          +-------+-----------+-----------+------------+------------+ Left   1.12       .88        1.26        .90          +-------+-----------+-----------+------------+------------+  Summary: Right: Resting right ankle-brachial index is  within normal range. No evidence of significant right lower extremity arterial disease. Left: Resting left ankle-brachial index is within normal range. No evidence of significant left lower extremity arterial disease. The left toe-brachial index is normal.  *See table(s) above for measurements and observations.  Electronically signed by Leotis Pain MD on 07/08/2021 at 5:03:01 PM.    Final        Assessment & Plan:   1. 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  The patient does not voice lifestyle limiting changes at this point in time.  Noninvasive studies do not suggest clinically significant change.  No invasive studies, angiography or surgery at this time The patient should continue walking and begin a more formal exercise program.  The patient should continue antiplatelet therapy and aggressive treatment of the lipid abnormalities  No changes in the patient's medications at this time  The patient should continue wearing graduated compression socks 10-15 mmHg strength to control the mild edema.    On the noninvasive studies the patient should have adequate perfusion for wound healing.  2. Primary hypertension Continue antihypertensive medications as already ordered, these medications have been reviewed and there are no changes at this time.   3. Diabetes mellitus without complication ([place]) Continue hypoglycemic medications as already ordered, these medications have been reviewed and there are no changes at this time.  Hgb A1C to be monitored as already arranged by primary service   4. Hyperlipidemia associated with type 2 diabetes mellitus ([place]) Continue statin as ordered and reviewed, no changes at this time    Current Outpatient Medications on File Prior to Visit  Medication Sig Dispense Refill   acetaminophen (TYLENOL) 325 MG tablet Take 2 tablets (650 mg total) by mouth every 6 (six) hours as needed for fever, headache or moderate pain.     albuterol (VENTOLIN HFA) 108 (90 Base) MCG/ACT inhaler Inhale 2 puffs into the lungs every 4 (four) hours as needed for wheezing or shortness of breath. 18 g 0   allopurinol (ZYLOPRIM) 100 MG tablet Take 100 mg by mouth daily.     BAYER ASPIRIN EC LOW DOSE 81 MG EC tablet Take 81 mg by mouth daily.     carvedilol (COREG) 3.125 MG tablet Take 3.125  mg by mouth 2 (two) times daily.     Cholecalciferol (VITAMIN D3) 125 MCG (5000 UT) CAPS Take 5,000 Units by mouth daily.     clopidogrel (PLAVIX) 75 MG tablet Take 1 tablet (75 mg total) by mouth daily. 60 tablet 3   gabapentin (NEURONTIN) 100 MG capsule Take 100 mg by mouth daily.     Insulin Glargine-yfgn 100 UNIT/ML SOPN Inject 18 Units into the skin at bedtime.     isosorbide mononitrate (IMDUR) 30 MG 24 hr tablet Take 30 mg by mouth daily.     loperamide (IMODIUM) 2 MG capsule Take 1 capsule (2 mg total) by mouth as needed for diarrhea or loose stools. 30 capsule 0   multivitamin (ONE-A-DAY MEN'S) TABS tablet Take 1 tablet by mouth daily.     pantoprazole (PROTONIX) 40 MG tablet Take 1 tablet (40 mg total) by mouth daily. 90 tablet 1   rosuvastatin (CRESTOR) 40 MG tablet Take 40 mg by mouth daily.     sertraline (ZOLOFT) 100 MG tablet Take 200 mg by mouth daily.     amLODipine (NORVASC) 10 MG  tablet Take 1 tablet (10 mg total) by mouth daily. 30 tablet 0   No current facility-administered medications on file prior to visit.    There are no Patient Instructions on file for this visit. No follow-ups on file.   Kris Hartmann, NP

## 2021-07-15 ENCOUNTER — Telehealth: Payer: Self-pay

## 2021-07-15 NOTE — Telephone Encounter (Signed)
Ty took a phone call from a woman asking if we can reschedule the patients lumbar facets from May that he missed due to being in the hospital. The order is expired and there are no other orders. If he wants him to have this done I will need a new order, or if he wants to see him for an eval first, let me know.

## 2021-07-15 NOTE — Telephone Encounter (Signed)
Bubble message sent to Dr. Dossie Arbour. Patient will need to stop Plavix/

## 2021-07-15 NOTE — Telephone Encounter (Signed)
Christina called from [facility] and advised on our vm that patient has not finished IV abx. He had 15 doses left and 8 expired doses that were having to be thrown away. They did not pull picc (07/14/2021) due to the amount of meds left over.   Called [name] (significant other) and asked why so many doses were left over and expired and she said she was sent the wrong amount. She states she has 1 left for today and 2 left for tomorrow. She said only one day was missed when he was in ED.   I called Christina with [facility] who explained that she did not want to pull picc since she has never seen so many extra doses unless they were not given. She also stated that it is possible they have been administering expired doses as well. She did report this to [facility].   I called [facility] and spoke to [facility] who said Jeani Hawking reviewed the count and there were no extra doses sent. Please advise on next steps and pull picc date for this patient given this situation.

## 2021-07-15 NOTE — Telephone Encounter (Signed)
No, he needs to come in for a F2F evaluation. We will be talking about this today in our meeting. This is Dr. Adalberto Cole response.

## 2021-07-17 ENCOUNTER — Telehealth: Payer: Self-pay

## 2021-07-17 ENCOUNTER — Other Ambulatory Visit: Payer: Self-pay

## 2021-07-17 ENCOUNTER — Ambulatory Visit: Payer: 59 | Attending: Infectious Diseases | Admitting: Infectious Diseases

## 2021-07-17 VITALS — BP 169/63 | HR 65 | Temp 98.9°F | Resp 16 | Ht 68.0 in | Wt 229.0 lb

## 2021-07-17 DIAGNOSIS — Z79899 Other long term (current) drug therapy: Secondary | ICD-10-CM | POA: Diagnosis not present

## 2021-07-17 DIAGNOSIS — L089 Local infection of the skin and subcutaneous tissue, unspecified: Secondary | ICD-10-CM | POA: Diagnosis not present

## 2021-07-17 DIAGNOSIS — E1151 Type 2 diabetes mellitus with diabetic peripheral angiopathy without gangrene: Secondary | ICD-10-CM | POA: Diagnosis not present

## 2021-07-17 DIAGNOSIS — E1169 Type 2 diabetes mellitus with other specified complication: Secondary | ICD-10-CM | POA: Diagnosis not present

## 2021-07-17 DIAGNOSIS — Z87891 Personal history of nicotine dependence: Secondary | ICD-10-CM | POA: Diagnosis not present

## 2021-07-17 DIAGNOSIS — Z7982 Long term (current) use of aspirin: Secondary | ICD-10-CM | POA: Diagnosis not present

## 2021-07-17 DIAGNOSIS — Z794 Long term (current) use of insulin: Secondary | ICD-10-CM | POA: Diagnosis not present

## 2021-07-17 DIAGNOSIS — Z89421 Acquired absence of other right toe(s): Secondary | ICD-10-CM | POA: Diagnosis not present

## 2021-07-17 DIAGNOSIS — Z881 Allergy status to other antibiotic agents status: Secondary | ICD-10-CM | POA: Diagnosis not present

## 2021-07-17 DIAGNOSIS — D649 Anemia, unspecified: Secondary | ICD-10-CM | POA: Diagnosis not present

## 2021-07-17 DIAGNOSIS — E1140 Type 2 diabetes mellitus with diabetic neuropathy, unspecified: Secondary | ICD-10-CM | POA: Insufficient documentation

## 2021-07-17 DIAGNOSIS — Z96653 Presence of artificial knee joint, bilateral: Secondary | ICD-10-CM | POA: Insufficient documentation

## 2021-07-17 DIAGNOSIS — R7881 Bacteremia: Secondary | ICD-10-CM | POA: Insufficient documentation

## 2021-07-17 DIAGNOSIS — Z09 Encounter for follow-up examination after completed treatment for conditions other than malignant neoplasm: Secondary | ICD-10-CM | POA: Insufficient documentation

## 2021-07-17 DIAGNOSIS — B9561 Methicillin susceptible Staphylococcus aureus infection as the cause of diseases classified elsewhere: Secondary | ICD-10-CM | POA: Diagnosis present

## 2021-07-17 DIAGNOSIS — Z7901 Long term (current) use of anticoagulants: Secondary | ICD-10-CM | POA: Diagnosis not present

## 2021-07-17 MED ORDER — AMOXICILLIN-POT CLAVULANATE 875-125 MG PO TABS: 1.0000 | ORAL_TABLET | Freq: Two times a day (BID) | ORAL | 0 refills | Status: DC

## 2021-07-17 NOTE — Progress Notes (Signed)
NAME: Nathan Singleton  DOB: [birth year]  MRN: [id number]  Date/Time: 07/17/2021 1:21 PM  Subjective:    Patient is here with his wife.  For follow up visit He is being treated for rt foot infection/MSSA bacteremia and pacemaker wire lead calcification  He is on cefazolin and completed 6 weeks of treatment yesterday The amputaiton of the 2 nd roe - site healed completely  The following is taken from my last note  Nathan Singleton is a 80 y.o. with a history of diabetes mellitus, peripheral arterial disease, neuropathy, CAD, hyperlipidemia, TAVR, pacemaker, bilateral knee replacement, excision of head of proximal phalanx right great toe, excision lateral condyle distal phalanx right great toe recent MSSA bacteremia and osteomyelitis in April 2022 and treated with 6 weeks of IV antibiotics followed by 2 weeks of p.o. Keflex.  He presented to the hospital with right foot pain and bleeding on 06/01/2021.  He had Staphylococcus bacteremia.  He underwent TEE on 06/04/2021 which was negative for endocarditis but showed a calcified lesion on the pacemaker wire which was seen in the prior TEE in April 2022.[place] Kitchen  Repeat blood culture from 06/03/2021 was negative.  He underwent amputation of  the second toe osteomyelitis and tissue culture was staph aureus.   .  Past Medical History:  Diagnosis Date   Aneurysm (arteriovenous) of coronary vessels    Arthritis    Ascending aortic aneurysm (HCC)    CAD (coronary artery disease)    Diabetes mellitus without complication ([place])    Hard of hearing    History of GI bleed    Hyperlipidemia associated with type 2 diabetes mellitus ([place])    Hypertension associated with diabetes ([place])    Presence of permanent cardiac pacemaker    PVD (peripheral vascular disease) ([place])    S/P TAVR (transcatheter aortic valve replacement)     Past Surgical History:  Procedure Laterality Date   AMPUTATION TOE Right 03/20/2021   Procedure: AMPUTATION TOE;  Surgeon: Sharlotte Alamo, DPM;   Location: [facility];  Service: Podiatry;  Laterality: Right;   AMPUTATION TOE Right 06/03/2021   Procedure: AMPUTATION TOE-SECOND TOE;  Surgeon: [name], DPM;  Location: [facility];  Service: Podiatry;  Laterality: Right;   Ankle repaired Right    Carpal Tunnel repaired Left    CATARACT EXTRACTION Bilateral    COLONOSCOPY     Copillar implant      DG CHOLECYSTOGRAPHY GALL BLADDER ([facility] HX)     GASTRIC BYPASS     heart valve replaced     IRRIGATION AND DEBRIDEMENT FOOT Right 02/18/2021   Procedure: IRRIGATION AND DEBRIDEMENT FOOT-Right Great Toe;  Surgeon: [name], DPM;  Location: [facility];  Service: Podiatry;  Laterality: Right;   LOWER EXTREMITY ANGIOGRAPHY Right 02/17/2021   Procedure: Lower Extremity Angiography;  Surgeon: Algernon Huxley, MD;  Location: [facility] [facility];  Service: Cardiovascular;  Laterality: Right;   LOWER EXTREMITY ANGIOGRAPHY Right 03/27/2021   Procedure: Lower Extremity Angiography;  Surgeon: Algernon Huxley, MD;  Location: [place] [facility];  Service: Cardiovascular;  Laterality: Right;   PACEMAKER GENERATOR CHANGE     REPLACEMENT TOTAL KNEE BILATERAL     RTC     STENT PLACE LEFT URETER ([place] HX) Right    Right Leg   TEE WITHOUT CARDIOVERSION N/A 03/26/2021   Procedure: TRANSESOPHAGEAL ECHOCARDIOGRAM (TEE);  Surgeon: Kate Sable, MD;  Location: [facility];  Service: Cardiovascular;  Laterality: N/A;   TEE WITHOUT CARDIOVERSION N/A 06/04/2021   Procedure: TRANSESOPHAGEAL ECHOCARDIOGRAM (  TEE);  Surgeon: Kate Sable, MD;  Location: [facility];  Service: Cardiovascular;  Laterality: N/A;   Toe nail removed Bilateral     Social History   Socioeconomic History   Marital status: Single    Spouse name: Not on file   Number of children: Not on file   Years of education: Not on file   Highest education level: Not on file  Occupational History   Not on file  Tobacco Use   Smoking status: Former   Smokeless tobacco: Never  Substance and Sexual  Activity   Alcohol use: Not Currently   Drug use: Not Currently   Sexual activity: Not on file  Other Topics Concern   Not on file  Social History Narrative   Not on file   Social Determinants of Health   Financial Resource Strain: Not on file  Food Insecurity: Not on file  Transportation Needs: Not on file  Physical Activity: Not on file  Stress: Not on file  Social Connections: Not on file  Intimate Partner Violence: Not on file    Family History  Problem Relation Age of Onset   Heart disease Mother    Alzheimer's disease Mother    Heart disease Father    Diabetes Father    Allergies  Allergen Reactions   Atorvastatin Other (See Comments)    "bad for kidneys" Cramping    Celecoxib Other (See Comments)    Other reaction(s): Other (See Comments) Kidney Problem Kidney Problem    Dicyclomine Hcl Other (See Comments)    Stomach cramps   Doxycycline Other (See Comments)    Per wife (per RN)- pt gets severe stomach pains even when takes with food and PCP said to not take     Metformin Other (See Comments)   I? Current Outpatient Medications  Medication Sig Dispense Refill   acetaminophen (TYLENOL) 325 MG tablet Take 2 tablets (650 mg total) by mouth every 6 (six) hours as needed for fever, headache or moderate pain.     albuterol (VENTOLIN HFA) 108 (90 Base) MCG/ACT inhaler Inhale 2 puffs into the lungs every 4 (four) hours as needed for wheezing or shortness of breath. 18 g 0   allopurinol (ZYLOPRIM) 100 MG tablet Take 100 mg by mouth daily.     BAYER ASPIRIN EC LOW DOSE 81 MG EC tablet Take 81 mg by mouth daily.     carvedilol (COREG) 3.125 MG tablet Take 3.125 mg by mouth 2 (two) times daily.     Cholecalciferol (VITAMIN D3) 125 MCG (5000 UT) CAPS Take 5,000 Units by mouth daily.     clopidogrel (PLAVIX) 75 MG tablet Take 1 tablet (75 mg total) by mouth daily. 60 tablet 3   gabapentin (NEURONTIN) 100 MG capsule Take 100 mg by mouth daily.     Insulin  Glargine-yfgn 100 UNIT/ML SOPN Inject 18 Units into the skin at bedtime.     isosorbide mononitrate (IMDUR) 30 MG 24 hr tablet Take 30 mg by mouth daily.     loperamide (IMODIUM) 2 MG capsule Take 1 capsule (2 mg total) by mouth as needed for diarrhea or loose stools. 30 capsule 0   multivitamin (ONE-A-DAY MEN'S) TABS tablet Take 1 tablet by mouth daily.     pantoprazole (PROTONIX) 40 MG tablet Take 1 tablet (40 mg total) by mouth daily. 90 tablet 1   rosuvastatin (CRESTOR) 40 MG tablet Take 40 mg by mouth daily.     sertraline (ZOLOFT) 100 MG tablet Take 200 mg  by mouth daily.     amLODipine (NORVASC) 10 MG tablet Take 1 tablet (10 mg total) by mouth daily. 30 tablet 0   No current facility-administered medications for this visit.     Abtx:  Anti-infectives (From admission, onward)    None       REVIEW OF SYSTEMS:  Const: negative fever, negative chills, negative weight loss Eyes: negative diplopia or visual changes, negative eye pain ENT: negative coryza, negative sore throat, hard of hearing Resp: negative cough, hemoptysis, dyspnea Cards: negative for chest pain, palpitations, lower extremity edema GU: negative for frequency, dysuria and hematuria GI: Negative for abdominal pain, diarrhea, bleeding, constipation Skin: his 63 month old cat nipped him and there isa small pustular lesion on the rt forearm Heme: easy bruising MS: negative for myalgias, arthralgias, back pain and muscle weakness Neurolo:negative for headaches, dizziness, vertigo, memory problems  Psych: negative for feelings of anxiety, depression  Endocrine: negative for thyroid, diabetes Allergy/Immunology-allergies as above Objective:  VITALS:  BP (!) 169/63   Pulse 65   Temp 98.9 F (37.2 C) (Oral)   Resp 16   Ht '5\' 8"'$  (1.727 m)   Wt 229 lb (103.9 kg)   SpO2 99%   BMI 34.82 kg/m  PHYSICAL EXAM:  General: pt ambulating, looks well  Head: Normocephalic, without obvious abnormality, atraumatic. Eyes:  Conjunctivae clear, anicteric sclerae. Pupils are equal ENT Nares normal. No drainage or sinus tenderness. Lips, mucosa, and tongue normal. No Thrush Neck: Supple, symmetrical, no adenopathy, thyroid: non tender no carotid bruit and no JVD. Chest CTA Heart: Regular rate and rhythm, no murmur, rub or gallop. Pacemaker site clean Abdomen: soft Extremities: Right foot.  Second toe amputation site has scab.  No erythema.  Just venous staining of the foot and the leg..       Right PICC site clean.-- PICC line removed- 41 cm line   Skin: No rashes or lesions. Or bruising Lymph: Cervical, supraclavicular normal. Neurologic: Grossly non-focal   ? Impression/Recommendation ?MSSA bacteremia secondary to rt foot infection. Completed 6 weeks of IV cefazolin yesterday. 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 Healed completely  Anemia Also has mild leucopenia/thrombocytopenia- follow with PCP  Cath bite with pustular lesion rt forearm - start augmentin for 7 days  History of b/l knee replacements  PAD  ? ___________________________________________________ Discussed with patient,and his wife in detail Follow when needed

## 2021-07-17 NOTE — Telephone Encounter (Signed)
Called [facility]  and gave verbal to [facility] to DC abx and we will PULL PICC since [facility] wants to wait until Monday.

## 2021-07-17 NOTE — Patient Instructions (Addendum)
Today you are here fir follow up of the foot infection( rt) which ahs healed completely- you have completed 6 weeks of Iv antibiotic Since cefazolin- we can remove PICC today ( done) since you have a cat bite I am giving you a 7 day course of antibiotic augmentin BID

## 2021-07-25 ENCOUNTER — Other Ambulatory Visit: Payer: Self-pay

## 2021-07-25 ENCOUNTER — Emergency Department: Payer: 59

## 2021-07-25 ENCOUNTER — Encounter: Payer: Self-pay | Admitting: Emergency Medicine

## 2021-07-25 ENCOUNTER — Observation Stay
Admission: EM | Admit: 2021-07-25 | Discharge: 2021-07-26 | Disposition: A | Discharge status: Home / Self Care | Payer: 59 | Attending: Internal Medicine | Admitting: Internal Medicine

## 2021-07-25 DIAGNOSIS — I10 Essential (primary) hypertension: Secondary | ICD-10-CM

## 2021-07-25 DIAGNOSIS — E1122 Type 2 diabetes mellitus with diabetic chronic kidney disease: Secondary | ICD-10-CM | POA: Insufficient documentation

## 2021-07-25 DIAGNOSIS — Z20822 Contact with and (suspected) exposure to covid-19: Secondary | ICD-10-CM | POA: Insufficient documentation

## 2021-07-25 DIAGNOSIS — D631 Anemia in chronic kidney disease: Secondary | ICD-10-CM | POA: Diagnosis not present

## 2021-07-25 DIAGNOSIS — Z87891 Personal history of nicotine dependence: Secondary | ICD-10-CM | POA: Diagnosis not present

## 2021-07-25 DIAGNOSIS — D638 Anemia in other chronic diseases classified elsewhere: Secondary | ICD-10-CM | POA: Diagnosis present

## 2021-07-25 DIAGNOSIS — Z96653 Presence of artificial knee joint, bilateral: Secondary | ICD-10-CM | POA: Diagnosis not present

## 2021-07-25 DIAGNOSIS — N1831 Chronic kidney disease, stage 3a: Secondary | ICD-10-CM | POA: Diagnosis not present

## 2021-07-25 DIAGNOSIS — Z794 Long term (current) use of insulin: Secondary | ICD-10-CM | POA: Insufficient documentation

## 2021-07-25 DIAGNOSIS — M86171 Other acute osteomyelitis, right ankle and foot: Principal | ICD-10-CM

## 2021-07-25 DIAGNOSIS — I459 Conduction disorder, unspecified: Secondary | ICD-10-CM

## 2021-07-25 DIAGNOSIS — I129 Hypertensive chronic kidney disease with stage 1 through stage 4 chronic kidney disease, or unspecified chronic kidney disease: Secondary | ICD-10-CM | POA: Diagnosis not present

## 2021-07-25 DIAGNOSIS — N189 Chronic kidney disease, unspecified: Secondary | ICD-10-CM | POA: Diagnosis present

## 2021-07-25 DIAGNOSIS — Z79899 Other long term (current) drug therapy: Secondary | ICD-10-CM | POA: Diagnosis not present

## 2021-07-25 DIAGNOSIS — I712 Thoracic aortic aneurysm, without rupture: Secondary | ICD-10-CM | POA: Diagnosis present

## 2021-07-25 DIAGNOSIS — J449 Chronic obstructive pulmonary disease, unspecified: Secondary | ICD-10-CM | POA: Insufficient documentation

## 2021-07-25 DIAGNOSIS — Z950 Presence of cardiac pacemaker: Secondary | ICD-10-CM | POA: Diagnosis not present

## 2021-07-25 DIAGNOSIS — I25709 Atherosclerosis of coronary artery bypass graft(s), unspecified, with unspecified angina pectoris: Secondary | ICD-10-CM | POA: Diagnosis present

## 2021-07-25 DIAGNOSIS — M79674 Pain in right toe(s): Secondary | ICD-10-CM | POA: Diagnosis present

## 2021-07-25 DIAGNOSIS — Z7902 Long term (current) use of antithrombotics/antiplatelets: Secondary | ICD-10-CM | POA: Diagnosis not present

## 2021-07-25 DIAGNOSIS — I7121 Aneurysm of the ascending aorta, without rupture: Secondary | ICD-10-CM | POA: Diagnosis present

## 2021-07-25 DIAGNOSIS — E785 Hyperlipidemia, unspecified: Secondary | ICD-10-CM | POA: Diagnosis present

## 2021-07-25 DIAGNOSIS — I2510 Atherosclerotic heart disease of native coronary artery without angina pectoris: Secondary | ICD-10-CM | POA: Insufficient documentation

## 2021-07-25 DIAGNOSIS — L0390 Cellulitis, unspecified: Secondary | ICD-10-CM | POA: Diagnosis present

## 2021-07-25 DIAGNOSIS — F32A Depression, unspecified: Secondary | ICD-10-CM

## 2021-07-25 DIAGNOSIS — E782 Mixed hyperlipidemia: Secondary | ICD-10-CM

## 2021-07-25 DIAGNOSIS — E11621 Type 2 diabetes mellitus with foot ulcer: Secondary | ICD-10-CM | POA: Diagnosis present

## 2021-07-25 DIAGNOSIS — M869 Osteomyelitis, unspecified: Secondary | ICD-10-CM

## 2021-07-25 DIAGNOSIS — Z7901 Long term (current) use of anticoagulants: Secondary | ICD-10-CM

## 2021-07-25 DIAGNOSIS — R7881 Bacteremia: Secondary | ICD-10-CM | POA: Diagnosis present

## 2021-07-25 LAB — CBC WITH DIFFERENTIAL/PLATELET
Abs Immature Granulocytes: 0.01 10*3/uL (ref 0.00–0.07)
Basophils Absolute: 0 10*3/uL (ref 0.0–0.1)
Basophils Relative: 1 %
Eosinophils Absolute: 0.2 10*3/uL (ref 0.0–0.5)
Eosinophils Relative: 5 %
HCT: 28.6 % — ABNORMAL LOW (ref 39.0–52.0)
Hemoglobin: 9.7 g/dL — ABNORMAL LOW (ref 13.0–17.0)
Immature Granulocytes: 0 %
Lymphocytes Relative: 24 %
Lymphs Abs: 0.7 10*3/uL (ref 0.7–4.0)
MCH: 31.5 pg (ref 26.0–34.0)
MCHC: 33.9 g/dL (ref 30.0–36.0)
MCV: 92.9 fL (ref 80.0–100.0)
Monocytes Absolute: 0.2 10*3/uL (ref 0.1–1.0)
Monocytes Relative: 7 %
Neutro Abs: 1.8 10*3/uL (ref 1.7–7.7)
Neutrophils Relative %: 63 %
Platelets: 103 10*3/uL — ABNORMAL LOW (ref 150–400)
RBC: 3.08 MIL/uL — ABNORMAL LOW (ref 4.22–5.81)
RDW: 14.6 % (ref 11.5–15.5)
WBC: 2.8 10*3/uL — ABNORMAL LOW (ref 4.0–10.5)
nRBC: 0 % (ref 0.0–0.2)

## 2021-07-25 LAB — BASIC METABOLIC PANEL
Anion gap: 8 (ref 5–15)
BUN: 32 mg/dL — ABNORMAL HIGH (ref 8–23)
CO2: 23 mmol/L (ref 22–32)
Calcium: 8.7 mg/dL — ABNORMAL LOW (ref 8.9–10.3)
Chloride: 107 mmol/L (ref 98–111)
Creatinine, Ser: 1.23 mg/dL (ref 0.61–1.24)
GFR, Estimated: 60 mL/min — ABNORMAL LOW (ref >60)
Glucose, Bld: 228 mg/dL — ABNORMAL HIGH (ref 70–99)
Potassium: 4.6 mmol/L (ref 3.5–5.1)
Sodium: 138 mmol/L (ref 135–145)

## 2021-07-25 LAB — RESP PANEL BY RT-PCR (FLU A&B, COVID) ARPGX2
Influenza A by PCR: NEGATIVE
Influenza B by PCR: NEGATIVE
SARS Coronavirus 2 by RT PCR: NEGATIVE

## 2021-07-25 LAB — SEDIMENTATION RATE: Sed Rate: 54 mm/hr — ABNORMAL HIGH (ref 0–20)

## 2021-07-25 LAB — LACTIC ACID, PLASMA: Lactic Acid, Venous: 1.1 mmol/L (ref 0.5–1.9)

## 2021-07-25 LAB — HEMOGLOBIN A1C
Hgb A1c MFr Bld: 7 % — ABNORMAL HIGH (ref 4.8–5.6)
Mean Plasma Glucose: 154.2 mg/dL

## 2021-07-25 LAB — GLUCOSE, CAPILLARY
Glucose-Capillary: 100 mg/dL — ABNORMAL HIGH (ref 70–99)
Glucose-Capillary: 191 mg/dL — ABNORMAL HIGH (ref 70–99)

## 2021-07-25 MED ORDER — ONDANSETRON HCL 4 MG/2ML IJ SOLN
4.0000 mg | Freq: Four times a day (QID) | INTRAMUSCULAR | Status: DC | PRN
Start: 2021-07-25 — End: 2021-07-26

## 2021-07-25 MED ORDER — AMLODIPINE BESYLATE 10 MG PO TABS
10.0000 mg | ORAL_TABLET | Freq: Every day | ORAL | Status: DC
  Administered 2021-07-26: 10 mg via ORAL
  Filled 2021-07-25: qty 1

## 2021-07-25 MED ORDER — ALBUTEROL SULFATE HFA 108 (90 BASE) MCG/ACT IN AERS: 2.0000 | INHALATION_SPRAY | RESPIRATORY_TRACT | Status: DC | PRN

## 2021-07-25 MED ORDER — ALLOPURINOL 100 MG PO TABS
100.0000 mg | ORAL_TABLET | Freq: Every day | ORAL | Status: DC
  Administered 2021-07-26: 100 mg via ORAL
  Filled 2021-07-25 (×2): qty 1

## 2021-07-25 MED ORDER — ACETAMINOPHEN 325 MG PO TABS: 650.0000 mg | ORAL_TABLET | Freq: Four times a day (QID) | ORAL | Status: DC | PRN

## 2021-07-25 MED ORDER — ISOSORBIDE MONONITRATE ER 30 MG PO TB24
30.0000 mg | ORAL_TABLET | Freq: Every day | ORAL | Status: DC
  Administered 2021-07-26: 30 mg via ORAL
  Filled 2021-07-25: qty 1

## 2021-07-25 MED ORDER — CARVEDILOL 3.125 MG PO TABS
3.1250 mg | ORAL_TABLET | Freq: Two times a day (BID) | ORAL | Status: DC
  Administered 2021-07-26: 3.125 mg via ORAL
  Filled 2021-07-25: qty 1

## 2021-07-25 MED ORDER — VANCOMYCIN HCL 1250 MG/250ML IV SOLN: 1250.0000 mg | Freq: Once | INTRAVENOUS | Status: DC

## 2021-07-25 MED ORDER — VANCOMYCIN HCL IN DEXTROSE 1-5 GM/200ML-% IV SOLN
1000.0000 mg | Freq: Once | INTRAVENOUS | Status: AC
  Administered 2021-07-25: 1000 mg via INTRAVENOUS
  Filled 2021-07-25: qty 200

## 2021-07-25 MED ORDER — HYDRALAZINE HCL 10 MG PO TABS
10.0000 mg | ORAL_TABLET | Freq: Four times a day (QID) | ORAL | Status: DC | PRN
  Filled 2021-07-25 (×2): qty 1

## 2021-07-25 MED ORDER — ACETAMINOPHEN 500 MG PO TABS
1000.0000 mg | ORAL_TABLET | Freq: Once | ORAL | Status: AC
  Administered 2021-07-25: 1000 mg via ORAL
  Filled 2021-07-25: qty 2

## 2021-07-25 MED ORDER — HEPARIN SODIUM (PORCINE) 5000 UNIT/ML IJ SOLN
5000.0000 [IU] | Freq: Three times a day (TID) | INTRAMUSCULAR | Status: DC
  Administered 2021-07-25: 5000 [IU] via SUBCUTANEOUS
  Filled 2021-07-25 (×2): qty 1

## 2021-07-25 MED ORDER — MORPHINE SULFATE (PF) 2 MG/ML IV SOLN: 2.0000 mg | INTRAVENOUS | Status: DC | PRN

## 2021-07-25 MED ORDER — SODIUM CHLORIDE 0.9 % IV SOLN
2.0000 g | Freq: Two times a day (BID) | INTRAVENOUS | Status: DC
  Administered 2021-07-25 – 2021-07-26 (×2): 2 g via INTRAVENOUS
  Filled 2021-07-25 (×3): qty 2

## 2021-07-25 MED ORDER — INSULIN GLARGINE-YFGN 100 UNIT/ML ~~LOC~~ SOPN
18.0000 [IU] | PEN_INJECTOR | Freq: Every day | SUBCUTANEOUS | Status: DC
  Filled 2021-07-25 (×2): qty 3

## 2021-07-25 MED ORDER — ONDANSETRON HCL 4 MG PO TABS: 4.0000 mg | ORAL_TABLET | Freq: Four times a day (QID) | ORAL | Status: DC | PRN

## 2021-07-25 MED ORDER — PANTOPRAZOLE SODIUM 40 MG PO TBEC
40.0000 mg | DELAYED_RELEASE_TABLET | Freq: Every day | ORAL | Status: DC
  Administered 2021-07-26: 40 mg via ORAL
  Filled 2021-07-25: qty 1

## 2021-07-25 MED ORDER — ALBUTEROL SULFATE (2.5 MG/3ML) 0.083% IN NEBU: 2.5000 mg | INHALATION_SOLUTION | RESPIRATORY_TRACT | Status: DC | PRN

## 2021-07-25 MED ORDER — INSULIN ASPART 100 UNIT/ML IJ SOLN: 0.0000 [IU] | Freq: Every day | INTRAMUSCULAR | Status: DC

## 2021-07-25 MED ORDER — VANCOMYCIN HCL IN DEXTROSE 1-5 GM/200ML-% IV SOLN
1000.0000 mg | INTRAVENOUS | Status: DC
  Filled 2021-07-25: qty 200

## 2021-07-25 MED ORDER — GABAPENTIN 100 MG PO CAPS
100.0000 mg | ORAL_CAPSULE | Freq: Every day | ORAL | Status: DC
  Administered 2021-07-26: 100 mg via ORAL
  Filled 2021-07-25: qty 1

## 2021-07-25 MED ORDER — INSULIN ASPART 100 UNIT/ML IJ SOLN: 0.0000 [IU] | Freq: Three times a day (TID) | INTRAMUSCULAR | Status: DC

## 2021-07-25 MED ORDER — ROSUVASTATIN CALCIUM 10 MG PO TABS
40.0000 mg | ORAL_TABLET | Freq: Every day | ORAL | Status: DC
  Administered 2021-07-25: 40 mg via ORAL
  Filled 2021-07-25: qty 4
  Filled 2021-07-25: qty 2

## 2021-07-25 MED ORDER — SODIUM CHLORIDE 0.9 % IV SOLN
1.0000 g | Freq: Once | INTRAVENOUS | Status: AC
  Administered 2021-07-25: 1 g via INTRAVENOUS
  Filled 2021-07-25: qty 10

## 2021-07-25 MED ORDER — MORPHINE SULFATE (PF) 2 MG/ML IV SOLN
0.5000 mg | INTRAVENOUS | Status: DC | PRN
  Administered 2021-07-25 – 2021-07-26 (×2): 0.5 mg via INTRAVENOUS
  Filled 2021-07-25 (×2): qty 1

## 2021-07-25 MED ORDER — VANCOMYCIN HCL 1250 MG/250ML IV SOLN
1250.0000 mg | Freq: Once | INTRAVENOUS | Status: AC
  Administered 2021-07-25: 1250 mg via INTRAVENOUS
  Filled 2021-07-25 (×2): qty 250

## 2021-07-25 MED ORDER — INSULIN GLARGINE-YFGN 100 UNIT/ML ~~LOC~~ SOLN
18.0000 [IU] | Freq: Every day | SUBCUTANEOUS | Status: DC
  Administered 2021-07-25: 18 [IU] via SUBCUTANEOUS
  Filled 2021-07-25 (×2): qty 0.18

## 2021-07-25 MED ORDER — ACETAMINOPHEN 650 MG RE SUPP: 650.0000 mg | Freq: Four times a day (QID) | RECTAL | Status: DC | PRN

## 2021-07-25 MED ORDER — POVIDONE-IODINE 10 % EX SWAB: 2.0000 "application " | Freq: Once | CUTANEOUS | Status: DC

## 2021-07-25 MED ORDER — CHLORHEXIDINE GLUCONATE 4 % EX LIQD
60.0000 mL | Freq: Once | CUTANEOUS | Status: AC
  Administered 2021-07-26: 4 via TOPICAL

## 2021-07-25 MED ORDER — VITAMIN D 25 MCG (1000 UNIT) PO TABS
5000.0000 [IU] | ORAL_TABLET | Freq: Every day | ORAL | Status: DC
  Administered 2021-07-26: 5000 [IU] via ORAL
  Filled 2021-07-25: qty 5

## 2021-07-25 MED ORDER — SERTRALINE HCL 50 MG PO TABS
200.0000 mg | ORAL_TABLET | Freq: Every day | ORAL | Status: DC
  Administered 2021-07-26: 200 mg via ORAL
  Filled 2021-07-25: qty 4

## 2021-07-25 NOTE — ED Notes (Signed)
Prn bp meds requested from pharmacy to send to floor.

## 2021-07-25 NOTE — Progress Notes (Signed)
Pharmacy Antibiotic Note  Nathan Singleton is a 80 y.o. male admitted on 07/25/2021 who complained of worsening pain and swelling of his R foot. Pt previously had MSSA from R 2nd toe culture from 06/03/2021 and MSSA bacteremia 06/01/2021. Pharmacy has been consulted for cefepime and vancomycin dosing for osteomyelitis. Podiatry recommended amputation of distal R third toe; possible surgery tomorrow (8/13).  Plan: Cefepime 2 g IV q12h  Pt received 2250 mg IV loading dose. Will order 1000 mg IV q24 hour maintenance dose Est AUC 446.3 Scr 1.23, Vd 0.5 L/kg, IBW for CrCl/Ke Obtain vancomycin levels around 4th or 5th dose if continued  Monitor renal function and adjust dose as clinically indicated Follow up cultures and de-escalate as able   Height: '5\' 8"'$  (172.7 cm) Weight: 103 kg (227 lb) IBW/kg (Calculated) : 68.4  Temp (24hrs), Avg:98.6 F (37 C), Min:98.4 F (36.9 C), Max:98.8 F (37.1 C)  Recent Labs  Lab 07/25/21 1317 07/25/21 1328  WBC  --  2.8*  CREATININE  --  1.23  LATICACIDVEN 1.1  --     Estimated Creatinine Clearance: 56.6 mL/min (by C-G formula based on SCr of 1.23 mg/dL).    Allergies  Allergen Reactions   Atorvastatin Other (See Comments)    "bad for kidneys" Cramping    Celecoxib Other (See Comments)    Other reaction(s): Other (See Comments) Kidney Problem Kidney Problem    Dicyclomine Hcl Other (See Comments)    Stomach cramps   Doxycycline Other (See Comments)    Per wife (per RN)- pt gets severe stomach pains even when takes with food and PCP said to not take     Metformin Other (See Comments)    Antimicrobials this admission: 8/12 ceftriaxone x1 8/12 vancomycin >> 8/12 cefepime >>  Dose adjustments this admission:   Microbiology results: 8/12 BCx: sent 8/12 MRSA PCR: sent  Thank you for allowing pharmacy to be a part of this patient's care.  Forde Dandy Bladen Umar 07/25/2021 3:33 PM

## 2021-07-25 NOTE — Progress Notes (Signed)
Pt admitted to room 150, call bell in reach, wife at bedside. VSS.   07/25/21 1608  Assess: MEWS Score  Temp 97.7 F (36.5 C)  BP (!) 159/68  Pulse Rate 60  Resp 16  SpO2 100 %  O2 Device Room Air

## 2021-07-25 NOTE — ED Provider Notes (Signed)
[facility] Emergency Department Provider Note ____________________________________________   Event Date/Time   First MD Initiated Contact with Patient 07/25/21 1241     (approximate)  I have reviewed the triage vital signs and the nursing notes.  HISTORY  Chief Complaint Foot Pain   HPI Nathan Singleton is a 80 y.o. malewho presents to the ED for evaluation of foot and toe pain.   Chart review indicates history of PAD and CAD.  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  HLD, DM, HTN.  GERD. April 2022 osteomyelitis and MSSA bacteremia from right great toe wound.  Patient presents to the ED, accompanied by his wife, for evaluation of about 2 days of worsening right third toe pain.  He reports a soreness to his right third toe and to his distal foot just proximal to this.  Denies any trauma or injuries.  Denies fevers or systemic symptoms.  Reports mild pain that is 3-4/10 intensity.  No other complaints.  Denies systemic symptoms such as nausea, dizziness, fever.  They do report a scaling lesion/wound to his right third toe seems to be getting worse over the past week or so.  Past Medical History:  Diagnosis Date   Aneurysm (arteriovenous) of coronary vessels    Arthritis    Ascending aortic aneurysm (HCC)    CAD (coronary artery disease)    Diabetes mellitus without complication (HCC)    Hard of hearing    History of GI bleed    Hyperlipidemia associated with type 2 diabetes mellitus ([place])    Hypertension associated with diabetes ([place])    Presence of permanent cardiac pacemaker    PVD (peripheral vascular disease) ([place])    S/P TAVR (transcatheter aortic valve replacement)     Patient Active Problem List   Diagnosis Date Noted   AKI (acute kidney injury) ([place]) 06/27/2021   AMS (altered mental status) 06/27/2021   Edema, unspecified 06/25/2021   Proteinuria, unspecified 06/25/2021   Bacteremia    Osteomyelitis ([place]) 06/01/2021   Syncope and collapse  05/25/2021   Spondylosis without myelopathy or radiculopathy, lumbosacral region 05/12/2021   Orthostatic hypotension    Weakness    CAP (community acquired pneumonia) 05/06/2021   Syncope, vasovagal 05/06/2021   CKD (chronic kidney disease), stage IIIa 05/06/2021   Diarrhea 05/06/2021   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/06/2021   Osteoarthritis of facet joint at L5-S1 level of lumbosacral spine 04/22/2021   Lumbar facet joint syndrome (Bilateral) (L>R) 04/22/2021   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HCC)    MSSA bacteremia    Anemia of chronic disease    Cellulitis of second toe of right foot 03/20/2021   Type 2 diabetes mellitus with hyperlipidemia ([place]) 03/20/2021   CKD stage 3 due to type 2 diabetes mellitus ([place]) 03/20/2021   Dizziness 03/20/2021   Chronic pain syndrome 03/03/2021   Pharmacologic therapy 03/03/2021   Disorder of skeletal system 03/03/2021   Problems influencing health status 55/97/4163   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[place]) 03/03/2021   Chronic lower extremity pain (2ry area of Pain) (Bilateral) (R>L) 03/03/2021   Chronic anticoagulation (Plavix) 03/03/2021   Chronic shoulder pain (3ry area of Pain) (Left) 03/03/2021   History of total knee replacement (Bilateral) 03/03/2021   DDD (degenerative disc disease), thoracic 03/03/2021   Cervical facet hypertrophy (Multilevel) (Bilateral) 03/03/2021   DDD (degenerative disc disease), cervical 03/03/2021   BPH (benign prostatic hyperplasia) 02/14/2021   Diabetic foot ulcer ([place]) 02/13/2021   Normocytic anemia 11/26/2020   Chronic  kidney disease (CKD) 11/26/2020   Claudication ([place]) 11/26/2020   Contusion of right thigh 11/26/2020   COPD (chronic obstructive pulmonary disease) ([place]) 11/26/2020   Depression 11/26/2020   Dyspnea 11/26/2020   Erectile dysfunction 11/26/2020   GERD without esophagitis 11/26/2020   Gout 11/26/2020   Hyperlipidemia, acquired 11/26/2020   Hyperlipidemia 11/26/2020   Myocardial infarction  ([place]) 11/26/2020   Personal history of other diseases of urinary system 11/26/2020   Right leg swelling 11/26/2020   Sleep apnea 11/26/2020   Varicose veins 11/26/2020   Venous insufficiency 11/26/2020   Vitamin D deficiency 11/26/2020   Chest pain 11/21/2020   Thrombocytopenia ([place]) 11/21/2020   S/P TAVR (transcatheter aortic valve replacement)    PVD (peripheral vascular disease) ([place])    Presence of permanent cardiac pacemaker    Hypertension associated with diabetes ([place])    Hyperlipidemia associated with type 2 diabetes mellitus ([place])    History of GI bleed    CAD (coronary artery disease)    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   Heart block 04/25/2018   Calculus of gallbladder with acute cholecystitis without obstruction 09/21/2017   Gastrojejunal ulcer 03/23/2014   Insomnia 03/22/2014   Blisters of multiple sites 01/18/2014   Cellulitis 01/18/2014   OA (osteoarthritis) of knee  06/09/2013   Status post percutaneous transluminal coronary angioplasty 01/14/2011    Past Surgical History:  Procedure Laterality Date   AMPUTATION TOE Right 03/20/2021   Procedure: AMPUTATION TOE;  Surgeon: Sharlotte Alamo, DPM;  Location: [facility] ORS;  Service: Podiatry;  Laterality: Right;   AMPUTATION TOE Right 06/03/2021   Procedure: AMPUTATION TOE-SECOND TOE;  Surgeon: [name], DPM;  Location: [facility] ORS;  Service: Podiatry;  Laterality: Right;   Ankle repaired Right    Carpal Tunnel repaired Left    CATARACT EXTRACTION Bilateral    COLONOSCOPY     Copillar implant      DG CHOLECYSTOGRAPHY GALL BLADDER ([facility] HX)     GASTRIC BYPASS     heart valve replaced     IRRIGATION AND DEBRIDEMENT FOOT Right 02/18/2021   Procedure: IRRIGATION AND DEBRIDEMENT FOOT-Right Great Toe;  Surgeon: [name], DPM;  Location: [facility] ORS;  Service: Podiatry;  Laterality: Right;   LOWER EXTREMITY ANGIOGRAPHY Right 02/17/2021   Procedure: Lower Extremity Angiography;  Surgeon: Algernon Huxley, MD;  [facility];  Service: Cardiovascular;  Laterality: Right;   LOWER EXTREMITY ANGIOGRAPHY Right 03/27/2021   Procedure: Lower Extremity Angiography;  Surgeon: Algernon Huxley, MD;  Location: [facility];  Service: Cardiovascular;  Laterality: Right;   PACEMAKER GENERATOR CHANGE     REPLACEMENT TOTAL KNEE BILATERAL     RTC     STENT PLACE LEFT URETER ([place] HX) Right    Right Leg   TEE WITHOUT CARDIOVERSION N/A 03/26/2021   Procedure: TRANSESOPHAGEAL ECHOCARDIOGRAM (TEE);  Surgeon: Kate Sable, MD;  Location: [facility] OR[facility];  Service: Cardiovascular;  Laterality: N/A;   TEE WITHOUT CARDIOVERSION N/A 06/04/2021   Procedure: TRANSESOPHAGEAL ECHOCARDIOGRAM (TEE);  Surgeon: Kate Sable, MD;  Location: [facility] OR[facility];  Service: Cardiovascular;  Laterality: N/A;   Toe nail removed Bilateral     Prior to Admission medications   Medication Sig Start Date End Date Taking? Authorizing Provider   acetaminophen (TYLENOL) 325 MG tablet Take 2 tablets (650 mg total) by mouth every 6 (six) hours as needed for fever, headache or moderate pain. 05/09/21   Loletha Grayer, MD  albuterol (VENTOLIN HFA) 108 (90 Base) MCG/ACT inhaler Inhale 2 puffs into the lungs every 4 (four) hours as needed for wheezing or shortness of breath. 05/09/21   Loletha Grayer, MD  allopurinol (ZYLOPRIM) 100 MG tablet Take 100 mg by mouth daily. 10/19/20   [provider]  amLODipine (NORVASC) 10 MG tablet Take 1 tablet (10 mg total) by mouth daily. 05/28/21 06/27/21  Nolberto Hanlon, MD  amoxicillin-clavulanate (AUGMENTIN) 875-125 MG tablet Take 1 tablet by mouth 2 (two) times daily. 07/17/21   Tsosie Billing, MD  BAYER ASPIRIN EC LOW DOSE 81 MG EC tablet Take 81 mg by mouth daily. 11/11/20   [provider]  carvedilol (COREG) 3.125 MG tablet Take 3.125 mg by mouth 2 (two) times daily. 05/22/21   [provider]  Cholecalciferol (VITAMIN D3) 125 MCG (5000 UT) CAPS Take 5,000 Units by mouth daily. 05/09/21   [provider]  clopidogrel (PLAVIX) 75 MG tablet Take 1 tablet (75 mg total) by mouth daily. 02/22/21   Charlynne Cousins, MD  gabapentin (NEURONTIN) 100 MG capsule Take 100 mg by mouth daily. 01/29/21   [provider]  Insulin Glargine-yfgn 100 UNIT/ML SOPN Inject 18 Units into the skin at bedtime. 05/09/21   [provider]  isosorbide mononitrate (IMDUR) 30 MG 24 hr tablet Take 30 mg by mouth daily. 02/12/21   [provider]  loperamide (IMODIUM) 2 MG capsule Take 1 capsule (2 mg total) by mouth as needed for diarrhea or loose stools. 03/31/21   Blake Divine, MD  multivitamin (ONE-A-DAY MEN'S) TABS tablet Take 1 tablet by mouth daily. 11/11/20   [provider]  pantoprazole (PROTONIX) 40 MG tablet Take 1 tablet (40 mg total) by mouth daily. 02/11/21   [name], MD  rosuvastatin (CRESTOR) 40 MG tablet Take 40 mg by mouth daily. 02/13/21    [provider]  sertraline (ZOLOFT) 100 MG tablet Take 200 mg by mouth daily. 11/11/20   [provider]    Allergies Atorvastatin, Celecoxib, Dicyclomine hcl, Doxycycline, and Metformin  Family History  Problem Relation Age of Onset   Heart disease Mother    Alzheimer's disease Mother    Heart disease Father    Diabetes Father     Social History Social History   Tobacco Use   Smoking status: Former   Smokeless tobacco: Never  Substance Use Topics   Alcohol use: Not Currently   Drug use: Not Currently    Review of Systems  Constitutional: No fever/chills Eyes: No visual changes. ENT: No sore throat. Cardiovascular: Denies chest pain. Respiratory:  Denies shortness of breath. Gastrointestinal: No abdominal pain.  No nausea, no vomiting.  No diarrhea.  No constipation. Genitourinary: Negative for dysuria. Musculoskeletal: Negative for back pain. Positive for atraumatic right third toe pain. Skin: Negative for rash. Neurological: Negative for headaches, focal weakness or numbness.  ____________________________________________   PHYSICAL EXAM:  VITAL SIGNS: Vitals:   07/25/21 1223 07/25/21 1330  BP: (!) 155/60 (!) 153/89  Pulse: 73 60  Resp: 18 20  Temp: 98.4 F (36.9 C)   SpO2: 100% 100%    Constitutional: Alert and oriented. Well appearing and in no acute distress. Eyes: Conjunctivae are normal. PERRL. EOMI. Head: Atraumatic. Nose: No congestion/rhinnorhea. Mouth/Throat: Mucous membranes are moist.  Oropharynx non-erythematous. Neck: No stridor. No cervical spine tenderness to palpation. Cardiovascular: Normal rate, regular rhythm. Grossly normal heart sounds.  Good peripheral circulation. Respiratory: Normal respiratory effort.  No retractions. Lungs CTAB. Gastrointestinal: Soft , nondistended, nontender to palpation. No CVA tenderness. Musculoskeletal:  No joint effusions. No signs of acute trauma. Scaling lesion to the distal tip of  his right third toe.  S/p amputations of the right first and second toes.  Mild soft tissue swelling to the right foot diffusely without erythema. Neurologic:  Normal speech and language. No gross focal neurologic deficits are appreciated.  Skin:  Skin is warm, dry and intact. No rash noted. Psychiatric: Mood and affect are normal. Speech and behavior are normal. ____________________________________________   LABS (all labs ordered are listed, but only abnormal results are displayed)  Labs Reviewed  CBC WITH DIFFERENTIAL/PLATELET - Abnormal; Notable for the following components:      Result Value   WBC 2.8 (*)    RBC 3.08 (*)    Hemoglobin 9.7 (*)    HCT 28.6 (*)    Platelets 103 (*)    All other components within normal limits  BASIC METABOLIC PANEL - Abnormal; Notable for the following components:   Glucose, Bld 228 (*)    BUN 32 (*)    Calcium 8.7 (*)    GFR, Estimated 60 (*)    All other components within normal limits  SEDIMENTATION RATE - Abnormal; Notable for the following components:   Sed Rate 54 (*)    All other components within normal limits  CULTURE, BLOOD (SINGLE)  RESP PANEL BY RT-PCR (FLU A&B, COVID) ARPGX2  LACTIC ACID, PLASMA  LACTIC ACID, PLASMA   ____________________________________________  12 Lead EKG   ____________________________________________  RADIOLOGY  ED MD interpretation: Plain film of the right foot reviewed by me with extensive lytic lesions to the right third toe consistent with osteomyelitis.  Official radiology report(s): DG Foot Complete Right  Result Date: 07/25/2021 CLINICAL DATA:  Previous amputation.  Swelling of the third toe. EXAM: RIGHT FOOT COMPLETE - 3+ VIEW COMPARISON:  06/01/2021 FINDINGS: Previous amputation of the great toe and second toe at the MTP joint level. Patient now shows lytic destruction of the distal phalanx of the third toe consistent with osteomyelitis. Evidence of bone infection elsewhere. Ordinary  degenerative changes present in the midfoot. Ordinary calcaneal spurs. Arterial calcification is noted. IMPRESSION: Previous amputation of the great toe and second toe at the MTP joints. Newly seen lytic destruction of the distal phalanx the third toe consistent with osteomyelitis. Electronically Signed   By: Nelson Chimes M.D.   On: 07/25/2021 14:12    ____________________________________________   PROCEDURES and INTERVENTIONS  Procedure(s) performed (including Critical Care):  Procedures  Medications  vancomycin (VANCOREADY) IVPB 1250 mg/250 mL (has no administration in  time range)  cefTRIAXone (ROCEPHIN) 1 g in sodium chloride 0.9 % 100 mL IVPB (has no administration in time range)  acetaminophen (TYLENOL) tablet 1,000 mg (1,000 mg Oral Given 07/25/21 1335)    ____________________________________________   MDM / ED COURSE   80 year old male with history of PAD presents to the ED with evidence of osteomyelitis, without evidence of sepsis or systemic illness, requiring antibiotics and medical admission.  Exam with a scaling lesion isolated to his right third toe, but no evidence of systemic illness.  No evidence of neurologic or vascular deficits.  Blood work with elevation of his ESR, but no lactic acidosis or stigmata of sepsis.  CKD at baseline.  Plain film imaging with evidence of osteomyelitis.  We will start IV antibiotics and admit to medicine.  Clinical Course as of 07/25/21 1424  Fri Jul 25, 2021  1421 Educated patient of signs of osteomyelitis and my recommendation for admission for amputation.  He is in agreement. [DS]    Clinical Course User Index [DS] Vladimir Crofts, MD    ____________________________________________   FINAL CLINICAL IMPRESSION(S) / ED DIAGNOSES  Final diagnoses:  Other acute osteomyelitis of right foot ([place])  Toe osteomyelitis, right [facility])     ED Discharge Orders     None            Note:  This document was prepared using  Dragon voice recognition software and may include unintentional dictation errors.    Vladimir Crofts, MD 07/25/21 1426

## 2021-07-25 NOTE — Consult Note (Signed)
ORTHOPAEDIC CONSULTATION  REQUESTING PHYSICIAN: Cox, Amy N, DO  Chief Complaint: Pain and swelling right foot  HPI: Nathan Singleton is a 80 y.o. male who complains of worsening pain and swelling to his right foot.  Well-known history of diabetes.  Is status post great toe and second amputation by myself in the past.  He noticed swelling and soreness to this forefoot area.  Despite his neuropathy it was uncomfortable.  He seen some swelling in the area.  Came to the ER for further work-up and evaluation.  Past Medical History:  Diagnosis Date   Aneurysm (arteriovenous) of coronary vessels    Arthritis    Ascending aortic aneurysm (HCC)    CAD (coronary artery disease)    Diabetes mellitus without complication ([place])    Hard of hearing    History of GI bleed    Hyperlipidemia associated with type 2 diabetes mellitus ([place])    Hypertension associated with diabetes ([place])    Presence of permanent cardiac pacemaker    PVD (peripheral vascular disease) ([place])    S/P TAVR (transcatheter aortic valve replacement)    Past Surgical History:  Procedure Laterality Date   AMPUTATION TOE Right 03/20/2021   Procedure: AMPUTATION TOE;  Surgeon: Sharlotte Alamo, DPM;  Location: [facility];  Service: Podiatry;  Laterality: Right;   AMPUTATION TOE Right 06/03/2021   Procedure: AMPUTATION TOE-SECOND TOE;  Surgeon: [name], DPM;  Location: [facility];  Service: Podiatry;  Laterality: Right;   Ankle repaired Right    Carpal Tunnel repaired Left    CATARACT EXTRACTION Bilateral    COLONOSCOPY     Copillar implant      DG CHOLECYSTOGRAPHY GALL BLADDER ([facility] HX)     GASTRIC BYPASS     heart valve replaced     IRRIGATION AND DEBRIDEMENT FOOT Right 02/18/2021   Procedure: IRRIGATION AND DEBRIDEMENT FOOT-Right Great Toe;  Surgeon: [name], DPM;  Location: [facility];  Service: Podiatry;  Laterality: Right;   LOWER EXTREMITY ANGIOGRAPHY Right 02/17/2021   Procedure: Lower Extremity Angiography;  Surgeon: Algernon Huxley, MD;  Location: [facility];  Service: Cardiovascular;  Laterality: Right;   LOWER EXTREMITY ANGIOGRAPHY Right 03/27/2021   Procedure: Lower Extremity Angiography;  Surgeon: Algernon Huxley, MD;  Location: [facility];  Service: Cardiovascular;  Laterality: Right;   PACEMAKER GENERATOR CHANGE     REPLACEMENT TOTAL KNEE BILATERAL     RTC     STENT PLACE LEFT URETER ([place] HX) Right    Right Leg   TEE WITHOUT CARDIOVERSION N/A 03/26/2021   Procedure: TRANSESOPHAGEAL ECHOCARDIOGRAM (TEE);  Surgeon: Kate Sable, MD;  Location: [facility];  Service: Cardiovascular;  Laterality: N/A;   TEE WITHOUT CARDIOVERSION N/A 06/04/2021   Procedure: TRANSESOPHAGEAL ECHOCARDIOGRAM (TEE);  Surgeon: Kate Sable, MD;  Location: [facility];  Service: Cardiovascular;  Laterality: N/A;   Toe nail removed Bilateral    Social History   Socioeconomic History   Marital status: Single    Spouse name: Not on file   Number of children: Not on file   Years of education: Not on file   Highest education level: Not on file  Occupational History   Not on file  Tobacco Use   Smoking status: Former   Smokeless tobacco: Never  Substance and Sexual Activity   Alcohol use: Not Currently   Drug use: Not Currently   Sexual activity: Not on file  Other Topics Concern   Not on file  Social History Narrative  Not on file   Social Determinants of Health   Financial Resource Strain: Not on file  Food Insecurity: Not on file  Transportation Needs: Not on file  Physical Activity: Not on file  Stress: Not on file  Social Connections: Not on file   Family History  Problem Relation Age of Onset   Heart disease Mother    Alzheimer's disease Mother    Heart disease Father    Diabetes Father    Allergies  Allergen Reactions   Atorvastatin Other (See Comments)    "bad for kidneys" Cramping    Celecoxib Other (See Comments)    Other reaction(s): Other (See Comments) Kidney  Problem Kidney Problem    Dicyclomine Hcl Other (See Comments)    Stomach cramps   Doxycycline Other (See Comments)    Per wife (per RN)- pt gets severe stomach pains even when takes with food and PCP said to not take     Metformin Other (See Comments)   Prior to Admission medications   Medication Sig Start Date End Date Taking? Authorizing Provider  acetaminophen (TYLENOL) 325 MG tablet Take 2 tablets (650 mg total) by mouth every 6 (six) hours as needed for fever, headache or moderate pain. 05/09/21   Loletha Grayer, MD  albuterol (VENTOLIN HFA) 108 (90 Base) MCG/ACT inhaler Inhale 2 puffs into the lungs every 4 (four) hours as needed for wheezing or shortness of breath. 05/09/21   Loletha Grayer, MD  allopurinol (ZYLOPRIM) 100 MG tablet Take 100 mg by mouth daily. 10/19/20   [provider]  amLODipine (NORVASC) 10 MG tablet Take 1 tablet (10 mg total) by mouth daily. 05/28/21 06/27/21  Nolberto Hanlon, MD  amoxicillin-clavulanate (AUGMENTIN) 875-125 MG tablet Take 1 tablet by mouth 2 (two) times daily. 07/17/21   Tsosie Billing, MD  BAYER ASPIRIN EC LOW DOSE 81 MG EC tablet Take 81 mg by mouth daily. 11/11/20   [provider]  carvedilol (COREG) 3.125 MG tablet Take 3.125 mg by mouth 2 (two) times daily. 05/22/21   [provider]  Cholecalciferol (VITAMIN D3) 125 MCG (5000 UT) CAPS Take 5,000 Units by mouth daily. 05/09/21   [provider]  clopidogrel (PLAVIX) 75 MG tablet Take 1 tablet (75 mg total) by mouth daily. 02/22/21   Charlynne Cousins, MD  gabapentin (NEURONTIN) 100 MG capsule Take 100 mg by mouth daily. 01/29/21   [provider]  Insulin Glargine-yfgn 100 UNIT/ML SOPN Inject 18 Units into the skin at bedtime. 05/09/21   [provider]  isosorbide mononitrate (IMDUR) 30 MG 24 hr tablet Take 30 mg by mouth daily. 02/12/21   [provider]  loperamide (IMODIUM) 2 MG capsule Take 1 capsule (2 mg total) by mouth as  needed for diarrhea or loose stools. 03/31/21   Blake Divine, MD  multivitamin (ONE-A-DAY MEN'S) TABS tablet Take 1 tablet by mouth daily. 11/11/20   [provider]  pantoprazole (PROTONIX) 40 MG tablet Take 1 tablet (40 mg total) by mouth daily. 02/11/21   [name], MD  rosuvastatin (CRESTOR) 40 MG tablet Take 40 mg by mouth daily. 02/13/21   [provider]  sertraline (ZOLOFT) 100 MG tablet Take 200 mg by mouth daily. 11/11/20   [provider]   DG Foot Complete Right  Result Date: 07/25/2021 CLINICAL DATA:  Previous amputation.  Swelling of the third toe. EXAM: RIGHT FOOT COMPLETE - 3+ VIEW COMPARISON:  06/01/2021 FINDINGS: Previous amputation of the great toe and second toe at  the MTP joint level. Patient now shows lytic destruction of the distal phalanx of the third toe consistent with osteomyelitis. Evidence of bone infection elsewhere. Ordinary degenerative changes present in the midfoot. Ordinary calcaneal spurs. Arterial calcification is noted. IMPRESSION: Previous amputation of the great toe and second toe at the MTP joints. Newly seen lytic destruction of the distal phalanx the third toe consistent with osteomyelitis. Electronically Signed   By: Nelson Chimes M.D.   On: 07/25/2021 14:12    Positive ROS: All other systems have been reviewed and were otherwise negative with the exception of those mentioned in the HPI and as above.  12 point ROS was performed.  Physical Exam: General: Alert and oriented.  No apparent distress.  Vascular:  Left foot:Dorsalis Pedis:  present Posterior Tibial:  present  Right foot: Dorsalis Pedis:  present Posterior Tibial:  present  Neuro:absent protective sensation  Derm: On the distal aspect of the right third toe is a noted area of superficial necrotic hyperkeratotic tissue.  Ortho/MS: Status post right great toe and second toe amputation.  Noted hammertoe contracture of the right third toe and remaining lesser toes.   Diffuse edema to the right foot as well.  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  I recommended amputation of this third toe.  I discussed the risk benefits alternatives and complications associated with surgery and consent has been given.  We had a discussion regards to continued amputation of the remaining toes but at this time I will defer given the increased risk of further wound complications.  He understands this is always a possibility in the future if he develops further complications to this area.  Orders have been placed.  We will plan on surgery tomorrow morning.    Elesa Hacker, DPM Cell [phone]   07/25/2021 3:32 PM

## 2021-07-25 NOTE — H&P (Signed)
History and Physical   Nathan Singleton LAG:[id number] DOB: May 02, 1941 DOA: 07/25/2021  PCP: Nathan Plumber, PA  Outpatient Specialists: Dr. Vickki Singleton, podiatry Patient coming from: Home via EMS  I have personally briefly reviewed patient's old medical records in [place].  Chief Concern: Right third toe pain and swelling  HPI: Nathan Singleton is a 80 y.o. male with medical history significant for insulin-dependent diabetes mellitus, neuropathy, history of TAVR, CAD, PAD, hyperlipidemia, hypertension, GERD, history of MSSA bacteremia, depression, anxiety, presents to the emergency department for chief concerns of right third toe pain and swelling.  He reports the pain is dull and worse with movements.  He states he only noticed the pain today.  He also endorses swelling.    At bedside he is able to tell me his name, his age, and location of hospital.  He is hard of hearing.  On physical exam the right foot at the 3rd toe is warmer to the touch when compared to the left foot. He denies fever, chills, cough, nausea, vomtiing, chest pain, shortness of breath.   Social history: He is a former tobacco user and quit smoking in the 1970s, at his peak he smoked 2-3 ppd. He denies etoh, recreational drug use. 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  ROS: Constitutional: no weight change, no fever ENT/Mouth: no sore throat, no rhinorrhea Eyes: no eye pain, no vision changes Cardiovascular: no chest pain, no dyspnea,  no edema, no palpitations Respiratory: no cough, no sputum, no wheezing Gastrointestinal: no nausea, no vomiting, no diarrhea, no constipation Genitourinary: no urinary incontinence, no dysuria, no hematuria Musculoskeletal: no arthralgias, no myalgias Skin: no skin lesions, no pruritus, Neuro: + weakness, no loss of consciousness, no syncope Psych: no anxiety, no depression, + decrease appetite Heme/Lymph: no bruising,  no bleeding  ED Course: Discussed with emergency medicine provider patient requiring hospitalization for osteomyelitis of the right third toe.  Vitals in the emergency department was remarkable for temperature 98.4, respiration rate of 18, heart rate 73, initial blood pressure 155/60 and increased to 167/72, SPO2 100% on room air.  Labs in the emergency department was remarkable for WBC 2.9, hemoglobin 9.7, platelets 103, sodium 138, potassium 4.6, chloride 107, bicarb 23, BUN 32, serum creatinine of 1.32, nonfasting blood glucose 228, EGFR 60.  Lactic acid was 1.1.  Sed rate was 54.  ED provider ordered 1 blood culture.  ED provider ordered ceftriaxone and vancomycin.  Tylenol 1000 mg once.  Assessment/Plan  Principal Problem:   Osteomyelitis [facility]) Active Problems:   Hypertension   Coronary artery disease involving coronary bypass graft of native heart with angina pectoris (HCC)   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   CAD (coronary artery disease)   Cellulitis   Chronic kidney disease (CKD)   COPD (chronic obstructive pulmonary disease) (HCC)   Depression   Heart block   Hyperlipidemia   Diabetic foot ulcer (HCC)   Chronic anticoagulation (Plavix)   Anemia of chronic disease   Bacteremia   # Osteomyelitis of the right third toe - Check MRSA - Continue vancomycin, add cefepime - We will consult podiatry, Dr. Vickki Singleton states patient will go to the OR on 07/26/21 - Hold Plavix at this time  # PAD-on dual antiplatelet with Plavix and aspirin - Holding Plavix and aspirin at this time  # Hypertension-patient takes amlodipine 10 mg daily, Coreg 3.125 mg twice  daily, isosorbide mononitrate 30 mg daily - Hydralazine 10 mg p.o. every 6 hours as needed for SBP greater than 170, 2 days ordered  # Hyperlipidemia-rosuvastatin 40 mg nightly resumed  # Depression/anxiety-sertraline 200 mg daily  # Insulin-dependent diabetes mellitus-long-acting insulin  glargine 18 units nightly resumed - Insulin SSI with at bedtime coverage ordered  # History of gout-in remission, allopurinol 100 mg daily resumed # Neuropathy-gabapentin 100 mg daily resumed  # History of COPD-currently appears compensated at this time, resumed home albuterol nebulizer every 4 hours as needed for wheezing and shortness of breath  # GERD-PPI  # Unintentional 20 pound weight lost- advised patient and fiancee, Nathan Singleton that patient needs to follow-up with pcp for carcinoma work up  Chart reviewed.   DVT prophylaxis: Heparin 5000 units subcutaneous Code Status: Full code Diet: Heart healthy/carb modified Family Communication: Nathan  Disposition Plan: Pending clinical course Consults called: Podiatry Admission status: MedSurg, inpatient, telemetry  Past Medical History:  Diagnosis Date   Aneurysm (arteriovenous) of coronary vessels    Arthritis    Ascending aortic aneurysm (HCC)    CAD (coronary artery disease)    Diabetes mellitus without complication (HCC)    Hard of hearing    History of GI bleed    Hyperlipidemia associated with type 2 diabetes mellitus (HCC)    Hypertension associated with diabetes (HCC)    Presence of permanent cardiac pacemaker    PVD (peripheral vascular disease) (HCC)    S/P TAVR (transcatheter aortic valve replacement)    Past Surgical History:  Procedure Laterality Date   AMPUTATION TOE Right 03/20/2021   Procedure: AMPUTATION TOE;  Surgeon: Singleton, Nathan, DPM;  Location: [facility];  Service: Podiatry;  Laterality: Right;   AMPUTATION TOE Right 06/03/2021   Procedure: AMPUTATION TOE-SECOND TOE;  Surgeon: Singleton, Nathan, DPM;  Location: [facility];  Service: Podiatry;  Laterality: Right;   Ankle repaired Right    Carpal Tunnel repaired Left    CATARACT EXTRACTION Bilateral    COLONOSCOPY     Copillar implant      DG CHOLECYSTOGRAPHY GALL BLADDER ([facility])     GASTRIC BYPASS     heart valve replaced     IRRIGATION AND  DEBRIDEMENT FOOT Right 02/18/2021   Procedure: IRRIGATION AND DEBRIDEMENT FOOT-Right Great Toe;  Surgeon: Singleton, Nathan, DPM;  Location: [facility];  Service: Podiatry;  Laterality: Right;   LOWER EXTREMITY ANGIOGRAPHY Right 02/17/2021   Procedure: Lower Extremity Angiography;  Surgeon: Dew, Jason S, MD;  Location: [facility];  Service: Cardiovascular;  Laterality: Right;   LOWER EXTREMITY ANGIOGRAPHY Right 03/27/2021   Procedure: Lower Extremity Angiography;  Surgeon: Dew, Jason S, MD;  Location: [facility];  Service: Cardiovascular;  Laterality: Right;   PACEMAKER GENERATOR CHANGE     REPLACEMENT TOTAL KNEE BILATERAL     RTC     STENT PLACE LEFT URETER ([facility] HX) Right    Right Leg   TEE WITHOUT CARDIOVERSION N/A 03/26/2021   Procedure: TRANSESOPHAGEAL ECHOCARDIOGRAM (TEE);  Surgeon: Agbor-Etang, Brian, MD;  Location: [facility];  Service: Cardiovascular;  Laterality: N/A;   TEE WITHOUT CARDIOVERSION N/A 06/04/2021   Procedure: TRANSESOPHAGEAL ECHOCARDIOGRAM (TEE);  Surgeon: Agbor-Etang, Brian, MD;  Location: [facility];  Service: Cardiovascular;  Laterality: N/A;   Toe nail removed Bilateral    Social History:  reports that he has quit smoking. He has never used smokeless tobacco. He reports that he does not currently use alcohol. He reports that he does not   currently use drugs.  Allergies  Allergen Reactions   Atorvastatin Other (See Comments)    "bad for kidneys" Cramping    Celecoxib Other (See Comments)    Other reaction(s): Other (See Comments) Kidney Problem Kidney Problem    Dicyclomine Hcl Other (See Comments)    Stomach cramps   Doxycycline Other (See Comments)    Per wife (per RN)- pt gets severe stomach pains even when takes with food and PCP said to not take     Metformin Other (See Comments)   Family History  Problem Relation Age of Onset   Heart disease Mother    Alzheimer's disease Mother    Heart disease Father    Diabetes Father    Family  history: Family history reviewed and not pertinent  Prior to Admission medications   Medication Sig Start Date End Date Taking? Authorizing Provider  acetaminophen (TYLENOL) 325 MG tablet Take 2 tablets (650 mg total) by mouth every 6 (six) hours as needed for fever, headache or moderate pain. 05/09/21   Wieting, Richard, MD  albuterol (VENTOLIN HFA) 108 (90 Base) MCG/ACT inhaler Inhale 2 puffs into the lungs every 4 (four) hours as needed for wheezing or shortness of breath. 05/09/21   Wieting, Richard, MD  allopurinol (ZYLOPRIM) 100 MG tablet Take 100 mg by mouth daily. 10/19/20   [provider]  amLODipine (NORVASC) 10 MG tablet Take 1 tablet (10 mg total) by mouth daily. 05/28/21 06/27/21  Amery, Sahar, MD  amoxicillin-clavulanate (AUGMENTIN) 875-125 MG tablet Take 1 tablet by mouth 2 (two) times daily. 07/17/21   Ravishankar, Jayashree, MD  BAYER ASPIRIN EC LOW DOSE 81 MG EC tablet Take 81 mg by mouth daily. 11/11/20   [provider]  carvedilol (COREG) 3.125 MG tablet Take 3.125 mg by mouth 2 (two) times daily. 05/22/21   [provider]  Cholecalciferol (VITAMIN D3) 125 MCG (5000 UT) CAPS Take 5,000 Units by mouth daily. 05/09/21   [provider]  clopidogrel (PLAVIX) 75 MG tablet Take 1 tablet (75 mg total) by mouth daily. 02/22/21   Feliz Ortiz, Abraham, MD  gabapentin (NEURONTIN) 100 MG capsule Take 100 mg by mouth daily. 01/29/21   [provider]  Insulin Glargine-yfgn 100 UNIT/ML SOPN Inject 18 Units into the skin at bedtime. 05/09/21   [provider]  isosorbide mononitrate (IMDUR) 30 MG 24 hr tablet Take 30 mg by mouth daily. 02/12/21   [provider]  loperamide (IMODIUM) 2 MG capsule Take 1 capsule (2 mg total) by mouth as needed for diarrhea or loose stools. 03/31/21   Jessup, Charles, MD  multivitamin (ONE-A-DAY MEN'S) TABS tablet Take 1 tablet by mouth daily. 11/11/20   [provider]  pantoprazole (PROTONIX) 40 MG  tablet Take 1 tablet (40 mg total) by mouth daily. 02/11/21   Anna, Kiran, MD  rosuvastatin (CRESTOR) 40 MG tablet Take 40 mg by mouth daily. 02/13/21   [provider]  sertraline (ZOLOFT) 100 MG tablet Take 200 mg by mouth daily. 11/11/20   [provider]   Physical Exam: Vitals:   07/25/21 1223 07/25/21 1226 07/25/21 1330 07/25/21 1430  BP: (!) 155/60  (!) 153/89 (!) 167/72  Pulse: 73  60 61  Resp: 18  20 18  Temp: 98.4 F (36.9 C)     TempSrc: Oral     SpO2: 100%  100% 100%  Weight:  103 kg    Height:  5' 8" (1.727 m)     Constitutional: appears   age-appropriate, frail, NAD, calm, comfortable Eyes: PERRL, lids and conjunctivae normal ENMT: Mucous membranes are moist. Posterior pharynx clear of any exudate or lesions. Age-appropriate dentition. Hearing appropriate Neck: normal, supple, no masses, no thyromegaly Respiratory: clear to auscultation bilaterally, no wheezing, no crackles. Normal respiratory effort. No accessory muscle use.  Cardiovascular: Regular rate and rhythm, no murmurs / rubs / gallops. No extremity edema. 2+ pedal pulses. No carotid bruits.  Abdomen: no tenderness, no masses palpated, no hepatosplenomegaly. Bowel sounds positive.  Musculoskeletal: no clubbing / cyanosis.  Deformity per picture.  Good ROM, no contractures, no atrophy. Normal muscle tone.    Skin: no rashes, lesions, ulcers. No induration. Dry skin with flakes Neurologic: Sensation intact. Strength 5/5 in all 4.  Psychiatric: Normal judgment and insight. Alert and oriented x 3. Normal mood.   EKG: Not indicated at this time  Right foot x-ray on Admission: I personally reviewed and I agree with radiologist reading as below.  DG Foot Complete Right  Result Date: 07/25/2021 CLINICAL DATA:  Previous amputation.  Swelling of the third toe. EXAM: RIGHT FOOT COMPLETE - 3+ VIEW COMPARISON:  06/01/2021 FINDINGS: Previous amputation of the great toe and second toe at the MTP joint level.  Patient now shows lytic destruction of the distal phalanx of the third toe consistent with osteomyelitis. Evidence of bone infection elsewhere. Ordinary degenerative changes present in the midfoot. Ordinary calcaneal spurs. Arterial calcification is noted. IMPRESSION: Previous amputation of the great toe and second toe at the MTP joints. Newly seen lytic destruction of the distal phalanx the third toe consistent with osteomyelitis. Electronically Signed   By: Nelson Chimes M.D.   On: 07/25/2021 14:12    Labs on Admission: I have personally reviewed following labs  CBC: Recent Labs  Lab 07/25/21 1328  WBC 2.8*  NEUTROABS 1.8  HGB 9.7*  HCT 28.6*  MCV 92.9  PLT 948*   Basic Metabolic Panel: Recent Labs  Lab 07/25/21 1328  NA 138  K 4.6  CL 107  CO2 23  GLUCOSE 228*  BUN 32*  CREATININE 1.23  CALCIUM 8.7*   GFR: Estimated Creatinine Clearance: 56.6 mL/min (by C-G formula based on SCr of 1.23 mg/dL).  Urine analysis:    Component Value Date/Time   COLORURINE YELLOW (A) 06/01/2021 1452   APPEARANCEUR CLEAR (A) 06/01/2021 1452   LABSPEC 1.010 06/01/2021 1452   PHURINE 5.0 06/01/2021 1452   GLUCOSEU 150 (A) 06/01/2021 1452   HGBUR NEGATIVE 06/01/2021 1452   BILIRUBINUR NEGATIVE 06/01/2021 1452   KETONESUR NEGATIVE 06/01/2021 1452   PROTEINUR 100 (A) 06/01/2021 1452   NITRITE NEGATIVE 06/01/2021 1452   LEUKOCYTESUR NEGATIVE 06/01/2021 1452   Dr. Tobie Poet [facility]  If 7PM-7AM, please contact overnight-coverage provider If 7AM-7PM, please contact day coverage provider [email or web address]  07/25/2021, 2:49 PM

## 2021-07-25 NOTE — Consult Note (Signed)
PHARMACY -  BRIEF ANTIBIOTIC NOTE   Pharmacy has received consult(s) for vancomycin from an ED provider.  The patient's profile has been reviewed for ht/wt/allergies/indication/available labs.    One time order(s) placed for vancomycin 2250 mg (ordered as 1000 mg followed by 1250 mg)   Further antibiotics/pharmacy consults should be ordered by admitting physician if indicated.                       Thank you, Darnelle Bos, PharmD 07/25/2021  2:26 PM

## 2021-07-25 NOTE — ED Triage Notes (Signed)
Pt reports that he had foot surgery in the past to have two toes removed. He is complaining of right middle toe pain. It has a necrotic are on the top of it. Dr. Vickki Muff told pt to come to be evaluated

## 2021-07-25 NOTE — ED Notes (Signed)
See triage note, pt c/o of middle toe pain on right foot, has had 2 toes amputated. Has appt with Dr Vickki Muff later Denies needs at this time

## 2021-07-26 ENCOUNTER — Inpatient Hospital Stay: Payer: 59 | Admitting: Pediatrics

## 2021-07-26 ENCOUNTER — Encounter: Payer: Self-pay | Admitting: Internal Medicine

## 2021-07-26 ENCOUNTER — Encounter
Admission: EM | Disposition: A | Discharge status: Home / Self Care | Payer: Self-pay | Source: Home / Self Care | Attending: Emergency Medicine

## 2021-07-26 DIAGNOSIS — M86171 Other acute osteomyelitis, right ankle and foot: Secondary | ICD-10-CM | POA: Diagnosis not present

## 2021-07-26 HISTORY — PX: AMPUTATION TOE: SHX6595

## 2021-07-26 LAB — BASIC METABOLIC PANEL
Anion gap: 8 (ref 5–15)
BUN: 29 mg/dL — ABNORMAL HIGH (ref 8–23)
CO2: 23 mmol/L (ref 22–32)
Calcium: 8.5 mg/dL — ABNORMAL LOW (ref 8.9–10.3)
Chloride: 107 mmol/L (ref 98–111)
Creatinine, Ser: 1.19 mg/dL (ref 0.61–1.24)
GFR, Estimated: >60 mL/min (ref >60)
Glucose, Bld: 88 mg/dL (ref 70–99)
Potassium: 4.3 mmol/L (ref 3.5–5.1)
Sodium: 138 mmol/L (ref 135–145)

## 2021-07-26 LAB — SURGICAL PCR SCREEN
MRSA, PCR: NEGATIVE
Staphylococcus aureus: NEGATIVE

## 2021-07-26 LAB — CBC
HCT: 28.1 % — ABNORMAL LOW (ref 39.0–52.0)
Hemoglobin: 9.4 g/dL — ABNORMAL LOW (ref 13.0–17.0)
MCH: 30.8 pg (ref 26.0–34.0)
MCHC: 33.5 g/dL (ref 30.0–36.0)
MCV: 92.1 fL (ref 80.0–100.0)
Platelets: 113 10*3/uL — ABNORMAL LOW (ref 150–400)
RBC: 3.05 MIL/uL — ABNORMAL LOW (ref 4.22–5.81)
RDW: 14.4 % (ref 11.5–15.5)
WBC: 3.5 10*3/uL — ABNORMAL LOW (ref 4.0–10.5)
nRBC: 0 % (ref 0.0–0.2)

## 2021-07-26 LAB — GLUCOSE, CAPILLARY
Glucose-Capillary: 87 mg/dL (ref 70–99)
Glucose-Capillary: 111 mg/dL — ABNORMAL HIGH (ref 70–99)

## 2021-07-26 LAB — MRSA NEXT GEN BY PCR, NASAL: MRSA by PCR Next Gen: NOT DETECTED

## 2021-07-26 SURGERY — AMPUTATION, TOE
Anesthesia: General | Site: Toe | Laterality: Right

## 2021-07-26 MED ORDER — AMOXICILLIN-POT CLAVULANATE 875-125 MG PO TABS: 1.0000 | ORAL_TABLET | Freq: Two times a day (BID) | ORAL | 0 refills | Status: AC

## 2021-07-26 MED ORDER — FENTANYL CITRATE (PF) 100 MCG/2ML IJ SOLN: 25.0000 ug | INTRAMUSCULAR | Status: DC | PRN

## 2021-07-26 MED ORDER — HYDROCODONE-ACETAMINOPHEN 5-325 MG PO TABS: 1.0000 | ORAL_TABLET | Freq: Four times a day (QID) | ORAL | 0 refills | Status: DC | PRN

## 2021-07-26 MED ORDER — OXYCODONE HCL 5 MG/5ML PO SOLN
5.0000 mg | Freq: Once | ORAL | Status: DC | PRN
Start: 2021-07-26 — End: 2021-07-26

## 2021-07-26 MED ORDER — PROMETHAZINE HCL 25 MG/ML IJ SOLN: 6.2500 mg | INTRAMUSCULAR | Status: DC | PRN

## 2021-07-26 MED ORDER — OXYCODONE HCL 5 MG PO TABS
5.0000 mg | ORAL_TABLET | Freq: Once | ORAL | Status: DC | PRN
Start: 2021-07-26 — End: 2021-07-26

## 2021-07-26 MED ORDER — PROPOFOL 500 MG/50ML IV EMUL
INTRAVENOUS | Status: DC | PRN
  Administered 2021-07-26: 50 ug/kg/min via INTRAVENOUS

## 2021-07-26 MED ORDER — OXYCODONE HCL 5 MG PO TABS
5.0000 mg | ORAL_TABLET | ORAL | Status: DC | PRN
  Administered 2021-07-26: 5 mg via ORAL
  Filled 2021-07-26: qty 1

## 2021-07-26 MED ORDER — 0.9 % SODIUM CHLORIDE (POUR BTL) OPTIME
TOPICAL | Status: DC | PRN
  Administered 2021-07-26: 500 mL

## 2021-07-26 MED ORDER — PROPOFOL 500 MG/50ML IV EMUL
INTRAVENOUS | Status: AC
  Filled 2021-07-26: qty 50

## 2021-07-26 MED ORDER — MEPERIDINE HCL 25 MG/ML IJ SOLN: 6.2500 mg | INTRAMUSCULAR | Status: DC | PRN

## 2021-07-26 MED ORDER — LIDOCAINE HCL (PF) 1 % IJ SOLN
INTRAMUSCULAR | Status: DC | PRN
  Administered 2021-07-26: 4 mL

## 2021-07-26 MED ORDER — SODIUM CHLORIDE 0.9 % IV SOLN: INTRAVENOUS | Status: DC | PRN

## 2021-07-26 MED ORDER — MUPIROCIN 2 % EX OINT
1.0000 "application " | TOPICAL_OINTMENT | Freq: Two times a day (BID) | CUTANEOUS | Status: DC
  Filled 2021-07-26: qty 22

## 2021-07-26 MED ORDER — BUPIVACAINE HCL 0.5 % IJ SOLN
INTRAMUSCULAR | Status: DC | PRN
  Administered 2021-07-26: 4 mL

## 2021-07-26 SURGICAL SUPPLY — 45 items
BLADE OSC/SAGITTAL MD 5.5X18 (BLADE) ×2 IMPLANT
BLADE SURG MINI STRL (BLADE) ×2 IMPLANT
BNDG CONFORM 2 STRL LF (GAUZE/BANDAGES/DRESSINGS) ×2 IMPLANT
BNDG CONFORM 3 STRL LF (GAUZE/BANDAGES/DRESSINGS) ×4 IMPLANT
BNDG ELASTIC 4X5.8 VLCR NS LF (GAUZE/BANDAGES/DRESSINGS) ×2 IMPLANT
BNDG ELASTIC 4X5.8 VLCR STR LF (GAUZE/BANDAGES/DRESSINGS) ×2 IMPLANT
BNDG ESMARK 4X12 TAN STRL LF (GAUZE/BANDAGES/DRESSINGS) ×2 IMPLANT
BNDG GAUZE ELAST 4 BULKY (GAUZE/BANDAGES/DRESSINGS) ×2 IMPLANT
BNDG STRETCH 4X75 STRL LF (GAUZE/BANDAGES/DRESSINGS) ×2 IMPLANT
CANISTER SUCT 1200ML W/VALVE (MISCELLANEOUS) ×2 IMPLANT
CUFF TOURN SGL QUICK 12 (TOURNIQUET CUFF) IMPLANT
CUFF TOURN SGL QUICK 18X4 (TOURNIQUET CUFF) IMPLANT
DRAPE FLUOR MINI C-ARM 54X84 (DRAPES) ×2 IMPLANT
DRAPE XRAY CASSETTE 23X24 (DRAPES) ×2 IMPLANT
DURAPREP 26ML APPLICATOR (WOUND CARE) ×2 IMPLANT
ELECT REM PT RETURN 9FT ADLT (ELECTROSURGICAL) ×2
ELECTRODE REM PT RTRN 9FT ADLT (ELECTROSURGICAL) ×1 IMPLANT
GAUZE 4X4 16PLY ~~LOC~~+RFID DBL (SPONGE) ×2 IMPLANT
GAUZE PACKING IODOFORM 1/2 (PACKING) ×2 IMPLANT
GAUZE SPONGE 4X4 12PLY STRL (GAUZE/BANDAGES/DRESSINGS) ×2 IMPLANT
GAUZE XEROFORM 1X8 LF (GAUZE/BANDAGES/DRESSINGS) ×2 IMPLANT
GLOVE SURG ENC MOIS LTX SZ7.5 (GLOVE) ×2 IMPLANT
GLOVE SURG UNDER LTX SZ8 (GLOVE) ×2 IMPLANT
GOWN STRL REUS W/ TWL XL LVL3 (GOWN DISPOSABLE) ×2 IMPLANT
GOWN STRL REUS W/TWL XL LVL3 (GOWN DISPOSABLE) ×2
IV NS IRRIG 3000ML ARTHROMATIC (IV SOLUTION) ×2 IMPLANT
KIT TURNOVER KIT A (KITS) ×2 IMPLANT
LABEL OR SOLS (LABEL) ×2 IMPLANT
MANIFOLD NEPTUNE II (INSTRUMENTS) ×2 IMPLANT
NEEDLE FILTER BLUNT 18X 1/2SAF (NEEDLE) ×1
NEEDLE FILTER BLUNT 18X1 1/2 (NEEDLE) ×1 IMPLANT
NEEDLE HYPO 25X1 1.5 SAFETY (NEEDLE) ×2 IMPLANT
NS IRRIG 500ML POUR BTL (IV SOLUTION) ×2 IMPLANT
PACK EXTREMITY ARMC (MISCELLANEOUS) ×2 IMPLANT
PAD ABD DERMACEA PRESS 5X9 (GAUZE/BANDAGES/DRESSINGS) ×4 IMPLANT
PULSAVAC PLUS IRRIG FAN TIP (DISPOSABLE) ×2
SHIELD FULL FACE ANTIFOG 7M (MISCELLANEOUS) ×2 IMPLANT
STOCKINETTE M/LG 89821 (MISCELLANEOUS) ×2 IMPLANT
STRAP SAFETY 5IN WIDE (MISCELLANEOUS) ×2 IMPLANT
SUT ETHILON 3-0 FS-10 30 BLK (SUTURE) ×2
SUT ETHILON 5-0 FS-2 18 BLK (SUTURE) ×2 IMPLANT
SUT VIC AB 4-0 FS2 27 (SUTURE) ×2 IMPLANT
SUTURE EHLN 3-0 FS-10 30 BLK (SUTURE) ×1 IMPLANT
SYR 10ML LL (SYRINGE) ×6 IMPLANT
TIP FAN IRRIG PULSAVAC PLUS (DISPOSABLE) ×1 IMPLANT

## 2021-07-26 NOTE — Op Note (Addendum)
Operative note   Surgeon:Haskel Dewalt Lawyer: None    Preop diagnosis: Osteomyelitis right third toe    Postop diagnosis: Same    Procedure: Amputation right third toe    EBL: Minimal    Anesthesia:local and IV sedation.  Local consisted of a total of 4 cc of 1% lidocaine plain and 4 cc of 0.5% bupivacaine plain    Hemostasis: None    Specimen: Distal bone for culture and second toe for pathology    Complications: None    Operative indications:Nathan Singleton is an 80 y.o. that presents today for surgical intervention.  The risks/benefits/alternatives/complications have been discussed and consent has been given.    Procedure:  Patient was brought into the OR and placed on the operating table in thesupine position. After anesthesia was obtained theright lower extremity was prepped and draped in usual sterile fashion.  Attention was directed to the right foot at the level of the base of the third toe due to full-thickness incisions were carried down to the level of bone.  With a power saw at the base of the proximal phalanx just distal to the metatarsophalangeal joint osteotomy was created.  The toe was then removed from the surgical field in toto.  Bleeders were Bovie cauterized.  A sample of the distal phalanx and soft tissue was sent for culture.  The remainder of the toe was sent for pathological examination.  The wound was flushed with copious amounts of irrigation.  Closure was then performed with a 4-0 Vicryl and a 3-0 nylon.  A bulky sterile dressing was applied.    Patient tolerated the procedure and anesthesia well.  Was transported from the OR to the PACU with all vital signs stable and vascular status intact. To be discharged per routine protocol.  Will follow up in approximately 1 week in the outpatient clinic.

## 2021-07-26 NOTE — Transfer of Care (Signed)
Immediate Anesthesia Transfer of Care Note  Patient: Nathan Singleton  Procedure(s) Performed: AMPUTATION TOE-THIRD TOE (Right: Toe)  Patient Location: PACU  Anesthesia Type:MAC  Level of Consciousness: awake, alert  and oriented  Airway & Oxygen Therapy: Patient Spontanous Breathing and Patient connected to nasal cannula oxygen  Post-op Assessment: Report given to RN and Post -op Vital signs reviewed and stable  Post vital signs: Reviewed and stable  Last Vitals:  Vitals Value Taken Time  BP 106/89   Temp 99.92f  Pulse 60 07/26/21 0852  Resp 10 07/26/21 0852  SpO2 100 % 07/26/21 0852  Vitals shown include unvalidated device data.  Last Pain:  Vitals:   07/26/21 0744  TempSrc:   PainSc: Asleep      Patients Stated Pain Goal: 2 ([id number]  Complications: No notable events documented.

## 2021-07-26 NOTE — Anesthesia Postprocedure Evaluation (Signed)
Anesthesia Post Note  Patient: Nathan Singleton  Procedure(s) Performed: AMPUTATION TOE-THIRD TOE (Right: Toe)  Patient location during evaluation: PACU Anesthesia Type: General Level of consciousness: awake and alert Pain management: pain level controlled Vital Signs Assessment: post-procedure vital signs reviewed and stable Respiratory status: spontaneous breathing, nonlabored ventilation, respiratory function stable and patient connected to nasal cannula oxygen Cardiovascular status: stable and blood pressure returned to baseline Postop Assessment: no apparent nausea or vomiting Anesthetic complications: no   No notable events documented.   Last Vitals:  Vitals:   07/26/21 0853 07/26/21 0900  BP: 106/89 (!) 164/65  Pulse: 60 (!) 59  Resp: 10 18  Temp: 37.3 C   SpO2: 100% 99%    Last Pain:  Vitals:   07/26/21 0853  TempSrc:   PainSc: Asleep                 Larey Dresser

## 2021-07-26 NOTE — Progress Notes (Signed)
Pt and wife provided discharge instructions, all belongings sent with pt. Post op shoe is on and pt discharged in wheelchair by this RN. All questions and concerns addressed. VSS.    07/26/21 1204  Vitals  Temp 98.4 F (36.9 C)  BP (!) 149/67  MAP (mmHg) 91  BP Location Left Arm  BP Method Automatic  Patient Position (if appropriate) Lying  Pulse Rate 94  Pulse Rate Source Monitor  Resp 16

## 2021-07-26 NOTE — Discharge Summary (Signed)
Physician Discharge Summary  Nathan Singleton [id number] DOB: [birth year] DOA: 07/25/2021  PCP: Ranae Plumber, PA  Admit date: 07/25/2021 Discharge date: 07/26/2021  Admitted From: Home Disposition:  Home with home health  Recommendations for Outpatient Follow-up:   Follow up with PCP in 1-2 weeks Follow-up with podiatry next week  Home Health: Yes, home health PT Equipment/Devices: Surgical shoe right foot  Discharge Condition: Stable CODE STATUS: Full Diet recommendation: Heart Healthy  Brief/Interim Summary: 80 y.o. male with medical history significant for insulin-dependent diabetes mellitus, neuropathy, history of TAVR, CAD, PAD, hyperlipidemia, hypertension, GERD, history of MSSA bacteremia, depression, anxiety, presents to the emergency department for chief concerns of right third toe pain and swelling.   He reports the pain is dull and worse with movements.  He states he only noticed the pain today.  He also endorses swelling.  Clinical presentation consistent with osteomyelitis of the third right toe.  Patient was taken for a amputation of affected extremity on 8/13.  Tolerated procedure well.  Postoperatively cleared for discharge by both medicine and podiatry.  Prescription for antibiotics and pain control sent via podiatry consultant to outpatient pharmacy.  Surgical shoe in place.  Patient will not need dressing changes at home.  He will follow-up with podiatry office next week for wound check and dressing change.  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  Tolerated procedure well.  Stable postoperatively.  Pain well controlled.  Stable for discharge at this time.  Can resume antiplatelet agents.  Home health PT ordered.  No need for home dressing changes.  Patient will follow-up in podiatry office next week for wound check and dressing changes.  Prescription for Augmentin and Norco sent to outpatient pharmacy.  Remainder of home medications unchanged  Discharge Instructions  Discharge Instructions     Diet - low sodium heart healthy   Complete by: As directed    Increase activity slowly   Complete by: As directed    No wound care   Complete by: As directed       Allergies as of 07/26/2021       Reactions   Atorvastatin Other (See Comments)   "bad for kidneys" Cramping   Celecoxib Other (See Comments)   Other reaction(s): Other (See Comments) Kidney Problem Kidney Problem   Dicyclomine Hcl Other (See Comments)   Stomach cramps   Doxycycline Other (See Comments)   Per wife (per RN)- pt gets severe stomach pains even when takes with food and PCP said to not take   Metformin Other (See Comments)        Medication List     TAKE these medications    acetaminophen 325 MG tablet Commonly known as: TYLENOL Take 2 tablets (650 mg total) by mouth every 6 (six) hours as needed for fever, headache or moderate pain.   albuterol 108 (90 Base) MCG/ACT inhaler Commonly known as: VENTOLIN HFA Inhale 2 puffs  into the lungs every 4 (four) hours as needed for wheezing or shortness of breath.   allopurinol 100 MG tablet Commonly known as: ZYLOPRIM Take 100 mg by mouth daily.   amLODipine 10 MG tablet Commonly known as: NORVASC Take 1 tablet (10 mg total) by mouth daily.   amoxicillin-clavulanate 875-125 MG tablet Commonly known as: Augmentin Take 1 tablet by mouth 2 (two) times  daily for 5 days.   Bayer Aspirin EC Low Dose 81 MG EC tablet Generic drug: aspirin Take 81 mg by mouth daily.   Calcium Citrate+D3 Petites 200-250 MG-UNIT Tabs Generic drug: Calcium Citrate-Vitamin D Take by mouth.   carvedilol 3.125 MG tablet Commonly known as: COREG Take 3.125 mg by mouth 2 (two) times daily.   carvedilol 6.25 MG tablet Commonly known as: COREG Take 6.25 mg by mouth 2 (two) times daily.   clopidogrel 75 MG tablet Commonly known as: PLAVIX Take 1 tablet (75 mg total) by mouth daily.   gabapentin 100 MG capsule Commonly known as: NEURONTIN Take 100 mg by mouth daily.   HYDROcodone-acetaminophen 5-325 MG tablet Commonly known as: Norco Take 1-2 tablets by mouth every 6 (six) hours as needed for moderate pain. Max 6 tabs per day   insulin glargine-yfgn 100 UNIT/ML Pen Commonly known as: SEMGLEE Inject 18 Units into the skin at bedtime.   isosorbide mononitrate 30 MG 24 hr tablet Commonly known as: IMDUR Take 30 mg by mouth daily.   loperamide 2 MG capsule Commonly known as: IMODIUM Take 1 capsule (2 mg total) by mouth as needed for diarrhea or loose stools.   multivitamin Tabs tablet Take 1 tablet by mouth daily.   pantoprazole 40 MG tablet Commonly known as: PROTONIX Take 1 tablet (40 mg total) by mouth daily.   rosuvastatin 40 MG tablet Commonly known as: CRESTOR Take 40 mg by mouth daily.   sertraline 100 MG tablet Commonly known as: ZOLOFT Take 200 mg by mouth daily.   Vitamin D3 125 MCG (5000 UT) Caps Take 5,000 Units by mouth daily.        Follow-up Information     Ranae Plumber, [place]. Schedule an appointment as soon as possible for a visit in 1 week(s).   Specialty: Family Medicine Contact information: [place] [address] [phone]         [name], DPM Follow up.   Specialty: Podiatry Why: Follow up as directed by Dr. Vickki Muff.  He will change your wound dressing during your office  visit Contact information: [place] [address] [phone]                Allergies  Allergen Reactions   Atorvastatin Other (See Comments)    "bad for kidneys" Cramping    Celecoxib Other (See Comments)    Other reaction(s): Other (See Comments) Kidney Problem Kidney Problem    Dicyclomine Hcl Other (See Comments)    Stomach cramps   Doxycycline Other (See Comments)    Per wife (per RN)- pt gets severe stomach pains even when takes with food and PCP said to not take     Metformin Other (See Comments)    Consultations: Podiatry   Procedures/Studies: DG Foot Complete Right  Result Date: 07/25/2021 CLINICAL DATA:  Previous amputation.  Swelling of the third toe. EXAM: RIGHT FOOT COMPLETE - 3+ VIEW COMPARISON:  06/01/2021 FINDINGS: Previous amputation of the great toe and second toe at the MTP joint level. Patient now shows lytic destruction  of the distal phalanx of the third toe consistent with osteomyelitis. Evidence of bone infection elsewhere. Ordinary degenerative changes present in the midfoot. Ordinary calcaneal spurs. Arterial calcification is noted. IMPRESSION: Previous amputation of the great toe and second toe at the MTP joints. Newly seen lytic destruction of the distal phalanx the third toe consistent with osteomyelitis. Electronically Signed   By: Nelson Chimes M.D.   On: 07/25/2021 14:12   ABI WITH/WO TBI  Result Date: 07/08/2021  LOWER EXTREMITY DOPPLER STUDY Patient Name:  LORIN SCHLIE  Date of Exam:   07/02/2021 Medical Rec #: [id number]       Accession #:    [id number] Date of Birth: 05/22/1941       Patient Gender: M Patient Age:   53Y Exam Location:  [facility] Procedure:      VAS [place] ABI WITH/WO TBI Referring Phys: [id number] [place] --------------------------------------------------------------------------------  Indications: Peripheral artery disease.  Vascular Interventions: 02/17/21 Right PTA of SFA, DP, and ATA.  Comparison Study: 03/19/2021 Performing Technologist: Concha Norway RVT  Examination Guidelines: A complete evaluation includes at minimum, Doppler waveform signals and systolic blood pressure reading at the level of bilateral brachial, anterior tibial, and posterior tibial arteries, when vessel segments are accessible. Bilateral testing is considered an integral part of a complete examination. Photoelectric Plethysmograph (PPG) waveforms and toe systolic pressure readings are included as required and additional duplex testing as needed. Limited examinations for reoccurring indications may be performed as noted.  ABI Findings: +---------+------------------+-----+--------+---------+ Right    Rt Pressure (mmHg)IndexWaveformComment   +---------+------------------+-----+--------+---------+ Brachial 189                                      +---------+------------------+-----+--------+---------+ ATA      195               1.03 biphasic          +---------+------------------+-----+--------+---------+ PTA      198               1.05 biphasic          +---------+------------------+-----+--------+---------+ Great Toe                               Amputated +---------+------------------+-----+--------+---------+ +---------+------------------+-----+--------+-------+ Left     Lt Pressure (mmHg)IndexWaveformComment +---------+------------------+-----+--------+-------+ Brachial 189                                    +---------+------------------+-----+--------+-------+ ATA      212               1.12 biphasic        +---------+------------------+-----+--------+-------+ PTA      208               1.10 biphasic        +---------+------------------+-----+--------+-------+ Great Toe166               0.88 Normal          +---------+------------------+-----+--------+-------+ +-------+-----------+-----------+------------+------------+ ABI/TBIToday's ABIToday's TBIPrevious  ABIPrevious TBI +-------+-----------+-----------+------------+------------+ Right  1.05       AMP        1.18        .92          +-------+-----------+-----------+------------+------------+ Left   1.12       .  88        1.26        .90          +-------+-----------+-----------+------------+------------+  Summary: Right: Resting right ankle-brachial index is within normal range. No evidence of significant right lower extremity arterial disease. Left: Resting left ankle-brachial index is within normal range. No evidence of significant left lower extremity arterial disease. The left toe-brachial index is normal.  *See table(s) above for measurements and observations.  Electronically signed by Leotis Pain MD on 07/08/2021 at 5:03:01 PM.    Final    (Echo, Carotid, EGD, Colonoscopy, ERCP)    Subjective: Seen and examined on the day of discharge.  Seen postoperatively.  Pain well controlled.  Hemodynamically stable.  Stable for discharge home.  Discharge Exam: Vitals:   07/26/21 0931 07/26/21 1204  BP: (!) 171/60 (!) 149/67  Pulse: (!) 59 94  Resp: 16 16  Temp: 98.3 F (36.8 C) 98.4 F (36.9 C)  SpO2: 99% 99%   Vitals:   07/26/21 0900 07/26/21 0915 07/26/21 0931 07/26/21 1204  BP: (!) 164/65 (!) 135/102 (!) 171/60 (!) 149/67  Pulse: (!) 59 (!) 59 (!) 59 94  Resp: 18 (!) '27 16 16  '$ Temp:  98.5 F (36.9 C) 98.3 F (36.8 C) 98.4 F (36.9 C)  TempSrc:      SpO2: 99% 100% 99% 99%  Weight:      Height:        General: Pt is alert, awake, not in acute distress Cardiovascular: RRR, S1/S2 +, no rubs, no gallops Respiratory: CTA bilaterally, no wheezing, no rhonchi Abdominal: Soft, NT, ND, bowel sounds + Extremities: Right foot wrapped, surgical shoe in place    The results of significant diagnostics from this hospitalization (including imaging, microbiology, ancillary and laboratory) are listed below for reference.     Microbiology: Recent Results (from the past 240 hour(s))   Blood culture (single)     Status: None (Preliminary result)   Collection Time: 07/25/21  1:17 PM   Specimen: BLOOD  Result Value Ref Range Status   Specimen Description BLOOD BLOOD LEFT FOREARM  Final   Special Requests   Final    BOTTLES DRAWN AEROBIC AND ANAEROBIC Blood Culture adequate volume   Culture   Final    NO GROWTH < 24 HOURS Performed at [facility], [address], [place], [place] [address]    Report Status PENDING  Incomplete  Resp Panel by RT-PCR (Flu A&B, Covid) Nasopharyngeal Swab     Status: None   Collection Time: 07/25/21  1:17 PM   Specimen: Nasopharyngeal Swab; Nasopharyngeal(NP) swabs in vial transport medium  Result Value Ref Range Status   SARS Coronavirus 2 by RT PCR NEGATIVE NEGATIVE Final    Comment: (NOTE) SARS-CoV-2 target nucleic acids are NOT DETECTED.  The SARS-CoV-2 RNA is generally detectable in upper respiratory specimens during the acute phase of infection. The lowest concentration of SARS-CoV-2 viral copies this assay can detect is 138 copies/mL. A negative result does not preclude SARS-Cov-2 infection and should not be used as the sole basis for treatment or other patient management decisions. A negative result may occur with  improper specimen collection/handling, submission of specimen other than nasopharyngeal swab, presence of viral mutation(s) within the areas targeted by this assay, and inadequate number of viral copies(<138 copies/mL). A negative result must be combined with clinical observations, patient history, and epidemiological information. The expected result is Negative.  Fact Sheet for Patients:  EntrepreneurPulse.com.au  Fact Sheet  for Healthcare Providers:  IncredibleEmployment.be  This test is no t yet approved or cleared by the [place] and  has been authorized for detection and/or diagnosis of SARS-CoV-2 by FDA under an Emergency Use Authorization (EUA). This  EUA will remain  in effect (meaning this test can be used) for the duration of the COVID-19 declaration under Section 564(b)(1) of the Act, 21 U.S.C.section 360bbb-3(b)(1), unless the authorization is terminated  or revoked sooner.       Influenza A by PCR NEGATIVE NEGATIVE Final   Influenza B by PCR NEGATIVE NEGATIVE Final    Comment: (NOTE) The Xpert Xpress SARS-CoV-2/FLU/RSV plus assay is intended as an aid in the diagnosis of influenza from Nasopharyngeal swab specimens and should not be used as a sole basis for treatment. Nasal washings and aspirates are unacceptable for Xpert Xpress SARS-CoV-2/FLU/RSV testing.  Fact Sheet for Patients: EntrepreneurPulse.com.au  Fact Sheet for Healthcare Providers: IncredibleEmployment.be  This test is not yet approved or cleared by the [place] FDA and has been authorized for detection and/or diagnosis of SARS-CoV-2 by FDA under an Emergency Use Authorization (EUA). This EUA will remain in effect (meaning this test can be used) for the duration of the COVID-19 declaration under Section 564(b)(1) of the Act, 21 U.S.C. section 360bbb-3(b)(1), unless the authorization is terminated or revoked.  Performed at [facility], [address] [place], [place] [address]   MRSA Next Gen by PCR, Nasal     Status: None   Collection Time: 07/26/21  4:40 AM   Specimen: Nasal Mucosa; Nasal Swab  Result Value Ref Range Status   MRSA by PCR Next Gen NOT DETECTED NOT DETECTED Final    Comment: (NOTE) The GeneXpert MRSA Assay (FDA approved for NASAL specimens only), is one component of a comprehensive MRSA colonization surveillance program. It is not intended to diagnose MRSA infection nor to guide or monitor treatment for MRSA infections. Test performance is not FDA approved in patients less than 40 years old. Performed at [facility], [address], [place], [place] [address]   Surgical  PCR screen     Status: None   Collection Time: 07/26/21  4:40 AM   Specimen: Nasal Mucosa; Nasal Swab  Result Value Ref Range Status   MRSA, PCR NEGATIVE NEGATIVE Final   Staphylococcus aureus NEGATIVE NEGATIVE Final    Comment: (NOTE) The Xpert SA Assay (FDA approved for NASAL specimens in patients 50 years of age and older), is one component of a comprehensive surveillance program. It is not intended to diagnose infection nor to guide or monitor treatment. Performed at [facility], [address] [place],  [address]      Labs: BNP (last 3 results) Recent Labs    03/20/21 0346 05/06/21 0150  BNP 155.6* [id number]*   Basic Metabolic Panel: Recent Labs  Lab 07/25/21 1328 07/26/21 0522  NA 138 138  K 4.6 4.3  CL 107 107  CO2 23 23  GLUCOSE 228* 88  BUN 32* 29*  CREATININE 1.23 1.19  CALCIUM 8.7* 8.5*   Liver Function Tests: No results for input(s): AST, ALT, ALKPHOS, BILITOT, PROT, ALBUMIN in the last 168 hours. No results for input(s): LIPASE, AMYLASE in the last 168 hours. No results for input(s): AMMONIA in the last 168 hours. CBC: Recent Labs  Lab 07/25/21 1328 07/26/21 0522  WBC 2.8* 3.5*  NEUTROABS 1.8  --   HGB 9.7* 9.4*  HCT 28.6* 28.1*  MCV 92.9 92.1  PLT 103*  113*   Cardiac Enzymes: No results for input(s): CKTOTAL, CKMB, CKMBINDEX, TROPONINI in the last 168 hours. BNP: Invalid input(s): POCBNP CBG: Recent Labs  Lab 07/25/21 1618 07/25/21 2119 07/26/21 0858 07/26/21 1215  GLUCAP 100* 191* 87 111*   D-Dimer No results for input(s): DDIMER in the last 72 hours. Hgb A1c Recent Labs    07/25/21 1511  HGBA1C 7.0*   Lipid Profile No results for input(s): CHOL, HDL, LDLCALC, TRIG, CHOLHDL, LDLDIRECT in the last 72 hours. Thyroid function studies No results for input(s): TSH, T4TOTAL, T3FREE, THYROIDAB in the last 72 hours.  Invalid input(s): FREET3 Anemia work up No results for input(s): VITAMINB12, FOLATE, FERRITIN,  TIBC, IRON, RETICCTPCT in the last 72 hours. Urinalysis    Component Value Date/Time   COLORURINE YELLOW (A) 06/01/2021 1452   APPEARANCEUR CLEAR (A) 06/01/2021 1452   LABSPEC 1.010 06/01/2021 1452   PHURINE 5.0 06/01/2021 1452   GLUCOSEU 150 (A) 06/01/2021 1452   HGBUR NEGATIVE 06/01/2021 1452   BILIRUBINUR NEGATIVE 06/01/2021 1452   KETONESUR NEGATIVE 06/01/2021 1452   PROTEINUR 100 (A) 06/01/2021 1452   NITRITE NEGATIVE 06/01/2021 1452   LEUKOCYTESUR NEGATIVE 06/01/2021 1452   Sepsis Labs Invalid input(s): PROCALCITONIN,  WBC,  LACTICIDVEN Microbiology Recent Results (from the past 240 hour(s))  Blood culture (single)     Status: None (Preliminary result)   Collection Time: 07/25/21  1:17 PM   Specimen: BLOOD  Result Value Ref Range Status   Specimen Description BLOOD BLOOD LEFT FOREARM  Final   Special Requests   Final    BOTTLES DRAWN AEROBIC AND ANAEROBIC Blood Culture adequate volume   Culture   Final    NO GROWTH < 24 HOURS Performed at [facility], [address] [place], [place] [address]    Report Status PENDING  Incomplete  Resp Panel by RT-PCR (Flu A&B, Covid) Nasopharyngeal Swab     Status: None   Collection Time: 07/25/21  1:17 PM   Specimen: Nasopharyngeal Swab; Nasopharyngeal(NP) swabs in vial transport medium  Result Value Ref Range Status   SARS Coronavirus 2 by RT PCR NEGATIVE NEGATIVE Final    Comment: (NOTE) SARS-CoV-2 target nucleic acids are NOT DETECTED.  The SARS-CoV-2 RNA is generally detectable in upper respiratory specimens during the acute phase of infection. The lowest concentration of SARS-CoV-2 viral copies this assay can detect is 138 copies/mL. A negative result does not preclude SARS-Cov-2 infection and should not be used as the sole basis for treatment or other patient management decisions. A negative result may occur with  improper specimen collection/handling, submission of specimen other than nasopharyngeal swab,  presence of viral mutation(s) within the areas targeted by this assay, and inadequate number of viral copies(<138 copies/mL). A negative result must be combined with clinical observations, patient history, and epidemiological information. The expected result is Negative.  Fact Sheet for Patients:  EntrepreneurPulse.com.au  Fact Sheet for Healthcare Providers:  IncredibleEmployment.be  This test is no t yet approved or cleared by the [place] FDA and  has been authorized for detection and/or diagnosis of SARS-CoV-2 by FDA under an Emergency Use Authorization (EUA). This EUA will remain  in effect (meaning this test can be used) for the duration of the COVID-19 declaration under Section 564(b)(1) of the Act, 21 U.S.C.section 360bbb-3(b)(1), unless the authorization is terminated  or revoked sooner.       Influenza A by PCR NEGATIVE NEGATIVE Final   Influenza B by PCR NEGATIVE NEGATIVE Final    Comment: (  NOTE) The Xpert Xpress SARS-CoV-2/FLU/RSV plus assay is intended as an aid in the diagnosis of influenza from Nasopharyngeal swab specimens and should not be used as a sole basis for treatment. Nasal washings and aspirates are unacceptable for Xpert Xpress SARS-CoV-2/FLU/RSV testing.  Fact Sheet for Patients: EntrepreneurPulse.com.au  Fact Sheet for Healthcare Providers: IncredibleEmployment.be  This test is not yet approved or cleared by the [place] FDA and has been authorized for detection and/or diagnosis of SARS-CoV-2 by FDA under an Emergency Use Authorization (EUA). This EUA will remain in effect (meaning this test can be used) for the duration of the COVID-19 declaration under Section 564(b)(1) of the Act, 21 U.S.C. section 360bbb-3(b)(1), unless the authorization is terminated or revoked.  Performed at [facility], [address] [place], [place] [address]   MRSA Next Gen  by PCR, Nasal     Status: None   Collection Time: 07/26/21  4:40 AM   Specimen: Nasal Mucosa; Nasal Swab  Result Value Ref Range Status   MRSA by PCR Next Gen NOT DETECTED NOT DETECTED Final    Comment: (NOTE) The GeneXpert MRSA Assay (FDA approved for NASAL specimens only), is one component of a comprehensive MRSA colonization surveillance program. It is not intended to diagnose MRSA infection nor to guide or monitor treatment for MRSA infections. Test performance is not FDA approved in patients less than 48 years old. Performed at [facility], [address], [place], [place] [address]   Surgical PCR screen     Status: None   Collection Time: 07/26/21  4:40 AM   Specimen: Nasal Mucosa; Nasal Swab  Result Value Ref Range Status   MRSA, PCR NEGATIVE NEGATIVE Final   Staphylococcus aureus NEGATIVE NEGATIVE Final    Comment: (NOTE) The Xpert SA Assay (FDA approved for NASAL specimens in patients 26 years of age and older), is one component of a comprehensive surveillance program. It is not intended to diagnose infection nor to guide or monitor treatment. Performed at [facility], [address], [place], [place] [address]      Time coordinating discharge: Over 30 minutes  SIGNED:   Sidney Ace, MD  [facility] 07/26/2021, 1:46 PM Pager   If 7PM-7AM, please contact night-coverage

## 2021-07-26 NOTE — Progress Notes (Signed)
Patient underwent third toe amputation of his right foot today.  He tolerated this well.  A postop shoe has been dispensed.  He can weight-bear as tolerated.  He should minimize ambulation.  I discussed this with the patient.  The dressing can be left clean dry and intact.  It should not be changed until he follows up with me next week.  A prescription for Augmentin and Norco has been sent to his pharmacy.  Patient should follow-up with me next week for dressing change.  From podiatry standpoint is stable for discharge.

## 2021-07-26 NOTE — Anesthesia Preprocedure Evaluation (Addendum)
Anesthesia Evaluation  Patient identified by MRN, date of birth, ID band Patient awake    Reviewed: Allergy & Precautions, H&P , NPO status , Patient's Chart, lab work & pertinent test results, reviewed documented beta blocker date and time   History of Anesthesia Complications Negative for: history of anesthetic complications  Airway Mallampati: III  TM Distance: >3 FB Neck ROM: full    Dental  (+) Dental Advidsory Given, Poor Dentition, Missing, Chipped,    Pulmonary shortness of breath and with exertion, sleep apnea , COPD, neg recent URI, former smoker,    Pulmonary exam normal breath sounds clear to auscultation       Cardiovascular Exercise Tolerance: Good hypertension, (-) angina+ CAD, + Past MI, + Cardiac Stents and + Peripheral Vascular Disease  Normal cardiovascular exam+ dysrhythmias + pacemaker + Valvular Problems/Murmurs AS  Rhythm:regular Rate:Normal  S/p TAVR  TTE 2022: 1. Left ventricular ejection fraction, by estimation, is >55%. The left ventricle has normal function. Left ventricular endocardial border not optimally defined to evaluate regional wall motion. Left ventricular diastolic parameters are indeterminate.  2. Right ventricular systolic function is normal. The right ventricular size is normal.  3. Left atrial size was mildly dilated.  4. The mitral valve was not well visualized. Mild mitral valve regurgitation. No evidence of mitral stenosis.  5. The aortic valve was not well visualized. Aortic valve regurgitation is not visualized. Mild aortic valve stenosis. Aortic valve mean gradient measures 16.0 mmHg.  6. The inferior vena cava is dilated in size with <50% respiratory variability, suggesting right atrial pressure of 15 mmHg.    Neuro/Psych PSYCHIATRIC DISORDERS Depression Bipolar Disorder negative neurological ROS     GI/Hepatic Neg liver ROS, PUD, GERD  ,  Endo/Other  diabetes   Renal/GU CRFRenal disease  negative genitourinary   Musculoskeletal  (+) Arthritis , Osteoarthritis,    Abdominal (+) + obese (BMI 34),   Peds negative pediatric ROS (+)  Hematology negative hematology ROS (+) anemia ,   Anesthesia Other Findings Very hard of hearing  Past Medical History: No date: Aneurysm (arteriovenous) of coronary vessels No date: Arthritis No date: Ascending aortic aneurysm (HCC) No date: CAD (coronary artery disease) No date: Diabetes mellitus without complication (HCC) No date: History of GI bleed No date: Hyperlipidemia associated with type 2 diabetes mellitus (HCC) No date: Hypertension associated with diabetes (HCC) No date: Presence of permanent cardiac pacemaker No date: PVD (peripheral vascular disease) (HCC) No date: S/P TAVR (transcatheter aortic valve replacement)   Reproductive/Obstetrics negative OB ROS                            Anesthesia Physical  Anesthesia Plan  ASA: 4  Anesthesia Plan: General   Post-op Pain Management:    Induction: Intravenous  PONV Risk Score and Plan: 2 and TIVA and Propofol infusion  Airway Management Planned: Natural Airway and Simple Face Mask  Additional Equipment: None  Intra-op Plan:   Post-operative Plan:   Informed Consent: I have reviewed the patients History and Physical, chart, labs and discussed the procedure including the risks, benefits and alternatives for the proposed anesthesia with the patient or authorized representative who has indicated his/her understanding and acceptance.     Dental Advisory Given  Plan Discussed with: Anesthesiologist, CRNA and Surgeon  Anesthesia Plan Comments: (Discussed risks of anesthesia with patient, including possibility of difficulty with spontaneous ventilation under anesthesia necessitating airway intervention, PONV, and rare risks such as  cardiac or respiratory or neurological events. Patient understands.)         Anesthesia Quick Evaluation

## 2021-07-26 NOTE — Plan of Care (Signed)

## 2021-07-26 NOTE — Discharge Instructions (Signed)
[facility]  POST OPERATIVE INSTRUCTIONS FOR DR. Myia Bergh AND [facility]   Take your medication as prescribed.  Pain medication should be taken only as needed.  Keep the dressing clean, dry and intact.  Keep your foot elevated above the heart level for the first 48 hours.  Walking to the bathroom and brief periods of walking are acceptable, unless we have instructed you to be non-weight bearing.  Always wear your post-op shoe when walking.  Always use your crutches if you are to be non-weight bearing.  Do not take a shower. Baths are permissible as long as the foot is kept out of the water.   Every hour you are awake:  Bend your knee 15 times. Flex foot 15 times Massage calf 15 times  Call [facility] ([phone]) if any of the following problems occur: You develop a temperature or fever. The bandage becomes saturated with blood. Medication does not stop your pain. Injury of the foot occurs. Any symptoms of infection including redness, odor, or red streaks running from wound. 

## 2021-07-27 ENCOUNTER — Encounter: Payer: Self-pay | Admitting: Podiatry

## 2021-07-28 LAB — GLUCOSE, CAPILLARY: Glucose-Capillary: 84 mg/dL (ref 70–99)

## 2021-07-29 LAB — SURGICAL PATHOLOGY

## 2021-07-30 LAB — CULTURE, BLOOD (SINGLE)
Culture: NO GROWTH
Special Requests: ADEQUATE

## 2021-07-31 LAB — AEROBIC/ANAEROBIC CULTURE W GRAM STAIN (SURGICAL/DEEP WOUND): Gram Stain: NONE SEEN

## 2021-08-05 NOTE — Progress Notes (Deleted)
No show

## 2021-08-06 ENCOUNTER — Ambulatory Visit: Payer: 59 | Admitting: Pain Medicine

## 2021-08-06 DIAGNOSIS — Z7901 Long term (current) use of anticoagulants: Secondary | ICD-10-CM

## 2021-08-06 DIAGNOSIS — G894 Chronic pain syndrome: Secondary | ICD-10-CM

## 2021-08-06 DIAGNOSIS — M545 Low back pain, unspecified: Secondary | ICD-10-CM

## 2021-08-06 DIAGNOSIS — G8929 Other chronic pain: Secondary | ICD-10-CM

## 2021-08-19 DIAGNOSIS — I951 Orthostatic hypotension: Secondary | ICD-10-CM

## 2021-08-19 DIAGNOSIS — Z952 Presence of prosthetic heart valve: Secondary | ICD-10-CM | POA: Insufficient documentation

## 2021-08-19 DIAGNOSIS — R7881 Bacteremia: Secondary | ICD-10-CM

## 2021-08-19 DIAGNOSIS — M869 Osteomyelitis, unspecified: Secondary | ICD-10-CM

## 2021-08-19 DIAGNOSIS — M1990 Unspecified osteoarthritis, unspecified site: Secondary | ICD-10-CM | POA: Insufficient documentation

## 2021-08-19 DIAGNOSIS — B9561 Methicillin susceptible Staphylococcus aureus infection as the cause of diseases classified elsewhere: Secondary | ICD-10-CM

## 2021-08-19 DIAGNOSIS — R531 Weakness: Secondary | ICD-10-CM

## 2021-08-19 DIAGNOSIS — Z8719 Personal history of other diseases of the digestive system: Secondary | ICD-10-CM

## 2021-09-08 NOTE — Progress Notes (Deleted)
Visit counseled late by patient.

## 2021-09-09 ENCOUNTER — Ambulatory Visit: Payer: 59 | Admitting: Pain Medicine

## 2021-09-11 ENCOUNTER — Encounter: Payer: Self-pay | Admitting: Pain Medicine

## 2021-09-11 ENCOUNTER — Telehealth: Payer: Self-pay | Admitting: *Deleted

## 2021-09-11 NOTE — Telephone Encounter (Signed)
Attempted to call for pre appointment review of allergies/meds. Message left with person who answered the phone.

## 2021-09-12 NOTE — Progress Notes (Signed)
PROVIDER NOTE: Information contained herein reflects review and annotations entered in association with encounter. Interpretation of such information and data should be left to medically-trained personnel. Information provided to patient can be located elsewhere in the medical record under "Patient Instructions". Document created using STT-dictation technology, any transcriptional errors that may result from process are unintentional.    Patient: Nathan Singleton  Service Category: E/M  Provider: Gaspar Cola, MD  DOB: [birth year]  DOS: 09/15/2021  Specialty: Interventional Pain Management  MRN: [id number]  Setting: Ambulatory outpatient  PCP: Ranae Plumber, PA  Type: Established Patient    Referring Provider: Ranae Plumber, PA  Location: Office  Delivery: Face-to-face     HPI  Nathan Singleton, a 80 y.o. year old male, is here today because of his Chronic left shoulder pain [M25.512, G89.29]. Nathan Singleton primary complain today is No chief complaint on file. Last encounter: My last encounter with him was on 09/09/2021. Pertinent problems: Nathan Singleton does not have any pertinent problems on file. Pain Assessment: Severity of Chronic pain is reported as a 8 /10. Location: Shoulder Left/pain radiaities down his left arm to his elbow at times. Onset: More than a month ago. Quality: Aching, Stabbing, Sharp, Shooting. Timing: Constant. Modifying factor(s): lidocanie patches,. Vitals:  height is 6' (1.829 m) and weight is 227 lb (103 kg). His temperature is 97 F (36.1 C) (abnormal). His blood pressure is 156/70 (abnormal) and his pulse is 61. His oxygen saturation is 100%.   Reason for encounter: follow-up evaluation.  The patient has been hospitalized several times and not been able to keep his appointments.  Currently what he used to describe as his third worst pain is now his first.  He complains of pain in the left shoulder and this is followed by the pain in the lower back and then in the right  foot where he recently had an amputation.  The patient comes in today for evaluation for possible procedure.  At this point, since his worst pain is that of the left shoulder we will go ahead and schedule him for an intra-articular shoulder injection.  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  The patient does have diabetes and he was informed that the use of steroids will increase his blood sugar for at least 2 weeks.  The patient's wife indicates that this morning his blood sugar was 117.  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  Today I we will be ordering some blood work to check to make sure that he is doing well and does not currently have an ongoing infection.  Pharmacotherapy Assessment  Analgesic: Hydrocodone/APAP 10/325 1 tab p.o. 3 times daily (30 mg/day of hydrocodone) (30 MME) MME/day: 30 mg/day   Monitoring: [place] PMP: PDMP reviewed during this encounter.       Pharmacotherapy: No side-effects or adverse reactions reported. Compliance: No problems identified. Effectiveness: Clinically acceptable.  Chauncey Fischer, RN  09/15/2021 11:53 AM  Sign when Signing Visit Safety precautions to be maintained throughout the outpatient stay will include: orient to surroundings, keep bed in low position, maintain call bell within reach at all times, provide assistance with transfer out of bed and ambulation.      UDS:  Summary  Date Value Ref Range Status  03/03/2021 Note  Final    Comment:    ==================================================================== Compliance Drug Analysis, Ur ==================================================================== Test  Result       Flag       Units  Drug Present   Hydrocodone                    421                     ng/mg creat   Hydromorphone                  177                      ng/mg creat   Norhydrocodone                 995                     ng/mg creat    Sources of hydrocodone include scheduled prescription medications.    Hydromorphone and norhydrocodone are expected metabolites of    hydrocodone. Hydromorphone is also available as a scheduled    prescription medication.    Gabapentin                     PRESENT   Baclofen                       PRESENT   Sertraline                     PRESENT   Desmethylsertraline            PRESENT    Desmethylsertraline is an expected metabolite of sertraline.    Acetaminophen                  PRESENT   Doxylamine                     PRESENT   Dextrorphan/Levorphanol        PRESENT    Dextrorphan is an expected metabolite of dextromethorphan, an over-    the-counter or prescription cough suppressant. Levorphanol is a    scheduled prescription medication. Dextrorphan cannot be    distinguished from levorphanol by the method used for analysis.  ==================================================================== Test                      Result    Flag   Units      Ref Range   Creatinine              61               mg/dL      >=20 ==================================================================== Declared Medications:  Medication list was not provided. ==================================================================== For clinical consultation, please call [phone]. ====================================================================      ROS  Constitutional: Denies any fever or chills Gastrointestinal: No reported hemesis, hematochezia, vomiting, or acute GI distress Musculoskeletal: Denies any acute onset joint swelling, redness, loss of ROM, or weakness Neurological: No reported episodes of acute onset apraxia, aphasia, dysarthria, agnosia, amnesia, paralysis, loss of coordination, or loss of consciousness  Medication Review  Calcium Citrate-Vitamin D, Vitamin D3, acetaminophen, albuterol,  allopurinol, amLODipine, aspirin, carvedilol, clopidogrel, gabapentin, insulin glargine-yfgn, isosorbide mononitrate, loperamide, multivitamin, pantoprazole, rosuvastatin, and sertraline  History Review  Allergy: Nathan Singleton is allergic to atorvastatin, celecoxib, dicyclomine hcl, doxycycline, and metformin. Drug: Nathan Singleton  reports that he does not currently use drugs. Alcohol:  reports that he does  not currently use alcohol. Tobacco:  reports that he has quit smoking. He has never used smokeless tobacco. Social: Nathan Singleton  reports that he has quit smoking. He has never used smokeless tobacco. He reports that he does not currently use alcohol. He reports that he does not currently use drugs. Medical:  has a past medical history of Aneurysm (arteriovenous) of coronary vessels, Arthritis, Ascending aortic aneurysm, CAD (coronary artery disease), Diabetes mellitus without complication ([place]), Hard of hearing, History of GI bleed, Hyperlipidemia associated with type 2 diabetes mellitus ([place]), Hypertension associated with diabetes ([place]), Presence of permanent cardiac pacemaker, PVD (peripheral vascular disease) ([place]), and S/P TAVR (transcatheter aortic valve replacement). Surgical: Nathan Singleton  has a past surgical history that includes heart valve replaced; Pacemaker generator change; Replacement total knee bilateral; RTC; Ankle repaired (Right); DG CHOLECYSTOGRAPHY GALL BLADDER ([facility]); Gastric bypass; Carpal Tunnel repaired (Left); STENT PLACE LEFT URETER ([facility]) (Right); Colonoscopy; Copillar implant ; Cataract extraction (Bilateral); Toe nail removed (Bilateral); Lower Extremity Angiography (Right, 02/17/2021); Irrigation and debridement foot (Right, 02/18/2021); Amputation toe (Right, 03/20/2021); TEE without cardioversion (N/A, 03/26/2021); Lower Extremity Angiography (Right, 03/27/2021); Amputation toe (Right, 06/03/2021); TEE without cardioversion (N/A, 06/04/2021); and Amputation toe (Right,  07/26/2021). Family: family history includes Alzheimer's disease in his mother; Diabetes in his father; Heart disease in his father and mother.  Laboratory Chemistry Profile   Renal Lab Results  Component Value Date   BUN 29 (H) 07/26/2021   CREATININE 1.19 07/26/2021   BCR 31 (H) 03/03/2021   GFRAA 76 11/19/2020   GFRNONAA >60 07/26/2021    Hepatic Lab Results  Component Value Date   AST 36 06/01/2021   ALT 28 06/01/2021   ALBUMIN 3.7 06/01/2021   ALKPHOS 83 06/01/2021   LIPASE 34 03/31/2021    Electrolytes Lab Results  Component Value Date   NA 138 07/26/2021   K 4.3 07/26/2021   CL 107 07/26/2021   CALCIUM 8.5 (L) 07/26/2021   MG 1.9 05/25/2021    Bone Lab Results  Component Value Date   25OHVITD1 32 03/03/2021   25OHVITD2 1.0 03/03/2021   25OHVITD3 31 03/03/2021    Inflammation (CRP: Acute Phase) (ESR: Chronic Phase) Lab Results  Component Value Date   CRP 7.7 (H) 06/05/2021   ESRSEDRATE 54 (H) 07/25/2021   LATICACIDVEN 1.1 07/25/2021         Note: Above Lab results reviewed.  Recent Imaging Review  DG Foot Complete Right CLINICAL DATA:  Previous amputation.  Swelling of the third toe.  EXAM: RIGHT FOOT COMPLETE - 3+ VIEW  COMPARISON:  06/01/2021  FINDINGS: Previous amputation of the great toe and second toe at the MTP joint level. Patient now shows lytic destruction of the distal phalanx of the third toe consistent with osteomyelitis. Evidence of bone infection elsewhere. Ordinary degenerative changes present in the midfoot. Ordinary calcaneal spurs. Arterial calcification is noted.  IMPRESSION: Previous amputation of the great toe and second toe at the MTP joints.  Newly seen lytic destruction of the distal phalanx the third toe consistent with osteomyelitis.  Electronically Signed   By: Nelson Chimes M.D.   On: 07/25/2021 14:12 Note: Reviewed        Physical Exam  General appearance: Well nourished, well developed, and well hydrated.  In no apparent acute distress Mental status: Alert, oriented x 3 (person, place, & time)       Respiratory: No evidence of acute respiratory distress Eyes: PERLA Vitals: BP (!) 156/70   Pulse 61  Temp (!) 97 F (36.1 C)   Ht 6' (1.829 m)   Wt 227 lb (103 kg)   SpO2 100%   BMI 30.79 kg/m  BMI: Estimated body mass index is 30.79 kg/m as calculated from the following:   Height as of this encounter: 6' (1.829 m).   Weight as of this encounter: 227 lb (103 kg). Ideal: Ideal body weight: 77.6 kg (171 lb 1.2 oz) Adjusted ideal body weight: 87.7 kg (193 lb 7.1 oz)  Assessment   Status Diagnosis  Controlled Controlled Controlled 1. Chronic shoulder pain (3ry area of Pain) (Left)   2. Osteoarthritis of shoulder (Left)   3. Osteoarthritis of AC (acromioclavicular) joint (Left)   4. Osteoarthritis of glenohumeral joint (Left)   5. Limited internal rotation of glenohumeral joint of shoulder (Left)   6. Pharmacologic therapy   7. Chronic anticoagulation (Plavix)   8. Disorder of skeletal system   9. Problems influencing health status   10. 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)      Updated Problems: Problem  Osteoarthritis of shoulder (Left)  Osteoarthritis of AC (acromioclavicular) joint (Left)  Osteoarthritis of glenohumeral joint (Left)  Limited internal rotation of glenohumeral joint of shoulder (Left)  Arthritis  Peripheral Vascular Disease (Hcc)  Osteomyelitis (Hcc)  Spondylosis Without Myelopathy Or Radiculopathy, Lumbosacral Region  Osteoarthritis of Facet Joint At L5-S1 Level of Lumbosacral Spine  Lumbar facet joint syndrome (Bilateral) (L>R)  Chronic Pain Syndrome  Chronic lower extremity pain (2ry area of Pain) (Bilateral) (R>L)  Chronic shoulder pain (3ry area of Pain) (Left)  History of total knee replacement (Bilateral)  Ddd (Degenerative Disc Disease), Thoracic  Cervical facet hypertrophy (Multilevel) (Bilateral)  Ddd  (Degenerative Disc Disease), Cervical  Claudication (Hcc)  Contusion of Right Thigh  Gout  Right Leg Swelling  Closed Nondisplaced Fracture of Greater Trochanter of Right Femur (Hcc)  Ddd (Degenerative Disc Disease), Lumbar  Chronic low back pain (1ry area of Pain) (Bilateral) (L>R) w/o sciatica  Presence of Prosthetic Heart Valve  Oa (Osteoarthritis) of Knee  Type 1 Diabetes Mellitus With Peripheral Circulatory Complications (Hcc)  S/P Tavr (Transcatheter Aortic Valve Replacement)  History of GI Bleed  Mssa Bacteremia  Osteomyelitis of Great Toe (Hcc)  Ams (Altered Mental Status)  Ckd Stage G3a/A3, Gfr 45-59 and Albumin Creatinine Ratio >300 Mg/G (Hcc)  Type 2 Diabetes Mellitus With Diabetic Chronic Kidney Disease (Hcc)  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 Disease  Syncope and Collapse  Syncope, Vasovagal  CKD (chronic kidney disease), stage IIIa  Right Great Toe Amputee (Hcc)  Anemia of Chronic Disease  Cellulitis of Second Toe of Right Foot  Type 2 Diabetes Mellitus With Hyperlipidemia (Hcc)  Chronic Kidney Disease, Stage 3a (Hcc)  Dizziness  Pharmacologic Therapy  Disorder of Skeletal System  Problems Influencing Health Status  Uncomplicated Opioid Dependence (Hcc)  Chronic anticoagulation (Plavix)   Plavix anticoagulation: (Stop: 7-10 days  Restart: 2 hours)   Chronic Kidney Disease (Ckd)   Formatting of this note might be different from the original. GFR 46 mL/min   Copd (Chronic Obstructive Pulmonary Disease) (Hcc)  Erectile Dysfunction  Gerd Without Esophagitis  Myocardial Infarction (Hcc)  Sleep Apnea   Formatting of this note might be different from the original. uses cpap   Venous Insufficiency  Vitamin D Deficiency  Thrombocytopenia (Hcc)  Presence of Permanent Cardiac Pacemaker  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  Pacemaker St Jude device  Peripheral vascular disease, unspecified (HCC) stents to popliteal  arteries  many years ago  Coronary Artery Disease Involving Coronary Bypass Graft of Native Heart With Angina Pectoris (Hcc)  Tietze's Syndrome  Deafness, Left  Nonrheumatic Aortic Valve Stenosis  Disequilibrium  Obstructive Sleep Apnea of Adult  Heart Block   Formatting of this note might be different from the original. Added automatically from request for surgery [id number]   Right Bundle Branch Block  Insomnia  Status Post Percutaneous Transluminal Coronary Angioplasty  Orthostatic Hypotension  Weakness  Htn (Hypertension)  Aki (Acute Kidney Injury) (Hcc)  Edema, Unspecified  Proteinuria, Unspecified  Bacteremia  Cap (Community Acquired Pneumonia)  Diarrhea  Bph (Benign Prostatic Hyperplasia)  Diabetic Foot Ulcer (Hcc)  Hyperlipidemia Associated With Type 2 Diabetes Mellitus (Hcc)   Formatting of this note might be different from the original. Last Assessment & Plan:  Formatting of this note might be different from the original. lipid control important in reducing the progression of atherosclerotic disease. Continue statin therapy   Normocytic Anemia  Dyspnea  Hyperlipidemia, Acquired  Hyperlipidemia  Personal History of Other Diseases of Urinary System  Varicose Veins  Chest Pain  Hypertension Associated With Diabetes (Hcc)  History of Gingival Bleeding  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  Dm2 (Diabetes Mellitus, Type 2) (Hcc)  Urinary Retention  Obesity  Retinopathy   Last Assessment & Plan:  Formatting of this note might be different from the original. Glimepiride 1 mg twice a day Formatting of this note might be different from the original. Last Assessment & Plan:  Glimepiride 1 mg twice a day   Left Asymmetrical Snhl  Bilateral Tinnitus  Hypertrophy of Nasal Turbinates  Hypertrophy of Tonsils  Calculus of Gallbladder With Acute Cholecystitis Without Obstruction   Formatting of this note might be different from the original. Added  automatically from request for surgery [id number]   Gastrojejunal Ulcer  Jejunal Ulcer  Blisters of Multiple Sites  Cellulitis  Coronary Arteriosclerosis  Bipolar Disorder (Hcc)  Depression  Hearing Loss    Plan of Care  Problem-specific:  No problem-specific Assessment & Plan notes found for this encounter.  Nathan Singleton has a current medication list which includes the following long-term medication(s): albuterol, allopurinol, gabapentin, isosorbide mononitrate, pantoprazole, rosuvastatin, sertraline, and amlodipine.  Pharmacotherapy (Medications Ordered): No orders of the defined types were placed in this encounter.  Orders:  Orders Placed This Encounter  Procedures   SHOULDER INJECTION    Standing Status:   Future    Standing Expiration Date:   12/16/2021    Scheduling Instructions:     Procedure: Intra-articular shoulder (Glenohumeral) joint and (AC) Acromioclavicular joint injection     Side: Left-sided     Level: Glenohumeral joint and (AC) Acromioclavicular joint     Sedation: Patient's choice.     Timeframe: As permitted by the schedule    Order Specific Question:   Where will this procedure be performed?    Answer:   [facility]   CBC with Differential/Platelet    Cc PCP: Ranae Plumber, PA    Order Specific Question:   CC Results    Answer:   [id number] [[id number]]    Order Specific Question:   Release to patient    Answer:   Immediate   Sedimentation rate    Indication: Disorder of skeletal system (M89.9) Cc PCP: Ranae Plumber, PA    Order Specific Question:   CC Results    Answer:   [id number] [[id number]]    Order Specific Question:  Release to patient    Answer:   Immediate   C-reactive protein    Indication: Problems influencing health status (Z78.9) Cc PCP: Ranae Plumber, PA    Order Specific Question:   CC Results    Answer:   [id number] [[id number]]    Order Specific Question:   Release to patient    Answer:   Immediate   Blood Thinner  Instructions to Nursing    If unable to stop, ask if Lovenox-bridge therapy may be possible, and if so, request their assistance in implementing it.    Scheduling Instructions:     Contact the physician prescribing the blood thinner and request clearance to stop it for time period stipulated below.     If approved by prescribing physician, stop Plavix (Clopidogrel) x 7-10 days prior to procedure or surgery.   Blood Thinner Instructions to Nursing    Always make sure patient has clearance from prescribing physician to stop blood thinners for interventional therapies. If the patient requires a Lovenox-bridge therapy, make sure arrangements are made to institute it with the assistance of the PCP.    Scheduling Instructions:     Have Nathan Singleton stop the Plavix (Clopidogrel) x 7-10 days prior to procedure or surgery.    Follow-up plan:   Return for (Clinic) procedure: (L) IA Shoulder inj #1, (NS), (Blood Thinner Protocol).     Interventional Therapies  Risk  Complexity Considerations:   Estimated body mass index is 31.19 kg/m as calculated from the following:   Height as of this encounter: 6' (1.829 m).   Weight as of this encounter: 230 lb (104.3 kg). NOTE: Plavix Anticoagulation (Stop: 7-10 days  Restart: 2 hours)    Planned  Pending:   Diagnostic bilateral lumbar facet block #1    Under consideration:   Diagnostic bilateral lumbar facet block #1    Completed:   None at this time   Therapeutic  Palliative (PRN) options:   None established     Recent Visits No visits were found meeting these conditions. Showing recent visits within past 90 days and meeting all other requirements Today's Visits Date Type Provider Dept  09/15/21 Office Visit Milinda Pointer, MD [facility]  Showing today's visits and meeting all other requirements Future Appointments No visits were found meeting these conditions. Showing future appointments within next 90 days and meeting all  other requirements I discussed the assessment and treatment plan with the patient. The patient was provided an opportunity to ask questions and all were answered. The patient agreed with the plan and demonstrated an understanding of the instructions.  Patient advised to call back or seek an in-person evaluation if the symptoms or condition worsens.  Duration of encounter: 30 minutes.  Note by: Gaspar Cola, MD Date: 09/15/2021; Time: 1:11 PM

## 2021-09-15 ENCOUNTER — Encounter: Payer: Self-pay | Admitting: Pain Medicine

## 2021-09-15 ENCOUNTER — Ambulatory Visit: Payer: 59 | Attending: Pain Medicine | Admitting: Pain Medicine

## 2021-09-15 ENCOUNTER — Other Ambulatory Visit: Payer: Self-pay

## 2021-09-15 VITALS — BP 156/70 | HR 61 | Temp 97.0°F | Ht 72.0 in | Wt 227.0 lb

## 2021-09-15 DIAGNOSIS — G8929 Other chronic pain: Secondary | ICD-10-CM | POA: Diagnosis present

## 2021-09-15 DIAGNOSIS — Z7901 Long term (current) use of anticoagulants: Secondary | ICD-10-CM | POA: Diagnosis present

## 2021-09-15 DIAGNOSIS — Z79899 Other long term (current) drug therapy: Secondary | ICD-10-CM

## 2021-09-15 DIAGNOSIS — M25612 Stiffness of left shoulder, not elsewhere classified: Secondary | ICD-10-CM | POA: Insufficient documentation

## 2021-09-15 DIAGNOSIS — M19012 Primary osteoarthritis, left shoulder: Secondary | ICD-10-CM

## 2021-09-15 DIAGNOSIS — M25512 Pain in left shoulder: Secondary | ICD-10-CM | POA: Diagnosis present

## 2021-09-15 DIAGNOSIS — E1051 Type 1 diabetes mellitus with diabetic peripheral angiopathy without gangrene: Secondary | ICD-10-CM | POA: Diagnosis present

## 2021-09-15 DIAGNOSIS — M899 Disorder of bone, unspecified: Secondary | ICD-10-CM | POA: Diagnosis present

## 2021-09-15 DIAGNOSIS — Z789 Other specified health status: Secondary | ICD-10-CM

## 2021-09-15 NOTE — Progress Notes (Signed)
Safety precautions to be maintained throughout the outpatient stay will include: orient to surroundings, keep bed in low position, maintain call bell within reach at all times, provide assistance with transfer out of bed and ambulation.  

## 2021-09-16 ENCOUNTER — Telehealth: Payer: Self-pay

## 2021-09-16 LAB — CBC WITH DIFFERENTIAL/PLATELET
Basophils Absolute: 0 10*3/uL (ref 0.0–0.2)
Basos: 1 %
EOS (ABSOLUTE): 0.2 10*3/uL (ref 0.0–0.4)
Eos: 5 %
Hematocrit: 28.4 % — ABNORMAL LOW (ref 37.5–51.0)
Hemoglobin: 9.2 g/dL — ABNORMAL LOW (ref 13.0–17.7)
Immature Grans (Abs): 0 10*3/uL (ref 0.0–0.1)
Immature Granulocytes: 0 %
Lymphocytes Absolute: 0.9 10*3/uL (ref 0.7–3.1)
Lymphs: 27 %
MCH: 30.4 pg (ref 26.6–33.0)
MCHC: 32.4 g/dL (ref 31.5–35.7)
MCV: 94 fL (ref 79–97)
Monocytes Absolute: 0.3 10*3/uL (ref 0.1–0.9)
Monocytes: 7 %
Neutrophils Absolute: 2 10*3/uL (ref 1.4–7.0)
Neutrophils: 60 %
Platelets: 111 10*3/uL — ABNORMAL LOW (ref 150–450)
RBC: 3.03 x10E6/uL — ABNORMAL LOW (ref 4.14–5.80)
RDW: 14.8 % (ref 11.6–15.4)
WBC: 3.4 10*3/uL (ref 3.4–10.8)

## 2021-09-16 LAB — SEDIMENTATION RATE: Sed Rate: 52 mm/hr — ABNORMAL HIGH (ref 0–30)

## 2021-09-16 LAB — C-REACTIVE PROTEIN: CRP: <1 mg/L (ref 0–10)

## 2021-09-16 NOTE — Telephone Encounter (Signed)
This was approved on 04-30-21. I left a message at patient's home phone, attempted mobile number, was unable to leave a message.

## 2021-09-16 NOTE — Telephone Encounter (Signed)
Do we have clearance for blood thinners? Check out says blood thinner protocol before procedure.

## 2021-09-25 ENCOUNTER — Other Ambulatory Visit: Payer: Self-pay

## 2021-09-25 ENCOUNTER — Ambulatory Visit (HOSPITAL_BASED_OUTPATIENT_CLINIC_OR_DEPARTMENT_OTHER): Payer: 59 | Admitting: Pain Medicine

## 2021-09-25 ENCOUNTER — Ambulatory Visit
Admission: RE | Admit: 2021-09-25 | Discharge: 2021-09-25 | Disposition: A | Discharge status: Home / Self Care | Payer: 59 | Source: Ambulatory Visit | Attending: Pain Medicine | Admitting: Pain Medicine

## 2021-09-25 VITALS — BP 148/63 | HR 72 | Temp 97.4°F | Resp 16 | Ht 72.0 in | Wt 227.0 lb

## 2021-09-25 DIAGNOSIS — Z5189 Encounter for other specified aftercare: Secondary | ICD-10-CM

## 2021-09-25 DIAGNOSIS — M19012 Primary osteoarthritis, left shoulder: Secondary | ICD-10-CM

## 2021-09-25 DIAGNOSIS — G8929 Other chronic pain: Secondary | ICD-10-CM | POA: Diagnosis present

## 2021-09-25 DIAGNOSIS — M25512 Pain in left shoulder: Secondary | ICD-10-CM | POA: Diagnosis present

## 2021-09-25 MED ORDER — ROPIVACAINE HCL 2 MG/ML IJ SOLN
9.0000 mL | Freq: Once | INTRAMUSCULAR | Status: AC
  Administered 2021-09-25: 9 mL via INTRA_ARTICULAR

## 2021-09-25 MED ORDER — METHYLPREDNISOLONE ACETATE 80 MG/ML IJ SUSP
INTRAMUSCULAR | Status: AC
  Filled 2021-09-25: qty 1

## 2021-09-25 MED ORDER — PENTAFLUOROPROP-TETRAFLUOROETH EX AERO
INHALATION_SPRAY | Freq: Once | CUTANEOUS | Status: AC
  Administered 2021-09-25: 30 via TOPICAL
  Filled 2021-09-25: qty 116

## 2021-09-25 MED ORDER — LIDOCAINE HCL 2 % IJ SOLN
INTRAMUSCULAR | Status: AC
  Filled 2021-09-25: qty 20

## 2021-09-25 MED ORDER — ROPIVACAINE HCL 2 MG/ML IJ SOLN
INTRAMUSCULAR | Status: AC
  Filled 2021-09-25: qty 20

## 2021-09-25 MED ORDER — LIDOCAINE HCL 2 % IJ SOLN
20.0000 mL | Freq: Once | INTRAMUSCULAR | Status: AC
  Administered 2021-09-25: 400 mg

## 2021-09-25 MED ORDER — METHYLPREDNISOLONE ACETATE 80 MG/ML IJ SUSP
80.0000 mg | Freq: Once | INTRAMUSCULAR | Status: AC
  Administered 2021-09-25: 80 mg via INTRA_ARTICULAR

## 2021-09-25 NOTE — Progress Notes (Signed)
PROVIDER NOTE: Information contained herein reflects review and annotations entered in association with encounter. Interpretation of such information and data should be left to medically-trained personnel. Information provided to patient can be located elsewhere in the medical record under "Patient Instructions". Document created using STT-dictation technology, any transcriptional errors that may result from process are unintentional.    Patient: Nathan Singleton  Service Category: Procedure  Provider: Gaspar Cola, MD  DOB: 12/03/1941  DOS: 09/25/2021  Location: [facility]  MRN: [id number]  Setting: Ambulatory - outpatient  Referring Provider: Ranae Plumber, PA  Type: Established Patient  Specialty: Interventional Pain Management  PCP: Ranae Plumber, [place]   Primary Reason for Visit: Interventional Pain Management Treatment. CC: Shoulder Pain (left)    Procedure:          Anesthesia, Analgesia, Anxiolysis:  Type: Diagnostic Glenohumeral Joint (shoulder) Injection          Primary Purpose: Diagnostic Region: Superior Shoulder Area Level:  Shoulder Target Area: Glenohumeral Joint (shoulder) Approach: Anterior approach. Laterality: Left  Type: Local Anesthesia Local Anesthetic: Lidocaine 1-2% Sedation: None  Indication(s):  Analgesia Route: Infiltration ([place]/IM) IV Access: N/A   Position: Supine   Indications: 1. Chronic shoulder pain (3ry area of Pain) (Left)   2. Osteoarthritis of AC (acromioclavicular) joint (Left)   3. Osteoarthritis of glenohumeral joint (Left)   4. Osteoarthritis of shoulder (Left)   5. Encounter for therapeutic procedure    Pain Score: Pre-procedure: 8 /10 Post-procedure: 4 /10     Pre-op H&P Assessment:  Nathan Singleton is a 80 y.o. (year old), male patient, seen today for interventional treatment. 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 Nathan Singleton has a current medication list which includes the following prescription(s): acetaminophen, albuterol, allopurinol, bayer aspirin ec low dose, calcium citrate+d3 petites, carvedilol, carvedilol, vitamin d3, clopidogrel, gabapentin, insulin glargine-yfgn, isosorbide mononitrate, loperamide, multivitamin, pantoprazole, rosuvastatin, sertraline, and amlodipine. His primarily concern today is the Shoulder Pain (left)  Initial Vital Signs:  Pulse/HCG Rate: 75  Temp: (!) 97.4 F (36.3 C) Resp: 18 BP: (!) 157/68 SpO2: 100 %  BMI: Estimated body mass index is 30.79 kg/m as calculated from the following:   Height as of this encounter: 6' (1.829 m).   Weight as of this encounter: 227 lb (103 kg).  Risk Assessment: Allergies: Reviewed. He is allergic to atorvastatin, celecoxib, dicyclomine hcl, doxycycline, and metformin.  Allergy Precautions: None required Coagulopathies: Reviewed. None identified.  Blood-thinner therapy: None at this time Active Infection(s): Reviewed. None identified. Nathan Singleton is afebrile  Site Confirmation: Nathan Singleton was asked to confirm the procedure and laterality before marking the site Procedure checklist: Completed Consent: Before the procedure and under the influence of no sedative(s), amnesic(s), or anxiolytics, the patient was informed of the treatment options, risks and possible  complications. To fulfill our ethical and legal obligations, as recommended by the American Medical Association's Code of Ethics, I have informed the patient of my clinical impression; the nature and purpose of the treatment  or procedure; the risks, benefits, and possible complications of the intervention; the alternatives, including doing nothing; the risk(s) and benefit(s) of the alternative treatment(s) or procedure(s); and the risk(s) and benefit(s) of doing nothing. The patient was provided information about the general risks and possible complications associated with the procedure. These may include, but are not limited to: failure to achieve desired goals, infection, bleeding, organ or nerve damage, allergic reactions, paralysis, and death. In addition, the patient was informed of those risks and complications associated to the procedure, such as failure to decrease pain; infection; bleeding; organ or nerve damage with subsequent damage to sensory, motor, and/or autonomic systems, resulting in permanent pain, numbness, and/or weakness of one or several areas of the body; allergic reactions; (i.e.: anaphylactic reaction); and/or death. Furthermore, the patient was informed of those risks and complications associated with the medications. These include, but are not limited to: allergic reactions (i.e.: anaphylactic or anaphylactoid reaction(s)); adrenal axis suppression; blood sugar elevation that in diabetics may result in ketoacidosis or comma; water retention that in patients with history of congestive heart failure may result in shortness of breath, pulmonary edema, and decompensation with resultant heart failure; weight gain; swelling or edema; medication-induced neural toxicity; particulate matter embolism and blood vessel occlusion with resultant organ, and/or nervous system infarction; and/or aseptic necrosis of one or more joints. Finally, the patient was informed that Medicine is not an exact  science; therefore, there is also the possibility of unforeseen or unpredictable risks and/or possible complications that may result in a catastrophic outcome. The patient indicated having understood very clearly. We have given the patient no guarantees and we have made no promises. Enough time was given to the patient to ask questions, all of which were answered to the patient's satisfaction. Mr. Slimp has indicated that he wanted to continue with the procedure. Attestation: I, the ordering provider, attest that I have discussed with the patient the benefits, risks, side-effects, alternatives, likelihood of achieving goals, and potential problems during recovery for the procedure that I have provided informed consent. Date  Time: 09/25/2021 10:41 AM  Pre-Procedure Preparation:  Monitoring: As per clinic protocol. Respiration, ETCO2, SpO2, BP, heart rate and rhythm monitor placed and checked for adequate function Safety Precautions: Patient was assessed for positional comfort and pressure points before starting the procedure. Time-out: I initiated and conducted the "Time-out" before starting the procedure, as per protocol. The patient was asked to participate by confirming the accuracy of the "Time Out" information. Verification of the correct person, site, and procedure were performed and confirmed by me, the nursing staff, and the patient. "Time-out" conducted as per Joint Commission's Universal Protocol (UP.01.01.01). Time: 1107  Description of Procedure:          Area Prepped: Entire shoulder Area DuraPrep (Iodine Povacrylex [0.7% available iodine] and Isopropyl Alcohol, 74% w/w) Safety Precautions: Aspiration looking for blood return was conducted prior to all injections. At no point did we inject any substances, as a needle was being advanced. No attempts were made at seeking any paresthesias. Safe injection practices and needle disposal techniques used. Medications properly checked for  expiration dates. SDV (single dose vial) medications used. Description of the Procedure: Protocol guidelines were followed. The patient was placed in position over the procedure table. The target area was identified and the area prepped in the usual manner. Skin & deeper tissues infiltrated with local anesthetic. Appropriate amount of time allowed to pass for local anesthetics to take effect. The procedure needles were then advanced to  the target area. Proper needle placement secured. Negative aspiration confirmed. Solution injected in intermittent fashion, asking for systemic symptoms every 0.5cc of injectate. The needles were then removed and the area cleansed, making sure to leave some of the prepping solution back to take advantage of its long term bactericidal properties.         Vitals:   09/25/21 1040 09/25/21 1058 09/25/21 1108 09/25/21 1116  BP: (!) 157/68 (!) 142/85 (!) 144/61 (!) 148/63  Pulse: 75 72 70 72  Resp: '18 18 18 16  '$ Temp: (!) 97.4 F (36.3 C)     SpO2: 100% 99% 98% 99%  Weight: 227 lb (103 kg)     Height: 6' (1.829 m)       Start Time: 1108 hrs. End Time: 1114 hrs. Materials:  Needle(s) Type: Spinal Needle Gauge: 22G Length: 3.5-in Medication(s): Please see orders for medications and dosing details.  Imaging Guidance (Non-Spinal):          Type of Imaging Technique: Fluoroscopy Guidance (Non-Spinal) Indication(s): Assistance in needle guidance and placement for procedures requiring needle placement in or near specific anatomical locations not easily accessible without such assistance. Exposure Time: Please see nurses notes. Contrast: None used. Fluoroscopic Guidance: I was personally present during the use of fluoroscopy. "Tunnel Vision Technique" used to obtain the best possible view of the target area. Parallax error corrected before commencing the procedure. "Direction-depth-direction" technique used to introduce the needle under continuous pulsed fluoroscopy.  Once target was reached, antero-posterior, oblique, and lateral fluoroscopic projection used confirm needle placement in all planes. Images permanently stored in EMR. Interpretation: No contrast injected. I personally interpreted the imaging intraoperatively. Adequate needle placement confirmed in multiple planes. Permanent images saved into the patient's record.  Antibiotic Prophylaxis:   Anti-infectives (From admission, onward)    None      Indication(s): None identified  Post-operative Assessment:  Post-procedure Vital Signs:  Pulse/HCG Rate: 72  Temp: (!) 97.4 F (36.3 C) Resp: 16 BP: (!) 148/63 SpO2: 99 %  EBL: None  Complications: No immediate post-treatment complications observed by team, or reported by patient.  Note: The patient tolerated the entire procedure well. A repeat set of vitals were taken after the procedure and the patient was kept under observation following institutional policy, for this type of procedure. Post-procedural neurological assessment was performed, showing return to baseline, prior to discharge. The patient was provided with post-procedure discharge instructions, including a section on how to identify potential problems. Should any problems arise concerning this procedure, the patient was given instructions to immediately contact us, at any time, without hesitation. In any case, we plan to contact the patient by telephone for a follow-up status report regarding this interventional procedure.  Comments:  No additional relevant information.  Plan of Care  Orders:  Orders Placed This Encounter  Procedures   SHOULDER INJECTION    Scheduling Instructions:     Procedure: Intra-articular shoulder (Glenohumeral) joint and (AC) Acromioclavicular joint injection     Side: Left-sided     Level: Glenohumeral joint and (AC) Acromioclavicular joint     Sedation: Patient's choice.     Timeframe: Today    Order Specific Question:   Where will this procedure  be performed?    Answer:   [facility]   DG [facility] C-ARM 1-60 MIN NO REPORT    Intraoperative interpretation by procedural physician at [place].    Standing Status:   Standing    Number of Occurrences:  1    Order Specific Question:   Reason for exam:    Answer:   Assistance in needle guidance and placement for procedures requiring needle placement in or near specific anatomical locations not easily accessible without such assistance.   Informed Consent Details: Physician/Practitioner Attestation; Transcribe to consent form and obtain patient signature    Note: Always confirm laterality of pain with Mr. Shutes, before procedure.    Order Specific Question:   Physician/Practitioner attestation of informed consent for procedure/surgical case    Answer:   I, the physician/practitioner, attest that I have discussed with the patient the benefits, risks, side effects, alternatives, likelihood of achieving goals and potential problems during recovery for the procedure that I have provided informed consent.    Order Specific Question:   Procedure    Answer:   Intra-articular shoulder joint injection under fluoroscopic guidance    Order Specific Question:   Physician/Practitioner performing the procedure    Answer:   Kordell Jafri A. Dossie Arbour, MD    Order Specific Question:   Indication/Reason    Answer:   Chronic shoulder pain secondary to shoulder arthropathy   Care order/instruction: Please confirm that the patient has stopped the Plavix (Clopidogrel) x 7-10 days prior to procedure or surgery.    Please confirm that the patient has stopped the Plavix (Clopidogrel) x 7-10 days prior to procedure or surgery.    Standing Status:   Standing    Number of Occurrences:   1   Provide equipment / supplies at bedside    "Block Tray" (Disposable  single use) Needle type: SpinalSpinal Amount/quantity: 1 Size: Regular (3.5-inch) Gauge: 22G    Standing Status:   Standing    Number  of Occurrences:   1    Order Specific Question:   Specify    Answer:   Block Tray   Bleeding precautions    Standing Status:   Standing    Number of Occurrences:   1    Chronic Opioid Analgesic:  Hydrocodone/APAP 10/325 1 tab p.o. 3 times daily (30 mg/day of hydrocodone) (30 MME) MME/day: 30 mg/day   Medications ordered for procedure: Meds ordered this encounter  Medications   lidocaine (XYLOCAINE) 2 % (with pres) injection 400 mg   pentafluoroprop-tetrafluoroeth (GEBAUERS) aerosol   ropivacaine (PF) 2 mg/mL (0.2%) (NAROPIN) injection 9 mL   methylPREDNISolone acetate (DEPO-MEDROL) injection 80 mg    Medications administered: We administered lidocaine, pentafluoroprop-tetrafluoroeth, ropivacaine (PF) 2 mg/mL (0.2%), and methylPREDNISolone acetate.  See the medical record for exact dosing, route, and time of administration.  Follow-up plan:   Return in about 2 weeks (around 10/09/2021) for Proc-day (T,Th), (VV), (PPE).       Interventional Therapies  Risk  Complexity Considerations:   Estimated body mass index is 31.19 kg/m as calculated from the following:   Height as of this encounter: 6' (1.829 m).   Weight as of this encounter: 230 lb (104.3 kg). NOTE: Plavix Anticoagulation (Stop: 7-10 days  Restart: 2 hours)    Planned  Pending:   Diagnostic bilateral lumbar facet block #1    Under consideration:   Diagnostic bilateral lumbar facet block #1    Completed:   None at this time   Therapeutic  Palliative (PRN) options:   None established      Recent Visits Date Type Provider Dept  09/15/21 Office Visit Milinda Pointer, MD [facility]  Showing recent visits within past 90 days and meeting all other requirements Today's Visits Date Type Provider Dept  09/25/21 Procedure visit Milinda Pointer, MD [facility]  Showing today's visits and meeting all other requirements Future Appointments Date Type Provider Dept  10/14/21 Appointment  Milinda Pointer, MD [facility]  Showing future appointments within next 90 days and meeting all other requirements Disposition: Discharge home  Discharge (Date  Time): 09/25/2021;   hrs.   Primary Care Physician: Ranae Plumber, PA Location: [facility] Note by: Gaspar Cola, MD Date: 09/25/2021; Time: 12:00 PM  Disclaimer:  Medicine is not an Chief Strategy Officer. The only guarantee in medicine is that nothing is guaranteed. It is important to note that the decision to proceed with this intervention was based on the information collected from the patient. The Data and conclusions were drawn from the patient's questionnaire, the interview, and the physical examination. Because the information was provided in large part by the patient, it cannot be guaranteed that it has not been purposely or unconsciously manipulated. Every effort has been made to obtain as much relevant data as possible for this evaluation. It is important to note that the conclusions that lead to this procedure are derived in large part from the available data. Always take into account that the treatment will also be dependent on availability of resources and existing treatment guidelines, considered by other Pain Management Practitioners as being common knowledge and practice, at the time of the intervention. For Medico-Legal purposes, it is also important to point out that variation in procedural techniques and pharmacological choices are the acceptable norm. The indications, contraindications, technique, and results of the above procedure should only be interpreted and judged by a Board-Certified Interventional Pain Specialist with extensive familiarity and expertise in the same exact procedure and technique.

## 2021-09-26 ENCOUNTER — Telehealth: Payer: Self-pay | Admitting: *Deleted

## 2021-09-26 NOTE — Telephone Encounter (Signed)
Post procedure call;  voicemail left for patient to call the offices if there are questions or concerns.

## 2021-10-08 ENCOUNTER — Encounter: Payer: Self-pay | Admitting: Pain Medicine

## 2021-10-09 ENCOUNTER — Ambulatory Visit: Payer: 59 | Attending: Pain Medicine | Admitting: Pain Medicine

## 2021-10-09 ENCOUNTER — Other Ambulatory Visit: Payer: Self-pay

## 2021-10-09 ENCOUNTER — Telehealth: Payer: Self-pay

## 2021-10-09 DIAGNOSIS — M47816 Spondylosis without myelopathy or radiculopathy, lumbar region: Secondary | ICD-10-CM | POA: Diagnosis not present

## 2021-10-09 DIAGNOSIS — G894 Chronic pain syndrome: Secondary | ICD-10-CM

## 2021-10-09 DIAGNOSIS — M25612 Stiffness of left shoulder, not elsewhere classified: Secondary | ICD-10-CM

## 2021-10-09 DIAGNOSIS — M545 Low back pain, unspecified: Secondary | ICD-10-CM | POA: Diagnosis not present

## 2021-10-09 DIAGNOSIS — M25512 Pain in left shoulder: Secondary | ICD-10-CM | POA: Diagnosis not present

## 2021-10-09 DIAGNOSIS — M47817 Spondylosis without myelopathy or radiculopathy, lumbosacral region: Secondary | ICD-10-CM

## 2021-10-09 DIAGNOSIS — Z7901 Long term (current) use of anticoagulants: Secondary | ICD-10-CM

## 2021-10-09 DIAGNOSIS — M19012 Primary osteoarthritis, left shoulder: Secondary | ICD-10-CM | POA: Diagnosis not present

## 2021-10-09 DIAGNOSIS — G8929 Other chronic pain: Secondary | ICD-10-CM

## 2021-10-09 NOTE — Progress Notes (Signed)
Patient: Nathan Singleton  Service Category: E/M  Provider: Gaspar Cola, MD  DOB: 1941-02-22  DOS: 10/09/2021  Location: Office  MRN: [id number]  Setting: Ambulatory outpatient  Referring Provider: Ranae Plumber, PA  Type: Established Patient  Specialty: Interventional Pain Management  PCP: Ranae Plumber, PA  Location: Remote location  Delivery: TeleHealth     Virtual Encounter - Pain Management PROVIDER NOTE: Information contained herein reflects review and annotations entered in association with encounter. Interpretation of such information and data should be left to medically-trained personnel. Information provided to patient can be located elsewhere in the medical record under "Patient Instructions". Document created using STT-dictation technology, any transcriptional errors that may result from process are unintentional.    Contact & Pharmacy Preferred: [phone] Home: [phone] (home) Mobile: [phone] (mobile) E-mail: No e-mail address on record  [place], [place] - [place] [address] [place] [address] Phone: [phone] Fax: [phone]   Pre-screening  Mr. Frederico Hamman offered "in-person" vs "virtual" encounter. He indicated preferring virtual for this encounter.   Reason COVID-19*  Social distancing based on CDC and AMA recommendations.   I contacted Durward Fortes on 10/09/2021 via telephone.      I clearly identified myself as Gaspar Cola, MD. I verified that I was speaking with the correct person using two identifiers (Name: Garald Rhew, and date of birth: 1941/07/26).  Consent I sought verbal advanced consent from Durward Fortes for virtual visit interactions. I informed Mr. Dulay of possible security and privacy concerns, risks, and limitations associated with providing "not-in-person" medical evaluation and management services. I also informed Mr. Campusano of the availability of "in-person" appointments.  Finally, I informed him that there would be a charge for the virtual visit and that he could be  personally, fully or partially, financially responsible for it. Mr. Advani expressed understanding and agreed to proceed.   Historic Elements   Mr. Jobani Sabado is a 80 y.o. year old, male patient evaluated today after our last contact on 09/25/2021. Mr. Lyness  has a past medical history of Aneurysm (arteriovenous) of coronary vessels, Arthritis, Ascending aortic aneurysm, CAD (coronary artery disease), Diabetes mellitus without complication ([place]), Hard of hearing, History of GI bleed, Hyperlipidemia associated with type 2 diabetes mellitus ([place]), Hypertension associated with diabetes ([place]), Presence of permanent cardiac pacemaker, PVD (peripheral vascular disease) ([place]), and S/P TAVR (transcatheter aortic valve replacement). 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 Mr. Scalera has a current medication list which includes the following prescription(s): acetaminophen, albuterol, allopurinol, bayer aspirin ec low dose, calcium citrate+d3 petites, carvedilol, carvedilol, vitamin d3, clopidogrel, gabapentin, insulin glargine-yfgn, isosorbide mononitrate, loperamide, multivitamin, pantoprazole, rosuvastatin, sertraline, and amlodipine.  He  reports that he has quit smoking. He has never used smokeless tobacco. He reports that he does not currently use alcohol. He reports that he does not currently use drugs. Mr. Johannes is allergic to atorvastatin, celecoxib, dicyclomine hcl, doxycycline, and metformin.   HPI  Note: The virtual visit encounter for  this patient was scheduled for 10/09/2021 but it actually had to be completed on 10/10/2021 secondary to communication issues secondary to malfunction of cellular router.  Today, he is being contacted for a post-procedure assessment.  According to the patient he has attained a 100% relief of the pain for the duration of the local anesthetic which then slowly wore off and at this point he is still enjoying an ongoing 80 to 90% relief of his left shoulder pain.  However, he refers that his low back pain is beginning to come back and he would like for that to be treated again.  We have done the first and a possible series of 2 diagnostic lumbar facet blocks.  This provided him with excellent relief of the low back pain and therefore we will plan on doing the second diagnostic injection, as time permits.  Post-Procedure Evaluation  Procedure (09/25/2021):  Procedure:           Anesthesia, Analgesia, Anxiolysis:  Type: Diagnostic Glenohumeral Joint (shoulder) Injection          Primary Purpose: Diagnostic Region: Superior Shoulder Area Level:  Shoulder Target Area: Glenohumeral Joint (shoulder) Approach: Anterior approach. Laterality: Left   Type: Local Anesthesia Local Anesthetic: Lidocaine 1-2% Sedation: None  Indication(s):  Analgesia Route: Infiltration ([place]/IM) IV Access: N/A     Position: Supine    Indications: 1. Chronic shoulder pain (3ry area of Pain) (Left)   2. Osteoarthritis of AC (acromioclavicular) joint (Left)   3. Osteoarthritis of glenohumeral joint (Left)   4. Osteoarthritis of shoulder (Left)   5. Encounter for therapeutic procedure     Pain  Score: Pre-procedure: 8 /10 Post-procedure: 4 /10     Anxiolysis: Please see nurses note.  Effectiveness during initial hour after procedure (Ultra-Short Term Relief): 100 %.  Local anesthetic used: Long-acting (4-6 hours) Effectiveness: Defined as any analgesic benefit obtained secondary to the administration of local anesthetics. This carries significant diagnostic value as to the etiological location, or anatomical origin, of the pain. Duration of benefit is expected to coincide with the duration of the local anesthetic used.  Effectiveness during initial 4-6 hours after procedure (Short-Term Relief): 100 %.  Long-term benefit: Defined as any relief past the pharmacologic duration of the local anesthetics.  Effectiveness past the initial 6 hours after procedure (Long-Term Relief): 80-90 %.  Benefits, current: Defined as benefit present at the time of this evaluation.   Analgesia: According to the patient he has attained an ongoing 80 to 90% relief of his left shoulder pain. Function: Mr. Lucarelli reports improvement in function ROM: Mr. Beale reports improvement in ROM  Pharmacotherapy Assessment   Analgesic: Hydrocodone/APAP 10/325 1 tab p.o. 3 times daily (30 mg/day of hydrocodone) (30 MME) MME/day: 30 mg/day   Monitoring:  PMP: PDMP reviewed during this encounter.       Pharmacotherapy: No side-effects or adverse reactions reported. Compliance: No problems identified. Effectiveness: Clinically acceptable. Plan: Refer to "POC". UDS:  Summary  Date Value Ref Range Status  03/03/2021 Note  Final    Comment:    ==================================================================== Compliance Drug Analysis, Ur ==================================================================== Test                             Result       Flag       Units  Drug Present   Hydrocodone  421                     ng/mg creat   Hydromorphone                  177                      ng/mg creat   Norhydrocodone                 995                     ng/mg creat    Sources of hydrocodone include scheduled prescription medications.    Hydromorphone and norhydrocodone are expected metabolites of    hydrocodone. Hydromorphone is also available as a scheduled    prescription medication.    Gabapentin                     PRESENT   Baclofen                       PRESENT   Sertraline                     PRESENT   Desmethylsertraline            PRESENT    Desmethylsertraline is an expected metabolite of sertraline.    Acetaminophen                  PRESENT   Doxylamine                     PRESENT   Dextrorphan/Levorphanol        PRESENT    Dextrorphan is an expected metabolite of dextromethorphan, an over-    the-counter or prescription cough suppressant. Levorphanol is a    scheduled prescription medication. Dextrorphan cannot be    distinguished from levorphanol by the method used for analysis.  ==================================================================== Test                      Result    Flag   Units      Ref Range   Creatinine              61               mg/dL      >=20 ==================================================================== Declared Medications:  Medication list was not provided. ==================================================================== For clinical consultation, please call [phone]. ====================================================================      Laboratory Chemistry Profile   Renal Lab Results  Component Value Date   BUN 29 (H) 07/26/2021   CREATININE 1.19 07/26/2021   BCR 31 (H) 03/03/2021   GFRAA 76 11/19/2020   GFRNONAA >60 07/26/2021    Hepatic Lab Results  Component Value Date   AST 36 06/01/2021   ALT 28 06/01/2021   ALBUMIN 3.7 06/01/2021   ALKPHOS 83 06/01/2021   LIPASE 34 03/31/2021    Electrolytes Lab Results  Component Value Date   NA 138 07/26/2021   K 4.3 07/26/2021   CL  107 07/26/2021   CALCIUM 8.5 (L) 07/26/2021   MG 1.9 05/25/2021    Bone Lab Results  Component Value Date   25OHVITD1 32 03/03/2021   25OHVITD2 1.0 03/03/2021   25OHVITD3 31 03/03/2021    Inflammation (CRP: Acute Phase) (ESR: Chronic Phase) Lab Results  Component Value Date  CRP <1 09/15/2021   ESRSEDRATE 52 (H) 09/15/2021   LATICACIDVEN 1.1 07/25/2021         Note: Above Lab results reviewed.  Imaging  DG PAIN CLINIC C-ARM 1-60 MIN NO REPORT Fluoro was used, but no Radiologist interpretation will be provided.  Please refer to "NOTES" tab for provider progress note.  Assessment  The primary encounter diagnosis was Chronic shoulder pain (3ry area of Pain) (Left). 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  Plan of Care  Problem-specific:  No problem-specific Assessment & Plan notes found for this encounter.  Mr. Rodriques Badie has a current medication list which includes the following long-term medication(s): albuterol, allopurinol, gabapentin, isosorbide mononitrate, pantoprazole, rosuvastatin, sertraline, and amlodipine.  Pharmacotherapy (Medications Ordered): No orders of the defined types were placed in this encounter.  Orders:  Orders Placed This Encounter  Procedures   LUMBAR FACET(MEDIAL BRANCH NERVE BLOCK) MBNB    Standing Status:   Future    Standing Expiration Date:   01/10/2022    Scheduling Instructions:     Procedure: Lumbar facet block (AKA.: Lumbosacral medial branch nerve  block)     Side: Bilateral     Level: L4-5, & L5-S1 Facets (L3, L4, L5, & S1 Medial Branch Nerves)     Sedation: Patient's choice.     Timeframe: ASAA    Order Specific Question:   Where will this procedure be performed?    Answer:   [facility]   Blood Thinner Instructions to Nursing    Always make sure patient has clearance from prescribing physician to stop blood thinners for interventional therapies. If the patient requires a Lovenox-bridge therapy, make sure arrangements are made to institute it with the assistance of the PCP.    Scheduling Instructions:     Have Mr. Colclough stop the Plavix (Clopidogrel) x 7-10 days prior to procedure or surgery.    Follow-up plan:   Return for [facility]) procedure: (B) L4-5, L5-S1 L-FCT Blk ##1, (NS), (Blood Thinner Protocol).      Interventional Therapies  Risk  Complexity Considerations:   Estimated body mass index is 30.79 kg/m as calculated from the following:   Height as of 09/25/21: 6' (1.829 m).   Weight as of 09/25/21: 227 lb (103 kg). NOTE: Plavix Anticoagulation (Stop: 7-10 days  Restart: 2 hours)    Planned  Pending:   Diagnostic bilateral lumbar facet MBB #1    Under consideration:   Diagnostic bilateral lumbar facet MBB #1  Diagnostic left suprascapular NB #1    Completed:   Diagnostic left IA shoulder joint injection x1    Therapeutic  Palliative (PRN) options:   Therapeutic left IA shoulder joint injection #2     Recent Visits Date Type Provider Dept  10/09/21 Office Visit Milinda Pointer, MD [facility]  09/25/21 Procedure visit Milinda Pointer, MD [facility]  09/15/21 Office Visit Milinda Pointer, MD [facility]  Showing recent visits within past 90 days and meeting all other requirements Future Appointments No visits were found meeting these conditions. Showing future appointments within next 90 days and meeting all other requirements  I discussed the assessment  and treatment plan with the patient. The patient was provided an opportunity to ask questions and all were answered. The patient agreed with the plan and demonstrated an understanding of the instructions.  Patient advised to call back or seek an in-person evaluation if the symptoms or condition worsens.  Duration of encounter: 14 minutes.  Note by: Gaspar Cola, MD Date: 10/09/2021; Time: 10:48 AM

## 2021-10-09 NOTE — Telephone Encounter (Signed)
Called patient  to inform that internet was down and MD will call asap when available. Patient states they are going to an appt 1-3pm and needed to call after that or tomorrow.

## 2021-10-10 NOTE — Patient Instructions (Signed)
____________________________________________________________________________________________  Blood Thinners  IMPORTANT NOTICE:  If you take any of these, make sure to notify the nursing staff.  Failure to do so may result in injury.  Recommended time intervals to stop and restart blood-thinners, before & after invasive procedures  Generic Name Brand Name Pre-procedure. Stop this long before procedure. Post-procedure. Minimum waiting period before restarting.  Abciximab Reopro 15 days 2 hrs  Alteplase Activase 10 days 10 days  Anagrelide Agrylin    Apixaban Eliquis 3 days 6 hrs  Cilostazol Pletal 3 days 5 hrs  Clopidogrel Plavix 7-10 days 2 hrs  Dabigatran Pradaxa 5 days 6 hrs  Dalteparin Fragmin 24 hours 4 hrs  Dipyridamole Aggrenox 11days 2 hrs  Edoxaban Lixiana; Savaysa 3 days 2 hrs  Enoxaparin  Lovenox 24 hours 4 hrs  Eptifibatide Integrillin 8 hours 2 hrs  Fondaparinux  Arixtra 72 hours 12 hrs  Hydroxychloroquine Plaquenil 11 days   Prasugrel Effient 7-10 days 6 hrs  Reteplase Retavase 10 days 10 days  Rivaroxaban Xarelto 3 days 6 hrs  Ticagrelor Brilinta 5-7 days 6 hrs  Ticlopidine Ticlid 10-14 days 2 hrs  Tinzaparin Innohep 24 hours 4 hrs  Tirofiban Aggrastat 8 hours 2 hrs  Warfarin Coumadin 5 days 2 hrs   Other medications with blood-thinning effects  Product indications Generic (Brand) names Note  Cholesterol Lipitor Stop 4 days before procedure  Blood thinner (injectable) Heparin (LMW or LMWH Heparin) Stop 24 hours before procedure  Cancer Ibrutinib (Imbruvica) Stop 7 days before procedure  Malaria/Rheumatoid Hydroxychloroquine (Plaquenil) Stop 11 days before procedure  Thrombolytics  10 days before or after procedures   Over-the-counter (OTC) Products with blood-thinning effects  Product Common names Stop Time  Aspirin > 325 mg Goody Powders, Excedrin, etc. 11 days  Aspirin ? 81 mg  7 days  Fish oil  4 days  Garlic supplements  7 days  Ginkgo biloba  36  hours  Ginseng  24 hours  NSAIDs Ibuprofen, Naprosyn, etc. 3 days  Vitamin E  4 days   ____________________________________________________________________________________________   ______________________________________________________________________  Preparing for your procedure (without sedation)  Procedure appointments are limited to planned procedures: No Prescription Refills. No disability issues will be discussed. No medication changes will be discussed.  Instructions: Oral Intake: Do not eat or drink anything for at least 6 hours prior to your procedure. (Exception: Blood Pressure Medication. See below.) Transportation: Unless otherwise stated by your physician, you may drive yourself after the procedure. Blood Pressure Medicine: Do not forget to take your blood pressure medicine with a sip of water the morning of the procedure. If your Diastolic (lower reading)is above 100 mmHg, elective cases will be cancelled/rescheduled. Blood thinners: These will need to be stopped for procedures. Notify our staff if you are taking any blood thinners. Depending on which one you take, there will be specific instructions on how and when to stop it. Diabetics on insulin: Notify the staff so that you can be scheduled 1st case in the morning. If your diabetes requires high dose insulin, take only  of your normal insulin dose the morning of the procedure and notify the staff that you have done so. Preventing infections: Shower with an antibacterial soap the morning of your procedure.  Build-up your immune system: Take 1000 mg of Vitamin C with every meal (3 times a day) the day prior to your procedure. Antibiotics: Inform the staff if you have a condition or reason that requires you to take antibiotics before dental procedures. Pregnancy: If you  are pregnant, call and cancel the procedure. Sickness: If you have a cold, fever, or any active infections, call and cancel the procedure. Arrival: You  must be in the facility at least 30 minutes prior to your scheduled procedure. Children: Do not bring any children with you. Dress appropriately: Bring dark clothing that you would not mind if they get stained. Valuables: Do not bring any jewelry or valuables.  Reasons to call and reschedule or cancel your procedure: (Following these recommendations will minimize the risk of a serious complication.) Surgeries: Avoid having procedures within 2 weeks of any surgery. (Avoid for 2 weeks before or after any surgery). Flu Shots: Avoid having procedures within 2 weeks of a flu shots or . (Avoid for 2 weeks before or after immunizations). Barium: Avoid having a procedure within 7-10 days after having had a radiological study involving the use of radiological contrast. (Myelograms, Barium swallow or enema study). Heart attacks: Avoid any elective procedures or surgeries for the initial 6 months after a "Myocardial Infarction" (Heart Attack). Blood thinners: It is imperative that you stop these medications before procedures. Let us know if you if you take any blood thinner.  Infection: Avoid procedures during or within two weeks of an infection (including chest colds or gastrointestinal problems). Symptoms associated with infections include: Localized redness, fever, chills, night sweats or profuse sweating, burning sensation when voiding, cough, congestion, stuffiness, runny nose, sore throat, diarrhea, nausea, vomiting, cold or Flu symptoms, recent or current infections. It is specially important if the infection is over the area that we intend to treat. Heart and lung problems: Symptoms that may suggest an active cardiopulmonary problem include: cough, chest pain, breathing difficulties or shortness of breath, dizziness, ankle swelling, uncontrolled high or unusually low blood pressure, and/or palpitations. If you are experiencing any of these symptoms, cancel your procedure and contact your primary care  physician for an evaluation.  Remember:  Regular Business hours are:  Monday to Thursday 8:00 AM to 4:00 PM  Provider's Schedule: Milinda Pointer, MD:  Procedure days: Tuesday and Thursday 7:30 AM to 4:00 PM  Gillis Santa, MD:  Procedure days: Monday and Wednesday 7:30 AM to 4:00 PM ______________________________________________________________________  ____________________________________________________________________________________________  General Risks and Possible Complications  Patient Responsibilities: It is important that you read this as it is part of your informed consent. It is our duty to inform you of the risks and possible complications associated with treatments offered to you. It is your responsibility as a patient to read this and to ask questions about anything that is not clear or that you believe was not covered in this document.  Patient's Rights: You have the right to refuse treatment. You also have the right to change your mind, even after initially having agreed to have the treatment done. However, under this last option, if you wait until the last second to change your mind, you may be charged for the materials used up to that point.  Introduction: Medicine is not an Chief Strategy Officer. Everything in Medicine, including the lack of treatment(s), carries the potential for danger, harm, or loss (which is by definition: Risk). In Medicine, a complication is a secondary problem, condition, or disease that can aggravate an already existing one. All treatments carry the risk of possible complications. The fact that a side effects or complications occurs, does not imply that the treatment was conducted incorrectly. It must be clearly understood that these can happen even when everything is done following the highest safety standards.  No  treatment: You can choose not to proceed with the proposed treatment alternative. The "PRO(s)" would include: avoiding the risk of  complications associated with the therapy. The "CON(s)" would include: not getting any of the treatment benefits. These benefits fall under one of three categories: diagnostic; therapeutic; and/or palliative. Diagnostic benefits include: getting information which can ultimately lead to improvement of the disease or symptom(s). Therapeutic benefits are those associated with the successful treatment of the disease. Finally, palliative benefits are those related to the decrease of the primary symptoms, without necessarily curing the condition (example: decreasing the pain from a flare-up of a chronic condition, such as incurable terminal cancer).  General Risks and Complications: These are associated to most interventional treatments. They can occur alone, or in combination. They fall under one of the following six (6) categories: no benefit or worsening of symptoms; bleeding; infection; nerve damage; allergic reactions; and/or death. No benefits or worsening of symptoms: In Medicine there are no guarantees, only probabilities. No healthcare provider can ever guarantee that a medical treatment will work, they can only state the probability that it may. Furthermore, there is always the possibility that the condition may worsen, either directly, or indirectly, as a consequence of the treatment. Bleeding: This is more common if the patient is taking a blood thinner, either prescription or over the counter (example: Goody Powders, Fish oil, Aspirin, Garlic, etc.), or if suffering a condition associated with impaired coagulation (example: Hemophilia, cirrhosis of the liver, low platelet counts, etc.). However, even if you do not have one on these, it can still happen. If you have any of these conditions, or take one of these drugs, make sure to notify your treating physician. Infection: This is more common in patients with a compromised immune system, either due to disease (example: diabetes, cancer, human  immunodeficiency virus [HIV], etc.), or due to medications or treatments (example: therapies used to treat cancer and rheumatological diseases). However, even if you do not have one on these, it can still happen. If you have any of these conditions, or take one of these drugs, make sure to notify your treating physician. Nerve Damage: This is more common when the treatment is an invasive one, but it can also happen with the use of medications, such as those used in the treatment of cancer. The damage can occur to small secondary nerves, or to large primary ones, such as those in the spinal cord and brain. This damage may be temporary or permanent and it may lead to impairments that can range from temporary numbness to permanent paralysis and/or brain death. Allergic Reactions: Any time a substance or material comes in contact with our body, there is the possibility of an allergic reaction. These can range from a mild skin rash (contact dermatitis) to a severe systemic reaction (anaphylactic reaction), which can result in death. Death: In general, any medical intervention can result in death, most of the time due to an unforeseen complication. ____________________________________________________________________________________________

## 2021-10-10 NOTE — Progress Notes (Signed)
The visit was completed the next day as I ran into communication issues secondary to malfunction of cellular router.

## 2021-10-14 ENCOUNTER — Telehealth: Payer: 59 | Admitting: Pain Medicine

## 2021-10-14 ENCOUNTER — Telehealth: Payer: Self-pay

## 2021-10-14 NOTE — Telephone Encounter (Signed)
Pre procedure instructions given to fiance because she states the patient cant hear.  Instructed to stop Plavix for 7 days, npo for 8 hours and bring a driver.  Fiance states she will relay message to patient.   Message to JM to schedule.

## 2021-10-17 ENCOUNTER — Other Ambulatory Visit: Payer: Self-pay

## 2021-10-17 ENCOUNTER — Inpatient Hospital Stay
Admission: EM | Admit: 2021-10-17 | Discharge: 2021-10-19 | DRG: 311 | Disposition: A | Discharge status: Home Health | Payer: 59 | Attending: Internal Medicine | Admitting: Internal Medicine

## 2021-10-17 ENCOUNTER — Emergency Department: Payer: 59

## 2021-10-17 ENCOUNTER — Inpatient Hospital Stay: Payer: 59

## 2021-10-17 DIAGNOSIS — I214 Non-ST elevation (NSTEMI) myocardial infarction: Secondary | ICD-10-CM

## 2021-10-17 DIAGNOSIS — B9561 Methicillin susceptible Staphylococcus aureus infection as the cause of diseases classified elsewhere: Secondary | ICD-10-CM | POA: Diagnosis present

## 2021-10-17 DIAGNOSIS — R531 Weakness: Secondary | ICD-10-CM

## 2021-10-17 DIAGNOSIS — I25118 Atherosclerotic heart disease of native coronary artery with other forms of angina pectoris: Secondary | ICD-10-CM | POA: Diagnosis not present

## 2021-10-17 DIAGNOSIS — L97509 Non-pressure chronic ulcer of other part of unspecified foot with unspecified severity: Secondary | ICD-10-CM | POA: Diagnosis present

## 2021-10-17 DIAGNOSIS — Z7902 Long term (current) use of antithrombotics/antiplatelets: Secondary | ICD-10-CM

## 2021-10-17 DIAGNOSIS — I248 Other forms of acute ischemic heart disease: Principal | ICD-10-CM | POA: Diagnosis present

## 2021-10-17 DIAGNOSIS — R778 Other specified abnormalities of plasma proteins: Secondary | ICD-10-CM | POA: Diagnosis not present

## 2021-10-17 DIAGNOSIS — E785 Hyperlipidemia, unspecified: Secondary | ICD-10-CM | POA: Diagnosis present

## 2021-10-17 DIAGNOSIS — M86671 Other chronic osteomyelitis, right ankle and foot: Secondary | ICD-10-CM | POA: Diagnosis present

## 2021-10-17 DIAGNOSIS — N1831 Chronic kidney disease, stage 3a: Secondary | ICD-10-CM | POA: Diagnosis present

## 2021-10-17 DIAGNOSIS — E1151 Type 2 diabetes mellitus with diabetic peripheral angiopathy without gangrene: Secondary | ICD-10-CM | POA: Diagnosis present

## 2021-10-17 DIAGNOSIS — Z96653 Presence of artificial knee joint, bilateral: Secondary | ICD-10-CM | POA: Diagnosis present

## 2021-10-17 DIAGNOSIS — Z794 Long term (current) use of insulin: Secondary | ICD-10-CM

## 2021-10-17 DIAGNOSIS — I350 Nonrheumatic aortic (valve) stenosis: Secondary | ICD-10-CM | POA: Diagnosis present

## 2021-10-17 DIAGNOSIS — I4510 Unspecified right bundle-branch block: Secondary | ICD-10-CM | POA: Diagnosis present

## 2021-10-17 DIAGNOSIS — I129 Hypertensive chronic kidney disease with stage 1 through stage 4 chronic kidney disease, or unspecified chronic kidney disease: Secondary | ICD-10-CM | POA: Diagnosis present

## 2021-10-17 DIAGNOSIS — E1122 Type 2 diabetes mellitus with diabetic chronic kidney disease: Secondary | ICD-10-CM | POA: Diagnosis present

## 2021-10-17 DIAGNOSIS — E11621 Type 2 diabetes mellitus with foot ulcer: Secondary | ICD-10-CM | POA: Diagnosis present

## 2021-10-17 DIAGNOSIS — D631 Anemia in chronic kidney disease: Secondary | ICD-10-CM | POA: Diagnosis present

## 2021-10-17 DIAGNOSIS — Z7982 Long term (current) use of aspirin: Secondary | ICD-10-CM

## 2021-10-17 DIAGNOSIS — I739 Peripheral vascular disease, unspecified: Secondary | ICD-10-CM | POA: Diagnosis not present

## 2021-10-17 DIAGNOSIS — R7881 Bacteremia: Secondary | ICD-10-CM | POA: Diagnosis present

## 2021-10-17 DIAGNOSIS — Z881 Allergy status to other antibiotic agents status: Secondary | ICD-10-CM

## 2021-10-17 DIAGNOSIS — Z87891 Personal history of nicotine dependence: Secondary | ICD-10-CM

## 2021-10-17 DIAGNOSIS — I495 Sick sinus syndrome: Secondary | ICD-10-CM | POA: Diagnosis present

## 2021-10-17 DIAGNOSIS — Z952 Presence of prosthetic heart valve: Secondary | ICD-10-CM

## 2021-10-17 DIAGNOSIS — F319 Bipolar disorder, unspecified: Secondary | ICD-10-CM | POA: Diagnosis present

## 2021-10-17 DIAGNOSIS — J449 Chronic obstructive pulmonary disease, unspecified: Secondary | ICD-10-CM | POA: Diagnosis present

## 2021-10-17 DIAGNOSIS — Z888 Allergy status to other drugs, medicaments and biological substances status: Secondary | ICD-10-CM

## 2021-10-17 DIAGNOSIS — Z8249 Family history of ischemic heart disease and other diseases of the circulatory system: Secondary | ICD-10-CM

## 2021-10-17 DIAGNOSIS — Z950 Presence of cardiac pacemaker: Secondary | ICD-10-CM

## 2021-10-17 DIAGNOSIS — Z79899 Other long term (current) drug therapy: Secondary | ICD-10-CM

## 2021-10-17 DIAGNOSIS — E1169 Type 2 diabetes mellitus with other specified complication: Secondary | ICD-10-CM | POA: Diagnosis present

## 2021-10-17 DIAGNOSIS — H9190 Unspecified hearing loss, unspecified ear: Secondary | ICD-10-CM | POA: Diagnosis present

## 2021-10-17 DIAGNOSIS — N1832 Chronic kidney disease, stage 3b: Secondary | ICD-10-CM | POA: Diagnosis not present

## 2021-10-17 DIAGNOSIS — D696 Thrombocytopenia, unspecified: Secondary | ICD-10-CM | POA: Diagnosis not present

## 2021-10-17 DIAGNOSIS — D61818 Other pancytopenia: Secondary | ICD-10-CM | POA: Diagnosis present

## 2021-10-17 DIAGNOSIS — Z833 Family history of diabetes mellitus: Secondary | ICD-10-CM

## 2021-10-17 DIAGNOSIS — Z8619 Personal history of other infectious and parasitic diseases: Secondary | ICD-10-CM

## 2021-10-17 DIAGNOSIS — M109 Gout, unspecified: Secondary | ICD-10-CM | POA: Diagnosis present

## 2021-10-17 DIAGNOSIS — Z20822 Contact with and (suspected) exposure to covid-19: Secondary | ICD-10-CM | POA: Diagnosis present

## 2021-10-17 DIAGNOSIS — K219 Gastro-esophageal reflux disease without esophagitis: Secondary | ICD-10-CM | POA: Diagnosis present

## 2021-10-17 DIAGNOSIS — I252 Old myocardial infarction: Secondary | ICD-10-CM | POA: Diagnosis not present

## 2021-10-17 DIAGNOSIS — Z89421 Acquired absence of other right toe(s): Secondary | ICD-10-CM

## 2021-10-17 DIAGNOSIS — Z89411 Acquired absence of right great toe: Secondary | ICD-10-CM

## 2021-10-17 DIAGNOSIS — R42 Dizziness and giddiness: Secondary | ICD-10-CM

## 2021-10-17 DIAGNOSIS — I10 Essential (primary) hypertension: Secondary | ICD-10-CM

## 2021-10-17 DIAGNOSIS — Z820 Family history of epilepsy and other diseases of the nervous system: Secondary | ICD-10-CM

## 2021-10-17 DIAGNOSIS — N179 Acute kidney failure, unspecified: Secondary | ICD-10-CM | POA: Diagnosis present

## 2021-10-17 DIAGNOSIS — I2510 Atherosclerotic heart disease of native coronary artery without angina pectoris: Secondary | ICD-10-CM | POA: Diagnosis present

## 2021-10-17 DIAGNOSIS — Z955 Presence of coronary angioplasty implant and graft: Secondary | ICD-10-CM

## 2021-10-17 DIAGNOSIS — Z951 Presence of aortocoronary bypass graft: Secondary | ICD-10-CM

## 2021-10-17 DIAGNOSIS — Z7951 Long term (current) use of inhaled steroids: Secondary | ICD-10-CM

## 2021-10-17 DIAGNOSIS — N400 Enlarged prostate without lower urinary tract symptoms: Secondary | ICD-10-CM | POA: Diagnosis present

## 2021-10-17 DIAGNOSIS — E860 Dehydration: Secondary | ICD-10-CM | POA: Diagnosis present

## 2021-10-17 LAB — HEMOGLOBIN A1C
Hgb A1c MFr Bld: 6.9 % — ABNORMAL HIGH (ref 4.8–5.6)
Mean Plasma Glucose: 151.33 mg/dL

## 2021-10-17 LAB — GLUCOSE, CAPILLARY: Glucose-Capillary: 176 mg/dL — ABNORMAL HIGH (ref 70–99)

## 2021-10-17 LAB — CBG MONITORING, ED
Glucose-Capillary: 84 mg/dL (ref 70–99)
Glucose-Capillary: 180 mg/dL — ABNORMAL HIGH (ref 70–99)
Glucose-Capillary: 163 mg/dL — ABNORMAL HIGH (ref 70–99)

## 2021-10-17 LAB — TROPONIN I (HIGH SENSITIVITY)
Troponin I (High Sensitivity): 104 ng/L (ref <18)
Troponin I (High Sensitivity): 94 ng/L — ABNORMAL HIGH
Troponin I (High Sensitivity): 89 ng/L — ABNORMAL HIGH (ref <18)
Troponin I (High Sensitivity): 81 ng/L — ABNORMAL HIGH (ref <18)

## 2021-10-17 LAB — URINALYSIS, COMPLETE (UACMP) WITH MICROSCOPIC
Bacteria, UA: NONE SEEN
Bilirubin Urine: NEGATIVE
Glucose, UA: 50 mg/dL — AB
Hgb urine dipstick: NEGATIVE
Ketones, ur: NEGATIVE mg/dL
Leukocytes,Ua: NEGATIVE
Nitrite: NEGATIVE
Protein, ur: 30 mg/dL — AB
Specific Gravity, Urine: 1.005 (ref 1.005–1.030)
Squamous Epithelial / HPF: NONE SEEN (ref 0–5)
WBC, UA: NONE SEEN WBC/hpf (ref 0–5)
pH: 5 (ref 5.0–8.0)

## 2021-10-17 LAB — CBC
HCT: 28.2 % — ABNORMAL LOW (ref 39.0–52.0)
Hemoglobin: 9.6 g/dL — ABNORMAL LOW (ref 13.0–17.0)
MCH: 31.5 pg (ref 26.0–34.0)
MCHC: 34 g/dL (ref 30.0–36.0)
MCV: 92.5 fL (ref 80.0–100.0)
Platelets: 102 10*3/uL — ABNORMAL LOW (ref 150–400)
RBC: 3.05 MIL/uL — ABNORMAL LOW (ref 4.22–5.81)
RDW: 13.6 % (ref 11.5–15.5)
WBC: 3.9 10*3/uL — ABNORMAL LOW (ref 4.0–10.5)
nRBC: 0 % (ref 0.0–0.2)

## 2021-10-17 LAB — COMPREHENSIVE METABOLIC PANEL
ALT: 44 U/L (ref 0–44)
AST: 56 U/L — ABNORMAL HIGH (ref 15–41)
Albumin: 3.5 g/dL (ref 3.5–5.0)
Alkaline Phosphatase: 81 U/L (ref 38–126)
Anion gap: 6 (ref 5–15)
BUN: 41 mg/dL — ABNORMAL HIGH (ref 8–23)
CO2: 23 mmol/L (ref 22–32)
Calcium: 8.4 mg/dL — ABNORMAL LOW (ref 8.9–10.3)
Chloride: 106 mmol/L (ref 98–111)
Creatinine, Ser: 1.57 mg/dL — ABNORMAL HIGH (ref 0.61–1.24)
GFR, Estimated: 45 mL/min — ABNORMAL LOW (ref >60)
Glucose, Bld: 277 mg/dL — ABNORMAL HIGH (ref 70–99)
Potassium: 4.2 mmol/L (ref 3.5–5.1)
Sodium: 135 mmol/L (ref 135–145)
Total Bilirubin: 0.5 mg/dL (ref 0.3–1.2)
Total Protein: 6.6 g/dL (ref 6.5–8.1)

## 2021-10-17 LAB — RESP PANEL BY RT-PCR (FLU A&B, COVID) ARPGX2
Influenza A by PCR: NEGATIVE
Influenza B by PCR: NEGATIVE
SARS Coronavirus 2 by RT PCR: NEGATIVE

## 2021-10-17 LAB — HEPARIN LEVEL (UNFRACTIONATED): Heparin Unfractionated: 0.41 IU/mL (ref 0.30–0.70)

## 2021-10-17 LAB — APTT: aPTT: 35 s (ref 24–36)

## 2021-10-17 LAB — PROTIME-INR
INR: 1.1 (ref 0.8–1.2)
Prothrombin Time: 14.1 s (ref 11.4–15.2)

## 2021-10-17 LAB — BRAIN NATRIURETIC PEPTIDE: B Natriuretic Peptide: 85.8 pg/mL (ref 0.0–100.0)

## 2021-10-17 MED ORDER — SODIUM CHLORIDE 0.9 % IV SOLN: INTRAVENOUS | Status: DC

## 2021-10-17 MED ORDER — MECLIZINE HCL 25 MG PO TABS
25.0000 mg | ORAL_TABLET | Freq: Three times a day (TID) | ORAL | Status: DC | PRN
  Filled 2021-10-17: qty 1

## 2021-10-17 MED ORDER — PANTOPRAZOLE SODIUM 40 MG PO TBEC
40.0000 mg | DELAYED_RELEASE_TABLET | Freq: Every day | ORAL | Status: DC
  Administered 2021-10-17 – 2021-10-19 (×3): 40 mg via ORAL
  Filled 2021-10-17 (×3): qty 1

## 2021-10-17 MED ORDER — LOPERAMIDE HCL 2 MG PO CAPS: 2.0000 mg | ORAL_CAPSULE | ORAL | Status: DC | PRN

## 2021-10-17 MED ORDER — SERTRALINE HCL 50 MG PO TABS
200.0000 mg | ORAL_TABLET | Freq: Every day | ORAL | Status: DC
  Administered 2021-10-17 – 2021-10-19 (×3): 200 mg via ORAL
  Filled 2021-10-17 (×3): qty 4

## 2021-10-17 MED ORDER — DM-GUAIFENESIN ER 30-600 MG PO TB12: 1.0000 | ORAL_TABLET | Freq: Two times a day (BID) | ORAL | Status: DC | PRN

## 2021-10-17 MED ORDER — CALCIUM CITRATE 950 (200 CA) MG PO TABS
1.0000 | ORAL_TABLET | Freq: Every day | ORAL | Status: DC
Start: 2021-10-17 — End: 2021-10-19
  Administered 2021-10-17 – 2021-10-19 (×3): 200 mg via ORAL
  Filled 2021-10-17 (×3): qty 1

## 2021-10-17 MED ORDER — HEPARIN SODIUM (PORCINE) 5000 UNIT/ML IJ SOLN
5000.0000 [IU] | Freq: Three times a day (TID) | INTRAMUSCULAR | Status: DC
  Administered 2021-10-17 – 2021-10-19 (×7): 5000 [IU] via SUBCUTANEOUS
  Filled 2021-10-17 (×7): qty 1

## 2021-10-17 MED ORDER — VITAMIN D 25 MCG (1000 UNIT) PO TABS
5000.0000 [IU] | ORAL_TABLET | Freq: Every day | ORAL | Status: DC
Start: 2021-10-17 — End: 2021-10-19
  Administered 2021-10-17 – 2021-10-19 (×3): 5000 [IU] via ORAL
  Filled 2021-10-17 (×3): qty 5

## 2021-10-17 MED ORDER — ADULT MULTIVITAMIN W/MINERALS CH
1.0000 | ORAL_TABLET | Freq: Every day | ORAL | Status: DC
  Administered 2021-10-17 – 2021-10-19 (×3): 1 via ORAL
  Filled 2021-10-17 (×3): qty 1

## 2021-10-17 MED ORDER — NITROGLYCERIN 0.4 MG SL SUBL: 0.4000 mg | SUBLINGUAL_TABLET | SUBLINGUAL | Status: DC | PRN

## 2021-10-17 MED ORDER — MOMETASONE FURO-FORMOTEROL FUM 100-5 MCG/ACT IN AERO
2.0000 | INHALATION_SPRAY | Freq: Two times a day (BID) | RESPIRATORY_TRACT | Status: DC
  Administered 2021-10-17 – 2021-10-18 (×4): 2 via RESPIRATORY_TRACT
  Filled 2021-10-17: qty 8.8

## 2021-10-17 MED ORDER — ASPIRIN 81 MG PO CHEW
81.0000 mg | CHEWABLE_TABLET | Freq: Every day | ORAL | Status: DC
  Administered 2021-10-18 – 2021-10-19 (×2): 81 mg via ORAL
  Filled 2021-10-17 (×2): qty 1

## 2021-10-17 MED ORDER — ACETAMINOPHEN 325 MG PO TABS
650.0000 mg | ORAL_TABLET | Freq: Four times a day (QID) | ORAL | Status: DC | PRN
  Administered 2021-10-18: 650 mg via ORAL
  Filled 2021-10-17: qty 2

## 2021-10-17 MED ORDER — ASPIRIN 81 MG PO CHEW
324.0000 mg | CHEWABLE_TABLET | Freq: Once | ORAL | Status: AC
  Administered 2021-10-17: 324 mg via ORAL
  Filled 2021-10-17: qty 4

## 2021-10-17 MED ORDER — ROSUVASTATIN CALCIUM 10 MG PO TABS
40.0000 mg | ORAL_TABLET | Freq: Every day | ORAL | Status: DC
  Administered 2021-10-17 – 2021-10-19 (×3): 40 mg via ORAL
  Filled 2021-10-17: qty 4
  Filled 2021-10-17: qty 2
  Filled 2021-10-17: qty 4
  Filled 2021-10-17: qty 2

## 2021-10-17 MED ORDER — HEPARIN BOLUS VIA INFUSION
4000.0000 [IU] | Freq: Once | INTRAVENOUS | Status: AC
  Administered 2021-10-17: 4000 [IU] via INTRAVENOUS
  Filled 2021-10-17: qty 4000

## 2021-10-17 MED ORDER — AMLODIPINE BESYLATE 10 MG PO TABS
10.0000 mg | ORAL_TABLET | Freq: Every day | ORAL | Status: DC
  Administered 2021-10-17 – 2021-10-19 (×3): 10 mg via ORAL
  Filled 2021-10-17: qty 2
  Filled 2021-10-17 (×2): qty 1

## 2021-10-17 MED ORDER — ALBUTEROL SULFATE (2.5 MG/3ML) 0.083% IN NEBU: 2.5000 mg | INHALATION_SOLUTION | RESPIRATORY_TRACT | Status: DC | PRN

## 2021-10-17 MED ORDER — INSULIN ASPART 100 UNIT/ML IJ SOLN
0.0000 [IU] | Freq: Three times a day (TID) | INTRAMUSCULAR | Status: DC
  Administered 2021-10-17 – 2021-10-18 (×3): 2 [IU] via SUBCUTANEOUS
  Administered 2021-10-18: 1 [IU] via SUBCUTANEOUS
  Administered 2021-10-18: 2 [IU] via SUBCUTANEOUS
  Administered 2021-10-19: 3 [IU] via SUBCUTANEOUS
  Administered 2021-10-19: 2 [IU] via SUBCUTANEOUS
  Filled 2021-10-17 (×7): qty 1

## 2021-10-17 MED ORDER — SODIUM CHLORIDE 0.9 % IV BOLUS (SEPSIS)
500.0000 mL | Freq: Once | INTRAVENOUS | Status: AC
  Administered 2021-10-17: 500 mL via INTRAVENOUS

## 2021-10-17 MED ORDER — GABAPENTIN 100 MG PO CAPS
100.0000 mg | ORAL_CAPSULE | Freq: Every day | ORAL | Status: DC
  Administered 2021-10-17 – 2021-10-19 (×3): 100 mg via ORAL
  Filled 2021-10-17 (×3): qty 1

## 2021-10-17 MED ORDER — ISOSORBIDE MONONITRATE ER 30 MG PO TB24
30.0000 mg | ORAL_TABLET | Freq: Every day | ORAL | Status: DC
  Administered 2021-10-17 – 2021-10-19 (×3): 30 mg via ORAL
  Filled 2021-10-17 (×3): qty 1

## 2021-10-17 MED ORDER — ONDANSETRON HCL 4 MG/2ML IJ SOLN: 4.0000 mg | Freq: Three times a day (TID) | INTRAMUSCULAR | Status: DC | PRN

## 2021-10-17 MED ORDER — INSULIN GLARGINE-YFGN 100 UNIT/ML ~~LOC~~ SOLN
12.0000 [IU] | Freq: Every day | SUBCUTANEOUS | Status: DC
  Administered 2021-10-17 – 2021-10-18 (×2): 12 [IU] via SUBCUTANEOUS
  Filled 2021-10-17 (×3): qty 0.12

## 2021-10-17 MED ORDER — SUCRALFATE 1 G PO TABS
1.0000 g | ORAL_TABLET | Freq: Four times a day (QID) | ORAL | Status: DC
  Administered 2021-10-17 – 2021-10-19 (×10): 1 g via ORAL
  Filled 2021-10-17 (×10): qty 1

## 2021-10-17 MED ORDER — INSULIN ASPART 100 UNIT/ML IJ SOLN: 0.0000 [IU] | Freq: Every day | INTRAMUSCULAR | Status: DC

## 2021-10-17 MED ORDER — CARVEDILOL 6.25 MG PO TABS
6.2500 mg | ORAL_TABLET | Freq: Two times a day (BID) | ORAL | Status: DC
  Administered 2021-10-17 – 2021-10-19 (×5): 6.25 mg via ORAL
  Filled 2021-10-17 (×5): qty 1

## 2021-10-17 MED ORDER — HYDRALAZINE HCL 20 MG/ML IJ SOLN: 5.0000 mg | INTRAMUSCULAR | Status: DC | PRN

## 2021-10-17 MED ORDER — HEPARIN (PORCINE) 25000 UT/250ML-% IV SOLN
1300.0000 [IU]/h | INTRAVENOUS | Status: DC
  Administered 2021-10-17: 1300 [IU]/h via INTRAVENOUS
  Filled 2021-10-17: qty 250

## 2021-10-17 MED ORDER — TAMSULOSIN HCL 0.4 MG PO CAPS
0.4000 mg | ORAL_CAPSULE | Freq: Every day | ORAL | Status: DC
  Administered 2021-10-17 – 2021-10-19 (×3): 0.4 mg via ORAL
  Filled 2021-10-17 (×3): qty 1

## 2021-10-17 NOTE — Progress Notes (Signed)
[place] for heparin infusion Indication: ACS/STEMI  Allergies  Allergen Reactions   Atorvastatin Other (See Comments)    "bad for kidneys" Cramping    Celecoxib Other (See Comments)    Other reaction(s): Other (See Comments) Kidney Problem Kidney Problem    Dicyclomine Hcl Other (See Comments)    Stomach cramps   Doxycycline Other (See Comments)    Per wife (per RN)- pt gets severe stomach pains even when takes with food and PCP said to not take     Metformin Other (See Comments)    Patient Measurements: Weight: 100.2 kg (221 lb) Heparin Dosing Weight: 100.2 kg  Vital Signs: Temp: 98.2 F (36.8 C) (11/04 0307) Temp Source: Oral (11/04 0307) BP: 158/69 (11/04 0410) Pulse Rate: 60 (11/04 0410)  Labs: Recent Labs    10/17/21 0317  HGB 9.6*  HCT 28.2*  PLT 102*  CREATININE 1.57*  TROPONINIHS 104*    Estimated Creatinine Clearance: 46.7 mL/min (A) (by C-G formula based on SCr of 1.57 mg/dL (H)).   Medical History: Past Medical History:  Diagnosis Date   Aneurysm (arteriovenous) of coronary vessels    Arthritis    Ascending aortic aneurysm    CAD (coronary artery disease)    Diabetes mellitus without complication (HCC)    Hard of hearing    History of GI bleed    Hyperlipidemia associated with type 2 diabetes mellitus (HCC)    Hypertension associated with diabetes ([place])    Presence of permanent cardiac pacemaker    PVD (peripheral vascular disease) ([place])    S/P TAVR (transcatheter aortic valve replacement)     Assessment: Pt is 80 yo male with hx of CVD, presenting to ED due to SOB, dizziness, and generalized weakness found with elevated Troponin I.  Goal of Therapy:  Heparin level 0.3-0.7 units/ml Monitor platelets by anticoagulation protocol: Yes   Plan:  Bolus 4000 units x 1 Start heparin infusion at 1300 units/hr Check HL in 8 hrs after start of infusion CBC daily while on heparin.  Renda Rolls,  PharmD, [facility] 10/17/2021 4:52 AM

## 2021-10-17 NOTE — ED Triage Notes (Signed)
Pt states he woke up tonight states feeling weak. Also had some shob, no pain. Pt states he has had recent cold symptoms but was dx with pcp with respiratory virus. No distress noted in triage. Pt VERY hard of hearing.

## 2021-10-17 NOTE — ED Notes (Signed)
This RN spoke with pharmacy to confirm stopping heparin drip and starting SQ heparin

## 2021-10-17 NOTE — ED Notes (Signed)
RN to bedside to answer call light. Pt reports he feels "funny". States ears ringing and feels weak. Repeat eKG performed. MD messaged.  Denies cp or shob. NAD noted

## 2021-10-17 NOTE — Consult Note (Signed)
Cardiology Consultation:   Patient ID: Nathan Singleton; [id number]; [birth year]   Admit date: 10/17/2021 Date of Consult: 10/17/2021  Primary Care Provider: Ranae Plumber, [place] Primary Cardiologist: Agustin Cree - formerly [facility]  Primary Electrophysiologist:  None   Patient Profile:   Nathan Singleton is a 80 y.o. 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 Blaine Hamper.  History of Present Illness:   Nathan Singleton was previously followed by [facility] in  [place], though has more recently established with Dr. Joycelyn Rua upon relocating to [facility]. Most recent cardiac cath on 12/12/2019 showed stable, mild nonobstructive disease and patent previously placed proximal RCA stent with mild in-stent restenosis.  Nuclear stress test on 06/15/2020 showed no perfusion defect with an EF of 63%.   He has been seen in the ER several times due to mechanical falls.   He was admitted in 02/2021 with a diabetic foot ulcer with bone scan concerning for osteomyelitis of the great toe.  He underwent I&D and biopsy which showed no evidence of osteomyelitis.  He was treated with antibiotics.  He was evaluated by vascular surgery and underwent angiogram and angioplasty of the SFA and percutaneous transluminal angioplasty of the right dorsalis podalic artery and distal anterior tibial artery. He was started on aspirin and Plavix.  Note indicates his pacer was interrogated with no events  noted.  He was readmitted in 03/2021 with cellulitis of the right great toe ultimately requiring amputation due to gangrenous changes.  Hospital admission was complicated by MSSA bacteremia. 2D surface echo on 4/11 showed an EF greater than 55%, indeterminate LV diastolic function parameters, normal RV systolic function and ventricular cavity size, mildly dilated left atrium, mild mitral regurgitation, mild aortic valve stenosis with a mean gradient of 16 mmHg, and an estimated right atrial pressure of 15 mmHg.  There was no definite evidence of endocarditis.  He subsequently underwent TEE that admission which showed a preserved LV systolic function with no evidence of vegetation or infective endocarditis.  He was admitted in 04/2021 with cellulitis involving the second to with negative blood cultures along with syncope felt to be related to orthostatic hypotension.  He was admitted in 05/2021 with continued orthostatic hypotension and right second toe wound.  He was admitted a second time in 05/2021 with worsening chronic right second toe wound with blood cultures again showing MSSA bacteremia.  He underwent repeat TEE which showed no vegetations.  However, there was a small 5 x 3 mm calcified/fibrinous structure in the right atrium attached to the pacemaker lead that was noted on echo in 03/2021 and felt to likely represent prior calcified vegetation versus thrombus.  He underwent amputation of the right second toe.  He was admitted in 07/2021 with osteomyelitis involving the third toe on the right foot and underwent successful amputation.  He presented to the ED on 10/17/2021 with complaints of weakness.  No chest pain, dyspnea, palpitations, dizziness, presyncope, or syncope.  High-sensitivity troponin minimally elevated at 104 with  a delta troponin of 94.  BNP 85.  Renal function consistent with mild dehydration with a BUN of 41 and a serum creatinine 1.57 with baseline serum creatinine around 1.1-1.2.  Hemoglobin  low though stable at 9.6.  EKG showed NSR, 63 bpm, RBBB, baseline artifact.  Chest x-ray showed no acute cardiopulmonary process.  CT head was nonacute.  Currently, he is without cardiac complaint.  He does continue to note generalized fatigue and weakness.   Past Medical History:  Diagnosis Date   Aneurysm (arteriovenous) of coronary vessels    Arthritis    Ascending aortic aneurysm    CAD (coronary artery disease)    Diabetes mellitus without complication ([place])    Hard of hearing    History of GI bleed    Hyperlipidemia associated with type 2 diabetes mellitus ([place])    Hypertension associated with diabetes ([place])    Presence of permanent cardiac pacemaker    PVD (peripheral vascular disease) ([place])    S/P TAVR (transcatheter aortic valve replacement)     Past Surgical History:  Procedure Laterality Date   AMPUTATION TOE Right 03/20/2021   Procedure: AMPUTATION TOE;  Surgeon: Sharlotte Alamo, DPM;  Location: [facility] ORS;  Service: Podiatry;  Laterality: Right;   AMPUTATION TOE Right 06/03/2021   Procedure: AMPUTATION TOE-SECOND TOE;  Surgeon: [name], DPM;  Location: [facility] ORS;  Service: Podiatry;  Laterality: Right;   AMPUTATION TOE Right 07/26/2021   Procedure: AMPUTATION TOE-THIRD TOE;  Surgeon: [name], DPM;  Location: [facility] ORS;  Service: Podiatry;  Laterality: Right;   Ankle repaired Right    Carpal Tunnel repaired Left    CATARACT EXTRACTION Bilateral    COLONOSCOPY     Copillar implant      DG CHOLECYSTOGRAPHY GALL BLADDER ([facility] HX)     GASTRIC BYPASS     heart valve replaced     IRRIGATION AND DEBRIDEMENT FOOT Right 02/18/2021   Procedure: IRRIGATION AND DEBRIDEMENT FOOT-Right Great Toe;  Surgeon: [name], DPM;  Location: [facility] ORS;  Service: Podiatry;  Laterality: Right;   LOWER EXTREMITY ANGIOGRAPHY Right 02/17/2021   Procedure: Lower Extremity Angiography;  Surgeon: Algernon Huxley, MD;  Location: [place] [facility];  Service: Cardiovascular;  Laterality: Right;    LOWER EXTREMITY ANGIOGRAPHY Right 03/27/2021   Procedure: Lower Extremity Angiography;  Surgeon: Algernon Huxley, MD;  Location: [place] CV LAB;  Service: Cardiovascular;  Laterality: Right;   PACEMAKER GENERATOR CHANGE     REPLACEMENT TOTAL KNEE BILATERAL     RTC     STENT PLACE LEFT URETER ([place] HX) Right    Right Leg   TEE WITHOUT CARDIOVERSION N/A 03/26/2021   Procedure: TRANSESOPHAGEAL ECHOCARDIOGRAM (TEE);  Surgeon: Kate Sable, MD;  Location: [facility] ORS;  Service: Cardiovascular;  Laterality: N/A;   TEE WITHOUT CARDIOVERSION N/A 06/04/2021   Procedure: TRANSESOPHAGEAL ECHOCARDIOGRAM (TEE);  Surgeon: Kate Sable, MD;  Location: [facility] ORS;  Service: Cardiovascular;  Laterality: N/A;   Toe nail removed Bilateral      Home Meds: Prior to Admission medications   Medication Sig Start Date End Date Taking? Authorizing Provider  acetaminophen (TYLENOL) 325 MG tablet Take 2 tablets (650 mg total) by mouth every 6 (six) hours as needed for fever, headache or moderate pain. 05/09/21  Yes Loletha Grayer, MD  albuterol (VENTOLIN HFA) 108 (90 Base) MCG/ACT inhaler Inhale 2 puffs into the lungs every 4 (four) hours as needed for wheezing or shortness of breath. 05/09/21  Yes Loletha Grayer, MD  allopurinol (  ZYLOPRIM) 100 MG tablet Take 100 mg by mouth daily. 10/19/20  Yes [provider]  amLODipine (NORVASC) 10 MG tablet Take 1 tablet (10 mg total) by mouth daily. 05/28/21 10/17/21 Yes Nolberto Hanlon, MD  BAYER ASPIRIN EC LOW DOSE 81 MG EC tablet Take 81 mg by mouth daily. 11/11/20  Yes [provider]  CALCIUM CITRATE+D3 PETITES 200-250 MG-UNIT TABS Take by mouth. 07/10/21  Yes [provider]  carvedilol (COREG) 6.25 MG tablet Take 6.25 mg by mouth 2 (two) times daily. 07/08/21  Yes [provider]  Cholecalciferol (VITAMIN D3) 125 MCG (5000 UT) CAPS Take 5,000 Units by mouth daily. 05/09/21  Yes [provider]  gabapentin (NEURONTIN) 100 MG  capsule Take 100 mg by mouth daily. 01/29/21  Yes [provider]  Insulin Glargine-yfgn 100 UNIT/ML SOPN Inject 18 Units into the skin at bedtime. 05/09/21  Yes [provider]  isosorbide mononitrate (IMDUR) 30 MG 24 hr tablet Take 30 mg by mouth daily. 02/12/21  Yes [provider]  lactulose, encephalopathy, (CHRONULAC) 10 GM/15ML SOLN Take 15 mLs by mouth 2 (two) times daily. 10/08/21  Yes [provider]  loperamide (IMODIUM) 2 MG capsule Take 1 capsule (2 mg total) by mouth as needed for diarrhea or loose stools. 03/31/21  Yes Blake Divine, MD  [facility] 17 GM/SCOOP powder Take by mouth. 10/08/21  Yes [provider]  multivitamin (ONE-A-DAY MEN'S) TABS tablet Take 1 tablet by mouth daily. 11/11/20  Yes [provider]  pantoprazole (PROTONIX) 40 MG tablet Take 1 tablet (40 mg total) by mouth daily. 02/11/21  Yes [name], MD  rosuvastatin (CRESTOR) 40 MG tablet Take 40 mg by mouth daily. 02/13/21  Yes [provider]  sertraline (ZOLOFT) 100 MG tablet Take 200 mg by mouth daily. 11/11/20  Yes [provider]  sucralfate (CARAFATE) 1 g tablet Take 1 g by mouth 4 (four) times daily. 10/08/21  Yes [provider]  SYMBICORT 80-4.5 MCG/ACT inhaler Inhale 2 puffs into the lungs 2 (two) times daily. 09/08/21  Yes [provider]  tamsulosin (FLOMAX) 0.4 MG CAPS capsule Take 0.4 mg by mouth daily. 10/07/21  Yes [provider]  VICTOZA 18 MG/3ML SOPN Inject 1.8 mg into the skin daily. 10/07/21  Yes [provider]  clopidogrel (PLAVIX) 75 MG tablet Take 1 tablet (75 mg total) by mouth daily. Patient not taking: No sig reported 02/22/21   Charlynne Cousins, MD    Inpatient Medications: Scheduled Meds:  amLODipine  10 mg Oral Daily   [START ON 10/18/2021] aspirin  81 mg Oral Daily   calcium citrate  1 tablet Oral Daily   carvedilol  6.25 mg Oral BID   cholecalciferol  5,000 Units Oral Daily    gabapentin  100 mg Oral Daily   heparin  5,000 Units Subcutaneous Q8H   insulin aspart  0-5 Units Subcutaneous QHS   insulin aspart  0-9 Units Subcutaneous TID WC   insulin glargine-yfgn  12 Units Subcutaneous QHS   isosorbide mononitrate  30 mg Oral Daily   mometasone-formoterol  2 puff Inhalation BID   multivitamin with minerals  1 tablet Oral Daily   pantoprazole  40 mg Oral Daily   rosuvastatin  40 mg Oral Daily   sertraline  200 mg Oral Daily   sucralfate  1 g Oral QID   tamsulosin  0.4 mg Oral Daily   Continuous Infusions:  sodium chloride 75 mL/hr at 10/17/21 0809   PRN Meds:  acetaminophen, albuterol, dextromethorphan-guaiFENesin, hydrALAZINE, loperamide, meclizine, nitroGLYCERIN, ondansetron (ZOFRAN) IV  Allergies:   Allergies  Allergen Reactions   Atorvastatin Other (See Comments)    "bad for kidneys" Cramping    Celecoxib Other (See Comments)    Other reaction(s): Other (See Comments) Kidney Problem Kidney Problem    Dicyclomine Hcl Other (See Comments)    Stomach cramps   Doxycycline Other (See Comments)    Per wife (per RN)- pt gets severe stomach pains even when takes with food and PCP said to not take     Metformin Other (See Comments)    Social History:   Social History   Socioeconomic History   Marital status: Single    Spouse name: Not on file   Number of children: Not on file   Years of education: Not on file   Highest education level: Not on file  Occupational History   Not on file  Tobacco Use   Smoking status: Former   Smokeless tobacco: Never  Substance and Sexual Activity   Alcohol use: Not Currently   Drug use: Not Currently   Sexual activity: Not on file  Other Topics Concern   Not on file  Social History Narrative   Not on file   Social Determinants of Health   Financial Resource Strain: Not on file  Food Insecurity: Not on file  Transportation Needs: Not on file  Physical Activity: Not on file  Stress: Not on file   Social Connections: Not on file  Intimate Partner Violence: Not on file     Family History:   Family History  Problem Relation Age of Onset   Heart disease Mother    Alzheimer's disease Mother    Heart disease Father    Diabetes Father     ROS:  Review of Systems  Constitutional:  Positive for malaise/fatigue. Negative for chills, diaphoresis, fever and weight loss.  HENT:  Negative for congestion.   Eyes:  Negative for discharge and redness.  Respiratory:  Negative for cough, sputum production, shortness of breath and wheezing.   Cardiovascular:  Negative for chest pain, palpitations, orthopnea, claudication, leg swelling and PND.  Gastrointestinal:  Negative for abdominal pain, blood in stool, heartburn, melena, nausea and vomiting.  Musculoskeletal:  Negative for falls and myalgias.  Skin:  Negative for rash.  Neurological:  Positive for weakness. Negative for dizziness, tingling, tremors, sensory change, speech change, focal weakness and loss of consciousness.  Endo/Heme/Allergies:  Does not bruise/bleed easily.  Psychiatric/Behavioral:  Negative for substance abuse. The patient is not nervous/anxious.   All other systems reviewed and are negative.    Physical Exam/Data:   Vitals:   10/17/21 0630 10/17/21 0828 10/17/21 1030 10/17/21 1100  BP: (!) 184/79 138/69 (!) 173/97 (!) 155/85  Pulse: 61 (!) 58 64 60  Resp: 16 10 16 15   Temp:      TempSrc:      SpO2: 100% 99% 99% 99%  Weight:        Intake/Output Summary (Last 24 hours) at 10/17/2021 1357 Last data filed at 10/17/2021 1117 Gross per 24 hour  Intake 500 ml  Output 1550 ml  Net -1050 ml   Filed Weights   10/17/21 0308  Weight: 100.2 kg   Body mass index is 29.97 kg/m.   Physical Exam: General: Well developed, well nourished, in no acute distress. Head: Normocephalic, atraumatic, sclera non-icteric, no xanthomas, nares without discharge.  Neck: Negative for carotid bruits. JVD not elevated. Lungs:  Clear bilaterally to  auscultation without wheezes, rales, or rhonchi. Breathing is unlabored. Heart: RRR with S1 S2. II/VI systolic murmur, no rubs, or gallops appreciated. Abdomen: Soft, non-tender, non-distended with normoactive bowel sounds. No hepatomegaly. No rebound/guarding. No obvious abdominal masses. Msk:  Strength and tone appear normal for age. Extremities: No clubbing or cyanosis. No edema. Distal pedal pulses are 2+ and equal bilaterally. Neuro: Alert and oriented X 3. No facial asymmetry. No focal deficit. Moves all extremities spontaneously. Psych:  Responds to questions appropriately with a normal affect.   EKG:  The EKG was personally reviewed and demonstrates: NSR, 63 bpm, RBBB, baseline artifact Telemetry:  Telemetry was personally reviewed and demonstrates: SR  Weights: [employer]   10/17/21 0308  Weight: 100.2 kg    Relevant CV Studies:  TEE 05/2021: 1. Left ventricular ejection fraction, by estimation, is 60%. The left  ventricle has normal function.   2. Right ventricular systolic function is normal. The right ventricular  size is normal.   3. Left atrial size was mildly dilated. No left atrial/left atrial  appendage thrombus was detected.   4. The mitral valve is normal in structure. Mild mitral valve  regurgitation.   5. The aortic valve has been repaired/replaced. Aortic valve  regurgitation is not visualized. Echo findings are consistent with normal  structure and function of the aortic valve prosthesis.   Conclusion(s)/Recommendation(s): There is a small 5 x 3 mm  calcified/fibrinous structure in the right atrium (image clip 31),  attached to the pacemaker lead. This was present on prior TEecho 2 months  ago. likely represents prior calcified vegetation vs  thrombus. no acute vegetation or endocarditis noted on this TEE. __________  TEE 03/2021: 1. Left ventricular ejection fraction, by estimation, is 55 to 60%. The  left ventricle has normal  function.   2. Right ventricular systolic function is normal. The right ventricular  size is normal.   3. Left atrial size was mildly dilated. No left atrial/left atrial  appendage thrombus was detected.   4. The mitral valve is normal in structure. Mild mitral valve  regurgitation.   5. The aortic valve has been repaired/replaced. Aortic valve  regurgitation is not visualized. There is a Sapien prosthetic (TAVR) valve  present in the aortic position. Echo findings are consistent with normal  structure and function of the aortic valve  prosthesis.   Conclusion(s)/Recommendation(s): No evidence of vegetation/infective  endocarditis on this transesophageal  echocardiogram.  __________  2D echo 03/2021: 1. Left ventricular ejection fraction, by estimation, is >55%. The left  ventricle has normal function. Left ventricular endocardial border not  optimally defined to evaluate regional wall motion. Left ventricular  diastolic parameters are indeterminate.   2. Right ventricular systolic function is normal. The right ventricular  size is normal.   3. Left atrial size was mildly dilated.   4. The mitral valve was not well visualized. Mild mitral valve  regurgitation. No evidence of mitral stenosis.   5. The aortic valve was not well visualized. Aortic valve regurgitation  is not visualized. Mild aortic valve stenosis. Aortic valve mean gradient  measures 16.0 mmHg.   6. The inferior vena cava is dilated in size with <50% respiratory  variability, suggesting right atrial pressure of 15 mmHg.   Conclusion(s)/Recommendation(s): No definite evidence of endocarditis.  However, acoustic windows are suboptimal and transesophageal  echocardiogram should be considered in the setting of implanted cardiac  devices and MSSA bacteremia.     Laboratory Data:  Chemistry Recent Labs  Lab 10/17/21  0317  NA 135  K 4.2  CL 106  CO2 23  GLUCOSE 277*  BUN 41*  CREATININE 1.57*  CALCIUM 8.4*   GFRNONAA 45*  ANIONGAP 6    Recent Labs  Lab 10/17/21 0317  PROT 6.6  ALBUMIN 3.5  AST 56*  ALT 44  ALKPHOS 81  BILITOT 0.5   Hematology Recent Labs  Lab 10/17/21 0317  WBC 3.9*  RBC 3.05*  HGB 9.6*  HCT 28.2*  MCV 92.5  MCH 31.5  MCHC 34.0  RDW 13.6  PLT 102*   Cardiac EnzymesNo results for input(s): TROPONINI in the last 168 hours. No results for input(s): TROPIPOC in the last 168 hours.  BNP Recent Labs  Lab 10/17/21 0726  BNP 85.8    DDimer No results for input(s): DDIMER in the last 168 hours.  Radiology/Studies:  DG Chest 2 View  Result Date: 10/17/2021 IMPRESSION: Normal chest radiographs. Electronically Signed   By: Julian Hy M.D.   On: 10/17/2021 03:38   CT HEAD WO CONTRAST (5MM)  Result Date: 10/17/2021 IMPRESSION: 1. No acute intracranial abnormalities. 2. Chronic small vessel ischemic disease and brain atrophy. Electronically Signed   By: Kerby Moors M.D.   On: 10/17/2021 11:43    Assessment and Plan:   1.  CAD involving the native coronary arteries with elevated high-sensitivity troponin: -Mild elevation in high-sensitivity troponin peaking at 104, currently downtrending -Not consistent with ACS or NSTEMI -Likely supply demand ischemia in the setting of known CAD with elevated blood pressure, renal dysfunction, and dehydration -No indication for heparin drip, will discontinue (discussed with cardiology MD and IM) -No plans for inpatient ischemic evaluation at this time -Continue aspirin, amlodipine, carvedilol, Imdur, and Crestor  2.  Aortic valve stenosis status post TAVR: -Normal structure and function of aortic valve replacement in 05/2021 -SBE prophylaxis in the outpatient setting  3.  Sick sinus syndrome status post dual-chamber PPM: -Device appears to be functioning normally -He was previously admitted with MSSA bacteremia in 03/2021 and again in 05/2021, given his generalized complaints of weakness, it may be beneficial to  repeat blood cultures, see below  4.  HTN: -Blood pressure elevated in the ED -If needed, can titrate carvedilol to 12.5 mg twice daily, otherwise continue current therapy  5.  Acute on CKD stage III: -Possibly in setting of dehydration -Gentle hydration recommended  6.  Generalized weakness: -Afebrile, though it may be beneficial to obtain a blood cultures given pacemaker implantation with prior history of recurrent MSSA bacteremia in the spring and summer of 2022 -TEE in 05/2021 noted possible prior calcified vegetation attached to the right atrial pacemaker lead without acute vegetation or endocarditis -Case has been discussed with primary service, and they will order blood cultures    For questions or updates, please contact [place] Please consult [email or web address] for contact info under Cardiology/STEMI.   Signed, Christell Faith, PA-C [place] Pager: [phone] 10/17/2021, 1:57 PM

## 2021-10-17 NOTE — ED Triage Notes (Signed)
EMS brings pt in from home for c/o "feeling funny"

## 2021-10-17 NOTE — ED Provider Notes (Signed)
[facility] Emergency Department Provider Note  ____________________________________________   Event Date/Time   First MD Initiated Contact with Patient 10/17/21 [phone]     (approximate)  I have reviewed the triage vital signs and the nursing notes.   HISTORY  Chief Complaint Weakness    HPI Nathan Singleton is a 80 y.o. male with history of CAD, hypertension, diabetes, hyperlipidemia, aortic valve replacement, sick sinus rhythm status post pacemaker who presents to the emergency department with complaints of shortness of breath, dizziness and generalized weakness that woke him from sleep.  No chest pain or chest discomfort.  Reports symptoms have now resolved.  No fevers, cough, nausea, vomiting, diarrhea.  When he has had a previous heart attack his family reports that he never had any chest pain.  He has a previous stent and is on Plavix but family reports he stopped this medication yesterday due to having a procedure scheduled for his back on November 11.        Past Medical History:  Diagnosis Date   Aneurysm (arteriovenous) of coronary vessels    Arthritis    Ascending aortic aneurysm    CAD (coronary artery disease)    Diabetes mellitus without complication (HCC)    Hard of hearing    History of GI bleed    Hyperlipidemia associated with type 2 diabetes mellitus ([place])    Hypertension associated with diabetes ([place])    Presence of permanent cardiac pacemaker    PVD (peripheral vascular disease) ([place])    S/P TAVR (transcatheter aortic valve replacement)     Patient Active Problem List   Diagnosis Date Noted   Osteoarthritis of shoulder (Left) 09/15/2021   Osteoarthritis of AC (acromioclavicular) joint (Left) 09/15/2021   Osteoarthritis of glenohumeral joint (Left) 09/15/2021   Limited internal rotation of glenohumeral joint of shoulder (Left) 09/15/2021   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 ([place]) 52/77/8242    Arthritis 08/19/2021   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   History of GI bleed 08/19/2021   MSSA bacteremia 08/19/2021   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[place]) 08/19/2021   Orthostatic hypotension 08/19/2021   Weakness 08/19/2021   HTN (hypertension) 06/27/2021   AKI (acute kidney injury) ([place]) 06/27/2021   AMS (altered mental status) 06/27/2021   CKD stage G3a/A3, GFR 45-59 and albumin creatinine ratio >300 mg/g ([place]) 06/27/2021   Peripheral vascular disease ([place]) 06/25/2021   Edema, unspecified 06/25/2021   Proteinuria, unspecified 06/25/2021   Type 2 diabetes mellitus with diabetic chronic kidney disease ([place]) 06/25/2021   Anemia in chronic kidney disease 06/25/2021   Bacteremia    Osteomyelitis ([place]) 06/01/2021   Syncope and collapse 05/25/2021   Spondylosis without myelopathy or radiculopathy, lumbosacral region 05/12/2021   CAP (community acquired pneumonia) 05/06/2021   Syncope, vasovagal 05/06/2021   CKD (chronic kidney disease), stage IIIa 05/06/2021   Diarrhea 05/06/2021   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[place]) 05/06/2021   Osteoarthritis of facet joint at L5-S1 level of lumbosacral spine 04/22/2021   Lumbar facet joint syndrome (Bilateral) (L>R) 04/22/2021   Anemia of chronic disease    Cellulitis of second toe of right foot 03/20/2021   Type 2 diabetes mellitus with hyperlipidemia ([place]) 03/20/2021   Chronic kidney disease, stage 3a ([place]) 03/20/2021   Dizziness 03/20/2021   Chronic pain syndrome 03/03/2021   Pharmacologic therapy 03/03/2021   Disorder of skeletal system 03/03/2021   Problems influencing health status 35/36/1443   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[place]) 03/03/2021   Chronic  lower extremity pain (2ry area of Pain) (Bilateral) (R>L) 03/03/2021   Chronic anticoagulation (Plavix) 03/03/2021   Chronic shoulder pain (3ry area of Pain) (Left) 03/03/2021   History of total knee replacement (Bilateral) 03/03/2021   DDD (degenerative disc disease),  thoracic 03/03/2021   Cervical facet hypertrophy (Multilevel) (Bilateral) 03/03/2021   DDD (degenerative disc disease), cervical 03/03/2021   BPH (benign prostatic hyperplasia) 02/14/2021   Diabetic foot ulcer ([place]) 02/13/2021   Hyperlipidemia associated with type 2 diabetes mellitus ([place]) 11/26/2020   Normocytic anemia 11/26/2020   Chronic kidney disease (CKD) 11/26/2020   Claudication ([place]) 11/26/2020   Contusion of right thigh 11/26/2020   COPD (chronic obstructive pulmonary disease) ([place]) 11/26/2020   Dyspnea 11/26/2020   Erectile dysfunction 11/26/2020   GERD without esophagitis 11/26/2020   Gout 11/26/2020   Hyperlipidemia, acquired 11/26/2020   Hyperlipidemia 11/26/2020   Myocardial infarction ([place]) 11/26/2020   Personal history of other diseases of urinary system 11/26/2020   Right leg swelling 11/26/2020   Sleep apnea 11/26/2020   Varicose veins 11/26/2020   Venous insufficiency 11/26/2020   Vitamin D deficiency 11/26/2020   Chest pain 11/21/2020   Thrombocytopenia ([place]) 11/21/2020   Presence of permanent cardiac pacemaker    Hypertension associated with diabetes [facility])    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 11/19/2020   Pacemaker St Jude device 11/19/2020   Peripheral vascular disease, unspecified ([place]) stents to popliteal arteries many years ago 11/19/2020   History of gingival bleeding 11/19/2020   Coronary artery disease involving coronary bypass graft of native heart with angina pectoris ([place]) 11/19/2020   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 11/19/2020   DM2 (diabetes mellitus, type 2) ([place])    Closed nondisplaced fracture of greater trochanter of right femur ([place]) 01/25/2020   DDD (degenerative disc disease), lumbar 12/10/2019   Chronic low back pain (1ry area of Pain) (Bilateral) (L>R) w/o sciatica 12/09/2019   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   Urinary retention 12/09/2019   Obesity  04/07/2019   Retinopathy 04/07/2019   Deafness, left 01/12/2019   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/12/2019   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   Bilateral tinnitus 08/24/2018   Disequilibrium 08/24/2018   Hypertrophy of nasal turbinates 08/24/2018   Hypertrophy of tonsils 08/24/2018   Obstructive sleep apnea of adult 05/30/2018   Presence of prosthetic heart valve 05/30/2018   Heart block 04/25/2018   Calculus of gallbladder with acute cholecystitis without obstruction 09/21/2017   Right bundle branch block 12/25/2016   Gastrojejunal ulcer 03/23/2014   Jejunal ulcer 03/23/2014   Insomnia 03/22/2014   Blisters of multiple sites 01/18/2014   Cellulitis 01/18/2014   OA (osteoarthritis) of knee 06/09/2013   Status post percutaneous transluminal coronary angioplasty 01/14/2011   Coronary arteriosclerosis 01/29/2009   Bipolar disorder ([place]) 01/29/2009   Depression 01/29/2009   Hearing loss 01/29/2009    Past Surgical History:  Procedure Laterality Date   AMPUTATION TOE Right 03/20/2021   Procedure: AMPUTATION TOE;  Surgeon: Sharlotte Alamo, DPM;  Location: [facility] ORS;  Service: Podiatry;  Laterality: Right;   AMPUTATION TOE Right 06/03/2021   Procedure: AMPUTATION TOE-SECOND TOE;  Surgeon: [name], DPM;  Location: [facility] ORS;  Service: Podiatry;  Laterality: Right;   AMPUTATION TOE Right 07/26/2021   Procedure: AMPUTATION TOE-THIRD TOE;  Surgeon: [name], DPM;  Location: [facility] ORS;  Service: Podiatry;  Laterality: Right;   Ankle repaired Right    Carpal Tunnel repaired Left  CATARACT EXTRACTION Bilateral    COLONOSCOPY     Copillar implant      DG CHOLECYSTOGRAPHY GALL BLADDER ([facility] HX)     GASTRIC BYPASS     heart valve replaced     IRRIGATION AND DEBRIDEMENT FOOT Right 02/18/2021   Procedure: IRRIGATION AND DEBRIDEMENT FOOT-Right Great Toe;  Surgeon: [name], DPM;  Location: [facility] ORS;  Service: Podiatry;  Laterality: Right;   LOWER EXTREMITY ANGIOGRAPHY Right  02/17/2021   Procedure: Lower Extremity Angiography;  Surgeon: Algernon Huxley, MD;  Location: [place] [facility];  Service: Cardiovascular;  Laterality: Right;   LOWER EXTREMITY ANGIOGRAPHY Right 03/27/2021   Procedure: Lower Extremity Angiography;  Surgeon: Algernon Huxley, MD;  Location: [place] [facility];  Service: Cardiovascular;  Laterality: Right;   PACEMAKER GENERATOR CHANGE     REPLACEMENT TOTAL KNEE BILATERAL     RTC     STENT PLACE LEFT URETER ([place] HX) Right    Right Leg   TEE WITHOUT CARDIOVERSION N/A 03/26/2021   Procedure: TRANSESOPHAGEAL ECHOCARDIOGRAM (TEE);  Surgeon: Kate Sable, MD;  Location: [facility];  Service: Cardiovascular;  Laterality: N/A;   TEE WITHOUT CARDIOVERSION N/A 06/04/2021   Procedure: TRANSESOPHAGEAL ECHOCARDIOGRAM (TEE);  Surgeon: Kate Sable, MD;  Location: [facility] OR[facility];  Service: Cardiovascular;  Laterality: N/A;   Toe nail removed Bilateral     Prior to Admission medications   Medication Sig Start Date End Date Taking? Authorizing Provider  acetaminophen (TYLENOL) 325 MG tablet Take 2 tablets (650 mg total) by mouth every 6 (six) hours as needed for fever, headache or moderate pain. 05/09/21   Loletha Grayer, MD  albuterol (VENTOLIN HFA) 108 (90 Base) MCG/ACT inhaler Inhale 2 puffs into the lungs every 4 (four) hours as needed for wheezing or shortness of breath. 05/09/21   Loletha Grayer, MD  allopurinol (ZYLOPRIM) 100 MG tablet Take 100 mg by mouth daily. 10/19/20   [provider]  amLODipine (NORVASC) 10 MG tablet Take 1 tablet (10 mg total) by mouth daily. 05/28/21 06/27/21  Nolberto Hanlon, MD  BAYER ASPIRIN EC LOW DOSE 81 MG EC tablet Take 81 mg by mouth daily. 11/11/20   [provider]  CALCIUM CITRATE+D3 PETITES 200-250 MG-UNIT TABS Take by mouth. 07/10/21   [provider]  carvedilol (COREG) 3.125 MG tablet Take 3.125 mg by mouth 2 (two) times daily. 05/22/21   [provider]  carvedilol (COREG) 6.25 MG  tablet Take 6.25 mg by mouth 2 (two) times daily. 07/08/21   [provider]  Cholecalciferol (VITAMIN D3) 125 MCG (5000 UT) CAPS Take 5,000 Units by mouth daily. 05/09/21   [provider]  clopidogrel (PLAVIX) 75 MG tablet Take 1 tablet (75 mg total) by mouth daily. 02/22/21   Charlynne Cousins, MD  gabapentin (NEURONTIN) 100 MG capsule Take 100 mg by mouth daily. 01/29/21   [provider]  Insulin Glargine-yfgn 100 UNIT/ML SOPN Inject 18 Units into the skin at bedtime. 05/09/21   [provider]  isosorbide mononitrate (IMDUR) 30 MG 24 hr tablet Take 30 mg by mouth daily. 02/12/21   [provider]  lactulose, encephalopathy, (CHRONULAC) 10 GM/15ML SOLN Take 15 mLs by mouth 2 (two) times daily. 10/08/21   [provider]  loperamide (IMODIUM) 2 MG capsule Take 1 capsule (2 mg total) by mouth as needed for diarrhea or loose stools. 03/31/21   Blake Divine, MD  [facility] 17 GM/SCOOP powder Take by mouth. 10/08/21   [provider]  multivitamin (ONE-A-DAY MEN'S) TABS tablet Take 1 tablet by mouth daily. 11/11/20   [provider]  pantoprazole (PROTONIX) 40 MG tablet Take 1 tablet (40 mg total) by mouth daily. 02/11/21   [name], MD  rosuvastatin (CRESTOR) 40 MG tablet Take 40 mg by mouth daily. 02/13/21   [provider]  sertraline (ZOLOFT) 100 MG tablet Take 200 mg by mouth daily. 11/11/20   [provider]  sucralfate (CARAFATE) 1 g tablet Take 1 g by mouth 4 (four) times daily. 10/08/21   [provider]  SYMBICORT 80-4.5 MCG/ACT inhaler Inhale 2 puffs into the lungs 2 (two) times daily. 09/08/21   [provider]  tamsulosin (FLOMAX) 0.4 MG CAPS capsule Take 0.4 mg by mouth daily. 10/07/21   [provider]  VICTOZA 18 MG/3ML SOPN Inject into the skin. 10/07/21   [provider]    Allergies Atorvastatin, Celecoxib, Dicyclomine hcl, Doxycycline, and Metformin  Family  History  Problem Relation Age of Onset   Heart disease Mother    Alzheimer's disease Mother    Heart disease Father    Diabetes Father     Social History Social History   Tobacco Use   Smoking status: Former   Smokeless tobacco: Never  Substance Use Topics   Alcohol use: Not Currently   Drug use: Not Currently    Review of Systems Constitutional: No fever. Eyes: No visual changes. ENT: No sore throat. Cardiovascular: Denies chest pain. Respiratory: + shortness of breath. Gastrointestinal: No nausea, vomiting, diarrhea. Genitourinary: Negative for dysuria. Musculoskeletal: Negative for back pain. Skin: Negative for rash. Neurological: Negative for focal weakness or numbness.  ____________________________________________   PHYSICAL EXAM:  VITAL SIGNS: ED Triage Vitals  Enc Vitals Group     BP 10/17/21 0307 (!) 175/67     Pulse Rate 10/17/21 0307 (!) 56     Resp 10/17/21 0307 20     Temp 10/17/21 0307 98.2 F (36.8 C)     Temp Source 10/17/21 0307 Oral     SpO2 10/17/21 0307 99 %     Weight 10/17/21 0308 221 lb (100.2 kg)     Height --      Head Circumference --      Peak Flow --      Pain Score 10/17/21 0308 0     Pain Loc --      Pain Edu? --      Excl. in [place]? --    CONSTITUTIONAL: Alert and oriented and responds appropriately to questions.  Elderly, no distress, hard of hearing HEAD: Normocephalic EYES: Conjunctivae clear, pupils appear equal, EOM appear intact ENT: normal nose; moist mucous membranes NECK: Supple, normal ROM CARD: RRR; S1 and S2 appreciated; + murmur, no clicks, no rubs, no gallops RESP: Normal chest excursion without splinting or tachypnea; breath sounds clear and equal bilaterally; no wheezes, no rhonchi, no rales, no hypoxia or respiratory distress, speaking full sentences ABD/GI: Normal bowel sounds; non-distended; soft, non-tender, no rebound, no guarding, no peritoneal signs, no hepatosplenomegaly BACK: The back appears  normal EXT: Normal ROM in all joints; no deformity noted, no edema; no cyanosis, chronic venous stasis dermatitis in bilateral lower extremities, no calf tenderness or calf swelling SKIN: Normal color for age and race; warm; no rash on exposed skin NEURO: Moves all extremities equally PSYCH: The patient's mood and manner are appropriate.  ____________________________________________   LABS (all labs ordered are listed, but only abnormal results are displayed)  Labs Reviewed  CBC -  Abnormal; Notable for the following components:      Result Value   WBC 3.9 (*)    RBC 3.05 (*)    Hemoglobin 9.6 (*)    HCT 28.2 (*)    Platelets 102 (*)    All other components within normal limits  COMPREHENSIVE METABOLIC PANEL - Abnormal; Notable for the following components:   Glucose, Bld 277 (*)    BUN 41 (*)    Creatinine, Ser 1.57 (*)    Calcium 8.4 (*)    AST 56 (*)    GFR, Estimated 45 (*)    All other components within normal limits  TROPONIN I (HIGH SENSITIVITY) - Abnormal; Notable for the following components:   Troponin I (High Sensitivity) 104 (*)    All other components within normal limits  RESP PANEL BY RT-PCR (FLU A&B, COVID) ARPGX2  URINALYSIS, COMPLETE (UACMP) WITH MICROSCOPIC   ____________________________________________  EKG   EKG Interpretation  Date/Time:  Friday October 17 2021 03:13:25 EDT Ventricular Rate:  63 PR Interval:  184 QRS Duration: 116 QT Interval:  404 QTC Calculation: 413 R Axis:   2 Text Interpretation: Undetermined rhythm Right bundle branch block Abnormal ECG Artifact No significant change since last tracing Confirmed by Pryor Curia [phone]) on 10/17/2021 4:10:34 AM        ____________________________________________  RADIOLOGY Jessie Foot Inetta Dicke, personally viewed and evaluated these images (plain radiographs) as part of my medical decision making, as well as reviewing the written report by the radiologist.  ED MD interpretation: Chest  x-ray clear.  Official radiology report(s): DG Chest 2 View  Result Date: 10/17/2021 CLINICAL DATA:  Weakness, shortness of breath EXAM: CHEST - 2 VIEW COMPARISON:  05/23/2021 FINDINGS: Lungs are clear.  No pleural effusion or pneumothorax. The heart is top-normal in size. Left subclavian pacemaker. Status post TAVR. Degenerative changes of the visualized thoracolumbar spine. IMPRESSION: Normal chest radiographs. Electronically Signed   By: Julian Hy M.D.   On: 10/17/2021 03:38    ____________________________________________   PROCEDURES  Procedure(s) performed (including Critical Care):  Procedures  CRITICAL CARE Performed by: Cyril Mourning Zariana Strub   Total critical care time: 45 minutes  Critical care time was exclusive of separately billable procedures and treating other patients.  Critical care was necessary to treat or prevent imminent or life-threatening deterioration.  Critical care was time spent personally by me on the following activities: development of treatment plan with patient and/or surrogate as well as nursing, discussions with consultants, evaluation of patient's response to treatment, examination of patient, obtaining history from patient or surrogate, ordering and performing treatments and interventions, ordering and review of laboratory studies, ordering and review of radiographic studies, pulse oximetry and re-evaluation of patient's condition.  ____________________________________________   INITIAL IMPRESSION / ASSESSMENT AND PLAN / ED COURSE  As part of my medical decision making, I reviewed the following data within the [facility] History obtained from family, Nursing notes reviewed and incorporated, Labs reviewed , EKG interpreted , Old EKG reviewed, Radiograph reviewed , Discussed with admitting physician , and Notes from prior ED visits         Patient here with complaints of shortness of breath, dizziness and generalized weakness that  woke him from sleep.  Now asymptomatic.  EKG shows no ischemic abnormality compared to previous but troponin is elevated.  I suspect that NSTEMI is the cause of his symptoms today.  Doubt dissection, PE.  No sign of volume overload.  No infectious symptoms.  Labs show  pancytopenia which is chronic and stable for patient.  Renal function slightly elevated with creatinine of 1.57.  Will gently hydrate.  Electrolytes within normal limits.  Chest x-ray reviewed by myself and radiology shows no infiltrate, edema or pneumothorax.  Will give aspirin, heparin.  Will discuss with medicine for admission.  ED PROGRESS  4:34 AM Discussed patient's case with hospitalist, Dr. Sidney Ace.  I have recommended admission and patient (and family if present) agree with this plan. Admitting physician will place admission orders.   I reviewed all nursing notes, vitals, pertinent previous records and reviewed/interpreted all EKGs, lab and urine results, imaging (as available).  ____________________________________________   FINAL CLINICAL IMPRESSION(S) / ED DIAGNOSES  Final diagnoses:  NSTEMI (non-ST elevated myocardial infarction) [facility])     ED Discharge Orders     None       *Please note:  Nathan Singleton was evaluated in Emergency Department on 10/17/2021 for the symptoms described in the history of present illness. He was evaluated in the context of the global COVID-19 pandemic, which necessitated consideration that the patient might be at risk for infection with the SARS-CoV-2 virus that causes COVID-19. Institutional protocols and algorithms that pertain to the evaluation of patients at risk for COVID-19 are in a state of rapid change based on information released by regulatory bodies including the CDC and federal and state organizations. These policies and algorithms were followed during the patient's care in the ED.  Some ED evaluations and interventions may be delayed as a result of limited staffing during and  the pandemic.*   Note:  This document was prepared using Dragon voice recognition software and may include unintentional dictation errors.    Yecheskel Kurek, Delice Bison, DO 10/17/21 [phone]

## 2021-10-17 NOTE — ED Notes (Signed)
CBG 84. 

## 2021-10-17 NOTE — ED Notes (Signed)
This RN responds to call light. Pt states he had L shoulder pain. Now resolved. Denies pain at this time. Call light in reach. Spouse at bedside.

## 2021-10-17 NOTE — H&P (Addendum)
History and Physical    Xaviar Lunn JEH:[id number] DOB: 02/14/1941 DOA: 10/17/2021  Referring MD/NP/PA:   PCP: Ranae Plumber, [place]   Patient coming from:  The patient is coming from home.         Chief Complaint: Weakness, palpitation and SOB   HPI: Nathan Singleton is a 80 y.o. male with medical history significant of CAD with stent placement,HTN, HLD, DM, COPD, GERD, gout, depression,ss/p of TAVR with bioprosthesis 2019, SSS, s/p of pacemaker placement, PVD, GI bleeding, hard of hearing, ascending aortic aneurysm, varicose vein, pancytopenia, thrombocytopenia, right bundle blockage, anemia, CKD-3A, BPH, who presents with weakness, palpitation and SOB.  Patient states that he developed weakness, palpitation, heart racing, mild dizziness and mild shortness of breath last night.  No cough, fever or chills.  No unilateral numbness or tinglings in extremities.  No facial droop or slurred speech.  Patient denies chest pain.  His fianc at bedside states that patient had heart attack in the past, but did not have chest pain at time.  Patient does not have nausea vomiting, diarrhea or abdominal pain.  No symptoms of UTI.  No recent fall or head injury.  No rectal bleeding or dark stool. He has a previous stent and is on ASA and Plavix but stopped taking plavix yesterday due to having a procedure scheduled for his back on November 11.  ED Course: pt was found to have troponin level 104, INR 1.1, PTT 35, negative COVID PCR, worsening renal function, temperature normal, blood pressure 184/79, heart rate 54, RR 25, 3 oxygen saturation 92% on room air, chest x-ray negative.  Patient is admitted to progressive bed as inpatient.  Dr. Rockey Situ of cardiology is consulted  Review of Systems:   General: no fevers, chills, no body weight gain, has fatigue HEENT: no blurry vision, hearing changes or sore throat Respiratory: has dyspnea, no coughing, wheezing CV: no chest pain,  has palpitations GI: no nausea,  vomiting, abdominal pain, diarrhea, constipation GU: no dysuria, burning on urination, increased urinary frequency, hematuria  Ext: has trace leg edema Neuro: no unilateral weakness, numbness, or tingling, no vision change or hearing loss. Has dizziness Skin: no rash, no skin tear. MSK: No muscle spasm, no deformity, no limitation of range of movement in spin Heme: No easy bruising.  Travel history: No recent long distant travel.  Allergy:  Allergies  Allergen Reactions   Atorvastatin Other (See Comments)    "bad for kidneys" Cramping    Celecoxib Other (See Comments)    Other reaction(s): Other (See Comments) Kidney Problem Kidney Problem    Dicyclomine Hcl Other (See Comments)    Stomach cramps   Doxycycline Other (See Comments)    Per wife (per RN)- pt gets severe stomach pains even when takes with food and PCP said to not take     Metformin Other (See Comments)    Past Medical History:  Diagnosis Date   Aneurysm (arteriovenous) of coronary vessels    Arthritis    Ascending aortic aneurysm    CAD (coronary artery disease)    Diabetes mellitus without complication (HCC)    Hard of hearing    History of GI bleed    Hyperlipidemia associated with type 2 diabetes mellitus ([place])    Hypertension associated with diabetes ([place])    Presence of permanent cardiac pacemaker    PVD (peripheral vascular disease) ([place])    S/P TAVR (transcatheter aortic valve replacement)     Past Surgical History:  Procedure Laterality Date  AMPUTATION TOE Right 03/20/2021   Procedure: AMPUTATION TOE;  Surgeon: Sharlotte Alamo, DPM;  Location: [facility];  Service: Podiatry;  Laterality: Right;   AMPUTATION TOE Right 06/03/2021   Procedure: AMPUTATION TOE-SECOND TOE;  Surgeon: [name], DPM;  Location: [facility];  Service: Podiatry;  Laterality: Right;   AMPUTATION TOE Right 07/26/2021   Procedure: AMPUTATION TOE-THIRD TOE;  Surgeon: [name], DPM;  Location: [facility];  Service: Podiatry;   Laterality: Right;   Ankle repaired Right    Carpal Tunnel repaired Left    CATARACT EXTRACTION Bilateral    COLONOSCOPY     Copillar implant      DG CHOLECYSTOGRAPHY GALL BLADDER ([facility] HX)     GASTRIC BYPASS     heart valve replaced     IRRIGATION AND DEBRIDEMENT FOOT Right 02/18/2021   Procedure: IRRIGATION AND DEBRIDEMENT FOOT-Right Great Toe;  Surgeon: [name], DPM;  Location: [facility];  Service: Podiatry;  Laterality: Right;   LOWER EXTREMITY ANGIOGRAPHY Right 02/17/2021   Procedure: Lower Extremity Angiography;  Surgeon: Algernon Huxley, MD;  Location: [place] [facility];  Service: Cardiovascular;  Laterality: Right;   LOWER EXTREMITY ANGIOGRAPHY Right 03/27/2021   Procedure: Lower Extremity Angiography;  Surgeon: Algernon Huxley, MD;  Location: [place] [facility];  Service: Cardiovascular;  Laterality: Right;   PACEMAKER GENERATOR CHANGE     REPLACEMENT TOTAL KNEE BILATERAL     RTC     STENT PLACE LEFT URETER ([place] [facility]X) Right    Right Leg   TEE WITHOUT CARDIOVERSION N/A 03/26/2021   Procedure: TRANSESOPHAGEAL ECHOCARDIOGRAM (TEE);  Surgeon: Kate Sable, MD;  Location: [facility];  Service: Cardiovascular;  Laterality: N/A;   TEE WITHOUT CARDIOVERSION N/A 06/04/2021   Procedure: TRANSESOPHAGEAL ECHOCARDIOGRAM (TEE);  Surgeon: Kate Sable, MD;  Location: [facility];  Service: Cardiovascular;  Laterality: N/A;   Toe nail removed Bilateral     Social History:  reports that he has quit smoking. He has never used smokeless tobacco. He reports that he does not currently use alcohol. He reports that he does not currently use drugs.  Family History:  Family History  Problem Relation Age of Onset   Heart disease Mother    Alzheimer's disease Mother    Heart disease Father    Diabetes Father      Prior to Admission medications   Medication Sig Start Date End Date Taking? Authorizing Provider  acetaminophen (TYLENOL) 325 MG tablet Take 2 tablets (650 mg total) by mouth every  6 (six) hours as needed for fever, headache or moderate pain. 05/09/21  Yes Loletha Grayer, MD  albuterol (VENTOLIN HFA) 108 (90 Base) MCG/ACT inhaler Inhale 2 puffs into the lungs every 4 (four) hours as needed for wheezing or shortness of breath. 05/09/21  Yes Wieting, Richard, MD  allopurinol (ZYLOPRIM) 100 MG tablet Take 100 mg by mouth daily. 10/19/20  Yes [provider]  amLODipine (NORVASC) 10 MG tablet Take 1 tablet (10 mg total) by mouth daily. 05/28/21 10/17/21 Yes Nolberto Hanlon, MD  BAYER ASPIRIN EC LOW DOSE 81 MG EC tablet Take 81 mg by mouth daily. 11/11/20  Yes [provider]  CALCIUM CITRATE+D3 PETITES 200-250 MG-UNIT TABS Take by mouth. 07/10/21  Yes [provider]  carvedilol (COREG) 6.25 MG tablet Take 6.25 mg by mouth 2 (two) times daily. 07/08/21  Yes [provider]  Cholecalciferol (VITAMIN D3) 125 MCG (5000 UT) CAPS Take 5,000 Units by mouth daily. 05/09/21  Yes [provider]  gabapentin (NEURONTIN) 100 MG capsule Take 100 mg by mouth daily. 01/29/21  Yes [provider]  Insulin Glargine-yfgn 100 UNIT/ML SOPN Inject 18 Units into the skin at bedtime. 05/09/21  Yes [provider]  isosorbide mononitrate (IMDUR) 30 MG 24 hr tablet Take 30 mg by mouth daily. 02/12/21  Yes [provider]  lactulose, encephalopathy, (CHRONULAC) 10 GM/15ML SOLN Take 15 mLs by mouth 2 (two) times daily. 10/08/21  Yes [provider]  loperamide (IMODIUM) 2 MG capsule Take 1 capsule (2 mg total) by mouth as needed for diarrhea or loose stools. 03/31/21  Yes Blake Divine, MD  [facility] 17 GM/SCOOP powder Take by mouth. 10/08/21  Yes [provider]  multivitamin (ONE-A-DAY MEN'S) TABS tablet Take 1 tablet by mouth daily. 11/11/20  Yes [provider]  pantoprazole (PROTONIX) 40 MG tablet Take 1 tablet (40 mg total) by mouth daily. 02/11/21  Yes [name], MD  rosuvastatin (CRESTOR) 40 MG tablet Take 40 mg by  mouth daily. 02/13/21  Yes [provider]  sertraline (ZOLOFT) 100 MG tablet Take 200 mg by mouth daily. 11/11/20  Yes [provider]  sucralfate (CARAFATE) 1 g tablet Take 1 g by mouth 4 (four) times daily. 10/08/21  Yes [provider]  SYMBICORT 80-4.5 MCG/ACT inhaler Inhale 2 puffs into the lungs 2 (two) times daily. 09/08/21  Yes [provider]  tamsulosin (FLOMAX) 0.4 MG CAPS capsule Take 0.4 mg by mouth daily. 10/07/21  Yes [provider]  VICTOZA 18 MG/3ML SOPN Inject 1.8 mg into the skin daily. 10/07/21  Yes [provider]  clopidogrel (PLAVIX) 75 MG tablet Take 1 tablet (75 mg total) by mouth daily. Patient not taking: No sig reported 02/22/21   Charlynne Cousins, MD    Physical Exam: Vitals:   10/17/21 0630 10/17/21 0828 10/17/21 1030 10/17/21 1100  BP: (!) 184/79 138/69 (!) 173/97 (!) 155/85  Pulse: 61 (!) 58 64 60  Resp: 16 10 16 15   Temp:      TempSrc:      SpO2: 100% 99% 99% 99%  Weight:       General: Not in acute distress HEENT:       Eyes: PERRL, EOMI, no scleral icterus.       ENT: No discharge from the ears and nose, no pharynx injection, no tonsillar enlargement.        Neck: No JVD, no bruit, no mass felt. Heme: No neck lymph node enlargement. Cardiac: S1/S2, RRR, No gallops or rubs. Respiratory: No rales, wheezing, rhonchi or rubs. GI: Soft, nondistended, nontender, no rebound pain, no organomegaly, BS present. GU: No hematuria Ext: has trace leg edema bilaterally. 1+DP/PT pulse bilaterally. Musculoskeletal: No joint deformities, No joint redness or warmth, no limitation of ROM in spin. Skin: No rashes.  Neuro: Alert, oriented X3, cranial nerves II-XII grossly intact, moves all extremities normally. Psych: Patient is not psychotic, no suicidal or hemocidal ideation.  Labs on Admission: I have personally reviewed following labs and imaging studies  CBC: Recent Labs  Lab 10/17/21 0317  WBC 3.9*   HGB 9.6*  HCT 28.2*  MCV 92.5  PLT 854*   Basic Metabolic Panel: Recent Labs  Lab 10/17/21 0317  NA 135  K 4.2  CL 106  CO2 23  GLUCOSE 277*  BUN 41*  CREATININE 1.57*  CALCIUM 8.4*   GFR: Estimated Creatinine Clearance: 46.7 mL/min (A) (by C-G formula based on SCr of 1.57 mg/dL (H)). Liver Function Tests:  Recent Labs  Lab 10/17/21 0317  AST 56*  ALT 44  ALKPHOS 81  BILITOT 0.5  PROT 6.6  ALBUMIN 3.5   No results for input(s): LIPASE, AMYLASE in the last 168 hours. No results for input(s): AMMONIA in the last 168 hours. Coagulation Profile: Recent Labs  Lab 10/17/21 0443  INR 1.1   Cardiac Enzymes: No results for input(s): CKTOTAL, CKMB, CKMBINDEX, TROPONINI in the last 168 hours. BNP (last 3 results) No results for input(s): PROBNP in the last 8760 hours. HbA1C: Recent Labs    10/17/21 0726  HGBA1C 6.9*   CBG: Recent Labs  Lab 10/17/21 0827 10/17/21 1111  GLUCAP 84 180*   Lipid Profile: No results for input(s): CHOL, HDL, LDLCALC, TRIG, CHOLHDL, LDLDIRECT in the last 72 hours. Thyroid Function Tests: No results for input(s): TSH, T4TOTAL, FREET4, T3FREE, THYROIDAB in the last 72 hours. Anemia Panel: No results for input(s): VITAMINB12, FOLATE, FERRITIN, TIBC, IRON, RETICCTPCT in the last 72 hours. Urine analysis:    Component Value Date/Time   COLORURINE STRAW (A) 10/17/2021 0800   APPEARANCEUR CLEAR (A) 10/17/2021 0800   LABSPEC 1.005 10/17/2021 0800   PHURINE 5.0 10/17/2021 0800   GLUCOSEU 50 (A) 10/17/2021 0800   HGBUR NEGATIVE 10/17/2021 0800   BILIRUBINUR NEGATIVE 10/17/2021 0800   KETONESUR NEGATIVE 10/17/2021 0800   PROTEINUR 30 (A) 10/17/2021 0800   NITRITE NEGATIVE 10/17/2021 0800   LEUKOCYTESUR NEGATIVE 10/17/2021 0800   Sepsis Labs: @LABRCNTIP (procalcitonin:4,lacticidven:4) ) Recent Results (from the past 240 hour(s))  Resp Panel by RT-PCR (Flu A&B, Covid) Nasopharyngeal Swab     Status: None   Collection Time: 10/17/21   4:43 AM   Specimen: Nasopharyngeal Swab; Nasopharyngeal(NP) swabs in vial transport medium  Result Value Ref Range Status   SARS Coronavirus 2 by RT PCR NEGATIVE NEGATIVE Final    Comment: (NOTE) SARS-CoV-2 target nucleic acids are NOT DETECTED.  The SARS-CoV-2 RNA is generally detectable in upper respiratory specimens during the acute phase of infection. The lowest concentration of SARS-CoV-2 viral copies this assay can detect is 138 copies/mL. A negative result does not preclude SARS-Cov-2 infection and should not be used as the sole basis for treatment or other patient management decisions. A negative result may occur with  improper specimen collection/handling, submission of specimen other than nasopharyngeal swab, presence of viral mutation(s) within the areas targeted by this assay, and inadequate number of viral copies(<138 copies/mL). A negative result must be combined with clinical observations, patient history, and epidemiological information. The expected result is Negative.  Fact Sheet for Patients:  EntrepreneurPulse.com.au  Fact Sheet for Healthcare Providers:  IncredibleEmployment.be  This test is no t yet approved or cleared by the [place] FDA and  has been authorized for detection and/or diagnosis of SARS-CoV-2 by FDA under an Emergency Use Authorization (EUA). This EUA will remain  in effect (meaning this test can be used) for the duration of the COVID-19 declaration under Section 564(b)(1) of the Act, 21 U.S.C.section 360bbb-3(b)(1), unless the authorization is terminated  or revoked sooner.       Influenza A by PCR NEGATIVE NEGATIVE Final   Influenza B by PCR NEGATIVE NEGATIVE Final    Comment: (NOTE) The Xpert Xpress SARS-CoV-2/FLU/RSV plus assay is intended as an aid in the diagnosis of influenza from Nasopharyngeal swab specimens and should not be used as a sole basis for treatment. Nasal washings and aspirates  are unacceptable for Xpert Xpress SARS-CoV-2/FLU/RSV testing.  Fact Sheet for Patients: EntrepreneurPulse.com.au  Fact Sheet for  Healthcare Providers: IncredibleEmployment.be  This test is not yet approved or cleared by the [place] and has been authorized for detection and/or diagnosis of SARS-CoV-2 by FDA under an Emergency Use Authorization (EUA). This EUA will remain in effect (meaning this test can be used) for the duration of the COVID-19 declaration under Section 564(b)(1) of the Act, 21 U.S.C. section 360bbb-3(b)(1), unless the authorization is terminated or revoked.  Performed at [facility], [address] [place], [place] [address]      Radiological Exams on Admission: DG Chest 2 View  Result Date: 10/17/2021 CLINICAL DATA:  Weakness, shortness of breath EXAM: CHEST - 2 VIEW COMPARISON:  05/23/2021 FINDINGS: Lungs are clear.  No pleural effusion or pneumothorax. The heart is top-normal in size. Left subclavian pacemaker. Status post TAVR. Degenerative changes of the visualized thoracolumbar spine. IMPRESSION: Normal chest radiographs. Electronically Signed   By: Julian Hy M.D.   On: 10/17/2021 03:38   CT HEAD WO CONTRAST (5MM)  Result Date: 10/17/2021 CLINICAL DATA:  Dizziness.  Ringing in ears.  Weakness. EXAM: CT HEAD WITHOUT CONTRAST TECHNIQUE: Contiguous axial images were obtained from the base of the skull through the vertex without intravenous contrast. COMPARISON:  05/25/2021 FINDINGS: Brain: No evidence of acute infarction, hemorrhage, hydrocephalus, extra-axial collection or mass lesion/mass effect. There is mild diffuse low-attenuation within the subcortical and periventricular white matter compatible with chronic microvascular disease. Prominence of the sulci and ventricles compatible with brain atrophy. Vascular: No hyperdense vessel or unexpected calcification. Skull: Normal. Negative for fracture or  focal lesion. 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 Sinuses/Orbits: No acute finding. Other: None. IMPRESSION: 1. No acute intracranial abnormalities. 2. Chronic small vessel ischemic disease and brain atrophy. Electronically Signed   By: Kerby Moors M.D.   On: 10/17/2021 11:43     EKG: I have personally reviewed.  Has artificial defects, difficult to determine rhythm, QTC 413, bifascicular block, early R wave progression, PVC  Assessment/Plan Principal Problem:   NSTEMI (non-ST elevated myocardial infarction) ([place]) Active Problems:   HTN (hypertension)   Thrombocytopenia (HCC)   COPD (chronic obstructive pulmonary disease) (HCC)   Bipolar disorder (HCC)   GERD without esophagitis   Gout   Hyperlipidemia   BPH (benign prostatic hyperplasia)   Dizziness   Weakness   Type 2 diabetes mellitus with diabetic chronic kidney disease (HCC)   Pancytopenia (HCC)   CAD (coronary artery disease)   Acute renal failure superimposed on stage 3a chronic kidney disease (HCC)   NSTEMI and hx of CAD: s/p of stent. Troponin 104 --> 94.  Patient had history of heart attack, but did not have chest pain at that time.  IV heparin started in ED.  Cardiology, Dr. Rockey Situ is consulted.  - admit to progressive unit as inpatient - on IV heparin --> d/c'ed per card - Trend Trop - Repeat EKG in the am  - prn Nitroglycerin, and aspirin, Crestor, imdur - Risk factor stratification: will check FLP and A1C   HTN (hypertension) -IV hydralazine as needed -Amlodipine, Coreg,  Thrombocytopenia  and pancytopenia: This is chronic issue.  Hemoglobin stable, 9.6 today (9.4 on 07/26/2021) -Follow-up with CBC  COPD (chronic obstructive pulmonary disease) ([place]): Stable -Bronchodilators  Bipolar disorder ([place]) -Zoloft  GERD without esophagitis -Protonix, Carafate  Gout: No symptoms now. -Patient is currently not taking  allopurinol  Hyperlipidemia -Crestor  BPH (benign prostatic hyperplasia) -Flomax  Type 2 diabetes mellitus with diabetic chronic kidney disease (  [place]): Recent A1c 7.0, not well controlled.  Patient is taking Victoza and glargine insulin at home -Sliding scale insulin -Decrease glargine dose from 18 to 12 units daily  Acute renal failure superimposed on stage 3a chronic kidney disease ([place]): Baseline creatinine 1.1-1.2.  His creatinine is 1.57, BUN 41.  Likely due to dehydration. -IV fluid: 500 cc normal saline, then 75 cc/h  Dizziness: Etiology is not clear.  Potential differential diagnosis is a posterior circulation stroke.  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 Per Card PA, [employer], TEE in 05/2021 noted possible prior calcified vegetation attached to the right atrial pacemaker lead without acute vegetation or endocarditis. He recommended to repeat blood culture which is reasonable. -will get blood culture.     DVT ppx: on sq Heparin  Code Status: Full code Family Communication:  Yes, patient's fianc   at bed side Disposition Plan:  Anticipate discharge back to previous environment Consults called: Dr. Rockey Situ of cardiology Admission status and Level of care: Progressive Cardiac:    as inpt        Status is: Inpatient  Remains inpatient appropriate because: Patient has a very complicated multiple          Date of Service 10/17/2021    Ivor Costa [facility]   If 7PM-7AM, please contact night-coverage [email or web address] 10/17/2021, 2:37 PM

## 2021-10-17 NOTE — ED Notes (Signed)
Pt denies any complaints at this time- denies cp or sob or weakness. Pt given graham crackers and a water.

## 2021-10-17 NOTE — Progress Notes (Signed)
[place] for heparin infusion Indication: ACS/STEMI  Allergies  Allergen Reactions   Atorvastatin Other (See Comments)    "bad for kidneys" Cramping    Celecoxib Other (See Comments)    Other reaction(s): Other (See Comments) Kidney Problem Kidney Problem    Dicyclomine Hcl Other (See Comments)    Stomach cramps   Doxycycline Other (See Comments)    Per wife (per RN)- pt gets severe stomach pains even when takes with food and PCP said to not take     Metformin Other (See Comments)    Patient Measurements: Weight: 100.2 kg (221 lb) Heparin Dosing Weight: 100.2 kg  Vital Signs: Temp: 98.2 F (36.8 C) (11/04 0307) Temp Source: Oral (11/04 0307) BP: 155/85 (11/04 1100) Pulse Rate: 60 (11/04 1100)  Labs: Recent Labs    10/17/21 0317 10/17/21 0443 10/17/21 0726 10/17/21 1112 10/17/21 1305  HGB 9.6*  --   --   --   --   HCT 28.2*  --   --   --   --   PLT 102*  --   --   --   --   APTT  --  35  --   --   --   LABPROT  --  14.1  --   --   --   INR  --  1.1  --   --   --   HEPARINUNFRC  --   --   --   --  0.41  CREATININE 1.57*  --   --   --   --   TROPONINIHS 104*  --  94* 89*  --      Estimated Creatinine Clearance: 46.7 mL/min (A) (by C-G formula based on SCr of 1.57 mg/dL (H)).   Medical History: Past Medical History:  Diagnosis Date   Aneurysm (arteriovenous) of coronary vessels    Arthritis    Ascending aortic aneurysm    CAD (coronary artery disease)    Diabetes mellitus without complication (HCC)    Hard of hearing    History of GI bleed    Hyperlipidemia associated with type 2 diabetes mellitus (HCC)    Hypertension associated with diabetes ([place])    Presence of permanent cardiac pacemaker    PVD (peripheral vascular disease) (HCC)    S/P TAVR (transcatheter aortic valve replacement)     Assessment: Pt is 80 yo male with hx of CVD, presenting to ED due to SOB, dizziness, and generalized weakness found  with elevated Troponin I.  Goal of Therapy:  Heparin level 0.3-0.7 units/ml Monitor platelets by anticoagulation protocol: Yes   Plan:  HL therapeutic, continue heparin infusion at 1300 units/hr Check HL in 8 hrs to confirm CBC daily while on heparin.  Sherilyn Banker, PharmD Clinical Pharmacist 10/17/2021 1:43 PM

## 2021-10-18 ENCOUNTER — Encounter: Payer: Self-pay | Admitting: Internal Medicine

## 2021-10-18 DIAGNOSIS — R778 Other specified abnormalities of plasma proteins: Secondary | ICD-10-CM

## 2021-10-18 DIAGNOSIS — I2510 Atherosclerotic heart disease of native coronary artery without angina pectoris: Secondary | ICD-10-CM

## 2021-10-18 LAB — GLUCOSE, CAPILLARY
Glucose-Capillary: 126 mg/dL — ABNORMAL HIGH (ref 70–99)
Glucose-Capillary: 151 mg/dL — ABNORMAL HIGH (ref 70–99)
Glucose-Capillary: 205 mg/dL — ABNORMAL HIGH (ref 70–99)
Glucose-Capillary: 143 mg/dL — ABNORMAL HIGH (ref 70–99)

## 2021-10-18 MED ORDER — LACTATED RINGERS IV SOLN: INTRAVENOUS | Status: DC

## 2021-10-18 MED ORDER — SIMETHICONE 80 MG PO CHEW
160.0000 mg | CHEWABLE_TABLET | Freq: Four times a day (QID) | ORAL | Status: DC | PRN
  Administered 2021-10-19 (×2): 160 mg via ORAL
  Filled 2021-10-18 (×3): qty 2

## 2021-10-18 NOTE — Progress Notes (Signed)
Progress Note  Patient Name: Nathan Singleton Date of Encounter: 10/18/2021  [facility] Cardiologist: Agustin Cree  Subjective   Patient denies chest pain or significant shortness of breath. Resting comfortably in bed.   Inpatient Medications    Scheduled Meds:  amLODipine  10 mg Oral Daily   aspirin  81 mg Oral Daily   calcium citrate  1 tablet Oral Daily   carvedilol  6.25 mg Oral BID   cholecalciferol  5,000 Units Oral Daily   gabapentin  100 mg Oral Daily   heparin  5,000 Units Subcutaneous Q8H   insulin aspart  0-5 Units Subcutaneous QHS   insulin aspart  0-9 Units Subcutaneous TID WC   insulin glargine-yfgn  12 Units Subcutaneous QHS   isosorbide mononitrate  30 mg Oral Daily   mometasone-formoterol  2 puff Inhalation BID   multivitamin with minerals  1 tablet Oral Daily   pantoprazole  40 mg Oral Daily   rosuvastatin  40 mg Oral Daily   sertraline  200 mg Oral Daily   sucralfate  1 g Oral QID   tamsulosin  0.4 mg Oral Daily   Continuous Infusions:  sodium chloride 75 mL/hr at 10/17/21 0809   PRN Meds: acetaminophen, albuterol, dextromethorphan-guaiFENesin, hydrALAZINE, loperamide, meclizine, nitroGLYCERIN, ondansetron (ZOFRAN) IV   Vital Signs    Vitals:   10/17/21 2322 10/18/21 0018 10/18/21 0450 10/18/21 0541  BP: (!) 179/86 (!) 152/60 (!) 154/64   Pulse: 60 67 67   Resp: 20 20 18    Temp: 98.4 F (36.9 C) 98.4 F (36.9 C) 98.6 F (37 C)   TempSrc: Oral Oral Oral   SpO2:  98% 99%   Weight: 100.2 kg   99.5 kg  Height: 6\' 4"  (1.93 m)       Intake/Output Summary (Last 24 hours) at 10/18/2021 0736 Last data filed at 10/18/2021 0545 Gross per 24 hour  Intake 2408.75 ml  Output 2251 ml  Net 157.75 ml   Last 3 Weights 10/18/2021 10/17/2021 10/17/2021  Weight (lbs) 219 lb 5.7 oz 220 lb 14.4 oz 221 lb  Weight (kg) 99.5 kg 100.2 kg 100.245 kg      Telemetry     Paced rhythm, NSR, HR 60s- Personally Reviewed  ECG    No new - Personally  Reviewed  Physical Exam   GEN: No acute distress.   Neck: No JVD Cardiac: RRR, + murmur, no rubs, or gallops.  Respiratory: Clear to auscultation bilaterally. GI: Soft, nontender, non-distended  MS: No edema; No deformity. Neuro:  Nonfocal  Psych: Normal affect   Labs    High Sensitivity Troponin:   Recent Labs  Lab 10/17/21 0317 10/17/21 0726 10/17/21 1112 10/17/21 1305  TROPONINIHS 104* 94* 89* 81*     Chemistry Recent Labs  Lab 10/17/21 0317  NA 135  K 4.2  CL 106  CO2 23  GLUCOSE 277*  BUN 41*  CREATININE 1.57*  CALCIUM 8.4*  PROT 6.6  ALBUMIN 3.5  AST 56*  ALT 44  ALKPHOS 81  BILITOT 0.5  GFRNONAA 45*  ANIONGAP 6    Lipids No results for input(s): CHOL, TRIG, HDL, LABVLDL, LDLCALC, CHOLHDL in the last 168 hours.  Hematology Recent Labs  Lab 10/17/21 0317  WBC 3.9*  RBC 3.05*  HGB 9.6*  HCT 28.2*  MCV 92.5  MCH 31.5  MCHC 34.0  RDW 13.6  PLT 102*   Thyroid No results for input(s): TSH, FREET4 in the last 168 hours.  BNP Recent Labs  Lab 10/17/21 0726  BNP 85.8    DDimer No results for input(s): DDIMER in the last 168 hours.   Radiology    DG Chest 2 View  Result Date: 10/17/2021 CLINICAL DATA:  Weakness, shortness of breath EXAM: CHEST - 2 VIEW COMPARISON:  05/23/2021 FINDINGS: Lungs are clear.  No pleural effusion or pneumothorax. The heart is top-normal in size. Left subclavian pacemaker. Status post TAVR. Degenerative changes of the visualized thoracolumbar spine. IMPRESSION: Normal chest radiographs. Electronically Signed   By: Julian Hy M.D.   On: 10/17/2021 03:38   CT HEAD WO CONTRAST (5MM)  Result Date: 10/17/2021 CLINICAL DATA:  Dizziness.  Ringing in ears.  Weakness. EXAM: CT HEAD WITHOUT CONTRAST TECHNIQUE: Contiguous axial images were obtained from the base of the skull through the vertex without intravenous contrast. COMPARISON:  05/25/2021 FINDINGS: Brain: No evidence of acute infarction, hemorrhage, hydrocephalus,  extra-axial collection or mass lesion/mass effect. There is mild diffuse low-attenuation within the subcortical and periventricular white matter compatible with chronic microvascular disease. Prominence of the sulci and ventricles compatible with brain atrophy. Vascular: No hyperdense vessel or unexpected calcification. Skull: Normal. Negative for fracture or focal lesion. 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 Sinuses/Orbits: No acute finding. Other: None. IMPRESSION: 1. No acute intracranial abnormalities. 2. Chronic small vessel ischemic disease and brain atrophy. Electronically Signed   By: Kerby Moors M.D.   On: 10/17/2021 11:43    Cardiac Studies   TEE 05/2021: 1. Left ventricular ejection fraction, by estimation, is 60%. The left  ventricle has normal function.   2. Right ventricular systolic function is normal. The right ventricular  size is normal.   3. Left atrial size was mildly dilated. No left atrial/left atrial  appendage thrombus was detected.   4. The mitral valve is normal in structure. Mild mitral valve  regurgitation.   5. The aortic valve has been repaired/replaced. Aortic valve  regurgitation is not visualized. Echo findings are consistent with normal  structure and function of the aortic valve prosthesis.   Conclusion(s)/Recommendation(s): There is a small 5 x 3 mm  calcified/fibrinous structure in the right atrium (image clip 31),  attached to the pacemaker lead. This was present on prior TEecho 2 months  ago. likely represents prior calcified vegetation vs  thrombus. no acute vegetation or endocarditis noted on this TEE. __________   TEE 03/2021: 1. Left ventricular ejection fraction, by estimation, is 55 to 60%. The  left ventricle has normal function.   2. Right ventricular systolic function is normal. The right ventricular  size is normal.   3. Left atrial size was mildly dilated. No left atrial/left atrial   appendage thrombus was detected.   4. The mitral valve is normal in structure. Mild mitral valve  regurgitation.   5. The aortic valve has been repaired/replaced. Aortic valve  regurgitation is not visualized. There is a Sapien prosthetic (TAVR) valve  present in the aortic position. Echo findings are consistent with normal  structure and function of the aortic valve  prosthesis.   Conclusion(s)/Recommendation(s): No evidence of vegetation/infective  endocarditis on this transesophageal  echocardiogram.  __________   2D echo 03/2021: 1. Left ventricular ejection fraction, by estimation, is >55%. The left  ventricle has normal function. Left ventricular endocardial border not  optimally defined to evaluate regional wall motion. Left ventricular  diastolic parameters are indeterminate.   2. Right ventricular systolic function is normal. The right ventricular  size is normal.  3. Left atrial size was mildly dilated.   4. The mitral valve was not well visualized. Mild mitral valve  regurgitation. No evidence of mitral stenosis.   5. The aortic valve was not well visualized. Aortic valve regurgitation  is not visualized. Mild aortic valve stenosis. Aortic valve mean gradient  measures 16.0 mmHg.   6. The inferior vena cava is dilated in size with <50% respiratory  variability, suggesting right atrial pressure of 15 mmHg.   Conclusion(s)/Recommendation(s): No definite evidence of endocarditis.  However, acoustic windows are suboptimal and transesophageal  echocardiogram should be considered in the setting of implanted cardiac  devices and MSSA bacteremia.     Patient Profile     80 y.o. 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  Assessment & Plan    Elevated troponin CAD s/p PCI 2009 - mild troponin elevation with peak 104, not consistent with ACS - suspect demand ischemia in the setting of known CAD with elevated bp, renal dysfunction and dehydration - No indication for IV heparin - no plans for inpatient ischemic evaluation - continue ASA, amlodipine, Coreg, Imdur, Crestor  Aortic stenosis s/p TAVR - normal structure and function of AVR in 05/2021 - if blood cultures positive will need repeat echo  SSS s/p dual chamber PPM - normal functioning device, consider device check - previously admitted with MSSA bacteremia in 03/2021 and again in 05/2021  HTN - bp elevated - can titrate coreg vs Imdur  AKI on CKD stage 3 - s/p IVF  Generalized weakness - h/o recurrent MSSA - TEE 05/2021 showed possible prior calcified vegetation attached to the right atrial pacemaker lead without acute vegatation or endocarditis - blood cultures pending.   For questions or updates, please contact [place] Please consult [email or web address] for contact info under        Signed, Shanquita Ronning Ninfa Meeker, PA-C  10/18/2021, 7:36 AM

## 2021-10-18 NOTE — TOC Initial Note (Signed)
Transition of Care [facility]) - Initial/Assessment Note    Patient Details  Name: Nathan Singleton MRN: [id number] Date of Birth: 10-05-1941  Transition of Care [facility]) CM/SW Contact:    Alberteen Sam, LCSW Phone Number: 10/18/2021, 10:07 AM  Clinical Narrative:                  Per chart review CSW notes patient has a hx of being on home IV antibiotics followed by Pam from [facility].   Patient has a hx of being active with home health services through [facility],   Patient's girlfriend [name] is home support for patient.   Patient has a Rolling walker, a cane and a 3 in 1 at home. Patient has transportation and can afford his medication.  TOC following for PT/OT recommendations, pending eval at this time.   Expected Discharge Plan: [place] Barriers to Discharge: Continued Medical Work up   Patient Goals and CMS Choice   CMS Medicare.gov Compare Post Acute Care list provided to:: Patient Choice offered to / list presented to : Patient  Expected Discharge Plan and Services Expected Discharge Plan: [place]       Living arrangements for the past 2 months: Single Family Home                                      Prior Living Arrangements/Services Living arrangements for the past 2 months: Single Family Home Lives with:: Self                   Activities of Daily Living Home Assistive Devices/Equipment: Cane (specify quad or straight), Wheelchair ADL Screening (condition at time of admission) Patient's cognitive ability adequate to safely complete daily activities?: Yes Is the patient deaf or have difficulty hearing?: Yes Does the patient have difficulty seeing, even when wearing glasses/contacts?: Yes Does the patient have difficulty concentrating, remembering, or making decisions?: No Patient able to express need for assistance with ADLs?: Yes Does the patient have difficulty dressing or bathing?:  No Independently performs ADLs?: Yes (appropriate for developmental age) Does the patient have difficulty walking or climbing stairs?: No Weakness of Legs: None Weakness of Arms/Hands: None  Permission Sought/Granted                  Emotional Assessment       Orientation: : Oriented to Self, Oriented to  Time, Oriented to Situation, Oriented to Place Alcohol / Substance Use: Not Applicable Psych Involvement: No (comment)  Admission diagnosis:  NSTEMI (non-ST elevated myocardial infarction) [facility]) [I21.4] Patient Active Problem List   Diagnosis Date Noted   NSTEMI (non-ST elevated myocardial infarction) ([place]) 10/17/2021   Pancytopenia ([place]) 10/17/2021   CAD (coronary artery disease) 10/17/2021   Acute renal failure superimposed on stage 3a chronic kidney disease ([place]) 10/17/2021   Osteoarthritis of shoulder (Left) 09/15/2021   Osteoarthritis of AC (acromioclavicular) joint (Left) 09/15/2021   Osteoarthritis of glenohumeral joint (Left) 09/15/2021   Limited internal rotation of glenohumeral joint of shoulder (Left) 09/15/2021   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 ([place]) 87/68/1157   Arthritis 08/19/2021   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   History of GI bleed 08/19/2021   MSSA bacteremia 08/19/2021   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[place]) 08/19/2021   Orthostatic hypotension 08/19/2021   Weakness 08/19/2021   HTN (hypertension) 06/27/2021  AKI (acute kidney injury) ([place]) 06/27/2021   AMS (altered mental status) 06/27/2021   CKD stage G3a/A3, GFR 45-59 and albumin creatinine ratio >300 mg/g ([place]) 06/27/2021   Peripheral vascular disease ([place]) 06/25/2021   Edema, unspecified 06/25/2021   Proteinuria, unspecified 06/25/2021   Type 2 diabetes mellitus with diabetic chronic kidney disease ([place]) 06/25/2021   Anemia in chronic kidney disease 06/25/2021   Bacteremia    Osteomyelitis ([place]) 06/01/2021   Syncope and collapse  05/25/2021   Spondylosis without myelopathy or radiculopathy, lumbosacral region 05/12/2021   CAP (community acquired pneumonia) 05/06/2021   Syncope, vasovagal 05/06/2021   CKD (chronic kidney disease), stage IIIa 05/06/2021   Diarrhea 05/06/2021   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[place]) 05/06/2021   Osteoarthritis of facet joint at L5-S1 level of lumbosacral spine 04/22/2021   Lumbar facet joint syndrome (Bilateral) (L>R) 04/22/2021   Anemia of chronic disease    Cellulitis of second toe of right foot 03/20/2021   Type 2 diabetes mellitus with hyperlipidemia ([place]) 03/20/2021   Chronic kidney disease, stage 3a ([place]) 03/20/2021   Dizziness 03/20/2021   Chronic pain syndrome 03/03/2021   Pharmacologic therapy 03/03/2021   Disorder of skeletal system 03/03/2021   Problems influencing health status 69/48/5462   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[place]) 03/03/2021   Chronic lower extremity pain (2ry area of Pain) (Bilateral) (R>L) 03/03/2021   Chronic anticoagulation (Plavix) 03/03/2021   Chronic shoulder pain (3ry area of Pain) (Left) 03/03/2021   History of total knee replacement (Bilateral) 03/03/2021   DDD (degenerative disc disease), thoracic 03/03/2021   Cervical facet hypertrophy (Multilevel) (Bilateral) 03/03/2021   DDD (degenerative disc disease), cervical 03/03/2021   BPH (benign prostatic hyperplasia) 02/14/2021   Diabetic foot ulcer ([place]) 02/13/2021   Hyperlipidemia associated with type 2 diabetes mellitus ([place]) 11/26/2020   Normocytic anemia 11/26/2020   Chronic kidney disease (CKD) 11/26/2020   Claudication ([place]) 11/26/2020   Contusion of right thigh 11/26/2020   COPD (chronic obstructive pulmonary disease) ([place]) 11/26/2020   Dyspnea 11/26/2020   Erectile dysfunction 11/26/2020   GERD without esophagitis 11/26/2020   Gout 11/26/2020   Hyperlipidemia, acquired 11/26/2020   Hyperlipidemia 11/26/2020   Myocardial infarction ([place]) 11/26/2020   Personal history of other diseases of  urinary system 11/26/2020   Right leg swelling 11/26/2020   Sleep apnea 11/26/2020   Varicose veins 11/26/2020   Venous insufficiency 11/26/2020   Vitamin D deficiency 11/26/2020   Chest pain 11/21/2020   Thrombocytopenia ([place]) 11/21/2020   Presence of permanent cardiac pacemaker    Hypertension associated with diabetes [facility])    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 11/19/2020   Pacemaker St Jude device 11/19/2020   Peripheral vascular disease, unspecified ([place]) stents to popliteal arteries many years ago 11/19/2020   History of gingival bleeding 11/19/2020   Coronary artery disease involving coronary bypass graft of native heart with angina pectoris ([place]) 11/19/2020   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 11/19/2020   DM2 (diabetes mellitus, type 2) ([place])    Closed nondisplaced fracture of greater trochanter of right femur ([place]) 01/25/2020   DDD (degenerative disc disease), lumbar 12/10/2019   Chronic low back pain (1ry area of Pain) (Bilateral) (L>R) w/o sciatica 12/09/2019   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   Urinary retention 12/09/2019   Obesity 04/07/2019   Retinopathy 04/07/2019   Deafness, left 01/12/2019   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/12/2019   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   Bilateral tinnitus 08/24/2018  Disequilibrium 08/24/2018   Hypertrophy of nasal turbinates 08/24/2018   Hypertrophy of tonsils 08/24/2018   Obstructive sleep apnea of adult 05/30/2018   Presence of prosthetic heart valve 05/30/2018   Heart block 04/25/2018   Calculus of gallbladder with acute cholecystitis without obstruction 09/21/2017   Right bundle branch block 12/25/2016   Gastrojejunal ulcer 03/23/2014   Jejunal ulcer 03/23/2014   Insomnia 03/22/2014   Blisters of multiple sites 01/18/2014   Cellulitis 01/18/2014   OA (osteoarthritis) of knee 06/09/2013   Status post percutaneous transluminal coronary  angioplasty 01/14/2011   Coronary arteriosclerosis 01/29/2009   Bipolar disorder ([place]) 01/29/2009   Depression 01/29/2009   Hearing loss 01/29/2009   PCP:  Ranae Plumber, [facility]:   [place], [address] [address] Phone: [phone] Fax: [phone]     Social Determinants of Health (SDOH) Interventions    Readmission Risk Interventions Readmission Risk Prevention Plan 05/07/2021 03/23/2021 02/14/2021  Transportation Screening Complete Complete Complete  Medication Review (RN Care Manager) Complete Complete Complete  PCP or Specialist appointment within 3-5 days of discharge Complete Complete Complete  HRI or Home Care Consult Complete Complete Complete  SW Recovery Care/Counseling Consult Complete Complete Complete  Palliative Care Screening Not Applicable Not Applicable Complete  Skilled Nursing Facility Not Applicable Not Applicable Complete

## 2021-10-18 NOTE — Evaluation (Signed)
Occupational Therapy Evaluation Patient Details Name: Nathan Singleton MRN: [id number] DOB: [birth year] Today's Date: 10/18/2021   History of Present Illness 80 y.o. male with medical history significant of CAD with stent placement,HTN, HLD, DM, COPD, GERD, gout, depression,ss/p of TAVR with bioprosthesis 2019, SSS, s/p of pacemaker placement, PVD, GI bleeding, hard of hearing, ascending aortic aneurysm, varicose vein, pancytopenia, thrombocytopenia, right bundle blockage, anemia, CKD-3A, BPH, who presents with weakness, palpitation and SOB. Pt admitted with NSTEMI.   Clinical Impression   Nathan Singleton was seen for OT evaluation this date. Prior to hospital admission, pt was Independent for mobility and I/ADLs. Pt lives with his fiancee in a 1 story home with 2 STE. Pt currently requires SUPERVISION for toielt t/f, urinating standing, and hand washing sinkside - cues for safety as pt attempts to move furniture by pushing walker into it and picks up walker to navigate in small spaces. MOD I don/doff socks seated EOB. Pt and family instructed in falls prevention, home/routines modifications, ECS, and DME recs. No further skilled OT needs identified. Will sign off. Please re-consult if additional OT needs arise.     Recommendations for follow up therapy are one component of a multi-disciplinary discharge planning process, led by the attending physician.  Recommendations may be updated based on patient status, additional functional criteria and insurance authorization.   Follow Up Recommendations  No OT follow up    Assistance Recommended at Discharge Set up Supervision/Assistance  Functional Status Assessment  Patient has not had a recent decline in their functional status  Equipment Recommendations  None recommended by OT    Recommendations for Other Services       Precautions / Restrictions Precautions Precautions: None Restrictions Weight Bearing Restrictions: No      Mobility Bed  Mobility Overal bed mobility: Modified Independent             General bed mobility comments: increased time and effort to complete    Transfers Overall transfer level: Needs assistance Equipment used: Rolling walker (2 wheels) Transfers: Sit to/from Stand Sit to Stand: Supervision           General transfer comment: close SUP for safety      Balance Overall balance assessment: Needs assistance Sitting-balance support: No upper extremity supported;Feet supported Sitting balance-Leahy Scale: Good     Standing balance support: During functional activity;No upper extremity supported Standing balance-Leahy Scale: Fair                           ADL either performed or assessed with clinical judgement   ADL Overall ADL's : Needs assistance/impaired                                       General ADL Comments: SUPERVISION for toielt t/f, urinating standing, and hand washing sinkside - cues for safety as pt attempts to move furniture by pushing walker into it and picks up walker to navigate in small spaces. MOD I don/doff socks seated EOB.      Pertinent Vitals/Pain Pain Assessment: No/denies pain     Hand Dominance     Extremity/Trunk Assessment Upper Extremity Assessment Upper Extremity Assessment: Generalized weakness   Lower Extremity Assessment Lower Extremity Assessment: Generalized weakness   Cervical / Trunk Assessment Cervical / Trunk Assessment: Kyphotic   Communication Communication Communication: HOH   Cognition Arousal/Alertness: Awake/alert Behavior During Therapy:  WFL for tasks assessed/performed Overall Cognitive Status: Within Functional Limits for tasks assessed                                 General Comments: A&Ox4     General Comments       Exercises Exercises: Other exercises Other Exercises Other Exercises: Pt and family educated re: OT role, DME recs, d/c recs, falls prevention Other  Exercises: LBD, toileting, hand washing, sup<>sit, sit<>stand, sitting/standing balnace/tolerance, ~20 ft mobility.   Shoulder Instructions      Home Living Family/patient expects to be discharged to:: Private residence Living Arrangements: Spouse/significant other Available Help at Discharge: Family;Available 24 hours/day Type of Home: House Home Access: Stairs to enter [employer] of Steps: 2  -- currently working on Financial risk analyst: None Home Layout: One level     Bathroom Shower/Tub: Teacher, early years/pre: Standard     Home Equipment: Conservation officer, nature (2 wheels);Cane - single point;Rollator (4 wheels);Grab bars - tub/shower;BSC (reports BSC does not fit in bathroom, tub bench also would not fit)   Additional Comments: Has been alternating between RW and SPC as digits 1-3 have been amputated 1 at a time over the past couple months.      Prior Functioning/Environment Prior Level of Function : Independent/Modified Independent;History of Falls (last six months)             Mobility Comments: 3 falls in the last 6 months - due to tripping, slipping and [place] ADLs Comments: [name] does assist with washing his back in the shower.        OT Problem List: Impaired balance (sitting and/or standing);Decreased activity tolerance;Decreased safety awareness         OT Goals(Current goals can be found in the care plan section) Acute Rehab OT Goals Patient Stated Goal: to go home OT Goal Formulation: With patient/family Time For Goal Achievement: 11/01/21 Potential to Achieve Goals: Good   AM-PAC OT "6 Clicks" Daily Activity     Outcome Measure Help from another person eating meals?: None Help from another person taking care of personal grooming?: None Help from another person toileting, which includes using toliet, bedpan, or urinal?: A Little Help from another person bathing (including washing, rinsing, drying)?: A Little Help from  another person to put on and taking off regular upper body clothing?: None Help from another person to put on and taking off regular lower body clothing?: None 6 Click Score: 22   End of Session Equipment Utilized During Treatment: Rolling walker (2 wheels)  Activity Tolerance: Patient tolerated treatment well Patient left: in bed;with call bell/phone within reach;with family/visitor present  OT Visit Diagnosis: Unsteadiness on feet (R26.81)                Time: 6295-2841 OT Time Calculation (min): 16 min Charges:  OT General Charges $OT Visit: 1 Visit OT Evaluation $OT Eval Low Complexity: 1 Low  Dessie Coma, M.S. OTR/L  10/18/21, 2:44 PM  ascom [phone]

## 2021-10-18 NOTE — Evaluation (Signed)
Physical Therapy Evaluation Patient Details Name: Nathan Singleton MRN: [id number] DOB: February 23, 1941 Today's Date: 10/18/2021  History of Present Illness  80 y.o. male with medical history significant of CAD with stent placement,HTN, HLD, DM, COPD, GERD, gout, depression,ss/p of TAVR with bioprosthesis 2019, SSS, s/p of pacemaker placement, PVD, GI bleeding, hard of hearing, ascending aortic aneurysm, varicose vein, pancytopenia, thrombocytopenia, right bundle blockage, anemia, CKD-3A, BPH, who presents with weakness, palpitation and SOB. Pt admitted with NSTEMI.   Clinical Impression  Pt received supine in bed, agreeable to therapy. He is very HOH. Pt lives with his fiancee in single story home with 2 STE. Pt states his church is working on installing a ramp. Recently, he has been using a SPC. He performed bed mobility Mod I, STS with SUP and 274ft of ambulation with initial CGA progressing to SUP. Pt well aware of endurance limitations and notifies PT when he is ready to return to room. PT educated pt on rec to use RW at d/c and progress to [facility] with HHPT as he is ready. Would benefit from skilled PT to address above deficits and promote optimal return to PLOF. PT rec home with HHPT, no DME needs.      Recommendations for follow up therapy are one component of a multi-disciplinary discharge planning process, led by the attending physician.  Recommendations may be updated based on patient status, additional functional criteria and insurance authorization.  Follow Up Recommendations Home health PT    Assistance Recommended at Discharge Set up Supervision/Assistance  Functional Status Assessment Patient has had a recent decline in their functional status and demonstrates the ability to make significant improvements in function in a reasonable and predictable amount of time.  Equipment Recommendations  None recommended by PT    Recommendations for Other Services       Precautions / Restrictions  Precautions Precautions: None Restrictions Weight Bearing Restrictions: No      Mobility  Bed Mobility Overal bed mobility: Modified Independent             General bed mobility comments: increased time and effort to complete    Transfers Overall transfer level: Needs assistance Equipment used: Rolling walker (2 wheels) Transfers: Sit to/from Stand Sit to Stand: Supervision           General transfer comment: close SUP for safety    Ambulation/Gait Ambulation/Gait assistance: Min guard Gait Distance (Feet): 280 Feet Assistive device: Rolling walker (2 wheels) Gait Pattern/deviations: Step-through pattern;Trunk flexed;Wide base of support Gait velocity: decreased   General Gait Details: Pt ambulates with functional gait pattern, slightly wide with decreased push off from RLE due to toe amputations. Increased weight shift side to side.  Stairs            Wheelchair Mobility    Modified Rankin (Stroke Patients Only)       Balance Overall balance assessment: Needs assistance Sitting-balance support: No upper extremity supported;Feet supported Sitting balance-Leahy Scale: Good     Standing balance support: Bilateral upper extremity supported;During functional activity Standing balance-Leahy Scale: Fair Standing balance comment: Does not rely on RW in static stance however does during ambulation.                             Pertinent Vitals/Pain Pain Assessment: No/denies pain    Home Living Family/patient expects to be discharged to:: Private residence Living Arrangements: Spouse/significant other Available Help at Discharge: Family;Available 24 hours/day Type of Home: House  Home Access: Stairs to enter Entrance Stairs-Rails: None Entrance Stairs-Number of Steps: 2  -- currently working on installing ramp   Home Layout: One level Home Equipment: Conservation officer, nature (2 wheels);Cane - single point;Rollator (4 wheels) Additional Comments:  Has been alternating between RW and SPC as digits 1-3 have been amputated 1 at a time over the past couple months.    Prior Function Prior Level of Function : Independent/Modified Independent;History of Falls (last six months)             Mobility Comments: 3 falls in the last 6 months - due to tripping, slipping and [place] ADLs Comments: [name] does assist with washing his back in the shower.     Hand Dominance        Extremity/Trunk Assessment   Upper Extremity Assessment Upper Extremity Assessment: Generalized weakness    Lower Extremity Assessment Lower Extremity Assessment: Generalized weakness;Overall [facility] for tasks assessed (4/5 B hip flexors and R DF, 5/5 B knee ext and L DF)    Cervical / Trunk Assessment Cervical / Trunk Assessment: Kyphotic  Communication   Communication: HOH  Cognition Arousal/Alertness: Awake/alert Behavior During Therapy: WFL for tasks assessed/performed Overall Cognitive Status: Within Functional Limits for tasks assessed                                 General Comments: A&Ox4        General Comments      Exercises     Assessment/Plan    PT Assessment Patient needs continued PT services  PT Problem List Decreased strength;Decreased mobility;Decreased safety awareness;Decreased balance;Impaired sensation       PT Treatment Interventions Gait training;Stair training;Functional mobility training;DME instruction;Therapeutic activities;Therapeutic exercise;Balance training;Neuromuscular re-education;Patient/family education    PT Goals (Current goals can be found in the Care Plan section)  Acute Rehab PT Goals Patient Stated Goal: to go home PT Goal Formulation: With patient Time For Goal Achievement: 11/01/21 Potential to Achieve Goals: Good    Frequency Min 2X/week   Barriers to discharge        Co-evaluation               AM-PAC PT "6 Clicks" Mobility  Outcome Measure Help needed turning from your  back to your side while in a flat bed without using bedrails?: None Help needed moving from lying on your back to sitting on the side of a flat bed without using bedrails?: None Help needed moving to and from a bed to a chair (including a wheelchair)?: A Little Help needed standing up from a chair using your arms (e.g., wheelchair or bedside chair)?: A Little Help needed to walk in hospital room?: A Little Help needed climbing 3-5 steps with a railing? : A Little 6 Click Score: 20    End of Session Equipment Utilized During Treatment: Gait belt Activity Tolerance: Patient tolerated treatment well Patient left: in bed;in chair;with call bell/phone within reach;with chair alarm set Nurse Communication: Mobility status;Precautions PT Visit Diagnosis: Unsteadiness on feet (R26.81);Other abnormalities of gait and mobility (R26.89);History of falling (Z91.81);Difficulty in walking, not elsewhere classified (R26.2);Muscle weakness (generalized) (M62.81)    Time: 4196-2229 PT Time Calculation (min) (ACUTE ONLY): 28 min   Charges:   PT Evaluation $PT Eval Moderate Complexity: 1 Mod PT Treatments $Therapeutic Activity: 8-22 mins        Patrina Levering PT, DPT 10/18/21 1:21 PM [phone]

## 2021-10-18 NOTE — Progress Notes (Signed)
PROGRESS NOTE    Nathan Singleton  DEY:[id number] DOB: 13-Jun-1941 DOA: 10/17/2021 PCP: Ranae Plumber, PA   Brief Narrative: Taken from H&P. Eshawn Coor is a 80 y.o. male with medical history significant of CAD with stent placement,HTN, HLD, DM, COPD, GERD, gout, depression,ss/p of TAVR with bioprosthesis 2019, SSS, s/p of pacemaker placement, PVD, GI bleeding, hard of hearing, ascending aortic aneurysm, varicose vein, pancytopenia, thrombocytopenia, right bundle blockage, anemia, CKD-3A, BPH, who presents with weakness, palpitation and SOB. CT head was negative for any acute changes.  He was on DAPT at home and stopped taking Plavix a day prior to the admission due to a procedure scheduled for his back on November 11. Found to have mild AKI and appears little dry.  No other abnormality noted.  Mildly elevated troponin most likely secondary to demand ischemia, not in NSTEMI range. Blood cultures in 24 hours negative. Cardiology was also consulted as there was some concern of vegetation during his prior MSSA bacteremia.  No need for repeat echocardiogram if blood cultures remain negative. PT evaluation pending  Subjective: Patient continues to feel little weak.  Denies any chest pain or shortness of breath.  Assessment & Plan:   Principal Problem:   NSTEMI (non-ST elevated myocardial infarction) ([place]) Active Problems:   HTN (hypertension)   Thrombocytopenia (HCC)   COPD (chronic obstructive pulmonary disease) (HCC)   Bipolar disorder (HCC)   GERD without esophagitis   Gout   Hyperlipidemia   BPH (benign prostatic hyperplasia)   Dizziness   Weakness   Type 2 diabetes mellitus with diabetic chronic kidney disease (HCC)   Pancytopenia (HCC)   CAD (coronary artery disease)   Acute renal failure superimposed on stage 3a chronic kidney disease (HCC)  Elevated troponin with history of CAD.  S/p PCI.  Troponin peaked at 104 and then trending down, does not meet the criteria for NSTEMI.   No chest pain or shortness of breath today.  Cardiology was consulted and they are not recommending any further work-up. -Continue with current management which include aspirin, Crestor and Imdur.  Dizziness/generalized weakness.  Patient has an history of multiple hospitalization during current year, having multiple osteomyelitis s/p 3 toes amputation and MSSA bacteremia twice in summer and spring 2022.  In TEE done in June 2022 noted possible prior calcified vegetation attached to the right atrial pacemaker lead without acute vegetation or endocarditis.  They were recommending repeat blood cultures which were obtained on admission and remain negative in 24 hours. No sign of active infection.  May be mild dehydration as evident from BMP. -PT and/OT evaluation pending.  AKI with CKD stage IIIa.  Baseline creatinine around 1.1-1.2.  Appears little dry and received some IV fluid.  Repeat BMP this morning was collected in chart but results are pending. -Monitor renal function -Avoid nephrotoxins  Type 2 diabetes mellitus with diabetic chronic kidney disease ([place]): Recent A1c 7.0.  Patient was taking Victoza and glargine at home.  CBG within goal. -Continue with glargine and SSI  Hypertension.  Blood pressure within goal. -Continue with Coreg and amlodipine -IV hydralazine as needed  Thrombocytopenia  and pancytopenia: This is chronic issue.  Hemoglobin stable, 9.6 today (9.4 on 07/26/2021) -Follow-up with CBC   COPD (chronic obstructive pulmonary disease) ([place]): Stable -Bronchodilators   Bipolar disorder ([place]) -Zoloft   GERD without esophagitis -Protonix, Carafate   Gout: No symptoms now. -Patient is currently not taking allopurinol   Hyperlipidemia -Crestor   BPH (benign prostatic hyperplasia) -Flomax  Objective: Vitals:  10/18/21 0450 10/18/21 0541 10/18/21 0844 10/18/21 1147  BP: (!) 154/64  (!) 166/70 132/63  Pulse: 67  (!) 59 62  Resp: 18  18 18   Temp: 98.6 F (37 C)   98.5 F (36.9 C) 98.2 F (36.8 C)  TempSrc: Oral     SpO2: 99%  97% 100%  Weight:  99.5 kg    Height:        Intake/Output Summary (Last 24 hours) at 10/18/2021 1320 Last data filed at 10/18/2021 1000 Gross per 24 hour  Intake 2888.75 ml  Output 701 ml  Net 2187.75 ml   Filed Weights   10/17/21 0308 10/17/21 2322 10/18/21 0541  Weight: 100.2 kg 100.2 kg 99.5 kg    Examination:  General exam: Appears calm and comfortable  Respiratory system: Clear to auscultation. Respiratory effort normal. Cardiovascular system: S1 & S2 heard, RRR.  Gastrointestinal system: Soft, nontender, nondistended, bowel sounds positive. Central nervous system: Alert and oriented. No focal neurological deficits. Extremities: No edema, no cyanosis, pulses intact and symmetrical. Psychiatry: Judgement and insight appear normal.    DVT prophylaxis: Subcu heparin Code Status: Heparin Family Communication:  Disposition Plan:  Status is: Inpatient  Remains inpatient appropriate because: Severity of illness   Level of care: Progressive Cardiac  All the records are reviewed and case discussed with Care Management/Social Worker. Management plans discussed with the patient, nursing and they are in agreement.  Consultants:  Cardiology  Procedures:  Antimicrobials:   Data Reviewed: I have personally reviewed following labs and imaging studies  CBC: Recent Labs  Lab 10/17/21 0317  WBC 3.9*  HGB 9.6*  HCT 28.2*  MCV 92.5  PLT 144*   Basic Metabolic Panel: Recent Labs  Lab 10/17/21 0317  NA 135  K 4.2  CL 106  CO2 23  GLUCOSE 277*  BUN 41*  CREATININE 1.57*  CALCIUM 8.4*   GFR: Estimated Creatinine Clearance: 46.8 mL/min (A) (by C-G formula based on SCr of 1.57 mg/dL (H)). Liver Function Tests: Recent Labs  Lab 10/17/21 0317  AST 56*  ALT 44  ALKPHOS 81  BILITOT 0.5  PROT 6.6  ALBUMIN 3.5   No results for input(s): LIPASE, AMYLASE in the last 168 hours. No results for  input(s): AMMONIA in the last 168 hours. Coagulation Profile: Recent Labs  Lab 10/17/21 0443  INR 1.1   Cardiac Enzymes: No results for input(s): CKTOTAL, CKMB, CKMBINDEX, TROPONINI in the last 168 hours. BNP (last 3 results) No results for input(s): PROBNP in the last 8760 hours. HbA1C: Recent Labs    10/17/21 0726  HGBA1C 6.9*   CBG: Recent Labs  Lab 10/17/21 1111 10/17/21 1632 10/17/21 2235 10/18/21 0800 10/18/21 1145  GLUCAP 180* 163* 176* 126* 151*   Lipid Profile: No results for input(s): CHOL, HDL, LDLCALC, TRIG, CHOLHDL, LDLDIRECT in the last 72 hours. Thyroid Function Tests: No results for input(s): TSH, T4TOTAL, FREET4, T3FREE, THYROIDAB in the last 72 hours. Anemia Panel: No results for input(s): VITAMINB12, FOLATE, FERRITIN, TIBC, IRON, RETICCTPCT in the last 72 hours. Sepsis Labs: No results for input(s): PROCALCITON, LATICACIDVEN in the last 168 hours.  Recent Results (from the past 240 hour(s))  Resp Panel by RT-PCR (Flu A&B, Covid) Nasopharyngeal Swab     Status: None   Collection Time: 10/17/21  4:43 AM   Specimen: Nasopharyngeal Swab; Nasopharyngeal(NP) swabs in vial transport medium  Result Value Ref Range Status   SARS Coronavirus 2 by RT PCR NEGATIVE NEGATIVE Final    Comment: (NOTE)  SARS-CoV-2 target nucleic acids are NOT DETECTED.  The SARS-CoV-2 RNA is generally detectable in upper respiratory specimens during the acute phase of infection. The lowest concentration of SARS-CoV-2 viral copies this assay can detect is 138 copies/mL. A negative result does not preclude SARS-Cov-2 infection and should not be used as the sole basis for treatment or other patient management decisions. A negative result may occur with  improper specimen collection/handling, submission of specimen other than nasopharyngeal swab, presence of viral mutation(s) within the areas targeted by this assay, and inadequate number of viral copies(<138 copies/mL). A negative  result must be combined with clinical observations, patient history, and epidemiological information. The expected result is Negative.  Fact Sheet for Patients:  EntrepreneurPulse.com.au  Fact Sheet for Healthcare Providers:  IncredibleEmployment.be  This test is no t yet approved or cleared by the [place] FDA and  has been authorized for detection and/or diagnosis of SARS-CoV-2 by FDA under an Emergency Use Authorization (EUA). This EUA will remain  in effect (meaning this test can be used) for the duration of the COVID-19 declaration under Section 564(b)(1) of the Act, 21 U.S.C.section 360bbb-3(b)(1), unless the authorization is terminated  or revoked sooner.       Influenza A by PCR NEGATIVE NEGATIVE Final   Influenza B by PCR NEGATIVE NEGATIVE Final    Comment: (NOTE) The Xpert Xpress SARS-CoV-2/FLU/RSV plus assay is intended as an aid in the diagnosis of influenza from Nasopharyngeal swab specimens and should not be used as a sole basis for treatment. Nasal washings and aspirates are unacceptable for Xpert Xpress SARS-CoV-2/FLU/RSV testing.  Fact Sheet for Patients: EntrepreneurPulse.com.au  Fact Sheet for Healthcare Providers: IncredibleEmployment.be  This test is not yet approved or cleared by the [place] FDA and has been authorized for detection and/or diagnosis of SARS-CoV-2 by FDA under an Emergency Use Authorization (EUA). This EUA will remain in effect (meaning this test can be used) for the duration of the COVID-19 declaration under Section 564(b)(1) of the Act, 21 U.S.C. section 360bbb-3(b)(1), unless the authorization is terminated or revoked.  Performed at [facility], [address] [place], [place] [address]   CULTURE, BLOOD (ROUTINE X 2) w Reflex to ID Panel     Status: None (Preliminary result)   Collection Time: 10/17/21  2:51 PM   Specimen: BLOOD  Result  Value Ref Range Status   Specimen Description BLOOD BLOOD LEFT WRIST  Final   Special Requests   Final    BOTTLES DRAWN AEROBIC AND ANAEROBIC Blood Culture adequate volume   Culture   Final    NO GROWTH < 24 HOURS Performed at [facility], [address] [place], [place] [address]    Report Status PENDING  Incomplete  CULTURE, BLOOD (ROUTINE X 2) w Reflex to ID Panel     Status: None (Preliminary result)   Collection Time: 10/17/21  2:51 PM   Specimen: BLOOD  Result Value Ref Range Status   Specimen Description BLOOD BLOOD RIGHT ARM  Final   Special Requests   Final    BOTTLES DRAWN AEROBIC AND ANAEROBIC Blood Culture adequate volume   Culture   Final    NO GROWTH < 24 HOURS Performed at [facility], [address], [place], [place] [address]    Report Status PENDING  Incomplete     Radiology Studies: DG Chest 2 View  Result Date: 10/17/2021 CLINICAL DATA:  Weakness, shortness of breath EXAM: CHEST - 2 VIEW COMPARISON:  05/23/2021 FINDINGS: Lungs  are clear.  No pleural effusion or pneumothorax. The heart is top-normal in size. Left subclavian pacemaker. Status post TAVR. Degenerative changes of the visualized thoracolumbar spine. IMPRESSION: Normal chest radiographs. Electronically Signed   By: Julian Hy M.D.   On: 10/17/2021 03:38   CT HEAD WO CONTRAST (5MM)  Result Date: 10/17/2021 CLINICAL DATA:  Dizziness.  Ringing in ears.  Weakness. EXAM: CT HEAD WITHOUT CONTRAST TECHNIQUE: Contiguous axial images were obtained from the base of the skull through the vertex without intravenous contrast. COMPARISON:  05/25/2021 FINDINGS: Brain: No evidence of acute infarction, hemorrhage, hydrocephalus, extra-axial collection or mass lesion/mass effect. There is mild diffuse low-attenuation within the subcortical and periventricular white matter compatible with chronic microvascular disease. Prominence of the sulci and ventricles compatible with brain atrophy.  Vascular: No hyperdense vessel or unexpected calcification. Skull: Normal. Negative for fracture or focal lesion. 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 Sinuses/Orbits: No acute finding. Other: None. IMPRESSION: 1. No acute intracranial abnormalities. 2. Chronic small vessel ischemic disease and brain atrophy. Electronically Signed   By: Kerby Moors M.D.   On: 10/17/2021 11:43    Scheduled Meds:  amLODipine  10 mg Oral Daily   aspirin  81 mg Oral Daily   calcium citrate  1 tablet Oral Daily   carvedilol  6.25 mg Oral BID   cholecalciferol  5,000 Units Oral Daily   gabapentin  100 mg Oral Daily   heparin  5,000 Units Subcutaneous Q8H   insulin aspart  0-5 Units Subcutaneous QHS   insulin aspart  0-9 Units Subcutaneous TID WC   insulin glargine-yfgn  12 Units Subcutaneous QHS   isosorbide mononitrate  30 mg Oral Daily   mometasone-formoterol  2 puff Inhalation BID   multivitamin with minerals  1 tablet Oral Daily   pantoprazole  40 mg Oral Daily   rosuvastatin  40 mg Oral Daily   sertraline  200 mg Oral Daily   sucralfate  1 g Oral QID   tamsulosin  0.4 mg Oral Daily   Continuous Infusions:   LOS: 1 day   Time spent: 45 minutes. More than 50% of the time was spent in counseling/coordination of care  Lorella Nimrod, MD [facility]  If 7PM-7AM, please contact night-coverage [email or web address]  10/18/2021, 1:20 PM   This record has been created using Systems analyst. Errors have been sought and corrected,but may not always be located. Such creation errors do not reflect on the standard of care.

## 2021-10-19 LAB — RENAL FUNCTION PANEL
Albumin: 3.1 g/dL — ABNORMAL LOW (ref 3.5–5.0)
Anion gap: 5 (ref 5–15)
BUN: 39 mg/dL — ABNORMAL HIGH (ref 8–23)
CO2: 26 mmol/L (ref 22–32)
Calcium: 8.7 mg/dL — ABNORMAL LOW (ref 8.9–10.3)
Chloride: 107 mmol/L (ref 98–111)
Creatinine, Ser: 1.43 mg/dL — ABNORMAL HIGH (ref 0.61–1.24)
GFR, Estimated: 50 mL/min — ABNORMAL LOW (ref >60)
Glucose, Bld: 184 mg/dL — ABNORMAL HIGH (ref 70–99)
Phosphorus: 2.6 mg/dL (ref 2.5–4.6)
Potassium: 4.3 mmol/L (ref 3.5–5.1)
Sodium: 138 mmol/L (ref 135–145)

## 2021-10-19 LAB — CBC
HCT: 27.4 % — ABNORMAL LOW (ref 39.0–52.0)
Hemoglobin: 9.4 g/dL — ABNORMAL LOW (ref 13.0–17.0)
MCH: 32 pg (ref 26.0–34.0)
MCHC: 34.3 g/dL (ref 30.0–36.0)
MCV: 93.2 fL (ref 80.0–100.0)
Platelets: 106 10*3/uL — ABNORMAL LOW (ref 150–400)
RBC: 2.94 MIL/uL — ABNORMAL LOW (ref 4.22–5.81)
RDW: 13.5 % (ref 11.5–15.5)
WBC: 2.8 10*3/uL — ABNORMAL LOW (ref 4.0–10.5)
nRBC: 0 % (ref 0.0–0.2)

## 2021-10-19 LAB — GLUCOSE, CAPILLARY
Glucose-Capillary: 188 mg/dL — ABNORMAL HIGH (ref 70–99)
Glucose-Capillary: 225 mg/dL — ABNORMAL HIGH (ref 70–99)

## 2021-10-19 NOTE — Discharge Summary (Signed)
Physician Discharge Summary  Sten Dematteo NOB:[id number] DOB: 1941/11/09 DOA: 10/17/2021  PCP: Ranae Plumber, PA  Admit date: 10/17/2021 Discharge date: 10/19/2021  Admitted From: Home Disposition: Home  Recommendations for Outpatient Follow-up:  Follow up with PCP in 1-2 weeks Please obtain BMP/CBC in one week Please follow up on the following pending results: None  Home Health: Yes Equipment/Devices: Rolling walker Discharge Condition: Stable CODE STATUS: Full Diet recommendation: Heart Healthy / Carb Modified   Brief/Interim Summary: Nox Talent is a 80 y.o. male with medical history significant of CAD with stent placement,HTN, HLD, DM, COPD, GERD, gout, depression,ss/p of TAVR with bioprosthesis 2019, SSS, s/p of pacemaker placement, PVD, GI bleeding, hard of hearing, ascending aortic aneurysm, varicose vein, pancytopenia, thrombocytopenia, right bundle blockage, anemia, CKD-3A, BPH, who presents with weakness, palpitation and SOB. CT head was negative for any acute changes.  He was on DAPT at home and stopped taking Plavix a day prior to the admission due to a procedure scheduled for his back on November 11. Found to have mild AKI and appears little dry.  No other abnormality noted.  Mildly elevated troponin most likely secondary to demand ischemia, not in NSTEMI range. Blood cultures remain negative in 2 days.  Cardiology was also consulted as there was some concern of vegetation during his prior MSSA bacteremia.  No need for repeat echocardiogram if blood cultures remain negative. 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  Patient was found to have AKI with CKD stage IIIa.  Baseline creatinine around 1.1-1.2.  Started improving with some IV fluid, he was encouraged to keep p.o. hydration and can follow-up with primary care provider in a week for repeat renal function.  Our physical therapist evaluated  him for his concern of generalized weakness and they were recommending home health services which were ordered.  Patient also has an history of pancytopenia and seems stable.  Patient will continue the rest of his home medications and follow-up with his provider as an outpatient.  Discharge Diagnoses:  Principal Problem:   NSTEMI (non-ST elevated myocardial infarction) ([place]) Active Problems:   HTN (hypertension)   Thrombocytopenia (HCC)   COPD (chronic obstructive pulmonary disease) (HCC)   Bipolar disorder (HCC)   GERD without esophagitis   Gout   Hyperlipidemia   BPH (benign prostatic hyperplasia)   Dizziness   Weakness   Type 2 diabetes mellitus with diabetic chronic kidney disease (HCC)   Pancytopenia (HCC)   CAD (coronary artery disease)   Acute renal failure superimposed on stage 3a chronic kidney disease (HCC)   Elevated troponin    Discharge Instructions  Discharge Instructions     Diet - low sodium heart healthy   Complete by: As directed    Discharge instructions   Complete by: As directed    It was pleasure taking care of you. Please keep yourself well-hydrated and continue taking your medications. Follow-up with your doctors.   Increase activity slowly   Complete by: As directed       Allergies as of 10/19/2021       Reactions   Atorvastatin Other (See Comments)   "bad for kidneys" Cramping   Celecoxib Other (See Comments)   Other reaction(s): Other (See Comments) Kidney Problem Kidney Problem   Dicyclomine Hcl Other (See Comments)   Stomach cramps   Doxycycline Other (See Comments)   Per wife (per RN)- pt gets severe stomach pains even when takes with food  and PCP said to not take   Metformin Other (See Comments)        Medication List     TAKE these medications    acetaminophen 325 MG tablet Commonly known as: TYLENOL Take 2 tablets (650 mg total) by mouth every 6 (six) hours as needed for fever, headache or moderate pain.    albuterol 108 (90 Base) MCG/ACT inhaler Commonly known as: VENTOLIN HFA Inhale 2 puffs into the lungs every 4 (four) hours as needed for wheezing or shortness of breath.   allopurinol 100 MG tablet Commonly known as: ZYLOPRIM Take 100 mg by mouth daily.   amLODipine 10 MG tablet Commonly known as: NORVASC Take 1 tablet (10 mg total) by mouth daily.   Bayer Aspirin EC Low Dose 81 MG EC tablet Generic drug: aspirin Take 81 mg by mouth daily.   Calcium Citrate+D3 Petites 200-6.25 MG-MCG Tabs Generic drug: Calcium Citrate-Vitamin D Take by mouth.   carvedilol 6.25 MG tablet Commonly known as: COREG Take 6.25 mg by mouth 2 (two) times daily.   clopidogrel 75 MG tablet Commonly known as: PLAVIX Take 1 tablet (75 mg total) by mouth daily.   gabapentin 100 MG capsule Commonly known as: NEURONTIN Take 100 mg by mouth daily.   insulin glargine-yfgn 100 UNIT/ML Pen Commonly known as: SEMGLEE Inject 18 Units into the skin at bedtime.   isosorbide mononitrate 30 MG 24 hr tablet Commonly known as: IMDUR Take 30 mg by mouth daily.   lactulose (encephalopathy) 10 GM/15ML Soln Commonly known as: CHRONULAC Take 15 mLs by mouth 2 (two) times daily.   loperamide 2 MG capsule Commonly known as: IMODIUM Take 1 capsule (2 mg total) by mouth as needed for diarrhea or loose stools.   MiraLax 17 GM/SCOOP powder Generic drug: polyethylene glycol powder Take by mouth.   multivitamin Tabs tablet Take 1 tablet by mouth daily.   pantoprazole 40 MG tablet Commonly known as: PROTONIX Take 1 tablet (40 mg total) by mouth daily.   rosuvastatin 40 MG tablet Commonly known as: CRESTOR Take 40 mg by mouth daily.   sertraline 100 MG tablet Commonly known as: ZOLOFT Take 200 mg by mouth daily.   sucralfate 1 g tablet Commonly known as: CARAFATE Take 1 g by mouth 4 (four) times daily.   Symbicort 80-4.5 MCG/ACT inhaler Generic drug: budesonide-formoterol Inhale 2 puffs into the  lungs 2 (two) times daily.   tamsulosin 0.4 MG Caps capsule Commonly known as: FLOMAX Take 0.4 mg by mouth daily.   Victoza 18 MG/3ML Sopn Generic drug: liraglutide Inject 1.8 mg into the skin daily.   Vitamin D3 125 MCG (5000 UT) Caps Take 5,000 Units by mouth daily.        Follow-up Information     Ranae Plumber, [place]. Schedule an appointment as soon as possible for a visit in 1 week(s).   Specialty: Family Medicine Contact information: [place] [address] [phone]                Allergies  Allergen Reactions   Atorvastatin Other (See Comments)    "bad for kidneys" Cramping    Celecoxib Other (See Comments)    Other reaction(s): Other (See Comments) Kidney Problem Kidney Problem    Dicyclomine Hcl Other (See Comments)    Stomach cramps   Doxycycline Other (See Comments)    Per wife (per RN)- pt gets severe stomach pains even when takes with food and PCP said to not take  Metformin Other (See Comments)    Consultations: Cardiology  Procedures/Studies: DG Chest 2 View  Result Date: 10/17/2021 CLINICAL DATA:  Weakness, shortness of breath EXAM: CHEST - 2 VIEW COMPARISON:  05/23/2021 FINDINGS: Lungs are clear.  No pleural effusion or pneumothorax. The heart is top-normal in size. Left subclavian pacemaker. Status post TAVR. Degenerative changes of the visualized thoracolumbar spine. IMPRESSION: Normal chest radiographs. Electronically Signed   By: Julian Hy M.D.   On: 10/17/2021 03:38   CT HEAD WO CONTRAST (5MM)  Result Date: 10/17/2021 CLINICAL DATA:  Dizziness.  Ringing in ears.  Weakness. EXAM: CT HEAD WITHOUT CONTRAST TECHNIQUE: Contiguous axial images were obtained from the base of the skull through the vertex without intravenous contrast. COMPARISON:  05/25/2021 FINDINGS: Brain: No evidence of acute infarction, hemorrhage, hydrocephalus, extra-axial collection or mass lesion/mass effect. There is mild diffuse  low-attenuation within the subcortical and periventricular white matter compatible with chronic microvascular disease. Prominence of the sulci and ventricles compatible with brain atrophy. Vascular: No hyperdense vessel or unexpected calcification. Skull: Normal. Negative for fracture or focal lesion. 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 Sinuses/Orbits: No acute finding. Other: None. IMPRESSION: 1. No acute intracranial abnormalities. 2. Chronic small vessel ischemic disease and brain atrophy. Electronically Signed   By: Kerby Moors M.D.   On: 10/17/2021 11:43   DG PAIN CLINIC C-ARM 1-60 MIN NO REPORT  Result Date: 09/25/2021 Fluoro was used, but no Radiologist interpretation will be provided. Please refer to "NOTES" tab for provider progress note.   Subjective: Patient was seen and examined today.  No new complaints.  Ready to go home.  Discharge Exam: Vitals:   10/19/21 0439 10/19/21 0731  BP: (!) 147/67 (!) 170/81  Pulse: 60 (!) 59  Resp: 18 18  Temp: 97.9 F (36.6 C) 97.8 F (36.6 C)  SpO2: 98% 100%   Vitals:   10/18/21 2023 10/19/21 0010 10/19/21 0439 10/19/21 0731  BP: (!) 154/77 (!) 173/71 (!) 147/67 (!) 170/81  Pulse: (!) 58 62 60 (!) 59  Resp: 19 18 18 18   Temp: 98.4 F (36.9 C) 97.6 F (36.4 C) 97.9 F (36.6 C) 97.8 F (36.6 C)  TempSrc:      SpO2: 100% 99% 98% 100%  Weight:      Height:        General: Pt is alert, awake, not in acute distress Cardiovascular: RRR, S1/S2 +, no rubs, no gallops Respiratory: CTA bilaterally, no wheezing, no rhonchi Abdominal: Soft, NT, ND, bowel sounds + Extremities: no edema, no cyanosis   The results of significant diagnostics from this hospitalization (including imaging, microbiology, ancillary and laboratory) are listed below for reference.    Microbiology: Recent Results (from the past 240 hour(s))  Resp Panel by RT-PCR (Flu A&B, Covid) Nasopharyngeal Swab     Status:  None   Collection Time: 10/17/21  4:43 AM   Specimen: Nasopharyngeal Swab; Nasopharyngeal(NP) swabs in vial transport medium  Result Value Ref Range Status   SARS Coronavirus 2 by RT PCR NEGATIVE NEGATIVE Final    Comment: (NOTE) SARS-CoV-2 target nucleic acids are NOT DETECTED.  The SARS-CoV-2 RNA is generally detectable in upper respiratory specimens during the acute phase of infection. The lowest concentration of SARS-CoV-2 viral copies this assay can detect is 138 copies/mL. A negative result does not preclude SARS-Cov-2 infection and should not be used as the sole basis for treatment or other patient management decisions. A negative result may occur with  improper specimen collection/handling, submission of specimen other than nasopharyngeal swab, presence of viral mutation(s) within the areas targeted by this assay, and inadequate number of viral copies(<138 copies/mL). A negative result must be combined with clinical observations, patient history, and epidemiological information. The expected result is Negative.  Fact Sheet for Patients:  EntrepreneurPulse.com.au  Fact Sheet for Healthcare Providers:  IncredibleEmployment.be  This test is no t yet approved or cleared by the [place] FDA and  has been authorized for detection and/or diagnosis of SARS-CoV-2 by FDA under an Emergency Use Authorization (EUA). This EUA will remain  in effect (meaning this test can be used) for the duration of the COVID-19 declaration under Section 564(b)(1) of the Act, 21 U.S.C.section 360bbb-3(b)(1), unless the authorization is terminated  or revoked sooner.       Influenza A by PCR NEGATIVE NEGATIVE Final   Influenza B by PCR NEGATIVE NEGATIVE Final    Comment: (NOTE) The Xpert Xpress SARS-CoV-2/FLU/RSV plus assay is intended as an aid in the diagnosis of influenza from Nasopharyngeal swab specimens and should not be used as a sole basis for  treatment. Nasal washings and aspirates are unacceptable for Xpert Xpress SARS-CoV-2/FLU/RSV testing.  Fact Sheet for Patients: EntrepreneurPulse.com.au  Fact Sheet for Healthcare Providers: IncredibleEmployment.be  This test is not yet approved or cleared by the [place] FDA and has been authorized for detection and/or diagnosis of SARS-CoV-2 by FDA under an Emergency Use Authorization (EUA). This EUA will remain in effect (meaning this test can be used) for the duration of the COVID-19 declaration under Section 564(b)(1) of the Act, 21 U.S.C. section 360bbb-3(b)(1), unless the authorization is terminated or revoked.  Performed at [facility], [address] [place], [place] [address]   CULTURE, BLOOD (ROUTINE X 2) w Reflex to ID Panel     Status: None (Preliminary result)   Collection Time: 10/17/21  2:51 PM   Specimen: BLOOD  Result Value Ref Range Status   Specimen Description BLOOD BLOOD LEFT WRIST  Final   Special Requests   Final    BOTTLES DRAWN AEROBIC AND ANAEROBIC Blood Culture adequate volume   Culture   Final    NO GROWTH 2 DAYS Performed at [facility], [address] [place], [place] [address]    Report Status PENDING  Incomplete  CULTURE, BLOOD (ROUTINE X 2) w Reflex to ID Panel     Status: None (Preliminary result)   Collection Time: 10/17/21  2:51 PM   Specimen: BLOOD  Result Value Ref Range Status   Specimen Description BLOOD BLOOD RIGHT ARM  Final   Special Requests   Final    BOTTLES DRAWN AEROBIC AND ANAEROBIC Blood Culture adequate volume   Culture   Final    NO GROWTH 2 DAYS Performed at [facility], [address] [place], [place] [address]    Report Status PENDING  Incomplete     Labs: BNP (last 3 results) Recent Labs    03/20/21 0346 05/06/21 0150 10/17/21 0726  BNP 155.6* 118.7* 51.7   Basic Metabolic Panel: Recent Labs  Lab 10/17/21 0317 10/19/21 0529   NA 135 138  K 4.2 4.3  CL 106 107  CO2 23 26  GLUCOSE 277* 184*  BUN 41* 39*  CREATININE 1.57* 1.43*  CALCIUM 8.4* 8.7*  PHOS  --  2.6   Liver Function Tests: Recent Labs  Lab 10/17/21 0317 10/19/21 0529  AST 56*  --   ALT 44  --  ALKPHOS 81  --   BILITOT 0.5  --   PROT 6.6  --   ALBUMIN 3.5 3.1*   No results for input(s): LIPASE, AMYLASE in the last 168 hours. No results for input(s): AMMONIA in the last 168 hours. CBC: Recent Labs  Lab 10/17/21 0317 10/19/21 0529  WBC 3.9* 2.8*  HGB 9.6* 9.4*  HCT 28.2* 27.4*  MCV 92.5 93.2  PLT 102* 106*   Cardiac Enzymes: No results for input(s): CKTOTAL, CKMB, CKMBINDEX, TROPONINI in the last 168 hours. BNP: Invalid input(s): POCBNP CBG: Recent Labs  Lab 10/18/21 0800 10/18/21 1145 10/18/21 1609 10/18/21 2133 10/19/21 0728  GLUCAP 126* 151* 205* 143* 188*   D-Dimer No results for input(s): DDIMER in the last 72 hours. Hgb A1c Recent Labs    10/17/21 0726  HGBA1C 6.9*   Lipid Profile No results for input(s): CHOL, HDL, LDLCALC, TRIG, CHOLHDL, LDLDIRECT in the last 72 hours. Thyroid function studies No results for input(s): TSH, T4TOTAL, T3FREE, THYROIDAB in the last 72 hours.  Invalid input(s): FREET3 Anemia work up No results for input(s): VITAMINB12, FOLATE, FERRITIN, TIBC, IRON, RETICCTPCT in the last 72 hours. Urinalysis    Component Value Date/Time   COLORURINE STRAW (A) 10/17/2021 0800   APPEARANCEUR CLEAR (A) 10/17/2021 0800   LABSPEC 1.005 10/17/2021 0800   PHURINE 5.0 10/17/2021 0800   GLUCOSEU 50 (A) 10/17/2021 0800   HGBUR NEGATIVE 10/17/2021 0800   BILIRUBINUR NEGATIVE 10/17/2021 0800   KETONESUR NEGATIVE 10/17/2021 0800   PROTEINUR 30 (A) 10/17/2021 0800   NITRITE NEGATIVE 10/17/2021 0800   LEUKOCYTESUR NEGATIVE 10/17/2021 0800   Sepsis Labs Invalid input(s): PROCALCITONIN,  WBC,  LACTICIDVEN Microbiology Recent Results (from the past 240 hour(s))  Resp Panel by RT-PCR (Flu A&B,  Covid) Nasopharyngeal Swab     Status: None   Collection Time: 10/17/21  4:43 AM   Specimen: Nasopharyngeal Swab; Nasopharyngeal(NP) swabs in vial transport medium  Result Value Ref Range Status   SARS Coronavirus 2 by RT PCR NEGATIVE NEGATIVE Final    Comment: (NOTE) SARS-CoV-2 target nucleic acids are NOT DETECTED.  The SARS-CoV-2 RNA is generally detectable in upper respiratory specimens during the acute phase of infection. The lowest concentration of SARS-CoV-2 viral copies this assay can detect is 138 copies/mL. A negative result does not preclude SARS-Cov-2 infection and should not be used as the sole basis for treatment or other patient management decisions. A negative result may occur with  improper specimen collection/handling, submission of specimen other than nasopharyngeal swab, presence of viral mutation(s) within the areas targeted by this assay, and inadequate number of viral copies(<138 copies/mL). A negative result must be combined with clinical observations, patient history, and epidemiological information. The expected result is Negative.  Fact Sheet for Patients:  EntrepreneurPulse.com.au  Fact Sheet for Healthcare Providers:  IncredibleEmployment.be  This test is no t yet approved or cleared by the [place] FDA and  has been authorized for detection and/or diagnosis of SARS-CoV-2 by FDA under an Emergency Use Authorization (EUA). This EUA will remain  in effect (meaning this test can be used) for the duration of the COVID-19 declaration under Section 564(b)(1) of the Act, 21 U.S.C.section 360bbb-3(b)(1), unless the authorization is terminated  or revoked sooner.       Influenza A by PCR NEGATIVE NEGATIVE Final   Influenza B by PCR NEGATIVE NEGATIVE Final    Comment: (NOTE) The Xpert Xpress SARS-CoV-2/FLU/RSV plus assay is intended as an aid in the diagnosis of influenza from Nasopharyngeal  swab specimens and should  not be used as a sole basis for treatment. Nasal washings and aspirates are unacceptable for Xpert Xpress SARS-CoV-2/FLU/RSV testing.  Fact Sheet for Patients: EntrepreneurPulse.com.au  Fact Sheet for Healthcare Providers: IncredibleEmployment.be  This test is not yet approved or cleared by the [place] FDA and has been authorized for detection and/or diagnosis of SARS-CoV-2 by FDA under an Emergency Use Authorization (EUA). This EUA will remain in effect (meaning this test can be used) for the duration of the COVID-19 declaration under Section 564(b)(1) of the Act, 21 U.S.C. section 360bbb-3(b)(1), unless the authorization is terminated or revoked.  Performed at [facility], [address] [place], [place] [address]   CULTURE, BLOOD (ROUTINE X 2) w Reflex to ID Panel     Status: None (Preliminary result)   Collection Time: 10/17/21  2:51 PM   Specimen: BLOOD  Result Value Ref Range Status   Specimen Description BLOOD BLOOD LEFT WRIST  Final   Special Requests   Final    BOTTLES DRAWN AEROBIC AND ANAEROBIC Blood Culture adequate volume   Culture   Final    NO GROWTH 2 DAYS Performed at [facility], [address] [place], [place] [address]    Report Status PENDING  Incomplete  CULTURE, BLOOD (ROUTINE X 2) w Reflex to ID Panel     Status: None (Preliminary result)   Collection Time: 10/17/21  2:51 PM   Specimen: BLOOD  Result Value Ref Range Status   Specimen Description BLOOD BLOOD RIGHT ARM  Final   Special Requests   Final    BOTTLES DRAWN AEROBIC AND ANAEROBIC Blood Culture adequate volume   Culture   Final    NO GROWTH 2 DAYS Performed at [facility], [address], [place], [place] [address]    Report Status PENDING  Incomplete    Time coordinating discharge: Over 30 minutes  SIGNED:  Lorella Nimrod, MD  [facility] 10/19/2021, 11:22 AM  If 7PM-7AM, please contact  night-coverage [email or web address]  This record has been created using Systems analyst. Errors have been sought and corrected,but may not always be located. Such creation errors do not reflect on the standard of care.

## 2021-10-19 NOTE — TOC Progression Note (Addendum)
Transition of Care [facility]) - Progression Note    Patient Details  Name: Nathan Singleton MRN: [id number] Date of Birth: 04-24-1941  Transition of Care [facility]) CM/SW Contact  Izola Price, RN Phone Number: 10/19/2021, 4:10 PM  Clinical Narrative:  Patient was in service with [facility] but was discharged in September 2022 and they will not pick up again. UHC dual complete is not in network for many agencies. [facility] also denied. Updated provider as wanting to discharge pending securing [facility] agency. Patient may have to go through PCP or set up outpatient therapy per PCP if unable to secure agency from [facility]. Hand note left for weekday CM. Simmie Davies RN CM    [facility] denied resumption. [facility]-out of network.  [facility]-denied ins.  [facility]-out of network.  VM left at others pending return call.  Patient discharged to home.  Simmie Davies RN CM   Expected Discharge Plan: [place] Barriers to Discharge: Barriers Resolved  Expected Discharge Plan and Services Expected Discharge Plan: [place]       Living arrangements for the past 2 months: Single Family Home Expected Discharge Date: 10/19/21                                     Social Determinants of Health (SDOH) Interventions    Readmission Risk Interventions Readmission Risk Prevention Plan 05/07/2021 03/23/2021 02/14/2021  Transportation Screening Complete Complete Complete  Medication Review [employer] photographer) Complete Complete Complete  PCP or Specialist appointment within 3-5 days of discharge Complete Complete Complete  HRI or Home Care Consult Complete Complete Complete  SW Recovery Care/Counseling Consult Complete Complete Complete  Palliative Care Screening Not Applicable Not Applicable Complete  [place] Not Applicable Not Applicable Complete

## 2021-10-19 NOTE — TOC Transition Note (Signed)
Transition of Care [facility]) - CM/SW Discharge Note   Patient Details  Name: Nathan Singleton MRN: [id number] Date of Birth: [birth year]  Transition of Care [facility]) CM/SW Contact:  Izola Price, RN Phone Number: 10/19/2021, 4:38 PM   Clinical Narrative:  Patient was in service with [facility] but was discharged in September 2022 and they will not pick up again. UHC dual complete is not in network for many agencies. [facility] also denied. Updated provider as wanting to discharge pending securing [facility] agency. Patient may have to go through PCP or set up outpatient therapy per PCP if unable to secure agency from [facility]. Hand note left for weekday CM. Simmie Davies RN CM     [facility] denied resumption. [facility]-out of network.  [facility]-denied ins.  [facility]-out of network.  VM left at others pending return call.  Patient discharged to home.  Simmie Davies RN CM        Final next level of care: Home w Home Health Services Barriers to Discharge: Other (must enter comment) (Provider aware no HH secured due to insurance out of network with most agencies in area.)   Patient Goals and CMS Choice   CMS Medicare.gov Compare Post Acute Care list provided to:: Patient Choice offered to / list presented to : Patient  Discharge Placement                       Discharge Plan and Services                                     Social Determinants of Health (SDOH) Interventions     Readmission Risk Interventions Readmission Risk Prevention Plan 05/07/2021 03/23/2021 02/14/2021  Transportation Screening Complete Complete Complete  Medication Review Press photographer) Complete Complete Complete  PCP or Specialist appointment within 3-5 days of discharge Complete Complete Complete  HRI or Home Care Consult Complete Complete Complete  SW Recovery Care/Counseling Consult Complete Complete Complete  Palliative Care Screening Not Applicable Not Applicable Complete  [place] Not Applicable Not Applicable Complete

## 2021-10-22 ENCOUNTER — Ambulatory Visit: Payer: 59 | Admitting: Student in an Organized Health Care Education/Training Program

## 2021-10-22 LAB — CULTURE, BLOOD (ROUTINE X 2)
Culture: NO GROWTH
Culture: NO GROWTH
Special Requests: ADEQUATE
Special Requests: ADEQUATE

## 2021-10-30 ENCOUNTER — Emergency Department: Payer: 59

## 2021-10-30 ENCOUNTER — Other Ambulatory Visit: Payer: Self-pay

## 2021-10-30 ENCOUNTER — Emergency Department
Admission: EM | Admit: 2021-10-30 | Discharge: 2021-10-30 | Disposition: A | Discharge status: Home / Self Care | Payer: 59 | Source: Home / Self Care | Attending: Emergency Medicine | Admitting: Emergency Medicine

## 2021-10-30 DIAGNOSIS — R509 Fever, unspecified: Secondary | ICD-10-CM | POA: Diagnosis not present

## 2021-10-30 DIAGNOSIS — Z20822 Contact with and (suspected) exposure to covid-19: Secondary | ICD-10-CM | POA: Insufficient documentation

## 2021-10-30 DIAGNOSIS — Z950 Presence of cardiac pacemaker: Secondary | ICD-10-CM | POA: Insufficient documentation

## 2021-10-30 DIAGNOSIS — L03113 Cellulitis of right upper limb: Secondary | ICD-10-CM | POA: Insufficient documentation

## 2021-10-30 DIAGNOSIS — T827XXA Infection and inflammatory reaction due to other cardiac and vascular devices, implants and grafts, initial encounter: Secondary | ICD-10-CM | POA: Diagnosis not present

## 2021-10-30 DIAGNOSIS — I25119 Atherosclerotic heart disease of native coronary artery with unspecified angina pectoris: Secondary | ICD-10-CM | POA: Insufficient documentation

## 2021-10-30 DIAGNOSIS — Z96651 Presence of right artificial knee joint: Secondary | ICD-10-CM | POA: Insufficient documentation

## 2021-10-30 DIAGNOSIS — Z794 Long term (current) use of insulin: Secondary | ICD-10-CM | POA: Insufficient documentation

## 2021-10-30 DIAGNOSIS — N1831 Chronic kidney disease, stage 3a: Secondary | ICD-10-CM | POA: Insufficient documentation

## 2021-10-30 DIAGNOSIS — E1122 Type 2 diabetes mellitus with diabetic chronic kidney disease: Secondary | ICD-10-CM | POA: Insufficient documentation

## 2021-10-30 DIAGNOSIS — R0602 Shortness of breath: Secondary | ICD-10-CM | POA: Insufficient documentation

## 2021-10-30 DIAGNOSIS — R1030 Lower abdominal pain, unspecified: Secondary | ICD-10-CM | POA: Insufficient documentation

## 2021-10-30 DIAGNOSIS — Z79899 Other long term (current) drug therapy: Secondary | ICD-10-CM | POA: Insufficient documentation

## 2021-10-30 DIAGNOSIS — J449 Chronic obstructive pulmonary disease, unspecified: Secondary | ICD-10-CM | POA: Insufficient documentation

## 2021-10-30 DIAGNOSIS — L03114 Cellulitis of left upper limb: Secondary | ICD-10-CM | POA: Insufficient documentation

## 2021-10-30 DIAGNOSIS — R350 Frequency of micturition: Secondary | ICD-10-CM | POA: Insufficient documentation

## 2021-10-30 DIAGNOSIS — Z87891 Personal history of nicotine dependence: Secondary | ICD-10-CM | POA: Insufficient documentation

## 2021-10-30 DIAGNOSIS — I129 Hypertensive chronic kidney disease with stage 1 through stage 4 chronic kidney disease, or unspecified chronic kidney disease: Secondary | ICD-10-CM | POA: Insufficient documentation

## 2021-10-30 DIAGNOSIS — K219 Gastro-esophageal reflux disease without esophagitis: Secondary | ICD-10-CM | POA: Insufficient documentation

## 2021-10-30 DIAGNOSIS — L03119 Cellulitis of unspecified part of limb: Secondary | ICD-10-CM

## 2021-10-30 LAB — BLOOD CULTURE ID PANEL (REFLEXED) - BCID2

## 2021-10-30 LAB — COMPREHENSIVE METABOLIC PANEL
ALT: 38 U/L (ref 0–44)
AST: 53 U/L — ABNORMAL HIGH (ref 15–41)
Albumin: 3.8 g/dL (ref 3.5–5.0)
Alkaline Phosphatase: 87 U/L (ref 38–126)
Anion gap: 6 (ref 5–15)
BUN: 33 mg/dL — ABNORMAL HIGH (ref 8–23)
CO2: 24 mmol/L (ref 22–32)
Calcium: 8.4 mg/dL — ABNORMAL LOW (ref 8.9–10.3)
Chloride: 104 mmol/L (ref 98–111)
Creatinine, Ser: 1.42 mg/dL — ABNORMAL HIGH (ref 0.61–1.24)
GFR, Estimated: 50 mL/min — ABNORMAL LOW (ref >60)
Glucose, Bld: 102 mg/dL — ABNORMAL HIGH (ref 70–99)
Potassium: 4.4 mmol/L (ref 3.5–5.1)
Sodium: 134 mmol/L — ABNORMAL LOW (ref 135–145)
Total Bilirubin: 0.7 mg/dL (ref 0.3–1.2)
Total Protein: 7.1 g/dL (ref 6.5–8.1)

## 2021-10-30 LAB — CBC WITH DIFFERENTIAL/PLATELET
Abs Immature Granulocytes: 0.03 10*3/uL (ref 0.00–0.07)
Basophils Absolute: 0 10*3/uL (ref 0.0–0.1)
Basophils Relative: 0 %
Eosinophils Absolute: 0 10*3/uL (ref 0.0–0.5)
Eosinophils Relative: 0 %
HCT: 30.5 % — ABNORMAL LOW (ref 39.0–52.0)
Hemoglobin: 10.2 g/dL — ABNORMAL LOW (ref 13.0–17.0)
Immature Granulocytes: 1 %
Lymphocytes Relative: 3 %
Lymphs Abs: 0.2 10*3/uL — ABNORMAL LOW (ref 0.7–4.0)
MCH: 31.4 pg (ref 26.0–34.0)
MCHC: 33.4 g/dL (ref 30.0–36.0)
MCV: 93.8 fL (ref 80.0–100.0)
Monocytes Absolute: 0.2 10*3/uL (ref 0.1–1.0)
Monocytes Relative: 4 %
Neutro Abs: 4.9 10*3/uL (ref 1.7–7.7)
Neutrophils Relative %: 92 %
Platelets: 110 10*3/uL — ABNORMAL LOW (ref 150–400)
RBC: 3.25 MIL/uL — ABNORMAL LOW (ref 4.22–5.81)
RDW: 13.4 % (ref 11.5–15.5)
WBC: 5.4 10*3/uL (ref 4.0–10.5)
nRBC: 0 % (ref 0.0–0.2)

## 2021-10-30 LAB — URINALYSIS, ROUTINE W REFLEX MICROSCOPIC
Bacteria, UA: NONE SEEN
Bilirubin Urine: NEGATIVE
Glucose, UA: NEGATIVE mg/dL
Hgb urine dipstick: NEGATIVE
Ketones, ur: NEGATIVE mg/dL
Leukocytes,Ua: NEGATIVE
Nitrite: NEGATIVE
Protein, ur: 100 mg/dL — AB
Specific Gravity, Urine: 1.009 (ref 1.005–1.030)
Squamous Epithelial / HPF: NONE SEEN (ref 0–5)
pH: 5 (ref 5.0–8.0)

## 2021-10-30 LAB — RESP PANEL BY RT-PCR (FLU A&B, COVID) ARPGX2
Influenza A by PCR: NEGATIVE
Influenza B by PCR: NEGATIVE
SARS Coronavirus 2 by RT PCR: NEGATIVE

## 2021-10-30 LAB — PROTIME-INR
INR: 1.1 (ref 0.8–1.2)
Prothrombin Time: 14.2 seconds (ref 11.4–15.2)

## 2021-10-30 LAB — LACTIC ACID, PLASMA: Lactic Acid, Venous: 1.1 mmol/L (ref 0.5–1.9)

## 2021-10-30 MED ORDER — CEPHALEXIN 500 MG PO CAPS: 500.0000 mg | ORAL_CAPSULE | Freq: Four times a day (QID) | ORAL | 0 refills | Status: DC

## 2021-10-30 MED ORDER — IOHEXOL 300 MG/ML  SOLN
100.0000 mL | Freq: Once | INTRAMUSCULAR | Status: AC | PRN
  Administered 2021-10-30: 13:00:00 100 mL via INTRAVENOUS

## 2021-10-30 MED ORDER — ACETAMINOPHEN 500 MG PO TABS
1000.0000 mg | ORAL_TABLET | Freq: Once | ORAL | Status: AC
  Administered 2021-10-30: 10:00:00 1000 mg via ORAL
  Filled 2021-10-30: qty 2

## 2021-10-30 NOTE — ED Triage Notes (Signed)
Pt here with a fever. Pt states that he has been having fevers for a few days and believes that his left arm may be infected from a prior skin tear. Pt A&O in NAD in triage.

## 2021-10-30 NOTE — ED Triage Notes (Signed)
Pt brought in by [facility] c/o abd pain, SHOB, fever, tachy, and urinating more frequently that normal at night. CBG 178. Was discharged on 10/19/21 for heart problems. Pt has bandages on both of his forearms because he reports that his skin tears are becoming infected. Pt is HOH.

## 2021-10-30 NOTE — ED Provider Notes (Signed)
[facility] Emergency Department Provider Note ____________________________________________   Event Date/Time   First MD Initiated Contact with Patient 10/30/21 1153     (approximate)  I have reviewed the triage vital signs and the nursing notes.   HISTORY  Chief Complaint Fever    HPI Nathan Singleton is a 80 y.o. male with PMH as noted below including CAD, DM, PVD, CKD, and hypertension who presents with multiple complaints, but specifically fever and bilateral arm pain.  The patient has had some skin tears over the last few weeks to both arms.  These are healing, but have been slightly more painful than usual and he has noted some oozing of blood from them which stops when pressure is applied or dressing is in place.  In addition the patient noted a fever today.  He has also had some lower abdominal pain and urinary frequency over the last couple of days, as well as some shortness of breath with exertion.  He denies cough, chest pain, vomiting or diarrhea, or other acute symptoms.    Past Medical History:  Diagnosis Date   Aneurysm (arteriovenous) of coronary vessels    Arthritis    Ascending aortic aneurysm    CAD (coronary artery disease)    Diabetes mellitus without complication (HCC)    Hard of hearing    History of GI bleed    Hyperlipidemia associated with type 2 diabetes mellitus ([place])    Hypertension associated with diabetes ([place])    Presence of permanent cardiac pacemaker    PVD (peripheral vascular disease) ([place])    S/P TAVR (transcatheter aortic valve replacement)     Patient Active Problem List   Diagnosis Date Noted   Elevated troponin    NSTEMI (non-ST elevated myocardial infarction) ([place]) 10/17/2021   Pancytopenia ([place]) 10/17/2021   CAD (coronary artery disease) 10/17/2021   Acute renal failure superimposed on stage 3a chronic kidney disease ([place]) 10/17/2021   Osteoarthritis of shoulder (Left) 09/15/2021   Osteoarthritis of AC  (acromioclavicular) joint (Left) 09/15/2021   Osteoarthritis of glenohumeral joint (Left) 09/15/2021   Limited internal rotation of glenohumeral joint of shoulder (Left) 09/15/2021   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 ([place]) 09/98/3382   Arthritis 08/19/2021   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   History of GI bleed 08/19/2021   MSSA bacteremia 08/19/2021   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[place]) 08/19/2021   Orthostatic hypotension 08/19/2021   Weakness 08/19/2021   HTN (hypertension) 06/27/2021   AKI (acute kidney injury) ([place]) 06/27/2021   AMS (altered mental status) 06/27/2021   CKD stage G3a/A3, GFR 45-59 and albumin creatinine ratio >300 mg/g ([place]) 06/27/2021   Peripheral vascular disease ([place]) 06/25/2021   Edema, unspecified 06/25/2021   Proteinuria, unspecified 06/25/2021   Type 2 diabetes mellitus with diabetic chronic kidney disease ([place]) 06/25/2021   Anemia in chronic kidney disease 06/25/2021   Bacteremia    Osteomyelitis ([place]) 06/01/2021   Syncope and collapse 05/25/2021   Spondylosis without myelopathy or radiculopathy, lumbosacral region 05/12/2021   CAP (community acquired pneumonia) 05/06/2021   Syncope, vasovagal 05/06/2021   CKD (chronic kidney disease), stage IIIa 05/06/2021   Diarrhea 05/06/2021   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[place]) 05/06/2021   Osteoarthritis of facet joint at L5-S1 level of lumbosacral spine 04/22/2021   Lumbar facet joint syndrome (Bilateral) (L>R) 04/22/2021   Anemia of chronic disease    Cellulitis of second toe of right foot 03/20/2021   Type 2 diabetes mellitus with  hyperlipidemia ([place]) 03/20/2021   Chronic kidney disease, stage 3a ([place]) 03/20/2021   Dizziness 03/20/2021   Chronic pain syndrome 03/03/2021   Pharmacologic therapy 03/03/2021   Disorder of skeletal system 03/03/2021   Problems influencing health status 73/71/0626   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[place]) 03/03/2021   Chronic  lower extremity pain (2ry area of Pain) (Bilateral) (R>L) 03/03/2021   Chronic anticoagulation (Plavix) 03/03/2021   Chronic shoulder pain (3ry area of Pain) (Left) 03/03/2021   History of total knee replacement (Bilateral) 03/03/2021   DDD (degenerative disc disease), thoracic 03/03/2021   Cervical facet hypertrophy (Multilevel) (Bilateral) 03/03/2021   DDD (degenerative disc disease), cervical 03/03/2021   BPH (benign prostatic hyperplasia) 02/14/2021   Diabetic foot ulcer ([place]) 02/13/2021   Hyperlipidemia associated with type 2 diabetes mellitus ([place]) 11/26/2020   Normocytic anemia 11/26/2020   Chronic kidney disease (CKD) 11/26/2020   Claudication ([place]) 11/26/2020   Contusion of right thigh 11/26/2020   COPD (chronic obstructive pulmonary disease) ([place]) 11/26/2020   Dyspnea 11/26/2020   Erectile dysfunction 11/26/2020   GERD without esophagitis 11/26/2020   Gout 11/26/2020   Hyperlipidemia, acquired 11/26/2020   Hyperlipidemia 11/26/2020   Myocardial infarction ([place]) 11/26/2020   Personal history of other diseases of urinary system 11/26/2020   Right leg swelling 11/26/2020   Sleep apnea 11/26/2020   Varicose veins 11/26/2020   Venous insufficiency 11/26/2020   Vitamin D deficiency 11/26/2020   Chest pain 11/21/2020   Thrombocytopenia ([place]) 11/21/2020   Presence of permanent cardiac pacemaker    Hypertension associated with diabetes [facility])    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 11/19/2020   Pacemaker St Jude device 11/19/2020   Peripheral vascular disease, unspecified ([place]) stents to popliteal arteries many years ago 11/19/2020   History of gingival bleeding 11/19/2020   Coronary artery disease involving coronary bypass graft of native heart with angina pectoris ([place]) 11/19/2020   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 11/19/2020   DM2 (diabetes mellitus, type 2) ([place])    Closed nondisplaced fracture of  greater trochanter of right femur ([place]) 01/25/2020   DDD (degenerative disc disease), lumbar 12/10/2019   Chronic low back pain (1ry area of Pain) (Bilateral) (L>R) w/o sciatica 12/09/2019   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   Urinary retention 12/09/2019   Obesity 04/07/2019   Retinopathy 04/07/2019   Deafness, left 01/12/2019   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/12/2019   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   Bilateral tinnitus 08/24/2018   Disequilibrium 08/24/2018   Hypertrophy of nasal turbinates 08/24/2018   Hypertrophy of tonsils 08/24/2018   Obstructive sleep apnea of adult 05/30/2018   Presence of prosthetic heart valve 05/30/2018   Heart block 04/25/2018   Calculus of gallbladder with acute cholecystitis without obstruction 09/21/2017   Right bundle branch block 12/25/2016   Gastrojejunal ulcer 03/23/2014   Jejunal ulcer 03/23/2014   Insomnia 03/22/2014   Blisters of multiple sites 01/18/2014   Cellulitis 01/18/2014   OA (osteoarthritis) of knee 06/09/2013   Status post percutaneous transluminal coronary angioplasty 01/14/2011   Coronary arteriosclerosis 01/29/2009   Bipolar disorder ([place]) 01/29/2009   Depression 01/29/2009   Hearing loss 01/29/2009    Past Surgical History:  Procedure Laterality Date   AMPUTATION TOE Right 03/20/2021   Procedure: AMPUTATION TOE;  Surgeon: Sharlotte Alamo, DPM;  Location: [facility] ORS;  Service: Podiatry;  Laterality: Right;   AMPUTATION TOE Right 06/03/2021   Procedure: AMPUTATION TOE-SECOND TOE;  Surgeon: [name], DPM;  Location: [facility] ORS;  Service: Podiatry;  Laterality: Right;   AMPUTATION TOE Right 07/26/2021   Procedure: AMPUTATION TOE-THIRD TOE;  Surgeon: [name], DPM;  Location: [facility] ORS;  Service: Podiatry;  Laterality: Right;   Ankle repaired Right    Carpal Tunnel repaired Left    CATARACT EXTRACTION Bilateral    COLONOSCOPY     Copillar implant      DG CHOLECYSTOGRAPHY GALL BLADDER ([facility] HX)     GASTRIC BYPASS      heart valve replaced     IRRIGATION AND DEBRIDEMENT FOOT Right 02/18/2021   Procedure: IRRIGATION AND DEBRIDEMENT FOOT-Right Great Toe;  Surgeon: [name], DPM;  Location: [facility] ORS;  Service: Podiatry;  Laterality: Right;   LOWER EXTREMITY ANGIOGRAPHY Right 02/17/2021   Procedure: Lower Extremity Angiography;  Surgeon: Algernon Huxley, MD;  Location: [place] [facility];  Service: Cardiovascular;  Laterality: Right;   LOWER EXTREMITY ANGIOGRAPHY Right 03/27/2021   Procedure: Lower Extremity Angiography;  Surgeon: Algernon Huxley, MD;  Location: [place] [facility];  Service: Cardiovascular;  Laterality: Right;   PACEMAKER GENERATOR CHANGE     REPLACEMENT TOTAL KNEE BILATERAL     RTC     STENT PLACE LEFT URETER ([place] HX) Right    Right Leg   TEE WITHOUT CARDIOVERSION N/A 03/26/2021   Procedure: TRANSESOPHAGEAL ECHOCARDIOGRAM (TEE);  Surgeon: Kate Sable, MD;  Location: [facility] ORS;  Service: Cardiovascular;  Laterality: N/A;   TEE WITHOUT CARDIOVERSION N/A 06/04/2021   Procedure: TRANSESOPHAGEAL ECHOCARDIOGRAM (TEE);  Surgeon: Kate Sable, MD;  Location: [facility] ORS;  Service: Cardiovascular;  Laterality: N/A;   Toe nail removed Bilateral     Prior to Admission medications   Medication Sig Start Date End Date Taking? Authorizing Provider  cephALEXin (KEFLEX) 500 MG capsule Take 1 capsule (500 mg total) by mouth 4 (four) times daily for 7 days. 10/30/21 11/06/21 Yes Arta Silence, MD  acetaminophen (TYLENOL) 325 MG tablet Take 2 tablets (650 mg total) by mouth every 6 (six) hours as needed for fever, headache or moderate pain. 05/09/21   Loletha Grayer, MD  albuterol (VENTOLIN HFA) 108 (90 Base) MCG/ACT inhaler Inhale 2 puffs into the lungs every 4 (four) hours as needed for wheezing or shortness of breath. 05/09/21   Loletha Grayer, MD  allopurinol (ZYLOPRIM) 100 MG tablet Take 100 mg by mouth daily. 10/19/20   [provider]  amLODipine (NORVASC) 10 MG tablet Take 1  tablet (10 mg total) by mouth daily. 05/28/21 10/17/21  Nolberto Hanlon, MD  BAYER ASPIRIN EC LOW DOSE 81 MG EC tablet Take 81 mg by mouth daily. 11/11/20   [provider]  CALCIUM CITRATE+D3 PETITES 200-250 MG-UNIT TABS Take by mouth. 07/10/21   [provider]  carvedilol (COREG) 6.25 MG tablet Take 6.25 mg by mouth 2 (two) times daily. 07/08/21   [provider]  Cholecalciferol (VITAMIN D3) 125 MCG (5000 UT) CAPS Take 5,000 Units by mouth daily. 05/09/21   [provider]  clopidogrel (PLAVIX) 75 MG tablet Take 1 tablet (75 mg total) by mouth daily. Patient not taking: No sig reported 02/22/21   Charlynne Cousins, MD  gabapentin (NEURONTIN) 100 MG capsule Take 100 mg by mouth daily. 01/29/21   [provider]  Insulin Glargine-yfgn 100 UNIT/ML SOPN Inject 18 Units into the skin at bedtime. 05/09/21   [provider]  isosorbide mononitrate (IMDUR) 30 MG 24 hr tablet Take 30 mg by mouth daily. 02/12/21   [provider]  lactulose, encephalopathy, (CHRONULAC) 10 GM/15ML SOLN Take 15 mLs by mouth 2 (two) times daily. 10/08/21   [provider]  loperamide (IMODIUM) 2 MG capsule Take 1 capsule (2 mg total) by mouth as needed for diarrhea or loose stools. 03/31/21   Blake Divine, MD  [facility] 17 GM/SCOOP powder Take by mouth. 10/08/21   [provider]  multivitamin (ONE-A-DAY MEN'S) TABS tablet Take 1 tablet by mouth daily. 11/11/20   [provider]  pantoprazole (PROTONIX) 40 MG tablet Take 1 tablet (40 mg total) by mouth daily. 02/11/21   [name], MD  rosuvastatin (CRESTOR) 40 MG tablet Take 40 mg by mouth daily. 02/13/21   [provider]  sertraline (ZOLOFT) 100 MG tablet Take 200 mg by mouth daily. 11/11/20   [provider]  sucralfate (CARAFATE) 1 g tablet Take 1 g by mouth 4 (four) times daily. 10/08/21   [provider]  SYMBICORT 80-4.5 MCG/ACT inhaler Inhale 2 puffs into the  lungs 2 (two) times daily. 09/08/21   [provider]  tamsulosin (FLOMAX) 0.4 MG CAPS capsule Take 0.4 mg by mouth daily. 10/07/21   [provider]  VICTOZA 18 MG/3ML SOPN Inject 1.8 mg into the skin daily. 10/07/21   [provider]    Allergies Atorvastatin, Celecoxib, Dicyclomine hcl, Doxycycline, and Metformin  Family History  Problem Relation Age of Onset   Heart disease Mother    Alzheimer's disease Mother    Heart disease Father    Diabetes Father     Social History Social History   Tobacco Use   Smoking status: Former   Smokeless tobacco: Never  Substance Use Topics   Alcohol use: Not Currently   Drug use: Not Currently    Review of Systems  Constitutional: Positive for fever. Eyes: No redness. ENT: No sore throat. Cardiovascular: Denies chest pain. Respiratory: Denies shortness of breath. Gastrointestinal: No vomiting or diarrhea.  Genitourinary: Negative for dysuria.  Positive for frequency. Musculoskeletal: Negative for back pain. Skin: Negative for rash. Neurological: Negative for headache.   ____________________________________________   PHYSICAL EXAM:  VITAL SIGNS: ED Triage Vitals [10/30/21 0944]  Enc Vitals Group     BP 138/66     Pulse Rate 98     Resp 20     Temp (!) 100.8 F (38.2 C)     Temp Source Oral     SpO2 96 %     Weight 219 lb 5.7 oz (99.5 kg)     Height 6\' 4"  (1.93 m)     Head Circumference      Peak Flow      Pain Score 10     Pain Loc      Pain Edu?      Excl. in [place]?     Constitutional: Alert and oriented. Well appearing for age and in no acute distress. Eyes: Conjunctivae are normal.  EOMI.  PERRLA. Head: Atraumatic. Nose: No congestion/rhinnorhea. Mouth/Throat: Mucous membranes are moist.   Neck: Normal range of motion.  Cardiovascular: Normal rate, regular rhythm. Grossly normal heart sounds.  Good peripheral circulation. Respiratory: Normal respiratory effort.  No retractions. Lungs  CTAB. Gastrointestinal: Soft and nontender. No distention.  Genitourinary: No flank tenderness. Musculoskeletal: No lower extremity edema.  Extremities warm and well perfused.  Neurologic:  Normal speech and language. No gross focal neurologic deficits are appreciated.  Skin:  Skin is warm and dry. No rash noted.  Several small less than 2 cm superficial skin tears to bilateral  forearms which are clean and dry with no purulent drainage.  No palpable fluctuance.  Faint possible surrounding erythema versus ecchymosis with slight increased warmth compared to the surrounding tissue. Psychiatric: Mood and affect are normal. Speech and behavior are normal.  ____________________________________________   LABS (all labs ordered are listed, but only abnormal results are displayed)  Labs Reviewed  COMPREHENSIVE METABOLIC PANEL - Abnormal; Notable for the following components:      Result Value   Sodium 134 (*)    Glucose, Bld 102 (*)    BUN 33 (*)    Creatinine, Ser 1.42 (*)    Calcium 8.4 (*)    AST 53 (*)    GFR, Estimated 50 (*)    All other components within normal limits  CBC WITH DIFFERENTIAL/PLATELET - Abnormal; Notable for the following components:   RBC 3.25 (*)    Hemoglobin 10.2 (*)    HCT 30.5 (*)    Platelets 110 (*)    Lymphs Abs 0.2 (*)    All other components within normal limits  URINALYSIS, ROUTINE W REFLEX MICROSCOPIC - Abnormal; Notable for the following components:   Color, Urine STRAW (*)    APPearance CLEAR (*)    Protein, ur 100 (*)    All other components within normal limits  RESP PANEL BY RT-PCR (FLU A&B, COVID) ARPGX2  CULTURE, BLOOD (ROUTINE X 2)  CULTURE, BLOOD (ROUTINE X 2)  LACTIC ACID, PLASMA  PROTIME-INR   ____________________________________________  EKG  ED ECG REPORT I, Arta Silence, the attending physician, personally viewed and interpreted this ECG.  Date: 10/30/2021 EKG Time: 0959 Rate: 91 Rhythm: normal sinus rhythm QRS Axis:  normal Intervals: RBBB ST/T Wave abnormalities: normal Narrative Interpretation: no evidence of acute ischemia; no significant change when compared to EKG of 10/20/2021  ____________________________________________  RADIOLOGY  Chest x-ray interpreted by me shows no focal consolidation or edema CT abdomen/pelvis: IMPRESSION:  No acute abnormality.   ____________________________________________   PROCEDURES  Procedure(s) performed: No  Procedures  Critical Care performed: No ____________________________________________   INITIAL IMPRESSION / ASSESSMENT AND PLAN / ED COURSE  Pertinent labs & imaging results that were available during my care of the patient were reviewed by me and considered in my medical decision making (see chart for details).   80 year old male with PMH as noted above including CAD, DM, PVD, CKD, and hypertension presents with multiple complaints, but primarily fever, some generalized weakness and shortness of breath, and concern for possible infection to skin tears of bilateral forearms.  The patient has a low-grade fever with otherwise normal vital signs and appears well; exam reveals clean and dry, well-healing small skin tears to bilateral forearms with some surrounding erythema/ecchymosis.  Exam is otherwise unremarkable.  Overall differential is most likely differential includes most likely infectious etiologies given the fever.  Cellulitis is possible although it appears fairly mild.  Differential also includes influenza, COVID-19, UTI, or less likely other intra-abdominal cause.  I have a low suspicion for pneumonia or other respiratory etiology.  There is no evidence of cardiac cause.  The patient has no chest pain or EKG changes.  Initial lab work-up is reassuring; I will add on a respiratory panel and CT abdomen/pelvis.  ----------------------------------------- 1:44 PM on 10/30/2021 -----------------------------------------  Work-up is entirely  unremarkable.  There is no specific source of infection identified other than possible cellulitis to the forearms.  Urinalysis is negative as is the CT abdomen/pelvis, chest x-ray, and respiratory panel.  Lactate is normal.  CBC and  CMP are consistent with the patient's baseline.  His fever has gone down.  On reassessment, the patient continues to appear well and states he feels good.  He would like to go home.  At this time, there is no specific indication for admission or further ED observation and he is stable for discharge.  We will treat for likely forearm cellulitis.  I gave the patient and his family member extensive return precautions and they expressed understanding.  ____________________________________________   FINAL CLINICAL IMPRESSION(S) / ED DIAGNOSES  Final diagnoses:  Febrile illness  Cellulitis of upper extremity, unspecified laterality      NEW MEDICATIONS STARTED DURING THIS VISIT:  New Prescriptions   CEPHALEXIN (KEFLEX) 500 MG CAPSULE    Take 1 capsule (500 mg total) by mouth 4 (four) times daily for 7 days.     Note:  This document was prepared using Dragon voice recognition software and may include unintentional dictation errors.    Arta Silence, MD 10/30/21 1351

## 2021-10-30 NOTE — ED Notes (Signed)
Pt in with co fever states believes it is from a skin tear that occurred when he "bumped" his arm 2 days ago. PT denies any falls. Pt in no distress at this time, does co left sided neck pain and mid abd pain at this time.

## 2021-10-30 NOTE — Discharge Instructions (Signed)
Take the antibiotic as prescribed and finish the full 7-day course.  Return to the ER for new, worsening, or persistent redness, bleeding, swelling, or rash to the arms, pain or swelling going up the arm, high fever, abdominal pain, vomiting, urinary symptoms, or any other new or worsening symptoms that concern you.  Follow-up with your primary care doctor within the next week.

## 2021-10-30 NOTE — ED Notes (Signed)
Patient discharged to home per MD order. Patient in stable condition, and deemed medically cleared by ED provider for discharge. Discharge instructions reviewed with patient/family using "Teach Back"; verbalized understanding of medication education and administration, and information about follow-up care. Denies further concerns. ° °

## 2021-10-31 ENCOUNTER — Other Ambulatory Visit: Payer: Self-pay

## 2021-10-31 ENCOUNTER — Encounter: Payer: Self-pay | Admitting: Emergency Medicine

## 2021-10-31 ENCOUNTER — Inpatient Hospital Stay (HOSPITAL_COMMUNITY)
Admit: 2021-10-31 | Discharge: 2021-10-31 | Disposition: A | Discharge status: Home / Self Care | Payer: 59 | Attending: Internal Medicine | Admitting: Internal Medicine

## 2021-10-31 ENCOUNTER — Emergency Department: Payer: 59

## 2021-10-31 ENCOUNTER — Inpatient Hospital Stay
Admission: EM | Admit: 2021-10-31 | Discharge: 2021-11-04 | DRG: 314 | Disposition: A | Discharge status: Other Acute Inpatient Hospital | Payer: 59 | Attending: Internal Medicine | Admitting: Internal Medicine

## 2021-10-31 ENCOUNTER — Telehealth: Payer: Self-pay | Admitting: Pharmacist

## 2021-10-31 DIAGNOSIS — I214 Non-ST elevation (NSTEMI) myocardial infarction: Secondary | ICD-10-CM | POA: Diagnosis not present

## 2021-10-31 DIAGNOSIS — R778 Other specified abnormalities of plasma proteins: Secondary | ICD-10-CM | POA: Diagnosis not present

## 2021-10-31 DIAGNOSIS — Z9884 Bariatric surgery status: Secondary | ICD-10-CM

## 2021-10-31 DIAGNOSIS — Z952 Presence of prosthetic heart valve: Secondary | ICD-10-CM

## 2021-10-31 DIAGNOSIS — J449 Chronic obstructive pulmonary disease, unspecified: Secondary | ICD-10-CM | POA: Diagnosis present

## 2021-10-31 DIAGNOSIS — B9561 Methicillin susceptible Staphylococcus aureus infection as the cause of diseases classified elsewhere: Secondary | ICD-10-CM | POA: Diagnosis not present

## 2021-10-31 DIAGNOSIS — R7401 Elevation of levels of liver transaminase levels: Secondary | ICD-10-CM | POA: Diagnosis not present

## 2021-10-31 DIAGNOSIS — Z7902 Long term (current) use of antithrombotics/antiplatelets: Secondary | ICD-10-CM

## 2021-10-31 DIAGNOSIS — I4510 Unspecified right bundle-branch block: Secondary | ICD-10-CM | POA: Diagnosis present

## 2021-10-31 DIAGNOSIS — E1122 Type 2 diabetes mellitus with diabetic chronic kidney disease: Secondary | ICD-10-CM | POA: Diagnosis not present

## 2021-10-31 DIAGNOSIS — D696 Thrombocytopenia, unspecified: Secondary | ICD-10-CM | POA: Diagnosis present

## 2021-10-31 DIAGNOSIS — I629 Nontraumatic intracranial hemorrhage, unspecified: Secondary | ICD-10-CM | POA: Diagnosis not present

## 2021-10-31 DIAGNOSIS — D631 Anemia in chronic kidney disease: Secondary | ICD-10-CM | POA: Diagnosis present

## 2021-10-31 DIAGNOSIS — R509 Fever, unspecified: Secondary | ICD-10-CM | POA: Diagnosis present

## 2021-10-31 DIAGNOSIS — Z9621 Cochlear implant status: Secondary | ICD-10-CM | POA: Diagnosis present

## 2021-10-31 DIAGNOSIS — Z9861 Coronary angioplasty status: Secondary | ICD-10-CM

## 2021-10-31 DIAGNOSIS — I25708 Atherosclerosis of coronary artery bypass graft(s), unspecified, with other forms of angina pectoris: Secondary | ICD-10-CM | POA: Diagnosis present

## 2021-10-31 DIAGNOSIS — I252 Old myocardial infarction: Secondary | ICD-10-CM

## 2021-10-31 DIAGNOSIS — Z89421 Acquired absence of other right toe(s): Secondary | ICD-10-CM

## 2021-10-31 DIAGNOSIS — I248 Other forms of acute ischemic heart disease: Secondary | ICD-10-CM | POA: Diagnosis present

## 2021-10-31 DIAGNOSIS — Z950 Presence of cardiac pacemaker: Secondary | ICD-10-CM | POA: Diagnosis not present

## 2021-10-31 DIAGNOSIS — Z888 Allergy status to other drugs, medicaments and biological substances status: Secondary | ICD-10-CM

## 2021-10-31 DIAGNOSIS — E785 Hyperlipidemia, unspecified: Secondary | ICD-10-CM | POA: Diagnosis present

## 2021-10-31 DIAGNOSIS — I129 Hypertensive chronic kidney disease with stage 1 through stage 4 chronic kidney disease, or unspecified chronic kidney disease: Secondary | ICD-10-CM | POA: Diagnosis present

## 2021-10-31 DIAGNOSIS — K521 Toxic gastroenteritis and colitis: Secondary | ICD-10-CM | POA: Diagnosis not present

## 2021-10-31 DIAGNOSIS — Z20822 Contact with and (suspected) exposure to covid-19: Secondary | ICD-10-CM | POA: Diagnosis present

## 2021-10-31 DIAGNOSIS — E1151 Type 2 diabetes mellitus with diabetic peripheral angiopathy without gangrene: Secondary | ICD-10-CM | POA: Diagnosis present

## 2021-10-31 DIAGNOSIS — G911 Obstructive hydrocephalus: Secondary | ICD-10-CM | POA: Diagnosis not present

## 2021-10-31 DIAGNOSIS — G894 Chronic pain syndrome: Secondary | ICD-10-CM | POA: Diagnosis present

## 2021-10-31 DIAGNOSIS — I959 Hypotension, unspecified: Secondary | ICD-10-CM | POA: Diagnosis present

## 2021-10-31 DIAGNOSIS — T3695XA Adverse effect of unspecified systemic antibiotic, initial encounter: Secondary | ICD-10-CM

## 2021-10-31 DIAGNOSIS — R7881 Bacteremia: Secondary | ICD-10-CM

## 2021-10-31 DIAGNOSIS — I152 Hypertension secondary to endocrine disorders: Secondary | ICD-10-CM | POA: Diagnosis present

## 2021-10-31 DIAGNOSIS — F319 Bipolar disorder, unspecified: Secondary | ICD-10-CM | POA: Diagnosis present

## 2021-10-31 DIAGNOSIS — R188 Other ascites: Secondary | ICD-10-CM | POA: Diagnosis present

## 2021-10-31 DIAGNOSIS — R338 Other retention of urine: Secondary | ICD-10-CM | POA: Diagnosis not present

## 2021-10-31 DIAGNOSIS — A419 Sepsis, unspecified organism: Principal | ICD-10-CM

## 2021-10-31 DIAGNOSIS — L03113 Cellulitis of right upper limb: Secondary | ICD-10-CM | POA: Diagnosis present

## 2021-10-31 DIAGNOSIS — I25709 Atherosclerosis of coronary artery bypass graft(s), unspecified, with unspecified angina pectoris: Secondary | ICD-10-CM | POA: Diagnosis not present

## 2021-10-31 DIAGNOSIS — L03114 Cellulitis of left upper limb: Secondary | ICD-10-CM | POA: Diagnosis present

## 2021-10-31 DIAGNOSIS — E1169 Type 2 diabetes mellitus with other specified complication: Secondary | ICD-10-CM | POA: Diagnosis present

## 2021-10-31 DIAGNOSIS — N1831 Chronic kidney disease, stage 3a: Secondary | ICD-10-CM | POA: Diagnosis present

## 2021-10-31 DIAGNOSIS — T827XXA Infection and inflammatory reaction due to other cardiac and vascular devices, implants and grafts, initial encounter: Principal | ICD-10-CM | POA: Diagnosis present

## 2021-10-31 DIAGNOSIS — I495 Sick sinus syndrome: Secondary | ICD-10-CM | POA: Diagnosis present

## 2021-10-31 DIAGNOSIS — Y713 Surgical instruments, materials and cardiovascular devices (including sutures) associated with adverse incidents: Secondary | ICD-10-CM | POA: Diagnosis present

## 2021-10-31 DIAGNOSIS — Z7951 Long term (current) use of inhaled steroids: Secondary | ICD-10-CM

## 2021-10-31 DIAGNOSIS — K7460 Unspecified cirrhosis of liver: Secondary | ICD-10-CM | POA: Diagnosis present

## 2021-10-31 DIAGNOSIS — H9190 Unspecified hearing loss, unspecified ear: Secondary | ICD-10-CM | POA: Diagnosis present

## 2021-10-31 DIAGNOSIS — E118 Type 2 diabetes mellitus with unspecified complications: Secondary | ICD-10-CM | POA: Diagnosis not present

## 2021-10-31 DIAGNOSIS — Z833 Family history of diabetes mellitus: Secondary | ICD-10-CM

## 2021-10-31 DIAGNOSIS — Z794 Long term (current) use of insulin: Secondary | ICD-10-CM

## 2021-10-31 DIAGNOSIS — D638 Anemia in other chronic diseases classified elsewhere: Secondary | ICD-10-CM | POA: Diagnosis not present

## 2021-10-31 DIAGNOSIS — I442 Atrioventricular block, complete: Secondary | ICD-10-CM | POA: Diagnosis not present

## 2021-10-31 DIAGNOSIS — S51812A Laceration without foreign body of left forearm, initial encounter: Secondary | ICD-10-CM | POA: Diagnosis present

## 2021-10-31 DIAGNOSIS — G4733 Obstructive sleep apnea (adult) (pediatric): Secondary | ICD-10-CM | POA: Diagnosis present

## 2021-10-31 DIAGNOSIS — A4101 Sepsis due to Methicillin susceptible Staphylococcus aureus: Secondary | ICD-10-CM | POA: Diagnosis present

## 2021-10-31 DIAGNOSIS — G936 Cerebral edema: Secondary | ICD-10-CM | POA: Diagnosis not present

## 2021-10-31 DIAGNOSIS — N179 Acute kidney failure, unspecified: Secondary | ICD-10-CM | POA: Diagnosis present

## 2021-10-31 DIAGNOSIS — I361 Nonrheumatic tricuspid (valve) insufficiency: Secondary | ICD-10-CM | POA: Diagnosis not present

## 2021-10-31 DIAGNOSIS — Z79899 Other long term (current) drug therapy: Secondary | ICD-10-CM

## 2021-10-31 DIAGNOSIS — R401 Stupor: Secondary | ICD-10-CM | POA: Diagnosis not present

## 2021-10-31 DIAGNOSIS — Z8249 Family history of ischemic heart disease and other diseases of the circulatory system: Secondary | ICD-10-CM

## 2021-10-31 DIAGNOSIS — Z881 Allergy status to other antibiotic agents status: Secondary | ICD-10-CM

## 2021-10-31 DIAGNOSIS — M1990 Unspecified osteoarthritis, unspecified site: Secondary | ICD-10-CM | POA: Diagnosis present

## 2021-10-31 DIAGNOSIS — M5136 Other intervertebral disc degeneration, lumbar region: Secondary | ICD-10-CM | POA: Diagnosis present

## 2021-10-31 DIAGNOSIS — E0821 Diabetes mellitus due to underlying condition with diabetic nephropathy: Secondary | ICD-10-CM | POA: Diagnosis not present

## 2021-10-31 DIAGNOSIS — Z87891 Personal history of nicotine dependence: Secondary | ICD-10-CM

## 2021-10-31 DIAGNOSIS — E876 Hypokalemia: Secondary | ICD-10-CM | POA: Diagnosis not present

## 2021-10-31 DIAGNOSIS — I25118 Atherosclerotic heart disease of native coronary artery with other forms of angina pectoris: Secondary | ICD-10-CM | POA: Diagnosis present

## 2021-10-31 DIAGNOSIS — Z953 Presence of xenogenic heart valve: Secondary | ICD-10-CM

## 2021-10-31 DIAGNOSIS — I340 Nonrheumatic mitral (valve) insufficiency: Secondary | ICD-10-CM | POA: Diagnosis not present

## 2021-10-31 DIAGNOSIS — Z96653 Presence of artificial knee joint, bilateral: Secondary | ICD-10-CM | POA: Diagnosis present

## 2021-10-31 DIAGNOSIS — Z7982 Long term (current) use of aspirin: Secondary | ICD-10-CM

## 2021-10-31 DIAGNOSIS — K219 Gastro-esophageal reflux disease without esophagitis: Secondary | ICD-10-CM | POA: Diagnosis present

## 2021-10-31 DIAGNOSIS — N401 Enlarged prostate with lower urinary tract symptoms: Secondary | ICD-10-CM | POA: Diagnosis present

## 2021-10-31 DIAGNOSIS — Z820 Family history of epilepsy and other diseases of the nervous system: Secondary | ICD-10-CM

## 2021-10-31 DIAGNOSIS — I7121 Aneurysm of the ascending aorta, without rupture: Secondary | ICD-10-CM | POA: Diagnosis present

## 2021-10-31 LAB — URINALYSIS, COMPLETE (UACMP) WITH MICROSCOPIC
Bacteria, UA: NONE SEEN
Bilirubin Urine: NEGATIVE
Glucose, UA: 50 mg/dL — AB
Ketones, ur: NEGATIVE mg/dL
Leukocytes,Ua: NEGATIVE
Nitrite: NEGATIVE
Protein, ur: 100 mg/dL — AB
Specific Gravity, Urine: 1.017 (ref 1.005–1.030)
Squamous Epithelial / HPF: NONE SEEN (ref 0–5)
pH: 5 (ref 5.0–8.0)

## 2021-10-31 LAB — COMPREHENSIVE METABOLIC PANEL
ALT: 231 U/L — ABNORMAL HIGH (ref 0–44)
AST: 352 U/L — ABNORMAL HIGH (ref 15–41)
Albumin: 2.9 g/dL — ABNORMAL LOW (ref 3.5–5.0)
Alkaline Phosphatase: 58 U/L (ref 38–126)
Anion gap: 9 (ref 5–15)
BUN: 40 mg/dL — ABNORMAL HIGH (ref 8–23)
CO2: 21 mmol/L — ABNORMAL LOW (ref 22–32)
Calcium: 7.3 mg/dL — ABNORMAL LOW (ref 8.9–10.3)
Chloride: 106 mmol/L (ref 98–111)
Creatinine, Ser: 1.88 mg/dL — ABNORMAL HIGH (ref 0.61–1.24)
GFR, Estimated: 36 mL/min — ABNORMAL LOW (ref >60)
Glucose, Bld: 120 mg/dL — ABNORMAL HIGH (ref 70–99)
Potassium: 3.5 mmol/L (ref 3.5–5.1)
Sodium: 136 mmol/L (ref 135–145)
Total Bilirubin: 0.6 mg/dL (ref 0.3–1.2)
Total Protein: 5.4 g/dL — ABNORMAL LOW (ref 6.5–8.1)

## 2021-10-31 LAB — ECHOCARDIOGRAM COMPLETE
AR max vel: 1.97 cm2
AV Area VTI: 2.72 cm2
AV Area mean vel: 1.83 cm2
AV Mean grad: 6.5 mmHg
AV Peak grad: 9.5 mmHg
Ao pk vel: 1.54 m/s
Area-P 1/2: 5.54 cm2
Height: 76 in
MV VTI: 2.95 cm2
S' Lateral: 2.8 cm
Weight: 3509.72 oz

## 2021-10-31 LAB — CBC WITH DIFFERENTIAL/PLATELET
Abs Immature Granulocytes: 0.05 10*3/uL (ref 0.00–0.07)
Basophils Absolute: 0 10*3/uL (ref 0.0–0.1)
Basophils Relative: 0 %
Eosinophils Absolute: 0 10*3/uL (ref 0.0–0.5)
Eosinophils Relative: 0 %
HCT: 28.3 % — ABNORMAL LOW (ref 39.0–52.0)
Hemoglobin: 9.7 g/dL — ABNORMAL LOW (ref 13.0–17.0)
Immature Granulocytes: 1 %
Lymphocytes Relative: 2 %
Lymphs Abs: 0.1 10*3/uL — ABNORMAL LOW (ref 0.7–4.0)
MCH: 31.7 pg (ref 26.0–34.0)
MCHC: 34.3 g/dL (ref 30.0–36.0)
MCV: 92.5 fL (ref 80.0–100.0)
Monocytes Absolute: 0.2 10*3/uL (ref 0.1–1.0)
Monocytes Relative: 2 %
Neutro Abs: 6.4 10*3/uL (ref 1.7–7.7)
Neutrophils Relative %: 95 %
Platelets: 70 10*3/uL — ABNORMAL LOW (ref 150–400)
RBC: 3.06 MIL/uL — ABNORMAL LOW (ref 4.22–5.81)
RDW: 13.4 % (ref 11.5–15.5)
WBC: 6.7 10*3/uL (ref 4.0–10.5)
nRBC: 0 % (ref 0.0–0.2)

## 2021-10-31 LAB — PROTIME-INR
INR: 1.5 — ABNORMAL HIGH (ref 0.8–1.2)
Prothrombin Time: 17.8 seconds — ABNORMAL HIGH (ref 11.4–15.2)

## 2021-10-31 LAB — BLOOD CULTURE ID PANEL (REFLEXED) - BCID2

## 2021-10-31 LAB — RESP PANEL BY RT-PCR (FLU A&B, COVID) ARPGX2
Influenza A by PCR: NEGATIVE
Influenza B by PCR: NEGATIVE
SARS Coronavirus 2 by RT PCR: NEGATIVE

## 2021-10-31 LAB — APTT: aPTT: 45 seconds — ABNORMAL HIGH (ref 24–36)

## 2021-10-31 LAB — LACTIC ACID, PLASMA
Lactic Acid, Venous: 1.3 mmol/L (ref 0.5–1.9)
Lactic Acid, Venous: 1.3 mmol/L (ref 0.5–1.9)

## 2021-10-31 LAB — TROPONIN I (HIGH SENSITIVITY)
Troponin I (High Sensitivity): 277 ng/L (ref <18)
Troponin I (High Sensitivity): 286 ng/L (ref <18)

## 2021-10-31 LAB — CBG MONITORING, ED
Glucose-Capillary: 144 mg/dL — ABNORMAL HIGH (ref 70–99)
Glucose-Capillary: 223 mg/dL — ABNORMAL HIGH (ref 70–99)

## 2021-10-31 MED ORDER — NITROGLYCERIN 0.4 MG SL SUBL
0.4000 mg | SUBLINGUAL_TABLET | Freq: Once | SUBLINGUAL | Status: AC
  Administered 2021-10-31: 0.4 mg via SUBLINGUAL
  Filled 2021-10-31: qty 1

## 2021-10-31 MED ORDER — PANTOPRAZOLE SODIUM 40 MG PO TBEC
40.0000 mg | DELAYED_RELEASE_TABLET | Freq: Every day | ORAL | Status: DC
  Administered 2021-11-01 – 2021-11-04 (×3): 40 mg via ORAL
  Filled 2021-10-31 (×3): qty 1

## 2021-10-31 MED ORDER — HYDRALAZINE HCL 50 MG PO TABS: 25.0000 mg | ORAL_TABLET | Freq: Four times a day (QID) | ORAL | Status: DC | PRN

## 2021-10-31 MED ORDER — HEPARIN SODIUM (PORCINE) 5000 UNIT/ML IJ SOLN
5000.0000 [IU] | Freq: Two times a day (BID) | INTRAMUSCULAR | Status: DC
  Administered 2021-10-31 – 2021-11-01 (×2): 5000 [IU] via SUBCUTANEOUS
  Filled 2021-10-31 (×2): qty 1

## 2021-10-31 MED ORDER — CEFAZOLIN SODIUM-DEXTROSE 2-4 GM/100ML-% IV SOLN
2.0000 g | Freq: Three times a day (TID) | INTRAVENOUS | Status: DC
  Filled 2021-10-31: qty 100

## 2021-10-31 MED ORDER — SUCRALFATE 1 G PO TABS
1.0000 g | ORAL_TABLET | Freq: Four times a day (QID) | ORAL | Status: DC
  Administered 2021-10-31 – 2021-11-04 (×14): 1 g via ORAL
  Filled 2021-10-31 (×13): qty 1

## 2021-10-31 MED ORDER — CARVEDILOL 6.25 MG PO TABS
6.2500 mg | ORAL_TABLET | Freq: Two times a day (BID) | ORAL | Status: DC
  Administered 2021-11-01 – 2021-11-04 (×8): 6.25 mg via ORAL
  Filled 2021-10-31 (×8): qty 1

## 2021-10-31 MED ORDER — LACTATED RINGERS IV BOLUS
1000.0000 mL | Freq: Once | INTRAVENOUS | Status: AC
  Administered 2021-10-31: 1000 mL via INTRAVENOUS

## 2021-10-31 MED ORDER — ASPIRIN EC 81 MG PO TBEC
81.0000 mg | DELAYED_RELEASE_TABLET | Freq: Every day | ORAL | Status: DC
  Administered 2021-11-01 – 2021-11-04 (×4): 81 mg via ORAL
  Filled 2021-10-31 (×4): qty 1

## 2021-10-31 MED ORDER — CEFAZOLIN SODIUM-DEXTROSE 1-4 GM/50ML-% IV SOLN
1.0000 g | Freq: Once | INTRAVENOUS | Status: DC
  Filled 2021-10-31: qty 50

## 2021-10-31 MED ORDER — ONDANSETRON HCL 4 MG/2ML IJ SOLN
4.0000 mg | Freq: Four times a day (QID) | INTRAMUSCULAR | Status: DC | PRN
  Administered 2021-10-31: 4 mg via INTRAVENOUS
  Filled 2021-10-31: qty 2

## 2021-10-31 MED ORDER — CEFAZOLIN SODIUM-DEXTROSE 1-4 GM/50ML-% IV SOLN
1.0000 g | Freq: Once | INTRAVENOUS | Status: AC
  Administered 2021-10-31: 1 g via INTRAVENOUS
  Filled 2021-10-31: qty 50

## 2021-10-31 MED ORDER — POLYETHYLENE GLYCOL 3350 17 G PO PACK
17.0000 g | PACK | Freq: Every day | ORAL | Status: DC
  Administered 2021-11-01 – 2021-11-02 (×2): 17 g via ORAL
  Filled 2021-10-31 (×2): qty 1

## 2021-10-31 MED ORDER — ROSUVASTATIN CALCIUM 20 MG PO TABS: 40.0000 mg | ORAL_TABLET | Freq: Every day | ORAL | Status: DC

## 2021-10-31 MED ORDER — GABAPENTIN 100 MG PO CAPS
100.0000 mg | ORAL_CAPSULE | Freq: Every day | ORAL | Status: DC
  Administered 2021-11-01 – 2021-11-04 (×3): 100 mg via ORAL
  Filled 2021-10-31 (×3): qty 1

## 2021-10-31 MED ORDER — ALBUTEROL SULFATE (2.5 MG/3ML) 0.083% IN NEBU: 2.5000 mg | INHALATION_SOLUTION | RESPIRATORY_TRACT | Status: DC | PRN

## 2021-10-31 MED ORDER — INSULIN ASPART 100 UNIT/ML IJ SOLN
0.0000 [IU] | Freq: Three times a day (TID) | INTRAMUSCULAR | Status: DC
Start: 2021-10-31 — End: 2021-11-04
  Administered 2021-10-31: 1 [IU] via SUBCUTANEOUS
  Administered 2021-11-01: 3 [IU] via SUBCUTANEOUS
  Administered 2021-11-01 – 2021-11-03 (×4): 2 [IU] via SUBCUTANEOUS
  Administered 2021-11-03: 5 [IU] via SUBCUTANEOUS
  Administered 2021-11-04: 3 [IU] via SUBCUTANEOUS
  Administered 2021-11-04 (×2): 5 [IU] via SUBCUTANEOUS
  Filled 2021-10-31 (×10): qty 1

## 2021-10-31 MED ORDER — TAMSULOSIN HCL 0.4 MG PO CAPS
0.4000 mg | ORAL_CAPSULE | Freq: Every day | ORAL | Status: DC
  Administered 2021-11-01 – 2021-11-04 (×3): 0.4 mg via ORAL
  Filled 2021-10-31 (×3): qty 1

## 2021-10-31 MED ORDER — LOPERAMIDE HCL 2 MG PO CAPS: 2.0000 mg | ORAL_CAPSULE | ORAL | Status: DC | PRN

## 2021-10-31 MED ORDER — ALLOPURINOL 100 MG PO TABS
100.0000 mg | ORAL_TABLET | Freq: Every day | ORAL | Status: DC
  Administered 2021-11-01 – 2021-11-04 (×3): 100 mg via ORAL
  Filled 2021-10-31 (×4): qty 1

## 2021-10-31 MED ORDER — CLOPIDOGREL BISULFATE 75 MG PO TABS
75.0000 mg | ORAL_TABLET | Freq: Every day | ORAL | Status: DC
  Administered 2021-10-31 – 2021-11-01 (×2): 75 mg via ORAL
  Filled 2021-10-31 (×2): qty 1

## 2021-10-31 MED ORDER — CEFAZOLIN SODIUM-DEXTROSE 2-4 GM/100ML-% IV SOLN
2.0000 g | Freq: Three times a day (TID) | INTRAVENOUS | Status: DC
  Administered 2021-10-31 – 2021-11-04 (×12): 2 g via INTRAVENOUS
  Filled 2021-10-31 (×20): qty 100

## 2021-10-31 MED ORDER — SODIUM CHLORIDE 0.9 % IV SOLN
INTRAVENOUS | Status: DC
Start: 2021-10-31 — End: 2021-11-01

## 2021-10-31 MED ORDER — FLUTICASONE FUROATE-VILANTEROL 100-25 MCG/ACT IN AEPB: 1.0000 | INHALATION_SPRAY | Freq: Every day | RESPIRATORY_TRACT | Status: DC

## 2021-10-31 MED ORDER — SERTRALINE HCL 50 MG PO TABS
200.0000 mg | ORAL_TABLET | Freq: Every day | ORAL | Status: DC
  Administered 2021-11-01 – 2021-11-04 (×3): 200 mg via ORAL
  Filled 2021-10-31 (×3): qty 4

## 2021-10-31 MED ORDER — ALBUTEROL SULFATE HFA 108 (90 BASE) MCG/ACT IN AERS: 2.0000 | INHALATION_SPRAY | RESPIRATORY_TRACT | Status: DC | PRN

## 2021-10-31 MED ORDER — LACTULOSE 10 GM/15ML PO SOLN
10.0000 g | Freq: Two times a day (BID) | ORAL | Status: DC
  Administered 2021-11-01 – 2021-11-02 (×4): 10 g via ORAL
  Filled 2021-10-31 (×5): qty 30

## 2021-10-31 MED ORDER — INSULIN GLARGINE-YFGN 100 UNIT/ML ~~LOC~~ SOPN
18.0000 [IU] | PEN_INJECTOR | Freq: Every day | SUBCUTANEOUS | Status: DC
  Filled 2021-10-31: qty 3

## 2021-10-31 MED ORDER — INSULIN GLARGINE-YFGN 100 UNIT/ML ~~LOC~~ SOLN
18.0000 [IU] | Freq: Every day | SUBCUTANEOUS | Status: DC
  Administered 2021-11-01 – 2021-11-03 (×4): 18 [IU] via SUBCUTANEOUS
  Filled 2021-10-31 (×7): qty 0.18

## 2021-10-31 MED ORDER — ACETAMINOPHEN 325 MG PO TABS
650.0000 mg | ORAL_TABLET | Freq: Four times a day (QID) | ORAL | Status: DC | PRN
  Administered 2021-11-01 – 2021-11-04 (×5): 650 mg via ORAL
  Filled 2021-10-31 (×5): qty 2

## 2021-10-31 NOTE — ED Triage Notes (Signed)
Pt to ED via [facility] from home for code sepsis. Pt was seen in the ED yesterday for fever and possible infection to a skin care on the left arm. Pt was called this morning and told that his blood cultures were positive. Upon EMS arrival, Pt was found to be hypotensive with SBP in the 80s. Pt is afebrile and does not appear to be in any distress at this time.

## 2021-10-31 NOTE — ED Notes (Signed)
ECHO at bedside.

## 2021-10-31 NOTE — Progress Notes (Signed)
*  PRELIMINARY RESULTS* Echocardiogram 2D Echocardiogram has been performed.  Nathan Singleton 10/31/2021, 2:25 PM

## 2021-10-31 NOTE — Sepsis Progress Note (Signed)
eLink is following this Code Sepsis. °

## 2021-10-31 NOTE — Consult Note (Signed)
NAME: Nathan Singleton  DOB: 1941-09-07  MRN: [id number]  Date/Time: 10/31/2021 8:35 PM  REQUESTING PROVIDER:zhang Subjective:  REASON FOR CONSULT: MSSA bacteremia ? Nathan Singleton is a 80 y.o. 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  He told his wife to check his blood pressure but the cuff was not working and hence she called EMS.  He was also having pain on the left shoulder . when EMS was called he had a temperature of 102.8 they brought him into the ED on 10/30/2021.  In the ED temperature  100.8, BP 138/66, heart rate 98, sats 96%.  WBC 5.4, Hb 10.2, platelet 110, creatinine 1.42.  He has skin tears on both his forearm.  Blood cultures were sent.  The forearm had cellulitis and he was sent home on Keflex.  He was asked to come back today because blood cultures came back positive for MSSA. Patient's wife states that 2 weeks ago patient was in the hospital between 10/17/21 and 10/19/2021 for weakness, palpitation and shortness of breath.found to be  in mild AKI.  Patient had stopped his Plavix a day before because of a procedure for his back on November 11.  Troponins were mildly elevated and it was thought to be demand ischemia.  Cardiology seen the patient and further intervention was needed.  And he was discharged home. Prior to this patient was in the hospital 06/01/2021 until 06/06/2021 for right foot infection and MSSA bacteremia.  He underwent TEE on 06/04/2021 which was negative for endocarditis but showed a calcified lesion on the pacemaker wire which was also seen in the prior TEE from April 2022.  Repeat blood culture was negative.  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  He was discharged home on IV cefazolin and completed 6 weeks of IV  antibiotics.  Prior to this in April 2022 he was in the hospital for great toe infection with MSSA bacteremia and osteomyelitis.  He received 6 weeks of IV followed by 2 weeks of p.o. Keflex.  Past Medical History:  Diagnosis Date   Aneurysm (arteriovenous) of coronary vessels    Arthritis    Ascending aortic aneurysm    CAD (coronary artery disease)    Diabetes mellitus without complication ([place])    Hard of hearing    History of GI bleed    Hyperlipidemia associated with type 2 diabetes mellitus ([place])    Hypertension associated with diabetes ([place])    Presence of permanent cardiac pacemaker    PVD (peripheral vascular disease) ([place])    S/P TAVR (transcatheter aortic valve replacement)     Past Surgical History:  Procedure Laterality Date   AMPUTATION TOE Right 03/20/2021   Procedure: AMPUTATION TOE;  Surgeon: Sharlotte Alamo, DPM;  Location: [facility];  Service: Podiatry;  Laterality: Right;   AMPUTATION TOE Right 06/03/2021   Procedure: AMPUTATION TOE-SECOND TOE;  Surgeon: [name], DPM;  Location: [facility];  Service: Podiatry;  Laterality: Right;   AMPUTATION TOE Right 07/26/2021   Procedure: AMPUTATION TOE-THIRD TOE;  Surgeon: [name], DPM;  Location: [facility];  Service: Podiatry;  Laterality: Right;   Ankle repaired Right    Carpal Tunnel repaired Left    CATARACT EXTRACTION Bilateral    COLONOSCOPY     Copillar implant  DG CHOLECYSTOGRAPHY GALL BLADDER ([place] HX)     GASTRIC BYPASS     heart valve replaced     IRRIGATION AND DEBRIDEMENT FOOT Right 02/18/2021   Procedure: IRRIGATION AND DEBRIDEMENT FOOT-Right Great Toe;  Surgeon: [name], DPM;  Location: [facility];  Service: Podiatry;  Laterality: Right;   LOWER EXTREMITY ANGIOGRAPHY Right 02/17/2021   Procedure: Lower Extremity Angiography;  Surgeon: Algernon Huxley, MD;  Location: [facility];  Service: Cardiovascular;  Laterality: Right;   LOWER EXTREMITY ANGIOGRAPHY Right 03/27/2021   Procedure: Lower  Extremity Angiography;  Surgeon: Algernon Huxley, MD;  Location: [facility];  Service: Cardiovascular;  Laterality: Right;   PACEMAKER GENERATOR CHANGE     REPLACEMENT TOTAL KNEE BILATERAL     RTC     STENT PLACE LEFT URETER ([place] HX) Right    Right Leg   TEE WITHOUT CARDIOVERSION N/A 03/26/2021   Procedure: TRANSESOPHAGEAL ECHOCARDIOGRAM (TEE);  Surgeon: Kate Sable, MD;  Location: [facility];  Service: Cardiovascular;  Laterality: N/A;   TEE WITHOUT CARDIOVERSION N/A 06/04/2021   Procedure: TRANSESOPHAGEAL ECHOCARDIOGRAM (TEE);  Surgeon: Kate Sable, MD;  Location: [facility];  Service: Cardiovascular;  Laterality: N/A;   Toe nail removed Bilateral     Social History   Socioeconomic History   Marital status: Single    Spouse name: Not on file   Number of children: Not on file   Years of education: Not on file   Highest education level: Not on file  Occupational History   Not on file  Tobacco Use   Smoking status: Former   Smokeless tobacco: Never  Substance and Sexual Activity   Alcohol use: Not Currently   Drug use: Not Currently   Sexual activity: Not on file  Other Topics Concern   Not on file  Social History Narrative   Not on file   Social Determinants of Health   Financial Resource Strain: Not on file  Food Insecurity: Not on file  Transportation Needs: Not on file  Physical Activity: Not on file  Stress: Not on file  Social Connections: Not on file  Intimate Partner Violence: Not on file    Family History  Problem Relation Age of Onset   Heart disease Mother    Alzheimer's disease Mother    Heart disease Father    Diabetes Father    Allergies  Allergen Reactions   Atorvastatin Other (See Comments)    "bad for kidneys" Cramping    Celecoxib Other (See Comments)    Other reaction(s): Other (See Comments) Kidney Problem Kidney Problem    Dicyclomine Hcl Other (See Comments)    Stomach cramps   Doxycycline Other (See Comments)     Per wife (per RN)- pt gets severe stomach pains even when takes with food and PCP said to not take     Metformin Other (See Comments)   I? Current Facility-Administered Medications  Medication Dose Route Frequency Provider Last Rate Last Admin   0.9 %  sodium chloride infusion   Intravenous Continuous Sharion Settler, NP 100 mL/hr at 10/31/21 1336 New Bag at 10/31/21 1336   acetaminophen (TYLENOL) tablet 650 mg  650 mg Oral Q6H PRN Wynetta Fines T, MD       albuterol (PROVENTIL) (2.5 MG/3ML) 0.083% nebulizer solution 2.5 mg  2.5 mg Nebulization Q4H PRN Wynetta Fines T, MD       allopurinol (ZYLOPRIM) tablet 100 mg  100 mg Oral Daily Lequita Halt, MD  aspirin EC tablet 81 mg  81 mg Oral Daily Wynetta Fines T, MD       carvedilol (COREG) tablet 6.25 mg  6.25 mg Oral BID WC Wynetta Fines T, MD       ceFAZolin (ANCEF) IVPB 2g/100 mL premix  2 g Intravenous Q8H Rauer, Forde Dandy, RPH   Stopped at 10/31/21 1950   clopidogrel (PLAVIX) tablet 75 mg  75 mg Oral Daily Wynetta Fines T, MD   75 mg at 10/31/21 1743   fluticasone furoate-vilanterol (BREO ELLIPTA) 100-25 MCG/ACT 1 puff  1 puff Inhalation Daily Wynetta Fines T, MD       gabapentin (NEURONTIN) capsule 100 mg  100 mg Oral Daily Wynetta Fines T, MD       heparin injection 5,000 Units  5,000 Units Subcutaneous Q12H Wynetta Fines T, MD       insulin aspart (novoLOG) injection 0-9 Units  0-9 Units Subcutaneous TID WC Lequita Halt, MD   1 Units at 10/31/21 1742   insulin glargine-yfgn (SEMGLEE) injection 18 Units  18 Units Subcutaneous QHS Lequita Halt, MD       [START ON 11/01/2021] lactulose (CHRONULAC) 10 GM/15ML solution 10 g  10 g Oral BID Wynetta Fines T, MD       loperamide (IMODIUM) capsule 2 mg  2 mg Oral PRN Lequita Halt, MD       ondansetron [facility]) injection 4 mg  4 mg Intravenous Q6H PRN Wynetta Fines T, MD   4 mg at 10/31/21 1313   pantoprazole (PROTONIX) EC tablet 40 mg  40 mg Oral Daily Lequita Halt, MD       [START ON 11/01/2021]  polyethylene glycol (MIRALAX / GLYCOLAX) packet 17 g  17 g Oral Daily Wynetta Fines T, MD       sertraline (ZOLOFT) tablet 200 mg  200 mg Oral Daily Wynetta Fines T, MD       sucralfate (CARAFATE) tablet 1 g  1 g Oral QID Wynetta Fines T, MD       tamsulosin [facility]) capsule 0.4 mg  0.4 mg Oral Daily Lequita Halt, MD       Current Outpatient Medications  Medication Sig Dispense Refill   acetaminophen (TYLENOL) 325 MG tablet Take 2 tablets (650 mg total) by mouth every 6 (six) hours as needed for fever, headache or moderate pain.     albuterol (VENTOLIN HFA) 108 (90 Base) MCG/ACT inhaler Inhale 2 puffs into the lungs every 4 (four) hours as needed for wheezing or shortness of breath. 18 g 0   allopurinol (ZYLOPRIM) 100 MG tablet Take 100 mg by mouth daily.     amLODipine (NORVASC) 10 MG tablet Take 1 tablet (10 mg total) by mouth daily. 30 tablet 0   aspirin 81 MG EC tablet Take 81 mg by mouth daily.     budesonide-formoterol (SYMBICORT) 80-4.5 MCG/ACT inhaler Inhale 2 puffs into the lungs 2 (two) times daily.     CALCIUM CITRATE+D3 PETITES 200-250 MG-UNIT TABS Take 1 tablet by mouth as directed.     carvedilol (COREG) 6.25 MG tablet Take 6.25 mg by mouth 2 (two) times daily.     Cholecalciferol (VITAMIN D3) 125 MCG (5000 UT) CAPS Take 5,000 Units by mouth daily.     clopidogrel (PLAVIX) 75 MG tablet Take 1 tablet (75 mg total) by mouth daily. 60 tablet 3   gabapentin (NEURONTIN) 100 MG capsule Take 100 mg by mouth daily.     Insulin Glargine-yfgn 100  UNIT/ML SOPN Inject 22 Units into the skin at bedtime.     isosorbide mononitrate (IMDUR) 30 MG 24 hr tablet Take 30 mg by mouth daily.     lactulose, encephalopathy, (CHRONULAC) 10 GM/15ML SOLN Take 15 mLs by mouth 2 (two) times daily.     linaclotide (LINZESS) 145 MCG CAPS capsule Take 145 mcg by mouth as directed.     liraglutide (VICTOZA) 18 MG/3ML SOPN Inject 1.8 mg into the skin daily.     loperamide (IMODIUM) 2 MG capsule Take 1 capsule (2 mg  total) by mouth as needed for diarrhea or loose stools. 30 capsule 0   multivitamin (ONE-A-DAY MEN'S) TABS tablet Take 1 tablet by mouth daily.     pantoprazole (PROTONIX) 40 MG tablet Take 1 tablet (40 mg total) by mouth daily. 90 tablet 1   rosuvastatin (CRESTOR) 40 MG tablet Take 40 mg by mouth daily.     sertraline (ZOLOFT) 100 MG tablet Take 200 mg by mouth daily.     sucralfate (CARAFATE) 1 g tablet Take 1 g by mouth 4 (four) times daily.     tamsulosin (FLOMAX) 0.4 MG CAPS capsule Take 0.4 mg by mouth daily.     cephALEXin (KEFLEX) 500 MG capsule Take 1 capsule (500 mg total) by mouth 4 (four) times daily for 7 days. 28 capsule 0     Abtx:  Anti-infectives (From admission, onward)    Start     Dose/Rate Route Frequency Ordered Stop   10/31/21 2200  ceFAZolin (ANCEF) IVPB 2g/100 mL premix  Status:  Discontinued        2 g 200 mL/hr over 30 Minutes Intravenous Every 8 hours 10/31/21 1300 10/31/21 1301   10/31/21 1800  ceFAZolin (ANCEF) IVPB 2g/100 mL premix        2 g 200 mL/hr over 30 Minutes Intravenous Every 8 hours 10/31/21 1301     10/31/21 1315  ceFAZolin (ANCEF) IVPB 1 g/50 mL premix  Status:  Discontinued        1 g 100 mL/hr over 30 Minutes Intravenous  Once 10/31/21 1300 10/31/21 1301   10/31/21 1015  ceFAZolin (ANCEF) IVPB 1 g/50 mL premix        1 g 100 mL/hr over 30 Minutes Intravenous  Once 10/31/21 1001 10/31/21 1116       REVIEW OF SYSTEMS:  Const:  fever, negative chills, negative weight loss Eyes: negative diplopia or visual changes, negative eye pain ENT: negative coryza, negative sore throat Resp:  dyspnea Cards: chest pain, palpitations, lower extremity edema GU: Difficulty in passing urine.  Some incontinence. GI: Some abdominal pain, no diarrhea, bleeding, constipation Skin: negative for rash and pruritus Heme: negative for easy bruising and gum/nose bleeding MS: Has back pain and joint pain. Neurolo: Hard of hearing.  Negative for headaches,  dizziness, vertigo, memory problems  Psych: negative for feelings of anxiety, depression  Endocrine: , diabetes Allergy/Immunology-as above  Objective:  VITALS:  BP (!) 88/67   Pulse 81   Temp 98.8 F (37.1 C) (Oral)   Resp (!) 29   Ht 6\' 4"  (1.93 m)   Wt 99.5 kg   SpO2 94%   BMI 26.70 kg/m  PHYSICAL EXAM:  General: Alert, cooperative, no distress, appears stated age.  Hard of hearing Head: Normocephalic, without obvious abnormality, atraumatic. Eyes: Conjunctivae clear, anicteric sclerae. Pupils are equal ENT Nares normal. No drainage or sinus tenderness. Lips, mucosa, and tongue normal. No Thrush Neck: Supple, symmetrical, no adenopathy, thyroid: non tender  no carotid bruit and no JVD. Back: No CVA tenderness. Lungs: Bilateral air entry. Crackles bases . Heart: Regular rate and rhythm, no murmur, rub or gallop. Abdomen: Soft, non-tender,not distended. Bowel sounds normal. No masses Extremities: Right foot amputation of few toes.  Scar healthy  bruising and tear over the forearm skin Skin: Bruising over hips and legs Lymph: Cervical, supraclavicular normal. Neurologic: Grossly non-focal Pertinent Labs Lab Results CBC    Component Value Date/Time   WBC 6.7 10/31/2021 1013   RBC 3.06 (L) 10/31/2021 1013   HGB 9.7 (L) 10/31/2021 1013   HGB 9.2 (L) 09/15/2021 1509   HCT 28.3 (L) 10/31/2021 1013   HCT 28.4 (L) 09/15/2021 1509   PLT 70 (L) 10/31/2021 1013   PLT 111 (L) 09/15/2021 1509   MCV 92.5 10/31/2021 1013   MCV 94 09/15/2021 1509   MCH 31.7 10/31/2021 1013   MCHC 34.3 10/31/2021 1013   RDW 13.4 10/31/2021 1013   RDW 14.8 09/15/2021 1509   LYMPHSABS 0.1 (L) 10/31/2021 1013   LYMPHSABS 0.9 09/15/2021 1509   MONOABS 0.2 10/31/2021 1013   EOSABS 0.0 10/31/2021 1013   EOSABS 0.2 09/15/2021 1509   BASOSABS 0.0 10/31/2021 1013   BASOSABS 0.0 09/15/2021 1509    CMP Latest Ref Rng & Units 10/31/2021 10/30/2021 10/19/2021  Glucose 70 - 99 mg/dL 120(H) 102(H)  184(H)  BUN 8 - 23 mg/dL 40(H) 33(H) 39(H)  Creatinine 0.61 - 1.24 mg/dL 1.88(H) 1.42(H) 1.43(H)  Sodium 135 - 145 mmol/L 136 134(L) 138  Potassium 3.5 - 5.1 mmol/L 3.5 4.4 4.3  Chloride 98 - 111 mmol/L 106 104 107  CO2 22 - 32 mmol/L 21(L) 24 26  Calcium 8.9 - 10.3 mg/dL 7.3(L) 8.4(L) 8.7(L)  Total Protein 6.5 - 8.1 g/dL 5.4(L) 7.1 -  Total Bilirubin 0.3 - 1.2 mg/dL 0.6 0.7 -  Alkaline Phos 38 - 126 U/L 58 87 -  AST 15 - 41 U/L 352(H) 53(H) -  ALT 0 - 44 U/L 231(H) 38 -      Microbiology: Recent Results (from the past 240 hour(s))  Culture, blood (Routine x 2)     Status: None (Preliminary result)   Collection Time: 10/30/21  9:51 AM   Specimen: BLOOD  Result Value Ref Range Status   Specimen Description BLOOD LEFT ANTECUBITAL  Final   Special Requests   Final    BOTTLES DRAWN AEROBIC AND ANAEROBIC Blood Culture adequate volume   Culture  Setup Time   Final    GRAM POSITIVE COCCI IN BOTH AEROBIC AND ANAEROBIC BOTTLES CRITICAL VALUE NOTED.  VALUE IS CONSISTENT WITH PREVIOUSLY REPORTED AND CALLED VALUE. Performed at [facility], [address] [place], [place] [address]    Culture [facility] POSITIVE COCCI  Final   Report Status PENDING  Incomplete  Culture, blood (Routine x 2)     Status: None (Preliminary result)   Collection Time: 10/30/21 10:02 AM   Specimen: BLOOD  Result Value Ref Range Status   Specimen Description BLOOD LEFT WRIST  Final   Special Requests   Final    BOTTLES DRAWN AEROBIC AND ANAEROBIC Blood Culture results may not be optimal due to an excessive volume of blood received in culture bottles   Culture  Setup Time   Final    Organism ID to follow GRAM POSITIVE COCCI IN BOTH AEROBIC AND ANAEROBIC BOTTLES CRITICAL RESULT CALLED TO, READ BACK BY AND VERIFIED WITH: Leonides Schanz RN [id number] 10/30/21 HNM Performed at [facility], [address] [place], [place] [address]    Culture GRAM POSITIVE COCCI  Final   Report Status PENDING   Incomplete  Blood Culture ID Panel (Reflexed)     Status: Abnormal   Collection Time: 10/30/21 10:02 AM  Result Value Ref Range Status   Enterococcus faecalis NOT DETECTED NOT DETECTED Final   Enterococcus Faecium NOT DETECTED NOT DETECTED Final   Listeria monocytogenes NOT DETECTED NOT DETECTED Final   Staphylococcus species DETECTED (A) NOT DETECTED Final    Comment: CRITICAL RESULT CALLED TO, READ BACK BY AND VERIFIED WITH: Leonides Schanz RN [id number] 10/30/21 HNM    Staphylococcus aureus (BCID) DETECTED (A) NOT DETECTED Final    Comment: CRITICAL RESULT CALLED TO, READ BACK BY AND VERIFIED WITH: Bangs 2239 10/30/21 HNM    Staphylococcus epidermidis NOT DETECTED NOT DETECTED Final   Staphylococcus lugdunensis NOT DETECTED NOT DETECTED Final   Streptococcus species NOT DETECTED NOT DETECTED Final   Streptococcus agalactiae NOT DETECTED NOT DETECTED Final   Streptococcus pneumoniae NOT DETECTED NOT DETECTED Final   Streptococcus pyogenes NOT DETECTED NOT DETECTED Final   A.calcoaceticus-baumannii NOT DETECTED NOT DETECTED Final   Bacteroides fragilis NOT DETECTED NOT DETECTED Final   Enterobacterales NOT DETECTED NOT DETECTED Final   Enterobacter cloacae complex NOT DETECTED NOT DETECTED Final   Escherichia coli NOT DETECTED NOT DETECTED Final   Klebsiella aerogenes NOT DETECTED NOT DETECTED Final   Klebsiella oxytoca NOT DETECTED NOT DETECTED Final   Klebsiella pneumoniae NOT DETECTED NOT DETECTED Final   Proteus species NOT DETECTED NOT DETECTED Final   Salmonella species NOT DETECTED NOT DETECTED Final   Serratia marcescens NOT DETECTED NOT DETECTED Final   Haemophilus influenzae NOT DETECTED NOT DETECTED Final   Neisseria meningitidis NOT DETECTED NOT DETECTED Final   Pseudomonas aeruginosa NOT DETECTED NOT DETECTED Final   Stenotrophomonas maltophilia NOT DETECTED NOT DETECTED Final   Candida albicans NOT DETECTED NOT DETECTED Final   Candida auris NOT DETECTED NOT  DETECTED Final   Candida glabrata NOT DETECTED NOT DETECTED Final   Candida krusei NOT DETECTED NOT DETECTED Final   Candida parapsilosis NOT DETECTED NOT DETECTED Final   Candida tropicalis NOT DETECTED NOT DETECTED Final   Cryptococcus neoformans/gattii NOT DETECTED NOT DETECTED Final   Meth resistant mecA/C and MREJ NOT DETECTED NOT DETECTED Final    Comment: Performed at [facility], [address] [place], [place] [address]  Resp Panel by RT-PCR (Flu A&B, Covid) Nasopharyngeal Swab     Status: None   Collection Time: 10/30/21 12:14 PM   Specimen: Nasopharyngeal Swab; Nasopharyngeal(NP) swabs in vial transport medium  Result Value Ref Range Status   SARS Coronavirus 2 by RT PCR NEGATIVE NEGATIVE Final    Comment: (NOTE) SARS-CoV-2 target nucleic acids are NOT DETECTED.  The SARS-CoV-2 RNA is generally detectable in upper respiratory specimens during the acute phase of infection. The lowest concentration of SARS-CoV-2 viral copies this assay can detect is 138 copies/mL. A negative result does not preclude SARS-Cov-2 infection and should not be used as the sole basis for treatment or other patient management decisions. A negative result may occur with  improper specimen collection/handling, submission of specimen other than nasopharyngeal swab, presence of viral mutation(s) within the areas targeted by this assay, and inadequate number of viral copies(<138 copies/mL). A negative result must be combined with clinical observations, patient history, and epidemiological information. The expected result is Negative.  Fact Sheet for Patients:  EntrepreneurPulse.com.au  Fact Sheet for Healthcare  Providers:  IncredibleEmployment.be  This test is no t yet approved or cleared by the [place] and  has been authorized for detection and/or diagnosis of SARS-CoV-2 by FDA under an Emergency Use Authorization (EUA). This EUA will remain   in effect (meaning this test can be used) for the duration of the COVID-19 declaration under Section 564(b)(1) of the Act, 21 U.S.C.section 360bbb-3(b)(1), unless the authorization is terminated  or revoked sooner.       Influenza A by PCR NEGATIVE NEGATIVE Final   Influenza B by PCR NEGATIVE NEGATIVE Final    Comment: (NOTE) The Xpert Xpress SARS-CoV-2/FLU/RSV plus assay is intended as an aid in the diagnosis of influenza from Nasopharyngeal swab specimens and should not be used as a sole basis for treatment. Nasal washings and aspirates are unacceptable for Xpert Xpress SARS-CoV-2/FLU/RSV testing.  Fact Sheet for Patients: EntrepreneurPulse.com.au  Fact Sheet for Healthcare Providers: IncredibleEmployment.be  This test is not yet approved or cleared by the [place] FDA and has been authorized for detection and/or diagnosis of SARS-CoV-2 by FDA under an Emergency Use Authorization (EUA). This EUA will remain in effect (meaning this test can be used) for the duration of the COVID-19 declaration under Section 564(b)(1) of the Act, 21 U.S.C. section 360bbb-3(b)(1), unless the authorization is terminated or revoked.  Performed at [facility], [address] [place], [place] [address]   Resp Panel by RT-PCR (Flu A&B, Covid) Nasopharyngeal Swab     Status: None   Collection Time: 10/31/21 10:11 AM   Specimen: Nasopharyngeal Swab; Nasopharyngeal(NP) swabs in vial transport medium  Result Value Ref Range Status   SARS Coronavirus 2 by RT PCR NEGATIVE NEGATIVE Final    Comment: (NOTE) SARS-CoV-2 target nucleic acids are NOT DETECTED.  The SARS-CoV-2 RNA is generally detectable in upper respiratory specimens during the acute phase of infection. The lowest concentration of SARS-CoV-2 viral copies this assay can detect is 138 copies/mL. A negative result does not preclude SARS-Cov-2 infection and should not be used as the sole  basis for treatment or other patient management decisions. A negative result may occur with  improper specimen collection/handling, submission of specimen other than nasopharyngeal swab, presence of viral mutation(s) within the areas targeted by this assay, and inadequate number of viral copies(<138 copies/mL). A negative result must be combined with clinical observations, patient history, and epidemiological information. The expected result is Negative.  Fact Sheet for Patients:  EntrepreneurPulse.com.au  Fact Sheet for Healthcare Providers:  IncredibleEmployment.be  This test is no t yet approved or cleared by the [place] FDA and  has been authorized for detection and/or diagnosis of SARS-CoV-2 by FDA under an Emergency Use Authorization (EUA). This EUA will remain  in effect (meaning this test can be used) for the duration of the COVID-19 declaration under Section 564(b)(1) of the Act, 21 U.S.C.section 360bbb-3(b)(1), unless the authorization is terminated  or revoked sooner.       Influenza A by PCR NEGATIVE NEGATIVE Final   Influenza B by PCR NEGATIVE NEGATIVE Final    Comment: (NOTE) The Xpert Xpress SARS-CoV-2/FLU/RSV plus assay is intended as an aid in the diagnosis of influenza from Nasopharyngeal swab specimens and should not be used as a sole basis for treatment. Nasal washings and aspirates are unacceptable for Xpert Xpress SARS-CoV-2/FLU/RSV testing.  Fact Sheet for Patients: EntrepreneurPulse.com.au  Fact Sheet for Healthcare Providers: IncredibleEmployment.be  This test is not yet approved or cleared by the [place] and has been authorized  for detection and/or diagnosis of SARS-CoV-2 by FDA under an Emergency Use Authorization (EUA). This EUA will remain in effect (meaning this test can be used) for the duration of the COVID-19 declaration under Section 564(b)(1) of the Act,  21 U.S.C. section 360bbb-3(b)(1), unless the authorization is terminated or revoked.  Performed at [facility], [address] [place], [place] [address]   Blood Culture (routine x 2)     Status: None (Preliminary result)   Collection Time: 10/31/21 10:13 AM   Specimen: BLOOD  Result Value Ref Range Status   Specimen Description BLOOD RIGHT WRIST  Final   Special Requests   Final    BOTTLES DRAWN AEROBIC AND ANAEROBIC Blood Culture adequate volume   Culture  Setup Time   Final    Organism ID to follow GRAM POSITIVE COCCI IN BOTH AEROBIC AND ANAEROBIC BOTTLES CRITICAL RESULT CALLED TO, READ BACK BY AND VERIFIED WITH: SHEEMA HALLAGI AT 1900 ON 10/31/21 BY SS Performed at [facility], [address] [place], [place] [address]    Culture GRAM POSITIVE COCCI  Final   Report Status PENDING  Incomplete  Blood Culture (routine x 2)     Status: None (Preliminary result)   Collection Time: 10/31/21 10:13 AM   Specimen: BLOOD  Result Value Ref Range Status   Specimen Description BLOOD LEFT WRIST  Final   Special Requests BOTTLES DRAWN AEROBIC AND ANAEROBIC BCAV  Final   Culture  Setup Time   Final    GRAM POSITIVE COCCI IN BOTH AEROBIC AND ANAEROBIC BOTTLES CRITICAL VALUE NOTED.  VALUE IS CONSISTENT WITH PREVIOUSLY REPORTED AND CALLED VALUE. Performed at [facility], [address] [place], [place] [address]    Culture [facility] POSITIVE COCCI  Final   Report Status PENDING  Incomplete  Blood Culture ID Panel (Reflexed)     Status: Abnormal   Collection Time: 10/31/21 10:13 AM  Result Value Ref Range Status   Enterococcus faecalis NOT DETECTED NOT DETECTED Final   Enterococcus Faecium NOT DETECTED NOT DETECTED Final   Listeria monocytogenes NOT DETECTED NOT DETECTED Final   Staphylococcus species DETECTED (A) NOT DETECTED Final    Comment: CRITICAL RESULT CALLED TO, READ BACK BY AND VERIFIED WITH: SHEEMA HALLAGI AT 1900 ON 10/31/21 BY SS     Staphylococcus aureus (BCID) DETECTED (A) NOT DETECTED Final    Comment: CRITICAL RESULT CALLED TO, READ BACK BY AND VERIFIED WITH: SHEEMA HALLAGI AT 1900 ON 10/31/21 BY SS    Staphylococcus epidermidis NOT DETECTED NOT DETECTED Final   Staphylococcus lugdunensis NOT DETECTED NOT DETECTED Final   Streptococcus species NOT DETECTED NOT DETECTED Final   Streptococcus agalactiae NOT DETECTED NOT DETECTED Final   Streptococcus pneumoniae NOT DETECTED NOT DETECTED Final   Streptococcus pyogenes NOT DETECTED NOT DETECTED Final   A.calcoaceticus-baumannii NOT DETECTED NOT DETECTED Final   Bacteroides fragilis NOT DETECTED NOT DETECTED Final   Enterobacterales NOT DETECTED NOT DETECTED Final   Enterobacter cloacae complex NOT DETECTED NOT DETECTED Final   Escherichia coli NOT DETECTED NOT DETECTED Final   Klebsiella aerogenes NOT DETECTED NOT DETECTED Final   Klebsiella oxytoca NOT DETECTED NOT DETECTED Final   Klebsiella pneumoniae NOT DETECTED NOT DETECTED Final   Proteus species NOT DETECTED NOT DETECTED Final   Salmonella species NOT DETECTED NOT DETECTED Final   Serratia marcescens NOT DETECTED NOT DETECTED Final   Haemophilus influenzae NOT DETECTED NOT DETECTED Final   Neisseria meningitidis NOT DETECTED NOT DETECTED Final   Pseudomonas  aeruginosa NOT DETECTED NOT DETECTED Final   Stenotrophomonas maltophilia NOT DETECTED NOT DETECTED Final   Candida albicans NOT DETECTED NOT DETECTED Final   Candida auris NOT DETECTED NOT DETECTED Final   Candida glabrata NOT DETECTED NOT DETECTED Final   Candida krusei NOT DETECTED NOT DETECTED Final   Candida parapsilosis NOT DETECTED NOT DETECTED Final   Candida tropicalis NOT DETECTED NOT DETECTED Final   Cryptococcus neoformans/gattii NOT DETECTED NOT DETECTED Final   Meth resistant mecA/C and MREJ NOT DETECTED NOT DETECTED Final    Comment: Performed at [facility], [address] [place], [place] [address]    IMAGING RESULTS: I  have personally reviewed the films ? 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  The previous 2 times it was associated with infection on the right foot.  He was treated both times with 6 weeks of IV antibiotics followed by oral antibiotics.  This time there is no clear source.  Very likely the pacemaker wire is infected.  It needs to be removed. Need TEE and removal of the pacemaker . Continue cefazolin. Seen by cardiologist.  Planning TEE early next week.  CKD  Thrombocytopenia  Anemia  Diabetes mellitus management as per primary team  Hypertension management as per primary team.  Peripheral vascular disease.  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  Call if needed. Note:  This document was prepared using Dragon voice recognition software and may include unintentional dictation errors.

## 2021-10-31 NOTE — ED Notes (Addendum)
Pts BP 70/55 after 1 SL nitro.

## 2021-10-31 NOTE — ED Notes (Signed)
Date and time results received: 10/31/21 1314  (use smartphrase ".now" to insert current time)  Test: trop Critical Value: 277  Name of Provider Notified: Dr. Roosevelt Locks  Orders Received? Or Actions Taken?: see chart.

## 2021-10-31 NOTE — ED Provider Notes (Signed)
[facility] Emergency Department Provider Note   ____________________________________________   Event Date/Time   First MD Initiated Contact with Patient 10/31/21 [phone]     (approximate)  I have reviewed the triage vital signs and the nursing notes.   HISTORY  Chief Complaint Hypotension and abnormal labs    HPI Nathan Singleton is a 80 y.o. male with past medical history of hypertension, diabetes, CAD, CKD, and peripheral vascular disease who presents to the ED for abnormal labs.  Patient was seen in the ED yesterday due to fever and 1 week of worsening arm pain following fall with skin tears to bilateral forearms.  Work-up was reassuring at that time and patient was sent home on Keflex to cover for cellulitis.  Since then, patient's blood cultures have come back positive with MSSA and he was called to return to the ED for reevaluation.  When EMS arrived to patient's home, he was noted to be hypotensive with blood pressures in the 90s over 50s.  He was given approximately 250 cc of fluids and brought to the ED for further evaluation.  Patient reports feeling generally weak and ill, but denies any specific areas of pain.  He complains of mild shortness of breath, denies any cough or chest pain.  He has not sure whether he has had any additional fever since yesterday.  He denies any rashes or skin changes outside of the skin tears to bilateral forearms.        Past Medical History:  Diagnosis Date   Aneurysm (arteriovenous) of coronary vessels    Arthritis    Ascending aortic aneurysm    CAD (coronary artery disease)    Diabetes mellitus without complication (HCC)    Hard of hearing    History of GI bleed    Hyperlipidemia associated with type 2 diabetes mellitus ([place])    Hypertension associated with diabetes ([place])    Presence of permanent cardiac pacemaker    PVD (peripheral vascular disease) ([place])    S/P TAVR (transcatheter aortic valve replacement)      Patient Active Problem List   Diagnosis Date Noted   Elevated troponin    NSTEMI (non-ST elevated myocardial infarction) ([place]) 10/17/2021   Pancytopenia ([place]) 10/17/2021   CAD (coronary artery disease) 10/17/2021   Acute renal failure superimposed on stage 3a chronic kidney disease ([place]) 10/17/2021   Osteoarthritis of shoulder (Left) 09/15/2021   Osteoarthritis of AC (acromioclavicular) joint (Left) 09/15/2021   Osteoarthritis of glenohumeral joint (Left) 09/15/2021   Limited internal rotation of glenohumeral joint of shoulder (Left) 09/15/2021   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 ([place]) 02/40/9735   Arthritis 08/19/2021   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   History of GI bleed 08/19/2021   MSSA bacteremia 08/19/2021   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[place]) 08/19/2021   Orthostatic hypotension 08/19/2021   Weakness 08/19/2021   HTN (hypertension) 06/27/2021   AKI (acute kidney injury) ([place]) 06/27/2021   AMS (altered mental status) 06/27/2021   CKD stage G3a/A3, GFR 45-59 and albumin creatinine ratio >300 mg/g ([place]) 06/27/2021   Peripheral vascular disease ([place]) 06/25/2021   Edema, unspecified 06/25/2021   Proteinuria, unspecified 06/25/2021   Type 2 diabetes mellitus with diabetic chronic kidney disease ([place]) 06/25/2021   Anemia in chronic kidney disease 06/25/2021   Bacteremia    Osteomyelitis ([place]) 06/01/2021   Syncope and collapse 05/25/2021   Spondylosis without myelopathy or radiculopathy, lumbosacral region 05/12/2021   CAP (community acquired pneumonia) 05/06/2021  Syncope, vasovagal 05/06/2021   CKD (chronic kidney disease), stage IIIa 05/06/2021   Diarrhea 05/06/2021   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[place]) 05/06/2021   Osteoarthritis of facet joint at L5-S1 level of lumbosacral spine 04/22/2021   Lumbar facet joint syndrome (Bilateral) (L>R) 04/22/2021   Anemia of chronic disease    Cellulitis of second toe of right  foot 03/20/2021   Type 2 diabetes mellitus with hyperlipidemia ([place]) 03/20/2021   Chronic kidney disease, stage 3a ([place]) 03/20/2021   Dizziness 03/20/2021   Chronic pain syndrome 03/03/2021   Pharmacologic therapy 03/03/2021   Disorder of skeletal system 03/03/2021   Problems influencing health status 68/11/7516   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[place]) 03/03/2021   Chronic lower extremity pain (2ry area of Pain) (Bilateral) (R>L) 03/03/2021   Chronic anticoagulation (Plav[place]) 03/03/2021   Chronic shoulder pain (3ry area of Pain) (Left) 03/03/2021   History of total knee replacement (Bilateral) 03/03/2021   DDD (degenerative disc disease), thoracic 03/03/2021   Cervical facet hypertrophy ([place]) (Bilateral) 03/03/2021   DDD (degenerative disc disease), cervical 03/03/2021   BPH (benign prostatic hyperplasia) 02/14/2021   Diabetic foot ulcer ([place]) 02/13/2021   Hyperlipidemia associated with type 2 diabetes mellitus ([place]) 11/26/2020   Normocytic anemia 11/26/2020   Chronic kidney disease (CKD) 11/26/2020   Claudication ([place]) 11/26/2020   Contusion of right thigh 11/26/2020   COPD (chronic obstructive pulmonary disease) ([place]) 11/26/2020   Dyspnea 11/26/2020   Erectile dysfunction 11/26/2020   GERD without esophagitis 11/26/2020   Gout 11/26/2020   Hyperlipidemia, acquired 11/26/2020   Hyperlipidemia 11/26/2020   Myocardial infarction ([place]) 11/26/2020   Personal history of other diseases of urinary system 11/26/2020   Right leg swelling 11/26/2020   Sleep apnea 11/26/2020   Varicose veins 11/26/2020   Venous insufficiency 11/26/2020   Vitamin D deficiency 11/26/2020   Chest pain 11/21/2020   Thrombocytopenia ([place]) 11/21/2020   Presence of permanent cardiac pacemaker    Hypertension associated with diabetes 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 11/19/2020   Pacemaker St Jude device 11/19/2020   Peripheral vascular disease,  unspecified ([place]) stents to popliteal arteries many years ago 11/19/2020   History of gingival bleeding 11/19/2020   Coronary artery disease involving coronary bypass graft of native heart with angina pectoris ([place]) 11/19/2020   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 11/19/2020   DM2 (diabetes mellitus, type 2) ([place])    Closed nondisplaced fracture of greater trochanter of right femur ([place]) 01/25/2020   DDD (degenerative disc disease), lumbar 12/10/2019   Chronic low back pain (1ry area of Pain) (Bilateral) (L>R) w/o sciatica 12/09/2019   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   Urinary retention 12/09/2019   Obesity 04/07/2019   Retinopathy 04/07/2019   Deafness, left 01/12/2019   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/12/2019   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   Bilateral tinnitus 08/24/2018   Disequilibrium 08/24/2018   Hypertrophy of nasal turbinates 08/24/2018   Hypertrophy of tonsils 08/24/2018   Obstructive sleep apnea of adult 05/30/2018   Presence of prosthetic heart valve 05/30/2018   Heart block 04/25/2018   Calculus of gallbladder with acute cholecystitis without obstruction 09/21/2017   Right bundle branch block 12/25/2016   Gastrojejunal ulcer 03/23/2014   Jejunal ulcer 03/23/2014   Insomnia 03/22/2014   Blisters of multiple sites 01/18/2014   Cellulitis 01/18/2014   OA (osteoarthritis) of knee 06/09/2013   Status post percutaneous transluminal coronary angioplasty 01/14/2011   Coronary arteriosclerosis  01/29/2009   Bipolar disorder ([name]) 01/29/2009   Depression 01/29/2009   Hearing loss 01/29/2009    Past Surgical History:  Procedure Laterality Date   AMPUTATION TOE Right 03/20/2021   Procedure: AMPUTATION TOE;  Surgeon: Sharlotte Alamo, DPM;  Location: [facility] ORS;  Service: Podiatry;  Laterality: Right;   AMPUTATION TOE Right 06/03/2021   Procedure: AMPUTATION TOE-SECOND TOE;  Surgeon: [name], DPM;  Location: [facility] ORS;  Service: Podiatry;   Laterality: Right;   AMPUTATION TOE Right 07/26/2021   Procedure: AMPUTATION TOE-THIRD TOE;  Surgeon: [name], DPM;  Location: [facility] ORS;  Service: Podiatry;  Laterality: Right;   Ankle repaired Right    Carpal Tunnel repaired Left    CATARACT EXTRACTION Bilateral    COLONOSCOPY     Copillar implant      DG CHOLECYSTOGRAPHY GALL BLADDER ([facility] HX)     GASTRIC BYPASS     heart valve replaced     IRRIGATION AND DEBRIDEMENT FOOT Right 02/18/2021   Procedure: IRRIGATION AND DEBRIDEMENT FOOT-Right Great Toe;  Surgeon: [name], DPM;  Location: [facility] ORS;  Service: Podiatry;  Laterality: Right;   LOWER EXTREMITY ANGIOGRAPHY Right 02/17/2021   Procedure: Lower Extremity Angiography;  Surgeon: Algernon Huxley, MD;  Location: [facility];  Service: Cardiovascular;  Laterality: Right;   LOWER EXTREMITY ANGIOGRAPHY Right 03/27/2021   Procedure: Lower Extremity Angiography;  Surgeon: Algernon Huxley, MD;  Location: [facility];  Service: Cardiovascular;  Laterality: Right;   PACEMAKER GENERATOR CHANGE     REPLACEMENT TOTAL KNEE BILATERAL     RTC     STENT PLACE LEFT URETER ([place]X) Right    Right Leg   TEE WITHOUT CARDIOVERSION N/A 03/26/2021   Procedure: TRANSESOPHAGEAL ECHOCARDIOGRAM (TEE);  Surgeon: Kate Sable, MD;  Location: [facility];  Service: Cardiovascular;  Laterality: N/A;   TEE WITHOUT CARDIOVERSION N/A 06/04/2021   Procedure: TRANSESOPHAGEAL ECHOCARDIOGRAM (TEE);  Surgeon: Kate Sable, MD;  Location: [facility] OR[facility];  Service: Cardiovascular;  Laterality: N/A;   Toe nail removed Bilateral     Prior to Admission medications   Medication Sig Start Date End Date Taking? Authorizing Provider  acetaminophen (TYLENOL) 325 MG tablet Take 2 tablets (650 mg total) by mouth every 6 (six) hours as needed for fever, headache or moderate pain. 05/09/21   Loletha Grayer, MD  albuterol (VENTOLIN HFA) 108 (90 Base) MCG/ACT inhaler Inhale 2 puffs into the lungs every 4 (four)  hours as needed for wheezing or shortness of breath. 05/09/21   Loletha Grayer, MD  allopurinol (ZYLOPRIM) 100 MG tablet Take 100 mg by mouth daily. 10/19/20   [provider]  amLODipine (NORVASC) 10 MG tablet Take 1 tablet (10 mg total) by mouth daily. 05/28/21 10/17/21  Nolberto Hanlon, MD  BAYER ASPIRIN EC LOW DOSE 81 MG EC tablet Take 81 mg by mouth daily. 11/11/20   [provider]  CALCIUM CITRATE+D3 PETITES 200-250 MG-UNIT TABS Take by mouth. 07/10/21   [provider]  carvedilol (COREG) 6.25 MG tablet Take 6.25 mg by mouth 2 (two) times daily. 07/08/21   [provider]  cephALEXin (KEFLEX) 500 MG capsule Take 1 capsule (500 mg total) by mouth 4 (four) times daily for 7 days. 10/30/21 11/06/21  Arta Silence, MD  Cholecalciferol (VITAMIN D3) 125 MCG (5000 UT) CAPS Take 5,000 Units by mouth daily. 05/09/21   [provider]  clopidogrel (PLAVIX) 75 MG tablet Take 1 tablet (75 mg total) by mouth daily. Patient not taking:  No sig reported 02/22/21   Charlynne Cousins, MD  gabapentin (NEURONTIN) 100 MG capsule Take 100 mg by mouth daily. 01/29/21   [provider]  Insulin Glargine-yfgn 100 UNIT/ML SOPN Inject 18 Units into the skin at bedtime. 05/09/21   [provider]  isosorbide mononitrate (IMDUR) 30 MG 24 hr tablet Take 30 mg by mouth daily. 02/12/21   [provider]  lactulose, encephalopathy, (CHRONULAC) 10 GM/15ML SOLN Take 15 mLs by mouth 2 (two) times daily. 10/08/21   [provider]  loperamide (IMODIUM) 2 MG capsule Take 1 capsule (2 mg total) by mouth as needed for diarrhea or loose stools. 03/31/21   Blake Divine, MD  [facility] 17 GM/SCOOP powder Take by mouth. 10/08/21   [provider]  multivitamin (ONE-A-DAY MEN'S) TABS tablet Take 1 tablet by mouth daily. 11/11/20   [provider]  pantoprazole (PROTONIX) 40 MG tablet Take 1 tablet (40 mg total) by mouth daily. 02/11/21   [name], MD  rosuvastatin (CRESTOR) 40 MG tablet Take 40 mg by mouth daily. 02/13/21   [provider]  sertraline (ZOLOFT) 100 MG tablet Take 200 mg by mouth daily. 11/11/20   [provider]  sucralfate (CARAFATE) 1 g tablet Take 1 g by mouth 4 (four) times daily. 10/08/21   [provider]  SYMBICORT 80-4.5 MCG/ACT inhaler Inhale 2 puffs into the lungs 2 (two) times daily. 09/08/21   [provider]  tamsulosin (FLOMAX) 0.4 MG CAPS capsule Take 0.4 mg by mouth daily. 10/07/21   [provider]  VICTOZA 18 MG/3ML SOPN Inject 1.8 mg into the skin daily. 10/07/21   [provider]    Allergies Atorvastatin, Celecoxib, Dicyclomine hcl, Doxycycline, and Metformin  Family History  Problem Relation Age of Onset   Heart disease Mother    Alzheimer's disease Mother    Heart disease Father    Diabetes Father     Social History Social History   Tobacco Use   Smoking status: Former   Smokeless tobacco: Never  Substance Use Topics   Alcohol use: Not Currently   Drug use: Not Currently    Review of Systems  Constitutional: Positive for fever/chills.  Positive for generalized weakness and malaise. Eyes: No visual changes. ENT: No sore throat. Cardiovascular: Denies chest pain. Respiratory: Denies shortness of breath. Gastrointestinal: No abdominal pain.  No nausea, no vomiting.  No diarrhea.  No constipation. Genitourinary: Negative for dysuria. Musculoskeletal: Negative for back pain. Skin: Negative for rash.  Positive for skin tears. Neurological: Negative for headaches, focal weakness or numbness.  ____________________________________________   PHYSICAL EXAM:  VITAL SIGNS: ED Triage Vitals  Enc Vitals Group     BP --      Pulse --      Resp --      Temp --      Temp src --      SpO2 10/31/21 0953 98 %     Weight 10/31/21 0958 219 lb 5.7 oz (99.5 kg)     Height 10/31/21 0958 6\' 4"  (1.93 m)     Head Circumference --       Peak Flow --      Pain Score 10/31/21 0957 0     Pain Loc --      Pain Edu? --      Excl. in [place]? --     Constitutional: Alert and oriented, very hard of hearing. Eyes: Conjunctivae are normal. Head: Atraumatic. Nose: No congestion/rhinnorhea. Mouth/Throat:  Mucous membranes are moist. Neck: Normal ROM Cardiovascular: Normal rate, regular rhythm. Grossly normal heart sounds.  2+ radial pulses bilaterally. Respiratory: Normal respiratory effort.  No retractions. Lungs CTAB. Gastrointestinal: Soft and nontender. No distention. Genitourinary: deferred Musculoskeletal: No lower extremity tenderness nor edema. Neurologic:  Normal speech and language. No gross focal neurologic deficits are appreciated. Skin:  Skin is warm, dry and intact. No rash noted.  Skin tears to bilateral forearms with mild erythema but no edema or purulent drainage. Psychiatric: Mood and affect are normal. Speech and behavior are normal.  ____________________________________________   LABS (all labs ordered are listed, but only abnormal results are displayed)  Labs Reviewed  COMPREHENSIVE METABOLIC PANEL - Abnormal; Notable for the following components:      Result Value   CO2 21 (*)    Glucose, Bld 120 (*)    BUN 40 (*)    Creatinine, Ser 1.88 (*)    Calcium 7.3 (*)    Total Protein 5.4 (*)    Albumin 2.9 (*)    AST 352 (*)    ALT 231 (*)    GFR, Estimated 36 (*)    All other components within normal limits  CBC WITH DIFFERENTIAL/PLATELET - Abnormal; Notable for the following components:   RBC 3.06 (*)    Hemoglobin 9.7 (*)    HCT 28.3 (*)    Platelets 70 (*)    Lymphs Abs 0.1 (*)    All other components within normal limits  PROTIME-INR - Abnormal; Notable for the following components:   Prothrombin Time 17.8 (*)    INR 1.5 (*)    All other components within normal limits  APTT - Abnormal; Notable for the following components:   aPTT 45 (*)    All other components within normal limits  RESP  PANEL BY RT-PCR (FLU A&B, COVID) [id number]  CULTURE, BLOOD (ROUTINE X 2)  CULTURE, BLOOD (ROUTINE X 2)  URINE CULTURE  LACTIC ACID, PLASMA  LACTIC ACID, PLASMA  URINALYSIS, COMPLETE (UACMP) WITH MICROSCOPIC  TROPONIN I (HIGH SENSITIVITY)  TROPONIN I (HIGH SENSITIVITY)   ____________________________________________  EKG  ED ECG REPORT I, Blake Divine, the attending physician, personally viewed and interpreted this ECG.   Date: 10/31/2021  EKG Time: 9:55  Rate: 86  Rhythm: normal sinus rhythm  Axis: Normal  Intervals:right bundle branch block  ST&T Change: None    PROCEDURES  Procedure(s) performed (including Critical Care):  .Critical Care Performed by: Blake Divine, MD Authorized by: Blake Divine, MD   Critical care provider statement:    Critical care time (minutes):  45   Critical care time was exclusive of:  Separately billable procedures and treating other patients and teaching time   Critical care was necessary to treat or prevent imminent or life-threatening deterioration of the following conditions:  Sepsis   Critical care was time spent personally by me on the following activities:  Development of treatment plan with patient or surrogate, discussions with consultants, evaluation of patient's response to treatment, examination of patient, ordering and review of laboratory studies, ordering and review of radiographic studies, ordering and performing treatments and interventions, pulse oximetry, re-evaluation of patient's condition and review of old charts   I assumed direction of critical care for this patient from another provider in my specialty: no     Care discussed with: admitting provider     ____________________________________________   INITIAL IMPRESSION / ASSESSMENT AND PLAN / ED COURSE      80 year old male with past medical history  of hypertension, diabetes, CAD, CKD, and peripheral vascular disease who presents to the ED due to concern for  positive blood cultures obtained in the ED yesterday.  Patient is awake and alert on arrival, but noted to be tachypneic and hypotensive, concerning for worsening sepsis and septic shock.  Blood cultures had returned positive for MSSA and we will start patient on Ancef per pharmacy recommendation, also start 30 cc/kg IV fluid bolus.  He has no abdominal tenderness and abdominal CT was obtained yesterday, negative for acute process.  We will repeat chest x-ray but source of infection appears to be cellulitis related to skin tears to both forearms.  Patient's blood pressure is gradually improving following 30 cc/kg of IV fluid bolus.  Labs remarkable for mild AKI as well as transaminitis, likely due to hypoperfusion from sepsis.  Patient remains awake and alert, case discussed with hospitalist for admission.      ____________________________________________   FINAL CLINICAL IMPRESSION(S) / ED DIAGNOSES  Final diagnoses:  Sepsis, due to unspecified organism, unspecified whether acute organ dysfunction present ([place])  AKI (acute kidney injury) ([place])  Bacteremia     ED Discharge Orders     None        Note:  This document was prepared using Dragon voice recognition software and may include unintentional dictation errors.    Blake Divine, MD 10/31/21 1220

## 2021-10-31 NOTE — Progress Notes (Signed)
CODE SEPSIS - PHARMACY COMMUNICATION  **Broad Spectrum Antibiotics should be administered within 1 hour of Sepsis diagnosis**  Time Code Sepsis Called/Page Received: 1000  Antibiotics Ordered: cefazolin  Time of 1st antibiotic administration: [place] ,PharmD Clinical Pharmacist  10/31/2021  10:02 AM

## 2021-10-31 NOTE — ED Notes (Signed)
Date and time results received: 10/31/21 3:54 PM  (use smartphrase ".now" to insert current time)  Test: trop  Critical Value: 286  Name of Provider Notified: Dr. Roosevelt Locks  Orders Received? Or Actions Taken?: see chart

## 2021-10-31 NOTE — Consult Note (Signed)
Cardiology Consultation:   Patient ID: Nathan Singleton; [id number]; 1941-02-04   Admit date: 10/31/2021 Date of Consult: 10/31/2021  Primary Care Provider: Ranae Plumber, [place] Primary Cardiologist: Agustin Cree - formerly [facility]  Primary Electrophysiologist:  None   Patient Profile:   Nathan Singleton is a 80 y.o. 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 Roosevelt Locks.  History of Present Illness:   Mr. Recore was previously followed by [facility] in  [place], though has more recently established with Dr. Joycelyn Rua upon relocating to [facility]. Most recent cardiac cath on 12/12/2019 showed stable, mild nonobstructive disease and patent previously placed proximal RCA stent with mild in-stent restenosis.  Nuclear stress test on 06/15/2020 showed no perfusion defect with an EF of 63%.   He has been seen in the ER several times due to mechanical falls.   He was admitted in 02/2021 with a diabetic foot ulcer with bone scan concerning for osteomyelitis of the great toe.  He underwent I&D and biopsy which showed no evidence of osteomyelitis.  He was treated with antibiotics.  He was evaluated by vascular surgery and underwent angiogram and angioplasty of the SFA and percutaneous transluminal angioplasty of the right dorsalis podalic artery and distal anterior tibial artery. He was started on aspirin and Plavix.  Note indicates his pacer was interrogated with  no events noted.  He was readmitted in 03/2021 with cellulitis of the right great toe ultimately requiring amputation due to gangrenous changes.  Hospital admission was complicated by MSSA bacteremia. 2D surface echo on 4/11 showed an EF greater than 55%, indeterminate LV diastolic function parameters, normal RV systolic function and ventricular cavity size, mildly dilated left atrium, mild mitral regurgitation, mild aortic valve stenosis with a mean gradient of 16 mmHg, and an estimated right atrial pressure of 15 mmHg.  There was no definite evidence of endocarditis.  He subsequently underwent TEE that admission which showed a preserved LV systolic function with no evidence of vegetation or infective endocarditis.  He was admitted in 04/2021 with cellulitis involving the second to with negative blood cultures along with syncope felt to be related to orthostatic hypotension.  He was admitted in 05/2021 with continued orthostatic hypotension and right second toe wound.  He was admitted a second time in 05/2021 with worsening chronic right second toe wound with blood cultures again showing MSSA bacteremia.  He underwent repeat TEE which showed no vegetations.  However, there was a small 5 x 3 mm calcified/fibrinous structure in the right atrium attached to the pacemaker lead that was noted on echo in 03/2021 and felt to likely represent prior calcified vegetation versus thrombus.  He underwent amputation of the right second toe.  He was admitted in 07/2021 with osteomyelitis involving the third toe on the right foot and underwent successful amputation.  He was admitted in early 10/2021 with generalized malaise, chest pain, dyspnea, and mildly elevated troponin peaking at 104.  Repeat blood cultures were no growth x2  at that time.  Most recently, he was seen in the ED yesterday with fever and bilateral arm pain.  Blood culture was obtained and subsequently grew staph aureus.  He returned to the ED today due to continued  generalized malaise, fatigue with a T-max of 103 F, associated chills, myalgias and arthralgias.  There has been some gait unsteadiness leading to a skin tear on the left upper extremity.  COVID and influenza negative.  In the ED T-max 100.8.  High-sensitivity troponin has peaked at 277 currently.  Repeat blood cultures pending x2.  Echo showed an EF of 60 to 65%, no regional wall motion abnormalities, mild LVH, grade 1 diastolic dysfunction, normal RV systolic function and ventricular cavity size, mildly dilated left atrium, and normal structure and function of aortic valve prosthesis.  Cardiology consulted for elevated troponin.  EKG showed sinus rhythm, 86 bpm, RBBB.  At times in the ED, he has become hypotensive, with this he has noted some chest discomfort.  With regards to his recurrent MSSA bacteremia he has been started on Ancef.  Currently, he is without angina.   Past Medical History:  Diagnosis Date   Aneurysm (arteriovenous) of coronary vessels    Arthritis    Ascending aortic aneurysm    CAD (coronary artery disease)    Diabetes mellitus without complication ([place])    Hard of hearing    History of GI bleed    Hyperlipidemia associated with type 2 diabetes mellitus ([place])    Hypertension associated with diabetes ([place])    Presence of permanent cardiac pacemaker    PVD (peripheral vascular disease) ([place])    S/P TAVR (transcatheter aortic valve replacement)     Past Surgical History:  Procedure Laterality Date   AMPUTATION TOE Right 03/20/2021   Procedure: AMPUTATION TOE;  Surgeon: Sharlotte Alamo, DPM;  Location: [facility] ORS;  Service: Podiatry;  Laterality: Right;   AMPUTATION TOE Right 06/03/2021   Procedure: AMPUTATION TOE-SECOND TOE;  Surgeon: [name], DPM;  Location: [facility] ORS;  Service: Podiatry;  Laterality: Right;   AMPUTATION TOE Right 07/26/2021   Procedure: AMPUTATION TOE-THIRD TOE;  Surgeon: [name], DPM;  Location: [facility] ORS;  Service: Podiatry;  Laterality: Right;    Ankle repaired Right    Carpal Tunnel repaired Left    CATARACT EXTRACTION Bilateral    COLONOSCOPY     Copillar implant      DG CHOLECYSTOGRAPHY GALL BLADDER ([facility] HX)     GASTRIC BYPASS     heart valve replaced     IRRIGATION AND DEBRIDEMENT FOOT Right 02/18/2021   Procedure: IRRIGATION AND DEBRIDEMENT FOOT-Right Great Toe;  Surgeon: [name], DPM;  Location: [facility] OR[facility];  Service: Podiatry;  Laterality: Right;   LOWER EXTREMITY ANGIOGRAPHY Right 02/17/2021   Procedure: Lower Extremity Angiography;  Surgeon: Algernon Huxley, MD;  Location: [place] [facility];  Service: Cardiovascular;  Laterality: Right;   LOWER EXTREMITY ANGIOGRAPHY Right 03/27/2021   Procedure: Lower Extremity Angiography;  Surgeon: Algernon Huxley, MD;  Location: [place] [facility];  Service: Cardiovascular;  Laterality: Right;   PACEMAKER GENERATOR CHANGE     REPLACEMENT TOTAL KNEE BILATERAL     RTC     STENT PLACE LEFT URETER ([place] HX) Right    Right Leg   TEE WITHOUT CARDIOVERSION N/A 03/26/2021   Procedure: TRANSESOPHAGEAL ECHOCARDIOGRAM (TEE);  Surgeon: Kate Sable, MD;  Location: [facility];  Service: Cardiovascular;  Laterality: N/A;   TEE WITHOUT CARDIOVERSION N/A 06/04/2021   Procedure: TRANSESOPHAGEAL ECHOCARDIOGRAM (  TEE);  Surgeon: Kate Sable, MD;  Location: [facility];  Service: Cardiovascular;  Laterality: N/A;   Toe nail removed Bilateral      Home Meds: Prior to Admission medications   Medication Sig Start Date End Date Taking? Authorizing Provider  acetaminophen (TYLENOL) 325 MG tablet Take 2 tablets (650 mg total) by mouth every 6 (six) hours as needed for fever, headache or moderate pain. 05/09/21   Loletha Grayer, MD  albuterol (VENTOLIN HFA) 108 (90 Base) MCG/ACT inhaler Inhale 2 puffs into the lungs every 4 (four) hours as needed for wheezing or shortness of breath. 05/09/21   Loletha Grayer, MD  allopurinol (ZYLOPRIM) 100 MG tablet Take 100 mg by mouth daily. 10/19/20   [provider]  amLODipine (NORVASC) 10 MG tablet Take 1 tablet (10 mg total) by mouth daily. 05/28/21 10/17/21  Nolberto Hanlon, MD  BAYER ASPIRIN EC LOW DOSE 81 MG EC tablet Take 81 mg by mouth daily. 11/11/20   [provider]  CALCIUM CITRATE+D3 PETITES 200-250 MG-UNIT TABS Take by mouth. 07/10/21   [provider]  carvedilol (COREG) 6.25 MG tablet Take 6.25 mg by mouth 2 (two) times daily. 07/08/21   [provider]  cephALEXin (KEFLEX) 500 MG capsule Take 1 capsule (500 mg total) by mouth 4 (four) times daily for 7 days. 10/30/21 11/06/21  Arta Silence, MD  Cholecalciferol (VITAMIN D3) 125 MCG (5000 UT) CAPS Take 5,000 Units by mouth daily. 05/09/21   [provider]  clopidogrel (PLAVIX) 75 MG tablet Take 1 tablet (75 mg total) by mouth daily. Patient not taking: No sig reported 02/22/21   Charlynne Cousins, MD  gabapentin (NEURONTIN) 100 MG capsule Take 100 mg by mouth daily. 01/29/21   [provider]  Insulin Glargine-yfgn 100 UNIT/ML SOPN Inject 18 Units into the skin at bedtime. 05/09/21   [provider]  isosorbide mononitrate (IMDUR) 30 MG 24 hr tablet Take 30 mg by mouth daily. 02/12/21   [provider]  lactulose, encephalopathy, (CHRONULAC) 10 GM/15ML SOLN Take 15 mLs by mouth 2 (two) times daily. 10/08/21   [provider]  loperamide (IMODIUM) 2 MG capsule Take 1 capsule (2 mg total) by mouth as needed for diarrhea or loose stools. 03/31/21   Blake Divine, MD  [facility] 17 GM/SCOOP powder Take by mouth. 10/08/21   [provider]  multivitamin (ONE-A-DAY MEN'S) TABS tablet Take 1 tablet by mouth daily. 11/11/20   [provider]  pantoprazole (PROTONIX) 40 MG tablet Take 1 tablet (40 mg total) by mouth daily. 02/11/21   [name], MD  rosuvastatin (CRESTOR) 40 MG tablet Take 40 mg by mouth daily. 02/13/21   [provider]  sertraline (ZOLOFT) 100 MG tablet Take 200 mg by mouth daily.  11/11/20   [provider]  sucralfate (CARAFATE) 1 g tablet Take 1 g by mouth 4 (four) times daily. 10/08/21   [provider]  SYMBICORT 80-4.5 MCG/ACT inhaler Inhale 2 puffs into the lungs 2 (two) times daily. 09/08/21   [provider]  tamsulosin (FLOMAX) 0.4 MG CAPS capsule Take 0.4 mg by mouth daily. 10/07/21   [provider]  VICTOZA 18 MG/3ML SOPN Inject 1.8 mg into the skin daily. 10/07/21   [provider]    Inpatient Medications: Scheduled Meds:  allopurinol  100 mg Oral Daily   aspirin  81 mg Oral Daily   carvedilol  6.25 mg Oral BID WC   clopidogrel  75 mg Oral Daily  fluticasone furoate-vilanterol  1 puff Inhalation Daily   gabapentin  100 mg Oral Daily   heparin  5,000 Units Subcutaneous Q12H   insulin aspart  0-9 Units Subcutaneous TID WC   insulin glargine-yfgn  18 Units Subcutaneous QHS   [START ON 11/01/2021] lactulose (encephalopathy)  10 g Oral BID   pantoprazole  40 mg Oral Daily   [START ON 11/01/2021] polyethylene glycol powder  1 Container Oral Daily   sertraline  200 mg Oral Daily   sucralfate  1 g Oral QID   tamsulosin  0.4 mg Oral Daily   Continuous Infusions:  sodium chloride 100 mL/hr at 10/31/21 1336    ceFAZolin (ANCEF) IV     PRN Meds: acetaminophen, albuterol, loperamide, ondansetron (ZOFRAN) IV  Allergies:   Allergies  Allergen Reactions   Atorvastatin Other (See Comments)    "bad for kidneys" Cramping    Celecoxib Other (See Comments)    Other reaction(s): Other (See Comments) Kidney Problem Kidney Problem    Dicyclomine Hcl Other (See Comments)    Stomach cramps   Doxycycline Other (See Comments)    Per wife (per RN)- pt gets severe stomach pains even when takes with food and PCP said to not take     Metformin Other (See Comments)    Social History:   Social History   Socioeconomic History   Marital status: Single    Spouse name: Not on file   Number of children: Not on file    Years of education: Not on file   Highest education level: Not on file  Occupational History   Not on file  Tobacco Use   Smoking status: Former   Smokeless tobacco: Never  Substance and Sexual Activity   Alcohol use: Not Currently   Drug use: Not Currently   Sexual activity: Not on file  Other Topics Concern   Not on file  Social History Narrative   Not on file   Social Determinants of Health   Financial Resource Strain: Not on file  Food Insecurity: Not on file  Transportation Needs: Not on file  Physical Activity: Not on file  Stress: Not on file  Social Connections: Not on file  Intimate Partner Violence: Not on file     Family History:   Family History  Problem Relation Age of Onset   Heart disease Mother    Alzheimer's disease Mother    Heart disease Father    Diabetes Father     ROS:  Review of Systems  Constitutional:  Positive for chills, fever and malaise/fatigue. Negative for diaphoresis and weight loss.  HENT:  Negative for congestion.   Eyes:  Negative for discharge and redness.  Respiratory:  Negative for cough, sputum production, shortness of breath and wheezing.   Cardiovascular:  Negative for chest pain, palpitations, orthopnea, claudication, leg swelling and PND.  Gastrointestinal:  Negative for abdominal pain, heartburn, nausea and vomiting.  Musculoskeletal:  Positive for joint pain and myalgias. Negative for falls.  Skin:  Negative for rash.  Neurological:  Positive for weakness. Negative for dizziness, tingling, tremors, sensory change, speech change, focal weakness and loss of consciousness.  Endo/Heme/Allergies:  Does not bruise/bleed easily.  Psychiatric/Behavioral:  Negative for substance abuse. The patient is not nervous/anxious.   All other systems reviewed and are negative.    Physical Exam/Data:   Vitals:   10/31/21 1300 10/31/21 1317 10/31/21 1400 10/31/21 1409  BP: (!) 157/90  (!) 70/55 93/67  Pulse: (!) 108  (!) 106 60  Resp: (!) 31  (!) 28 (!) 25  Temp:  98.8 F (37.1 C)    TempSrc:  Oral    SpO2: (!) 88%  (!) 88% 94%  Weight:      Height:        Intake/Output Summary (Last 24 hours) at 10/31/2021 1457 Last data filed at 10/31/2021 1221 Gross per 24 hour  Intake 1050 ml  Output --  Net 1050 ml   Filed Weights   10/31/21 0958  Weight: 99.5 kg   Body mass index is 26.7 kg/m.   Physical Exam: General: Well developed, well nourished, in no acute distress. Head: Normocephalic, atraumatic, sclera non-icteric, no xanthomas, nares without discharge.  Neck: Negative for carotid bruits. JVD not elevated. Lungs: Clear bilaterally to auscultation without wheezes, rales, or rhonchi. Breathing is unlabored. Heart: RRR with S1 S2. II/VI systolic murmur, no rubs, or gallops appreciated. Abdomen: Soft, non-tender, non-distended with normoactive bowel sounds. No hepatomegaly. No rebound/guarding. No obvious abdominal masses. Msk:  Strength and tone appear normal for age. Extremities: No clubbing or cyanosis. No edema. Distal pedal pulses are 2+ and equal bilaterally. Neuro: Alert and oriented X 3. No facial asymmetry. No focal deficit. Moves all extremities spontaneously. Psych:  Responds to questions appropriately with a normal affect.   EKG:  The EKG was personally reviewed and demonstrates: sinus rhythm, 86 bpm, RBBB Telemetry:  Telemetry was personally reviewed and demonstrates: Sinus tachycardia, low 100s bpm  Weights: Filed Weights   10/31/21 0958  Weight: 99.5 kg    Relevant CV Studies:  2D echo 10/31/2021: 1. Left ventricular ejection fraction, by estimation, is 60 to 65%. The  left ventricle has normal function. The left ventricle has no regional  wall motion abnormalities. There is mild left ventricular hypertrophy.  Left ventricular diastolic parameters  are consistent with Grade I diastolic dysfunction (impaired relaxation).   2. Right ventricular systolic function is normal. The  right ventricular  size is normal. Tricuspid regurgitation signal is inadequate for assessing  PA pressure.   3. Left atrial size was mildly dilated.   4. The mitral valve is normal in structure. No evidence of mitral valve  regurgitation. No evidence of mitral stenosis.   5. The aortic valve was not well visualized. Aortic valve regurgitation  is not visualized. No aortic stenosis is present. Echo findings are  consistent with normal structure and function of the aortic valve  prosthesis. Aortic valve mean gradient measures   6.5 mmHg. __________  TEE 05/2021: 1. Left ventricular ejection fraction, by estimation, is 60%. The left  ventricle has normal function.   2. Right ventricular systolic function is normal. The right ventricular  size is normal.   3. Left atrial size was mildly dilated. No left atrial/left atrial  appendage thrombus was detected.   4. The mitral valve is normal in structure. Mild mitral valve  regurgitation.   5. The aortic valve has been repaired/replaced. Aortic valve  regurgitation is not visualized. Echo findings are consistent with normal  structure and function of the aortic valve prosthesis.   Conclusion(s)/Recommendation(s): There is a small 5 x 3 mm  calcified/fibrinous structure in the right atrium (image clip 31),  attached to the pacemaker lead. This was present on prior TEecho 2 months  ago. likely represents prior calcified vegetation vs  thrombus. no acute vegetation or endocarditis noted on this TEE. __________   TEE 03/2021: 1. Left ventricular ejection fraction, by estimation, is 55 to 60%. The  left ventricle has normal  function.   2. Right ventricular systolic function is normal. The right ventricular  size is normal.   3. Left atrial size was mildly dilated. No left atrial/left atrial  appendage thrombus was detected.   4. The mitral valve is normal in structure. Mild mitral valve  regurgitation.   5. The aortic valve has been  repaired/replaced. Aortic valve  regurgitation is not visualized. There is a Sapien prosthetic (TAVR) valve  present in the aortic position. Echo findings are consistent with normal  structure and function of the aortic valve  prosthesis.   Conclusion(s)/Recommendation(s): No evidence of vegetation/infective  endocarditis on this transesophageal  echocardiogram.  __________   2D echo 03/2021: 1. Left ventricular ejection fraction, by estimation, is >55%. The left  ventricle has normal function. Left ventricular endocardial border not  optimally defined to evaluate regional wall motion. Left ventricular  diastolic parameters are indeterminate.   2. Right ventricular systolic function is normal. The right ventricular  size is normal.   3. Left atrial size was mildly dilated.   4. The mitral valve was not well visualized. Mild mitral valve  regurgitation. No evidence of mitral stenosis.   5. The aortic valve was not well visualized. Aortic valve regurgitation  is not visualized. Mild aortic valve stenosis. Aortic valve mean gradient  measures 16.0 mmHg.   6. The inferior vena cava is dilated in size with <50% respiratory  variability, suggesting right atrial pressure of 15 mmHg.   Conclusion(s)/Recommendation(s): No definite evidence of endocarditis.  However, acoustic windows are suboptimal and transesophageal  echocardiogram should be considered in the setting of implanted cardiac  devices and MSSA bacteremia.   Laboratory Data:  Chemistry Recent Labs  Lab 10/30/21 1003 10/31/21 1013  NA 134* 136  K 4.4 3.5  CL 104 106  CO2 24 21*  GLUCOSE 102* 120*  BUN 33* 40*  CREATININE 1.42* 1.88*  CALCIUM 8.4* 7.3*  GFRNONAA 50* 36*  ANIONGAP 6 9    Recent Labs  Lab 10/30/21 1003 10/31/21 1013  PROT 7.1 5.4*  ALBUMIN 3.8 2.9*  AST 53* 352*  ALT 38 231*  ALKPHOS 87 58  BILITOT 0.7 0.6   Hematology Recent Labs  Lab 10/30/21 1003 10/31/21 1013  WBC 5.4 6.7  RBC  3.25* 3.06*  HGB 10.2* 9.7*  HCT 30.5* 28.3*  MCV 93.8 92.5  MCH 31.4 31.7  MCHC 33.4 34.3  RDW 13.4 13.4  PLT 110* 70*   Cardiac EnzymesNo results for input(s): TROPONINI in the last 168 hours. No results for input(s): TROPIPOC in the last 168 hours.  BNPNo results for input(s): BNP, PROBNP in the last 168 hours.  DDimer No results for input(s): DDIMER in the last 168 hours.  Radiology/Studies:  DG Chest 2 View  Result Date: 10/30/2021 IMPRESSION: No active cardiopulmonary disease. Electronically Signed   By: Keane Police D.O.   On: 10/30/2021 10:59   CT ABDOMEN PELVIS W CONTRAST  Result Date: 10/30/2021 IMPRESSION: No acute abnormality. Electronically Signed   By: Macy Mis M.D.   On: 10/30/2021 12:56   DG Chest Port 1 View  Result Date: 10/31/2021 IMPRESSION: Stable postoperative findings. No interval change or acute process by plain radiography. Electronically Signed   By: Jerilynn Mages.  Shick M.D.   On: 10/31/2021 10:57   Assessment and Plan:   1.  Recurrent MSSA bacteremia: -Strong suspicion for endocarditis with possible seeding of cardiac device, given this is now his third hospital admission with MSSA bacteremia -TEE in 05/2021 showed  a small 5 x 3 mm calcified/fibrinous structure in the right atrium attached to the pacemaker lead -Plan for TEE on 11/03/2021, n.p.o. at midnight night prior -We will need consult by EP for further discussion regarding device explantation -ABX per IM and ID  2.  CAD involving the native coronary arteries with elevated high-sensitivity troponin: -Mildly elevated high-sensitivity troponin, currently trending to 277 -No symptoms suggestive of angina at this time -No indication for heparin drip without dynamic troponin elevation -Echo demonstrated preserved LV systolic function -At this time, no plans for inpatient ischemic evaluation -Likely supply demand ischemia in the setting of known CAD with MSSA bacteremia, hypotension, renal  dysfunction, and anemia -Continue PTA medications as BP allows  3.  Aortic stenosis status post TAVR: -Normal structure and function of aortic valve prosthesis by echo today -SBE prophylaxis in the outpatient setting -Plan for TEE early next week as outlined above  4.  Sick sinus syndrome status post dual-chamber PPM: -Device appears to be functioning normally -He has now been admitted 3 times in 2022 with MSSA bacteremia -Recommend EP consult next week for further discussion regarding possible device explantation, pending TEE    Shared Decision Making/Informed Consent{  The risks [esophageal damage, perforation (1:10,000 risk), bleeding, pharyngeal hematoma as well as other potential complications associated with conscious sedation including aspiration, arrhythmia, respiratory failure and death], benefits (treatment guidance and diagnostic support) and alternatives of a transesophageal echocardiogram were discussed in detail with Mr. Winkels and he is willing to proceed.     For questions or updates, please contact [place] Please consult [email or web address] for contact info under Cardiology/STEMI.   Signed, Christell Faith, PA-C [place] Pager: [phone] 10/31/2021, 2:57 PM

## 2021-10-31 NOTE — H&P (Signed)
History and Physical    Nathan Singleton HKV:[id number] DOB: 30-May-1941 DOA: 10/31/2021  PCP: Ranae Plumber, PA (Confirm with patient/family/NH records and if not entered, this has to be entered at [facility] point of entry) Patient coming from: Home  I have personally briefly reviewed patient's old medical records in [place]  Chief Complaint: Feeling better  HPI: Kalmen Lollar is a 80 y.o. male with medical history significant of CAD with stenting, SSS status post PPM, aortic stenosis status post TAVR 2019, HTN, HLD, COPD, PVD, chronic anemia, CKD stage IIIa, BPH, presented with fever.  Is hard hearing, history collected by interviewing with the wife at bedside.  Wife reported that patient at baseline unsteady gait, " easily bumped into doors andwalls at home".  1 week ago, patient bumped his left arm against a door and sustained a skin tear on left forearm.  Wife cleaned it the wound with water soap and hydrogen peroxide and then wrapped with bandages.  And starting from 3 to 4 days ago patient started with episode of chills and subjective fever, last night patient had a wound to fever and wife brought her to the hospital.  ED work-up including blood culture were drawn and patient sent home with Keflex to treat the left arm open wound infection.  Overnight, patient spiked fever of 103 and wife brought him back to the ED.  ED Course: Patient was found hypotensive, responded to 2 L of IV boluses.  Blood culture from yesterday's ED visit showed MSSA.  Patient was started on Ancef.  Blood pressure recovers after IV boluses however patient started to feel nauseous and chest pain.  Troponin first set 277.  CMP of yesterday's ED visit showed elevated LFTs, CT abdomen pelvis with contrast yesterday did not show any significant gallbladder or CBD etiologies.  Review of Systems: Unable to perform, patient extremely hard of hearing  Past Medical History:  Diagnosis Date   Aneurysm  (arteriovenous) of coronary vessels    Arthritis    Ascending aortic aneurysm    CAD (coronary artery disease)    Diabetes mellitus without complication ([place])    Hard of hearing    History of GI bleed    Hyperlipidemia associated with type 2 diabetes mellitus ([place])    Hypertension associated with diabetes ([place])    Presence of permanent cardiac pacemaker    PVD (peripheral vascular disease) ([place])    S/P TAVR (transcatheter aortic valve replacement)     Past Surgical History:  Procedure Laterality Date   AMPUTATION TOE Right 03/20/2021   Procedure: AMPUTATION TOE;  Surgeon: Sharlotte Alamo, DPM;  Location: [facility] ORS;  Service: Podiatry;  Laterality: Right;   AMPUTATION TOE Right 06/03/2021   Procedure: AMPUTATION TOE-SECOND TOE;  Surgeon: [name], DPM;  Location: [facility] ORS;  Service: Podiatry;  Laterality: Right;   AMPUTATION TOE Right 07/26/2021   Procedure: AMPUTATION TOE-THIRD TOE;  Surgeon: [name], DPM;  Location: [facility] ORS;  Service: Podiatry;  Laterality: Right;   Ankle repaired Right    Carpal Tunnel repaired Left    CATARACT EXTRACTION Bilateral    COLONOSCOPY     Copillar implant      DG CHOLECYSTOGRAPHY GALL BLADDER ([facility] HX)     GASTRIC BYPASS     heart valve replaced     IRRIGATION AND DEBRIDEMENT FOOT Right 02/18/2021   Procedure: IRRIGATION AND DEBRIDEMENT FOOT-Right Great Toe;  Surgeon: [name], DPM;  Location: [facility] OR[facility];  Service: Podiatry;  Laterality: Right;   LOWER EXTREMITY  ANGIOGRAPHY Right 02/17/2021   Procedure: Lower Extremity Angiography;  Surgeon: Algernon Huxley, MD;  Location: [facility];  Service: Cardiovascular;  Laterality: Right;   LOWER EXTREMITY ANGIOGRAPHY Right 03/27/2021   Procedure: Lower Extremity Angiography;  Surgeon: Algernon Huxley, MD;  Location: [facility];  Service: Cardiovascular;  Laterality: Right;   PACEMAKER GENERATOR CHANGE     REPLACEMENT TOTAL KNEE BILATERAL     RTC     STENT PLACE LEFT URETER ([place] [facility])  Right    Right Leg   TEE WITHOUT CARDIOVERSION N/A 03/26/2021   Procedure: TRANSESOPHAGEAL ECHOCARDIOGRAM (TEE);  Surgeon: Kate Sable, MD;  Location: [facility];  Service: Cardiovascular;  Laterality: N/A;   TEE WITHOUT CARDIOVERSION N/A 06/04/2021   Procedure: TRANSESOPHAGEAL ECHOCARDIOGRAM (TEE);  Surgeon: Kate Sable, MD;  Location: [facility];  Service: Cardiovascular;  Laterality: N/A;   Toe nail removed Bilateral      reports that he has quit smoking. He has never used smokeless tobacco. He reports that he does not currently use alcohol. He reports that he does not currently use drugs.  Allergies  Allergen Reactions   Atorvastatin Other (See Comments)    "bad for kidneys" Cramping    Celecoxib Other (See Comments)    Other reaction(s): Other (See Comments) Kidney Problem Kidney Problem    Dicyclomine Hcl Other (See Comments)    Stomach cramps   Doxycycline Other (See Comments)    Per wife (per RN)- pt gets severe stomach pains even when takes with food and PCP said to not take     Metformin Other (See Comments)    Family History  Problem Relation Age of Onset   Heart disease Mother    Alzheimer's disease Mother    Heart disease Father    Diabetes Father     Prior to Admission medications   Medication Sig Start Date End Date Taking? Authorizing Provider  acetaminophen (TYLENOL) 325 MG tablet Take 2 tablets (650 mg total) by mouth every 6 (six) hours as needed for fever, headache or moderate pain. 05/09/21   Loletha Grayer, MD  albuterol (VENTOLIN HFA) 108 (90 Base) MCG/ACT inhaler Inhale 2 puffs into the lungs every 4 (four) hours as needed for wheezing or shortness of breath. 05/09/21   Loletha Grayer, MD  allopurinol (ZYLOPRIM) 100 MG tablet Take 100 mg by mouth daily. 10/19/20   [provider]  amLODipine (NORVASC) 10 MG tablet Take 1 tablet (10 mg total) by mouth daily. 05/28/21 10/17/21  Nolberto Hanlon, MD  BAYER ASPIRIN EC LOW DOSE 81 MG EC  tablet Take 81 mg by mouth daily. 11/11/20   [provider]  CALCIUM CITRATE+D3 PETITES 200-250 MG-UNIT TABS Take by mouth. 07/10/21   [provider]  carvedilol (COREG) 6.25 MG tablet Take 6.25 mg by mouth 2 (two) times daily. 07/08/21   [provider]  cephALEXin (KEFLEX) 500 MG capsule Take 1 capsule (500 mg total) by mouth 4 (four) times daily for 7 days. 10/30/21 11/06/21  Arta Silence, MD  Cholecalciferol (VITAMIN D3) 125 MCG (5000 UT) CAPS Take 5,000 Units by mouth daily. 05/09/21   [provider]  clopidogrel (PLAVIX) 75 MG tablet Take 1 tablet (75 mg total) by mouth daily. Patient not taking: No sig reported 02/22/21   Charlynne Cousins, MD  gabapentin (NEURONTIN) 100 MG capsule Take 100 mg by mouth daily. 01/29/21   [provider]  Insulin Glargine-yfgn 100 UNIT/ML SOPN Inject 18 Units into the skin at  bedtime. 05/09/21   [provider]  isosorbide mononitrate (IMDUR) 30 MG 24 hr tablet Take 30 mg by mouth daily. 02/12/21   [provider]  lactulose, encephalopathy, (CHRONULAC) 10 GM/15ML SOLN Take 15 mLs by mouth 2 (two) times daily. 10/08/21   [provider]  loperamide (IMODIUM) 2 MG capsule Take 1 capsule (2 mg total) by mouth as needed for diarrhea or loose stools. 03/31/21   Blake Divine, MD  [facility] 17 GM/SCOOP powder Take by mouth. 10/08/21   [provider]  multivitamin (ONE-A-DAY MEN'S) TABS tablet Take 1 tablet by mouth daily. 11/11/20   [provider]  pantoprazole (PROTONIX) 40 MG tablet Take 1 tablet (40 mg total) by mouth daily. 02/11/21   [name], MD  rosuvastatin (CRESTOR) 40 MG tablet Take 40 mg by mouth daily. 02/13/21   [provider]  sertraline (ZOLOFT) 100 MG tablet Take 200 mg by mouth daily. 11/11/20   [provider]  sucralfate (CARAFATE) 1 g tablet Take 1 g by mouth 4 (four) times daily. 10/08/21   [provider]  SYMBICORT 80-4.5  MCG/ACT inhaler Inhale 2 puffs into the lungs 2 (two) times daily. 09/08/21   [provider]  tamsulosin (FLOMAX) 0.4 MG CAPS capsule Take 0.4 mg by mouth daily. 10/07/21   [provider]  VICTOZA 18 MG/3ML SOPN Inject 1.8 mg into the skin daily. 10/07/21   [provider]    Physical Exam: Vitals:   10/31/21 1200 10/31/21 1230 10/31/21 1300 10/31/21 1317  BP: 132/65 128/67 (!) 157/90   Pulse: 62 66 (!) 108   Resp: 20 (!) 23 (!) 31   Temp:    98.8 F (37.1 C)  TempSrc:    Oral  SpO2: 96% 96% (!) 88%   Weight:      Height:        Constitutional: NAD, calm, comfortable Vitals:   10/31/21 1200 10/31/21 1230 10/31/21 1300 10/31/21 1317  BP: 132/65 128/67 (!) 157/90   Pulse: 62 66 (!) 108   Resp: 20 (!) 23 (!) 31   Temp:    98.8 F (37.1 C)  TempSrc:    Oral  SpO2: 96% 96% (!) 88%   Weight:      Height:       Eyes: PERRL, lids and conjunctivae normal ENMT: Mucous membranes are moist. Posterior pharynx clear of any exudate or lesions.Normal dentition.  Neck: normal, supple, no masses, no thyromegaly Respiratory: clear to auscultation bilaterally, no wheezing, no crackles. Normal respiratory effort. No accessory muscle use.  Cardiovascular: Regular rate and rhythm, no murmurs / rubs / gallops. No extremity edema. 2+ pedal pulses. No carotid bruits.  Abdomen: no tenderness, no masses palpated. No hepatosplenomegaly. Bowel sounds positive.  Musculoskeletal: no clubbing / cyanosis. No joint deformity upper and lower extremities. Good ROM, no contractures. Normal muscle tone.  Skin: left arm skin tear and multiple bruises Neurologic: CN 2-12 grossly intact. Sensation intact, DTR normal. Strength 5/5 in all 4.  Psychiatric: Normal judgment and insight. Alert and oriented x 3. Normal mood.     Labs on Admission: I have personally reviewed following labs and imaging studies  CBC: Recent Labs  Lab 10/30/21 1003 10/31/21 1013  WBC 5.4 6.7  NEUTROABS  4.9 6.4  HGB 10.2* 9.7*  HCT 30.5* 28.3*  MCV 93.8 92.5  PLT 110* 70*   Basic Metabolic Panel: Recent Labs  Lab 10/30/21 1003 10/31/21 1013  NA 134* 136  K 4.4 3.5  CL 104 106  CO2 24 21*  GLUCOSE 102* 120*  BUN 33* 40*  CREATININE 1.42* 1.88*  CALCIUM 8.4* 7.3*   GFR: Estimated Creatinine Clearance: 39.1 mL/min (A) (by C-G formula based on SCr of 1.88 mg/dL (H)). Liver Function Tests: Recent Labs  Lab 10/30/21 1003 10/31/21 1013  AST 53* 352*  ALT 38 231*  ALKPHOS 87 58  BILITOT 0.7 0.6  PROT 7.1 5.4*  ALBUMIN 3.8 2.9*   No results for input(s): LIPASE, AMYLASE in the last 168 hours. No results for input(s): AMMONIA in the last 168 hours. Coagulation Profile: Recent Labs  Lab 10/30/21 1003 10/31/21 1013  INR 1.1 1.5*   Cardiac Enzymes: No results for input(s): CKTOTAL, CKMB, CKMBINDEX, TROPONINI in the last 168 hours. BNP (last 3 results) No results for input(s): PROBNP in the last 8760 hours. HbA1C: No results for input(s): HGBA1C in the last 72 hours. CBG: No results for input(s): GLUCAP in the last 168 hours. Lipid Profile: No results for input(s): CHOL, HDL, LDLCALC, TRIG, CHOLHDL, LDLDIRECT in the last 72 hours. Thyroid Function Tests: No results for input(s): TSH, T4TOTAL, FREET4, T3FREE, THYROIDAB in the last 72 hours. Anemia Panel: No results for input(s): VITAMINB12, FOLATE, FERRITIN, TIBC, IRON, RETICCTPCT in the last 72 hours. Urine analysis:    Component Value Date/Time   COLORURINE STRAW (A) 10/30/2021 1001   APPEARANCEUR CLEAR (A) 10/30/2021 1001   LABSPEC 1.009 10/30/2021 1001   PHURINE 5.0 10/30/2021 1001   GLUCOSEU NEGATIVE 10/30/2021 1001   HGBUR NEGATIVE 10/30/2021 1001   BILIRUBINUR NEGATIVE 10/30/2021 1001   KETONESUR NEGATIVE 10/30/2021 1001   PROTEINUR 100 (A) 10/30/2021 1001   NITRITE NEGATIVE 10/30/2021 1001   LEUKOCYTESUR NEGATIVE 10/30/2021 1001    Radiological Exams on Admission: DG Chest 2 View  Result Date:  10/30/2021 CLINICAL DATA:  Shortness of breath, fever EXAM: CHEST - 2 VIEW COMPARISON:  None. FINDINGS: The heart size and mediastinal contours are within normal limits. A prosthetic aortic valve is noted. Pacemaker leads terminate in the right atrium and right ventricle. Low lung volumes without evidence of focal consolidation or pleural effusion. The visualized skeletal structures are unremarkable. IMPRESSION: No active cardiopulmonary disease. Electronically Signed   By: Keane Police D.O.   On: 10/30/2021 10:59   CT ABDOMEN PELVIS W CONTRAST  Result Date: 10/30/2021 CLINICAL DATA:  Abdominal pain, fever EXAM: CT ABDOMEN AND PELVIS WITH CONTRAST TECHNIQUE: Multidetector CT imaging of the abdomen and pelvis was performed using the standard protocol following bolus administration of intravenous contrast. CONTRAST:  174mL OMNIPAQUE IOHEXOL 300 MG/ML  SOLN COMPARISON:  None. FINDINGS: Lower chest: No acute abnormality. Hepatobiliary: Multiple small calcified granulomas. Post cholecystectomy. No unexpected biliary dilatation. Pancreas: Atrophic but otherwise unremarkable. Spleen: Unremarkable. Adrenals/Urinary Tract: Adrenals are unremarkable. Too small to characterize low-density right renal lesions. Bladder is unremarkable. Stomach/Bowel: There is evidence of prior bariatric surgery. Bowel is normal in caliber. Normal appendix. Vascular/Lymphatic: Atherosclerotic calcifications. No enlarged lymph nodes. Reproductive: Prostate is unremarkable. Other: No free fluid.  No acute abnormality of the abdominal wall. Musculoskeletal: Advanced degenerative changes of the lumbar spine. IMPRESSION: No acute abnormality. Electronically Signed   By: Macy Mis M.D.   On: 10/30/2021 12:56   DG Chest Port 1 View  Result Date: 10/31/2021 CLINICAL DATA:  Concern for sepsis, fever EXAM: PORTABLE CHEST 1 VIEW COMPARISON:  10/30/2021 FINDINGS: Normal heart size and vascularity. Previous TAVR and left subclavian pacemaker  noted. No acute airspace process, collapse or consolidation. Negative for edema,  effusion or pneumothorax. IMPRESSION: Stable postoperative findings. No interval change or acute process by plain radiography. Electronically Signed   By: Jerilynn Mages.  Shick M.D.   On: 10/31/2021 10:57    EKG: Independently reviewed.  Chronic RBBB and nonspecific ST changes on V1.  Assessment/Plan Principal Problem:   Sepsis ([place]) Active Problems:   MSSA bacteremia  (please populate well all problems here in Problem List. (For example, if patient is on BP meds at home and you resume or decide to hold them, it is a problem that needs to be her. Same for CAD, COPD, HLD and so on)  Sepsis -Evidence of fever, hypotension which responded to IV fluid, cirrhosis and ascites a bacteremia from left arm skin tear infection. -Continue Ancef, infectious disease consulted. -Plans to continue Ancef, if bacteremia now cleared, will consider endocarditis work-up.  Will discuss with infectious disease.  Elevated troponins -Trend second set of troponins -Etiology demanding ischemia secondary to sepsis versus NSTEMI -ASA for now, will discuss with cardiology regarding Plavix and heparin drip. -Most of his BP medication on hold for sepsis reason. -Echo  Acute transaminitis -Suspect secondary to shock liver -Image study referring and no RUQ abdominal pain. -Recheck LFTs tomorrow.  HTN -Hold home BP meds, -PRN Hydralazine for now.  HLD -Hold Statin until LFTs improves.  IDDM -Lantus, and sliding scale.  GERD, BPH, COPD -Continue home meds.  DVT prophylaxis: Heparin subcu Code Status: Full code Family Communication: Wife at bedside Disposition Plan: Expect more than 2 midnight hospital stay, to treat sepsis and possible NSTEMI. Consults called: ID and cardiology Admission status: PCU   Lequita Halt MD [facility] Pager [phone]  10/31/2021, 1:26 PM

## 2021-10-31 NOTE — Progress Notes (Signed)
Nurse reports bladder scan >700, straight cath x1 and bladder scan Q shift for one day.

## 2021-10-31 NOTE — Progress Notes (Signed)
Pharmacy Antibiotic Note  Nathan Singleton is a 80 y.o. male with past medical history of hypertension, diabetes, CAD, CKD, and peripheral vascular disease who presents to the ED for abnormal labs. Pt was seen in ED 11/17 d/t fever and 1 week of worsening arm pain w/ skin tears on forearms. Pt was sent home on Keflex. Since then, pt blood cultures from 11/17 came back with 4/4 bottles positive for MSSA and pt was called back into ED for reevaluation. Pharmacy has been consulted for cefazolin dosing bacteremia.  Plan: Cefazolin 2 g IV q8h Monitor renal function and adjust dose as clinically indicated Follow up repeat blood cultures  Height: 6\' 4"  (193 cm) Weight: 99.5 kg (219 lb 5.7 oz) IBW/kg (Calculated) : 86.8  Temp (24hrs), Avg:99.7 F (37.6 C), Min:99.7 F (37.6 C), Max:99.7 F (37.6 C)  Recent Labs  Lab 10/30/21 1001 10/30/21 1003 10/31/21 1011 10/31/21 1013  WBC  --  5.4  --  6.7  CREATININE  --  1.42*  --  1.88*  LATICACIDVEN 1.1  --  1.3  1.3  --     Estimated Creatinine Clearance: 39.1 mL/min (A) (by C-G formula based on SCr of 1.88 mg/dL (H)).    Allergies  Allergen Reactions   Atorvastatin Other (See Comments)    "bad for kidneys" Cramping    Celecoxib Other (See Comments)    Other reaction(s): Other (See Comments) Kidney Problem Kidney Problem    Dicyclomine Hcl Other (See Comments)    Stomach cramps   Doxycycline Other (See Comments)    Per wife (per RN)- pt gets severe stomach pains even when takes with food and PCP said to not take     Metformin Other (See Comments)    Antimicrobials this admission: 11/18 cefazolin >>    Microbiology results: 11/17 Bcx: 4/4 bottles MSSA 11/18 Bcx: sent  Thank you for allowing pharmacy to be a part of this patient's care.  Tenleigh Byer O Fenton Candee 10/31/2021 1:01 PM

## 2021-10-31 NOTE — Progress Notes (Signed)
Trop trending 277>286, no chest pain, repeat EKG no acute ST-T changes. Likely trop elevation 2/2 sepsis.

## 2021-10-31 NOTE — Progress Notes (Signed)
ED Antimicrobial Stewardship Positive Culture Follow Up   Nathan Singleton is an 80 y.o. male who presented to [facility] on 11/17 with a chief complaint of fever and possible skin infection  Recent Results (from the past 720 hour(s))  Resp Panel by RT-PCR (Flu A&B, Covid) Nasopharyngeal Swab     Status: None   Collection Time: 10/17/21  4:43 AM   Specimen: Nasopharyngeal Swab; Nasopharyngeal(NP) swabs in vial transport medium  Result Value Ref Range Status   SARS Coronavirus 2 by RT PCR NEGATIVE NEGATIVE Final    Comment: (NOTE) SARS-CoV-2 target nucleic acids are NOT DETECTED.  The SARS-CoV-2 RNA is generally detectable in upper respiratory specimens during the acute phase of infection. The lowest concentration of SARS-CoV-2 viral copies this assay can detect is 138 copies/mL. A negative result does not preclude SARS-Cov-2 infection and should not be used as the sole basis for treatment or other patient management decisions. A negative result may occur with  improper specimen collection/handling, submission of specimen other than nasopharyngeal swab, presence of viral mutation(s) within the areas targeted by this assay, and inadequate number of viral copies(<138 copies/mL). A negative result must be combined with clinical observations, patient history, and epidemiological information. The expected result is Negative.  Fact Sheet for Patients:  EntrepreneurPulse.com.au  Fact Sheet for Healthcare Providers:  IncredibleEmployment.be  This test is no t yet approved or cleared by the [place] FDA and  has been authorized for detection and/or diagnosis of SARS-CoV-2 by FDA under an Emergency Use Authorization (EUA). This EUA will remain  in effect (meaning this test can be used) for the duration of the COVID-19 declaration under Section 564(b)(1) of the Act, 21 U.S.C.section 360bbb-3(b)(1), unless the authorization is terminated  or revoked  sooner.       Influenza A by PCR NEGATIVE NEGATIVE Final   Influenza B by PCR NEGATIVE NEGATIVE Final    Comment: (NOTE) The Xpert Xpress SARS-CoV-2/FLU/RSV plus assay is intended as an aid in the diagnosis of influenza from Nasopharyngeal swab specimens and should not be used as a sole basis for treatment. Nasal washings and aspirates are unacceptable for Xpert Xpress SARS-CoV-2/FLU/RSV testing.  Fact Sheet for Patients: EntrepreneurPulse.com.au  Fact Sheet for Healthcare Providers: IncredibleEmployment.be  This test is not yet approved or cleared by the [place] FDA and has been authorized for detection and/or diagnosis of SARS-CoV-2 by FDA under an Emergency Use Authorization (EUA). This EUA will remain in effect (meaning this test can be used) for the duration of the COVID-19 declaration under Section 564(b)(1) of the Act, 21 U.S.C. section 360bbb-3(b)(1), unless the authorization is terminated or revoked.  Performed at [facility], [address] [place], [place] [address]   CULTURE, BLOOD (ROUTINE X 2) w Reflex to ID Panel     Status: None   Collection Time: 10/17/21  2:51 PM   Specimen: BLOOD  Result Value Ref Range Status   Specimen Description BLOOD BLOOD LEFT WRIST  Final   Special Requests   Final    BOTTLES DRAWN AEROBIC AND ANAEROBIC Blood Culture adequate volume   Culture   Final    NO GROWTH 5 DAYS Performed at [facility], [address], [place], [place] [address]    Report Status 10/22/2021 FINAL  Final  CULTURE, BLOOD (ROUTINE X 2) w Reflex to ID Panel     Status: None   Collection Time: 10/17/21  2:51 PM   Specimen: BLOOD  Result Value Ref Range Status  Specimen Description BLOOD BLOOD RIGHT ARM  Final   Special Requests   Final    BOTTLES DRAWN AEROBIC AND ANAEROBIC Blood Culture adequate volume   Culture   Final    NO GROWTH 5 DAYS Performed at [facility], [address] [place], [place] [address]    Report Status 10/22/2021 FINAL  Final  Culture, blood (Routine x 2)     Status: None (Preliminary result)   Collection Time: 10/30/21  9:51 AM   Specimen: BLOOD  Result Value Ref Range Status   Specimen Description BLOOD LEFT ANTECUBITAL  Final   Special Requests   Final    BOTTLES DRAWN AEROBIC AND ANAEROBIC Blood Culture adequate volume   Culture  Setup Time   Final    GRAM POSITIVE COCCI IN BOTH AEROBIC AND ANAEROBIC BOTTLES CRITICAL VALUE NOTED.  VALUE IS CONSISTENT WITH PREVIOUSLY REPORTED AND CALLED VALUE. Performed at [facility], [address] [place], [place] [address]    Culture [facility] POSITIVE COCCI  Final   Report Status PENDING  Incomplete  Culture, blood (Routine x 2)     Status: None (Preliminary result)   Collection Time: 10/30/21 10:02 AM   Specimen: BLOOD  Result Value Ref Range Status   Specimen Description BLOOD LEFT WRIST  Final   Special Requests   Final    BOTTLES DRAWN AEROBIC AND ANAEROBIC Blood Culture results may not be optimal due to an excessive volume of blood received in culture bottles   Culture  Setup Time   Final    Organism ID to follow GRAM POSITIVE COCCI IN BOTH AEROBIC AND ANAEROBIC BOTTLES CRITICAL RESULT CALLED TO, READ BACK BY AND VERIFIED WITH: Leonides Schanz RN [id number] 10/30/21 HNM Performed at [facility], [address] [place],  [address]    Culture GRAM POSITIVE COCCI  Final   Report Status PENDING  Incomplete  Blood Culture ID Panel (Reflexed)     Status: Abnormal   Collection Time: 10/30/21 10:02 AM  Result Value Ref Range Status   Enterococcus faecalis NOT DETECTED NOT DETECTED Final   Enterococcus Faecium NOT DETECTED NOT DETECTED Final   Listeria monocytogenes NOT DETECTED NOT DETECTED Final   Staphylococcus species DETECTED (A) NOT DETECTED Final    Comment: CRITICAL RESULT CALLED TO, READ BACK BY AND VERIFIED WITH: Leonides Schanz RN [id number] 10/30/21 HNM     Staphylococcus aureus (BCID) DETECTED (A) NOT DETECTED Final    Comment: CRITICAL RESULT CALLED TO, READ BACK BY AND VERIFIED WITH: Leonides Schanz RN 2239 10/30/21 HNM    Staphylococcus epidermidis NOT DETECTED NOT DETECTED Final   Staphylococcus lugdunensis NOT DETECTED NOT DETECTED Final   Streptococcus species NOT DETECTED NOT DETECTED Final   Streptococcus agalactiae NOT DETECTED NOT DETECTED Final   Streptococcus pneumoniae NOT DETECTED NOT DETECTED Final   Streptococcus pyogenes NOT DETECTED NOT DETECTED Final   A.calcoaceticus-baumannii NOT DETECTED NOT DETECTED Final   Bacteroides fragilis NOT DETECTED NOT DETECTED Final   Enterobacterales NOT DETECTED NOT DETECTED Final   Enterobacter cloacae complex NOT DETECTED NOT DETECTED Final   Escherichia coli NOT DETECTED NOT DETECTED Final   Klebsiella aerogenes NOT DETECTED NOT DETECTED Final   Klebsiella oxytoca NOT DETECTED NOT DETECTED Final   Klebsiella pneumoniae NOT DETECTED NOT DETECTED Final   Proteus species NOT DETECTED NOT DETECTED Final   Salmonella species NOT DETECTED NOT DETECTED Final   Serratia marcescens NOT DETECTED NOT DETECTED Final   Haemophilus influenzae NOT DETECTED NOT DETECTED  Final   Neisseria meningitidis NOT DETECTED NOT DETECTED Final   Pseudomonas aeruginosa NOT DETECTED NOT DETECTED Final   Stenotrophomonas maltophilia NOT DETECTED NOT DETECTED Final   Candida albicans NOT DETECTED NOT DETECTED Final   Candida auris NOT DETECTED NOT DETECTED Final   Candida glabrata NOT DETECTED NOT DETECTED Final   Candida krusei NOT DETECTED NOT DETECTED Final   Candida parapsilosis NOT DETECTED NOT DETECTED Final   Candida tropicalis NOT DETECTED NOT DETECTED Final   Cryptococcus neoformans/gattii NOT DETECTED NOT DETECTED Final   Meth resistant mecA/C and MREJ NOT DETECTED NOT DETECTED Final    Comment: Performed at [facility], [address] [place], [place] [address]  Resp Panel by RT-PCR (Flu  A&B, Covid) Nasopharyngeal Swab     Status: None   Collection Time: 10/30/21 12:14 PM   Specimen: Nasopharyngeal Swab; Nasopharyngeal(NP) swabs in vial transport medium  Result Value Ref Range Status   SARS Coronavirus 2 by RT PCR NEGATIVE NEGATIVE Final    Comment: (NOTE) SARS-CoV-2 target nucleic acids are NOT DETECTED.  The SARS-CoV-2 RNA is generally detectable in upper respiratory specimens during the acute phase of infection. The lowest concentration of SARS-CoV-2 viral copies this assay can detect is 138 copies/mL. A negative result does not preclude SARS-Cov-2 infection and should not be used as the sole basis for treatment or other patient management decisions. A negative result may occur with  improper specimen collection/handling, submission of specimen other than nasopharyngeal swab, presence of viral mutation(s) within the areas targeted by this assay, and inadequate number of viral copies(<138 copies/mL). A negative result must be combined with clinical observations, patient history, and epidemiological information. The expected result is Negative.  Fact Sheet for Patients:  EntrepreneurPulse.com.au  Fact Sheet for Healthcare Providers:  IncredibleEmployment.be  This test is no t yet approved or cleared by the [place] FDA and  has been authorized for detection and/or diagnosis of SARS-CoV-2 by FDA under an Emergency Use Authorization (EUA). This EUA will remain  in effect (meaning this test can be used) for the duration of the COVID-19 declaration under Section 564(b)(1) of the Act, 21 U.S.C.section 360bbb-3(b)(1), unless the authorization is terminated  or revoked sooner.       Influenza A by PCR NEGATIVE NEGATIVE Final   Influenza B by PCR NEGATIVE NEGATIVE Final    Comment: (NOTE) The Xpert Xpress SARS-CoV-2/FLU/RSV plus assay is intended as an aid in the diagnosis of influenza from Nasopharyngeal swab specimens  and should not be used as a sole basis for treatment. Nasal washings and aspirates are unacceptable for Xpert Xpress SARS-CoV-2/FLU/RSV testing.  Fact Sheet for Patients: EntrepreneurPulse.com.au  Fact Sheet for Healthcare Providers: IncredibleEmployment.be  This test is not yet approved or cleared by the [place] FDA and has been authorized for detection and/or diagnosis of SARS-CoV-2 by FDA under an Emergency Use Authorization (EUA). This EUA will remain in effect (meaning this test can be used) for the duration of the COVID-19 declaration under Section 564(b)(1) of the Act, 21 U.S.C. section 360bbb-3(b)(1), unless the authorization is terminated or revoked.  Performed at [facility], [address] [place], [place] [address]   Resp Panel by RT-PCR (Flu A&B, Covid) Nasopharyngeal Swab     Status: None   Collection Time: 10/31/21 10:11 AM   Specimen: Nasopharyngeal Swab; Nasopharyngeal(NP) swabs in vial transport medium  Result Value Ref Range Status   SARS Coronavirus 2 by RT PCR NEGATIVE NEGATIVE Final    Comment: (NOTE)  SARS-CoV-2 target nucleic acids are NOT DETECTED.  The SARS-CoV-2 RNA is generally detectable in upper respiratory specimens during the acute phase of infection. The lowest concentration of SARS-CoV-2 viral copies this assay can detect is 138 copies/mL. A negative result does not preclude SARS-Cov-2 infection and should not be used as the sole basis for treatment or other patient management decisions. A negative result may occur with  improper specimen collection/handling, submission of specimen other than nasopharyngeal swab, presence of viral mutation(s) within the areas targeted by this assay, and inadequate number of viral copies(<138 copies/mL). A negative result must be combined with clinical observations, patient history, and epidemiological information. The expected result is Negative.  Fact  Sheet for Patients:  EntrepreneurPulse.com.au  Fact Sheet for Healthcare Providers:  IncredibleEmployment.be  This test is no t yet approved or cleared by the [place] FDA and  has been authorized for detection and/or diagnosis of SARS-CoV-2 by FDA under an Emergency Use Authorization (EUA). This EUA will remain  in effect (meaning this test can be used) for the duration of the COVID-19 declaration under Section 564(b)(1) of the Act, 21 U.S.C.section 360bbb-3(b)(1), unless the authorization is terminated  or revoked sooner.       Influenza A by PCR NEGATIVE NEGATIVE Final   Influenza B by PCR NEGATIVE NEGATIVE Final    Comment: (NOTE) The Xpert Xpress SARS-CoV-2/FLU/RSV plus assay is intended as an aid in the diagnosis of influenza from Nasopharyngeal swab specimens and should not be used as a sole basis for treatment. Nasal washings and aspirates are unacceptable for Xpert Xpress SARS-CoV-2/FLU/RSV testing.  Fact Sheet for Patients: EntrepreneurPulse.com.au  Fact Sheet for Healthcare Providers: IncredibleEmployment.be  This test is not yet approved or cleared by the [place] FDA and has been authorized for detection and/or diagnosis of SARS-CoV-2 by FDA under an Emergency Use Authorization (EUA). This EUA will remain in effect (meaning this test can be used) for the duration of the COVID-19 declaration under Section 564(b)(1) of the Act, 21 U.S.C. section 360bbb-3(b)(1), unless the authorization is terminated or revoked.  Performed at [facility], [address] [place], [place] [address]     [x]  Blood culture with GPC 2/2 sets, BCID detects MSSA.  Contacted patient and asks patient to return to ED for admission to treat bacteremia  ED Provider: Dr Aliene Altes, PharmD, BCPS.   Work Cell: [phone] 10/31/2021 2:13 PM

## 2021-11-01 ENCOUNTER — Encounter: Payer: Self-pay | Admitting: Internal Medicine

## 2021-11-01 DIAGNOSIS — N179 Acute kidney failure, unspecified: Secondary | ICD-10-CM

## 2021-11-01 LAB — COMPREHENSIVE METABOLIC PANEL
ALT: 141 U/L — ABNORMAL HIGH (ref 0–44)
AST: 219 U/L — ABNORMAL HIGH (ref 15–41)
Albumin: 2.5 g/dL — ABNORMAL LOW (ref 3.5–5.0)
Alkaline Phosphatase: 60 U/L (ref 38–126)
Anion gap: 7 (ref 5–15)
BUN: 50 mg/dL — ABNORMAL HIGH (ref 8–23)
CO2: 22 mmol/L (ref 22–32)
Calcium: 7.4 mg/dL — ABNORMAL LOW (ref 8.9–10.3)
Chloride: 102 mmol/L (ref 98–111)
Creatinine, Ser: 1.77 mg/dL — ABNORMAL HIGH (ref 0.61–1.24)
GFR, Estimated: 39 mL/min — ABNORMAL LOW (ref >60)
Glucose, Bld: 182 mg/dL — ABNORMAL HIGH (ref 70–99)
Potassium: 3.5 mmol/L (ref 3.5–5.1)
Sodium: 131 mmol/L — ABNORMAL LOW (ref 135–145)
Total Bilirubin: 0.5 mg/dL (ref 0.3–1.2)
Total Protein: 5.4 g/dL — ABNORMAL LOW (ref 6.5–8.1)

## 2021-11-01 LAB — CBC
HCT: 25.6 % — ABNORMAL LOW (ref 39.0–52.0)
Hemoglobin: 8.9 g/dL — ABNORMAL LOW (ref 13.0–17.0)
MCH: 31.4 pg (ref 26.0–34.0)
MCHC: 34.8 g/dL (ref 30.0–36.0)
MCV: 90.5 fL (ref 80.0–100.0)
Platelets: 37 10*3/uL — ABNORMAL LOW (ref 150–400)
RBC: 2.83 MIL/uL — ABNORMAL LOW (ref 4.22–5.81)
RDW: 13.4 % (ref 11.5–15.5)
WBC: 4.5 10*3/uL (ref 4.0–10.5)
nRBC: 0 % (ref 0.0–0.2)

## 2021-11-01 LAB — CBG MONITORING, ED
Glucose-Capillary: 161 mg/dL — ABNORMAL HIGH (ref 70–99)
Glucose-Capillary: 249 mg/dL — ABNORMAL HIGH (ref 70–99)

## 2021-11-01 LAB — GLUCOSE, CAPILLARY
Glucose-Capillary: 230 mg/dL — ABNORMAL HIGH (ref 70–99)
Glucose-Capillary: 248 mg/dL — ABNORMAL HIGH (ref 70–99)

## 2021-11-01 MED ORDER — FLUTICASONE FUROATE-VILANTEROL 100-25 MCG/ACT IN AEPB
1.0000 | INHALATION_SPRAY | Freq: Every day | RESPIRATORY_TRACT | Status: DC
  Administered 2021-11-01 – 2021-11-03 (×3): 1 via RESPIRATORY_TRACT
  Filled 2021-11-01 (×2): qty 28

## 2021-11-01 NOTE — ED Notes (Signed)
Linins, gown, and purewick changed. Chux applied.

## 2021-11-01 NOTE — ED Notes (Signed)
Dr Manuella Ghazi at beside

## 2021-11-01 NOTE — Assessment & Plan Note (Signed)
Continue IV antibiotic as per ID.  May need pacer removed depending on TEE scheduled for Monday 11/21

## 2021-11-01 NOTE — Assessment & Plan Note (Signed)
Close to baseline.  Creatinine of 1.7

## 2021-11-01 NOTE — ED Notes (Signed)
PT with pt at this time.

## 2021-11-01 NOTE — Progress Notes (Signed)
Progress Note    Nathan Singleton   UDJ:[id number]  DOB: 1941-01-19  DOA: 10/31/2021     1 Date of Service: 11/01/2021   Clinical Course  80 y.o. 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  11/19: TEE planned for 11/21, on IV antibiotics.  Platelets trending down.  Holding Plavix and subcu heparin.  Continue aspirin and start SCDs for DVT prophylaxis   Assessment and Plan * Sepsis ([place]) Continue IV antibiotic as per ID.  May need pacer removed depending on TEE scheduled for Monday 11/21  Anemia of chronic disease Hemoglobin 8.9.  Monitor closely.  No obvious bleeding  MSSA bacteremia Continue cefazolin.  TEE scheduled for Monday 11/21 after which decision to remove pacer will be made as ID is concerned about pacemaker wire to be the source of infection.  He will need long-term IV antibiotics.  He also has cochlear implant  Chronic kidney disease, stage 3a ([place]) Close to baseline.  Creatinine of 1.7  Thrombocytopenia ([facility]) Dropping platelets so holding Plavix and subcu heparin.  Continue aspirin.  Monitor platelets  Coronary artery disease involving coronary bypass graft of native heart with angina pectoris ([place]) Echo showing preserved LV systolic function.  No plan for ischemic work-up while in the hospital.  Cardiology following.  Holding Plavix due to dropping platelets.  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  Echo showing normal structure and function of aortic valve  prosthesis     Subjective:  Feeling tired.  Some urinary retention requiring in and out cath since yesterday  Objective Vitals:   11/01/21 1000 11/01/21 1200 11/01/21 1325 11/01/21 1330  BP: (!) 141/75 (!) 146/66  (!) 145/65  Pulse:  78 (!) 52 74  Resp: 20 (!) 23 18 (!) 24  Temp:      TempSrc:      SpO2:  100% 100% 100%  Weight:      Height:       99.5 kg  Vital signs were reviewed and unremarkable.   Exam Physical Exam   Eyes: PERRL, lids and conjunctivae normal ENMT: Mucous membranes are moist. Posterior pharynx clear of any exudate or lesions.Normal dentition.  Neck: normal, supple, no masses, no thyromegaly Respiratory: clear to auscultation bilaterally, no wheezing, no crackles. Normal respiratory effort. No accessory muscle use.  Cardiovascular: Regular rate and rhythm, no murmurs / rubs / gallops. No extremity edema. Abdomen: no tenderness, no masses palpated. No hepatosplenomegaly. Bowel sounds positive.  Musculoskeletal: no clubbing / cyanosis. No joint deformity upper and lower extremities. no contractures. Normal muscle tone.  Skin: left arm skin tear and multiple bruises Neurologic:  Alert and awake, nonfocal Psychiatric:  Normal mood and affect  Labs / Other Information My review of labs, imaging, notes and other tests is significant for Anemia, thrombocytopenia, elevated LFTs, elevated kidney function     Disposition Plan: Status is: Inpatient  Remains inpatient appropriate because: Sepsis with multiorgan failure, waiting for TEE on Monday after which he may need pacemaker removal depending on ID and cardiology input        Time  spent: 35 minutes [facility] 11/01/2021, 4:21 PM

## 2021-11-01 NOTE — Assessment & Plan Note (Signed)
TEE planned for Monday 11/21.  Echo showing normal structure and function of aortic valve prosthesis

## 2021-11-01 NOTE — Progress Notes (Addendum)
Progress Note  Patient Name: Alphonsus Doyel Date of Encounter: 11/01/2021  [facility] Cardiologist: Agustin Cree  Subjective   Patient feels weak this morning. No chest pain or SOB. Complains of a headache. AM labs pending  Inpatient Medications    Scheduled Meds:  allopurinol  100 mg Oral Daily   aspirin EC  81 mg Oral Daily   carvedilol  6.25 mg Oral BID WC   clopidogrel  75 mg Oral Daily   fluticasone furoate-vilanterol  1 puff Inhalation QHS   gabapentin  100 mg Oral Daily   heparin  5,000 Units Subcutaneous Q12H   insulin aspart  0-9 Units Subcutaneous TID WC   insulin glargine-yfgn  18 Units Subcutaneous QHS   lactulose  10 g Oral BID   pantoprazole  40 mg Oral Daily   polyethylene glycol  17 g Oral Daily   sertraline  200 mg Oral Daily   sucralfate  1 g Oral QID   tamsulosin  0.4 mg Oral Daily   Continuous Infusions:  sodium chloride 150 mL/hr at 11/01/21 0151    ceFAZolin (ANCEF) IV Stopped (11/01/21 0306)   PRN Meds: acetaminophen, albuterol, loperamide, ondansetron (ZOFRAN) IV   Vital Signs    Vitals:   11/01/21 0207 11/01/21 0231 11/01/21 0400 11/01/21 0600  BP: 124/84  122/68 (!) 158/75  Pulse:      Resp: (!) 21  (!) 24 15  Temp:  98 F (36.7 C)    TempSrc:  Axillary    SpO2:    100%  Weight:      Height:        Intake/Output Summary (Last 24 hours) at 11/01/2021 0726 Last data filed at 11/01/2021 0658 Gross per 24 hour  Intake 3541.67 ml  Output 2700 ml  Net 841.67 ml   Last 3 Weights 10/31/2021 10/30/2021 10/18/2021  Weight (lbs) 219 lb 5.7 oz 219 lb 5.7 oz 219 lb 5.7 oz  Weight (kg) 99.5 kg 99.5 kg 99.5 kg      Telemetry    NSR, HR 50-60s,PACs - Personally Reviewed  ECG    No new - Personally Reviewed  Physical Exam   GEN: No acute distress.   Neck: No JVD Cardiac: RRR, + murmur, no rubs, or gallops.  Respiratory: Clear to auscultation bilaterally. GI: Soft, nontender, non-distended  MS: trace lower leg edema; No  deformity. Neuro:  Nonfocal  Psych: Normal affect   Labs    High Sensitivity Troponin:   Recent Labs  Lab 10/17/21 0726 10/17/21 1112 10/17/21 1305 10/31/21 1210 10/31/21 1502  TROPONINIHS 94* 89* 81* 277* 286*     Chemistry Recent Labs  Lab 10/30/21 1003 10/31/21 1013  NA 134* 136  K 4.4 3.5  CL 104 106  CO2 24 21*  GLUCOSE 102* 120*  BUN 33* 40*  CREATININE 1.42* 1.88*  CALCIUM 8.4* 7.3*  PROT 7.1 5.4*  ALBUMIN 3.8 2.9*  AST 53* 352*  ALT 38 231*  ALKPHOS 87 58  BILITOT 0.7 0.6  GFRNONAA 50* 36*  ANIONGAP 6 9    Lipids No results for input(s): CHOL, TRIG, HDL, LABVLDL, LDLCALC, CHOLHDL in the last 168 hours.  Hematology Recent Labs  Lab 10/30/21 1003 10/31/21 1013  WBC 5.4 6.7  RBC 3.25* 3.06*  HGB 10.2* 9.7*  HCT 30.5* 28.3*  MCV 93.8 92.5  MCH 31.4 31.7  MCHC 33.4 34.3  RDW 13.4 13.4  PLT 110* 70*   Thyroid No results for input(s): TSH, FREET4 in the last 168  hours.  BNPNo results for input(s): BNP, PROBNP in the last 168 hours.  DDimer No results for input(s): DDIMER in the last 168 hours.   Radiology    DG Chest 2 View  Result Date: 10/30/2021 CLINICAL DATA:  Shortness of breath, fever EXAM: CHEST - 2 VIEW COMPARISON:  None. FINDINGS: The heart size and mediastinal contours are within normal limits. A prosthetic aortic valve is noted. Pacemaker leads terminate in the right atrium and right ventricle. Low lung volumes without evidence of focal consolidation or pleural effusion. The visualized skeletal structures are unremarkable. IMPRESSION: No active cardiopulmonary disease. Electronically Signed   By: Keane Police D.O.   On: 10/30/2021 10:59   CT ABDOMEN PELVIS W CONTRAST  Result Date: 10/30/2021 CLINICAL DATA:  Abdominal pain, fever EXAM: CT ABDOMEN AND PELVIS WITH CONTRAST TECHNIQUE: Multidetector CT imaging of the abdomen and pelvis was performed using the standard protocol following bolus administration of intravenous contrast. CONTRAST:   164mL OMNIPAQUE IOHEXOL 300 MG/ML  SOLN COMPARISON:  None. FINDINGS: Lower chest: No acute abnormality. Hepatobiliary: Multiple small calcified granulomas. Post cholecystectomy. No unexpected biliary dilatation. Pancreas: Atrophic but otherwise unremarkable. Spleen: Unremarkable. Adrenals/Urinary Tract: Adrenals are unremarkable. Too small to characterize low-density right renal lesions. Bladder is unremarkable. Stomach/Bowel: There is evidence of prior bariatric surgery. Bowel is normal in caliber. Normal appendix. Vascular/Lymphatic: Atherosclerotic calcifications. No enlarged lymph nodes. Reproductive: Prostate is unremarkable. Other: No free fluid.  No acute abnormality of the abdominal wall. Musculoskeletal: Advanced degenerative changes of the lumbar spine. IMPRESSION: No acute abnormality. Electronically Signed   By: Macy Mis M.D.   On: 10/30/2021 12:56   DG Chest Port 1 View  Result Date: 10/31/2021 CLINICAL DATA:  Concern for sepsis, fever EXAM: PORTABLE CHEST 1 VIEW COMPARISON:  10/30/2021 FINDINGS: Normal heart size and vascularity. Previous TAVR and left subclavian pacemaker noted. No acute airspace process, collapse or consolidation. Negative for edema, effusion or pneumothorax. IMPRESSION: Stable postoperative findings. No interval change or acute process by plain radiography. Electronically Signed   By: Jerilynn Mages.  Shick M.D.   On: 10/31/2021 10:57   ECHOCARDIOGRAM COMPLETE  Result Date: 10/31/2021    ECHOCARDIOGRAM REPORT   Patient Name:   JHASE CREPPEL Date of Exam: 10/31/2021 Medical Rec #:  [id number]      Height:       76.0 in Accession #:    [id number]     Weight:       219.4 lb Date of Birth:  1941/08/05      BSA:          2.304 m Patient Age:    80 years       BP:           157/90 mmHg Patient Gender: M              HR:           108 bpm. Exam Location:  [facility] Procedure: 2D Echo, Cardiac Doppler and Color Doppler Indications:     NSTEMI I21.4  History:         Patient has prior history  of Echocardiogram examinations, most                  recent 06/04/2021. CAD; Risk Factors:Diabetes and Dyslipidemia.                  S/P TAVR.  Sonographer:     Sherrie Sport Referring Phys:  [id number] Lequita Halt Diagnosing Phys: Kathlyn Sacramento MD  Sonographer Comments: Technically challenging study due to limited acoustic windows, no parasternal window, no subcostal window and suboptimal apical window. IMPRESSIONS  1. Left ventricular ejection fraction, by estimation, is 60 to 65%. The left ventricle has normal function. The left ventricle has no regional wall motion abnormalities. There is mild left ventricular hypertrophy. Left ventricular diastolic parameters are consistent with Grade I diastolic dysfunction (impaired relaxation).  2. Right ventricular systolic function is normal. The right ventricular size is normal. Tricuspid regurgitation signal is inadequate for assessing PA pressure.  3. Left atrial size was mildly dilated.  4. The mitral valve is normal in structure. No evidence of mitral valve regurgitation. No evidence of mitral stenosis.  5. The aortic valve was not well visualized. Aortic valve regurgitation is not visualized. No aortic stenosis is present. Echo findings are consistent with normal structure and function of the aortic valve prosthesis. Aortic valve mean gradient measures  6.5 mmHg. FINDINGS  Left Ventricle: Left ventricular ejection fraction, by estimation, is 60 to 65%. The left ventricle has normal function. The left ventricle has no regional wall motion abnormalities. The left ventricular internal cavity size was normal in size. There is  mild left ventricular hypertrophy. Left ventricular diastolic parameters are consistent with Grade I diastolic dysfunction (impaired relaxation). Right Ventricle: The right ventricular size is normal. No increase in right ventricular wall thickness. Right ventricular systolic function is normal. Tricuspid regurgitation signal is inadequate for  assessing PA pressure. Left Atrium: Left atrial size was mildly dilated. Right Atrium: Right atrial size was normal in size. Pericardium: There is no evidence of pericardial effusion. Mitral Valve: The mitral valve is normal in structure. No evidence of mitral valve regurgitation. No evidence of mitral valve stenosis. MV peak gradient, 7.0 mmHg. The mean mitral valve gradient is 3.0 mmHg. Tricuspid Valve: The tricuspid valve is normal in structure. Tricuspid valve regurgitation is not demonstrated. No evidence of tricuspid stenosis. Aortic Valve: The aortic valve was not well visualized. Aortic valve regurgitation is not visualized. No aortic stenosis is present. Aortic valve mean gradient measures 6.5 mmHg. Aortic valve peak gradient measures 9.5 mmHg. Aortic valve area, by VTI measures 2.72 cm. Echo findings are consistent with normal structure and function of the aortic valve prosthesis. Pulmonic Valve: The pulmonic valve was normal in structure. Pulmonic valve regurgitation is not visualized. No evidence of pulmonic stenosis. Aorta: The aortic root is normal in size and structure. Venous: The inferior vena cava was not well visualized. IAS/Shunts: No atrial level shunt detected by color flow Doppler.  LEFT VENTRICLE PLAX 2D LVIDd:         3.90 cm   Diastology LVIDs:         2.80 cm   LV e' medial:    5.22 cm/s LV PW:         1.10 cm   LV E/e' medial:  19.1 LV IVS:        1.30 cm   LV e' lateral:   9.46 cm/s LVOT diam:     2.00 cm   LV E/e' lateral: 10.5 LV SV:         64 LV SV Index:   28 LVOT Area:     3.14 cm  RIGHT VENTRICLE RV S prime:     14.90 cm/s TAPSE (M-mode): 3.9 cm LEFT ATRIUM             Index        RIGHT ATRIUM  Index LA diam:        3.30 cm 1.43 cm/m   RA Area:     21.80 cm LA Vol (A2C):   40.4 ml 17.54 ml/m  RA Volume:   69.00 ml  29.95 ml/m LA Vol (A4C):   53.8 ml 23.35 ml/m LA Biplane Vol: 49.2 ml 21.35 ml/m  AORTIC VALVE AV Area (Vmax):    1.97 cm AV Area (Vmean):   1.83 cm  AV Area (VTI):     2.72 cm AV Vmax:           154.00 cm/s AV Vmean:          114.050 cm/s AV VTI:            0.236 m AV Peak Grad:      9.5 mmHg AV Mean Grad:      6.5 mmHg LVOT Vmax:         96.60 cm/s LVOT Vmean:        66.600 cm/s LVOT VTI:          0.205 m LVOT/AV VTI ratio: 0.87  AORTA Ao Root diam: 3.50 cm MITRAL VALVE                TRICUSPID VALVE MV Area (PHT): 5.54 cm     TR Peak grad:   10.8 mmHg MV Area VTI:   2.95 cm     TR Vmax:        164.00 cm/s MV Peak grad:  7.0 mmHg MV Mean grad:  3.0 mmHg     SHUNTS MV Vmax:       1.32 m/s     Systemic VTI:  0.20 m MV Vmean:      83.4 cm/s    Systemic Diam: 2.00 cm MV Decel Time: 137 msec MV E velocity: 99.80 cm/s MV A velocity: 132.00 cm/s MV E/A ratio:  0.76 Kathlyn Sacramento MD Electronically signed by Kathlyn Sacramento MD Signature Date/Time: 10/31/2021/2:34:03 PM    Final     Cardiac Studies   2D echo 10/31/2021: 1. Left ventricular ejection fraction, by estimation, is 60 to 65%. The  left ventricle has normal function. The left ventricle has no regional  wall motion abnormalities. There is mild left ventricular hypertrophy.  Left ventricular diastolic parameters  are consistent with Grade I diastolic dysfunction (impaired relaxation).   2. Right ventricular systolic function is normal. The right ventricular  size is normal. Tricuspid regurgitation signal is inadequate for assessing  PA pressure.   3. Left atrial size was mildly dilated.   4. The mitral valve is normal in structure. No evidence of mitral valve  regurgitation. No evidence of mitral stenosis.   5. The aortic valve was not well visualized. Aortic valve regurgitation  is not visualized. No aortic stenosis is present. Echo findings are  consistent with normal structure and function of the aortic valve  prosthesis. Aortic valve mean gradient measures   6.5 mmHg. __________   TEE 05/2021: 1. Left ventricular ejection fraction, by estimation, is 60%. The left  ventricle has  normal function.   2. Right ventricular systolic function is normal. The right ventricular  size is normal.   3. Left atrial size was mildly dilated. No left atrial/left atrial  appendage thrombus was detected.   4. The mitral valve is normal in structure. Mild mitral valve  regurgitation.   5. The aortic valve has been repaired/replaced. Aortic valve  regurgitation is not visualized. Echo findings are consistent with normal  structure and function of the aortic valve prosthesis.   Conclusion(s)/Recommendation(s): There is a small 5 x 3 mm  calcified/fibrinous structure in the right atrium (image clip 31),  attached to the pacemaker lead. This was present on prior TEecho 2 months  ago. likely represents prior calcified vegetation vs  thrombus. no acute vegetation or endocarditis noted on this TEE. __________   TEE 03/2021: 1. Left ventricular ejection fraction, by estimation, is 55 to 60%. The  left ventricle has normal function.   2. Right ventricular systolic function is normal. The right ventricular  size is normal.   3. Left atrial size was mildly dilated. No left atrial/left atrial  appendage thrombus was detected.   4. The mitral valve is normal in structure. Mild mitral valve  regurgitation.   5. The aortic valve has been repaired/replaced. Aortic valve  regurgitation is not visualized. There is a Sapien prosthetic (TAVR) valve  present in the aortic position. Echo findings are consistent with normal  structure and function of the aortic valve  prosthesis.   Conclusion(s)/Recommendation(s): No evidence of vegetation/infective  endocarditis on this transesophageal  echocardiogram.  __________   2D echo 03/2021: 1. Left ventricular ejection fraction, by estimation, is >55%. The left  ventricle has normal function. Left ventricular endocardial border not  optimally defined to evaluate regional wall motion. Left ventricular  diastolic parameters are indeterminate.   2.  Right ventricular systolic function is normal. The right ventricular  size is normal.   3. Left atrial size was mildly dilated.   4. The mitral valve was not well visualized. Mild mitral valve  regurgitation. No evidence of mitral stenosis.   5. The aortic valve was not well visualized. Aortic valve regurgitation  is not visualized. Mild aortic valve stenosis. Aortic valve mean gradient  measures 16.0 mmHg.   6. The inferior vena cava is dilated in size with <50% respiratory  variability, suggesting right atrial pressure of 15 mmHg.   Conclusion(s)/Recommendation(s): No definite evidence of endocarditis.  However, acoustic windows are suboptimal and transesophageal  echocardiogram should be considered in the setting of implanted cardiac  devices and MSSA bacteremia.   Patient Profile     80 y.o. 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, recurrent MSSA bacteremia, anemia, SNHL status post cochlear implant, GERD, obesity status post gastric bypass in 08/2012, bipolar disorder, OSA, falls, and OA  who is being seen today for the evaluation of elevated troponin   Assessment & Plan    Recurrent MSSA bacteremia/Sepsis - third admission for bacteremia, strong suspicion for endocarditis from pacemaker - TEE 05/2021 showed a small structure in the right atrium attached to the pacemaker lead - Plan for EP consult Monday - abx per IM  CAD s/p remote PCI Elevated troponin - HS trop elevated up to 286 - No anginal symptoms - No indication for IV heparin - Echo showed preserved LVEF - elevated troponin likely demand ischemia in the setting of CAD with MSSA bacteremia, hypotension, renal dysfunction,  anemia - no plans for ischemic evaluation at this time - continue ASA, Coreg, Plavix - statin held for elevated LFTS>>restart when able  Aortic stenosis s/p TAVR - echo showed normal structure and function of valve - Plan  for TEE early next week  SSS s/p PPM - TEE next week as above - EP consult  for possible device explantation given bacteremia x3.   For questions or updates, please contact [place] Please consult [email or web address] for contact info under        Signed, Asherah Lavoy Ninfa Meeker, PA-C  11/01/2021, 7:26 AM

## 2021-11-01 NOTE — Hospital Course (Addendum)
80 y.o. 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  11/19: TEE planned for 11/21, on IV antibiotics.  Platelets trending down.  Holding Plavix and subcu heparin.  Continue aspirin and start SCDs for DVT prophylaxis 11/20: TEE tomorrow.  PT recommends SNF.  TOC aware 11/21: TEE negative 11/22: transferring the  hospital for EP consult and pacemaker extraction.

## 2021-11-01 NOTE — ED Notes (Signed)
Lab at bedside

## 2021-11-01 NOTE — Assessment & Plan Note (Signed)
Dropping platelets so holding Plavix and subcu heparin.  Continue aspirin.  Monitor platelets

## 2021-11-01 NOTE — Assessment & Plan Note (Signed)
Hemoglobin 8.9.  Monitor closely.  No obvious bleeding

## 2021-11-01 NOTE — Assessment & Plan Note (Signed)
Continue cefazolin.  TEE scheduled for Monday 11/21 after which decision to remove pacer will be made as ID is concerned about pacemaker wire to be the source of infection.  He will need long-term IV antibiotics.  He also has cochlear implant

## 2021-11-01 NOTE — ED Notes (Signed)
Called pharmacy for ancef re-send- medication not found near tube station, c-pod, or pt specific medication drawer

## 2021-11-01 NOTE — Evaluation (Addendum)
Physical Therapy Evaluation Patient Details Name: Nathan Singleton MRN: [id number] DOB: 02-06-1941 Today's Date: 11/01/2021  History of Present Illness  Pt admitted to [facility] on 10/31/21 for abnormal labs dx with sepsis. Pt with recent ED admission 11/17 for fever and 1 week worsening arm pain following fall with skin tears to bil forearms; pt DC home with Keflex to cover for cellulitis. Lab work up was positive for MSSA and was called to return to ED. 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 Elevated troponin likely due to demand ischemia in setting of CAD with MSSA bacteremia, hypotension, renal dysfunction, and anemia. Significant PMH includes: HTN, diabetes, CAD, CKD, and PVD.    Clinical Impression  Pt is a 80 year old M admitted to hospital on 10/31/21 for sepsis. At baseline, pt was mod I for ADL's and limited ambulation with RW/SPC; splits IADL's with fiancee; fiance provides transportation and medication management. 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 Due to deficits, pt required mod assist for bed mobility, CGA for transfers, and CGA for limited gait at bedside with RW. Pt notes significant improvement in overall functional mobility since admission to hospital, however, states he is not close to his baseline. Deficits limit the pt's ability to safely and independently perform ADL's, transfer, and ambulate. Pt will benefit from acute skilled PT services to address deficits for return to baseline function. At this time, PT recommends SNF at DC to address deficits and improve overall safety with functional mobility prior to return home. Pt will also benefit from bariatric 3in1, as his current 3in1 is too small for him to use.  Pt and fiancee agreeable for PT recommendation.        Recommendations for follow up therapy are one component of a multi-disciplinary discharge planning process, led by the attending physician.  Recommendations may be updated based on patient status, additional functional criteria and insurance authorization.  Follow Up Recommendations Skilled nursing-short term rehab (<3 hours/day)    Assistance Recommended at Discharge Intermittent Supervision/Assistance  Functional Status Assessment Patient has had a recent decline in their functional status and demonstrates the ability to make significant improvements in function in a reasonable and predictable amount of time.  Equipment Recommendations  BSC/3in1 (bariatric 3in1)       Precautions / Restrictions Precautions Precautions: Fall Restrictions Weight Bearing Restrictions: No      Mobility  Bed Mobility Overal bed mobility: Needs Assistance Bed Mobility: Supine to Sit;Sit to Supine     Supine to sit: Mod assist Sit to supine: Mod assist   General bed mobility comments: for trunk facilitation to sit EOB; multimodal cues for safety, sequencing, and hand placement    Transfers Overall transfer level: Needs assistance Equipment used: Rolling walker (2 wheels) Transfers: Sit to/from Stand Sit to Stand: Min guard           General transfer comment: for safety, multimodal cues for hand/BLE placement, safety, and sequencing    Ambulation/Gait Ambulation/Gait assistance: Min guard Gait Distance (Feet): 10 Feet Assistive device: Rolling walker (2 wheels)         General Gait Details: CGA for safety to ambulate short distance at bedside with shoes on. Demonstrates kyphotic posture, slowed cadence, wide BOS, early reciprocal gait, decreased step length/foot clearance bil, fair balance, and decreased RW proximity. Verbal  cues for safety and sequencing.    Balance Overall balance assessment: Needs assistance Sitting-balance  support: Feet supported;Bilateral upper extremity supported Sitting balance-Leahy Scale: Fair     Standing balance support: During functional activity;Bilateral upper extremity supported Standing balance-Leahy Scale: Fair Standing balance comment: in RW                             Pertinent Vitals/Pain Pain Assessment: 0-10 Pain Score: 7  Pain Location: abdomen; gas pain Pain Intervention(s): Monitored during session;Repositioned    Home Living Family/patient expects to be discharged to:: Private residence Living Arrangements: Spouse/significant other Available Help at Discharge: Family;Available 24 hours/day Type of Home: House Home Access: Stairs to enter Entrance Stairs-Rails: None Entrance Stairs-Number of Steps: 2  -- currently working on installing ramp   Home Layout: One level Home Equipment: Conservation officer, nature (2 wheels);Rollator (4 wheels);Cane - single point;BSC/3in1;Grab bars - tub/shower Additional Comments: Pt states that BSC is too small to use. Has been alternating between Circles Of Care and RW, using RW more.    Prior Function Prior Level of Function : Independent/Modified Independent;History of Falls (last six months)             Mobility Comments: Pt/fiancee report pt uses RW vs. SPC at home for limited ambulation. 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 ADLs Comments: Pt/fiancee report pt was grossly mod I with ADL's, but required assist for washing back during sponge baths, and splitting IADL's.     Hand Dominance   Dominant Hand: Right    Extremity/Trunk Assessment   Upper Extremity Assessment Upper Extremity Assessment: Overall WFL for tasks assessed (LUE grossly 3+/5 due to pt c/o pain from chronic arthritis issues; RUE grossly 4+/5. Limited L shoulder flexion to ~110deg. Diminished sensation C5 (LUE), otherwise grossly intact bil.)    Lower Extremity Assessment Lower Extremity Assessment: Overall WFL for tasks assessed  (Grossly 4+/5; digit 1-3 amputation on R foot. Diminished sensation bil L4-5 due to chronic neuropathy)    Cervical / Trunk Assessment Cervical / Trunk Assessment: Kyphotic  Communication   Communication: HOH  Cognition Arousal/Alertness: Awake/alert Behavior During Therapy: WFL for tasks assessed/performed Overall Cognitive Status: Within Functional Limits for tasks assessed                                 General Comments: A&Ox4, able to follow 100% of simple 2-step commands        General Comments General comments (skin integrity, edema, etc.): nose bleed at end of session, multiple bruising/scabs on extremities, open sore to LUE that had seemed to clot    Exercises Other Exercises Other Exercises: Participated in bed mobility, transfers, and gait at bedside with RW. Increased assist for bed mobility. Increased cueing for safety/sequencing. Other Exercises: Pt and fiancee educated re: PT role/POC, DC recommendations, current functional level, safety concerns, safety with mobility. They verbalized understanding.   Assessment/Plan    PT Assessment Patient needs continued PT services  PT Problem List Decreased strength;Decreased mobility;Decreased safety awareness;Decreased balance;Impaired sensation;Decreased range of motion;Decreased activity tolerance;Decreased skin integrity       PT Treatment Interventions Gait training;Stair training;Functional mobility training;DME instruction;Therapeutic activities;Therapeutic exercise;Balance training;Neuromuscular re-education;Patient/family education    PT Goals (Current goals can be found in the Care Plan section)  Acute Rehab PT Goals Patient Stated Goal: to go home PT Goal Formulation: With patient/family Time  For Goal Achievement: 11/15/21 Potential to Achieve Goals: Good    Frequency Min 2X/week    AM-PAC PT "6 Clicks" Mobility  Outcome Measure Help needed turning from your back to your side while in a flat  bed without using bedrails?: A Little Help needed moving from lying on your back to sitting on the side of a flat bed without using bedrails?: A Lot Help needed moving to and from a bed to a chair (including a wheelchair)?: A Little Help needed standing up from a chair using your arms (e.g., wheelchair or bedside chair)?: A Little Help needed to walk in hospital room?: A Little Help needed climbing 3-5 steps with a railing? : A Lot 6 Click Score: 16    End of Session Equipment Utilized During Treatment: Gait belt Activity Tolerance: Patient tolerated treatment well Patient left: in bed;with family/visitor present (no alarm on ED bed; bil railing up; RN notified) Nurse Communication: Mobility status PT Visit Diagnosis: Unsteadiness on feet (R26.81);Other abnormalities of gait and mobility (R26.89);History of falling (Z91.81);Difficulty in walking, not elsewhere classified (R26.2);Muscle weakness (generalized) (M62.81)    Time: 6701-4103 PT Time Calculation (min) (ACUTE ONLY): 33 min   Charges:   PT Evaluation $PT Eval Low Complexity: 1 Low PT Treatments $Therapeutic Activity: 8-22 mins        Herminio Commons, PT, DPT 5:15 PM,11/01/21

## 2021-11-01 NOTE — Assessment & Plan Note (Signed)
Echo showing preserved LV systolic function.  No plan for ischemic work-up while in the hospital.  Cardiology following.  Holding Plavix due to dropping platelets.  Continue aspirin

## 2021-11-01 NOTE — Plan of Care (Signed)
Patient admitted from ED. Oriented to room and call bell. 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 Wife by bedside.

## 2021-11-01 NOTE — Assessment & Plan Note (Signed)
ID is concerned about possible pacemaker lead infection and depending on TEE results this may need to be removed after discussion with cardiology

## 2021-11-02 ENCOUNTER — Other Ambulatory Visit: Payer: Self-pay | Admitting: Cardiovascular Disease

## 2021-11-02 DIAGNOSIS — I25709 Atherosclerosis of coronary artery bypass graft(s), unspecified, with unspecified angina pectoris: Secondary | ICD-10-CM

## 2021-11-02 DIAGNOSIS — N1831 Chronic kidney disease, stage 3a: Secondary | ICD-10-CM

## 2021-11-02 DIAGNOSIS — Z950 Presence of cardiac pacemaker: Secondary | ICD-10-CM

## 2021-11-02 DIAGNOSIS — D696 Thrombocytopenia, unspecified: Secondary | ICD-10-CM

## 2021-11-02 DIAGNOSIS — D638 Anemia in other chronic diseases classified elsewhere: Secondary | ICD-10-CM

## 2021-11-02 LAB — COMPREHENSIVE METABOLIC PANEL
ALT: 75 U/L — ABNORMAL HIGH (ref 0–44)
AST: 191 U/L — ABNORMAL HIGH (ref 15–41)
Albumin: 2.7 g/dL — ABNORMAL LOW (ref 3.5–5.0)
Alkaline Phosphatase: 65 U/L (ref 38–126)
Anion gap: 5 (ref 5–15)
BUN: 47 mg/dL — ABNORMAL HIGH (ref 8–23)
CO2: 23 mmol/L (ref 22–32)
Calcium: 7.8 mg/dL — ABNORMAL LOW (ref 8.9–10.3)
Chloride: 104 mmol/L (ref 98–111)
Creatinine, Ser: 1.64 mg/dL — ABNORMAL HIGH (ref 0.61–1.24)
GFR, Estimated: 42 mL/min — ABNORMAL LOW (ref >60)
Glucose, Bld: 108 mg/dL — ABNORMAL HIGH (ref 70–99)
Potassium: 3.4 mmol/L — ABNORMAL LOW (ref 3.5–5.1)
Sodium: 132 mmol/L — ABNORMAL LOW (ref 135–145)
Total Bilirubin: 0.3 mg/dL (ref 0.3–1.2)
Total Protein: 5.7 g/dL — ABNORMAL LOW (ref 6.5–8.1)

## 2021-11-02 LAB — CBC
HCT: 27.2 % — ABNORMAL LOW (ref 39.0–52.0)
Hemoglobin: 9.1 g/dL — ABNORMAL LOW (ref 13.0–17.0)
MCH: 30.5 pg (ref 26.0–34.0)
MCHC: 33.5 g/dL (ref 30.0–36.0)
MCV: 91.3 fL (ref 80.0–100.0)
Platelets: 41 10*3/uL — ABNORMAL LOW (ref 150–400)
RBC: 2.98 MIL/uL — ABNORMAL LOW (ref 4.22–5.81)
RDW: 13.5 % (ref 11.5–15.5)
WBC: 3.7 10*3/uL — ABNORMAL LOW (ref 4.0–10.5)
nRBC: 0 % (ref 0.0–0.2)

## 2021-11-02 LAB — CULTURE, BLOOD (ROUTINE X 2)
Special Requests: ADEQUATE
Special Requests: ADEQUATE

## 2021-11-02 LAB — URINE CULTURE: Culture: <10000 — AB

## 2021-11-02 LAB — GLUCOSE, CAPILLARY
Glucose-Capillary: 92 mg/dL (ref 70–99)
Glucose-Capillary: 176 mg/dL — ABNORMAL HIGH (ref 70–99)
Glucose-Capillary: 188 mg/dL — ABNORMAL HIGH (ref 70–99)
Glucose-Capillary: 210 mg/dL — ABNORMAL HIGH (ref 70–99)

## 2021-11-02 NOTE — NC FL2 (Addendum)
[place] LEVEL OF CARE SCREENING TOOL     IDENTIFICATION  Patient Name: Nathan Singleton Birthdate: 12/16/1940 Sex: male Admission Date (Current Location): 10/31/2021  [facility] and [place] Number:  Engineering geologist and Address:  [facility], [address] [place], [place] [address]      Provider Number: [id number]  Attending Physician Name and Address:  Max Sane, MD  Relative Name and Phone Number:       Current Level of Care: Hospital Recommended Level of Care: [place] Prior Approval Number:    Date Approved/Denied:   PASRR Number: [id number] Discharge Plan: SNF    Current Diagnoses: Patient Active Problem List   Diagnosis Date Noted   Sepsis ([place]) 10/31/2021   Elevated troponin    NSTEMI (non-ST elevated myocardial infarction) ([place]) 10/17/2021   Pancytopenia (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   History of GI bleed 08/19/2021   MSSA bacteremia 08/19/2021   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[place]) 08/19/2021   Orthostatic hypotension 08/19/2021   Weakness 08/19/2021   HTN (hypertension) 06/27/2021   AKI (acute kidney injury) ([place]) 06/27/2021   AMS (altered mental status) 06/27/2021   CKD stage G3a/A3, GFR 45-59 and albumin creatinine ratio >300 mg/g (HCC) 06/27/2021   Peripheral vascular disease ([place]) 06/25/2021   Edema, unspecified 06/25/2021   Proteinuria, unspecified 06/25/2021    Type 2 diabetes mellitus with diabetic chronic kidney disease ([place]) 06/25/2021   Anemia in chronic kidney disease 06/25/2021   Bacteremia    Osteomyelitis ([place]) 06/01/2021   Syncope and collapse 05/25/2021   Spondylosis without myelopathy or radiculopathy, lumbosacral region 05/12/2021   CAP (community acquired pneumonia) 05/06/2021   Syncope, vasovagal 05/06/2021   CKD (chronic kidney disease), stage IIIa 05/06/2021   Diarrhea 05/06/2021   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[place]) 05/06/2021   Osteoarthritis of facet joint at L5-S1 level of lumbosacral spine 04/22/2021   Lumbar facet joint syndrome (Bilateral) (L>R) 04/22/2021   Anemia of chronic disease    Cellulitis of second toe of right foot 03/20/2021   Type 2 diabetes mellitus with hyperlipidemia ([place]) 03/20/2021   Chronic kidney disease, stage 3a ([place]) 03/20/2021   Dizziness 03/20/2021   Chronic pain syndrome 03/03/2021   Pharmacologic therapy 03/03/2021   Disorder of skeletal system 03/03/2021   Problems influencing health status 10/07/8526   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[place]) 03/03/2021   Chronic lower extremity pain (2ry area of Pain) (Bilateral) (R>L) 03/03/2021   Chronic anticoagulation (Plavix) 03/03/2021   Chronic shoulder pain (3ry area of Pain) (Left) 03/03/2021   History of total knee replacement (Bilateral) 03/03/2021   DDD (degenerative disc disease), thoracic 03/03/2021   Cervical facet hypertrophy (Multilevel) (Bilateral) 03/03/2021   DDD (degenerative disc disease), cervical 03/03/2021   BPH (benign prostatic hyperplasia) 02/14/2021   Diabetic foot ulcer ([place]) 02/13/2021   Hyperlipidemia associated with type 2 diabetes mellitus ([place]) 11/26/2020   Normocytic anemia 11/26/2020   Chronic kidney disease (CKD) 11/26/2020   Claudication ([place]) 11/26/2020   Contusion of right thigh 11/26/2020   COPD (  chronic obstructive pulmonary disease) ([place]) 11/26/2020   Dyspnea 11/26/2020   Erectile dysfunction 11/26/2020   GERD  without esophagitis 11/26/2020   Gout 11/26/2020   Hyperlipidemia, acquired 11/26/2020   Hyperlipidemia 11/26/2020   Myocardial infarction ([place]) 11/26/2020   Personal history of other diseases of urinary system 11/26/2020   Right leg swelling 11/26/2020   Sleep apnea 11/26/2020   Varicose veins 11/26/2020   Venous insufficiency 11/26/2020   Vitamin D deficiency 11/26/2020   Chest pain 11/21/2020   Thrombocytopenia ([place]) 11/21/2020   Presence of permanent cardiac pacemaker    Hypertension associated with diabetes [facility])    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 11/19/2020   Pacemaker St Jude device 11/19/2020   Peripheral vascular disease, unspecified ([place]) stents to popliteal arteries many years ago 11/19/2020   History of gingival bleeding 11/19/2020   Coronary artery disease involving coronary bypass graft of native heart with angina pectoris ([place]) 11/19/2020   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 11/19/2020   DM2 (diabetes mellitus, type 2) ([place])    Closed nondisplaced fracture of greater trochanter of right femur ([place]) 01/25/2020   DDD (degenerative disc disease), lumbar 12/10/2019   Chronic low back pain (1ry area of Pain) (Bilateral) (L>R) w/o sciatica 12/09/2019   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   Urinary retention 12/09/2019   Obesity 04/07/2019   Retinopathy 04/07/2019   Deafness, left 01/12/2019   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/12/2019   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   Bilateral tinnitus 08/24/2018   Disequilibrium 08/24/2018   Hypertrophy of nasal turbinates 08/24/2018   Hypertrophy of tonsils 08/24/2018   Obstructive sleep apnea of adult 05/30/2018   Presence of prosthetic heart valve 05/30/2018   Heart block 04/25/2018   Calculus of gallbladder with acute cholecystitis without obstruction 09/21/2017   Right bundle branch block 12/25/2016   Gastrojejunal ulcer 03/23/2014   Jejunal  ulcer 03/23/2014   Insomnia 03/22/2014   Blisters of multiple sites 01/18/2014   Cellulitis 01/18/2014   OA (osteoarthritis) of knee 06/09/2013   Status post percutaneous transluminal coronary angioplasty 01/14/2011   Coronary arteriosclerosis 01/29/2009   Bipolar disorder ([place]) 01/29/2009   Depression 01/29/2009   Hearing loss 01/29/2009    Orientation RESPIRATION BLADDER Height & Weight     Self, Time, Situation, Place  Normal Continent Weight: 219 lb 5.7 oz (99.5 kg) Height:  6\' 4"  (193 cm)  BEHAVIORAL SYMPTOMS/MOOD NEUROLOGICAL BOWEL NUTRITION STATUS   (None)  (None) Continent Diet (Heart healthy/carb modified)  AMBULATORY STATUS COMMUNICATION OF NEEDS Skin   Limited Assist Verbally Skin abrasions, Bruising, Other (Comment) (Toe amputation, excoriated location.)                       Personal Care Assistance Level of Assistance  Bathing, Feeding, Dressing Bathing Assistance: Limited assistance Feeding assistance: Limited assistance Dressing Assistance: Limited assistance     Functional Limitations Info  Sight, Hearing, Speech Sight Info: Adequate Hearing Info: Impaired (Has left cochlear implant.) Speech Info: Adequate    SPECIAL CARE FACTORS FREQUENCY  PT (By licensed PT), OT (By licensed OT)     PT Frequency: 5 x week OT Frequency: 5 x week            Contractures Contractures Info: Not present    Additional Factors Info  Code Status, Allergies, Isolation Precautions Code Status Info: Full code Allergies Info: Atorvastatin, Celecoxib, Dicylomine Hcl, Doxycycline, Metformin     Isolation Precautions Info:  Contact: MSSA Bacteremia     Current Medications (11/02/2021):  This is the current hospital active medication list Current Facility-Administered Medications  Medication Dose Route Frequency Provider Last Rate Last Admin   acetaminophen (TYLENOL) tablet 650 mg  650 mg Oral Q6H PRN Wynetta Fines T, MD   650 mg at 11/02/21 0001   albuterol  (PROVENTIL) (2.5 MG/3ML) 0.083% nebulizer solution 2.5 mg  2.5 mg Nebulization Q4H PRN Wynetta Fines T, MD       allopurinol (ZYLOPRIM) tablet 100 mg  100 mg Oral Daily Wynetta Fines T, MD   100 mg at 11/02/21 7517   aspirin EC tablet 81 mg  81 mg Oral Daily Wynetta Fines T, MD   81 mg at 11/02/21 0906   carvedilol (COREG) tablet 6.25 mg  6.25 mg Oral BID WC Wynetta Fines T, MD   6.25 mg at 11/02/21 0906   ceFAZolin (ANCEF) IVPB 2g/100 mL premix  2 g Intravenous Q8H Rauer, Samantha O, RPH 200 mL/hr at 11/02/21 0627 2 g at 11/02/21 0627   fluticasone furoate-vilanterol (BREO ELLIPTA) 100-25 MCG/ACT 1 puff  1 puff Inhalation QHS Renda Rolls, RPH   1 puff at 11/01/21 2343   gabapentin (NEURONTIN) capsule 100 mg  100 mg Oral Daily Wynetta Fines T, MD   100 mg at 11/02/21 0905   insulin aspart (novoLOG) injection 0-9 Units  0-9 Units Subcutaneous TID WC Wynetta Fines T, MD   2 Units at 11/02/21 1246   insulin glargine-yfgn (SEMGLEE) injection 18 Units  18 Units Subcutaneous QHS Wynetta Fines T, MD   18 Units at 11/01/21 2356   lactulose (CHRONULAC) 10 GM/15ML solution 10 g  10 g Oral BID Wynetta Fines T, MD   10 g at 11/02/21 0905   loperamide (IMODIUM) capsule 2 mg  2 mg Oral PRN Wynetta Fines T, MD       ondansetron [facility]) injection 4 mg  4 mg Intravenous Q6H PRN Wynetta Fines T, MD   4 mg at 10/31/21 1313   pantoprazole (PROTONIX) EC tablet 40 mg  40 mg Oral Daily Wynetta Fines T, MD   40 mg at 11/02/21 0906   polyethylene glycol (MIRALAX / GLYCOLAX) packet 17 g  17 g Oral Daily Wynetta Fines T, MD   17 g at 11/02/21 0906   sertraline (ZOLOFT) tablet 200 mg  200 mg Oral Daily Wynetta Fines T, MD   200 mg at 11/02/21 0017   sucralfate (CARAFATE) tablet 1 g  1 g Oral QID Wynetta Fines T, MD   1 g at 11/02/21 0906   tamsulosin (FLOMAX) capsule 0.4 mg  0.4 mg Oral Daily Wynetta Fines T, MD   0.4 mg at 11/02/21 4944     Discharge Medications: Please see discharge summary for a list of discharge medications.  Relevant  Imaging Results:  Relevant Lab Results:   Additional Information SS#: [id number]. As of August, has had two Moderna vaccines and one booster. Unsure if he has had more since this time.  Candie Chroman, LCSW

## 2021-11-02 NOTE — Assessment & Plan Note (Signed)
Continue cefazolin.  TEE scheduled for Monday 11/21 after which decision to remove pacer will be made as ID is concerned about pacemaker wire to be the source of infection.  He will need long-term IV antibiotics.  He also has cochlear implant

## 2021-11-02 NOTE — Progress Notes (Signed)
Progress Note    Nathan Singleton   ELF:[id number]  DOB: 1941-10-06  DOA: 10/31/2021     2 Date of Service: 11/02/2021   Clinical Course  80 y.o. 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  11/19: TEE planned for 11/21, on IV antibiotics.  Platelets trending down.  Holding Plavix and subcu heparin.  Continue aspirin and start SCDs for DVT prophylaxis 11/20: TEE tomorrow.  PT recommends SNF.  TOC aware   Assessment and Plan * Sepsis ([place]) Continue IV antibiotic as per ID.  May need pacer removed depending on TEE scheduled for Monday 11/21  Anemia of chronic disease Hemoglobin 9.1.  Monitor closely.  No obvious bleeding  MSSA bacteremia Continue cefazolin.  TEE scheduled for Monday 11/21 after which decision to remove pacer will be made as ID is concerned about pacemaker wire to be the source of infection.  He will need long-term IV antibiotics.  He also has cochlear implant  Chronic kidney disease, stage 3a ([place]) Close to baseline.  Creatinine of 1.7  Thrombocytopenia (HCC) Dropping platelets so holding Plavix and subcu heparin.  Continue aspirin.  Platelets improved some 39->41  Coronary artery disease involving coronary bypass graft of native heart with angina pectoris ([place]) Echo showing preserved LV systolic function.  No plan for ischemic work-up while in the hospital.  Cardiology following.  Holding Plavix due to dropping platelets.  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  Echo showing normal  structure and function of aortic valve prosthesis     Subjective:  Very hard of hearing.  No new complaints.  Looking forward to get TEE done tomorrow  Objective Vitals:   11/01/21 2013 11/02/21 0021 11/02/21 0538 11/02/21 0723  BP: 134/66 138/63 (!) 141/67 (!) 141/79  Pulse: 80 77 71 78  Resp: 19 (!) 21 18 20   Temp: 99.5 F (37.5 C) 99.3 F (37.4 C) 98.1 F (36.7 C) 98.7 F (37.1 C)  TempSrc:   Oral   SpO2: 98% 99% 98% 97%  Weight:      Height:       99.5 kg  Vital signs were reviewed and unremarkable.   Exam Physical Exam   Eyes:PERRL, lids and conjunctivae normal ENMT:Mucous membranes are moist. Posterior pharynx clear of any exudate or lesions.Normal dentition.  Neck:normal, supple, no masses, no thyromegaly Respiratory:clear to auscultation bilaterally, no wheezing, no crackles. Normal respiratory effort. No accessory muscle use.  Cardiovascular:Regular rate and rhythm, no murmurs / rubs / gallops. No extremity edema. Abdomen:no tenderness, no masses palpated. No hepatosplenomegaly. Bowel sounds positive.  Musculoskeletal:no clubbing / cyanosis. No joint deformity upper and lower extremities. no contractures. Normal muscle tone.  Skin:left arm skin tear andmultiple bruises Neurologic: Alert and awake, nonfocal Psychiatric: Normal mood and affect  Labs / Other Information My review of labs, imaging, notes and other tests is significant for Low platelets but improving     Disposition Plan: Status is: Inpatient  Remains inpatient appropriate because: TEE tomorrow after which decision to remove pacemaker based on discussion with cardiology and ID  Time spent: 35 minutes [facility] 11/02/2021, 12:06 PM

## 2021-11-02 NOTE — TOC CM/SW Note (Addendum)
RE: Nathan Singleton Date of Birth: 1941-12-14 Date: 11/02/2021   To Whom It May Concern:  Please be advised that the above-named patient will require a short-term nursing home stay - anticipated 30 days or less for rehabilitation and strengthening.  The plan is for return home.

## 2021-11-02 NOTE — Plan of Care (Signed)
  Problem: Education: Goal: Knowledge of General Education information will improve Description Including pain rating scale, medication(s)/side effects and non-pharmacologic comfort measures Outcome: Progressing   Problem: Health Behavior/Discharge Planning: Goal: Ability to manage health-related needs will improve Outcome: Progressing   

## 2021-11-02 NOTE — Assessment & Plan Note (Signed)
Close to baseline.  Creatinine of 1.7

## 2021-11-02 NOTE — Assessment & Plan Note (Signed)
Hemoglobin 9.1.  Monitor closely.  No obvious bleeding

## 2021-11-02 NOTE — Assessment & Plan Note (Signed)
Dropping platelets so holding Plavix and subcu heparin.  Continue aspirin.  Platelets improved some 39->41

## 2021-11-02 NOTE — Assessment & Plan Note (Signed)
TEE planned for Monday 11/21.  Echo showing normal structure and function of aortic valve prosthesis

## 2021-11-02 NOTE — TOC Initial Note (Addendum)
Transition of Care [facility]) - Initial/Assessment Note    Patient Details  Name: Nathan Singleton MRN: [id number] Date of Birth: 1941/05/01  Transition of Care [facility]) CM/SW Contact:    Nathan Chroman, LCSW Phone Number: 11/02/2021, 1:21 PM  Clinical Narrative:  CSW met with patient. Significant other at bedside. CSW introduced role and explained that PT recommendations would be discussed. Patient and significant other are agreeable to SNF. He went to [facility] over the summer. Gave CMS scores for SNF's within 25 miles of his zip code. PASARR under manual review. No further concerns. CSW encouraged patient and his significant other to contact CSW as needed. CSW will continue to follow patient and his significant other for support and facilitate discharge to SNF once medically stable.                3:57 pm: Uploaded requested documents into [place] [employer] for PASARR review.  Expected Discharge Plan: Skilled Nursing Facility Barriers to Discharge: Continued Medical Work up, [place] (PASRR)   Patient Goals and CMS Choice   CMS Medicare.gov Compare Post Acute Care list provided to:: Patient (Significant other at bedside.)    Expected Discharge Plan and Services Expected Discharge Plan: [place] Choice: [place] arrangements for the past 2 months: Single Family Home                                      Prior Living Arrangements/Services Living arrangements for the past 2 months: Single Family Home Lives with:: Significant Other Patient language and need for interpreter reviewed:: Yes Do you feel safe going back to the place where you live?: Yes      Need for Family Participation in Patient Care: Yes (Comment) Care giver support system in place?: Yes (comment)   Criminal Activity/Legal Involvement Pertinent to Current Situation/Hospitalization: No - Comment as needed  Activities of Daily Living Home  Assistive Devices/Equipment: Cane (specify quad or straight), Wheelchair ADL Screening (condition at time of admission) Patient's cognitive ability adequate to safely complete daily activities?: Yes Is the patient deaf or have difficulty hearing?: Yes Does the patient have difficulty seeing, even when wearing glasses/contacts?: Yes Does the patient have difficulty concentrating, remembering, or making decisions?: No Patient able to express need for assistance with ADLs?: Yes Does the patient have difficulty dressing or bathing?: No Independently performs ADLs?: Yes (appropriate for developmental age) Does the patient have difficulty walking or climbing stairs?: No Weakness of Legs: None Weakness of Arms/Hands: None  Permission Sought/Granted Permission sought to share information with : Facility Sport and exercise psychologist, Family Supports    Share Information with NAME: [name]  Permission granted to share info w AGENCY: SNF's  Permission granted to share info w Relationship: Significant other  Permission granted to share info w Contact Information: [phone]  Emotional Assessment Appearance:: Appears stated age Attitude/Demeanor/Rapport: Engaged, Gracious Affect (typically observed): Accepting, Appropriate, Calm, Pleasant Orientation: : Oriented to Self, Oriented to Place, Oriented to  Time, Oriented to Situation Alcohol / Substance Use: Not Applicable Psych Involvement: No (comment)  Admission diagnosis:  Bacteremia [R78.81] AKI (acute kidney injury) ([place]) [N17.9] Sepsis ([place]) [A41.9] Sepsis, due to unspecified organism, unspecified whether acute organ dysfunction present [facility]) [A41.9] Patient Active Problem List   Diagnosis Date Noted   Sepsis ([place]) 10/31/2021   Elevated troponin    NSTEMI (non-ST elevated  myocardial infarction) ([place]) 10/17/2021   Pancytopenia ([place]) 10/17/2021   CAD (coronary artery disease) 10/17/2021   Acute renal failure superimposed on stage 3a  chronic kidney disease ([place]) 10/17/2021   Osteoarthritis of shoulder (Left) 09/15/2021   Osteoarthritis of AC (acromioclavicular) joint (Left) 09/15/2021   Osteoarthritis of glenohumeral joint (Left) 09/15/2021   Limited internal rotation of glenohumeral joint of shoulder (Left) 09/15/2021   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 ([place]) 16/96/7893   Arthritis 08/19/2021   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   History of GI bleed 08/19/2021   MSSA bacteremia 08/19/2021   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[place]) 08/19/2021   Orthostatic hypotension 08/19/2021   Weakness 08/19/2021   HTN (hypertension) 06/27/2021   AKI (acute kidney injury) ([place]) 06/27/2021   AMS (altered mental status) 06/27/2021   CKD stage G3a/A3, GFR 45-59 and albumin creatinine ratio >300 mg/g (HCC) 06/27/2021   Peripheral vascular disease ([place]) 06/25/2021   Edema, unspecified 06/25/2021   Proteinuria, unspecified 06/25/2021   Type 2 diabetes mellitus with diabetic chronic kidney disease ([place]) 06/25/2021   Anemia in chronic kidney disease 06/25/2021   Bacteremia    Osteomyelitis ([place]) 06/01/2021   Syncope and collapse 05/25/2021   Spondylosis without myelopathy or radiculopathy, lumbosacral region 05/12/2021   CAP (community acquired pneumonia) 05/06/2021   Syncope, vasovagal 05/06/2021   CKD (chronic kidney disease), stage IIIa 05/06/2021   Diarrhea 05/06/2021   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[place]) 05/06/2021   Osteoarthritis of facet joint at L5-S1 level of lumbosacral spine 04/22/2021   Lumbar facet joint syndrome (Bilateral) (L>R) 04/22/2021   Anemia of chronic disease    Cellulitis of second toe of right foot 03/20/2021   Type 2 diabetes mellitus with hyperlipidemia ([place]) 03/20/2021   Chronic kidney disease, stage 3a ([place]) 03/20/2021   Dizziness 03/20/2021   Chronic pain syndrome 03/03/2021   Pharmacologic therapy 03/03/2021   Disorder of skeletal system 03/03/2021    Problems influencing health status 81/12/7508   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[place]) 03/03/2021   Chronic lower extremity pain (2ry area of Pain) (Bilateral) (R>L) 03/03/2021   Chronic anticoagulation (Plavix) 03/03/2021   Chronic shoulder pain (3ry area of Pain) (Left) 03/03/2021   History of total knee replacement (Bilateral) 03/03/2021   DDD (degenerative disc disease), thoracic 03/03/2021   Cervical facet hypertrophy (Multilevel) (Bilateral) 03/03/2021   DDD (degenerative disc disease), cervical 03/03/2021   BPH (benign prostatic hyperplasia) 02/14/2021   Diabetic foot ulcer ([place]) 02/13/2021   Hyperlipidemia associated with type 2 diabetes mellitus ([place]) 11/26/2020   Normocytic anemia 11/26/2020   Chronic kidney disease (CKD) 11/26/2020   Claudication ([place]) 11/26/2020   Contusion of right thigh 11/26/2020   COPD (chronic obstructive pulmonary disease) ([place]) 11/26/2020   Dyspnea 11/26/2020   Erectile dysfunction 11/26/2020   GERD without esophagitis 11/26/2020   Gout 11/26/2020   Hyperlipidemia, acquired 11/26/2020   Hyperlipidemia 11/26/2020   Myocardial infarction ([place]) 11/26/2020   Personal history of other diseases of urinary system 11/26/2020   Right leg swelling 11/26/2020   Sleep apnea 11/26/2020   Varicose veins 11/26/2020   Venous insufficiency 11/26/2020   Vitamin D deficiency 11/26/2020   Chest pain 11/21/2020   Thrombocytopenia ([place]) 11/21/2020   Presence of permanent cardiac pacemaker    Hypertension associated with diabetes [facility])    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 11/19/2020   Pacemaker St Jude device 11/19/2020   Peripheral vascular disease, unspecified ([place]) stents to popliteal arteries many years ago  11/19/2020   History of gingival bleeding 11/19/2020   Coronary artery disease involving coronary bypass graft of native heart with angina pectoris ([place]) 11/19/2020   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 11/19/2020   DM2 (diabetes mellitus, type 2) ([place])    Closed nondisplaced fracture of greater trochanter of right femur ([place]) 01/25/2020   DDD (degenerative disc disease), lumbar 12/10/2019   Chronic low back pain (1ry area of Pain) (Bilateral) (L>R) w/o sciatica 12/09/2019   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   Urinary retention 12/09/2019   Obesity 04/07/2019   Retinopathy 04/07/2019   Deafness, left 01/12/2019   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/12/2019   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   Bilateral tinnitus 08/24/2018   Disequilibrium 08/24/2018   Hypertrophy of nasal turbinates 08/24/2018   Hypertrophy of tonsils 08/24/2018   Obstructive sleep apnea of adult 05/30/2018   Presence of prosthetic heart valve 05/30/2018   Heart block 04/25/2018   Calculus of gallbladder with acute cholecystitis without obstruction 09/21/2017   Right bundle branch block 12/25/2016   Gastrojejunal ulcer 03/23/2014   Jejunal ulcer 03/23/2014   Insomnia 03/22/2014   Blisters of multiple sites 01/18/2014   Cellulitis 01/18/2014   OA (osteoarthritis) of knee 06/09/2013   Status post percutaneous transluminal coronary angioplasty 01/14/2011   Coronary arteriosclerosis 01/29/2009   Bipolar disorder ([place]) 01/29/2009   Depression 01/29/2009   Hearing loss 01/29/2009   PCP:  Ranae Plumber, PA Pharmacy:   [place], [address] [place] [address] Phone: [phone] Fax: [phone]     Social Determinants of Health (SDOH) Interventions    Readmission Risk Interventions Readmission Risk Prevention Plan 05/07/2021 03/23/2021 02/14/2021  Transportation Screening Complete Complete Complete  Medication Review (RN Care Manager) Complete Complete Complete  PCP or Specialist appointment within 3-5 days of discharge Complete Complete Complete  HRI or Home Care Consult Complete Complete Complete  SW Recovery Care/Counseling  Consult Complete Complete Complete  Palliative Care Screening Not Applicable Not Applicable Complete  Skilled Nursing Facility Not Applicable Not Applicable Complete

## 2021-11-02 NOTE — Assessment & Plan Note (Signed)
Echo showing preserved LV systolic function.  No plan for ischemic work-up while in the hospital.  Cardiology following.  Holding Plavix due to dropping platelets.  Continue aspirin

## 2021-11-02 NOTE — Progress Notes (Addendum)
Progress Note  Patient Name: Nathan Singleton Date of Encounter: 11/02/2021  [facility] Cardiologist:   Subjective   No complaints, still feels weak Remains in bed supine No family at the bedside  Inpatient Medications    Scheduled Meds:  allopurinol  100 mg Oral Daily   aspirin EC  81 mg Oral Daily   carvedilol  6.25 mg Oral BID WC   fluticasone furoate-vilanterol  1 puff Inhalation QHS   gabapentin  100 mg Oral Daily   insulin aspart  0-9 Units Subcutaneous TID WC   insulin glargine-yfgn  18 Units Subcutaneous QHS   lactulose  10 g Oral BID   pantoprazole  40 mg Oral Daily   polyethylene glycol  17 g Oral Daily   sertraline  200 mg Oral Daily   sucralfate  1 g Oral QID   tamsulosin  0.4 mg Oral Daily   Continuous Infusions:   ceFAZolin (ANCEF) IV 2 g (11/02/21 0627)   PRN Meds: acetaminophen, albuterol, loperamide, ondansetron (ZOFRAN) IV   Vital Signs    Vitals:   11/02/21 0021 11/02/21 0538 11/02/21 0723 11/02/21 1234  BP: 138/63 (!) 141/67 (!) 141/79 140/69  Pulse: 77 71 78 71  Resp: (!) 21 18 20 20   Temp: 99.3 F (37.4 C) 98.1 F (36.7 C) 98.7 F (37.1 C) 98.7 F (37.1 C)  TempSrc:  Oral    SpO2: 99% 98% 97% 100%  Weight:      Height:        Intake/Output Summary (Last 24 hours) at 11/02/2021 1500 Last data filed at 11/02/2021 0300 Gross per 24 hour  Intake 1124.33 ml  Output 1350 ml  Net -225.67 ml   Last 3 Weights 10/31/2021 10/30/2021 10/18/2021  Weight (lbs) 219 lb 5.7 oz 219 lb 5.7 oz 219 lb 5.7 oz  Weight (kg) 99.5 kg 99.5 kg 99.5 kg      Telemetry     Paced rhythm- Personally Reviewed  ECG    - Personally Reviewed  Physical Exam   GEN: No acute distress.   Neck: No JVD Cardiac: RRR, no murmurs, rubs, or gallops.  Respiratory: Clear to auscultation bilaterally. GI: Soft, nontender, non-distended  MS: No edema; No deformity. Neuro:  Nonfocal  Psych: Normal affect   Labs    High Sensitivity Troponin:   Recent Labs   Lab 10/17/21 0726 10/17/21 1112 10/17/21 1305 10/31/21 1210 10/31/21 1502  TROPONINIHS 94* 89* 81* 277* 286*     Chemistry Recent Labs  Lab 10/31/21 1013 11/01/21 0736 11/02/21 0553  NA 136 131* 132*  K 3.5 3.5 3.4*  CL 106 102 104  CO2 21* 22 23  GLUCOSE 120* 182* 108*  BUN 40* 50* 47*  CREATININE 1.88* 1.77* 1.64*  CALCIUM 7.3* 7.4* 7.8*  PROT 5.4* 5.4* 5.7*  ALBUMIN 2.9* 2.5* 2.7*  AST 352* 219* 191*  ALT 231* 141* 75*  ALKPHOS 58 60 65  BILITOT 0.6 0.5 0.3  GFRNONAA 36* 39* 42*  ANIONGAP 9 7 5     Lipids No results for input(s): CHOL, TRIG, HDL, LABVLDL, LDLCALC, CHOLHDL in the last 168 hours.  Hematology Recent Labs  Lab 10/31/21 1013 11/01/21 0736 11/02/21 0553  WBC 6.7 4.5 3.7*  RBC 3.06* 2.83* 2.98*  HGB 9.7* 8.9* 9.1*  HCT 28.3* 25.6* 27.2*  MCV 92.5 90.5 91.3  MCH 31.7 31.4 30.5  MCHC 34.3 34.8 33.5  RDW 13.4 13.4 13.5  PLT 70* 37* 41*   Thyroid No results for input(s): TSH,  FREET4 in the last 168 hours.  BNPNo results for input(s): BNP, PROBNP in the last 168 hours.  DDimer No results for input(s): DDIMER in the last 168 hours.   Radiology    No results found.  Cardiac Studies     Patient Profile     80 y.o. 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  Recurrent MSSA bacteremia:  third hospital admission for MSSA bacteremia -Plan for TEE on 11/03/2021,  Risk and benefit of the procedure  discussed -Will need EP consultation to consider whether pacer extraction is indicated   2.  CAD involving the native coronary arteries with stable angina elevated troponin demand ischemia in the setting of bacteremia with elevated high-sensitivity troponin: Denies anginal symptoms  -Echo demonstrated preserved LV systolic function No further work-up at this time   3.  Aortic stenosis status post TAVR: -TEE as above tomorrow, rule out endocarditis   4.  Sick sinus syndrome status post dual-chamber PPM: -Device appears to be functioning normally -He has now been admitted 3 times in 2022 with MSSA bacteremia -We will need EP consult when available  5.  Thrombocytopenia Off heparin subcu, continue aspirin, Plavix held Teds and SCIDS ordered       Total encounter time more than 25 minutes  Greater than 50% was spent in counseling and coordination of care with the patient   For questions or updates, please contact [place] Please consult [email or web address] for contact info under        Signed, Ida Rogue, MD  11/02/2021, 3:00 PM

## 2021-11-02 NOTE — Assessment & Plan Note (Signed)
Continue IV antibiotic as per ID.  May need pacer removed depending on TEE scheduled for Monday 11/21

## 2021-11-02 NOTE — Assessment & Plan Note (Signed)
ID is concerned about possible pacemaker lead infection and depending on TEE results this may need to be removed after discussion with cardiology

## 2021-11-03 ENCOUNTER — Encounter
Admission: EM | Disposition: A | Discharge status: Other Acute Inpatient Hospital | Payer: Self-pay | Source: Home / Self Care | Attending: Internal Medicine

## 2021-11-03 ENCOUNTER — Inpatient Hospital Stay (HOSPITAL_COMMUNITY)
Admit: 2021-11-03 | Discharge: 2021-11-03 | Disposition: A | Discharge status: Home / Self Care | Payer: 59 | Attending: Physician Assistant | Admitting: Physician Assistant

## 2021-11-03 ENCOUNTER — Encounter: Payer: Self-pay | Admitting: Anesthesiology

## 2021-11-03 DIAGNOSIS — A419 Sepsis, unspecified organism: Secondary | ICD-10-CM

## 2021-11-03 DIAGNOSIS — R7881 Bacteremia: Secondary | ICD-10-CM

## 2021-11-03 DIAGNOSIS — I340 Nonrheumatic mitral (valve) insufficiency: Secondary | ICD-10-CM

## 2021-11-03 DIAGNOSIS — I361 Nonrheumatic tricuspid (valve) insufficiency: Secondary | ICD-10-CM

## 2021-11-03 HISTORY — PX: TEE WITHOUT CARDIOVERSION: SHX5443

## 2021-11-03 LAB — GLUCOSE, CAPILLARY
Glucose-Capillary: 259 mg/dL — ABNORMAL HIGH (ref 70–99)
Glucose-Capillary: 182 mg/dL — ABNORMAL HIGH (ref 70–99)
Glucose-Capillary: 183 mg/dL — ABNORMAL HIGH (ref 70–99)
Glucose-Capillary: 162 mg/dL — ABNORMAL HIGH (ref 70–99)
Glucose-Capillary: 166 mg/dL — ABNORMAL HIGH (ref 70–99)
Glucose-Capillary: 296 mg/dL — ABNORMAL HIGH (ref 70–99)

## 2021-11-03 LAB — CBC
HCT: 25.2 % — ABNORMAL LOW (ref 39.0–52.0)
Hemoglobin: 8.6 g/dL — ABNORMAL LOW (ref 13.0–17.0)
MCH: 30.5 pg (ref 26.0–34.0)
MCHC: 34.1 g/dL (ref 30.0–36.0)
MCV: 89.4 fL (ref 80.0–100.0)
Platelets: 43 10*3/uL — ABNORMAL LOW (ref 150–400)
RBC: 2.82 MIL/uL — ABNORMAL LOW (ref 4.22–5.81)
RDW: 13.5 % (ref 11.5–15.5)
WBC: 4.2 10*3/uL (ref 4.0–10.5)
nRBC: 0 % (ref 0.0–0.2)

## 2021-11-03 LAB — BASIC METABOLIC PANEL
Anion gap: 9 (ref 5–15)
BUN: 44 mg/dL — ABNORMAL HIGH (ref 8–23)
CO2: 22 mmol/L (ref 22–32)
Calcium: 7.8 mg/dL — ABNORMAL LOW (ref 8.9–10.3)
Chloride: 104 mmol/L (ref 98–111)
Creatinine, Ser: 1.59 mg/dL — ABNORMAL HIGH (ref 0.61–1.24)
GFR, Estimated: 44 mL/min — ABNORMAL LOW (ref >60)
Glucose, Bld: 174 mg/dL — ABNORMAL HIGH (ref 70–99)
Potassium: 3.3 mmol/L — ABNORMAL LOW (ref 3.5–5.1)
Sodium: 135 mmol/L (ref 135–145)

## 2021-11-03 SURGERY — ECHOCARDIOGRAM, TRANSESOPHAGEAL
Anesthesia: Monitor Anesthesia Care

## 2021-11-03 MED ORDER — FENTANYL CITRATE (PF) 100 MCG/2ML IJ SOLN
INTRAMUSCULAR | Status: AC | PRN
  Administered 2021-11-03: 25 ug via INTRAVENOUS

## 2021-11-03 MED ORDER — MIDAZOLAM HCL 2 MG/2ML IJ SOLN
INTRAMUSCULAR | Status: AC
  Filled 2021-11-03: qty 4

## 2021-11-03 MED ORDER — SODIUM CHLORIDE 0.9 % IV SOLN: INTRAVENOUS | Status: DC

## 2021-11-03 MED ORDER — LIDOCAINE VISCOUS HCL 2 % MT SOLN
OROMUCOSAL | Status: AC
  Filled 2021-11-03: qty 15

## 2021-11-03 MED ORDER — BUTAMBEN-TETRACAINE-BENZOCAINE 2-2-14 % EX AERO
INHALATION_SPRAY | CUTANEOUS | Status: AC
  Filled 2021-11-03: qty 5

## 2021-11-03 MED ORDER — FENTANYL CITRATE (PF) 100 MCG/2ML IJ SOLN
INTRAMUSCULAR | Status: AC
  Filled 2021-11-03: qty 2

## 2021-11-03 MED ORDER — MIDAZOLAM HCL 2 MG/2ML IJ SOLN
INTRAMUSCULAR | Status: AC | PRN
  Administered 2021-11-03: 1 mg via INTRAVENOUS

## 2021-11-03 NOTE — Procedures (Signed)
Transesophageal Echocardiogram :  Indication: bacteremia  Procedure: 76ml of viscous lidocaine were given orally to provide local anesthesia to the oropharynx. The patient was positioned supine on the left side, bite block provided. The patient was moderately sedated with the doses of versed and fentanyl as detailed below.  Using digital technique an omniplane probe was advanced into the esophagus without incident.   Moderate sedation: 1. Sedation used:  Versed: 1mg , Fentanyl: 69mcg 2. Time administered:   12:43  Time when patient started recovery: 13:03 3. I was face to face during this time 30mins  See report in EPIC  for complete details: In brief, imaging revealed normal LV function with no RWMAs and no mural apical thrombus.  .  Estimated ejection fraction was 55%.  Right sided cardiac chambers were normal with no evidence of pulmonary hypertension.  No evidence for vegetations  Imaging of the septum showed no ASD or VSD 2D and color flow confirmed no PFO  The LA was well visualized in orthogonal views.  There was no spontaneous contrast and no thrombus in the LA and LA appendage   Conclusion: No evidence for endocarditis on this TEE.   Aaron Edelman Agbor-Etang 11/03/2021 2:42 PM

## 2021-11-03 NOTE — Assessment & Plan Note (Signed)
Echo showing preserved LV systolic function.  No plan for ischemic work-up while in the hospital.  Cardiology following.  Holding Plavix due to dropping platelets.  Continue aspirin

## 2021-11-03 NOTE — Progress Notes (Signed)
Pharmacy Antibiotic Note  Nathan Singleton is a 80 y.o. male with past medical history of hypertension, diabetes, CAD, TAVR, pacemaker, CKD, and peripheral vascular disease who presents to the ED for abnormal labs. Pt was seen in ED 11/17 d/t fever and 1 week of worsening arm pain w/ skin tears on forearms. Pt was sent home on Keflex. Since then, pt blood cultures from 11/17 came back with 4/4 bottles positive for MSSA and pt was called back into ED for reevaluation. Pharmacy has been consulted for cefazolin dosing bacteremia.  TEE- no left atrial/left atrial appendage thrombus detected. On day 4 of antibiotics. Afebrile, WBC WNL. Scr 1.59 c/w BL.  Plan: Continue cefazolin 2 grams IV q8h Monitor clinical course, renal function and adjust dose as clinically indicated Request repeat blood cultures  Height: 6\' 4"  (193 cm) Weight: 99.5 kg (219 lb 5.7 oz) IBW/kg (Calculated) : 86.8  Temp (24hrs), Avg:98.4 F (36.9 C), Min:97.3 F (36.3 C), Max:98.7 F (37.1 C)  Recent Labs  Lab 10/30/21 1001 10/30/21 1003 10/31/21 1011 10/31/21 1013 11/01/21 0736 11/02/21 0553 11/03/21 0536  WBC  --  5.4  --  6.7 4.5 3.7* 4.2  CREATININE  --  1.42*  --  1.88* 1.77* 1.64* 1.59*  LATICACIDVEN 1.1  --  1.3  1.3  --   --   --   --      Estimated Creatinine Clearance: 46.3 mL/min (A) (by C-G formula based on SCr of 1.59 mg/dL (H)).    Allergies  Allergen Reactions   Atorvastatin Other (See Comments)    "bad for kidneys" Cramping    Celecoxib Other (See Comments)    Other reaction(s): Other (See Comments) Kidney Problem Kidney Problem    Dicyclomine Hcl Other (See Comments)    Stomach cramps   Doxycycline Other (See Comments)    Per wife (per RN)- pt gets severe stomach pains even when takes with food and PCP said to not take     Metformin Other (See Comments)    Antimicrobials this admission: 11/18 cefazolin >>    Microbiology results: 11/17 Bcx: MSSA 4/4 11/18 Bcx: MSSA  4/4  Thank you for allowing pharmacy to be a part of this patient's care.  Wynelle Cleveland, PharmD Pharmacy Resident  11/03/2021 4:07 PM

## 2021-11-03 NOTE — Progress Notes (Signed)
*  PRELIMINARY RESULTS* Echocardiogram Echocardiogram Transesophageal has been performed.  Nathan Singleton 11/03/2021, 1:17 PM

## 2021-11-03 NOTE — Progress Notes (Signed)
PT Cancellation Note  Patient Details Name: Claiborne Stroble MRN: [id number] DOB: [birth year]   Cancelled Treatment:    Reason Eval/Treat Not Completed: Patient at procedure or test/unavailable, will attempt to see pt at a future date/time as medically appropriate.    Heloise Beecham Aerin Delany PT, DPT 11/03/21, 1:24 PM

## 2021-11-03 NOTE — Assessment & Plan Note (Signed)
Continue cefazolin.  TEE negative for endocarditis. He will need long-term IV antibiotics.  He also has cochlear implant

## 2021-11-03 NOTE — Progress Notes (Signed)
[facility] Progress Note  Patient: Nathan Singleton    YBO:[id number]  DOA: 10/31/2021    Date of Service: the patient was seen and examined on 11/03/2021  Brief hospital course: 80 y.o. 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  11/19: TEE planned for 11/21, on IV antibiotics.  Platelets trending down.  Holding Plavix and subcu heparin.  Continue aspirin and start SCDs for DVT prophylaxis 11/20: TEE tomorrow.  PT recommends SNF.  TOC aware  Assessment and Plan: * Sepsis ([place]) Present on admission secondary to MSSA bacteremia. Met SIRS criteria on admission. Continue IV antibiotic as per ID.  Due to recurrent nature of MSSA bacteremia, patient will require pacemaker removed.   MSSA bacteremia Continue cefazolin.  TEE negative for endocarditis. He will need long-term IV antibiotics.  He also has cochlear implant  Pacemaker St Jude device ID is concerned about possible pacemaker lead infection   Thrombocytopenia ([place]) Dropping platelets so holding Plavix and subcu heparin.  Continue aspirin.  Platelets improved some 39->41  Coronary artery disease involving coronary bypass graft of native heart with angina pectoris ([place]) Echo showing preserved LV systolic function.  No plan for ischemic work-up while in the hospital.  Cardiology following.  Holding Plavix due to dropping platelets.  Continue aspirin  Acute renal failure superimposed on stage 3a chronic kidney disease (HCC) Baseline serum creatinine 1.1-1.2. On presentation serum creatinine 1.59.  Meeting AKI criteria. Currently renal function stable.  Monitor.  Chronic kidney disease, stage 3a ([place]) Close to baseline.  Creatinine of 1.7  Status post transcatheter aortic valve replacement (TAVR) using  bioprosthesis 2019 Echo showing normal structure and function of aortic valve prosthesis Patient will require long-term suppressive antibiotic post IV antibiotic.  Anemia of chronic disease Hemoglobin stable monitor closely.  No obvious bleeding    Body mass index is 26.7 kg/m.        Subjective: No nausea no vomiting no fever no chills.  Patient was sleeping at the time of my evaluation.  Objective: Blood pressure stable.  Heart rate stable. General: Appear in mild distress, no Rash; Oral Mucosa Clear, moist. no Abnormal Neck Mass Or lumps, Conjunctiva normal  Cardiovascular: S1 and S2 Present, no Murmur, Respiratory: good respiratory effort, Bilateral Air entry present and CTA, no Crackles, no wheezes Abdomen: Bowel Sound present, Soft and no tenderness Extremities: no Pedal edema Neurology: alert and oriented to time, place, and person affect appropriate. no new focal deficit Gait not checked due to patient safety concerns   Data Reviewed: My review of labs, imaging, notes and other tests is significant for     stable creatinine but on higher side, stable platelet but on the lower side.  Disposition:  Status is: Inpatient  Remains inpatient appropriate because: Unsafe discharge.  Need further planning for antibiotic and pacemaker removal.   Family Communication: Family at bedside.  DVT Prophylaxis: Place and maintain sequential compression device Start: 11/02/21 1505 Place TED hose Start: 11/02/21 1505 Place and maintain sequential compression device Start: 11/01/21 1508   Time spent: 35 minutes.   Author: Berle Mull  11/03/2021 8:05 PM  To reach On-call, see care teams to locate the attending and reach out via [email or web address]. Between 7PM-7AM, please contact night-coverage If you still have difficulty reaching the attending provider, please page the [facility] (  Director on Call) for [facility] on [place] for assistance.

## 2021-11-03 NOTE — TOC Progression Note (Signed)
Transition of Care [facility]) - Progression Note    Patient Details  Name: Yasseen Salls MRN: [id number] Date of Birth: [birth year]  Transition of Care [facility]) CM/SW [place], [place] Phone Number: 11/03/2021, 9:29 AM  Clinical Narrative:     [place] Must has issued PASRR number:  [id number]    Expected Discharge Plan: [place] Barriers to Discharge: Continued Medical Work up, [place] ([place])  Expected Discharge Plan and Services Expected Discharge Plan: [place] Choice: [place] Living arrangements for the past 2 months: Single Family Home                                       Social Determinants of Health (SDOH) Interventions    Readmission Risk Interventions Readmission Risk Prevention Plan 11/02/2021 05/07/2021 03/23/2021  Transportation Screening - Complete Complete  Medication Review Press photographer) - Complete Complete  PCP or Specialist appointment within 3-5 days of discharge Complete Complete Complete  HRI or [place] - Complete Complete  SW Recovery Care/Counseling Consult Complete Complete Complete  Palliative Care Screening Not Applicable Not Applicable Not [place] Complete Not Applicable Not Applicable

## 2021-11-03 NOTE — Assessment & Plan Note (Signed)
Baseline serum creatinine 1.1-1.2. On presentation serum creatinine 1.59.  Meeting AKI criteria. Currently renal function stable.  Monitor.

## 2021-11-03 NOTE — Progress Notes (Signed)
Date of Admission:  10/31/2021   T  ID: Chisum Habenicht is a 80 y.o. 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   Pacemaker [employer] device   Coronary artery disease involving coronary bypass graft of native heart with angina pectoris (HCC)   Thrombocytopenia (HCC)   Chronic kidney disease, stage 3a ([place])   MSSA bacteremia   Anemia of chronic disease    Subjective: Had TEE this morning No specific complaints Wife at bed side Medications:   allopurinol  100 mg Oral Daily   aspirin EC  81 mg Oral Daily   butamben-tetracaine-benzocaine       carvedilol  6.25 mg Oral BID WC   fentaNYL       fluticasone furoate-vilanterol  1 puff Inhalation QHS   gabapentin  100 mg Oral Daily   insulin aspart  0-9 Units Subcutaneous TID WC   insulin glargine-yfgn  18 Units Subcutaneous QHS   lactulose  10 g Oral BID   lidocaine       midazolam       pantoprazole  40 mg Oral Daily   polyethylene glycol  17 g Oral Daily   sertraline  200 mg Oral Daily   sucralfate  1 g Oral QID   tamsulosin  0.4 mg Oral Daily    Objective: Vital signs in last 24 hours: Temp:  [97.3 F (36.3 C)-98.7 F (37.1 C)] 98.7 F (37.1 C) (11/21 0755) Pulse Rate:  [64-75] 64 (11/21 0755) Resp:  [18-20] 20 (11/21 0755) BP: (130-154)/(62-81) 130/62 (11/21 0755) SpO2:  [97 %-100 %] 99 % (11/21 0755)  PHYSICAL EXAM:  General: Alert, cooperative, no distress, appears stated age.  Head: Normocephalic, without obvious abnormality, atraumatic. Eyes: Conjunctivae clear, anicteric sclerae. Pupils are equal ENT Nares normal. No drainage or sinus tenderness.hard of hearing Lips, mucosa, and tongue normal. No Thrush Neck: Supple, symmetrical, no adenopathy, thyroid: non tender no carotid bruit and no JVD. Back: No CVA tenderness. Lungs: Clear to auscultation bilaterally. No Wheezing or Rhonchi. No rales. Heart: Regular  rate and rhythm, no murmur, rub or gallop. pacemaker Abdomen: Soft, non-tender,not distended. Bowel sounds normal. No masses Extremities: atraumatic, no cyanosis. No edema. No clubbing Skin: No rashes or lesions. Or bruising Lymph: Cervical, supraclavicular normal. Neurologic: Grossly non-focal  Lab Results Recent Labs    11/02/21 0553 11/03/21 0536  WBC 3.7* 4.2  HGB 9.1* 8.6*  HCT 27.2* 25.2*  NA 132* 135  K 3.4* 3.3*  CL 104 104  CO2 23 22  BUN 47* 44*  CREATININE 1.64* 1.59*   Liver Panel Recent Labs    11/01/21 0736 11/02/21 0553  PROT 5.4* 5.7*  ALBUMIN 2.5* 2.7*  AST 219* 191*  ALT 141* 75*  ALKPHOS 60 65  BILITOT 0.5 0.3   Sedimentation Rate No results for input(s): ESRSEDRATE in the last 72 hours. C-Reactive Protein No results for input(s): CRP in the last 72 hours.  Microbiology: 10/30/21- BC- MSSA 10/31/21 BC MSSA Studies/Results: No results found.   Assessment/Plan: Recurrent MSSA bacteremia.  This is his third episode this year.  The previous 2 times it was associated with infection on the right foot.  He was treated both times with 6 weeks of IV antibiotics followed by oral antibiotics.  This time there is no clear source.  the pacemaker wire is infected.  TEE did not show any vegetation It still needs to be removed.  H/o TAVR This time the patient will need long term oral suppressive therapy after completion of 6 weeks of IV   Continue cefazolin. Repeat blood culture Seen by cardiologist.    CKD  Thrombocytopenia  Anemia  Diabetes mellitus management as per primary team  Hypertension management as per primary team.  Peripheral vascular disease.   History of right toes amputation. _History of bilateral knee replacement   Discussed the management with the patient and his wife and cardiologist

## 2021-11-03 NOTE — Assessment & Plan Note (Signed)
ID is concerned about possible pacemaker lead infection

## 2021-11-03 NOTE — Assessment & Plan Note (Addendum)
Dropping platelets so holding Plavix and subcu heparin.  Continue aspirin.  Platelets remained stable.  plt count 102 on admission. In 47s since last 3 days.  Started to drop on second day of admission.  INR mildly elevated and fibrinogen 258. Mild AKI. Pt will benefit from hematology evaluation at [facility].

## 2021-11-03 NOTE — Assessment & Plan Note (Signed)
Echo showing normal structure and function of aortic valve prosthesis Patient will require long-term suppressive antibiotic post IV antibiotic.

## 2021-11-03 NOTE — Assessment & Plan Note (Signed)
Close to baseline.  Creatinine of 1.7

## 2021-11-03 NOTE — Assessment & Plan Note (Signed)
Present on admission secondary to MSSA bacteremia. Met SIRS criteria on admission. Continue IV antibiotic as per ID.  Due to recurrent nature of MSSA bacteremia, patient will require pacemaker removed.

## 2021-11-03 NOTE — Assessment & Plan Note (Signed)
Hemoglobin stable monitor closely.  No obvious bleeding

## 2021-11-03 NOTE — Progress Notes (Signed)
OT Cancellation Note  Patient Details Name: Joeseph Verville MRN: [id number] DOB: June 22, 1941   Cancelled Treatment:    Reason Eval/Treat Not Completed: Patient at procedure or test/ unavailable (Pt. is currently at TEE per nursing. Will reattempt the inital OT evaluation at a later time.)  Harrel Carina, MS, OTR/L 11/03/2021, 1:26 PM

## 2021-11-03 NOTE — Progress Notes (Addendum)
Progress Note  Patient Name: Nathan Singleton Date of Encounter: 11/03/2021  [facility] Cardiologist:   Subjective   Plan for TEE today. Patient denies chest pain or SOB.   Inpatient Medications    Scheduled Meds:  allopurinol  100 mg Oral Daily   aspirin EC  81 mg Oral Daily   carvedilol  6.25 mg Oral BID WC   fluticasone furoate-vilanterol  1 puff Inhalation QHS   gabapentin  100 mg Oral Daily   insulin aspart  0-9 Units Subcutaneous TID WC   insulin glargine-yfgn  18 Units Subcutaneous QHS   lactulose  10 g Oral BID   pantoprazole  40 mg Oral Daily   polyethylene glycol  17 g Oral Daily   sertraline  200 mg Oral Daily   sucralfate  1 g Oral QID   tamsulosin  0.4 mg Oral Daily   Continuous Infusions:  sodium chloride      ceFAZolin (ANCEF) IV 2 g (11/03/21 0626)   PRN Meds: acetaminophen, albuterol, loperamide, ondansetron (ZOFRAN) IV   Vital Signs    Vitals:   11/02/21 2038 11/03/21 0020 11/03/21 0409 11/03/21 0755  BP: (!) 147/81 (!) 151/68 (!) 154/75 130/62  Pulse: 73 73 69 64  Resp: 18 18 19 20   Temp: 98.5 F (36.9 C) 98.6 F (37 C) 98.4 F (36.9 C) 98.7 F (37.1 C)  TempSrc: Oral Oral    SpO2: 98% 97% 100% 99%  Weight:      Height:       No intake or output data in the 24 hours ending 11/03/21 1103 Last 3 Weights 10/31/2021 10/30/2021 10/18/2021  Weight (lbs) 219 lb 5.7 oz 219 lb 5.7 oz 219 lb 5.7 oz  Weight (kg) 99.5 kg 99.5 kg 99.5 kg      Telemetry    SR/Avpaced rhythm, HR 60-70s, PVCs - Personally Reviewed  ECG    No new - Personally Reviewed  Physical Exam   GEN: No acute distress.   Neck: No JVD Cardiac: RRR, no murmurs, rubs, or gallops.  Respiratory: Clear to auscultation bilaterally. GI: Soft, nontender, non-distended  MS: No edema; No deformity. Neuro:  Nonfocal  Psych: Normal affect   Labs    High Sensitivity Troponin:   Recent Labs  Lab 10/17/21 0726 10/17/21 1112 10/17/21 1305 10/31/21 1210 10/31/21 1502   TROPONINIHS 94* 89* 81* 277* 286*     Chemistry Recent Labs  Lab 10/31/21 1013 11/01/21 0736 11/02/21 0553 11/03/21 0536  NA 136 131* 132* 135  K 3.5 3.5 3.4* 3.3*  CL 106 102 104 104  CO2 21* 22 23 22   GLUCOSE 120* 182* 108* 174*  BUN 40* 50* 47* 44*  CREATININE 1.88* 1.77* 1.64* 1.59*  CALCIUM 7.3* 7.4* 7.8* 7.8*  PROT 5.4* 5.4* 5.7*  --   ALBUMIN 2.9* 2.5* 2.7*  --   AST 352* 219* 191*  --   ALT 231* 141* 75*  --   ALKPHOS 58 60 65  --   BILITOT 0.6 0.5 0.3  --   GFRNONAA 36* 39* 42* 44*  ANIONGAP 9 7 5 9     Lipids No results for input(s): CHOL, TRIG, HDL, LABVLDL, LDLCALC, CHOLHDL in the last 168 hours.  Hematology Recent Labs  Lab 11/01/21 0736 11/02/21 0553 11/03/21 0536  WBC 4.5 3.7* 4.2  RBC 2.83* 2.98* 2.82*  HGB 8.9* 9.1* 8.6*  HCT 25.6* 27.2* 25.2*  MCV 90.5 91.3 89.4  MCH 31.4 30.5 30.5  MCHC 34.8 33.5 34.1  RDW 13.4 13.5 13.5  PLT 37* 41* 43*   Thyroid No results for input(s): TSH, FREET4 in the last 168 hours.  BNPNo results for input(s): BNP, PROBNP in the last 168 hours.  DDimer No results for input(s): DDIMER in the last 168 hours.   Radiology    No results found.  Cardiac Studies   Echo 10/31/21  1. Left ventricular ejection fraction, by estimation, is 60 to 65%. The  left ventricle has normal function. The left ventricle has no regional  wall motion abnormalities. There is mild left ventricular hypertrophy.  Left ventricular diastolic parameters  are consistent with Grade I diastolic dysfunction (impaired relaxation).   2. Right ventricular systolic function is normal. The right ventricular  size is normal. Tricuspid regurgitation signal is inadequate for assessing  PA pressure.   3. Left atrial size was mildly dilated.   4. The mitral valve is normal in structure. No evidence of mitral valve  regurgitation. No evidence of mitral stenosis.   5. The aortic valve was not well visualized. Aortic valve regurgitation  is not  visualized. No aortic stenosis is present. Echo findings are  consistent with normal structure and function of the aortic valve  prosthesis. Aortic valve mean gradient measures   6.5 mmHg.   Patient Profile     80 y.o. 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, recurrent MSSA bacteremia, anemia, SNHL status post cochlear implant, GERD, obesity status post gastric bypass in 08/2012, bipolar disorder, OSA, falls, and OA  who is being seen today for the evaluation of elevated troponin   Assessment & Plan    Recurrent MSSA bacteremia/Sepsis - third admission for bacteremia, strong suspicion for endocarditis from pacemaker - TEE 05/2021 showed a small structure in the right atrium attached to the pacemaker lead - Need EP consult to consider device extraction and TEE 11/21 - abx per IM   CAD s/p remote PCI Elevated troponin - HS trop elevated up to 286 - No anginal symptoms - No indication for IV heparin - Echo showed preserved LVEF - elevated troponin likely demand ischemia in the setting of CAD with MSSA bacteremia, hypotension, renal dysfunction, anemia - no plans for ischemic evaluation at this time - continue ASA, Coreg - Plavix and subq heparin held for thrombocytopenia, continue to monitor - statin held for elevated LFTS>>restart when able   Aortic stenosis s/p TAVR - echo showed normal structure and function of valve - Plan for TEE  as above r/o endocarditis  SSS s/p PPM - EP consult  for possible device explantation given bacteremia x3.   For questions or updates, please contact [place] Please consult [email or web address] for contact info under        Signed, Dontrel Smethers Ninfa Meeker, PA-C   11/03/2021, 11:03 AM

## 2021-11-04 ENCOUNTER — Encounter: Payer: Self-pay | Admitting: Cardiovascular Disease

## 2021-11-04 ENCOUNTER — Inpatient Hospital Stay (HOSPITAL_COMMUNITY)
Admission: AD | Admit: 2021-11-04 | Discharge: 2021-11-13 | DRG: 260 | Disposition: E | Payer: 59 | Source: Other Acute Inpatient Hospital | Attending: Internal Medicine | Admitting: Internal Medicine

## 2021-11-04 DIAGNOSIS — R7881 Bacteremia: Secondary | ICD-10-CM

## 2021-11-04 DIAGNOSIS — Z66 Do not resuscitate: Secondary | ICD-10-CM | POA: Diagnosis not present

## 2021-11-04 DIAGNOSIS — Z833 Family history of diabetes mellitus: Secondary | ICD-10-CM

## 2021-11-04 DIAGNOSIS — E871 Hypo-osmolality and hyponatremia: Secondary | ICD-10-CM | POA: Diagnosis not present

## 2021-11-04 DIAGNOSIS — E1151 Type 2 diabetes mellitus with diabetic peripheral angiopathy without gangrene: Secondary | ICD-10-CM | POA: Diagnosis present

## 2021-11-04 DIAGNOSIS — Z9884 Bariatric surgery status: Secondary | ICD-10-CM

## 2021-11-04 DIAGNOSIS — G936 Cerebral edema: Secondary | ICD-10-CM | POA: Diagnosis not present

## 2021-11-04 DIAGNOSIS — G9341 Metabolic encephalopathy: Secondary | ICD-10-CM | POA: Diagnosis not present

## 2021-11-04 DIAGNOSIS — L97429 Non-pressure chronic ulcer of left heel and midfoot with unspecified severity: Secondary | ICD-10-CM | POA: Diagnosis present

## 2021-11-04 DIAGNOSIS — M1990 Unspecified osteoarthritis, unspecified site: Secondary | ICD-10-CM | POA: Diagnosis present

## 2021-11-04 DIAGNOSIS — I495 Sick sinus syndrome: Secondary | ICD-10-CM | POA: Diagnosis present

## 2021-11-04 DIAGNOSIS — I619 Nontraumatic intracerebral hemorrhage, unspecified: Secondary | ICD-10-CM | POA: Diagnosis not present

## 2021-11-04 DIAGNOSIS — L97419 Non-pressure chronic ulcer of right heel and midfoot with unspecified severity: Secondary | ICD-10-CM | POA: Diagnosis present

## 2021-11-04 DIAGNOSIS — D638 Anemia in other chronic diseases classified elsewhere: Secondary | ICD-10-CM | POA: Diagnosis present

## 2021-11-04 DIAGNOSIS — N179 Acute kidney failure, unspecified: Secondary | ICD-10-CM | POA: Diagnosis not present

## 2021-11-04 DIAGNOSIS — A4101 Sepsis due to Methicillin susceptible Staphylococcus aureus: Secondary | ICD-10-CM | POA: Diagnosis present

## 2021-11-04 DIAGNOSIS — T827XXA Infection and inflammatory reaction due to other cardiac and vascular devices, implants and grafts, initial encounter: Secondary | ICD-10-CM | POA: Diagnosis present

## 2021-11-04 DIAGNOSIS — E1121 Type 2 diabetes mellitus with diabetic nephropathy: Secondary | ICD-10-CM | POA: Diagnosis present

## 2021-11-04 DIAGNOSIS — B9561 Methicillin susceptible Staphylococcus aureus infection as the cause of diseases classified elsewhere: Secondary | ICD-10-CM | POA: Diagnosis present

## 2021-11-04 DIAGNOSIS — Z96653 Presence of artificial knee joint, bilateral: Secondary | ICD-10-CM | POA: Diagnosis present

## 2021-11-04 DIAGNOSIS — I629 Nontraumatic intracranial hemorrhage, unspecified: Secondary | ICD-10-CM | POA: Diagnosis not present

## 2021-11-04 DIAGNOSIS — I25709 Atherosclerosis of coronary artery bypass graft(s), unspecified, with unspecified angina pectoris: Secondary | ICD-10-CM | POA: Diagnosis present

## 2021-11-04 DIAGNOSIS — E118 Type 2 diabetes mellitus with unspecified complications: Secondary | ICD-10-CM | POA: Diagnosis present

## 2021-11-04 DIAGNOSIS — H9190 Unspecified hearing loss, unspecified ear: Secondary | ICD-10-CM | POA: Diagnosis present

## 2021-11-04 DIAGNOSIS — Z794 Long term (current) use of insulin: Secondary | ICD-10-CM

## 2021-11-04 DIAGNOSIS — N1831 Chronic kidney disease, stage 3a: Secondary | ICD-10-CM | POA: Diagnosis present

## 2021-11-04 DIAGNOSIS — Z89421 Acquired absence of other right toe(s): Secondary | ICD-10-CM

## 2021-11-04 DIAGNOSIS — Z87891 Personal history of nicotine dependence: Secondary | ICD-10-CM

## 2021-11-04 DIAGNOSIS — K521 Toxic gastroenteritis and colitis: Secondary | ICD-10-CM

## 2021-11-04 DIAGNOSIS — R339 Retention of urine, unspecified: Secondary | ICD-10-CM | POA: Diagnosis not present

## 2021-11-04 DIAGNOSIS — R338 Other retention of urine: Secondary | ICD-10-CM | POA: Diagnosis not present

## 2021-11-04 DIAGNOSIS — Z419 Encounter for procedure for purposes other than remedying health state, unspecified: Secondary | ICD-10-CM

## 2021-11-04 DIAGNOSIS — Y713 Surgical instruments, materials and cardiovascular devices (including sutures) associated with adverse incidents: Secondary | ICD-10-CM | POA: Diagnosis present

## 2021-11-04 DIAGNOSIS — I248 Other forms of acute ischemic heart disease: Secondary | ICD-10-CM | POA: Diagnosis present

## 2021-11-04 DIAGNOSIS — G911 Obstructive hydrocephalus: Secondary | ICD-10-CM | POA: Diagnosis not present

## 2021-11-04 DIAGNOSIS — Z820 Family history of epilepsy and other diseases of the nervous system: Secondary | ICD-10-CM

## 2021-11-04 DIAGNOSIS — E1122 Type 2 diabetes mellitus with diabetic chronic kidney disease: Secondary | ICD-10-CM | POA: Diagnosis present

## 2021-11-04 DIAGNOSIS — E11621 Type 2 diabetes mellitus with foot ulcer: Secondary | ICD-10-CM | POA: Diagnosis present

## 2021-11-04 DIAGNOSIS — R7401 Elevation of levels of liver transaminase levels: Secondary | ICD-10-CM | POA: Diagnosis present

## 2021-11-04 DIAGNOSIS — Z881 Allergy status to other antibiotic agents status: Secondary | ICD-10-CM

## 2021-11-04 DIAGNOSIS — I2510 Atherosclerotic heart disease of native coronary artery without angina pectoris: Secondary | ICD-10-CM | POA: Diagnosis present

## 2021-11-04 DIAGNOSIS — Z952 Presence of prosthetic heart valve: Secondary | ICD-10-CM | POA: Diagnosis not present

## 2021-11-04 DIAGNOSIS — I459 Conduction disorder, unspecified: Secondary | ICD-10-CM | POA: Diagnosis present

## 2021-11-04 DIAGNOSIS — I152 Hypertension secondary to endocrine disorders: Secondary | ICD-10-CM | POA: Diagnosis present

## 2021-11-04 DIAGNOSIS — E876 Hypokalemia: Secondary | ICD-10-CM | POA: Diagnosis not present

## 2021-11-04 DIAGNOSIS — T3695XA Adverse effect of unspecified systemic antibiotic, initial encounter: Secondary | ICD-10-CM

## 2021-11-04 DIAGNOSIS — Z515 Encounter for palliative care: Secondary | ICD-10-CM

## 2021-11-04 DIAGNOSIS — E1169 Type 2 diabetes mellitus with other specified complication: Secondary | ICD-10-CM | POA: Diagnosis present

## 2021-11-04 DIAGNOSIS — Z950 Presence of cardiac pacemaker: Secondary | ICD-10-CM

## 2021-11-04 DIAGNOSIS — E0821 Diabetes mellitus due to underlying condition with diabetic nephropathy: Secondary | ICD-10-CM | POA: Diagnosis not present

## 2021-11-04 DIAGNOSIS — E785 Hyperlipidemia, unspecified: Secondary | ICD-10-CM | POA: Diagnosis present

## 2021-11-04 DIAGNOSIS — Z7982 Long term (current) use of aspirin: Secondary | ICD-10-CM

## 2021-11-04 DIAGNOSIS — D696 Thrombocytopenia, unspecified: Secondary | ICD-10-CM | POA: Diagnosis present

## 2021-11-04 DIAGNOSIS — Z79899 Other long term (current) drug therapy: Secondary | ICD-10-CM

## 2021-11-04 DIAGNOSIS — Z7951 Long term (current) use of inhaled steroids: Secondary | ICD-10-CM

## 2021-11-04 DIAGNOSIS — R401 Stupor: Secondary | ICD-10-CM | POA: Diagnosis not present

## 2021-11-04 DIAGNOSIS — I4510 Unspecified right bundle-branch block: Secondary | ICD-10-CM | POA: Diagnosis present

## 2021-11-04 DIAGNOSIS — Z955 Presence of coronary angioplasty implant and graft: Secondary | ICD-10-CM

## 2021-11-04 DIAGNOSIS — Z888 Allergy status to other drugs, medicaments and biological substances status: Secondary | ICD-10-CM

## 2021-11-04 DIAGNOSIS — I442 Atrioventricular block, complete: Secondary | ICD-10-CM | POA: Diagnosis not present

## 2021-11-04 DIAGNOSIS — R7402 Elevation of levels of lactic acid dehydrogenase (LDH): Secondary | ICD-10-CM | POA: Diagnosis present

## 2021-11-04 DIAGNOSIS — Z8249 Family history of ischemic heart disease and other diseases of the circulatory system: Secondary | ICD-10-CM

## 2021-11-04 LAB — CBC WITH DIFFERENTIAL/PLATELET
Abs Immature Granulocytes: 0.13 10*3/uL — ABNORMAL HIGH (ref 0.00–0.07)
Basophils Absolute: 0 10*3/uL (ref 0.0–0.1)
Basophils Relative: 0 %
Eosinophils Absolute: 0 10*3/uL (ref 0.0–0.5)
Eosinophils Relative: 1 %
HCT: 23.9 % — ABNORMAL LOW (ref 39.0–52.0)
Hemoglobin: 8.3 g/dL — ABNORMAL LOW (ref 13.0–17.0)
Immature Granulocytes: 3 %
Lymphocytes Relative: 6 %
Lymphs Abs: 0.3 10*3/uL — ABNORMAL LOW (ref 0.7–4.0)
MCH: 31.7 pg (ref 26.0–34.0)
MCHC: 34.7 g/dL (ref 30.0–36.0)
MCV: 91.2 fL (ref 80.0–100.0)
Monocytes Absolute: 0.3 10*3/uL (ref 0.1–1.0)
Monocytes Relative: 8 %
Neutro Abs: 3.2 10*3/uL (ref 1.7–7.7)
Neutrophils Relative %: 82 %
Platelets: 42 10*3/uL — ABNORMAL LOW (ref 150–400)
RBC: 2.62 MIL/uL — ABNORMAL LOW (ref 4.22–5.81)
RDW: 13.9 % (ref 11.5–15.5)
WBC: 4 10*3/uL (ref 4.0–10.5)
nRBC: 0 % (ref 0.0–0.2)

## 2021-11-04 LAB — TECHNOLOGIST SMEAR REVIEW

## 2021-11-04 LAB — GLUCOSE, CAPILLARY
Glucose-Capillary: 273 mg/dL — ABNORMAL HIGH (ref 70–99)
Glucose-Capillary: 267 mg/dL — ABNORMAL HIGH (ref 70–99)
Glucose-Capillary: 269 mg/dL — ABNORMAL HIGH (ref 70–99)
Glucose-Capillary: 202 mg/dL — ABNORMAL HIGH (ref 70–99)
Glucose-Capillary: 124 mg/dL — ABNORMAL HIGH (ref 70–99)

## 2021-11-04 LAB — VITAMIN B12: Vitamin B-12: 1380 pg/mL — ABNORMAL HIGH (ref 180–914)

## 2021-11-04 LAB — HEPATITIS PANEL, ACUTE
HCV Ab: NONREACTIVE
Hep A IgM: NONREACTIVE
Hep B C IgM: NONREACTIVE
Hepatitis B Surface Ag: NONREACTIVE

## 2021-11-04 LAB — PROTIME-INR
INR: 1.6 — ABNORMAL HIGH (ref 0.8–1.2)
Prothrombin Time: 19.2 seconds — ABNORMAL HIGH (ref 11.4–15.2)

## 2021-11-04 LAB — FIBRINOGEN: Fibrinogen: 258 mg/dL (ref 210–475)

## 2021-11-04 MED ORDER — INSULIN GLARGINE-YFGN 100 UNIT/ML ~~LOC~~ SOLN
11.0000 [IU] | Freq: Every day | SUBCUTANEOUS | Status: DC
  Administered 2021-11-04 – 2021-11-08 (×5): 11 [IU] via SUBCUTANEOUS
  Filled 2021-11-04 (×7): qty 0.11

## 2021-11-04 MED ORDER — ACETAMINOPHEN 650 MG RE SUPP: 650.0000 mg | Freq: Four times a day (QID) | RECTAL | Status: DC | PRN

## 2021-11-04 MED ORDER — PANTOPRAZOLE SODIUM 40 MG PO TBEC
40.0000 mg | DELAYED_RELEASE_TABLET | Freq: Every day | ORAL | Status: DC
  Administered 2021-11-05: 40 mg via ORAL
  Filled 2021-11-04: qty 1

## 2021-11-04 MED ORDER — INSULIN GLARGINE-YFGN 100 UNIT/ML ~~LOC~~ SOLN
18.0000 [IU] | Freq: Every day | SUBCUTANEOUS | Status: DC
  Filled 2021-11-04: qty 0.18

## 2021-11-04 MED ORDER — ALBUTEROL SULFATE (2.5 MG/3ML) 0.083% IN NEBU: 2.5000 mg | INHALATION_SOLUTION | RESPIRATORY_TRACT | Status: DC | PRN

## 2021-11-04 MED ORDER — SACCHAROMYCES BOULARDII 250 MG PO CAPS: 250.0000 mg | ORAL_CAPSULE | Freq: Two times a day (BID) | ORAL | Status: AC

## 2021-11-04 MED ORDER — INSULIN ASPART 100 UNIT/ML IJ SOLN
0.0000 [IU] | Freq: Three times a day (TID) | INTRAMUSCULAR | Status: DC
Start: 2021-11-05 — End: 2021-11-06
  Administered 2021-11-05: 9 [IU] via SUBCUTANEOUS
  Administered 2021-11-05 (×2): 3 [IU] via SUBCUTANEOUS
  Administered 2021-11-06: 7 [IU] via SUBCUTANEOUS
  Administered 2021-11-06: 5 [IU] via SUBCUTANEOUS

## 2021-11-04 MED ORDER — CARVEDILOL 6.25 MG PO TABS
6.2500 mg | ORAL_TABLET | Freq: Two times a day (BID) | ORAL | Status: DC
  Administered 2021-11-05 – 2021-11-06 (×4): 6.25 mg via ORAL
  Filled 2021-11-04 (×4): qty 1

## 2021-11-04 MED ORDER — CEFAZOLIN SODIUM-DEXTROSE 2-4 GM/100ML-% IV SOLN: 2.0000 g | Freq: Three times a day (TID) | INTRAVENOUS | Status: DC

## 2021-11-04 MED ORDER — ACETAMINOPHEN 325 MG PO TABS
650.0000 mg | ORAL_TABLET | Freq: Four times a day (QID) | ORAL | Status: DC | PRN
  Administered 2021-11-04 – 2021-11-08 (×7): 650 mg via ORAL
  Filled 2021-11-04 (×7): qty 2

## 2021-11-04 MED ORDER — ALLOPURINOL 100 MG PO TABS
100.0000 mg | ORAL_TABLET | Freq: Every day | ORAL | Status: DC
  Administered 2021-11-05 – 2021-11-09 (×4): 100 mg via ORAL
  Filled 2021-11-04 (×4): qty 1

## 2021-11-04 MED ORDER — ASPIRIN EC 81 MG PO TBEC
81.0000 mg | DELAYED_RELEASE_TABLET | Freq: Every day | ORAL | Status: DC
  Administered 2021-11-05 – 2021-11-06 (×2): 81 mg via ORAL
  Filled 2021-11-04 (×2): qty 1

## 2021-11-04 MED ORDER — MOMETASONE FURO-FORMOTEROL FUM 100-5 MCG/ACT IN AERO
2.0000 | INHALATION_SPRAY | Freq: Two times a day (BID) | RESPIRATORY_TRACT | Status: DC
  Administered 2021-11-04 – 2021-11-09 (×8): 2 via RESPIRATORY_TRACT
  Filled 2021-11-04: qty 8.8

## 2021-11-04 MED ORDER — CEFAZOLIN SODIUM-DEXTROSE 2-4 GM/100ML-% IV SOLN
2.0000 g | Freq: Three times a day (TID) | INTRAVENOUS | Status: DC
  Administered 2021-11-04 – 2021-11-07 (×8): 2 g via INTRAVENOUS
  Filled 2021-11-04 (×9): qty 100

## 2021-11-04 MED ORDER — SACCHAROMYCES BOULARDII 250 MG PO CAPS
250.0000 mg | ORAL_CAPSULE | Freq: Two times a day (BID) | ORAL | Status: DC
  Administered 2021-11-04 – 2021-11-09 (×9): 250 mg via ORAL
  Filled 2021-11-04 (×9): qty 1

## 2021-11-04 MED ORDER — SERTRALINE HCL 100 MG PO TABS
200.0000 mg | ORAL_TABLET | Freq: Every day | ORAL | Status: DC
  Administered 2021-11-05 – 2021-11-09 (×4): 200 mg via ORAL
  Filled 2021-11-04 (×4): qty 2

## 2021-11-04 MED ORDER — SUCRALFATE 1 G PO TABS
1.0000 g | ORAL_TABLET | Freq: Four times a day (QID) | ORAL | Status: DC
Start: 2021-11-04 — End: 2021-11-09
  Administered 2021-11-04 – 2021-11-09 (×17): 1 g via ORAL
  Filled 2021-11-04 (×18): qty 1

## 2021-11-04 MED ORDER — ALBUTEROL SULFATE HFA 108 (90 BASE) MCG/ACT IN AERS: 2.0000 | INHALATION_SPRAY | RESPIRATORY_TRACT | Status: DC | PRN

## 2021-11-04 MED ORDER — POTASSIUM CHLORIDE CRYS ER 10 MEQ PO TBCR
10.0000 meq | EXTENDED_RELEASE_TABLET | Freq: Once | ORAL | Status: AC
  Administered 2021-11-04: 10 meq via ORAL
  Filled 2021-11-04: qty 1

## 2021-11-04 MED ORDER — GABAPENTIN 100 MG PO CAPS
100.0000 mg | ORAL_CAPSULE | Freq: Every day | ORAL | Status: DC
  Administered 2021-11-05 – 2021-11-09 (×4): 100 mg via ORAL
  Filled 2021-11-04 (×4): qty 1

## 2021-11-04 MED ORDER — ROSUVASTATIN CALCIUM 20 MG PO TABS: 40.0000 mg | ORAL_TABLET | Freq: Every day | ORAL | Status: DC

## 2021-11-04 MED ORDER — TAMSULOSIN HCL 0.4 MG PO CAPS
0.4000 mg | ORAL_CAPSULE | Freq: Every day | ORAL | Status: DC
  Administered 2021-11-05 – 2021-11-06 (×2): 0.4 mg via ORAL
  Filled 2021-11-04 (×2): qty 1

## 2021-11-04 NOTE — Assessment & Plan Note (Signed)
Reported 5BM in last 24 hours, no blood. No abdominal pain, no nausea or vomiting. No tenderness on exam.  Mostly associated with Antibiotics and lactulose+miralax.  Will stop stool softeners and add probiotics.

## 2021-11-04 NOTE — Progress Notes (Signed)
Progress Note  Patient Name: Nathan Singleton Date of Encounter: 10/23/2021  [facility] Cardiologist: [place] for transfer to [facility] today for device extraction. Patient reports feels weaker today.   Inpatient Medications    Scheduled Meds:  allopurinol  100 mg Oral Daily   aspirin EC  81 mg Oral Daily   carvedilol  6.25 mg Oral BID WC   fluticasone furoate-vilanterol  1 puff Inhalation QHS   gabapentin  100 mg Oral Daily   insulin aspart  0-9 Units Subcutaneous TID WC   insulin glargine-yfgn  18 Units Subcutaneous QHS   lactulose  10 g Oral BID   pantoprazole  40 mg Oral Daily   polyethylene glycol  17 g Oral Daily   sertraline  200 mg Oral Daily   sucralfate  1 g Oral QID   tamsulosin  0.4 mg Oral Daily   Continuous Infusions:   ceFAZolin (ANCEF) IV 2 g (11/08/2021 0646)   PRN Meds: acetaminophen, albuterol, loperamide, ondansetron (ZOFRAN) IV   Vital Signs    Vitals:   11/03/21 2342 10/20/2021 0424 11/12/2021 0818 10/18/2021 1158  BP: 138/66 140/66 131/63 137/63  Pulse: 71 76 67 67  Resp: 20 (!) 22 20 (!) 22  Temp: 98.4 F (36.9 C) 100.1 F (37.8 C) 98.7 F (37.1 C) 99 F (37.2 C)  TempSrc: Oral  Oral Oral  SpO2: 100% 98% 98% 99%  Weight:      Height:        Intake/Output Summary (Last 24 hours) at 11/03/2021 1232 Last data filed at 10/30/2021 1000 Gross per 24 hour  Intake 1800 ml  Output 600 ml  Net 1200 ml   Last 3 Weights 11/03/2021 10/31/2021 10/30/2021  Weight (lbs) 219 lb 5.7 oz 219 lb 5.7 oz 219 lb 5.7 oz  Weight (kg) 99.5 kg 99.5 kg 99.5 kg      Telemetry    AV paced rhthm - Personally Reviewed  ECG    No new - Personally Reviewed  Physical Exam   GEN: No acute distress.   Neck: No JVD Cardiac: RRR, no murmurs, rubs, or gallops.  Respiratory: Clear to auscultation bilaterally. GI: Soft, nontender, non-distended  MS: No edema; No deformity. Neuro:  Nonfocal  Psych: Normal affect   Labs    High Sensitivity  Troponin:   Recent Labs  Lab 10/17/21 0726 10/17/21 1112 10/17/21 1305 10/31/21 1210 10/31/21 1502  TROPONINIHS 94* 89* 81* 277* 286*     Chemistry Recent Labs  Lab 10/31/21 1013 11/01/21 0736 11/02/21 0553 11/03/21 0536  NA 136 131* 132* 135  K 3.5 3.5 3.4* 3.3*  CL 106 102 104 104  CO2 21* 22 23 22   GLUCOSE 120* 182* 108* 174*  BUN 40* 50* 47* 44*  CREATININE 1.88* 1.77* 1.64* 1.59*  CALCIUM 7.3* 7.4* 7.8* 7.8*  PROT 5.4* 5.4* 5.7*  --   ALBUMIN 2.9* 2.5* 2.7*  --   AST 352* 219* 191*  --   ALT 231* 141* 75*  --   ALKPHOS 58 60 65  --   BILITOT 0.6 0.5 0.3  --   GFRNONAA 36* 39* 42* 44*  ANIONGAP 9 7 5 9     Lipids No results for input(s): CHOL, TRIG, HDL, LABVLDL, LDLCALC, CHOLHDL in the last 168 hours.  Hematology Recent Labs  Lab 11/02/21 0553 11/03/21 0536 11/06/2021 1121  WBC 3.7* 4.2 4.0  RBC 2.98* 2.82* 2.62*  HGB 9.1* 8.6* 8.3*  HCT 27.2* 25.2* 23.9*  MCV 91.3 89.4 91.2  MCH 30.5 30.5 31.7  MCHC 33.5 34.1 34.7  RDW 13.5 13.5 13.9  PLT 41* 43* 42*   Thyroid No results for input(s): TSH, FREET4 in the last 168 hours.  BNPNo results for input(s): BNP, PROBNP in the last 168 hours.  DDimer No results for input(s): DDIMER in the last 168 hours.   Radiology    ECHO TEE  Result Date: 11/03/2021    TRANSESOPHOGEAL ECHO REPORT   Patient Name:   Nathan Singleton Date of Exam: 11/03/2021 Medical Rec #:  [id number]      Height:       76.0 in Accession #:    [id number]     Weight:       219.4 lb Date of Birth:  1941/09/25      BSA:          2.304 m Patient Age:    80 years       BP:           130/62 mmHg Patient Gender: M              HR:           64 bpm. Exam Location:  [facility] Procedure: Transesophageal Echo, Cardiac Doppler and Color Doppler Indications:     Bacteremia R783.81  History:         Patient has prior history of Echocardiogram examinations, most                  recent 10/31/2021. CAD, Pacemaker; Risk Factors:Hypertension                  and Diabetes.  S/P TAVR.  Sonographer:     Sherrie Sport Referring Phys:  [id number] Leslie Diagnosing Phys: Kate Sable MD PROCEDURE: The transesophogeal probe was passed without difficulty through the esophogus of the patient. Sedation performed by performing physician. The patient developed no complications during the procedure. IMPRESSIONS  1. Left ventricular ejection fraction, by estimation, is 55 to 60%. The left ventricle has normal function.  2. Right ventricular systolic function is normal. The right ventricular size is normal.  3. No left atrial/left atrial appendage thrombus was detected.  4. The mitral valve is normal in structure. Mild mitral valve regurgitation.  5. The aortic valve has been repaired/replaced. Aortic valve regurgitation is not visualized. Echo findings are consistent with normal structure and function of the aortic valve prosthesis. Conclusion(s)/Recommendation(s): No evidence of vegetation/infective endocarditis on this transesophageael echocardiogram. FINDINGS  Left Ventricle: Left ventricular ejection fraction, by estimation, is 55 to 60%. The left ventricle has normal function. The left ventricular internal cavity size was normal in size. Right Ventricle: The right ventricular size is normal. No increase in right ventricular wall thickness. Right ventricular systolic function is normal. Left Atrium: Left atrial size was normal in size. No left atrial/left atrial appendage thrombus was detected. Right Atrium: Right atrial size was normal in size. Pericardium: There is no evidence of pericardial effusion. Mitral Valve: The mitral valve is normal in structure. Mild mitral valve regurgitation. Tricuspid Valve: The tricuspid valve is normal in structure. Tricuspid valve regurgitation is mild. Aortic Valve: The aortic valve has been repaired/replaced. Aortic valve regurgitation is not visualized. There is a Sapien prosthetic, stented (TAVR) valve present in the aortic position. Echo findings are  consistent with normal structure and function of  the aortic valve prosthesis. Pulmonic Valve: The pulmonic valve was grossly normal. Pulmonic valve regurgitation is not visualized. Aorta:  The aortic root is normal in size and structure. IAS/Shunts: No atrial level shunt detected by color flow Doppler. Additional Comments: A device lead is visualized. Kate Sable MD Electronically signed by Kate Sable MD Signature Date/Time: 11/03/2021/2:39:53 PM    Final     Cardiac Studies   Echo 10/31/21  1. Left ventricular ejection fraction, by estimation, is 60 to 65%. The  left ventricle has normal function. The left ventricle has no regional  wall motion abnormalities. There is mild left ventricular hypertrophy.  Left ventricular diastolic parameters  are consistent with Grade I diastolic dysfunction (impaired relaxation).   2. Right ventricular systolic function is normal. The right ventricular  size is normal. Tricuspid regurgitation signal is inadequate for assessing  PA pressure.   3. Left atrial size was mildly dilated.   4. The mitral valve is normal in structure. No evidence of mitral valve  regurgitation. No evidence of mitral stenosis.   5. The aortic valve was not well visualized. Aortic valve regurgitation  is not visualized. No aortic stenosis is present. Echo findings are  consistent with normal structure and function of the aortic valve  prosthesis. Aortic valve mean gradient measures   6.5 mmHg.   Patient Profile     80 y.o. 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, recurrent MSSA bacteremia, anemia, SNHL status post cochlear implant, GERD, obesity status post gastric bypass in  08/2012, bipolar disorder, OSA, falls, and OA  who is being seen today for the evaluation of elevated troponin   Assessment & Plan    Recurrent MSSA bacteremia/Sepsis - third admission for bacteremia, strong suspicion for endocarditis from pacemaker - TEE 05/2021 showed a small structure in the right atrium attached to the pacemaker lead - TEE showed no endocarditis -  EP consulted and plan for transfer to [facility] for device extraction - abx per IM   CAD s/p remote PCI Elevated troponin - HS trop elevated up to 286 - No anginal symptoms - No indication for IV heparin - Echo showed preserved LVEF - elevated troponin likely demand ischemia in the setting of CAD with MSSA bacteremia, hypotension, renal dysfunction, anemia - no plans for ischemic evaluation at this time - continue ASA, Coreg - Plavix and subq heparin held for thrombocytopenia, continue to monitor - statin held for elevated LFTS>>restart when able   Aortic stenosis s/p TAVR - echo showed normal structure and function of valve - TEE with no endocarditis   SSS s/p PPM - plan for device extraction - per EP  For questions or updates, please contact [place] [facility] Please consult [email or web address] for contact info under        Signed, Douglas Rooks Ninfa Meeker, PA-C  11/08/2021, 12:32 PM

## 2021-11-04 NOTE — Progress Notes (Signed)
Pharmacy Antibiotic Note  Nathan Singleton is a 80 y.o. male with past medical history of hypertension, diabetes, CAD, TAVR, pacemaker, CKD, and peripheral vascular disease who presents to the ED for abnormal labs. Pt was seen in ED 11/17 d/t fever and 1 week of worsening arm pain w/ skin tears on forearms. Pt was sent home on Keflex. Since then, pt blood cultures from 11/17 came back with 4/4 bottles positive for MSSA and pt was called back into ED for reevaluation. Pharmacy has been consulted for cefazolin dosing bacteremia.  TEE- no left atrial/left atrial appendage thrombus detected. On day 5 of antibiotics. Afebrile, WBC WNL. Scr 1.59 c/w BL.   Pt is s/p tx from [facility] for pacer removal here. Plan to continue cefazolin and will require likely lifelong suppression therapy   Plan: Continue cefazolin 2 grams IV q8h      Temp (24hrs), Avg:99.5 F (37.5 C), Min:98.4 F (36.9 C), Max:101.3 F (38.5 C)  Recent Labs  Lab 10/30/21 1001 10/30/21 1003 10/30/21 1003 10/31/21 1011 10/31/21 1013 11/01/21 0736 11/02/21 0553 11/03/21 0536 11/01/2021 1121  WBC  --  5.4   < >  --  6.7 4.5 3.7* 4.2 4.0  CREATININE  --  1.42*  --   --  1.88* 1.77* 1.64* 1.59*  --   LATICACIDVEN 1.1  --   --  1.3  1.3  --   --   --   --   --    < > = values in this interval not displayed.     Estimated Creatinine Clearance: 46.3 mL/min (A) (by C-G formula based on SCr of 1.59 mg/dL (H)).    Allergies  Allergen Reactions   Atorvastatin Other (See Comments)    "bad for kidneys" Cramping    Celecoxib Other (See Comments)    Other reaction(s): Other (See Comments) Kidney Problem Kidney Problem    Dicyclomine Hcl Other (See Comments)    Stomach cramps   Doxycycline Other (See Comments)    Per wife (per RN)- pt gets severe stomach pains even when takes with food and PCP said to not take     Metformin Other (See Comments)    Antimicrobials this admission: 11/18 cefazolin >>    Microbiology  results: 11/17 Bcx: MSSA 4/4 11/18 Bcx: MSSA 4/4 11/22 blood>>pending  Onnie Boer, PharmD, BCIDP, AAHIVP, CPP Infectious Disease Pharmacist 10/16/2021 8:39 PM

## 2021-11-04 NOTE — TOC Progression Note (Signed)
Transition of Care [facility]) - Progression Note    Patient Details  Name: Nathan Singleton MRN: [id number] Date of Birth: 06-Apr-1941  Transition of Care [facility]) CM/SW [place], [place] Phone Number: 11/05/2021, 9:06 AM  Clinical Narrative:     CSW spoke with patient's significant other [name], she reports she would not like to accept [facility] bed at this time as this is a drive for her. Reports patient will be transferred to [facility] for procedure and she wishes to see how he does after. She reports patient is also denying wanting to go to SNF, and would prefer outpatient PT or home health therapies.   TOC will continue to follow for discharge planning following procedure.     Expected Discharge Plan: [place] Barriers to Discharge: Continued Medical Work up, [place] Forensic scientist)  Expected Discharge Plan and Services Expected Discharge Plan: [place] Choice: [place] Living arrangements for the past 2 months: Single Family Home                                       Social Determinants of Health (SDOH) Interventions    Readmission Risk Interventions Readmission Risk Prevention Plan 11/02/2021 05/07/2021 03/23/2021  Transportation Screening - Complete Complete  Medication Review Press photographer) - Complete Complete  PCP or Specialist appointment within 3-5 days of discharge Complete Complete Complete  HRI or [place] - Complete Complete  SW Recovery Care/Counseling Consult Complete Complete Complete  Palliative Care Screening Not Applicable Not Applicable Not [place] Complete Not Applicable Not Applicable

## 2021-11-04 NOTE — Evaluation (Signed)
Occupational Therapy Evaluation Patient Details Name: Nathan Singleton MRN: [id number] DOB: 12/24/1940 Today's Date: 11/09/2021   History of Present Illness Pt admitted to [facility] on 10/31/21 for abnormal labs dx with sepsis. Pt with recent ED admission 11/17 for fever and 1 week worsening arm pain following fall with skin tears to bil forearms; pt DC home with Keflex to cover for cellulitis. Lab work up was positive for MSSA and was called to return to ED. 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 Elevated troponin likely due to demand ischemia in setting of CAD with MSSA bacteremia, hypotension, renal dysfunction, and anemia. Significant PMH includes: HTN, diabetes, CAD, CKD, and PVD.   Clinical Impression   Mr. Heilman presents today with generalized weakness, limited endurance, fatigue, and 8/10 pain. He reports feeling comfortable only in supine; pain spikes with movement and in sitting or standing. Pt and spouse report that pt was IND in ADL prior to hospitalization; today he requires Mod A for seated ADL tasks and fxl mobility and cannot maintain sitting or standing balance for > 2 mins. Recommend ongoing OT services while pt is hospitalized, to support return to PLOF. Pt to transfer today to [facility] for pacemaker removal. Given pt's recent decline in fxl mobility and potential to regain function post surgery, consider referral to CIR following transfer to [facility].     Recommendations for follow up therapy are one component of a multi-disciplinary discharge planning process, led by the attending physician.  Recommendations may be updated based on patient status, additional functional criteria and insurance authorization.   Follow Up Recommendations  Acute inpatient rehab (3hours/day)    Assistance Recommended at Discharge Intermittent Supervision/Assistance  Functional Status Assessment  Patient has had a recent decline in their  functional status and demonstrates the ability to make significant improvements in function in a reasonable and predictable amount of time.  Equipment Recommendations  None recommended by OT    Recommendations for Other Services       Precautions / Restrictions Precautions Precautions: Fall Restrictions Weight Bearing Restrictions: No      Mobility Bed Mobility Overal bed mobility: Needs Assistance Bed Mobility: Supine to Sit;Sit to Supine     Supine to sit: Mod assist Sit to supine: Mod assist   General bed mobility comments: Increased time and effort, intensified pain with movement, mod A for trunk and LE control    Transfers Overall transfer level: Needs assistance Equipment used: Rolling walker (2 wheels) Transfers: Sit to/from Stand Sit to Stand: Min assist           General transfer comment: extra time, effort, Min A to power into standing      Balance Overall balance assessment: Needs assistance Sitting-balance support: Feet supported;Bilateral upper extremity supported Sitting balance-Leahy Scale: Fair     Standing balance support: Bilateral upper extremity supported Standing balance-Leahy Scale: Fair Standing balance comment: with RW                           ADL either performed or assessed with clinical judgement   ADL Overall ADL's : Needs assistance/impaired                                       General ADL Comments: Mod A for UB and LB ADL     Vision  Perception     Praxis      Pertinent Vitals/Pain Pain Assessment: 0-10 Pain Score: 8  Pain Location: shoulders, upper back Pain Descriptors / Indicators: Aching;Discomfort;Sore (Pt reported crying earlier when trying to use bathroom) Pain Intervention(s): Limited activity within patient's tolerance;Repositioned;Monitored during session     Hand Dominance Right   Extremity/Trunk Assessment Upper Extremity Assessment Upper Extremity Assessment:  Overall WFL for tasks assessed   Lower Extremity Assessment Lower Extremity Assessment: Overall WFL for tasks assessed   Cervical / Trunk Assessment Cervical / Trunk Assessment: Kyphotic   Communication Communication Communication: HOH   Cognition Arousal/Alertness: Awake/alert Behavior During Therapy: WFL for tasks assessed/performed Overall Cognitive Status: Within Functional Limits for tasks assessed                                 General Comments: A&Ox4, able to follow 100% of simple 2-step commands; 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     General Comments  multiple bruises, skin tears on b/l UE and LE    Exercises Total Joint Exercises Ankle Circles/Pumps: AROM;Both;10 reps;Supine Quad Sets: AROM;Both;10 reps;Supine Long Arc Quad: AROM;Seated;Both;10 reps Marching in Standing: AROM;Both;5 reps;Standing Other Exercises Other Exercises: Pt education on rolling for bed mobility Other Exercises: Educ re: bed mobility, transfers, falls prevention, DC recs   Shoulder Instructions      Home Living Family/patient expects to be discharged to:: Private residence Living Arrangements: Spouse/significant other Available Help at Discharge: Family;Available 24 hours/day Type of Home: House Home Access: Stairs to enter [employer] of Steps: 2  -- currently working on Financial risk analyst: None Home Layout: One level     Bathroom Shower/Tub: Teacher, early years/pre: Standard     Home Equipment: Conservation officer, nature (2 wheels);Rollator (4 wheels);Cane - single point;BSC/3in1;Grab bars - tub/shower          Prior Functioning/Environment Prior Level of Function : Independent/Modified Independent;History of Falls (last six months)             Mobility Comments: Pt/fiancee report pt uses RW vs. SPC at home for limited ambulation. 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 ADLs Comments:  Pt/fiancee report pt was grossly mod I with ADL's, but required assist for washing back during sponge baths, and splitting IADL's.        OT Problem List: Impaired balance (sitting and/or standing);Decreased activity tolerance;Decreased strength;Pain      OT Treatment/Interventions:      OT Goals(Current goals can be found in the care plan section) Acute Rehab OT Goals Patient Stated Goal: to get back to good heatlh OT Goal Formulation: With patient/family Time For Goal Achievement: 11/18/21 Potential to Achieve Goals: Good  OT Frequency:     Barriers to D/C:            Co-evaluation              AM-PAC OT "6 Clicks" Daily Activity     Outcome Measure Help from another person eating meals?: None Help from another person taking care of personal grooming?: A Little Help from another person toileting, which includes using toliet, bedpan, or urinal?: A Little Help from another person bathing (including washing, rinsing, drying)?: A Little Help from another person to put on and taking off regular upper body clothing?: A Little Help from another person to put on and taking off regular lower body clothing?: A Little 6  Click Score: 19   End of Session Equipment Utilized During Treatment: Rolling walker (2 wheels)  Activity Tolerance: Patient tolerated treatment well Patient left: in bed;with call bell/phone within reach;with family/visitor present  OT Visit Diagnosis: Unsteadiness on feet (R26.81);Muscle weakness (generalized) (M62.81);Pain                Time: 0300-9233 OT Time Calculation (min): 30 min Charges:  OT General Charges $OT Visit: 1 Visit OT Evaluation $OT Eval Moderate Complexity: 1 Mod OT Treatments $Self Care/Home Management : 23-37 mins Josiah Lobo, PhD, MS, OTR/L 10/17/2021, 12:44 PM

## 2021-11-04 NOTE — Care Management Important Message (Signed)
Important Message  Patient Details  Name: Nathan Singleton MRN: [id number] Date of Birth: Sep 03, 1941   Medicare Important Message Given:  Other (see comment)  Transferring to [facility].  Medicare IM not given at this time as patient transferring to another acute care facility for continued care.     Dannette Barbara 11/01/2021, 1:50 PM

## 2021-11-04 NOTE — Progress Notes (Signed)
Patient picked up by [facility] for transfer to [facility].  Patient is transferring to [facility] 4E room 15. This RN placed a call x2 attempting to give report to RN.  Second call remained on hold for 10 minutes awaiting RN to pick up to receive report.  This RN gave report to Equities trader the charge RN for the unit.

## 2021-11-04 NOTE — Discharge Summary (Addendum)
Physician Discharge Summary   Patient name: Nathan Singleton  Admit date:     10/31/2021  Discharge date: 10/14/2021  Discharge Physician: Berle Mull   PCP: Ranae Plumber, PA   Recommendations at discharge:  [facility].  Recommend hematology consult in addition to ID consult.   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 [facility]   80 y.o. 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  11/19: TEE planned for 11/21, on IV antibiotics.  Platelets trending down.  Holding Plavix and subcu heparin.  Continue aspirin and start SCDs for DVT prophylaxis 11/20: TEE tomorrow.  PT recommends SNF.  TOC aware 11/21: TEE negative 11/22: transferring the [facility] for EP consult and pacemaker extraction.    * Sepsis ([place]) Present on admission secondary to MSSA bacteremia. Met SIRS criteria on admission. Continue IV antibiotic as per ID.  Due to recurrent nature of MSSA bacteremia, patient will require pacemaker removed.   MSSA bacteremia Continue cefazolin.  TEE negative for endocarditis. He will need long-term IV antibiotics.  He also has cochlear implant  Pacemaker St Jude device ID is concerned  about possible pacemaker lead infection   Thrombocytopenia ([place]) Dropping platelets so holding Plavix and subcu heparin.  Continue aspirin.  Platelets remained stable.  plt count 102 on admission. In 45s since last 3 days.  Started to drop on second day of admission.  INR mildly elevated and fibrinogen 258. Mild AKI. Pt will benefit from hematology evaluation at [facility].    Coronary artery disease involving coronary bypass graft of native heart with angina pectoris ([place]) Echo showing preserved LV systolic function.  No plan for ischemic work-up while in the hospital.  Cardiology following.  Holding Plavix due to dropping platelets.  Continue aspirin  Antibiotic-associated diarrhea Reported 5BM in last 24 hours, no blood. No abdominal pain, no nausea or vomiting. No tenderness on exam.  Mostly associated with Antibiotics and lactulose+miralax.  Will stop stool softeners and add probiotics.   Acute renal failure superimposed on stage 3a chronic kidney disease (HCC) Baseline serum creatinine 1.1-1.2. On presentation serum creatinine 1.59.  Meeting AKI criteria. Currently renal function stable.  Monitor.  Chronic kidney disease, stage 3a ([place]) Close to baseline.  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  Anemia of chronic disease Hemoglobin stable monitor closely.  No obvious bleeding  Procedures performed: Echocardiogram TEE   Condition at discharge: good  Exam General: Appear in mild distress, no Rash; Oral Mucosa Clear, moist. no Abnormal Neck Mass Or lumps, Conjunctiva normal  Cardiovascular: S1 and S2 Present, aortic systolic  Murmur, Respiratory: good respiratory effort,  Bilateral Air entry present and CTA, no Crackles, no wheezes Abdomen: Bowel Sound present, Soft and  no tenderness Extremities: no Pedal edema Neurology: alert and oriented to time, place, and person affect appropriate. no new focal deficit Gait not checked due to patient safety concerns      Disposition: outside hospital  Discharge time: greater than 30 minutes.   Allergies as of 10/17/2021       Reactions   Atorvastatin Other (See Comments)   "bad for kidneys" Cramping   Celecoxib Other (See Comments)   Other reaction(s): Other (See Comments) Kidney Problem Kidney Problem   Dicyclomine Hcl Other (See Comments)   Stomach cramps   Doxycycline Other (See Comments)   Per wife (per RN)- pt gets severe stomach pains even when takes with food and PCP said to not take   Metformin Other (See Comments)        Medication List     STOP taking these medications    amLODipine 10 MG tablet Commonly known as: NORVASC   cephALEXin 500 MG capsule Commonly known as: KEFLEX   clopidogrel 75 MG tablet Commonly known as: PLAVIX   isosorbide mononitrate 30 MG 24 hr tablet Commonly known as: IMDUR   Linzess 145 MCG Caps capsule Generic drug: linaclotide       TAKE these medications    acetaminophen 325 MG tablet Commonly known as: TYLENOL Take 2 tablets (650 mg total) by mouth every 6 (six) hours as needed for fever, headache or moderate pain.   albuterol 108 (90 Base) MCG/ACT inhaler Commonly known as: VENTOLIN HFA Inhale 2 puffs into the lungs every 4 (four) hours as needed for wheezing or shortness of breath.   allopurinol 100 MG tablet Commonly known as: ZYLOPRIM Take 100 mg by mouth daily.   aspirin 81 MG EC tablet Take 81 mg by mouth daily.   budesonide-formoterol 80-4.5 MCG/ACT inhaler Commonly known as: SYMBICORT Inhale 2 puffs into the lungs 2 (two) times daily.   Calcium Citrate+D3 Petites 200-6.25 MG-MCG Tabs Generic drug: Calcium Citrate-Vitamin D Take 1 tablet by mouth as directed.   carvedilol 6.25 MG tablet Commonly known as: COREG Take 6.25  mg by mouth 2 (two) times daily.   ceFAZolin 2-4 GM/100ML-% IVPB Commonly known as: ANCEF Inject 100 mLs (2 g total) into the vein every 8 (eight) hours.   gabapentin 100 MG capsule Commonly known as: NEURONTIN Take 100 mg by mouth daily.   insulin glargine-yfgn 100 UNIT/ML Pen Commonly known as: SEMGLEE Inject 22 Units into the skin at bedtime.   lactulose (encephalopathy) 10 GM/15ML Soln Commonly known as: CHRONULAC Take 15 mLs by mouth 2 (two) times daily.   liraglutide 18 MG/3ML Sopn Commonly known as: VICTOZA Inject 1.8 mg into the skin daily.   loperamide 2 MG capsule Commonly known as: IMODIUM Take 1 capsule (2 mg total) by mouth as needed for diarrhea or loose stools.   multivitamin Tabs tablet Take 1 tablet by mouth daily.   pantoprazole 40 MG tablet Commonly known as: PROTONIX Take 1 tablet (40 mg total) by mouth daily.   rosuvastatin 40 MG tablet Commonly known as: CRESTOR Take 40 mg by mouth daily.   saccharomyces boulardii 250 MG capsule Commonly known as: Florastor Take 1 capsule (250 mg total) by mouth 2 (two) times daily.   sertraline 100 MG tablet Commonly known as: ZOLOFT Take 200 mg by mouth daily.   sucralfate 1 g tablet Commonly known as: CARAFATE Take 1 g by mouth 4 (  four) times daily.   tamsulosin 0.4 MG Caps capsule Commonly known as: FLOMAX Take 0.4 mg by mouth daily.   Vitamin D3 125 MCG (5000 UT) Caps Take 5,000 Units by mouth daily.        DG Chest 2 View  Result Date: 10/30/2021 CLINICAL DATA:  Shortness of breath, fever EXAM: CHEST - 2 VIEW COMPARISON:  None. FINDINGS: The heart size and mediastinal contours are within normal limits. A prosthetic aortic valve is noted. Pacemaker leads terminate in the right atrium and right ventricle. Low lung volumes without evidence of focal consolidation or pleural effusion. The visualized skeletal structures are unremarkable. IMPRESSION: No active cardiopulmonary disease. Electronically  Signed   By: Keane Police D.O.   On: 10/30/2021 10:59   DG Chest 2 View  Result Date: 10/17/2021 CLINICAL DATA:  Weakness, shortness of breath EXAM: CHEST - 2 VIEW COMPARISON:  05/23/2021 FINDINGS: Lungs are clear.  No pleural effusion or pneumothorax. The heart is top-normal in size. Left subclavian pacemaker. Status post TAVR. Degenerative changes of the visualized thoracolumbar spine. IMPRESSION: Normal chest radiographs. Electronically Signed   By: Julian Hy M.D.   On: 10/17/2021 03:38   CT HEAD WO CONTRAST (5MM)  Result Date: 10/17/2021 CLINICAL DATA:  Dizziness.  Ringing in ears.  Weakness. EXAM: CT HEAD WITHOUT CONTRAST TECHNIQUE: Contiguous axial images were obtained from the base of the skull through the vertex without intravenous contrast. COMPARISON:  05/25/2021 FINDINGS: Brain: No evidence of acute infarction, hemorrhage, hydrocephalus, extra-axial collection or mass lesion/mass effect. There is mild diffuse low-attenuation within the subcortical and periventricular white matter compatible with chronic microvascular disease. Prominence of the sulci and ventricles compatible with brain atrophy. Vascular: No hyperdense vessel or unexpected calcification. Skull: Normal. Negative for fracture or focal lesion. 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 Sinuses/Orbits: No acute finding. Other: None. IMPRESSION: 1. No acute intracranial abnormalities. 2. Chronic small vessel ischemic disease and brain atrophy. Electronically Signed   By: Kerby Moors M.D.   On: 10/17/2021 11:43   CT ABDOMEN PELVIS W CONTRAST  Result Date: 10/30/2021 CLINICAL DATA:  Abdominal pain, fever EXAM: CT ABDOMEN AND PELVIS WITH CONTRAST TECHNIQUE: Multidetector CT imaging of the abdomen and pelvis was performed using the standard protocol following bolus administration of intravenous contrast. CONTRAST:  131m OMNIPAQUE IOHEXOL 300 MG/ML  SOLN COMPARISON:  None.  FINDINGS: Lower chest: No acute abnormality. Hepatobiliary: Multiple small calcified granulomas. Post cholecystectomy. No unexpected biliary dilatation. Pancreas: Atrophic but otherwise unremarkable. Spleen: Unremarkable. Adrenals/Urinary Tract: Adrenals are unremarkable. Too small to characterize low-density right renal lesions. Bladder is unremarkable. Stomach/Bowel: There is evidence of prior bariatric surgery. Bowel is normal in caliber. Normal appendix. Vascular/Lymphatic: Atherosclerotic calcifications. No enlarged lymph nodes. Reproductive: Prostate is unremarkable. Other: No free fluid.  No acute abnormality of the abdominal wall. Musculoskeletal: Advanced degenerative changes of the lumbar spine. IMPRESSION: No acute abnormality. Electronically Signed   By: PMacy MisM.D.   On: 10/30/2021 12:56   DG Chest Port 1 View  Result Date: 10/31/2021 CLINICAL DATA:  Concern for sepsis, fever EXAM: PORTABLE CHEST 1 VIEW COMPARISON:  10/30/2021 FINDINGS: Normal heart size and vascularity. Previous TAVR and left subclavian pacemaker noted. No acute airspace process, collapse or consolidation. Negative for edema, effusion or pneumothorax. IMPRESSION: Stable postoperative findings. No interval change or acute process by plain radiography. Electronically Signed   By: MJerilynn Mages  Shick M.D.   On: 10/31/2021 10:57   ECHOCARDIOGRAM COMPLETE  Result  Date: 10/31/2021    ECHOCARDIOGRAM REPORT   Patient Name:   Nathan Singleton Date of Exam: 10/31/2021 Medical Rec #:  [id number]      Height:       76.0 in Accession #:    [id number]     Weight:       219.4 lb Date of Birth:  20-Dec-1940      BSA:          2.304 m Patient Age:    27 years       BP:           157/90 mmHg Patient Gender: M              HR:           108 bpm. Exam Location:  [facility] Procedure: 2D Echo, Cardiac Doppler and Color Doppler Indications:     NSTEMI I21.4  History:         Patient has prior history of Echocardiogram examinations, most                   recent 06/04/2021. CAD; Risk Factors:Diabetes and Dyslipidemia.                  S/P TAVR.  Sonographer:     Sherrie Sport Referring Phys:  [id number] Lequita Halt Diagnosing Phys: Kathlyn Sacramento MD  Sonographer Comments: Technically challenging study due to limited acoustic windows, no parasternal window, no subcostal window and suboptimal apical window. IMPRESSIONS  1. Left ventricular ejection fraction, by estimation, is 60 to 65%. The left ventricle has normal function. The left ventricle has no regional wall motion abnormalities. There is mild left ventricular hypertrophy. Left ventricular diastolic parameters are consistent with Grade I diastolic dysfunction (impaired relaxation).  2. Right ventricular systolic function is normal. The right ventricular size is normal. Tricuspid regurgitation signal is inadequate for assessing PA pressure.  3. Left atrial size was mildly dilated.  4. The mitral valve is normal in structure. No evidence of mitral valve regurgitation. No evidence of mitral stenosis.  5. The aortic valve was not well visualized. Aortic valve regurgitation is not visualized. No aortic stenosis is present. Echo findings are consistent with normal structure and function of the aortic valve prosthesis. Aortic valve mean gradient measures  6.5 mmHg. FINDINGS  Left Ventricle: Left ventricular ejection fraction, by estimation, is 60 to 65%. The left ventricle has normal function. The left ventricle has no regional wall motion abnormalities. The left ventricular internal cavity size was normal in size. There is  mild left ventricular hypertrophy. Left ventricular diastolic parameters are consistent with Grade I diastolic dysfunction (impaired relaxation). Right Ventricle: The right ventricular size is normal. No increase in right ventricular wall thickness. Right ventricular systolic function is normal. Tricuspid regurgitation signal is inadequate for assessing PA pressure. Left Atrium: Left atrial size was  mildly dilated. Right Atrium: Right atrial size was normal in size. Pericardium: There is no evidence of pericardial effusion. Mitral Valve: The mitral valve is normal in structure. No evidence of mitral valve regurgitation. No evidence of mitral valve stenosis. MV peak gradient, 7.0 mmHg. The mean mitral valve gradient is 3.0 mmHg. Tricuspid Valve: The tricuspid valve is normal in structure. Tricuspid valve regurgitation is not demonstrated. No evidence of tricuspid stenosis. Aortic Valve: The aortic valve was not well visualized. Aortic valve regurgitation is not visualized. No aortic stenosis is present. Aortic valve mean gradient measures 6.5 mmHg. Aortic valve peak gradient measures 9.5 mmHg. Aortic  valve area, by VTI measures 2.72 cm. Echo findings are consistent with normal structure and function of the aortic valve prosthesis. Pulmonic Valve: The pulmonic valve was normal in structure. Pulmonic valve regurgitation is not visualized. No evidence of pulmonic stenosis. Aorta: The aortic root is normal in size and structure. Venous: The inferior vena cava was not well visualized. IAS/Shunts: No atrial level shunt detected by color flow Doppler.  LEFT VENTRICLE PLAX 2D LVIDd:         3.90 cm   Diastology LVIDs:         2.80 cm   LV e' medial:    5.22 cm/s LV PW:         1.10 cm   LV E/e' medial:  19.1 LV IVS:        1.30 cm   LV e' lateral:   9.46 cm/s LVOT diam:     2.00 cm   LV E/e' lateral: 10.5 LV SV:         64 LV SV Index:   28 LVOT Area:     3.14 cm  RIGHT VENTRICLE RV S prime:     14.90 cm/s TAPSE (M-mode): 3.9 cm LEFT ATRIUM             Index        RIGHT ATRIUM           Index LA diam:        3.30 cm 1.43 cm/m   RA Area:     21.80 cm LA Vol (A2C):   40.4 ml 17.54 ml/m  RA Volume:   69.00 ml  29.95 ml/m LA Vol (A4C):   53.8 ml 23.35 ml/m LA Biplane Vol: 49.2 ml 21.35 ml/m  AORTIC VALVE AV Area (Vmax):    1.97 cm AV Area (Vmean):   1.83 cm AV Area (VTI):     2.72 cm AV Vmax:           154.00  cm/s AV Vmean:          114.050 cm/s AV VTI:            0.236 m AV Peak Grad:      9.5 mmHg AV Mean Grad:      6.5 mmHg LVOT Vmax:         96.60 cm/s LVOT Vmean:        66.600 cm/s LVOT VTI:          0.205 m LVOT/AV VTI ratio: 0.87  AORTA Ao Root diam: 3.50 cm MITRAL VALVE                TRICUSPID VALVE MV Area (PHT): 5.54 cm     TR Peak grad:   10.8 mmHg MV Area VTI:   2.95 cm     TR Vmax:        164.00 cm/s MV Peak grad:  7.0 mmHg MV Mean grad:  3.0 mmHg     SHUNTS MV Vmax:       1.32 m/s     Systemic VTI:  0.20 m MV Vmean:      83.4 cm/s    Systemic Diam: 2.00 cm MV Decel Time: 137 msec MV E velocity: 99.80 cm/s MV A velocity: 132.00 cm/s MV E/A ratio:  0.76 Kathlyn Sacramento MD Electronically signed by Kathlyn Sacramento MD Signature Date/Time: 10/31/2021/2:34:03 PM    Final    ECHO TEE  Result Date: 11/03/2021    TRANSESOPHOGEAL ECHO REPORT   Patient Name:  Nathan Singleton Date of Exam: 11/03/2021 Medical Rec #:  [id number]      Height:       76.0 in Accession #:    [id number]     Weight:       219.4 lb Date of Birth:  1941/09/30      BSA:          2.304 m Patient Age:    26 years       BP:           130/62 mmHg Patient Gender: M              HR:           64 bpm. Exam Location:  [facility] Procedure: Transesophageal Echo, Cardiac Doppler and Color Doppler Indications:     Bacteremia R783.81  History:         Patient has prior history of Echocardiogram examinations, most                  recent 10/31/2021. CAD, Pacemaker; Risk Factors:Hypertension                  and Diabetes. S/P TAVR.  Sonographer:     Sherrie Sport Referring Phys:  [id number] [place] Diagnosing Phys: Kate Sable MD PROCEDURE: The transesophogeal probe was passed without difficulty through the esophogus of the patient. Sedation performed by performing physician. The patient developed no complications during the procedure. IMPRESSIONS  1. Left ventricular ejection fraction, by estimation, is 55 to 60%. The left ventricle has normal function.  2.  Right ventricular systolic function is normal. The right ventricular size is normal.  3. No left atrial/left atrial appendage thrombus was detected.  4. The mitral valve is normal in structure. Mild mitral valve regurgitation.  5. The aortic valve has been repaired/replaced. Aortic valve regurgitation is not visualized. Echo findings are consistent with normal structure and function of the aortic valve prosthesis. Conclusion(s)/Recommendation(s): No evidence of vegetation/infective endocarditis on this transesophageael echocardiogram. FINDINGS  Left Ventricle: Left ventricular ejection fraction, by estimation, is 55 to 60%. The left ventricle has normal function. The left ventricular internal cavity size was normal in size. Right Ventricle: The right ventricular size is normal. No increase in right ventricular wall thickness. Right ventricular systolic function is normal. Left Atrium: Left atrial size was normal in size. No left atrial/left atrial appendage thrombus was detected. Right Atrium: Right atrial size was normal in size. Pericardium: There is no evidence of pericardial effusion. Mitral Valve: The mitral valve is normal in structure. Mild mitral valve regurgitation. Tricuspid Valve: The tricuspid valve is normal in structure. Tricuspid valve regurgitation is mild. Aortic Valve: The aortic valve has been repaired/replaced. Aortic valve regurgitation is not visualized. There is a Sapien prosthetic, stented (TAVR) valve present in the aortic position. Echo findings are consistent with normal structure and function of  the aortic valve prosthesis. Pulmonic Valve: The pulmonic valve was grossly normal. Pulmonic valve regurgitation is not visualized. Aorta: The aortic root is normal in size and structure. IAS/Shunts: No atrial level shunt detected by color flow Doppler. Additional Comments: A device lead is visualized. Kate Sable MD Electronically signed by Kate Sable MD Signature Date/Time:  11/03/2021/2:39:53 PM    Final    Results for orders placed or performed during the hospital encounter of 10/31/21  Urine Culture     Status: Abnormal   Collection Time: 10/31/21 10:00 AM   Specimen: Urine, Random  Result Value Ref Range Status  Specimen Description   Final    URINE, RANDOM Performed at [facility], [address], [place], [place] [address]    Special Requests   Final    NONE Performed at [facility], [address] [place], [place] [address]    Culture (A)  Final    <10,000 COLONIES/mL INSIGNIFICANT GROWTH Performed at [facility] [address], [place], [place] [address]    Report Status 11/02/2021 FINAL  Final  Resp Panel by RT-PCR (Flu A&B, Covid) Nasopharyngeal Swab     Status: None   Collection Time: 10/31/21 10:11 AM   Specimen: Nasopharyngeal Swab; Nasopharyngeal(NP) swabs in vial transport medium  Result Value Ref Range Status   SARS Coronavirus 2 by RT PCR NEGATIVE NEGATIVE Final    Comment: (NOTE) SARS-CoV-2 target nucleic acids are NOT DETECTED.  The SARS-CoV-2 RNA is generally detectable in upper respiratory specimens during the acute phase of infection. The lowest concentration of SARS-CoV-2 viral copies this assay can detect is 138 copies/mL. A negative result does not preclude SARS-Cov-2 infection and should not be used as the sole basis for treatment or other patient management decisions. A negative result may occur with  improper specimen collection/handling, submission of specimen other than nasopharyngeal swab, presence of viral mutation(s) within the areas targeted by this assay, and inadequate number of viral copies(<138 copies/mL). A negative result must be combined with clinical observations, patient history, and epidemiological information. The expected result is Negative.  Fact Sheet for Patients:  EntrepreneurPulse.com.au  Fact Sheet for Healthcare Providers:   IncredibleEmployment.be  This test is no t yet approved or cleared by the [place] FDA and  has been authorized for detection and/or diagnosis of SARS-CoV-2 by FDA under an Emergency Use Authorization (EUA). This EUA will remain  in effect (meaning this test can be used) for the duration of the COVID-19 declaration under Section 564(b)(1) of the Act, 21 U.S.C.section 360bbb-3(b)(1), unless the authorization is terminated  or revoked sooner.       Influenza A by PCR NEGATIVE NEGATIVE Final   Influenza B by PCR NEGATIVE NEGATIVE Final    Comment: (NOTE) The Xpert Xpress SARS-CoV-2/FLU/RSV plus assay is intended as an aid in the diagnosis of influenza from Nasopharyngeal swab specimens and should not be used as a sole basis for treatment. Nasal washings and aspirates are unacceptable for Xpert Xpress SARS-CoV-2/FLU/RSV testing.  Fact Sheet for Patients: EntrepreneurPulse.com.au  Fact Sheet for Healthcare Providers: IncredibleEmployment.be  This test is not yet approved or cleared by the [place] FDA and has been authorized for detection and/or diagnosis of SARS-CoV-2 by FDA under an Emergency Use Authorization (EUA). This EUA will remain in effect (meaning this test can be used) for the duration of the COVID-19 declaration under Section 564(b)(1) of the Act, 21 U.S.C. section 360bbb-3(b)(1), unless the authorization is terminated or revoked.  Performed at [facility], [address] [place], [place] [address]   Blood Culture (routine x 2)     Status: Abnormal   Collection Time: 10/31/21 10:13 AM   Specimen: BLOOD  Result Value Ref Range Status   Specimen Description   Final    BLOOD RIGHT WRIST Performed at [facility], [address], [place], [place] [address]    Special Requests   Final    BOTTLES DRAWN AEROBIC AND ANAEROBIC Blood Culture adequate volume Performed at [facility], [address], [place], [place] [address]    Culture  Setup Time  Final    GRAM POSITIVE COCCI IN BOTH AEROBIC AND ANAEROBIC BOTTLES CRITICAL RESULT CALLED TO, READ BACK BY AND VERIFIED WITH: SHEEMA HALLAGI AT 1900 ON 10/31/21 BY SS    Culture (A)  Final    STAPHYLOCOCCUS AUREUS SUSCEPTIBILITIES PERFORMED ON PREVIOUS CULTURE WITHIN THE LAST 5 DAYS. Performed at [facility], [place] [address], [place], [place] [address]    Report Status 11/02/2021 FINAL  Final  Blood Culture (routine x 2)     Status: Abnormal   Collection Time: 10/31/21 10:13 AM   Specimen: BLOOD  Result Value Ref Range Status   Specimen Description   Final    BLOOD LEFT WRIST Performed at [facility], [address] [place], [place] [address]    Special Requests   Final    BOTTLES DRAWN AEROBIC AND ANAEROBIC BCAV Performed at [facility], [address] [place], [place] [address]    Culture  Setup Time   Final    GRAM POSITIVE COCCI IN BOTH AEROBIC AND ANAEROBIC BOTTLES CRITICAL VALUE NOTED.  VALUE IS CONSISTENT WITH PREVIOUSLY REPORTED AND CALLED VALUE. Performed at [facility], [address] [place], [place] [address]    Culture (A)  Final    STAPHYLOCOCCUS AUREUS SUSCEPTIBILITIES PERFORMED ON PREVIOUS CULTURE WITHIN THE LAST 5 DAYS. Performed at [facility], [place] [address] [place], [place] [address]    Report Status 11/02/2021 FINAL  Final  Blood Culture ID Panel (Reflexed)     Status: Abnormal   Collection Time: 10/31/21 10:13 AM  Result Value Ref Range Status   Enterococcus faecalis NOT DETECTED NOT DETECTED Final   Enterococcus Faecium NOT DETECTED NOT DETECTED Final   Listeria monocytogenes NOT DETECTED NOT DETECTED Final   Staphylococcus species DETECTED (A) NOT DETECTED Final    Comment: CRITICAL RESULT CALLED TO, READ BACK BY AND VERIFIED WITH: SHEEMA HALLAGI AT 1900 ON 10/31/21 BY SS    Staphylococcus aureus (BCID) DETECTED  (A) NOT DETECTED Final    Comment: CRITICAL RESULT CALLED TO, READ BACK BY AND VERIFIED WITH: SHEEMA HALLAGI AT 1900 ON 10/31/21 BY SS    Staphylococcus epidermidis NOT DETECTED NOT DETECTED Final   Staphylococcus lugdunensis NOT DETECTED NOT DETECTED Final   Streptococcus species NOT DETECTED NOT DETECTED Final   Streptococcus agalactiae NOT DETECTED NOT DETECTED Final   Streptococcus pneumoniae NOT DETECTED NOT DETECTED Final   Streptococcus pyogenes NOT DETECTED NOT DETECTED Final   A.calcoaceticus-baumannii NOT DETECTED NOT DETECTED Final   Bacteroides fragilis NOT DETECTED NOT DETECTED Final   Enterobacterales NOT DETECTED NOT DETECTED Final   Enterobacter cloacae complex NOT DETECTED NOT DETECTED Final   Escherichia coli NOT DETECTED NOT DETECTED Final   Klebsiella aerogenes NOT DETECTED NOT DETECTED Final   Klebsiella oxytoca NOT DETECTED NOT DETECTED Final   Klebsiella pneumoniae NOT DETECTED NOT DETECTED Final   Proteus species NOT DETECTED NOT DETECTED Final   Salmonella species NOT DETECTED NOT DETECTED Final   Serratia marcescens NOT DETECTED NOT DETECTED Final   Haemophilus influenzae NOT DETECTED NOT DETECTED Final   Neisseria meningitidis NOT DETECTED NOT DETECTED Final   Pseudomonas aeruginosa NOT DETECTED NOT DETECTED Final   Stenotrophomonas maltophilia NOT DETECTED NOT DETECTED Final   Candida albicans NOT DETECTED NOT DETECTED Final   Candida auris NOT DETECTED NOT DETECTED Final   Candida glabrata NOT DETECTED NOT DETECTED Final   Candida krusei NOT DETECTED NOT DETECTED Final   Candida parapsilosis NOT DETECTED NOT DETECTED Final  Candida tropicalis NOT DETECTED NOT DETECTED Final   Cryptococcus neoformans/gattii NOT DETECTED NOT DETECTED Final   Meth resistant mecA/C and MREJ NOT DETECTED NOT DETECTED Final    Comment: Performed at [facility], [address] [place], [place] [address]  CULTURE, BLOOD (ROUTINE X 2) w Reflex to ID Panel     Status:  None (Preliminary result)   Collection Time: 10/20/2021  3:42 AM   Specimen: BLOOD  Result Value Ref Range Status   Specimen Description BLOOD LEFT HAND  Final   Special Requests   Final    BOTTLES DRAWN AEROBIC AND ANAEROBIC Blood Culture results may not be optimal due to an excessive volume of blood received in culture bottles   Culture   Final    NO GROWTH < 12 HOURS Performed at [facility], [address] [place], [place] [address]    Report Status PENDING  Incomplete  CULTURE, BLOOD (ROUTINE X 2) w Reflex to ID Panel     Status: None (Preliminary result)   Collection Time: 10/27/2021  3:48 AM   Specimen: BLOOD  Result Value Ref Range Status   Specimen Description BLOOD LEFT HAND  Final   Special Requests   Final    BOTTLES DRAWN AEROBIC AND ANAEROBIC Blood Culture results may not be optimal due to an excessive volume of blood received in culture bottles   Culture   Final    NO GROWTH < 12 HOURS Performed at [facility], [address] [place], [place] [address]    Report Status PENDING  Incomplete    Signed:  Berle Mull MD.  [facility] 11/03/2021, 11:18 AM

## 2021-11-04 NOTE — Progress Notes (Addendum)
Date of Admission:  10/28/2021   T  ID: Nathan Singleton is a 80 y.o. male  Active Problems:   * No active hospital problems. *    Subjective: Pt waiting to be transferred to [facility] for removal of pacemaker Stable Says he has no complaints Objective: Vital signs in last 24 hours: Temp:  [98.4 F (36.9 C)-100.1 F (37.8 C)] 99.7 F (37.6 C) (11/22 1523) Pulse Rate:  [67-76] 67 (11/22 1523) Resp:  [20-22] 20 (11/22 1523) BP: (131-143)/(63-66) 143/65 (11/22 1523) SpO2:  [98 %-100 %] 98 % (11/22 1523)  PHYSICAL EXAM:  General: Alert, cooperative, no distress, appears stated age. Hard of hearing Lungs: Clear to auscultation bilaterally. No Wheezing or Rhonchi. No rales. Heart: Regular rate and rhythm, no murmur, rub or gallop. pacemaker Abdomen: Soft, non-tender,not distended. Bowel sounds normal. No masses Extremities: atraumatic, no cyanosis. No edema. No clubbing Skin: No rashes or lesions. Or bruising Lymph: Cervical, supraclavicular normal. Neurologic: Grossly non-focal  Lab Results Recent Labs    11/02/21 0553 11/03/21 0536 11/12/2021 1121  WBC 3.7* 4.2 4.0  HGB 9.1* 8.6* 8.3*  HCT 27.2* 25.2* 23.9*  NA 132* 135  --   K 3.4* 3.3*  --   CL 104 104  --   CO2 23 22  --   BUN 47* 44*  --   CREATININE 1.64* 1.59*  --    Liver Panel Recent Labs    11/02/21 0553  PROT 5.7*  ALBUMIN 2.7*  AST 191*  ALT 75*  ALKPHOS 65  BILITOT 0.3   Microbiology: 10/30/21- BC- MSSA 10/31/21 BC MSSA Studies/Results: ECHO TEE  Result Date: 11/03/2021    TRANSESOPHOGEAL ECHO REPORT   Patient Name:   Nathan Singleton Date of Exam: 11/03/2021 Medical Rec #:  [id number]      Height:       76.0 in Accession #:    [id number]     Weight:       219.4 lb Date of Birth:  January 29, 1941      BSA:          2.304 m Patient Age:    89 years       BP:           130/62 mmHg Patient Gender: M              HR:           64 bpm. Exam Location:  [facility] Procedure: Transesophageal Echo, Cardiac Doppler and  Color Doppler Indications:     Bacteremia R783.81  History:         Patient has prior history of Echocardiogram examinations, most                  recent 10/31/2021. CAD, Pacemaker; Risk Factors:Hypertension                  and Diabetes. S/P TAVR.  Sonographer:     Sherrie Sport Referring Phys:  [id number] [place] Diagnosing Phys: Kate Sable MD PROCEDURE: The transesophogeal probe was passed without difficulty through the esophogus of the patient. Sedation performed by performing physician. The patient developed no complications during the procedure. IMPRESSIONS  1. Left ventricular ejection fraction, by estimation, is 55 to 60%. The left ventricle has normal function.  2. Right ventricular systolic function is normal. The right ventricular size is normal.  3. No left atrial/left atrial appendage thrombus was detected.  4. The mitral valve is normal in structure. Mild mitral valve  regurgitation.  5. The aortic valve has been repaired/replaced. Aortic valve regurgitation is not visualized. Echo findings are consistent with normal structure and function of the aortic valve prosthesis. Conclusion(s)/Recommendation(s): No evidence of vegetation/infective endocarditis on this transesophageael echocardiogram. FINDINGS  Left Ventricle: Left ventricular ejection fraction, by estimation, is 55 to 60%. The left ventricle has normal function. The left ventricular internal cavity size was normal in size. Right Ventricle: The right ventricular size is normal. No increase in right ventricular wall thickness. Right ventricular systolic function is normal. Left Atrium: Left atrial size was normal in size. No left atrial/left atrial appendage thrombus was detected. Right Atrium: Right atrial size was normal in size. Pericardium: There is no evidence of pericardial effusion. Mitral Valve: The mitral valve is normal in structure. Mild mitral valve regurgitation. Tricuspid Valve: The tricuspid valve is normal in structure.  Tricuspid valve regurgitation is mild. Aortic Valve: The aortic valve has been repaired/replaced. Aortic valve regurgitation is not visualized. There is a Sapien prosthetic, stented (TAVR) valve present in the aortic position. Echo findings are consistent with normal structure and function of  the aortic valve prosthesis. Pulmonic Valve: The pulmonic valve was grossly normal. Pulmonic valve regurgitation is not visualized. Aorta: The aortic root is normal in size and structure. IAS/Shunts: No atrial level shunt detected by color flow Doppler. Additional Comments: A device lead is visualized. 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  The previous 2 times it was associated with infection on the right foot.  He was treated both times with 6 weeks of IV antibiotics followed by oral antibiotics.  This time there is no clear source.  the pacemaker wire is likely infected.  TEE did not show any vegetation It still needs to be removed.  H/o TAVR This time the patient will need long term oral suppressive therapy after completion of 6 weeks of IV  Continue cefazolin. Repeat blood culture Seen by cardiologist.    CKD  Thrombocytopenia  Transaminitis- AST > ALT ?? Due to infection Could he have underlying cirrhosis  Anemia  Diabetes mellitus management as per primary team  Hypertension management as per primary team.  Peripheral vascular disease.   History of right toes amputation. _History of bilateral knee replacement  H/O bariatric surgery  Discussed the management with the patient and his wife . Awaiting transfer to [facility]

## 2021-11-04 NOTE — Progress Notes (Signed)
Physical Therapy Treatment Patient Details Name: Nathan Singleton MRN: [id number] DOB: [birth year] Today's Date: 10/20/2021   History of Present Illness Pt admitted to [facility] on 10/31/21 for abnormal labs dx with sepsis. Pt with recent ED admission 11/17 for fever and 1 week worsening arm pain following fall with skin tears to bil forearms; pt DC home with Keflex to cover for cellulitis. Lab work up was positive for MSSA and was called to return to ED. 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 Elevated troponin likely due to demand ischemia in setting of CAD with MSSA bacteremia, hypotension, renal dysfunction, and anemia. Significant PMH includes: HTN, diabetes, CAD, CKD, and PVD.   PT Comments    Pt was pleasant and motivated to participate during the session and put forth good effort throughout. Pt was able to complete all ther ex in bed and seated at EOB. Pt required mod assist for bed mobility due to LBP and shoulder pain. Pt required min guard in order to complete STS for safety. Pt was able to ambulate 2-3 feet until he reported feeling dizzy. Pt experienced one bout of LOB but PT was there with gait belt for min assist to recover. Upon sitting, pt's SpO2 and HR remained WNL and BP was 124/84. Pt will benefit from PT services in a SNF setting upon discharge to safely address deficits listed in patient problem list for decreased caregiver assistance and eventual return to PLOF.   Recommendations for follow up therapy are one component of a multi-disciplinary discharge planning process, led by the attending physician.  Recommendations may be updated based on patient status, additional functional criteria and insurance authorization.  Follow Up Recommendations  Skilled nursing-short term rehab (<3 hours/day)     Assistance Recommended at Discharge Intermittent Supervision/Assistance  Equipment Recommendations  BSC/3in1    Recommendations  for Other Services       Precautions / Restrictions Precautions Precautions: Fall Restrictions Weight Bearing Restrictions: No     Mobility  Bed Mobility Overal bed mobility: Needs Assistance Bed Mobility: Supine to Sit;Sit to Supine     Supine to sit: Mod assist Sit to supine: Mod assist   General bed mobility comments: Increased time and effort, intensified pain with movement, mod A for trunk and LE control    Transfers Overall transfer level: Needs assistance Equipment used: Rolling walker (2 wheels) Transfers: Sit to/from Stand Sit to Stand: Min assist           General transfer comment: extra time, effort, Min A to power into standing    Ambulation/Gait Ambulation/Gait assistance: Min assist Gait Distance (Feet): 5 Feet Assistive device: Rolling walker (2 wheels) Gait Pattern/deviations: Wide base of support;Step-through pattern;Decreased step length - right;Decreased step length - left;Decreased stride length Gait velocity: decreased     General Gait Details: Increased time and effort, pt reported feeling dizzy upon ambulation therefore turned around, pt experiecen one bout of LOB but PT was there with gait belt for assistance   Stairs             Wheelchair Mobility    Modified Rankin (Stroke Patients Only)       Balance Overall balance assessment: Needs assistance Sitting-balance support: Feet supported;Bilateral upper extremity supported Sitting balance-Leahy Scale: Fair     Standing balance support: Bilateral upper extremity supported Standing balance-Leahy Scale: Fair Standing balance comment: with RW  Cognition Arousal/Alertness: Awake/alert Behavior During Therapy: WFL for tasks assessed/performed Overall Cognitive Status: Within Functional Limits for tasks assessed                                 General Comments: A&Ox4, able to follow 100% of simple 2-step commands;  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        Exercises Total Joint Exercises Ankle Circles/Pumps: AROM;Both;10 reps;Supine Quad Sets: AROM;Both;10 reps;Supine Long Arc Quad: AROM;Seated;Both;10 reps Marching in Standing: AROM;Both;5 reps;Standing Other Exercises Other Exercises: Pt education on rolling for bed mobility Other Exercises: Educ re: bed mobility, transfers, falls prevention, DC recs    General Comments General comments (skin integrity, edema, etc.): multiple bruises, skin tears on b/l UE and LE      Pertinent Vitals/Pain Pain Assessment: 0-10 Pain Score: 8  Pain Location: shoulders, upper back Pain Descriptors / Indicators: Aching;Discomfort;Sore (Pt reported crying earlier when trying to use bathroom) Pain Intervention(s): Limited activity within patient's tolerance;Repositioned;Monitored during session    [place] expects to be discharged to:: Private residence Living Arrangements: Spouse/significant other Available Help at Discharge: Family;Available 24 hours/day Type of Home: House Home Access: Stairs to enter Entrance Stairs-Rails: None Entrance Stairs-Number of Steps: 2  -- currently working on installing ramp   Home Layout: One level Home Equipment: Conservation officer, nature (2 wheels);Rollator (4 wheels);Cane - single point;BSC/3in1;Grab bars - tub/shower      Prior Function            PT Goals (current goals can now be found in the care plan section) Acute Rehab PT Goals Patient Stated Goal: to go home PT Goal Formulation: With patient/family Time For Goal Achievement: 11/15/21 Potential to Achieve Goals: Good Progress towards PT goals: Progressing toward goals    Frequency    Min 2X/week      PT Plan Current plan remains appropriate    Co-evaluation              AM-PAC PT "6 Clicks" Mobility   Outcome Measure  Help needed turning from your back to your side while in a flat bed without using bedrails?: A  Little Help needed moving from lying on your back to sitting on the side of a flat bed without using bedrails?: A Lot Help needed moving to and from a bed to a chair (including a wheelchair)?: A Little Help needed standing up from a chair using your arms (e.g., wheelchair or bedside chair)?: A Little Help needed to walk in hospital room?: A Little Help needed climbing 3-5 steps with a railing? : A Lot 6 Click Score: 16    End of Session Equipment Utilized During Treatment: Gait belt Activity Tolerance: Other (comment) (Pt limited due to dizziness) Patient left: in bed;with call bell/phone within reach;with family/visitor present (with OT in the room) Nurse Communication: Mobility status (Nursing notified of dizziness with ambulation and BP upon sitting) PT Visit Diagnosis: Unsteadiness on feet (R26.81);History of falling (Z91.81);Difficulty in walking, not elsewhere classified (R26.2);Muscle weakness (generalized) (M62.81)     Time: 8182-9937 PT Time Calculation (min) (ACUTE ONLY): 30 min  Charges:  $Therapeutic Exercise: 8-22 mins $Therapeutic Activity: 8-22 mins                     Sheldon Silvan SPT 11/01/2021, 1:35 PM

## 2021-11-04 NOTE — Progress Notes (Signed)
Inpatient Diabetes Program Recommendations  AACE/ADA: New Consensus Statement on Inpatient Glycemic Control (2015)  Target Ranges:  Prepandial:   less than 140 mg/dL      Peak postprandial:   less than 180 mg/dL (1-2 hours)      Critically ill patients:  140 - 180 mg/dL   Lab Results  Component Value Date   GLUCAP 273 (H) 10/14/2021   HGBA1C 6.9 (H) 10/17/2021    Review of Glycemic Control  Latest Reference Range & Units 11/03/21 07:53 11/03/21 11:26 11/03/21 12:08 11/03/21 13:09 11/03/21 15:47 11/03/21 21:21 10/17/2021 08:19  Glucose-Capillary 70 - 99 mg/dL 259 (H) 182 (H) 183 (H) 162 (H) 166 (H) 296 (H) 273 (H)  (H): Data is abnormally high   Inpatient Diabetes Program Recommendations:    Semglee 22 units QHS (home dose)  Will continue to follow while inpatient.  Thank you, Reche Dixon, RN, BSN Diabetes Coordinator Inpatient Diabetes Program [phone] (team pager from 8a-5p)

## 2021-11-04 NOTE — H&P (Addendum)
History and Physical    Nathan Singleton TKZ:[id number] DOB: May 21, 1941 DOA: 10/29/2021  PCP: Ranae Plumber, PA   Patient coming from: [facility], had been home prior admission there on 10/31/21   Chief Complaint: MSSA bacteremia   HPI: Nathan Singleton is a pleasant 80 y.o. male with medical history significant for coronary artery disease, insulin-dependent diabetes mellitus, chronic kidney disease stage IIIa, AS s/p TAVR, SSS with pacemaker, and 2 episodes of MSSA bacteremia earlier this year attributed to right foot infection and managed with second toe amputation and 6 weeks IV cefazolin, now with his third episode of MSSA bacteremia and transferred from [facility] for pacemaker extraction.   [facility] Hospital Course: Patient was seen in the [facility] ED on 10/30/2021 with fevers, had blood cultures collected, and went home with Keflex.  He continued to have fever to 103 F at home that night, the cultures started growing gram-positive cocci, and he came back into the [facility] ED the following day where he had fluid responsive hypotension and-year-old was admitted.  ID provided consultation for the recurrent bacteremia which ended up being MSSA again, patient underwent TEE on 11/03/2021 with no evidence for endocarditis.  He was continued on IV cefazolin, transferred to [facility] for extraction of pacemaker per ID recommendation, and will need long-term suppressive therapy in light of his prosthetic AV.  He appears to have chronic intermittent thrombocytopenia, platelet count 70 on admission, then decreased to the 40 range where it remained stable for the 4 days leading up to transfer.  Plavix and pharmacologic VTE prophylaxis were held for this reason and he was continued on Singleton aspirin.  Troponin was elevated to the 200s on admission, he was evaluated by cardiology, had preserved EF on echo, and this was attributed to demand ischemia in the setting of sepsis with hypotension and renal  insufficiency.  Transaminases were elevated during the admission with normal alkaline phosphatase and normal bilirubin.  Statin was held and transaminases have been trending down.  He developed loose stools without abdominal pain or leukocytosis. This was felt to be related to antibiotics, laxatives were held, and probiotic started.   Review of Systems:  All other systems reviewed and apart from HPI, are negative.  Past Medical History:  Diagnosis Date   Aneurysm (arteriovenous) of coronary vessels    Arthritis    Ascending aortic aneurysm    CAD (coronary artery disease)    Diabetes mellitus without complication ([place])    Hard of hearing    History of GI bleed    Hyperlipidemia associated with type 2 diabetes mellitus ([place])    Hypertension associated with diabetes ([place])    Presence of permanent cardiac pacemaker    PVD (peripheral vascular disease) ([place])    S/P TAVR (transcatheter aortic valve replacement)     Past Surgical History:  Procedure Laterality Date   AMPUTATION TOE Right 03/20/2021   Procedure: AMPUTATION TOE;  Surgeon: Sharlotte Alamo, DPM;  Location: [facility] ORS;  Service: Podiatry;  Laterality: Right;   AMPUTATION TOE Right 06/03/2021   Procedure: AMPUTATION TOE-SECOND TOE;  Surgeon: [name], DPM;  Location: [facility] ORS;  Service: Podiatry;  Laterality: Right;   AMPUTATION TOE Right 07/26/2021   Procedure: AMPUTATION TOE-THIRD TOE;  Surgeon: [name], DPM;  Location: [facility] ORS;  Service: Podiatry;  Laterality: Right;   Ankle repaired Right    Carpal Tunnel repaired Left    CATARACT EXTRACTION Bilateral    COLONOSCOPY     Copillar implant  DG CHOLECYSTOGRAPHY GALL BLADDER ([place] HX)     GASTRIC BYPASS     heart valve replaced     IRRIGATION AND DEBRIDEMENT FOOT Right 02/18/2021   Procedure: IRRIGATION AND DEBRIDEMENT FOOT-Right Great Toe;  Surgeon: [name], DPM;  Location: [facility] ORS;  Service: Podiatry;  Laterality: Right;   LOWER EXTREMITY ANGIOGRAPHY  Right 02/17/2021   Procedure: Lower Extremity Angiography;  Surgeon: Algernon Huxley, MD;  Location: [place] [facility];  Service: Cardiovascular;  Laterality: Right;   LOWER EXTREMITY ANGIOGRAPHY Right 03/27/2021   Procedure: Lower Extremity Angiography;  Surgeon: Algernon Huxley, MD;  Location: [place] [facility];  Service: Cardiovascular;  Laterality: Right;   PACEMAKER GENERATOR CHANGE     REPLACEMENT TOTAL KNEE BILATERAL     RTC     STENT PLACE LEFT URETER ([place] HX) Right    Right Leg   TEE WITHOUT CARDIOVERSION N/A 03/26/2021   Procedure: TRANSESOPHAGEAL ECHOCARDIOGRAM (TEE);  Surgeon: Kate Sable, MD;  Location: [facility] ORS;  Service: Cardiovascular;  Laterality: N/A;   TEE WITHOUT CARDIOVERSION N/A 06/04/2021   Procedure: TRANSESOPHAGEAL ECHOCARDIOGRAM (TEE);  Surgeon: Kate Sable, MD;  Location: [facility] ORS;  Service: Cardiovascular;  Laterality: N/A;   TEE WITHOUT CARDIOVERSION N/A 11/03/2021   Procedure: TRANSESOPHAGEAL ECHOCARDIOGRAM (TEE);  Surgeon: Wellington [place], MD;  Location: [facility] ORS;  Service: Cardiovascular;  Laterality: N/A;   Toe nail removed Bilateral     Social History:   reports that he has quit smoking. He has never used smokeless tobacco. He reports that he does not currently use alcohol. He reports that he does not currently use drugs.  Allergies  Allergen Reactions   Atorvastatin Other (See Comments)    "bad for kidneys" Cramping    Celecoxib Other (See Comments)    Other reaction(s): Other (See Comments) Kidney Problem Kidney Problem    Dicyclomine Hcl Other (See Comments)    Stomach cramps   Doxycycline Other (See Comments)    Per wife (per RN)- pt gets severe stomach pains even when takes with food and PCP said to not take     Metformin Other (See Comments)    Family History  Problem Relation Age of Onset   Heart disease Mother    Alzheimer's disease Mother    Heart disease Father    Diabetes Father      Prior to Admission  medications   Medication Sig Start Date End Date Taking? Authorizing Provider  acetaminophen (TYLENOL) 325 MG tablet Take 2 tablets (650 mg total) by mouth every 6 (six) hours as needed for fever, headache or moderate pain. 05/09/21   Loletha Grayer, MD  albuterol (VENTOLIN HFA) 108 (90 Base) MCG/ACT inhaler Inhale 2 puffs into the lungs every 4 (four) hours as needed for wheezing or shortness of breath. 05/09/21   Loletha Grayer, MD  allopurinol (ZYLOPRIM) 100 MG tablet Take 100 mg by mouth daily. 10/19/20   [provider]  aspirin 81 MG EC tablet Take 81 mg by mouth daily.    [provider]  budesonide-formoterol (SYMBICORT) 80-4.5 MCG/ACT inhaler Inhale 2 puffs into the lungs 2 (two) times daily.    [provider]  CALCIUM CITRATE+D3 PETITES 200-250 MG-UNIT TABS Take 1 tablet by mouth as directed.    [provider]  carvedilol (COREG) 6.25 MG tablet Take 6.25 mg by mouth 2 (two) times daily. 07/08/21   [provider]  ceFAZolin (ANCEF) 2-4 GM/100ML-% IVPB Inject 100 mLs (2 g total) into the vein every  8 (eight) hours. 10/27/2021   Lavina Hamman, MD  Cholecalciferol (VITAMIN D3) 125 MCG (5000 UT) CAPS Take 5,000 Units by mouth daily. 05/09/21   [provider]  gabapentin (NEURONTIN) 100 MG capsule Take 100 mg by mouth daily. 01/29/21   [provider]  Insulin Glargine-yfgn 100 UNIT/ML SOPN Inject 22 Units into the skin at bedtime.    [provider]  lactulose, encephalopathy, (CHRONULAC) 10 GM/15ML SOLN Take 15 mLs by mouth 2 (two) times daily. 10/08/21   [provider]  liraglutide (VICTOZA) 18 MG/3ML SOPN Inject 1.8 mg into the skin daily.    [provider]  loperamide (IMODIUM) 2 MG capsule Take 1 capsule (2 mg total) by mouth as needed for diarrhea or loose stools. 03/31/21   Blake Divine, MD  multivitamin (ONE-A-DAY MEN'S) TABS tablet Take 1 tablet by mouth daily. 11/11/20   [provider]  pantoprazole (PROTONIX) 40 MG tablet Take 1 tablet (40 mg total) by mouth daily. 02/11/21   [name], MD  rosuvastatin (CRESTOR) 40 MG tablet Take 40 mg by mouth daily. 02/13/21   [provider]  saccharomyces boulardii (FLORASTOR) 250 MG capsule Take 1 capsule (250 mg total) by mouth 2 (two) times daily. 11/03/2021   Lavina Hamman, MD  sertraline (ZOLOFT) 100 MG tablet Take 200 mg by mouth daily. 11/11/20   [provider]  sucralfate (CARAFATE) 1 g tablet Take 1 g by mouth 4 (four) times daily. 10/08/21   [provider]  tamsulosin (FLOMAX) 0.4 MG CAPS capsule Take 0.4 mg by mouth daily. 10/07/21   [provider]    Physical Exam: Vitals:   11/09/2021 1909 10/24/2021 2005 10/25/2021 2111  BP: (!) 145/72 (!) 141/64   Pulse: 71 74   Resp: (!) 23 18   Temp: 99 F (37.2 C) (!) 101.3 F (38.5 C)   TempSrc: Oral Oral   SpO2: 99% 100% 98%    Constitutional: NAD, calm  Eyes: PERTLA, lids and conjunctivae normal ENMT: Mucous membranes are moist. Posterior pharynx clear of any exudate or lesions.   Neck: supple, no masses  Respiratory: no wheezing, no crackles. No accessory muscle use.  Cardiovascular: S1 & S2 heard, regular rate and rhythm. No extremity edema.  Abdomen: No distension, no tenderness, soft. Bowel sounds active.  Musculoskeletal: no clubbing / cyanosis. No joint deformity upper and lower extremities.   Skin: Crusted arm lesions, hyperpigmented LEs. Warm, dry, well-perfused. Neurologic: Gross hearing deficit, CN II-XII grossly intact otherwise. Sensation intact. Moving all extremities. Alert and oriented.  Psychiatric: Pleasant. Cooperative.    Labs and Imaging on Admission: I have personally reviewed following labs and imaging studies  CBC: Recent Labs  Lab 10/30/21 1003 10/31/21 1013 11/01/21 0736 11/02/21 0553 11/03/21 0536 11/12/2021 1121  WBC 5.4 6.7 4.5 3.7* 4.2 4.0  NEUTROABS 4.9 6.4  --   --   --  3.2  HGB  10.2* 9.7* 8.9* 9.1* 8.6* 8.3*  HCT 30.5* 28.3* 25.6* 27.2* 25.2* 23.9*  MCV 93.8 92.5 90.5 91.3 89.4 91.2  PLT 110* 70* 37* 41* 43* 42*   Basic Metabolic Panel: Recent Labs  Lab 10/30/21 1003 10/31/21 1013 11/01/21 0736 11/02/21 0553 11/03/21 0536  NA 134* 136 131* 132* 135  K 4.4 3.5 3.5 3.4* 3.3*  CL 104 106 102 104 104  CO2 24 21* $Remo'22 23 22  'OgwYU$ GLUCOSE 102* 120* 182* 108* 174*  BUN 33* 40* 50* 47* 44*  CREATININE 1.42* 1.88* 1.77* 1.64*  1.59*  CALCIUM 8.4* 7.3* 7.4* 7.8* 7.8*   GFR: Estimated Creatinine Clearance: 46.3 mL/min (A) (by C-G formula based on SCr of 1.59 mg/dL (H)). Liver Function Tests: Recent Labs  Lab 10/30/21 1003 10/31/21 1013 11/01/21 0736 11/02/21 0553  AST 53* 352* 219* 191*  ALT 38 231* 141* 75*  ALKPHOS 87 58 60 65  BILITOT 0.7 0.6 0.5 0.3  PROT 7.1 5.4* 5.4* 5.7*  ALBUMIN 3.8 2.9* 2.5* 2.7*   No results for input(s): LIPASE, AMYLASE in the last 168 hours. No results for input(s): AMMONIA in the last 168 hours. Coagulation Profile: Recent Labs  Lab 10/30/21 1003 10/31/21 1013 10/27/2021 1121  INR 1.1 1.5* 1.6*   Cardiac Enzymes: No results for input(s): CKTOTAL, CKMB, CKMBINDEX, TROPONINI in the last 168 hours. BNP (last 3 results) No results for input(s): PROBNP in the last 8760 hours. HbA1C: No results for input(s): HGBA1C in the last 72 hours. CBG: Recent Labs  Lab 10/28/2021 0819 10/23/2021 1156 11/06/2021 1522 11/07/2021 1641 10/25/2021 2112  GLUCAP 273* 267* 269* 202* 124*   Lipid Profile: No results for input(s): CHOL, HDL, LDLCALC, TRIG, CHOLHDL, LDLDIRECT in the last 72 hours. Thyroid Function Tests: No results for input(s): TSH, T4TOTAL, FREET4, T3FREE, THYROIDAB in the last 72 hours. Anemia Panel: Recent Labs    11/09/2021 1120  VITAMINB12 1,380*   Urine analysis:    Component Value Date/Time   COLORURINE YELLOW (A) 10/31/2021 1707   APPEARANCEUR HAZY (A) 10/31/2021 1707   LABSPEC 1.017 10/31/2021 1707   PHURINE 5.0  10/31/2021 1707   GLUCOSEU 50 (A) 10/31/2021 1707   HGBUR SMALL (A) 10/31/2021 1707   BILIRUBINUR NEGATIVE 10/31/2021 1707   [place] 10/31/2021 1707   PROTEINUR 100 (A) 10/31/2021 1707   NITRITE NEGATIVE 10/31/2021 1707   LEUKOCYTESUR NEGATIVE 10/31/2021 1707   Sepsis Labs: $RemoveBefo'[email or web address]'[id number]$ (procalcitonin:4,lacticidven:4) ) Recent Results (from the past 240 hour(s))  Culture, blood (Routine x 2)     Status: Abnormal   Collection Time: 10/30/21  9:51 AM   Specimen: BLOOD  Result Value Ref Range Status   Specimen Description   Final    BLOOD LEFT ANTECUBITAL Performed at [facility], [address], [place], [place] [address]    Special Requests   Final    BOTTLES DRAWN AEROBIC AND ANAEROBIC Blood Culture adequate volume Performed at [facility], [address] [place], [place] [address]    Culture  Setup Time   Final    GRAM POSITIVE COCCI IN BOTH AEROBIC AND ANAEROBIC BOTTLES CRITICAL VALUE NOTED.  VALUE IS CONSISTENT WITH PREVIOUSLY REPORTED AND CALLED VALUE. Performed at [facility], [address] [place], [place] [address]    Culture (A)  Final    STAPHYLOCOCCUS AUREUS SUSCEPTIBILITIES PERFORMED ON PREVIOUS CULTURE WITHIN THE LAST 5 DAYS. Performed at [facility], [place] [address] [place], [place] [address]    Report Status 11/02/2021 FINAL  Final  Culture, blood (Routine x 2)     Status: Abnormal   Collection Time: 10/30/21 10:02 AM   Specimen: BLOOD  Result Value Ref Range Status   Specimen Description   Final    BLOOD LEFT WRIST Performed at [facility], [address] [place], [place] [address]    Special Requests   Final    BOTTLES DRAWN AEROBIC AND ANAEROBIC Blood Culture results may not be optimal due to an excessive volume of blood received in culture bottles Performed at [facility], [address],  [place], [place] [address]    Culture  Setup Time   Final    GRAM POSITIVE COCCI IN BOTH  AEROBIC AND ANAEROBIC BOTTLES CRITICAL RESULT CALLED TO, READ BACK BY AND VERIFIED WITH: Leonides Schanz RN 7001 10/30/21 HNM Performed at [facility], [place] [address] [place], [place] [address]    Culture STAPHYLOCOCCUS AUREUS (A)  Final   Report Status 11/02/2021 FINAL  Final   Organism ID, Bacteria STAPHYLOCOCCUS AUREUS  Final      Susceptibility   Staphylococcus aureus - MIC*    CIPROFLOXACIN <=0.5 SENSITIVE Sensitive     ERYTHROMYCIN >=8 RESISTANT Resistant     GENTAMICIN <=0.5 SENSITIVE Sensitive     OXACILLIN 0.5 SENSITIVE Sensitive     TETRACYCLINE <=1 SENSITIVE Sensitive     VANCOMYCIN <=0.5 SENSITIVE Sensitive     TRIMETH/SULFA <=10 SENSITIVE Sensitive     CLINDAMYCIN RESISTANT Resistant     RIFAMPIN <=0.5 SENSITIVE Sensitive     Inducible Clindamycin POSITIVE Resistant     * STAPHYLOCOCCUS AUREUS  Blood Culture ID Panel (Reflexed)     Status: Abnormal   Collection Time: 10/30/21 10:02 AM  Result Value Ref Range Status   Enterococcus faecalis NOT DETECTED NOT DETECTED Final   Enterococcus Faecium NOT DETECTED NOT DETECTED Final   Listeria monocytogenes NOT DETECTED NOT DETECTED Final   Staphylococcus species DETECTED (A) NOT DETECTED Final    Comment: CRITICAL RESULT CALLED TO, READ BACK BY AND VERIFIED WITH: Leonides Schanz RN 2239 10/30/21 HNM    Staphylococcus aureus (BCID) DETECTED (A) NOT DETECTED Final    Comment: CRITICAL RESULT CALLED TO, READ BACK BY AND VERIFIED WITH: Leonides Schanz RN 2239 10/30/21 HNM    Staphylococcus epidermidis NOT DETECTED NOT DETECTED Final   Staphylococcus lugdunensis NOT DETECTED NOT DETECTED Final   Streptococcus species NOT DETECTED NOT DETECTED Final   Streptococcus agalactiae NOT DETECTED NOT DETECTED Final   Streptococcus pneumoniae NOT DETECTED NOT DETECTED Final   Streptococcus pyogenes NOT DETECTED NOT DETECTED Final   A.calcoaceticus-baumannii NOT DETECTED NOT DETECTED Final   Bacteroides fragilis NOT DETECTED NOT  DETECTED Final   Enterobacterales NOT DETECTED NOT DETECTED Final   Enterobacter cloacae complex NOT DETECTED NOT DETECTED Final   Escherichia coli NOT DETECTED NOT DETECTED Final   Klebsiella aerogenes NOT DETECTED NOT DETECTED Final   Klebsiella oxytoca NOT DETECTED NOT DETECTED Final   Klebsiella pneumoniae NOT DETECTED NOT DETECTED Final   Proteus species NOT DETECTED NOT DETECTED Final   Salmonella species NOT DETECTED NOT DETECTED Final   Serratia marcescens NOT DETECTED NOT DETECTED Final   Haemophilus influenzae NOT DETECTED NOT DETECTED Final   Neisseria meningitidis NOT DETECTED NOT DETECTED Final   Pseudomonas aeruginosa NOT DETECTED NOT DETECTED Final   Stenotrophomonas maltophilia NOT DETECTED NOT DETECTED Final   Candida albicans NOT DETECTED NOT DETECTED Final   Candida auris NOT DETECTED NOT DETECTED Final   Candida glabrata NOT DETECTED NOT DETECTED Final   Candida krusei NOT DETECTED NOT DETECTED Final   Candida parapsilosis NOT DETECTED NOT DETECTED Final   Candida tropicalis NOT DETECTED NOT DETECTED Final   Cryptococcus neoformans/gattii NOT DETECTED NOT DETECTED Final   Meth resistant mecA/C and MREJ NOT DETECTED NOT DETECTED Final    Comment: Performed at [facility], [address] [place], [place] [address]  Resp Panel by RT-PCR (Flu A&B, Covid) Nasopharyngeal Swab     Status: None   Collection Time: 10/30/21 12:14 PM   Specimen: Nasopharyngeal Swab; Nasopharyngeal(NP)  swabs in vial transport medium  Result Value Ref Range Status   SARS Coronavirus 2 by RT PCR NEGATIVE NEGATIVE Final    Comment: (NOTE) SARS-CoV-2 target nucleic acids are NOT DETECTED.  The SARS-CoV-2 RNA is generally detectable in upper respiratory specimens during the acute phase of infection. The lowest concentration of SARS-CoV-2 viral copies this assay can detect is 138 copies/mL. A negative result does not preclude SARS-Cov-2 infection and should not be used as the sole  basis for treatment or other patient management decisions. A negative result may occur with  improper specimen collection/handling, submission of specimen other than nasopharyngeal swab, presence of viral mutation(s) within the areas targeted by this assay, and inadequate number of viral copies(<138 copies/mL). A negative result must be combined with clinical observations, patient history, and epidemiological information. The expected result is Negative.  Fact Sheet for Patients:  EntrepreneurPulse.com.au  Fact Sheet for Healthcare Providers:  IncredibleEmployment.be  This test is no t yet approved or cleared by the [place] FDA and  has been authorized for detection and/or diagnosis of SARS-CoV-2 by FDA under an Emergency Use Authorization (EUA). This EUA will remain  in effect (meaning this test can be used) for the duration of the COVID-19 declaration under Section 564(b)(1) of the Act, 21 U.S.C.section 360bbb-3(b)(1), unless the authorization is terminated  or revoked sooner.       Influenza A by PCR NEGATIVE NEGATIVE Final   Influenza B by PCR NEGATIVE NEGATIVE Final    Comment: (NOTE) The Xpert Xpress SARS-CoV-2/FLU/RSV plus assay is intended as an aid in the diagnosis of influenza from Nasopharyngeal swab specimens and should not be used as a sole basis for treatment. Nasal washings and aspirates are unacceptable for Xpert Xpress SARS-CoV-2/FLU/RSV testing.  Fact Sheet for Patients: EntrepreneurPulse.com.au  Fact Sheet for Healthcare Providers: IncredibleEmployment.be  This test is not yet approved or cleared by the [place] FDA and has been authorized for detection and/or diagnosis of SARS-CoV-2 by FDA under an Emergency Use Authorization (EUA). This EUA will remain in effect (meaning this test can be used) for the duration of the COVID-19 declaration under Section 564(b)(1) of the Act,  21 U.S.C. section 360bbb-3(b)(1), unless the authorization is terminated or revoked.  Performed at [facility], [address] [place], [place] [address]   Urine Culture     Status: Abnormal   Collection Time: 10/31/21 10:00 AM   Specimen: Urine, Random  Result Value Ref Range Status   Specimen Description   Final    URINE, RANDOM Performed at [facility], [address], [place], [place] [address]    Special Requests   Final    NONE Performed at [facility], [address] [place], [place] [address]    Culture (A)  Final    <10,000 COLONIES/mL INSIGNIFICANT GROWTH Performed at [facility], [place] [address] [place], [place] [address]    Report Status 11/02/2021 FINAL  Final  Resp Panel by RT-PCR (Flu A&B, Covid) Nasopharyngeal Swab     Status: None   Collection Time: 10/31/21 10:11 AM   Specimen: Nasopharyngeal Swab; Nasopharyngeal(NP) swabs in vial transport medium  Result Value Ref Range Status   SARS Coronavirus 2 by RT PCR NEGATIVE NEGATIVE Final    Comment: (NOTE) SARS-CoV-2 target nucleic acids are NOT DETECTED.  The SARS-CoV-2 RNA is generally detectable in upper respiratory specimens during the acute phase of infection. The lowest concentration of SARS-CoV-2 viral copies this assay can detect is 138  copies/mL. A negative result does not preclude SARS-Cov-2 infection and should not be used as the sole basis for treatment or other patient management decisions. A negative result may occur with  improper specimen collection/handling, submission of specimen other than nasopharyngeal swab, presence of viral mutation(s) within the areas targeted by this assay, and inadequate number of viral copies(<138 copies/mL). A negative result must be combined with clinical observations, patient history, and epidemiological information. The expected result is Negative.  Fact Sheet for Patients:   EntrepreneurPulse.com.au  Fact Sheet for Healthcare Providers:  IncredibleEmployment.be  This test is no t yet approved or cleared by the [place] FDA and  has been authorized for detection and/or diagnosis of SARS-CoV-2 by FDA under an Emergency Use Authorization (EUA). This EUA will remain  in effect (meaning this test can be used) for the duration of the COVID-19 declaration under Section 564(b)(1) of the Act, 21 U.S.C.section 360bbb-3(b)(1), unless the authorization is terminated  or revoked sooner.       Influenza A by PCR NEGATIVE NEGATIVE Final   Influenza B by PCR NEGATIVE NEGATIVE Final    Comment: (NOTE) The Xpert Xpress SARS-CoV-2/FLU/RSV plus assay is intended as an aid in the diagnosis of influenza from Nasopharyngeal swab specimens and should not be used as a sole basis for treatment. Nasal washings and aspirates are unacceptable for Xpert Xpress SARS-CoV-2/FLU/RSV testing.  Fact Sheet for Patients: EntrepreneurPulse.com.au  Fact Sheet for Healthcare Providers: IncredibleEmployment.be  This test is not yet approved or cleared by the [place] FDA and has been authorized for detection and/or diagnosis of SARS-CoV-2 by FDA under an Emergency Use Authorization (EUA). This EUA will remain in effect (meaning this test can be used) for the duration of the COVID-19 declaration under Section 564(b)(1) of the Act, 21 U.S.C. section 360bbb-3(b)(1), unless the authorization is terminated or revoked.  Performed at [facility], [address], [place], [place] [address]   Blood Culture (routine x 2)     Status: Abnormal   Collection Time: 10/31/21 10:13 AM   Specimen: BLOOD  Result Value Ref Range Status   Specimen Description   Final    BLOOD RIGHT WRIST Performed at [facility], [address] [place], [place] [address]    Special Requests   Final    BOTTLES  DRAWN AEROBIC AND ANAEROBIC Blood Culture adequate volume Performed at [facility], [address] [place], [place] [address]    Culture  Setup Time   Final    GRAM POSITIVE COCCI IN BOTH AEROBIC AND ANAEROBIC BOTTLES CRITICAL RESULT CALLED TO, READ BACK BY AND VERIFIED WITH: SHEEMA HALLAGI AT 1900 ON 10/31/21 BY SS    Culture (A)  Final    STAPHYLOCOCCUS AUREUS SUSCEPTIBILITIES PERFORMED ON PREVIOUS CULTURE WITHIN THE LAST 5 DAYS. Performed at [facility], [place] [address], [place], [place] [address]    Report Status 11/02/2021 FINAL  Final  Blood Culture (routine x 2)     Status: Abnormal   Collection Time: 10/31/21 10:13 AM   Specimen: BLOOD  Result Value Ref Range Status   Specimen Description   Final    BLOOD LEFT WRIST Performed at [facility], [address], [place], [place] [address]    Special Requests   Final    BOTTLES DRAWN AEROBIC AND ANAEROBIC BCAV Performed at [facility], [address], [place], [place] [address]    Culture  Setup Time   Final    GRAM POSITIVE  COCCI IN BOTH AEROBIC AND ANAEROBIC BOTTLES CRITICAL VALUE NOTED.  VALUE IS CONSISTENT WITH PREVIOUSLY REPORTED AND CALLED VALUE. Performed at [facility], [address] [place], [place] [address]    Culture (A)  Final    STAPHYLOCOCCUS AUREUS SUSCEPTIBILITIES PERFORMED ON PREVIOUS CULTURE WITHIN THE LAST 5 DAYS. Performed at [facility], [place] [address], [place], [place] [address]    Report Status 11/02/2021 FINAL  Final  Blood Culture ID Panel (Reflexed)     Status: Abnormal   Collection Time: 10/31/21 10:13 AM  Result Value Ref Range Status   Enterococcus faecalis NOT DETECTED NOT DETECTED Final   Enterococcus Faecium NOT DETECTED NOT DETECTED Final   Listeria monocytogenes NOT DETECTED NOT DETECTED Final   Staphylococcus species DETECTED (A) NOT DETECTED Final    Comment: CRITICAL RESULT CALLED TO, READ BACK BY AND VERIFIED  WITH: SHEEMA HALLAGI AT 1900 ON 10/31/21 BY SS    Staphylococcus aureus (BCID) DETECTED (A) NOT DETECTED Final    Comment: CRITICAL RESULT CALLED TO, READ BACK BY AND VERIFIED WITH: SHEEMA HALLAGI AT 1900 ON 10/31/21 BY SS    Staphylococcus epidermidis NOT DETECTED NOT DETECTED Final   Staphylococcus lugdunensis NOT DETECTED NOT DETECTED Final   Streptococcus species NOT DETECTED NOT DETECTED Final   Streptococcus agalactiae NOT DETECTED NOT DETECTED Final   Streptococcus pneumoniae NOT DETECTED NOT DETECTED Final   Streptococcus pyogenes NOT DETECTED NOT DETECTED Final   A.calcoaceticus-baumannii NOT DETECTED NOT DETECTED Final   Bacteroides fragilis NOT DETECTED NOT DETECTED Final   Enterobacterales NOT DETECTED NOT DETECTED Final   Enterobacter cloacae complex NOT DETECTED NOT DETECTED Final   Escherichia coli NOT DETECTED NOT DETECTED Final   Klebsiella aerogenes NOT DETECTED NOT DETECTED Final   Klebsiella oxytoca NOT DETECTED NOT DETECTED Final   Klebsiella pneumoniae NOT DETECTED NOT DETECTED Final   Proteus species NOT DETECTED NOT DETECTED Final   Salmonella species NOT DETECTED NOT DETECTED Final   Serratia marcescens NOT DETECTED NOT DETECTED Final   Haemophilus influenzae NOT DETECTED NOT DETECTED Final   Neisseria meningitidis NOT DETECTED NOT DETECTED Final   Pseudomonas aeruginosa NOT DETECTED NOT DETECTED Final   Stenotrophomonas maltophilia NOT DETECTED NOT DETECTED Final   Candida albicans NOT DETECTED NOT DETECTED Final   Candida auris NOT DETECTED NOT DETECTED Final   Candida glabrata NOT DETECTED NOT DETECTED Final   Candida krusei NOT DETECTED NOT DETECTED Final   Candida parapsilosis NOT DETECTED NOT DETECTED Final   Candida tropicalis NOT DETECTED NOT DETECTED Final   Cryptococcus neoformans/gattii NOT DETECTED NOT DETECTED Final   Meth resistant mecA/C and MREJ NOT DETECTED NOT DETECTED Final    Comment: Performed at [facility], [address] [place], [place] [address]  CULTURE, BLOOD (ROUTINE X 2) w Reflex to ID Panel     Status: None (Preliminary result)   Collection Time: 10/20/2021  3:42 AM   Specimen: BLOOD  Result Value Ref Range Status   Specimen Description BLOOD LEFT HAND  Final   Special Requests   Final    BOTTLES DRAWN AEROBIC AND ANAEROBIC Blood Culture results may not be optimal due to an excessive volume of blood received in culture bottles   Culture   Final    NO GROWTH < 12 HOURS Performed at [facility], [address] [place], [place] [address]    Report Status PENDING  Incomplete  CULTURE, BLOOD (ROUTINE X 2) w Reflex to ID Panel  Status: None (Preliminary result)   Collection Time: 10/14/2021  3:48 AM   Specimen: BLOOD  Result Value Ref Range Status   Specimen Description BLOOD LEFT HAND  Final   Special Requests   Final    BOTTLES DRAWN AEROBIC AND ANAEROBIC Blood Culture results may not be optimal due to an excessive volume of blood received in culture bottles   Culture   Final    NO GROWTH < 12 HOURS Performed at [facility], [address] [place], [place] [address]    Report Status PENDING  Incomplete     Radiological Exams on Admission: ECHO TEE  Result Date: 11/03/2021    TRANSESOPHOGEAL ECHO REPORT   Patient Name:   DEMETRIO LEIGHTY Date of Exam: 11/03/2021 Medical Rec #:  [id number]      Height:       76.0 in Accession #:    [id number]     Weight:       219.4 lb Date of Birth:  07-Aug-1941      BSA:          2.304 m Patient Age:    59 years       BP:           130/62 mmHg Patient Gender: M              HR:           64 bpm. Exam Location:  [facility] Procedure: Transesophageal Echo, Cardiac Doppler and Color Doppler Indications:     Bacteremia R783.81  History:         Patient has prior history of Echocardiogram examinations, most                  recent 10/31/2021. CAD, Pacemaker; Risk Factors:Hypertension                  and Diabetes. S/P TAVR.  Sonographer:     Sherrie Sport  Referring Phys:  [id number] [place] Diagnosing Phys: Kate Sable MD PROCEDURE: The transesophogeal probe was passed without difficulty through the esophogus of the patient. Sedation performed by performing physician. The patient developed no complications during the procedure. IMPRESSIONS  1. Left ventricular ejection fraction, by estimation, is 55 to 60%. The left ventricle has normal function.  2. Right ventricular systolic function is normal. The right ventricular size is normal.  3. No left atrial/left atrial appendage thrombus was detected.  4. The mitral valve is normal in structure. Mild mitral valve regurgitation.  5. The aortic valve has been repaired/replaced. Aortic valve regurgitation is not visualized. Echo findings are consistent with normal structure and function of the aortic valve prosthesis. Conclusion(s)/Recommendation(s): No evidence of vegetation/infective endocarditis on this transesophageael echocardiogram. FINDINGS  Left Ventricle: Left ventricular ejection fraction, by estimation, is 55 to 60%. The left ventricle has normal function. The left ventricular internal cavity size was normal in size. Right Ventricle: The right ventricular size is normal. No increase in right ventricular wall thickness. Right ventricular systolic function is normal. Left Atrium: Left atrial size was normal in size. No left atrial/left atrial appendage thrombus was detected. Right Atrium: Right atrial size was normal in size. Pericardium: There is no evidence of pericardial effusion. Mitral Valve: The mitral valve is normal in structure. Mild mitral valve regurgitation. Tricuspid Valve: The tricuspid valve is normal in structure. Tricuspid valve regurgitation is mild. Aortic Valve: The aortic valve has been repaired/replaced. Aortic valve regurgitation is not visualized. There is a Sapien prosthetic, stented (  TAVR) valve present in the aortic position. Echo findings are consistent with normal structure and  function of  the aortic valve prosthesis. Pulmonic Valve: The pulmonic valve was grossly normal. Pulmonic valve regurgitation is not visualized. Aorta: The aortic root is normal in size and structure. IAS/Shunts: No atrial level shunt detected by color flow Doppler. Additional Comments: A device lead is visualized. Kate Sable MD Electronically signed by Kate Sable MD Signature Date/Time: 11/03/2021/2:39:53 PM    Final     EKG: Independently reviewed. Sinus tachycardia, rate 104, PAC, RBBB.   Assessment/Plan   1. 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 Thrombocytopenia  - Platelets 70 on admission, stable in 40-range for the 4 days leading up to transfer to [facility]  - Hx of intermittent thrombocytopenia going back at least a yr   - No bleeding  - Continue to hold Plavix and pharmacologic VTE ppx, continue daily CBC    3. Elevated transaminases  - Transaminases elevated at [facility] with normal alk phos, normal bili, and no tenderness  - Improving, plan to monitor and resume Crestor if continues to improve    4. SSS with pacemaker  - Planned for device extraction as above    5. CAD  - No anginal complaints  - Troponin was elevated to 200s on admission to [facility] on 11/18, seen by cardiology there, had preserved EF on echo, and was attributed to demand ishcemia in setting of bacteremia with hypotension, renal insufficiency, and anemia  - Plavix held in light of low platelets, statin on hold d/t transaminitis, continuing Coreg and ASA    6. Type II DM  - A1c was 6.9% in November 2022  - Continue CBG checks and insulin   7. CKD IIIa   - SCr is 1.59 on admission, close to apparent baseline  - Renally-dose medications, monitor   8. Hypokalemia  - Potassium 3.3 on admission   - Replace    9. AS s/p TAVR  - No evidence for endocarditis on TEE 11/03/21  - Will need long-term suppressive antibiotics per ID    DVT prophylaxis: SCDs  Code Status: Full  Level of Care: Level of care: Telemetry Cardiac Family Communication: none present  Disposition Plan:  Patient is from: Home  Anticipated d/c is to: TBD Anticipated d/c date is: 11/09/21 Patient currently: Pending pacemaker extraction, stable/improved platelets  Consults called: none  Admission status: Inpatient     Vianne Bulls, MD [facility]  11/03/2021, 9:22 PM

## 2021-11-05 DIAGNOSIS — T827XXA Infection and inflammatory reaction due to other cardiac and vascular devices, implants and grafts, initial encounter: Principal | ICD-10-CM

## 2021-11-05 DIAGNOSIS — R7881 Bacteremia: Secondary | ICD-10-CM

## 2021-11-05 DIAGNOSIS — B9561 Methicillin susceptible Staphylococcus aureus infection as the cause of diseases classified elsewhere: Secondary | ICD-10-CM

## 2021-11-05 DIAGNOSIS — N179 Acute kidney failure, unspecified: Secondary | ICD-10-CM

## 2021-11-05 DIAGNOSIS — D638 Anemia in other chronic diseases classified elsewhere: Secondary | ICD-10-CM

## 2021-11-05 LAB — COMPREHENSIVE METABOLIC PANEL
ALT: 13 U/L (ref 0–44)
AST: 75 U/L — ABNORMAL HIGH (ref 15–41)
Albumin: 2 g/dL — ABNORMAL LOW (ref 3.5–5.0)
Alkaline Phosphatase: 83 U/L (ref 38–126)
Anion gap: 10 (ref 5–15)
BUN: 50 mg/dL — ABNORMAL HIGH (ref 8–23)
CO2: 18 mmol/L — ABNORMAL LOW (ref 22–32)
Calcium: 7.5 mg/dL — ABNORMAL LOW (ref 8.9–10.3)
Chloride: 104 mmol/L (ref 98–111)
Creatinine, Ser: 1.75 mg/dL — ABNORMAL HIGH (ref 0.61–1.24)
GFR, Estimated: 39 mL/min — ABNORMAL LOW (ref >60)
Glucose, Bld: 153 mg/dL — ABNORMAL HIGH (ref 70–99)
Potassium: 3 mmol/L — ABNORMAL LOW (ref 3.5–5.1)
Sodium: 132 mmol/L — ABNORMAL LOW (ref 135–145)
Total Bilirubin: 0.6 mg/dL (ref 0.3–1.2)
Total Protein: 4.6 g/dL — ABNORMAL LOW (ref 6.5–8.1)

## 2021-11-05 LAB — CBC WITH DIFFERENTIAL/PLATELET
Abs Immature Granulocytes: 0.34 10*3/uL — ABNORMAL HIGH (ref 0.00–0.07)
Basophils Absolute: 0 10*3/uL (ref 0.0–0.1)
Basophils Relative: 0 %
Eosinophils Absolute: 0.1 10*3/uL (ref 0.0–0.5)
Eosinophils Relative: 1 %
HCT: 22.9 % — ABNORMAL LOW (ref 39.0–52.0)
Hemoglobin: 7.9 g/dL — ABNORMAL LOW (ref 13.0–17.0)
Immature Granulocytes: 10 %
Lymphocytes Relative: 8 %
Lymphs Abs: 0.3 10*3/uL — ABNORMAL LOW (ref 0.7–4.0)
MCH: 31.3 pg (ref 26.0–34.0)
MCHC: 34.5 g/dL (ref 30.0–36.0)
MCV: 90.9 fL (ref 80.0–100.0)
Monocytes Absolute: 0.3 10*3/uL (ref 0.1–1.0)
Monocytes Relative: 9 %
Neutro Abs: 2.6 10*3/uL (ref 1.7–7.7)
Neutrophils Relative %: 72 %
Platelets: 40 10*3/uL — ABNORMAL LOW (ref 150–400)
RBC: 2.52 MIL/uL — ABNORMAL LOW (ref 4.22–5.81)
RDW: 13.9 % (ref 11.5–15.5)
WBC: 3.6 10*3/uL — ABNORMAL LOW (ref 4.0–10.5)
nRBC: 0 % (ref 0.0–0.2)

## 2021-11-05 LAB — GLUCOSE, CAPILLARY
Glucose-Capillary: 225 mg/dL — ABNORMAL HIGH (ref 70–99)
Glucose-Capillary: 242 mg/dL — ABNORMAL HIGH (ref 70–99)
Glucose-Capillary: 367 mg/dL — ABNORMAL HIGH (ref 70–99)
Glucose-Capillary: 208 mg/dL — ABNORMAL HIGH (ref 70–99)

## 2021-11-05 LAB — MAGNESIUM: Magnesium: 1.7 mg/dL (ref 1.7–2.4)

## 2021-11-05 LAB — PREPARE RBC (CROSSMATCH)

## 2021-11-05 LAB — HEPARIN INDUCED PLATELET AB (HIT ANTIBODY): Heparin Induced Plt Ab: 0.051 OD (ref 0.000–0.400)

## 2021-11-05 MED ORDER — ROSUVASTATIN CALCIUM 20 MG PO TABS: 40.0000 mg | ORAL_TABLET | Freq: Every day | ORAL | Status: DC

## 2021-11-05 MED ORDER — SODIUM CHLORIDE 0.9 % IV SOLN: INTRAVENOUS | Status: DC

## 2021-11-05 MED ORDER — ROSUVASTATIN CALCIUM 20 MG PO TABS
40.0000 mg | ORAL_TABLET | Freq: Every day | ORAL | Status: DC
  Administered 2021-11-05 – 2021-11-08 (×4): 40 mg via ORAL
  Filled 2021-11-05 (×4): qty 2

## 2021-11-05 MED ORDER — POTASSIUM CHLORIDE CRYS ER 20 MEQ PO TBCR
50.0000 meq | EXTENDED_RELEASE_TABLET | Freq: Two times a day (BID) | ORAL | Status: AC
  Administered 2021-11-05 (×2): 50 meq via ORAL
  Filled 2021-11-05 (×2): qty 1

## 2021-11-05 MED ORDER — WHITE PETROLATUM EX OINT
TOPICAL_OINTMENT | CUTANEOUS | Status: AC
  Administered 2021-11-05: 0.2
  Filled 2021-11-05: qty 28.35

## 2021-11-05 MED ORDER — SODIUM CHLORIDE 0.9% FLUSH
3.0000 mL | Freq: Two times a day (BID) | INTRAVENOUS | Status: DC
  Administered 2021-11-06 – 2021-11-08 (×5): 3 mL via INTRAVENOUS

## 2021-11-05 MED ORDER — PANTOPRAZOLE SODIUM 40 MG PO TBEC
40.0000 mg | DELAYED_RELEASE_TABLET | Freq: Two times a day (BID) | ORAL | Status: DC
  Administered 2021-11-05 – 2021-11-09 (×7): 40 mg via ORAL
  Filled 2021-11-05 (×7): qty 1

## 2021-11-05 MED ORDER — MAGNESIUM SULFATE 2 GM/50ML IV SOLN
2.0000 g | Freq: Once | INTRAVENOUS | Status: AC
  Administered 2021-11-05: 2 g via INTRAVENOUS
  Filled 2021-11-05: qty 50

## 2021-11-05 MED ORDER — SODIUM CHLORIDE 0.9% IV SOLUTION: Freq: Once | INTRAVENOUS | Status: DC

## 2021-11-05 NOTE — Plan of Care (Signed)
  Problem: Clinical Measurements: Goal: Will remain free from infection Outcome: Not Progressing   

## 2021-11-05 NOTE — Progress Notes (Signed)
PROGRESS NOTE    Nathan Singleton  OZH:[id number] DOB: 1941-05-15 DOA: 11/02/2021 PCP: Ranae Plumber, PA   Brief Narrative:  Nathan Singleton is a pleasant 80 y.o. WM PMHx coronary artery disease, SSS with pacemaker, AS s/p TAVR,insulin-dependent diabetes mellitus, CKD stage IIIa,  and 2 episodes of MSSA bacteremia earlier this year attributed to right foot infection and managed with second toe amputation and 6 weeks IV cefazolin, now with his third episode of MSSA bacteremia and transferred from [facility] for pacemaker extraction.    [facility] Course: Patient was seen in the [facility] ED on 10/30/2021 with fevers, had blood cultures collected, and went home with Keflex.  He continued to have fever to 103 F at home that night, the cultures started growing gram-positive cocci, and he came back into the [facility] ED the following day where he had fluid responsive hypotension and-year-old was admitted (11/18 [facility]).   ID provided consultation for the recurrent bacteremia which ended up being MSSA again, patient underwent TEE on 11/03/2021 with no evidence for endocarditis.  He was continued on IV cefazolin, transferred to [facility] for extraction of pacemaker per ID recommendation, and will need long-term suppressive therapy in light of his prosthetic AV.   He appears to have chronic intermittent thrombocytopenia, platelet count 70 on admission, then decreased to the 40 range where it remained stable for the 4 days leading up to transfer.  Plavix and pharmacologic VTE prophylaxis were held for this reason and he was continued on baby aspirin.   Troponin was elevated to the 200s on admission, he was evaluated by cardiology, had preserved EF on echo, and this was attributed to demand ischemia in the setting of sepsis with hypotension and renal insufficiency.   Transaminases were elevated during the admission with normal alkaline phosphatase and normal bilirubin.  Statin was held and transaminases have been  trending down.   He developed loose stools without abdominal pain or leukocytosis. This was felt to be related to antibiotics, laxatives were held, and probiotic started.    Subjective: A/O x4, hard of hearing.  Negative CP, negative SOB.   Assessment & Plan:  Covid vaccination; vaccinated 3/3  Principal Problem:   MSSA bacteremia Active Problems:   Coronary artery disease involving coronary bypass graft of native heart with angina pectoris (HCC)   S/P TAVR (transcatheter aortic valve replacement)   Thrombocytopenia (HCC)   Anemia of chronic disease   CKD stage G3a/A3, GFR 45-59 and albumin creatinine ratio >300 mg/g (HCC)   Type 2 diabetes mellitus with diabetic chronic kidney disease ([place])   Hypokalemia   Elevated transaminase level  MSSA bacteremia  - Admitted to [facility] on 10/31/21 with 3rd episode of MSSA bacteremia in a yr, seen by ID, started on cefazolin, had no evidence for endocarditis on TEE 11/03/21, and was transferred to [facility] for pacemaker extraction  - Blood cultures repeated 11/05/2021  - Cardiology coordinating pacemaker extraction, continue cefazolin, will need long-term suppressive antibiotics per ID at [facility]   -11/23 spoke with card master patient is on their list to be seen today.  Explained to patient and wife will be up to cardiology for exact timing when they remove pacer given his bacteremia, and how long he will have to remain on antibiotics prior to them reinserting new pacer. -11/23 discussed case with ID concerning length of treatment once pacer removed.   Thrombocytopenia - Platelets 70 on admission, stable in 40-range for the 4 days leading up to transfer to [facility]  -  Hx of intermittent thrombocytopenia going back at least a yr   -No sign of overt bleeding - Continue to hold Plavix and pharmacologic VTE ppx, continue daily CBC   Lab Results  Component Value Date   HGB 7.9 (L) 11/05/2021   HGB 8.3 (L) 10/20/2021   HGB 8.6 (L) 11/03/2021   HGB 9.1 (L)  11/02/2021   HGB 8.9 (L) 11/01/2021     Elevated transaminase -Resolving - Continue to hold statin   Sick sinus syndrome, s/p pacemaker -Plan extraction of pacemaker by cardiology - Per cardiology hemoglobin goal> 9 - Strict in and out - Daily weight  CAD - No anginal complaints  - Troponin was elevated to 200s on admission to [facility] on 11/18, seen by cardiology there, had preserved EF on echo, and was attributed to demand ishcemia in setting of bacteremia with hypotension, renal insufficiency, and anemia  - Plavix held in light of low platelets, statin on hold d/t transaminitis, continuing Coreg and ASA    AS s/p TAVR  - No evidence for endocarditis on TEE 11/03/21  - Will need long-term suppressive antibiotics per ID    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  - A1c was 6.9% in November 2022  - Continue CBG checks and insulin   Acute on CKD Stage IIIa (baseline Cr~1.59) - Monitor renal function closely Lab Results  Component Value Date   CREATININE 1.75 (H) 11/05/2021   CREATININE 1.59 (H) 11/03/2021   CREATININE 1.64 (H) 11/02/2021   CREATININE 1.77 (H) 11/01/2021   CREATININE 1.88 (H) 10/31/2021  -7/23 gently hydrate normal saline 74ml/hr  Hypokalemia -Potassium goal> 4 -K Dur 50 mEq x 2 dose  Hypomagnesmia - Magnesium goal> 2 - Magnesium IV 2 g     DVT prophylaxis: SCD Code Status: Full Family Communication: 11/23 wife at bedside for discussion of plan of care all questions answered Status is: Inpatient    Dispo: The patient is from: Home              Anticipated d/c is to: Home              Anticipated d/c date is: 3 days              Patient currently is not medically stable to d/c.      Consultants:  Cardiology   Procedures/Significant Events:  Echo 10/31/21  1. Left ventricular ejection fraction, by estimation, is 60 to 65%. The  left ventricle has normal function. The left ventricle has no regional  wall motion  abnormalities. There is mild left ventricular hypertrophy.  Left ventricular diastolic parameters  are consistent with Grade I diastolic dysfunction (impaired relaxation).   2. Right ventricular systolic function is normal. The right ventricular  size is normal. Tricuspid regurgitation signal is inadequate for assessing  PA pressure.   3. Left atrial size was mildly dilated.   4. The mitral valve is normal in structure. No evidence of mitral valve  regurgitation. No evidence of mitral stenosis.   5. The aortic valve was not well visualized. Aortic valve regurgitation  is not visualized. No aortic stenosis is present. Echo findings are  consistent with normal structure and function of the aortic valve  prosthesis. Aortic valve mean gradient measures   6.5 mmHg.  11/21 TEE   Left Ventricle: Left ventricular ejection fraction, by estimation, is 55  to 60%. The left ventricle has normal function. The left ventricular  internal cavity size was normal in size.  Right Ventricle: The right ventricular size is normal. No increase in  right ventricular wall thickness. Right ventricular systolic function is  normal.   Left Atrium: Left atrial size was normal in size. No left atrial/left  atrial appendage thrombus was detected.   Right Atrium: Right atrial size was normal in size.   Pericardium: There is no evidence of pericardial effusion.   Mitral Valve: The mitral valve is normal in structure. Mild mitral valve  regurgitation.   Tricuspid Valve: The tricuspid valve is normal in structure. Tricuspid  valve regurgitation is mild.   Aortic Valve: The aortic valve has been repaired/replaced. Aortic valve  regurgitation is not visualized. There is a Sapien prosthetic, stented  (TAVR) valve present in the aortic position. Echo findings are consistent  with normal structure and function of   the aortic valve prosthesis.   Pulmonic Valve: The pulmonic valve was grossly normal. Pulmonic  valve  regurgitation is not visualized.    I have personally reviewed and interpreted all radiology studies and my findings are as above.  VENTILATOR SETTINGS:    Cultures 11/17 blood LEFT wrist positive MSSA 11/17 blood LEFT AC positive MSSA 11/18 urine insignificant growth 11/18 blood RIGHT wrist positive MSSA 11/20 blood LEFT wrist positive MSSA  11/22 blood LEFT hand NGTD 11/22 blood LEFT hand NGTD    Antimicrobials: Anti-infectives (From admission, onward)    Start     Dose/Rate Route Frequency Ordered Stop   11/06/2021 2200  ceFAZolin (ANCEF) IVPB 2g/100 mL premix        2 g 200 mL/hr over 30 Minutes Intravenous Every 8 hours 11/09/2021 2035           Devices    LINES / TUBES:      Continuous Infusions:   ceFAZolin (ANCEF) IV 2 g (11/05/21 9449)     Objective: Vitals:   11/05/21 0005 11/05/21 0215 11/05/21 0352 11/05/21 0500  BP:   (!) 122/58   Pulse: 67  68   Resp: (!) 21     Temp:   98.7 F (37.1 C)   TempSrc:   Oral   SpO2: 98%  98%   Weight:    104.2 kg  Height:  6\' 4"  (1.93 m)      Intake/Output Summary (Last 24 hours) at 11/05/2021 0730 Last data filed at 11/05/2021 0554 Gross per 24 hour  Intake 200 ml  Output 750 ml  Net -550 ml   Filed Weights   11/05/21 0500  Weight: 104.2 kg    Examination:  General: A/O x4, No acute respiratory distress Eyes: negative scleral hemorrhage, negative anisocoria, negative icterus ENT: Negative Runny nose, negative gingival bleeding, Neck:  Negative scars, masses, torticollis, lymphadenopathy, JVD Lungs: Clear to auscultation bilaterally without wheezes or crackles Cardiovascular: Regular rate and rhythm without murmur gallop or rub normal S1 and S2 Abdomen: negative abdominal pain, nondistended, positive soft, bowel sounds, no rebound, no ascites, no appreciable mass Extremities: No significant cyanosis, clubbing, or edema bilateral lower extremities Skin: Negative rashes, lesions,  ulcers Psychiatric:  Negative depression, negative anxiety, negative fatigue, negative mania  Central nervous system:  Cranial nerves II through XII intact, tongue/uvula midline, all extremities muscle strength 5/5, sensation intact throughout, negative dysarthria, negative expressive aphasia, negative receptive aphasia.  .     Data Reviewed: Care during the described time interval was provided by me .  I have reviewed this patient's available data, including medical history, events of note, physical examination, and all test results as part  of my evaluation.   CBC: Recent Labs  Lab 10/30/21 1003 10/31/21 1013 11/01/21 0736 11/02/21 0553 11/03/21 0536 11/09/2021 1121 11/05/21 0142  WBC 5.4 6.7 4.5 3.7* 4.2 4.0 3.6*  NEUTROABS 4.9 6.4  --   --   --  3.2 2.6  HGB 10.2* 9.7* 8.9* 9.1* 8.6* 8.3* 7.9*  HCT 30.5* 28.3* 25.6* 27.2* 25.2* 23.9* 22.9*  MCV 93.8 92.5 90.5 91.3 89.4 91.2 90.9  PLT 110* 70* 37* 41* 43* 42* 40*   Basic Metabolic Panel: Recent Labs  Lab 10/31/21 1013 11/01/21 0736 11/02/21 0553 11/03/21 0536 11/05/21 0142  NA 136 131* 132* 135 132*  K 3.5 3.5 3.4* 3.3* 3.0*  CL 106 102 104 104 104  CO2 21* 22 23 22  18*  GLUCOSE 120* 182* 108* 174* 153*  BUN 40* 50* 47* 44* 50*  CREATININE 1.88* 1.77* 1.64* 1.59* 1.75*  CALCIUM 7.3* 7.4* 7.8* 7.8* 7.5*  MG  --   --   --   --  1.7   GFR: Estimated Creatinine Clearance: 45.4 mL/min (A) (by C-G formula based on SCr of 1.75 mg/dL (H)). Liver Function Tests: Recent Labs  Lab 10/30/21 1003 10/31/21 1013 11/01/21 0736 11/02/21 0553 11/05/21 0142  AST 53* 352* 219* 191* 75*  ALT 38 231* 141* 75* 13  ALKPHOS 87 58 60 65 83  BILITOT 0.7 0.6 0.5 0.3 0.6  PROT 7.1 5.4* 5.4* 5.7* 4.6*  ALBUMIN 3.8 2.9* 2.5* 2.7* 2.0*   No results for input(s): LIPASE, AMYLASE in the last 168 hours. No results for input(s): AMMONIA in the last 168 hours. Coagulation Profile: Recent Labs  Lab 10/30/21 1003 10/31/21 1013  10/23/2021 1121  INR 1.1 1.5* 1.6*   Cardiac Enzymes: No results for input(s): CKTOTAL, CKMB, CKMBINDEX, TROPONINI in the last 168 hours. BNP (last 3 results) No results for input(s): PROBNP in the last 8760 hours. HbA1C: No results for input(s): HGBA1C in the last 72 hours. CBG: Recent Labs  Lab 10/26/2021 1156 10/23/2021 1522 10/23/2021 1641 10/20/2021 2112 11/05/21 0625  GLUCAP 267* 269* 202* 124* 225*   Lipid Profile: No results for input(s): CHOL, HDL, LDLCALC, TRIG, CHOLHDL, LDLDIRECT in the last 72 hours. Thyroid Function Tests: No results for input(s): TSH, T4TOTAL, FREET4, T3FREE, THYROIDAB in the last 72 hours. Anemia Panel: Recent Labs    10/26/2021 1120  VITAMINB12 1,380*   Urine analysis:    Component Value Date/Time   COLORURINE YELLOW (A) 10/31/2021 1707   APPEARANCEUR HAZY (A) 10/31/2021 1707   LABSPEC 1.017 10/31/2021 1707   PHURINE 5.0 10/31/2021 1707   GLUCOSEU 50 (A) 10/31/2021 1707   HGBUR SMALL (A) 10/31/2021 1707   BILIRUBINUR NEGATIVE 10/31/2021 1707   [place] 10/31/2021 1707   PROTEINUR 100 (A) 10/31/2021 1707   NITRITE NEGATIVE 10/31/2021 1707   LEUKOCYTESUR NEGATIVE 10/31/2021 1707   Sepsis Labs: @LABRCNTIP (procalcitonin:4,lacticidven:4)  ) Recent Results (from the past 240 hour(s))  Culture, blood (Routine x 2)     Status: Abnormal   Collection Time: 10/30/21  9:51 AM   Specimen: BLOOD  Result Value Ref Range Status   Specimen Description   Final    BLOOD LEFT ANTECUBITAL Performed at [facility], [address], [place], [place] [address]    Special Requests   Final    BOTTLES DRAWN AEROBIC AND ANAEROBIC Blood Culture adequate volume Performed at [facility], [address], [place],  [address]    Culture  Setup Time   Final  GRAM POSITIVE COCCI IN BOTH AEROBIC AND ANAEROBIC BOTTLES CRITICAL VALUE NOTED.  VALUE IS CONSISTENT WITH PREVIOUSLY REPORTED AND CALLED VALUE. Performed at [facility], [address] [place], [place] [address]    Culture (A)  Final    STAPHYLOCOCCUS AUREUS SUSCEPTIBILITIES PERFORMED ON PREVIOUS CULTURE WITHIN THE LAST 5 DAYS. Performed at [facility], [place] [address], [place], [place] [address]    Report Status 11/02/2021 FINAL  Final  Culture, blood (Routine x 2)     Status: Abnormal   Collection Time: 10/30/21 10:02 AM   Specimen: BLOOD  Result Value Ref Range Status   Specimen Description   Final    BLOOD LEFT WRIST Performed at [facility] [address] [place], [place] [address]    Special Requests   Final    BOTTLES DRAWN AEROBIC AND ANAEROBIC Blood Culture results may not be optimal due to an excessive volume of blood received in culture bottles Performed at [facility], [address] [place], [place] [address]    Culture  Setup Time   Final    GRAM POSITIVE COCCI IN BOTH AEROBIC AND ANAEROBIC BOTTLES CRITICAL RESULT CALLED TO, READ BACK BY AND VERIFIED WITH: Leonides Schanz RN 8144 10/30/21 HNM Performed at [facility], [place] [address] [place], [place] [address]    Culture STAPHYLOCOCCUS AUREUS (A)  Final   Report Status 11/02/2021 FINAL  Final   Organism ID, Bacteria STAPHYLOCOCCUS AUREUS  Final      Susceptibility   Staphylococcus aureus - MIC*    CIPROFLOXACIN <=0.5 SENSITIVE Sensitive     ERYTHROMYCIN >=8 RESISTANT Resistant     GENTAMICIN <=0.5 SENSITIVE Sensitive     OXACILLIN 0.5 SENSITIVE Sensitive     TETRACYCLINE <=1 SENSITIVE Sensitive     VANCOMYCIN <=0.5 SENSITIVE Sensitive     TRIMETH/SULFA <=10 SENSITIVE Sensitive     CLINDAMYCIN RESISTANT Resistant     RIFAMPIN <=0.5 SENSITIVE Sensitive     Inducible Clindamycin POSITIVE Resistant     * STAPHYLOCOCCUS AUREUS  Blood Culture ID Panel (Reflexed)     Status: Abnormal   Collection Time: 10/30/21 10:02 AM  Result Value Ref Range Status   Enterococcus faecalis NOT DETECTED NOT DETECTED Final   Enterococcus Faecium  NOT DETECTED NOT DETECTED Final   Listeria monocytogenes NOT DETECTED NOT DETECTED Final   Staphylococcus species DETECTED (A) NOT DETECTED Final    Comment: CRITICAL RESULT CALLED TO, READ BACK BY AND VERIFIED WITH: Leonides Schanz RN 2239 10/30/21 HNM    Staphylococcus aureus (BCID) DETECTED (A) NOT DETECTED Final    Comment: CRITICAL RESULT CALLED TO, READ BACK BY AND VERIFIED WITH: Leonides Schanz RN 2239 10/30/21 HNM    Staphylococcus epidermidis NOT DETECTED NOT DETECTED Final   Staphylococcus lugdunensis NOT DETECTED NOT DETECTED Final   Streptococcus species NOT DETECTED NOT DETECTED Final   Streptococcus agalactiae NOT DETECTED NOT DETECTED Final   Streptococcus pneumoniae NOT DETECTED NOT DETECTED Final   Streptococcus pyogenes NOT DETECTED NOT DETECTED Final   A.calcoaceticus-baumannii NOT DETECTED NOT DETECTED Final   Bacteroides fragilis NOT DETECTED NOT DETECTED Final   Enterobacterales NOT DETECTED NOT DETECTED Final   Enterobacter cloacae complex NOT DETECTED NOT DETECTED Final   Escherichia coli NOT DETECTED NOT DETECTED Final   Klebsiella aerogenes NOT DETECTED NOT DETECTED Final   Klebsiella oxytoca NOT DETECTED NOT DETECTED Final   Klebsiella pneumoniae NOT DETECTED NOT DETECTED Final   Proteus species NOT DETECTED NOT DETECTED  Final   Salmonella species NOT DETECTED NOT DETECTED Final   Serratia marcescens NOT DETECTED NOT DETECTED Final   Haemophilus influenzae NOT DETECTED NOT DETECTED Final   Neisseria meningitidis NOT DETECTED NOT DETECTED Final   Pseudomonas aeruginosa NOT DETECTED NOT DETECTED Final   Stenotrophomonas maltophilia NOT DETECTED NOT DETECTED Final   Candida albicans NOT DETECTED NOT DETECTED Final   Candida auris NOT DETECTED NOT DETECTED Final   Candida glabrata NOT DETECTED NOT DETECTED Final   Candida krusei NOT DETECTED NOT DETECTED Final   Candida parapsilosis NOT DETECTED NOT DETECTED Final   Candida tropicalis NOT DETECTED NOT DETECTED  Final   Cryptococcus neoformans/gattii NOT DETECTED NOT DETECTED Final   Meth resistant mecA/C and MREJ NOT DETECTED NOT DETECTED Final    Comment: Performed at [facility], [address] [place], [place] [address]  Resp Panel by RT-PCR (Flu A&B, Covid) Nasopharyngeal Swab     Status: None   Collection Time: 10/30/21 12:14 PM   Specimen: Nasopharyngeal Swab; Nasopharyngeal(NP) swabs in vial transport medium  Result Value Ref Range Status   SARS Coronavirus 2 by RT PCR NEGATIVE NEGATIVE Final    Comment: (NOTE) SARS-CoV-2 target nucleic acids are NOT DETECTED.  The SARS-CoV-2 RNA is generally detectable in upper respiratory specimens during the acute phase of infection. The lowest concentration of SARS-CoV-2 viral copies this assay can detect is 138 copies/mL. A negative result does not preclude SARS-Cov-2 infection and should not be used as the sole basis for treatment or other patient management decisions. A negative result may occur with  improper specimen collection/handling, submission of specimen other than nasopharyngeal swab, presence of viral mutation(s) within the areas targeted by this assay, and inadequate number of viral copies(<138 copies/mL). A negative result must be combined with clinical observations, patient history, and epidemiological information. The expected result is Negative.  Fact Sheet for Patients:  EntrepreneurPulse.com.au  Fact Sheet for Healthcare Providers:  IncredibleEmployment.be  This test is no t yet approved or cleared by the [place] FDA and  has been authorized for detection and/or diagnosis of SARS-CoV-2 by FDA under an Emergency Use Authorization (EUA). This EUA will remain  in effect (meaning this test can be used) for the duration of the COVID-19 declaration under Section 564(b)(1) of the Act, 21 U.S.C.section 360bbb-3(b)(1), unless the authorization is terminated  or revoked sooner.        Influenza A by PCR NEGATIVE NEGATIVE Final   Influenza B by PCR NEGATIVE NEGATIVE Final    Comment: (NOTE) The Xpert Xpress SARS-CoV-2/FLU/RSV plus assay is intended as an aid in the diagnosis of influenza from Nasopharyngeal swab specimens and should not be used as a sole basis for treatment. Nasal washings and aspirates are unacceptable for Xpert Xpress SARS-CoV-2/FLU/RSV testing.  Fact Sheet for Patients: EntrepreneurPulse.com.au  Fact Sheet for Healthcare Providers: IncredibleEmployment.be  This test is not yet approved or cleared by the [place] FDA and has been authorized for detection and/or diagnosis of SARS-CoV-2 by FDA under an Emergency Use Authorization (EUA). This EUA will remain in effect (meaning this test can be used) for the duration of the COVID-19 declaration under Section 564(b)(1) of the Act, 21 U.S.C. section 360bbb-3(b)(1), unless the authorization is terminated or revoked.  Performed at [facility], [address] [place], [place] [address]   Urine Culture     Status: Abnormal   Collection Time: 10/31/21 10:00 AM   Specimen: Urine, Random  Result Value Ref Range Status  Specimen Description   Final    URINE, RANDOM Performed at [facility], [address], [place], [place] [address]    Special Requests   Final    NONE Performed at [facility], [address] [place], [place] [address]    Culture (A)  Final    <10,000 COLONIES/mL INSIGNIFICANT GROWTH Performed at [place] [address] [place], [place] [address]    Report Status 11/02/2021 FINAL  Final  Resp Panel by RT-PCR (Flu A&B, Covid) Nasopharyngeal Swab     Status: None   Collection Time: 10/31/21 10:11 AM   Specimen: Nasopharyngeal Swab; Nasopharyngeal(NP) swabs in vial transport medium  Result Value Ref Range Status   SARS Coronavirus 2 by RT PCR NEGATIVE NEGATIVE Final    Comment:  (NOTE) SARS-CoV-2 target nucleic acids are NOT DETECTED.  The SARS-CoV-2 RNA is generally detectable in upper respiratory specimens during the acute phase of infection. The lowest concentration of SARS-CoV-2 viral copies this assay can detect is 138 copies/mL. A negative result does not preclude SARS-Cov-2 infection and should not be used as the sole basis for treatment or other patient management decisions. A negative result may occur with  improper specimen collection/handling, submission of specimen other than nasopharyngeal swab, presence of viral mutation(s) within the areas targeted by this assay, and inadequate number of viral copies(<138 copies/mL). A negative result must be combined with clinical observations, patient history, and epidemiological information. The expected result is Negative.  Fact Sheet for Patients:  EntrepreneurPulse.com.au  Fact Sheet for Healthcare Providers:  IncredibleEmployment.be  This test is no t yet approved or cleared by the [place] FDA and  has been authorized for detection and/or diagnosis of SARS-CoV-2 by FDA under an Emergency Use Authorization (EUA). This EUA will remain  in effect (meaning this test can be used) for the duration of the COVID-19 declaration under Section 564(b)(1) of the Act, 21 U.S.C.section 360bbb-3(b)(1), unless the authorization is terminated  or revoked sooner.       Influenza A by PCR NEGATIVE NEGATIVE Final   Influenza B by PCR NEGATIVE NEGATIVE Final    Comment: (NOTE) The Xpert Xpress SARS-CoV-2/FLU/RSV plus assay is intended as an aid in the diagnosis of influenza from Nasopharyngeal swab specimens and should not be used as a sole basis for treatment. Nasal washings and aspirates are unacceptable for Xpert Xpress SARS-CoV-2/FLU/RSV testing.  Fact Sheet for Patients: EntrepreneurPulse.com.au  Fact Sheet for Healthcare  Providers: IncredibleEmployment.be  This test is not yet approved or cleared by the [place] FDA and has been authorized for detection and/or diagnosis of SARS-CoV-2 by FDA under an Emergency Use Authorization (EUA). This EUA will remain in effect (meaning this test can be used) for the duration of the COVID-19 declaration under Section 564(b)(1) of the Act, 21 U.S.C. section 360bbb-3(b)(1), unless the authorization is terminated or revoked.  Performed at [facility], [address] [place], [place] [address]   Blood Culture (routine x 2)     Status: Abnormal   Collection Time: 10/31/21 10:13 AM   Specimen: BLOOD  Result Value Ref Range Status   Specimen Description   Final    BLOOD RIGHT WRIST Performed at [facility], [address] [place], [place] [address]    Special Requests   Final    BOTTLES DRAWN AEROBIC AND ANAEROBIC Blood Culture adequate volume Performed at [facility] [facility], [address], [place], [place] [address]    Culture  Setup Time  Final    GRAM POSITIVE COCCI IN BOTH AEROBIC AND ANAEROBIC BOTTLES CRITICAL RESULT CALLED TO, READ BACK BY AND VERIFIED WITH: SHEEMA HALLAGI AT 1900 ON 10/31/21 BY SS    Culture (A)  Final    STAPHYLOCOCCUS AUREUS SUSCEPTIBILITIES PERFORMED ON PREVIOUS CULTURE WITHIN THE LAST 5 DAYS. Performed at [facility], [place] [address], [place], [place] [address]    Report Status 11/02/2021 FINAL  Final  Blood Culture (routine x 2)     Status: Abnormal   Collection Time: 10/31/21 10:13 AM   Specimen: BLOOD  Result Value Ref Range Status   Specimen Description   Final    BLOOD LEFT WRIST Performed at [facility], [address], [place], [place] [address]    Special Requests   Final    BOTTLES DRAWN AEROBIC AND ANAEROBIC BCAV Performed at [facility], [address] [place], [place] [address]    Culture  Setup Time   Final    GRAM POSITIVE  COCCI IN BOTH AEROBIC AND ANAEROBIC BOTTLES CRITICAL VALUE NOTED.  VALUE IS CONSISTENT WITH PREVIOUSLY REPORTED AND CALLED VALUE. Performed at [facility], [address] [place], [place] [address]    Culture (A)  Final    STAPHYLOCOCCUS AUREUS SUSCEPTIBILITIES PERFORMED ON PREVIOUS CULTURE WITHIN THE LAST 5 DAYS. Performed at [facility], [place] [address] [place], [place] [address]    Report Status 11/02/2021 FINAL  Final  Blood Culture ID Panel (Reflexed)     Status: Abnormal   Collection Time: 10/31/21 10:13 AM  Result Value Ref Range Status   Enterococcus faecalis NOT DETECTED NOT DETECTED Final   Enterococcus Faecium NOT DETECTED NOT DETECTED Final   Listeria monocytogenes NOT DETECTED NOT DETECTED Final   Staphylococcus species DETECTED (A) NOT DETECTED Final    Comment: CRITICAL RESULT CALLED TO, READ BACK BY AND VERIFIED WITH: SHEEMA HALLAGI AT 1900 ON 10/31/21 BY SS    Staphylococcus aureus (BCID) DETECTED (A) NOT DETECTED Final    Comment: CRITICAL RESULT CALLED TO, READ BACK BY AND VERIFIED WITH: SHEEMA HALLAGI AT 1900 ON 10/31/21 BY SS    Staphylococcus epidermidis NOT DETECTED NOT DETECTED Final   Staphylococcus lugdunensis NOT DETECTED NOT DETECTED Final   Streptococcus species NOT DETECTED NOT DETECTED Final   Streptococcus agalactiae NOT DETECTED NOT DETECTED Final   Streptococcus pneumoniae NOT DETECTED NOT DETECTED Final   Streptococcus pyogenes NOT DETECTED NOT DETECTED Final   A.calcoaceticus-baumannii NOT DETECTED NOT DETECTED Final   Bacteroides fragilis NOT DETECTED NOT DETECTED Final   Enterobacterales NOT DETECTED NOT DETECTED Final   Enterobacter cloacae complex NOT DETECTED NOT DETECTED Final   Escherichia coli NOT DETECTED NOT DETECTED Final   Klebsiella aerogenes NOT DETECTED NOT DETECTED Final   Klebsiella oxytoca NOT DETECTED NOT DETECTED Final   Klebsiella pneumoniae NOT DETECTED NOT DETECTED Final   Proteus species NOT DETECTED  NOT DETECTED Final   Salmonella species NOT DETECTED NOT DETECTED Final   Serratia marcescens NOT DETECTED NOT DETECTED Final   Haemophilus influenzae NOT DETECTED NOT DETECTED Final   Neisseria meningitidis NOT DETECTED NOT DETECTED Final   Pseudomonas aeruginosa NOT DETECTED NOT DETECTED Final   Stenotrophomonas maltophilia NOT DETECTED NOT DETECTED Final   Candida albicans NOT DETECTED NOT DETECTED Final   Candida auris NOT DETECTED NOT DETECTED Final   Candida glabrata NOT DETECTED NOT DETECTED Final   Candida krusei NOT DETECTED NOT DETECTED Final   Candida parapsilosis NOT DETECTED NOT DETECTED  Final   Candida tropicalis NOT DETECTED NOT DETECTED Final   Cryptococcus neoformans/gattii NOT DETECTED NOT DETECTED Final   Meth resistant mecA/C and MREJ NOT DETECTED NOT DETECTED Final    Comment: Performed at [facility], [address] [place], [place] [address]  CULTURE, BLOOD (ROUTINE X 2) w Reflex to ID Panel     Status: None (Preliminary result)   Collection Time: 10/17/2021  3:42 AM   Specimen: BLOOD  Result Value Ref Range Status   Specimen Description BLOOD LEFT HAND  Final   Special Requests   Final    BOTTLES DRAWN AEROBIC AND ANAEROBIC Blood Culture results may not be optimal due to an excessive volume of blood received in culture bottles   Culture   Final    NO GROWTH < 12 HOURS Performed at [facility], [address] [place], [place] [address]    Report Status PENDING  Incomplete  CULTURE, BLOOD (ROUTINE X 2) w Reflex to ID Panel     Status: None (Preliminary result)   Collection Time: 10/23/2021  3:48 AM   Specimen: BLOOD  Result Value Ref Range Status   Specimen Description BLOOD LEFT HAND  Final   Special Requests   Final    BOTTLES DRAWN AEROBIC AND ANAEROBIC Blood Culture results may not be optimal due to an excessive volume of blood received in culture bottles   Culture   Final    NO GROWTH < 12 HOURS Performed at [facility],  [address], [place],  [address]    Report Status PENDING  Incomplete         Radiology Studies: ECHO TEE  Result Date: 11/03/2021    TRANSESOPHOGEAL ECHO REPORT   Patient Name:   Nathan Singleton Date of Exam: 11/03/2021 Medical Rec #:  [id number]      Height:       76.0 in Accession #:    [id number]     Weight:       219.4 lb Date of Birth:  1941-01-19      BSA:          2.304 m Patient Age:    33 years       BP:           130/62 mmHg Patient Gender: M              HR:           64 bpm. Exam Location:  [facility] Procedure: Transesophageal Echo, Cardiac Doppler and Color Doppler Indications:     Bacteremia R783.81  History:         Patient has prior history of Echocardiogram examinations, most                  recent 10/31/2021. CAD, Pacemaker; Risk Factors:Hypertension                  and Diabetes. S/P TAVR.  Sonographer:     Sherrie Sport Referring Phys:  [id number] [place] Diagnosing Phys: Kate Sable MD PROCEDURE: The transesophogeal probe was passed without difficulty through the esophogus of the patient. Sedation performed by performing physician. The patient developed no complications during the procedure. IMPRESSIONS  1. Left ventricular ejection fraction, by estimation, is 55 to 60%. The left ventricle has normal function.  2. Right ventricular systolic function is normal. The right ventricular size is normal.  3. No left atrial/left atrial appendage thrombus was detected.  4. The mitral valve is  normal in structure. Mild mitral valve regurgitation.  5. The aortic valve has been repaired/replaced. Aortic valve regurgitation is not visualized. Echo findings are consistent with normal structure and function of the aortic valve prosthesis. Conclusion(s)/Recommendation(s): No evidence of vegetation/infective endocarditis on this transesophageael echocardiogram. FINDINGS  Left Ventricle: Left ventricular ejection fraction, by estimation, is 55 to 60%. The left ventricle has normal function.  The left ventricular internal cavity size was normal in size. Right Ventricle: The right ventricular size is normal. No increase in right ventricular wall thickness. Right ventricular systolic function is normal. Left Atrium: Left atrial size was normal in size. No left atrial/left atrial appendage thrombus was detected. Right Atrium: Right atrial size was normal in size. Pericardium: There is no evidence of pericardial effusion. Mitral Valve: The mitral valve is normal in structure. Mild mitral valve regurgitation. Tricuspid Valve: The tricuspid valve is normal in structure. Tricuspid valve regurgitation is mild. Aortic Valve: The aortic valve has been repaired/replaced. Aortic valve regurgitation is not visualized. There is a Sapien prosthetic, stented (TAVR) valve present in the aortic position. Echo findings are consistent with normal structure and function of  the aortic valve prosthesis. Pulmonic Valve: The pulmonic valve was grossly normal. Pulmonic valve regurgitation is not visualized. Aorta: The aortic root is normal in size and structure. IAS/Shunts: No atrial level shunt detected by color flow Doppler. Additional Comments: A device lead is visualized. Kate Sable MD Electronically signed by Kate Sable MD Signature Date/Time: 11/03/2021/2:39:53 PM    Final         Scheduled Meds:  allopurinol  100 mg Oral Daily   aspirin EC  81 mg Oral Daily   carvedilol  6.25 mg Oral BID   gabapentin  100 mg Oral Daily   insulin aspart  0-9 Units Subcutaneous TID WC   insulin glargine-yfgn  11 Units Subcutaneous QHS   mometasone-formoterol  2 puff Inhalation BID   pantoprazole  40 mg Oral Daily   saccharomyces boulardii  250 mg Oral BID   sertraline  200 mg Oral Daily   sucralfate  1 g Oral QID   tamsulosin  0.4 mg Oral Daily   Continuous Infusions:   ceFAZolin (ANCEF) IV 2 g (11/05/21 0605)     LOS: 1 day   The patient is critically ill with multiple organ systems failure and  requires high complexity decision making for assessment and support, frequent evaluation and titration of therapies, application of advanced monitoring technologies and extensive interpretation of multiple databases. Critical Care Time devoted to patient care services described in this note  Time spent: 40 minutes     Cayci Mcnabb, Geraldo Docker, MD [facility]   If 7PM-7AM, please contact night-coverage 11/05/2021, 7:30 AM

## 2021-11-05 NOTE — Consult Note (Signed)
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 The two initial episodes of bacteremia attributed to right foot infection, the source of this episode is unclear but suspected 2/2 pacemaker. He was transferred to [facility] for pacemaker extraction. 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  -This is the 3rd episode of MSSA bacteremia, Prior episodes were attributed to right foot infection, no clear source identified this episode but suspect pacemaker is likely infected. Scheduled for extraction on 11/25.  Recommendations:  -Continue cefazolin -In the setting of retained TAVR will need suppressive antibiotics after completion of atleast 6 weeks of IV cefazolin -Agree with pacemaker extraction, please obtain pocket and lead Cx -Continue to follow Blood Cx(11/21 no growth till date) Microbiology:   Antibiotics 11/18-P cefazolin  Blood  Cx: 11/17 MSSA 11/18 MSSA 11/21 NGTD  Urine Cx 11/18: Urine Cx           HPI: Nathan Singleton is a 80 y.o. male with CAD, DM, CKD Stage III, AS SP TAVR in 2019, pacemaker, two prior episodes of MSSA bacteremia this year 2/2 right foot infections with 1st episode 2/2 great toe infection SP amputation  treated with cefazolin x 6 weeks + keflex x 2 weeks and 2nd episode 2/2 second right toe SP amputation and  treatment with 6 weeks of cefazolin  transferred from [facility] for pacemaker extraction for 3rd episode of MSSA bacteremia suspected 2/2 pacemaker infection.  He had presented to [facility] ED on 11/17  with b/l fore-arm skin tears , discharged on keflex. Recalled to ED as blood Cx returned positive for GPC. ID was consulted for recurrent bacteremia. TEE on 11/03/21 showed no vegetation. TEE showed calcified lesion on PPM wire which had been present on previous TEE April , 2022(first episode of MSSA this year). Today, wife is at bedside. He reports prior to this admission  when he had palpitation the  pacemaker felt "hot". He reports this has not happened during prior episode of bacteremia. He denies any tenderness and erythema around the pacemaker site.    Review of Systems: Review of Systems  All other systems reviewed and are negative.  Past Medical History:  Diagnosis Date   Aneurysm (arteriovenous) of coronary vessels    Arthritis    Ascending aortic aneurysm    CAD (coronary artery disease)    Diabetes mellitus without complication ([place])  Hard of hearing    History of GI bleed    Hyperlipidemia associated with type 2 diabetes mellitus ([place])    Hypertension associated with diabetes ([place])    Presence of permanent cardiac pacemaker    PVD (peripheral vascular disease) (HCC)    S/P TAVR (transcatheter aortic valve replacement)     Social History   Tobacco Use   Smoking status: Former   Smokeless tobacco: Never  Vaping Use   Vaping Use: Never used  Substance Use Topics    Alcohol use: Not Currently   Drug use: Not Currently    Family History  Problem Relation Age of Onset   Heart disease Mother    Alzheimer's disease Mother    Heart disease Father    Diabetes Father    Scheduled Meds:  sodium chloride   Intravenous Once   allopurinol  100 mg Oral Daily   aspirin EC  81 mg Oral Daily   carvedilol  6.25 mg Oral BID   gabapentin  100 mg Oral Daily   insulin aspart  0-9 Units Subcutaneous TID WC   insulin glargine-yfgn  11 Units Subcutaneous QHS   mometasone-formoterol  2 puff Inhalation BID   pantoprazole  40 mg Oral BID   rosuvastatin  40 mg Oral QHS   saccharomyces boulardii  250 mg Oral BID   sertraline  200 mg Oral Daily   sodium chloride flush  3 mL Intravenous Q12H   sucralfate  1 g Oral QID   tamsulosin  0.4 mg Oral Daily   Continuous Infusions:  sodium chloride 75 mL/hr at 11/05/21 1810    ceFAZolin (ANCEF) IV 2 g (11/05/21 2146)   PRN Meds:.acetaminophen **OR** acetaminophen, albuterol Allergies  Allergen Reactions   Atorvastatin Other (See Comments)    "bad for kidneys" Cramping    Celecoxib Other (See Comments)    Other reaction(s): Other (See Comments) Kidney Problem Kidney Problem    Dicyclomine Hcl Other (See Comments)    Stomach cramps   Doxycycline Other (See Comments)    Per wife (per RN)- pt gets severe stomach pains even when takes with food and PCP said to not take     Metformin Other (See Comments)    OBJECTIVE: Blood pressure (!) 101/45, pulse (!) 43, temperature 98.2 F (36.8 C), temperature source Oral, resp. rate 14, height 6\' 4"  (1.93 m), weight 104.2 kg, SpO2 99 %.  Physical Exam Constitutional:      General: He is not in acute distress.    Appearance: He is normal weight. He is not toxic-appearing.  HENT:     Head: Normocephalic and atraumatic.     Right Ear: External ear normal.     Left Ear: External ear normal.     Nose: No congestion or rhinorrhea.     Mouth/Throat:     Mouth: Mucous  membranes are moist.     Pharynx: Oropharynx is clear.  Eyes:     Extraocular Movements: Extraocular movements intact.     Conjunctiva/sclera: Conjunctivae normal.     Pupils: Pupils are equal, round, and reactive to light.  Cardiovascular:     Rate and Rhythm: Normal rate and regular rhythm.     Heart sounds: No murmur heard.   No friction rub. No gallop.     Comments: Pace maker site non-tender, no erythema Pulmonary:     Effort: Pulmonary effort is normal.     Breath sounds: Normal breath sounds.  Abdominal:     General: Abdomen is  flat. Bowel sounds are normal.     Palpations: Abdomen is soft.  Musculoskeletal:        General: No swelling. Normal range of motion.     Cervical back: Normal range of motion and neck supple.     Comments: Right toe amputations   Skin:    General: Skin is warm and dry.  Neurological:     General: No focal deficit present.     Mental Status: He is oriented to person, place, and time.  Psychiatric:        Mood and Affect: Mood normal.    Lab Results Lab Results  Component Value Date   WBC 3.6 (L) 11/05/2021   HGB 7.9 (L) 11/05/2021   HCT 22.9 (L) 11/05/2021   MCV 90.9 11/05/2021   PLT 40 (L) 11/05/2021    Lab Results  Component Value Date   CREATININE 1.75 (H) 11/05/2021   BUN 50 (H) 11/05/2021   NA 132 (L) 11/05/2021   K 3.0 (L) 11/05/2021   CL 104 11/05/2021   CO2 18 (L) 11/05/2021    Lab Results  Component Value Date   ALT 13 11/05/2021   AST 75 (H) 11/05/2021   ALKPHOS 83 11/05/2021   BILITOT 0.6 11/05/2021       Laurice Record, [facility] 11/05/2021, 10:02 PM

## 2021-11-05 NOTE — Consult Note (Addendum)
Cardiology Consultation:   Patient ID: Nathan Singleton MRN: [id number]; DOB: [birth year]  Admit date: 10/25/2021 Date of Consult: 11/05/2021  PCP:  Ranae Plumber, [facility] Cardiologist:  Dr. Agustin Cree   Patient Profile:   Nathan Singleton is a 80 y.o. 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 Sherral Hammers.  History of Present Illness:   Nathan Singleton saw Dr. Agustin Cree for the first (and last) time 11/19/20 to establish cardiology care locally having just moved to [facility].   Most recent cardiac cath on 12/12/2019 showed stable, mild nonobstructive disease and patent previously placed proximal RCA stent with mild in-stent restenosis.  Nuclear stress test on 06/15/2020 showed no perfusion defect with an EF of 63%.  He has been struggling with PVD and nonhealing toe/foot ulcers vascular intervention undertaken to R SFA and dorsalis pedis and distal anterior tibial arteries  though still required toe amputations. Since then he has had a number of hospitalizations with MSSA bacteremia, TTE and TEEs have been neg for endocarditis and treated each time with 6 weeks of ABX  He was re-admitted to [facility] with malaise, fatigue and fever, admitted with sepsis and recurrent MSSA bacteremia.  ID on board as well as cardiology and planned to transfer to [facility] for EP to see and consider pacemaker system extraction  TEE 11/03/21 with LVEF 55-60%TAVR w/normal function  LAST dose of Plavix and SQ  heparin (held for down trending plts) was 11/01/21  Labs  K+ 3.0 (replacement already ordered) BUN/Creat 50/1.75 (1.2 appears his baseline creat) WBC 3.6 H/H 7.9/22.9 this admission Hgb trend is a slow downward trend (baseline Hgb this year appears to be 8-9s range PLTS 40 this admission trending downward (110's-120's this year)  BP stable remains intermittently febrile 101 last night  Device information Abbott dual chamber PPM implanted 04/25/2018 Assurity and (2) Tendril 2088, all from same date Interrogation done today AP 12% VP 9.7% (with a recent up-tick in V pacing) Presenting AS/VP Off pacing he AS/VS with PR 373ms Programmed DDI 40  Since programming he has had a few episodes of pacing at 40 though largely conducting 70's    Past Medical History:  Diagnosis Date   Aneurysm (arteriovenous) of coronary vessels    Arthritis    Ascending aortic aneurysm    CAD (coronary artery disease)    Diabetes mellitus without complication (HCC)    Hard of hearing    History of GI bleed    Hyperlipidemia associated with type 2 diabetes mellitus ([place])    Hypertension associated with diabetes ([place])    Presence of permanent cardiac pacemaker    PVD (peripheral vascular disease) ([place])    S/P TAVR (transcatheter aortic valve replacement)     Past Surgical History:  Procedure Laterality Date   AMPUTATION TOE Right 03/20/2021   Procedure: AMPUTATION TOE;  Surgeon: Sharlotte Alamo, DPM;  Location: [facility] ORS;  Service: Podiatry;  Laterality: Right;   AMPUTATION TOE Right 06/03/2021   Procedure: AMPUTATION TOE-SECOND TOE;  Surgeon: Vickki Muff,  Larkin Ina, DPM;  Location: [facility] ORS;  Service: Podiatry;  Laterality: Right;   AMPUTATION TOE Right 07/26/2021   Procedure: AMPUTATION TOE-THIRD TOE;  Surgeon: [name], DPM;  Location: [facility] ORS;  Service: Podiatry;  Laterality: Right;   Ankle repaired Right    Carpal Tunnel repaired Left    CATARACT EXTRACTION Bilateral    COLONOSCOPY     Copillar  implant      DG CHOLECYSTOGRAPHY GALL BLADDER ([facility] HX)     GASTRIC BYPASS     heart valve replaced     IRRIGATION AND DEBRIDEMENT FOOT Right 02/18/2021   Procedure: IRRIGATION AND DEBRIDEMENT FOOT-Right Great Toe;  Surgeon: [name], DPM;  Location: [facility] ORS;  Service: Podiatry;  Laterality: Right;   LOWER EXTREMITY ANGIOGRAPHY Right 02/17/2021   Procedure: Lower Extremity Angiography;  Surgeon: Algernon Huxley, MD;  Location: [place] [facility];  Service: Cardiovascular;  Laterality: Right;   LOWER EXTREMITY ANGIOGRAPHY Right 03/27/2021   Procedure: Lower Extremity Angiography;  Surgeon: Algernon Huxley, MD;  Location: [place] [facility];  Service: Cardiovascular;  Laterality: Right;   PACEMAKER GENERATOR CHANGE     REPLACEMENT TOTAL KNEE BILATERAL     RTC     STENT PLACE LEFT URETER ([place] HX) Right    Right Leg   TEE WITHOUT CARDIOVERSION N/A 03/26/2021   Procedure: TRANSESOPHAGEAL ECHOCARDIOGRAM (TEE);  Surgeon: Kate Sable, MD;  Location: [facility] ORS;  Service: Cardiovascular;  Laterality: N/A;   TEE WITHOUT CARDIOVERSION N/A 06/04/2021   Procedure: TRANSESOPHAGEAL ECHOCARDIOGRAM (TEE);  Surgeon: Kate Sable, MD;  Location: [facility] ORS;  Service: Cardiovascular;  Laterality: N/A;   TEE WITHOUT CARDIOVERSION N/A 11/03/2021   Procedure: TRANSESOPHAGEAL ECHOCARDIOGRAM (TEE);  Surgeon: Wellington Hampshire, MD;  Location: [facility] ORS;  Service: Cardiovascular;  Laterality: N/A;   Toe nail removed Bilateral      Home Medications:  Prior to Admission medications   Medication Sig Start Date End Date Taking? Authorizing Provider  acetaminophen (TYLENOL) 325 MG tablet Take 2 tablets (650 mg total) by mouth every 6 (six) hours as needed for fever, headache or moderate pain. 05/09/21  Yes Loletha Grayer, MD  albuterol (VENTOLIN HFA) 108 (90 Base) MCG/ACT inhaler Inhale 2 puffs into the lungs every 4 (four) hours as needed for wheezing or shortness of breath. 05/09/21  Yes Wieting, Richard, MD   allopurinol (ZYLOPRIM) 100 MG tablet Take 100 mg by mouth daily. 10/19/20  Yes [provider]  amLODipine (NORVASC) 10 MG tablet Take 10 mg by mouth daily.   Yes [provider]  aspirin 81 MG EC tablet Take 81 mg by mouth daily.   Yes [provider]  budesonide-formoterol (SYMBICORT) 80-4.5 MCG/ACT inhaler Inhale 2 puffs into the lungs daily as needed (wheezing).   Yes [provider]  CALCIUM CITRATE+D3 PETITES 200-250 MG-UNIT TABS Take 1 tablet by mouth daily.   Yes [provider]  carvedilol (COREG) 6.25 MG tablet Take 6.25 mg by mouth 2 (two) times daily. 07/08/21  Yes [provider]  Cholecalciferol (VITAMIN D3) 125 MCG (5000 UT) CAPS Take 5,000 Units by mouth daily. 05/09/21  Yes [provider]  clopidogrel (PLAVIX) 75 MG tablet Take 75 mg by mouth daily.   Yes [provider]  docusate sodium (COLACE) 100 MG capsule Take 200 mg by mouth 2 (two) times daily.   Yes [provider]  gabapentin (NEURONTIN) 100 MG capsule Take 100 mg by mouth daily. 01/29/21  Yes [provider]  HYDROcodone-acetaminophen (NORCO/VICODIN)  5-325 MG tablet Take 1 tablet by mouth every 6 (six) hours as needed for moderate pain.   Yes [provider]  insulin glargine (LANTUS) 100 UNIT/ML injection Inject 22 Units into the skin at bedtime.   Yes [provider]  isosorbide mononitrate (IMDUR) 30 MG 24 hr tablet Take 30 mg by mouth daily.   Yes [provider]  lactulose, encephalopathy, (CHRONULAC) 10 GM/15ML SOLN Take 15 mLs by mouth daily as needed (constipation). 10/08/21  Yes [provider]  liraglutide (VICTOZA) 18 MG/3ML SOPN Inject 1.8 mg into the skin daily.   Yes [provider]  Multiple Vitamin (DAILY-VITE MULTIVITAMIN PO) Take 1 tablet by mouth daily.   Yes [provider]  multivitamin (ONE-A-DAY MEN'S) TABS tablet Take 1 tablet by mouth daily. 11/11/20  Yes  [provider]  pantoprazole (PROTONIX) 40 MG tablet Take 1 tablet (40 mg total) by mouth daily. 02/11/21  Yes [name], MD  rosuvastatin (CRESTOR) 40 MG tablet Take 40 mg by mouth daily. 02/13/21  Yes [provider]  saccharomyces boulardii (FLORASTOR) 250 MG capsule Take 1 capsule (250 mg total) by mouth 2 (two) times daily. Patient taking differently: Take 250 mg by mouth daily. 10/18/2021  Yes Lavina Hamman, MD  sertraline (ZOLOFT) 100 MG tablet Take 100 mg by mouth daily. 11/11/20  Yes [provider]  sucralfate (CARAFATE) 1 g tablet Take 1 g by mouth 4 (four) times daily. 10/08/21  Yes [provider]  tamsulosin (FLOMAX) 0.4 MG CAPS capsule Take 0.4 mg by mouth daily. 10/07/21  Yes [provider]    Inpatient Medications: Scheduled Meds:  white petrolatum       allopurinol  100 mg Oral Daily   aspirin EC  81 mg Oral Daily   carvedilol  6.25 mg Oral BID   gabapentin  100 mg Oral Daily   insulin aspart  0-9 Units Subcutaneous TID WC   insulin glargine-yfgn  11 Units Subcutaneous QHS   mometasone-formoterol  2 puff Inhalation BID   pantoprazole  40 mg Oral Daily   potassium chloride  50 mEq Oral BID   rosuvastatin  40 mg Oral QHS   saccharomyces boulardii  250 mg Oral BID   sertraline  200 mg Oral Daily   sucralfate  1 g Oral QID   tamsulosin  0.4 mg Oral Daily   Continuous Infusions:   ceFAZolin (ANCEF) IV 2 g (11/05/21 0605)   PRN Meds: acetaminophen **OR** acetaminophen, albuterol  Allergies:    Allergies  Allergen Reactions   Atorvastatin Other (See Comments)    "bad for kidneys" Cramping    Celecoxib Other (See Comments)    Other reaction(s): Other (See Comments) Kidney Problem Kidney Problem    Dicyclomine Hcl Other (See Comments)    Stomach cramps   Doxycycline Other (See Comments)    Per wife (per RN)- pt gets severe stomach pains even when takes with food and PCP said to not take     Metformin Other (See  Comments)    Social History:   Social History   Socioeconomic History   Marital status: Single    Spouse name: Not on file   Number of children: Not on file   Years of education: Not on file   Highest education level: Not on file  Occupational History   Not on file  Tobacco Use   Smoking status: Former   Smokeless tobacco: Never  Vaping Use   Vaping Use: Never used  Substance and Sexual Activity   Alcohol use: Not Currently   Drug use: Not Currently   Sexual activity: Not on file  Other Topics Concern   Not on file  Social History Narrative   Not on file   Social Determinants of Health   Financial Resource Strain: Not on file  Food Insecurity: Not on file  Transportation Needs: Not on file  Physical Activity: Not on file  Stress: Not on file  Social Connections: Not on file  Intimate Partner Violence: Not on file    Family History:   Family History  Problem Relation Age of Onset   Heart disease Mother    Alzheimer's disease Mother    Heart disease Father    Diabetes Father      ROS:  Please see the history of present illness.  All other ROS reviewed and negative.     Physical Exam/Data:   Vitals:   11/05/21 0215 11/05/21 0352 11/05/21 0500 11/05/21 0812  BP:  (!) 122/58  123/67  Pulse:  68  70  Resp:    18  Temp:  98.7 F (37.1 C)  (!) 100.9 F (38.3 C)  TempSrc:  Oral  Oral  SpO2:  98%  98%  Weight:   104.2 kg   Height: 6\' 4"  (1.93 m)       Intake/Output Summary (Last 24 hours) at 11/05/2021 1154 Last data filed at 11/05/2021 0554 Gross per 24 hour  Intake 200 ml  Output 750 ml  Net -550 ml   Last 3 Weights 11/05/2021 11/03/2021 10/31/2021  Weight (lbs) 229 lb 12.8 oz 219 lb 5.7 oz 219 lb 5.7 oz  Weight (kg) 104.237 kg 99.5 kg 99.5 kg     Body mass index is 27.97 kg/m.  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 Left ventricular ejection fraction, by estimation, is 55 to 60%. The  left ventricle has normal function.   2. Right ventricular systolic function is normal. The right ventricular  size is normal.   3. No left atrial/left atrial appendage thrombus was detected.   4. The mitral valve is normal in structure. Mild mitral valve  regurgitation.   5. The aortic valve has been repaired/replaced. Aortic valve  regurgitation is not visualized. Echo findings are consistent with normal  structure and function of the aortic valve prosthesis.   Conclusion(s)/Recommendation(s): No evidence of vegetation/infective  endocarditis on this transesophageael echocardiogram.    Laboratory Data:  High Sensitivity Troponin:   Recent Labs  Lab 10/17/21 0726 10/17/21 1112 10/17/21 1305 10/31/21 1210 10/31/21 1502  TROPONINIHS 94* 89* 81* 277* 286*     Chemistry Recent Labs  Lab 11/02/21 0553 11/03/21 0536 11/05/21 0142  NA 132* 135 132*  K 3.4* 3.3* 3.0*  CL 104 104 104  CO2 23 22 18*  GLUCOSE 108*  174* 153*  BUN 47* 44* 50*  CREATININE 1.64* 1.59* 1.75*  CALCIUM 7.8* 7.8* 7.5*  MG  --   --  1.7  GFRNONAA 42* 44* 39*  ANIONGAP 5 9 10     Recent Labs  Lab 11/01/21 0736 11/02/21 0553 11/05/21 0142  PROT 5.4* 5.7* 4.6*  ALBUMIN 2.5* 2.7* 2.0*  AST 219* 191* 75*  ALT 141* 75* 13  ALKPHOS 60 65 83  BILITOT 0.5 0.3 0.6   Lipids No results for input(s): CHOL, TRIG, HDL, LABVLDL,  LDLCALC, CHOLHDL in the last 168 hours.  Hematology Recent Labs  Lab 11/03/21 0536 10/20/2021 1121 11/05/21 0142  WBC 4.2 4.0 3.6*  RBC 2.82* 2.62* 2.52*  HGB 8.6* 8.3* 7.9*  HCT 25.2* 23.9* 22.9*  MCV 89.4 91.2 90.9  MCH 30.5 31.7 31.3  MCHC 34.1 34.7 34.5  RDW 13.5 13.9 13.9  PLT 43* 42* 40*   Thyroid No results for input(s): TSH, FREET4 in the last 168 hours.  BNPNo results for input(s): BNP, PROBNP in the last 168 hours.  DDimer No results for input(s): DDIMER in the last 168 hours.   Radiology/Studies:     Assessment and Plan:   Recurrent MSSA bacteremia/sepsis Most likely 2/2 tow/foot infections though PPM system by default is infected  He is scheduled for system extraction Friday afternoon  Thrombocytopenia Plavix and SQ heparin held, last dose 11/19, plts have been stable since Anemia Described as anemia of chronic disease, not felt to be acutely bleeding  NO PLAVIX SCD for DVT prophylaxis please  The patient in [place] was seeing a hematologist and was at some point getting iron infusions, they tell me that he is also known to have low platelets They say they were told that he is likely bleeding slowly from somewhere though unable to find where  T&C today Transfuse 2U PRBC tomorrow 2units to be on hold for OR Friday  He will need platelets I D/w blood back They requested that the platelets be ordered FRIDAY AM to be given on call to the [place] [facility]:    For questions or updates, please contact [facility] Please consult [email or web address] for contact info under    Signed, Baldwin Jamaica, PA-C  11/05/2021 11:54 AM  EP Attending  Patient seen and examined. Agree with above. 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 He has undergone TEE which demonstrated no evidence of a vegetation. He is referred for evaluation of and consideration for PPM system extraction.  On exam he is a pleasant elderly man, NAD. CV with a RRR. Lungs with scattered rales. Extremities demonstrated minimal edema.  A/P PPM system infection - he will need to undergo PM system extraction. I have reviewed the indications/risk/benefits/goals/expectations and he wishes to proceed.  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  Hematologic issues - he will need blood transfusion and platelet transfusion prior to extraction.  Carleene Overlie Isiaih Hollenbach,MD

## 2021-11-06 DIAGNOSIS — E0821 Diabetes mellitus due to underlying condition with diabetic nephropathy: Secondary | ICD-10-CM

## 2021-11-06 DIAGNOSIS — E1121 Type 2 diabetes mellitus with diabetic nephropathy: Secondary | ICD-10-CM | POA: Diagnosis present

## 2021-11-06 DIAGNOSIS — R338 Other retention of urine: Secondary | ICD-10-CM

## 2021-11-06 DIAGNOSIS — E118 Type 2 diabetes mellitus with unspecified complications: Secondary | ICD-10-CM

## 2021-11-06 DIAGNOSIS — I495 Sick sinus syndrome: Secondary | ICD-10-CM

## 2021-11-06 LAB — CBC WITH DIFFERENTIAL/PLATELET
Abs Immature Granulocytes: 0.05 10*3/uL (ref 0.00–0.07)
Basophils Absolute: 0 10*3/uL (ref 0.0–0.1)
Basophils Relative: 0 %
Eosinophils Absolute: 0 10*3/uL (ref 0.0–0.5)
Eosinophils Relative: 1 %
HCT: 21.5 % — ABNORMAL LOW (ref 39.0–52.0)
Hemoglobin: 7.5 g/dL — ABNORMAL LOW (ref 13.0–17.0)
Immature Granulocytes: 1 %
Lymphocytes Relative: 10 %
Lymphs Abs: 0.5 10*3/uL — ABNORMAL LOW (ref 0.7–4.0)
MCH: 31.5 pg (ref 26.0–34.0)
MCHC: 34.9 g/dL (ref 30.0–36.0)
MCV: 90.3 fL (ref 80.0–100.0)
Monocytes Absolute: 0.4 10*3/uL (ref 0.1–1.0)
Monocytes Relative: 8 %
Neutro Abs: 4.2 10*3/uL (ref 1.7–7.7)
Neutrophils Relative %: 80 %
Platelets: 45 10*3/uL — ABNORMAL LOW (ref 150–400)
RBC: 2.38 MIL/uL — ABNORMAL LOW (ref 4.22–5.81)
RDW: 14.3 % (ref 11.5–15.5)
WBC: 5.3 10*3/uL (ref 4.0–10.5)
nRBC: 0 % (ref 0.0–0.2)

## 2021-11-06 LAB — COMPREHENSIVE METABOLIC PANEL
ALT: 9 U/L (ref 0–44)
AST: 69 U/L — ABNORMAL HIGH (ref 15–41)
Albumin: 2 g/dL — ABNORMAL LOW (ref 3.5–5.0)
Alkaline Phosphatase: 88 U/L (ref 38–126)
Anion gap: 9 (ref 5–15)
BUN: 62 mg/dL — ABNORMAL HIGH (ref 8–23)
CO2: 17 mmol/L — ABNORMAL LOW (ref 22–32)
Calcium: 7.4 mg/dL — ABNORMAL LOW (ref 8.9–10.3)
Chloride: 103 mmol/L (ref 98–111)
Creatinine, Ser: 2.39 mg/dL — ABNORMAL HIGH (ref 0.61–1.24)
GFR, Estimated: 27 mL/min — ABNORMAL LOW (ref >60)
Glucose, Bld: 201 mg/dL — ABNORMAL HIGH (ref 70–99)
Potassium: 3.9 mmol/L (ref 3.5–5.1)
Sodium: 129 mmol/L — ABNORMAL LOW (ref 135–145)
Total Bilirubin: 0.5 mg/dL (ref 0.3–1.2)
Total Protein: 4.6 g/dL — ABNORMAL LOW (ref 6.5–8.1)

## 2021-11-06 LAB — MAGNESIUM: Magnesium: 2.3 mg/dL (ref 1.7–2.4)

## 2021-11-06 LAB — PHOSPHORUS: Phosphorus: 1.9 mg/dL — ABNORMAL LOW (ref 2.5–4.6)

## 2021-11-06 LAB — GLUCOSE, CAPILLARY
Glucose-Capillary: 278 mg/dL — ABNORMAL HIGH (ref 70–99)
Glucose-Capillary: 328 mg/dL — ABNORMAL HIGH (ref 70–99)
Glucose-Capillary: 332 mg/dL — ABNORMAL HIGH (ref 70–99)

## 2021-11-06 MED ORDER — SODIUM CHLORIDE 0.9% FLUSH: 3.0000 mL | INTRAVENOUS | Status: DC | PRN

## 2021-11-06 MED ORDER — SODIUM CHLORIDE 0.9 % IV SOLN
80.0000 mg | INTRAVENOUS | Status: DC
  Filled 2021-11-06: qty 2

## 2021-11-06 MED ORDER — CHLORHEXIDINE GLUCONATE 4 % EX LIQD
60.0000 mL | Freq: Once | CUTANEOUS | Status: AC
  Administered 2021-11-06: 4 via TOPICAL

## 2021-11-06 MED ORDER — CHLORHEXIDINE GLUCONATE 4 % EX LIQD
CUTANEOUS | Status: AC
  Filled 2021-11-06: qty 15

## 2021-11-06 MED ORDER — CHLORHEXIDINE GLUCONATE CLOTH 2 % EX PADS
6.0000 | MEDICATED_PAD | Freq: Every day | CUTANEOUS | Status: DC
  Administered 2021-11-06 – 2021-11-08 (×3): 6 via TOPICAL

## 2021-11-06 MED ORDER — SODIUM PHOSPHATES 45 MMOLE/15ML IV SOLN
40.0000 mmol | Freq: Once | INTRAVENOUS | Status: DC
  Filled 2021-11-06: qty 13.33

## 2021-11-06 MED ORDER — TAMSULOSIN HCL 0.4 MG PO CAPS
0.4000 mg | ORAL_CAPSULE | Freq: Two times a day (BID) | ORAL | Status: DC
  Administered 2021-11-06 – 2021-11-09 (×5): 0.4 mg via ORAL
  Filled 2021-11-06 (×5): qty 1

## 2021-11-06 MED ORDER — INSULIN ASPART 100 UNIT/ML IJ SOLN
0.0000 [IU] | INTRAMUSCULAR | Status: DC
  Administered 2021-11-06: 15 [IU] via SUBCUTANEOUS
  Administered 2021-11-06: 11 [IU] via SUBCUTANEOUS
  Administered 2021-11-07: 8 [IU] via SUBCUTANEOUS
  Administered 2021-11-07: 5 [IU] via SUBCUTANEOUS
  Administered 2021-11-07: 3 [IU] via SUBCUTANEOUS
  Administered 2021-11-07: 11 [IU] via SUBCUTANEOUS

## 2021-11-06 MED ORDER — SODIUM CHLORIDE 0.9 % IV SOLN: 250.0000 mL | INTRAVENOUS | Status: DC

## 2021-11-06 MED ORDER — CEFAZOLIN SODIUM-DEXTROSE 2-4 GM/100ML-% IV SOLN
2.0000 g | INTRAVENOUS | Status: AC
  Administered 2021-11-07: 2 g via INTRAVENOUS

## 2021-11-06 MED ORDER — SODIUM CHLORIDE 0.9 % IV SOLN: INTRAVENOUS | Status: DC

## 2021-11-06 MED ORDER — CHLORHEXIDINE GLUCONATE 4 % EX LIQD
60.0000 mL | Freq: Once | CUTANEOUS | Status: AC
  Filled 2021-11-06: qty 60

## 2021-11-06 NOTE — Progress Notes (Signed)
PROGRESS NOTE    Nathan Singleton  JOA:[id number] DOB: Apr 17, 1941 DOA: 11/03/2021 PCP: Ranae Plumber, PA   Brief Narrative:  Nathan Singleton is a pleasant 80 y.o. WM PMHx coronary artery disease, SSS with pacemaker, AS s/p TAVR,insulin-dependent diabetes mellitus, CKD stage IIIa,  and 2 episodes of MSSA bacteremia earlier this year attributed to right foot infection and managed with second toe amputation and 6 weeks IV cefazolin, now with his third episode of MSSA bacteremia and transferred from [facility] for pacemaker extraction.    [facility] Hospital Course: Patient was seen in the [facility] ED on 10/30/2021 with fevers, had blood cultures collected, and went home with Keflex.  He continued to have fever to 103 F at home that night, the cultures started growing gram-positive cocci, and he came back into the [facility] ED the following day where he had fluid responsive hypotension and-year-old was admitted (11/18 [facility]).   ID provided consultation for the recurrent bacteremia which ended up being MSSA again, patient underwent TEE on 11/03/2021 with no evidence for endocarditis.  He was continued on IV cefazolin, transferred to [facility] for extraction of pacemaker per ID recommendation, and will need long-term suppressive therapy in light of his prosthetic AV.   He appears to have chronic intermittent thrombocytopenia, platelet count 70 on admission, then decreased to the 40 range where it remained stable for the 4 days leading up to transfer.  Plavix and pharmacologic VTE prophylaxis were held for this reason and he was continued on baby aspirin.   Troponin was elevated to the 200s on admission, he was evaluated by cardiology, had preserved EF on echo, and this was attributed to demand ischemia in the setting of sepsis with hypotension and renal insufficiency.   Transaminases were elevated during the admission with normal alkaline phosphatase and normal bilirubin.  Statin was held and transaminases have been  trending down.   He developed loose stools without abdominal pain or leukocytosis. This was felt to be related to antibiotics, laxatives were held, and probiotic started.    Subjective: 11/24 afebrile overnight    A/O x4, hard of hearing.  Negative CP, negative SOB.   Assessment & Plan:  Covid vaccination; vaccinated 3/3  Principal Problem:   MSSA bacteremia Active Problems:   Coronary artery disease involving coronary bypass graft of native heart with angina pectoris (HCC)   S/P TAVR (transcatheter aortic valve replacement)   Thrombocytopenia (HCC)   Anemia of chronic disease   CKD stage G3a/A3, GFR 45-59 and albumin creatinine ratio >300 mg/g (HCC)   Type 2 diabetes mellitus with diabetic chronic kidney disease (HCC)   CAD (coronary artery disease)   Hypokalemia   Elevated transaminase level   Sick sinus syndrome ([place])   Diabetes mellitus type 2, controlled, with complications ([place])   Diabetic nephropathy ([place])   Acute urinary retention  MSSA bacteremia  - Admitted to [facility] on 10/31/21 with 3rd episode of MSSA bacteremia in a yr, seen by ID, started on cefazolin, had no evidence for endocarditis on TEE 11/03/21, and was transferred to [facility] for pacemaker extraction  - Blood cultures repeated 10/20/2021  - Cardiology coordinating pacemaker extraction, continue cefazolin, will need long-term suppressive antibiotics per ID at [facility]   -11/23 spoke with card master patient is on their list to be seen today.  Explained to patient and wife will be up to cardiology for exact timing when they remove pacer given his bacteremia, and how long he will have to remain on antibiotics prior to them  reinserting new pacer. - 11/24 per ID note from last night on 11/23 In the setting of retained TAVR will need suppressive antibiotics after completion of at least 6 weeks of IV cefazolin -11/24 plan is for pacer removal on 11/25   Thrombocytopenia - Platelets 70 on admission, stable in 40-range for  the 4 days leading up to transfer to [facility]  - Hx of intermittent thrombocytopenia going back at least a yr   -No sign of overt bleeding - Continue to hold Plavix and pharmacologic VTE ppx, continue daily CBC   Platelets 150 - 400 K/uL 45 Low   40 Low  CM  42 Low  CM  43 Low  CM  41 Low  CM  37 Low  CM  70 Low  CM      Elevated transaminase -Resolving - Continue to hold statin   Sick sinus syndrome, s/p pacemaker -Plan extraction of pacemaker by cardiology - Per cardiology hemoglobin goal> 9 - Strict in and out +806.81ml - Daily weight  CAD - No anginal complaints  - Troponin was elevated to 200s on admission to [facility] on 11/18, seen by cardiology there, had preserved EF on echo, and was attributed to demand ishcemia in setting of bacteremia with hypotension, renal insufficiency, and anemia  - Plavix held in light of low platelets, statin on hold d/t transaminitis, continuing Coreg and ASA    AS s/p TAVR  - No evidence for endocarditis on TEE 11/03/21  - Will need long-term suppressive antibiotics per ID    DM type II controlled with complication/DM Nephropathy  - A1c was 6.9% in November 2022  -Semglee 11 units daily - 11/24 increase moderate SSI  Acute on CKD Stage IIIa (baseline Cr~1.59) - Monitor renal function closely Lab Results  Component Value Date   CREATININE 2.39 (H) 11/06/2021   CREATININE 1.75 (H) 11/05/2021   CREATININE 1.59 (H) 11/03/2021   CREATININE 1.64 (H) 11/02/2021   CREATININE 1.77 (H) 11/01/2021  -7/23 gently hydrate normal saline 35ml/hr  Acute urinary Retention - Place Foley catheter for comfort. - 11/24 increase Flomax 0.4 mg BID  Hypokalemia -Potassium goal> 4  Hypomagnesmia - Magnesium goal> 2   Hypophosphatemia - Phosphorus goal> 2.5 - Sodium phosphate IV 40 mmol  Hypocalcemia - 11/24 corrected calcium= 9.0     DVT prophylaxis: SCD Code Status: Full Family Communication: 11/23 wife at bedside for discussion of plan of care all  questions answered Status is: Inpatient    Dispo: The patient is from: Home              Anticipated d/c is to: Home              Anticipated d/c date is: 3 days              Patient currently is not medically stable to d/c.      Consultants:  Cardiology   Procedures/Significant Events:  Echo 10/31/21  1. Left ventricular ejection fraction, by estimation, is 60 to 65%. The  left ventricle has normal function. The left ventricle has no regional  wall motion abnormalities. There is mild left ventricular hypertrophy.  Left ventricular diastolic parameters  are consistent with Grade I diastolic dysfunction (impaired relaxation).   2. Right ventricular systolic function is normal. The right ventricular  size is normal. Tricuspid regurgitation signal is inadequate for assessing  PA pressure.   3. Left atrial size was mildly dilated.   4. The mitral valve is normal in structure. No  evidence of mitral valve  regurgitation. No evidence of mitral stenosis.   5. The aortic valve was not well visualized. Aortic valve regurgitation  is not visualized. No aortic stenosis is present. Echo findings are  consistent with normal structure and function of the aortic valve  prosthesis. Aortic valve mean gradient measures   6.5 mmHg.  11/21 TEE   Left Ventricle: Left ventricular ejection fraction, by estimation, is 55  to 60%. The left ventricle has normal function. The left ventricular  internal cavity size was normal in size.   Right Ventricle: The right ventricular size is normal. No increase in  right ventricular wall thickness. Right ventricular systolic function is  normal.   Left Atrium: Left atrial size was normal in size. No left atrial/left  atrial appendage thrombus was detected.   Right Atrium: Right atrial size was normal in size.   Pericardium: There is no evidence of pericardial effusion.   Mitral Valve: The mitral valve is normal in structure. Mild mitral valve   regurgitation.   Tricuspid Valve: The tricuspid valve is normal in structure. Tricuspid  valve regurgitation is mild.   Aortic Valve: The aortic valve has been repaired/replaced. Aortic valve  regurgitation is not visualized. There is a Sapien prosthetic, stented  (TAVR) valve present in the aortic position. Echo findings are consistent  with normal structure and function of   the aortic valve prosthesis.   Pulmonic Valve: The pulmonic valve was grossly normal. Pulmonic valve  regurgitation is not visualized.    I have personally reviewed and interpreted all radiology studies and my findings are as above.  VENTILATOR SETTINGS:    Cultures 11/17 blood LEFT wrist positive MSSA 11/17 blood LEFT AC positive MSSA 11/18 urine insignificant growth 11/18 blood RIGHT wrist positive MSSA 11/20 blood LEFT wrist positive MSSA  11/22 blood LEFT hand NGTD 11/22 blood LEFT hand NGTD    Antimicrobials: Anti-infectives (From admission, onward)    Start     Ordered Stop   11/01/2021 2200  ceFAZolin (ANCEF) IVPB 2g/100 mL premix        10/20/2021 2035           Devices    LINES / TUBES:      Continuous Infusions:  sodium chloride 75 mL/hr at 11/06/21 0625    ceFAZolin (ANCEF) IV 2 g (11/06/21 1329)   sodium phosphate  Dextrose 5% IVPB       Objective: Vitals:   11/06/21 0341 11/06/21 0846 11/06/21 0854 11/06/21 1143  BP: (!) 91/55  (!) 101/50 (!) 88/53  Pulse: (!) 40 (!) 40 (!) 40 (!) 40  Resp: 16 18 16 18   Temp: 98.7 F (37.1 C)  97.9 F (36.6 C) 98.6 F (37 C)  TempSrc: Oral  Oral Oral  SpO2: 100% 100% 97% 100%  Weight: 107.6 kg     Height:        Intake/Output Summary (Last 24 hours) at 11/06/2021 1547 Last data filed at 11/06/2021 1221 Gross per 24 hour  Intake 1306.77 ml  Output 1350 ml  Net -43.23 ml   Filed Weights   11/05/21 0500 11/06/21 0341  Weight: 104.2 kg 107.6 kg    Examination:  General: A/O x4, No acute respiratory distress Eyes:  negative scleral hemorrhage, negative anisocoria, negative icterus ENT: Negative Runny nose, negative gingival bleeding, Neck:  Negative scars, masses, torticollis, lymphadenopathy, JVD Lungs: Clear to auscultation bilaterally without wheezes or crackles Cardiovascular: Regular rate and rhythm without murmur gallop or rub normal S1 and  S2 Abdomen: negative abdominal pain, nondistended, positive soft, bowel sounds, no rebound, no ascites, no appreciable mass Extremities: No significant cyanosis, clubbing, or edema bilateral lower extremities Skin: Negative rashes, lesions, ulcers Psychiatric:  Negative depression, negative anxiety, negative fatigue, negative mania  Central nervous system:  Cranial nerves II through XII intact, tongue/uvula midline, all extremities muscle strength 5/5, sensation intact throughout, negative dysarthria, negative expressive aphasia, negative receptive aphasia.  .     Data Reviewed: Care during the described time interval was provided by me .  I have reviewed this patient's available data, including medical history, events of note, physical examination, and all test results as part of my evaluation.   CBC: Recent Labs  Lab 10/31/21 1013 11/01/21 0736 11/02/21 0553 11/03/21 0536 10/27/2021 1121 11/05/21 0142 11/06/21 0122  WBC 6.7   < > 3.7* 4.2 4.0 3.6* 5.3  NEUTROABS 6.4  --   --   --  3.2 2.6 4.2  HGB 9.7*   < > 9.1* 8.6* 8.3* 7.9* 7.5*  HCT 28.3*   < > 27.2* 25.2* 23.9* 22.9* 21.5*  MCV 92.5   < > 91.3 89.4 91.2 90.9 90.3  PLT 70*   < > 41* 43* 42* 40* 45*   < > = values in this interval not displayed.   Basic Metabolic Panel: Recent Labs  Lab 11/01/21 0736 11/02/21 0553 11/03/21 0536 11/05/21 0142 11/06/21 0122  NA 131* 132* 135 132* 129*  K 3.5 3.4* 3.3* 3.0* 3.9  CL 102 104 104 104 103  CO2 22 23 22  18* 17*  GLUCOSE 182* 108* 174* 153* 201*  BUN 50* 47* 44* 50* 62*  CREATININE 1.77* 1.64* 1.59* 1.75* 2.39*  CALCIUM 7.4* 7.8* 7.8* 7.5*  7.4*  MG  --   --   --  1.7 2.3  PHOS  --   --   --   --  1.9*   GFR: Estimated Creatinine Clearance: 33.7 mL/min (A) (by C-G formula based on SCr of 2.39 mg/dL (H)). Liver Function Tests: Recent Labs  Lab 10/31/21 1013 11/01/21 0736 11/02/21 0553 11/05/21 0142 11/06/21 0122  AST 352* 219* 191* 75* 69*  ALT 231* 141* 75* 13 9  ALKPHOS 58 60 65 83 88  BILITOT 0.6 0.5 0.3 0.6 0.5  PROT 5.4* 5.4* 5.7* 4.6* 4.6*  ALBUMIN 2.9* 2.5* 2.7* 2.0* 2.0*   No results for input(s): LIPASE, AMYLASE in the last 168 hours. No results for input(s): AMMONIA in the last 168 hours. Coagulation Profile: Recent Labs  Lab 10/31/21 1013 10/31/2021 1121  INR 1.5* 1.6*   Cardiac Enzymes: No results for input(s): CKTOTAL, CKMB, CKMBINDEX, TROPONINI in the last 168 hours. BNP (last 3 results) No results for input(s): PROBNP in the last 8760 hours. HbA1C: No results for input(s): HGBA1C in the last 72 hours. CBG: Recent Labs  Lab 11/05/21 1202 11/05/21 1754 11/05/21 2113 11/06/21 0603 11/06/21 1203  GLUCAP 242* 367* 208* 278* 328*   Lipid Profile: No results for input(s): CHOL, HDL, LDLCALC, TRIG, CHOLHDL, LDLDIRECT in the last 72 hours. Thyroid Function Tests: No results for input(s): TSH, T4TOTAL, FREET4, T3FREE, THYROIDAB in the last 72 hours. Anemia Panel: Recent Labs    11/06/2021 1120  VITAMINB12 1,380*   Urine analysis:    Component Value Date/Time   COLORURINE YELLOW (A) 10/31/2021 1707   APPEARANCEUR HAZY (A) 10/31/2021 1707   LABSPEC 1.017 10/31/2021 1707   PHURINE 5.0 10/31/2021 1707   GLUCOSEU 50 (A) 10/31/2021 1707   HGBUR SMALL (  A) 10/31/2021 1707   BILIRUBINUR NEGATIVE 10/31/2021 [place] 10/31/2021 1707   PROTEINUR 100 (A) 10/31/2021 1707   NITRITE NEGATIVE 10/31/2021 1707   LEUKOCYTESUR NEGATIVE 10/31/2021 1707   Sepsis Labs: @LABRCNTIP (procalcitonin:4,lacticidven:4)  ) Recent Results (from the past 240 hour(s))  Culture, blood (Routine x 2)      Status: Abnormal   Collection Time: 10/30/21  9:51 AM   Specimen: BLOOD  Result Value Ref Range Status   Specimen Description   Final    BLOOD LEFT ANTECUBITAL Performed at [facility], [address], [place], [place] [address]    Special Requests   Final    BOTTLES DRAWN AEROBIC AND ANAEROBIC Blood Culture adequate volume Performed at [facility], [address] [place], [place] [address]    Culture  Setup Time   Final    GRAM POSITIVE COCCI IN BOTH AEROBIC AND ANAEROBIC BOTTLES CRITICAL VALUE NOTED.  VALUE IS CONSISTENT WITH PREVIOUSLY REPORTED AND CALLED VALUE. Performed at [facility], [address] [place], [place] [address]    Culture (A)  Final    STAPHYLOCOCCUS AUREUS SUSCEPTIBILITIES PERFORMED ON PREVIOUS CULTURE WITHIN THE LAST 5 DAYS. Performed at [facility], [place] [address], [place], [place] [address]    Report Status 11/02/2021 FINAL  Final  Culture, blood (Routine x 2)     Status: Abnormal   Collection Time: 10/30/21 10:02 AM   Specimen: BLOOD  Result Value Ref Range Status   Specimen Description   Final    BLOOD LEFT WRIST Performed at [facility], [address] [place], [place] [address]    Special Requests   Final    BOTTLES DRAWN AEROBIC AND ANAEROBIC Blood Culture results may not be optimal due to an excessive volume of blood received in culture bottles Performed at [facility], [address], [place], [place] [address]    Culture  Setup Time   Final    GRAM POSITIVE COCCI IN BOTH AEROBIC AND ANAEROBIC BOTTLES CRITICAL RESULT CALLED TO, READ BACK BY AND VERIFIED WITH: Leonides Schanz RN [id number] 10/30/21 HNM Performed at [facility], [place] [address], [place], [place] [address]    Culture STAPHYLOCOCCUS AUREUS (A)  Final   Report Status 11/02/2021 FINAL  Final   Organism ID, Bacteria STAPHYLOCOCCUS AUREUS  Final      Susceptibility   Staphylococcus aureus - MIC*    CIPROFLOXACIN  <=0.5 SENSITIVE Sensitive     ERYTHROMYCIN >=8 RESISTANT Resistant     GENTAMICIN <=0.5 SENSITIVE Sensitive     OXACILLIN 0.5 SENSITIVE Sensitive     TETRACYCLINE <=1 SENSITIVE Sensitive     VANCOMYCIN <=0.5 SENSITIVE Sensitive     TRIMETH/SULFA <=10 SENSITIVE Sensitive     CLINDAMYCIN RESISTANT Resistant     RIFAMPIN <=0.5 SENSITIVE Sensitive     Inducible Clindamycin POSITIVE Resistant     * STAPHYLOCOCCUS AUREUS  Blood Culture ID Panel (Reflexed)     Status: Abnormal   Collection Time: 10/30/21 10:02 AM  Result Value Ref Range Status   Enterococcus faecalis NOT DETECTED NOT DETECTED Final   Enterococcus Faecium NOT DETECTED NOT DETECTED Final   Listeria monocytogenes NOT DETECTED NOT DETECTED Final   Staphylococcus species DETECTED (A) NOT DETECTED Final    Comment: CRITICAL RESULT CALLED TO, READ BACK BY AND VERIFIED WITH: Leonides Schanz RN 2239 10/30/21 HNM    Staphylococcus aureus (BCID) DETECTED (A) NOT DETECTED Final    Comment: CRITICAL RESULT  CALLED TO, READ BACK BY AND VERIFIED WITH: Leonides Schanz RN 2239 10/30/21 HNM    Staphylococcus epidermidis NOT DETECTED NOT DETECTED Final   Staphylococcus lugdunensis NOT DETECTED NOT DETECTED Final   Streptococcus species NOT DETECTED NOT DETECTED Final   Streptococcus agalactiae NOT DETECTED NOT DETECTED Final   Streptococcus pneumoniae NOT DETECTED NOT DETECTED Final   Streptococcus pyogenes NOT DETECTED NOT DETECTED Final   A.calcoaceticus-baumannii NOT DETECTED NOT DETECTED Final   Bacteroides fragilis NOT DETECTED NOT DETECTED Final   Enterobacterales NOT DETECTED NOT DETECTED Final   Enterobacter cloacae complex NOT DETECTED NOT DETECTED Final   Escherichia coli NOT DETECTED NOT DETECTED Final   Klebsiella aerogenes NOT DETECTED NOT DETECTED Final   Klebsiella oxytoca NOT DETECTED NOT DETECTED Final   Klebsiella pneumoniae NOT DETECTED NOT DETECTED Final   Proteus species NOT DETECTED NOT DETECTED Final   Salmonella  species NOT DETECTED NOT DETECTED Final   Serratia marcescens NOT DETECTED NOT DETECTED Final   Haemophilus influenzae NOT DETECTED NOT DETECTED Final   Neisseria meningitidis NOT DETECTED NOT DETECTED Final   Pseudomonas aeruginosa NOT DETECTED NOT DETECTED Final   Stenotrophomonas maltophilia NOT DETECTED NOT DETECTED Final   Candida albicans NOT DETECTED NOT DETECTED Final   Candida auris NOT DETECTED NOT DETECTED Final   Candida glabrata NOT DETECTED NOT DETECTED Final   Candida krusei NOT DETECTED NOT DETECTED Final   Candida parapsilosis NOT DETECTED NOT DETECTED Final   Candida tropicalis NOT DETECTED NOT DETECTED Final   Cryptococcus neoformans/gattii NOT DETECTED NOT DETECTED Final   Meth resistant mecA/C and MREJ NOT DETECTED NOT DETECTED Final    Comment: Performed at [facility], [address] [place], [place] [address]  Resp Panel by RT-PCR (Flu A&B, Covid) Nasopharyngeal Swab     Status: None   Collection Time: 10/30/21 12:14 PM   Specimen: Nasopharyngeal Swab; Nasopharyngeal(NP) swabs in vial transport medium  Result Value Ref Range Status   SARS Coronavirus 2 by RT PCR NEGATIVE NEGATIVE Final    Comment: (NOTE) SARS-CoV-2 target nucleic acids are NOT DETECTED.  The SARS-CoV-2 RNA is generally detectable in upper respiratory specimens during the acute phase of infection. The lowest concentration of SARS-CoV-2 viral copies this assay can detect is 138 copies/mL. A negative result does not preclude SARS-Cov-2 infection and should not be used as the sole basis for treatment or other patient management decisions. A negative result may occur with  improper specimen collection/handling, submission of specimen other than nasopharyngeal swab, presence of viral mutation(s) within the areas targeted by this assay, and inadequate number of viral copies(<138 copies/mL). A negative result must be combined with clinical observations, patient history, and  epidemiological information. The expected result is Negative.  Fact Sheet for Patients:  EntrepreneurPulse.com.au  Fact Sheet for Healthcare Providers:  IncredibleEmployment.be  This test is no t yet approved or cleared by the [place] FDA and  has been authorized for detection and/or diagnosis of SARS-CoV-2 by FDA under an Emergency Use Authorization (EUA). This EUA will remain  in effect (meaning this test can be used) for the duration of the COVID-19 declaration under Section 564(b)(1) of the Act, 21 U.S.C.section 360bbb-3(b)(1), unless the authorization is terminated  or revoked sooner.       Influenza A by PCR NEGATIVE NEGATIVE Final   Influenza B by PCR NEGATIVE NEGATIVE Final    Comment: (NOTE) The Xpert Xpress SARS-CoV-2/FLU/RSV plus assay is intended as an aid in the diagnosis of influenza from Nasopharyngeal  swab specimens and should not be used as a sole basis for treatment. Nasal washings and aspirates are unacceptable for Xpert Xpress SARS-CoV-2/FLU/RSV testing.  Fact Sheet for Patients: EntrepreneurPulse.com.au  Fact Sheet for Healthcare Providers: IncredibleEmployment.be  This test is not yet approved or cleared by the [place] FDA and has been authorized for detection and/or diagnosis of SARS-CoV-2 by FDA under an Emergency Use Authorization (EUA). This EUA will remain in effect (meaning this test can be used) for the duration of the COVID-19 declaration under Section 564(b)(1) of the Act, 21 U.S.C. section 360bbb-3(b)(1), unless the authorization is terminated or revoked.  Performed at [facility], [address] [place], [place] [address]   Urine Culture     Status: Abnormal   Collection Time: 10/31/21 10:00 AM   Specimen: Urine, Random  Result Value Ref Range Status   Specimen Description   Final    URINE, RANDOM Performed at [facility], [address] [place], [place] [address]    Special Requests   Final    NONE Performed at [facility], [address] [place],  [address]    Culture (A)  Final    <10,000 COLONIES/mL INSIGNIFICANT GROWTH Performed at [facility], [place] [address], [place],  [address]    Report Status 11/02/2021 FINAL  Final  Resp Panel by RT-PCR (Flu A&B, Covid) Nasopharyngeal Swab     Status: None   Collection Time: 10/31/21 10:11 AM   Specimen: Nasopharyngeal Swab; Nasopharyngeal(NP) swabs in vial transport medium  Result Value Ref Range Status   SARS Coronavirus 2 by RT PCR NEGATIVE NEGATIVE Final    Comment: (NOTE) SARS-CoV-2 target nucleic acids are NOT DETECTED.  The SARS-CoV-2 RNA is generally detectable in upper respiratory specimens during the acute phase of infection. The lowest concentration of SARS-CoV-2 viral copies this assay can detect is 138 copies/mL. A negative result does not preclude SARS-Cov-2 infection and should not be used as the sole basis for treatment or other patient management decisions. A negative result may occur with  improper specimen collection/handling, submission of specimen other than nasopharyngeal swab, presence of viral mutation(s) within the areas targeted by this assay, and inadequate number of viral copies(<138 copies/mL). A negative result must be combined with clinical observations, patient history, and epidemiological information. The expected result is Negative.  Fact Sheet for Patients:  EntrepreneurPulse.com.au  Fact Sheet for Healthcare Providers:  IncredibleEmployment.be  This test is no t yet approved or cleared by the [place] FDA and  has been authorized for detection and/or diagnosis of SARS-CoV-2 by FDA under an Emergency Use Authorization (EUA). This EUA will remain  in effect (meaning this test can be used) for the duration of the COVID-19 declaration under Section  564(b)(1) of the Act, 21 U.S.C.section 360bbb-3(b)(1), unless the authorization is terminated  or revoked sooner.       Influenza A by PCR NEGATIVE NEGATIVE Final   Influenza B by PCR NEGATIVE NEGATIVE Final    Comment: (NOTE) The Xpert Xpress SARS-CoV-2/FLU/RSV plus assay is intended as an aid in the diagnosis of influenza from Nasopharyngeal swab specimens and should not be used as a sole basis for treatment. Nasal washings and aspirates are unacceptable for Xpert Xpress SARS-CoV-2/FLU/RSV testing.  Fact Sheet for Patients: EntrepreneurPulse.com.au  Fact Sheet for Healthcare Providers: IncredibleEmployment.be  This test is not yet approved or cleared by the [place] FDA and has been authorized for detection and/or diagnosis of SARS-CoV-2 by FDA under  an Emergency Use Authorization (EUA). This EUA will remain in effect (meaning this test can be used) for the duration of the COVID-19 declaration under Section 564(b)(1) of the Act, 21 U.S.C. section 360bbb-3(b)(1), unless the authorization is terminated or revoked.  Performed at [facility], [address] [place], [place] [address]   Blood Culture (routine x 2)     Status: Abnormal   Collection Time: 10/31/21 10:13 AM   Specimen: BLOOD  Result Value Ref Range Status   Specimen Description   Final    BLOOD RIGHT WRIST Performed at [facility], [address], [place], [place] [address]    Special Requests   Final    BOTTLES DRAWN AEROBIC AND ANAEROBIC Blood Culture adequate volume Performed at [facility], [address] [place], [place] [address]    Culture  Setup Time   Final    GRAM POSITIVE COCCI IN BOTH AEROBIC AND ANAEROBIC BOTTLES CRITICAL RESULT CALLED TO, READ BACK BY AND VERIFIED WITH: SHEEMA HALLAGI AT 1900 ON 10/31/21 BY SS    Culture (A)  Final    STAPHYLOCOCCUS AUREUS SUSCEPTIBILITIES PERFORMED ON PREVIOUS CULTURE WITHIN THE  LAST 5 DAYS. Performed at [facility], [place] [address] [place], [place] [address]    Report Status 11/02/2021 FINAL  Final  Blood Culture (routine x 2)     Status: Abnormal   Collection Time: 10/31/21 10:13 AM   Specimen: BLOOD  Result Value Ref Range Status   Specimen Description   Final    BLOOD LEFT WRIST Performed at [facility], [address] [place], [place] [address]    Special Requests   Final    BOTTLES DRAWN AEROBIC AND ANAEROBIC BCAV Performed at [facility], [address], [place], [place] [address]    Culture  Setup Time   Final    GRAM POSITIVE COCCI IN BOTH AEROBIC AND ANAEROBIC BOTTLES CRITICAL VALUE NOTED.  VALUE IS CONSISTENT WITH PREVIOUSLY REPORTED AND CALLED VALUE. Performed at [facility], [address] [place], [place] [address]    Culture (A)  Final    STAPHYLOCOCCUS AUREUS SUSCEPTIBILITIES PERFORMED ON PREVIOUS CULTURE WITHIN THE LAST 5 DAYS. Performed at [facility], [place] [address] [place], [place] [address]    Report Status 11/02/2021 FINAL  Final  Blood Culture ID Panel (Reflexed)     Status: Abnormal   Collection Time: 10/31/21 10:13 AM  Result Value Ref Range Status   Enterococcus faecalis NOT DETECTED NOT DETECTED Final   Enterococcus Faecium NOT DETECTED NOT DETECTED Final   Listeria monocytogenes NOT DETECTED NOT DETECTED Final   Staphylococcus species DETECTED (A) NOT DETECTED Final    Comment: CRITICAL RESULT CALLED TO, READ BACK BY AND VERIFIED WITH: SHEEMA HALLAGI AT 1900 ON 10/31/21 BY SS    Staphylococcus aureus (BCID) DETECTED (A) NOT DETECTED Final    Comment: CRITICAL RESULT CALLED TO, READ BACK BY AND VERIFIED WITH: SHEEMA HALLAGI AT 1900 ON 10/31/21 BY SS    Staphylococcus epidermidis NOT DETECTED NOT DETECTED Final   Staphylococcus lugdunensis NOT DETECTED NOT DETECTED Final   Streptococcus species NOT DETECTED NOT DETECTED Final   Streptococcus agalactiae NOT DETECTED NOT  DETECTED Final   Streptococcus pneumoniae NOT DETECTED NOT DETECTED Final   Streptococcus pyogenes NOT DETECTED NOT DETECTED Final   A.calcoaceticus-baumannii NOT DETECTED NOT DETECTED Final   Bacteroides fragilis NOT DETECTED NOT DETECTED Final   Enterobacterales NOT DETECTED NOT DETECTED Final   Enterobacter  cloacae complex NOT DETECTED NOT DETECTED Final   Escherichia coli NOT DETECTED NOT DETECTED Final   Klebsiella aerogenes NOT DETECTED NOT DETECTED Final   Klebsiella oxytoca NOT DETECTED NOT DETECTED Final   Klebsiella pneumoniae NOT DETECTED NOT DETECTED Final   Proteus species NOT DETECTED NOT DETECTED Final   Salmonella species NOT DETECTED NOT DETECTED Final   Serratia marcescens NOT DETECTED NOT DETECTED Final   Haemophilus influenzae NOT DETECTED NOT DETECTED Final   Neisseria meningitidis NOT DETECTED NOT DETECTED Final   Pseudomonas aeruginosa NOT DETECTED NOT DETECTED Final   Stenotrophomonas maltophilia NOT DETECTED NOT DETECTED Final   Candida albicans NOT DETECTED NOT DETECTED Final   Candida auris NOT DETECTED NOT DETECTED Final   Candida glabrata NOT DETECTED NOT DETECTED Final   Candida krusei NOT DETECTED NOT DETECTED Final   Candida parapsilosis NOT DETECTED NOT DETECTED Final   Candida tropicalis NOT DETECTED NOT DETECTED Final   Cryptococcus neoformans/gattii NOT DETECTED NOT DETECTED Final   Meth resistant mecA/C and MREJ NOT DETECTED NOT DETECTED Final    Comment: Performed at [facility], [address] [place], [place] [address]  CULTURE, BLOOD (ROUTINE X 2) w Reflex to ID Panel     Status: None (Preliminary result)   Collection Time: 11/12/2021  3:42 AM   Specimen: BLOOD  Result Value Ref Range Status   Specimen Description BLOOD LEFT HAND  Final   Special Requests   Final    BOTTLES DRAWN AEROBIC AND ANAEROBIC Blood Culture results may not be optimal due to an excessive volume of blood received in culture bottles   Culture   Final    NO  GROWTH 2 DAYS Performed at [facility], [address] [place], [place] [address]    Report Status PENDING  Incomplete  CULTURE, BLOOD (ROUTINE X 2) w Reflex to ID Panel     Status: None (Preliminary result)   Collection Time: 11/01/2021  3:48 AM   Specimen: BLOOD  Result Value Ref Range Status   Specimen Description BLOOD LEFT HAND  Final   Special Requests   Final    BOTTLES DRAWN AEROBIC AND ANAEROBIC Blood Culture results may not be optimal due to an excessive volume of blood received in culture bottles   Culture   Final    NO GROWTH 2 DAYS Performed at [facility], [address], [place], [place] [address]    Report Status PENDING  Incomplete         Radiology Studies: No results found.      Scheduled Meds:  sodium chloride   Intravenous Once   allopurinol  100 mg Oral Daily   aspirin EC  81 mg Oral Daily   carvedilol  6.25 mg Oral BID   Chlorhexidine Gluconate Cloth  6 each Topical Daily   gabapentin  100 mg Oral Daily   insulin aspart  0-15 Units Subcutaneous Q4H   insulin glargine-yfgn  11 Units Subcutaneous QHS   mometasone-formoterol  2 puff Inhalation BID   pantoprazole  40 mg Oral BID   rosuvastatin  40 mg Oral QHS   saccharomyces boulardii  250 mg Oral BID   sertraline  200 mg Oral Daily   sodium chloride flush  3 mL Intravenous Q12H   sucralfate  1 g Oral QID   tamsulosin  0.4 mg Oral BID   Continuous Infusions:  sodium chloride 75 mL/hr at 11/06/21 0625    ceFAZolin (ANCEF) IV 2 g (11/06/21 1329)  sodium phosphate  Dextrose 5% IVPB       LOS: 2 days   The patient is critically ill with multiple organ systems failure and requires high complexity decision making for assessment and support, frequent evaluation and titration of therapies, application of advanced monitoring technologies and extensive interpretation of multiple databases. Critical Care Time devoted to patient care services described in this note  Time spent: 40  minutes     Breona Cherubin, Geraldo Docker, MD [facility]   If 7PM-7AM, please contact night-coverage 11/06/2021, 3:47 PM

## 2021-11-06 NOTE — Progress Notes (Signed)
Patient has not voided all night long since 1810 when output was documented on day shift. Bladder scan done showing only 33 ml. Patient denies any discomfort. Will pass it along to dayshift.

## 2021-11-06 NOTE — Plan of Care (Signed)
  Problem: Clinical Measurements: Goal: Respiratory complications will improve Outcome: Progressing Goal: Cardiovascular complication will be avoided Outcome: Progressing   Problem: Clinical Measurements: Goal: Will remain free from infection Outcome: Not Progressing

## 2021-11-06 NOTE — Progress Notes (Signed)
Progress Note  Patient Name: Nathan Singleton Date of Encounter: 11/06/2021  Primary Cardiologist: None   Subjective   No chest pain or sob. Thought he was going to have his lead removed today.  Inpatient Medications    Scheduled Meds:  sodium chloride   Intravenous Once   allopurinol  100 mg Oral Daily   aspirin EC  81 mg Oral Daily   carvedilol  6.25 mg Oral BID   gabapentin  100 mg Oral Daily   insulin aspart  0-9 Units Subcutaneous TID WC   insulin glargine-yfgn  11 Units Subcutaneous QHS   mometasone-formoterol  2 puff Inhalation BID   pantoprazole  40 mg Oral BID   rosuvastatin  40 mg Oral QHS   saccharomyces boulardii  250 mg Oral BID   sertraline  200 mg Oral Daily   sodium chloride flush  3 mL Intravenous Q12H   sucralfate  1 g Oral QID   tamsulosin  0.4 mg Oral Daily   Continuous Infusions:  sodium chloride 75 mL/hr at 11/06/21 0625    ceFAZolin (ANCEF) IV 2 g (11/06/21 0533)   PRN Meds: acetaminophen **OR** acetaminophen, albuterol   Vital Signs    Vitals:   11/05/21 2302 11/06/21 0341 11/06/21 0846 11/06/21 0854  BP: (!) 98/51 (!) 91/55  (!) 101/50  Pulse: (!) 40 (!) 40 (!) 40 (!) 40  Resp: (!) 21 16 18 16   Temp: 98.6 F (37 C) 98.7 F (37.1 C)  97.9 F (36.6 C)  TempSrc: Oral Oral  Oral  SpO2: 100% 100% 100% 97%  Weight:  107.6 kg    Height:        Intake/Output Summary (Last 24 hours) at 11/06/2021 0946 Last data filed at 11/06/2021 0114 Gross per 24 hour  Intake 1666.77 ml  Output 550 ml  Net 1116.77 ml   Filed Weights   11/05/21 0500 11/06/21 0341  Weight: 104.2 kg 107.6 kg    Telemetry    Sinus brady with ventricular pacing - Personally Reviewed  ECG    none - Personally Reviewed  Physical Exam   GEN: No acute distress.   Neck: No JVD Cardiac: RRR, no murmurs, rubs, or gallops.  Respiratory: Clear to auscultation bilaterally. GI: Soft, nontender, non-distended  MS: No edema; No deformity. Neuro:  Nonfocal  Psych:  Normal affect   Labs    Chemistry Recent Labs  Lab 11/02/21 0553 11/03/21 0536 11/05/21 0142 11/06/21 0122  NA 132* 135 132* 129*  K 3.4* 3.3* 3.0* 3.9  CL 104 104 104 103  CO2 23 22 18* 17*  GLUCOSE 108* 174* 153* 201*  BUN 47* 44* 50* 62*  CREATININE 1.64* 1.59* 1.75* 2.39*  CALCIUM 7.8* 7.8* 7.5* 7.4*  PROT 5.7*  --  4.6* 4.6*  ALBUMIN 2.7*  --  2.0* 2.0*  AST 191*  --  75* 69*  ALT 75*  --  13 9  ALKPHOS 65  --  83 88  BILITOT 0.3  --  0.6 0.5  GFRNONAA 42* 44* 39* 27*  ANIONGAP 5 9 10 9      Hematology Recent Labs  Lab 10/30/2021 1121 11/05/21 0142 11/06/21 0122  WBC 4.0 3.6* 5.3  RBC 2.62* 2.52* 2.38*  HGB 8.3* 7.9* 7.5*  HCT 23.9* 22.9* 21.5*  MCV 91.2 90.9 90.3  MCH 31.7 31.3 31.5  MCHC 34.7 34.5 34.9  RDW 13.9 13.9 14.3  PLT 42* 40* 45*    Cardiac EnzymesNo results for input(s): TROPONINI in the  last 168 hours. No results for input(s): TROPIPOC in the last 168 hours.   BNPNo results for input(s): BNP, PROBNP in the last 168 hours.   DDimer No results for input(s): DDIMER in the last 168 hours.   Radiology    No results found.  Cardiac Studies   none  Patient Profile     80 y.o. male admitted for PM system extraction.  Assessment & Plan    PM system infection/recurrent Staph bacteremia - he will continue IV anti-biotics and undergo PM system extraction tomorrow with insertion of a temporary perm PM.  Bacteremia - I suspect that he will need 4-6 weeks of IV anti-biotics, followed by re-insertion of a new perm PM. Renal insufficiency -this may be from reduced output. We will turn his rates up tomorrow.     For questions or updates, please contact [place] Please consult [email or web address] for contact info under Cardiology/STEMI.      Signed, Cristopher Peru, MD  11/06/2021, 9:46 AM

## 2021-11-06 NOTE — Progress Notes (Signed)
Bladder scan 790. MD notified. Ordered foley. Foley inserted, patient tolerated well. 800 mL output.  Era Bumpers, RN

## 2021-11-07 ENCOUNTER — Inpatient Hospital Stay (HOSPITAL_COMMUNITY): Payer: 59

## 2021-11-07 ENCOUNTER — Inpatient Hospital Stay (HOSPITAL_COMMUNITY): Admission: RE | Admit: 2021-11-07 | Payer: 59 | Source: Home / Self Care | Admitting: Internal Medicine

## 2021-11-07 ENCOUNTER — Inpatient Hospital Stay (HOSPITAL_COMMUNITY): Payer: 59 | Admitting: Anesthesiology

## 2021-11-07 ENCOUNTER — Encounter (HOSPITAL_COMMUNITY): Admission: AD | Disposition: E | Payer: Self-pay | Source: Other Acute Inpatient Hospital | Attending: Internal Medicine

## 2021-11-07 ENCOUNTER — Encounter (HOSPITAL_COMMUNITY): Payer: Self-pay | Admitting: Family Medicine

## 2021-11-07 DIAGNOSIS — Z419 Encounter for procedure for purposes other than remedying health state, unspecified: Secondary | ICD-10-CM

## 2021-11-07 DIAGNOSIS — I2510 Atherosclerotic heart disease of native coronary artery without angina pectoris: Secondary | ICD-10-CM

## 2021-11-07 HISTORY — PX: TEE WITHOUT CARDIOVERSION: SHX5443

## 2021-11-07 HISTORY — PX: PACEMAKER LEAD REMOVAL: SHX5064

## 2021-11-07 LAB — GLUCOSE, CAPILLARY
Glucose-Capillary: 372 mg/dL — ABNORMAL HIGH (ref 70–99)
Glucose-Capillary: 262 mg/dL — ABNORMAL HIGH (ref 70–99)
Glucose-Capillary: 207 mg/dL — ABNORMAL HIGH (ref 70–99)
Glucose-Capillary: 131 mg/dL — ABNORMAL HIGH (ref 70–99)
Glucose-Capillary: 160 mg/dL — ABNORMAL HIGH (ref 70–99)
Glucose-Capillary: 305 mg/dL — ABNORMAL HIGH (ref 70–99)
Glucose-Capillary: 370 mg/dL — ABNORMAL HIGH (ref 70–99)
Glucose-Capillary: 381 mg/dL — ABNORMAL HIGH (ref 70–99)

## 2021-11-07 LAB — MAGNESIUM: Magnesium: 2.1 mg/dL (ref 1.7–2.4)

## 2021-11-07 LAB — CBC WITH DIFFERENTIAL/PLATELET
Abs Immature Granulocytes: 0.09 10*3/uL — ABNORMAL HIGH (ref 0.00–0.07)
Basophils Absolute: 0 10*3/uL (ref 0.0–0.1)
Basophils Relative: 0 %
Eosinophils Absolute: 0.1 10*3/uL (ref 0.0–0.5)
Eosinophils Relative: 1 %
HCT: 26.5 % — ABNORMAL LOW (ref 39.0–52.0)
Hemoglobin: 8.9 g/dL — ABNORMAL LOW (ref 13.0–17.0)
Immature Granulocytes: 1 %
Lymphocytes Relative: 4 %
Lymphs Abs: 0.4 10*3/uL — ABNORMAL LOW (ref 0.7–4.0)
MCH: 30.4 pg (ref 26.0–34.0)
MCHC: 33.6 g/dL (ref 30.0–36.0)
MCV: 90.4 fL (ref 80.0–100.0)
Monocytes Absolute: 0.5 10*3/uL (ref 0.1–1.0)
Monocytes Relative: 5 %
Neutro Abs: 8.7 10*3/uL — ABNORMAL HIGH (ref 1.7–7.7)
Neutrophils Relative %: 89 %
Platelets: 56 10*3/uL — ABNORMAL LOW (ref 150–400)
RBC: 2.93 MIL/uL — ABNORMAL LOW (ref 4.22–5.81)
RDW: 15.2 % (ref 11.5–15.5)
WBC: 9.8 10*3/uL (ref 4.0–10.5)
nRBC: 0 % (ref 0.0–0.2)

## 2021-11-07 LAB — COMPREHENSIVE METABOLIC PANEL
ALT: 7 U/L (ref 0–44)
AST: 84 U/L — ABNORMAL HIGH (ref 15–41)
Albumin: 1.9 g/dL — ABNORMAL LOW (ref 3.5–5.0)
Alkaline Phosphatase: 90 U/L (ref 38–126)
Anion gap: 10 (ref 5–15)
BUN: 81 mg/dL — ABNORMAL HIGH (ref 8–23)
CO2: 17 mmol/L — ABNORMAL LOW (ref 22–32)
Calcium: 7.3 mg/dL — ABNORMAL LOW (ref 8.9–10.3)
Chloride: 102 mmol/L (ref 98–111)
Creatinine, Ser: 3.1 mg/dL — ABNORMAL HIGH (ref 0.61–1.24)
GFR, Estimated: 20 mL/min — ABNORMAL LOW (ref >60)
Glucose, Bld: 245 mg/dL — ABNORMAL HIGH (ref 70–99)
Potassium: 4.1 mmol/L (ref 3.5–5.1)
Sodium: 129 mmol/L — ABNORMAL LOW (ref 135–145)
Total Bilirubin: 0.6 mg/dL (ref 0.3–1.2)
Total Protein: 4.6 g/dL — ABNORMAL LOW (ref 6.5–8.1)

## 2021-11-07 LAB — ECHO INTRAOPERATIVE TEE
Height: 76 in
Weight: 3950.64 oz

## 2021-11-07 LAB — SURGICAL PCR SCREEN
MRSA, PCR: NEGATIVE
Staphylococcus aureus: POSITIVE — AB

## 2021-11-07 LAB — PHOSPHORUS: Phosphorus: 2.9 mg/dL (ref 2.5–4.6)

## 2021-11-07 SURGERY — REMOVAL, ELECTRODE LEAD, CARDIAC PACEMAKER, WITHOUT REPLACEMENT
Anesthesia: General

## 2021-11-07 MED ORDER — CEFAZOLIN SODIUM-DEXTROSE 1-4 GM/50ML-% IV SOLN
1.0000 g | Freq: Two times a day (BID) | INTRAVENOUS | Status: DC
  Administered 2021-11-08 – 2021-11-09 (×4): 1 g via INTRAVENOUS
  Filled 2021-11-07 (×7): qty 50

## 2021-11-07 MED ORDER — SODIUM BICARBONATE 8.4 % IV SOLN
INTRAVENOUS | Status: DC
  Filled 2021-11-07 (×2): qty 1000

## 2021-11-07 MED ORDER — PHENYLEPHRINE HCL (PRESSORS) 10 MG/ML IV SOLN
INTRAVENOUS | Status: DC | PRN
  Administered 2021-11-07: 100 ug via INTRAVENOUS

## 2021-11-07 MED ORDER — FENTANYL CITRATE (PF) 100 MCG/2ML IJ SOLN
INTRAMUSCULAR | Status: DC | PRN
  Administered 2021-11-07: 100 ug via INTRAVENOUS

## 2021-11-07 MED ORDER — ROCURONIUM BROMIDE 100 MG/10ML IV SOLN
INTRAVENOUS | Status: DC | PRN
  Administered 2021-11-07: 50 mg via INTRAVENOUS

## 2021-11-07 MED ORDER — PROMETHAZINE HCL 25 MG/ML IJ SOLN: 6.2500 mg | INTRAMUSCULAR | Status: DC | PRN

## 2021-11-07 MED ORDER — ALBUMIN HUMAN 5 % IV SOLN: INTRAVENOUS | Status: DC | PRN

## 2021-11-07 MED ORDER — DEXAMETHASONE SODIUM PHOSPHATE 10 MG/ML IJ SOLN
INTRAMUSCULAR | Status: DC | PRN
  Administered 2021-11-07: 5 mg via INTRAVENOUS

## 2021-11-07 MED ORDER — ALUM & MAG HYDROXIDE-SIMETH 200-200-20 MG/5ML PO SUSP
30.0000 mL | ORAL | Status: DC | PRN
  Administered 2021-11-08 (×2): 30 mL via ORAL
  Filled 2021-11-07 (×2): qty 30

## 2021-11-07 MED ORDER — SUGAMMADEX SODIUM 200 MG/2ML IV SOLN
INTRAVENOUS | Status: DC | PRN
  Administered 2021-11-07: 200 mg via INTRAVENOUS

## 2021-11-07 MED ORDER — FENTANYL CITRATE (PF) 250 MCG/5ML IJ SOLN
INTRAMUSCULAR | Status: AC
  Filled 2021-11-07: qty 5

## 2021-11-07 MED ORDER — MIDAZOLAM HCL 2 MG/2ML IJ SOLN
INTRAMUSCULAR | Status: AC
  Filled 2021-11-07: qty 2

## 2021-11-07 MED ORDER — LACTATED RINGERS IV SOLN: INTRAVENOUS | Status: DC

## 2021-11-07 MED ORDER — SODIUM CHLORIDE 0.9 % IV SOLN
INTRAVENOUS | Status: AC
  Filled 2021-11-07: qty 2

## 2021-11-07 MED ORDER — MUPIROCIN 2 % EX OINT
1.0000 "application " | TOPICAL_OINTMENT | Freq: Two times a day (BID) | CUTANEOUS | Status: DC
  Administered 2021-11-07 – 2021-11-09 (×4): 1 via NASAL
  Filled 2021-11-07: qty 22

## 2021-11-07 MED ORDER — CHLORHEXIDINE GLUCONATE 0.12 % MT SOLN
OROMUCOSAL | Status: AC
  Administered 2021-11-07: 15 mL via OROMUCOSAL
  Filled 2021-11-07: qty 15

## 2021-11-07 MED ORDER — PHENYLEPHRINE HCL-NACL 20-0.9 MG/250ML-% IV SOLN
INTRAVENOUS | Status: DC | PRN
  Administered 2021-11-07: 50 ug/min via INTRAVENOUS

## 2021-11-07 MED ORDER — ORAL CARE MOUTH RINSE: 15.0000 mL | Freq: Once | OROMUCOSAL | Status: AC

## 2021-11-07 MED ORDER — PROPOFOL 10 MG/ML IV BOLUS
INTRAVENOUS | Status: AC
  Filled 2021-11-07: qty 20

## 2021-11-07 MED ORDER — INSULIN ASPART 100 UNIT/ML IJ SOLN
0.0000 [IU] | INTRAMUSCULAR | Status: DC
  Administered 2021-11-07: 20 [IU] via SUBCUTANEOUS
  Administered 2021-11-08: 4 [IU] via SUBCUTANEOUS
  Administered 2021-11-08: 3 [IU] via SUBCUTANEOUS
  Administered 2021-11-08: 7 [IU] via SUBCUTANEOUS
  Administered 2021-11-08: 4 [IU] via SUBCUTANEOUS
  Administered 2021-11-08: 3 [IU] via SUBCUTANEOUS
  Administered 2021-11-08: 20 [IU] via SUBCUTANEOUS
  Administered 2021-11-08: 3 [IU] via SUBCUTANEOUS
  Administered 2021-11-09: 10:00:00 4 [IU] via SUBCUTANEOUS
  Administered 2021-11-09: 13:00:00 11 [IU] via SUBCUTANEOUS
  Administered 2021-11-09: 05:00:00 4 [IU] via SUBCUTANEOUS

## 2021-11-07 MED ORDER — PROPOFOL 10 MG/ML IV BOLUS
INTRAVENOUS | Status: DC | PRN
  Administered 2021-11-07: 120 mg via INTRAVENOUS

## 2021-11-07 MED ORDER — FENTANYL CITRATE (PF) 100 MCG/2ML IJ SOLN: 25.0000 ug | INTRAMUSCULAR | Status: DC | PRN

## 2021-11-07 MED ORDER — LIDOCAINE HCL (CARDIAC) PF 100 MG/5ML IV SOSY
PREFILLED_SYRINGE | INTRAVENOUS | Status: DC | PRN
  Administered 2021-11-07: 30 mg via INTRAVENOUS

## 2021-11-07 MED ORDER — HEPARIN 6000 UNIT IRRIGATION SOLUTION
Status: AC
  Filled 2021-11-07: qty 500

## 2021-11-07 MED ORDER — ONDANSETRON HCL 4 MG/2ML IJ SOLN
4.0000 mg | INTRAMUSCULAR | Status: DC | PRN
  Administered 2021-11-08 (×3): 4 mg via INTRAVENOUS
  Filled 2021-11-07 (×4): qty 2

## 2021-11-07 MED ORDER — CHLORHEXIDINE GLUCONATE 0.12 % MT SOLN: 15.0000 mL | Freq: Once | OROMUCOSAL | Status: AC

## 2021-11-07 MED ORDER — SODIUM CHLORIDE 0.9% IV SOLUTION: Freq: Once | INTRAVENOUS | Status: DC

## 2021-11-07 SURGICAL SUPPLY — 41 items
BAG BANDED W/RUBBER/TAPE 36X54 (MISCELLANEOUS) ×1 IMPLANT
BAG COUNTER SPONGE SURGICOUNT (BAG) ×1 IMPLANT
BIOPATCH RED 1 DISK 7.0 (GAUZE/BANDAGES/DRESSINGS) ×1 IMPLANT
BLADE OSCILLATING /SAGITTAL (BLADE) IMPLANT
BLADE STERNUM SYSTEM 6 (BLADE) ×2 IMPLANT
BNDG COHESIVE 4X5 WHT NS (GAUZE/BANDAGES/DRESSINGS) IMPLANT
BRUSH SCRUB EZ PLAIN DRY (MISCELLANEOUS) ×2 IMPLANT
CANISTER SUCT 3000ML PPV (MISCELLANEOUS) ×1 IMPLANT
CATH S G BIP PACING (CATHETERS) ×1 IMPLANT
COVER BACK TABLE 60X90IN (DRAPES) ×2 IMPLANT
DRAPE CARDIOVASCULAR INCISE (DRAPES) ×1
DRAPE SRG 135X102X78XABS (DRAPES) ×1 IMPLANT
DRSG OPSITE 6X11 MED (GAUZE/BANDAGES/DRESSINGS) IMPLANT
DRSG TEGADERM 4X4.75 (GAUZE/BANDAGES/DRESSINGS) ×2 IMPLANT
ELECT REM PT RETURN 9FT ADLT (ELECTROSURGICAL) ×4
ELECTRODE REM PT RTRN 9FT ADLT (ELECTROSURGICAL) ×2 IMPLANT
GAUZE 4X4 16PLY ~~LOC~~+RFID DBL (SPONGE) ×2 IMPLANT
GAUZE SPONGE 4X4 12PLY STRL (GAUZE/BANDAGES/DRESSINGS) ×1 IMPLANT
GAUZE SPONGE 4X4 12PLY STRL LF (GAUZE/BANDAGES/DRESSINGS) ×1 IMPLANT
GLOVE SURG GAMMEX LF SZ6.5 (GLOVE) ×1 IMPLANT
GLOVE SURG LTX SZ8 (GLOVE) ×2 IMPLANT
GLOVE SURG MICRO LTX SZ6 (GLOVE) ×1 IMPLANT
GLOVE SURG UNDER POLY LF SZ7.5 (GLOVE) ×2 IMPLANT
GOWN STRL REUS W/ TWL LRG LVL3 (GOWN DISPOSABLE) IMPLANT
GOWN STRL REUS W/ TWL XL LVL3 (GOWN DISPOSABLE) ×1 IMPLANT
GOWN STRL REUS W/TWL LRG LVL3 (GOWN DISPOSABLE) ×3
GOWN STRL REUS W/TWL XL LVL3 (GOWN DISPOSABLE) ×1
KIT TURNOVER KIT B (KITS) ×2 IMPLANT
LEAD TENDRIL MRI 58CM LPA1200M (Lead) ×1 IMPLANT
PAD ARMBOARD 7.5X6 YLW CONV (MISCELLANEOUS) ×4 IMPLANT
PAD ELECT DEFIB RADIOL ZOLL (MISCELLANEOUS) ×2 IMPLANT
SUT PROLENE 2 0 SH DA (SUTURE) ×1 IMPLANT
SUT PROLENE 3 0 SH 48 (SUTURE) ×1 IMPLANT
SUT SILK  1 MH (SUTURE) ×2
SUT SILK 1 MH (SUTURE) IMPLANT
TAPE CLOTH SURG 4X10 WHT LF (GAUZE/BANDAGES/DRESSINGS) ×1 IMPLANT
TOWEL GREEN STERILE (TOWEL DISPOSABLE) ×3 IMPLANT
TOWEL GREEN STERILE FF (TOWEL DISPOSABLE) ×3 IMPLANT
TRAY FOLEY SLVR 16FR TEMP STAT (SET/KITS/TRAYS/PACK) ×1 IMPLANT
TUBE CONNECTING 12X1/4 (SUCTIONS) ×1 IMPLANT
YANKAUER SUCT BULB TIP NO VENT (SUCTIONS) ×1 IMPLANT

## 2021-11-07 NOTE — Progress Notes (Signed)
Progress Note  Patient Name: Nathan Singleton Date of Encounter: 10/24/2021  Primary Cardiologist: None   Subjective   No chest pain or sob.   Inpatient Medications    Scheduled Meds:  sodium chloride   Intravenous Once   sodium chloride   Intravenous Once   allopurinol  100 mg Oral Daily   Chlorhexidine Gluconate Cloth  6 each Topical Daily   gabapentin  100 mg Oral Daily   gentamicin irrigation  80 mg Irrigation To OR   insulin aspart  0-15 Units Subcutaneous Q4H   insulin glargine-yfgn  11 Units Subcutaneous QHS   mometasone-formoterol  2 puff Inhalation BID   mupirocin ointment  1 application Nasal BID   pantoprazole  40 mg Oral BID   rosuvastatin  40 mg Oral QHS   saccharomyces boulardii  250 mg Oral BID   sertraline  200 mg Oral Daily   sodium chloride flush  3 mL Intravenous Q12H   sucralfate  1 g Oral QID   tamsulosin  0.4 mg Oral BID   Continuous Infusions:  sodium chloride 50 mL/hr at 10/28/2021 0538   sodium chloride     sodium chloride Stopped (10/17/2021 0538)    ceFAZolin (ANCEF) IV 2 g (11/08/2021 0540)    ceFAZolin (ANCEF) IV     sodium phosphate  Dextrose 5% IVPB     PRN Meds: acetaminophen **OR** acetaminophen, albuterol, sodium chloride flush   Vital Signs    Vitals:   11/06/21 2347 11/09/2021 0026 10/24/2021 0204 11/08/2021 0348  BP: 101/69 (!) 98/49 (!) 100/51 (!) 106/59  Pulse: (!) 40 (!) 40 (!) 40 (!) 40  Resp: 18 14 20 20   Temp: 98.2 F (36.8 C) 98.8 F (37.1 C) 98.8 F (37.1 C) 98.4 F (36.9 C)  TempSrc: Oral Oral Oral Oral  SpO2: 100% 100% 99% 99%  Weight:    112 kg  Height:        Intake/Output Summary (Last 24 hours) at 10/30/2021 0735 Last data filed at 11/06/2021 0711 Gross per 24 hour  Intake 1166 ml  Output 1350 ml  Net -184 ml   Filed Weights   11/05/21 0500 11/06/21 0341 10/30/2021 0348  Weight: 104.2 kg 107.6 kg 112 kg    Telemetry    V. Paced at 40, now v paced at 60/min - Personally Reviewed  ECG    none -  Personally Reviewed  Physical Exam   GEN: No acute distress.   Neck: 7 cm JVD Cardiac: Reg brady, no murmurs, rubs, or gallops.  Respiratory: Clear to auscultation bilaterally. GI: Soft, nontender, non-distended  MS: No edema; No deformity. Neuro:  Nonfocal  Psych: Normal affect   Labs    Chemistry Recent Labs  Lab 11/05/21 0142 11/06/21 0122 10/26/2021 0213  NA 132* 129* 129*  K 3.0* 3.9 4.1  CL 104 103 102  CO2 18* 17* 17*  GLUCOSE 153* 201* 245*  BUN 50* 62* 81*  CREATININE 1.75* 2.39* 3.10*  CALCIUM 7.5* 7.4* 7.3*  PROT 4.6* 4.6* 4.6*  ALBUMIN 2.0* 2.0* 1.9*  AST 75* 69* 84*  ALT 13 9 7   ALKPHOS 83 88 90  BILITOT 0.6 0.5 0.6  GFRNONAA 39* 27* 20*  ANIONGAP 10 9 10      Hematology Recent Labs  Lab 11/05/21 0142 11/06/21 0122 10/21/2021 0213  WBC 3.6* 5.3 9.8  RBC 2.52* 2.38* 2.93*  HGB 7.9* 7.5* 8.9*  HCT 22.9* 21.5* 26.5*  MCV 90.9 90.3 90.4  MCH 31.3 31.5 30.4  MCHC 34.5 34.9 33.6  RDW 13.9 14.3 15.2  PLT 40* 45* 56*    Cardiac EnzymesNo results for input(s): TROPONINI in the last 168 hours. No results for input(s): TROPIPOC in the last 168 hours.   BNPNo results for input(s): BNP, PROBNP in the last 168 hours.   DDimer No results for input(s): DDIMER in the last 168 hours.   Radiology    No results found.  Cardiac Studies   none  Patient Profile     80 y.o. 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   Assessment & Plan    Recurrent bacteremia - he will undergo PM system extraction and insertion of a temporary perm PM today. Continue IV anti-biotics Worsening renal dysfunction - this is associated with a reduced PM rate. We have turned his rate up to 60 and will keep at 60-70 post op until a new device is placed. Could be immune complex mediated as well though I suspect more pre-renal. 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 4. Anemia - he has been  transfused and may require more blood.    For questions or updates, please contact [place] Please consult [email or web address] for contact info under Cardiology/STEMI.      Signed, Cristopher Peru, MD  10/17/2021, 7:35 AM

## 2021-11-07 NOTE — Progress Notes (Signed)
Patients left shoulder dressing noted increased drainage. New drainage marked. Paged MD. Waiting response. Will continue to monitor.  Daymon Larsen, RN

## 2021-11-07 NOTE — H&P (View-Only) (Signed)
Progress Note  Patient Name: Nathan Singleton Date of Encounter: 10/21/2021  Primary Cardiologist: None   Subjective   No chest pain or sob.   Inpatient Medications    Scheduled Meds:  sodium chloride   Intravenous Once   sodium chloride   Intravenous Once   allopurinol  100 mg Oral Daily   Chlorhexidine Gluconate Cloth  6 each Topical Daily   gabapentin  100 mg Oral Daily   gentamicin irrigation  80 mg Irrigation To OR   insulin aspart  0-15 Units Subcutaneous Q4H   insulin glargine-yfgn  11 Units Subcutaneous QHS   mometasone-formoterol  2 puff Inhalation BID   mupirocin ointment  1 application Nasal BID   pantoprazole  40 mg Oral BID   rosuvastatin  40 mg Oral QHS   saccharomyces boulardii  250 mg Oral BID   sertraline  200 mg Oral Daily   sodium chloride flush  3 mL Intravenous Q12H   sucralfate  1 g Oral QID   tamsulosin  0.4 mg Oral BID   Continuous Infusions:  sodium chloride 50 mL/hr at 10/24/2021 0538   sodium chloride     sodium chloride Stopped (10/31/2021 0538)    ceFAZolin (ANCEF) IV 2 g (11/03/2021 0540)    ceFAZolin (ANCEF) IV     sodium phosphate  Dextrose 5% IVPB     PRN Meds: acetaminophen **OR** acetaminophen, albuterol, sodium chloride flush   Vital Signs    Vitals:   11/06/21 2347 10/14/2021 0026 10/20/2021 0204 10/30/2021 0348  BP: 101/69 (!) 98/49 (!) 100/51 (!) 106/59  Pulse: (!) 40 (!) 40 (!) 40 (!) 40  Resp: 18 14 20 20   Temp: 98.2 F (36.8 C) 98.8 F (37.1 C) 98.8 F (37.1 C) 98.4 F (36.9 C)  TempSrc: Oral Oral Oral Oral  SpO2: 100% 100% 99% 99%  Weight:    112 kg  Height:        Intake/Output Summary (Last 24 hours) at 10/22/2021 0735 Last data filed at 10/22/2021 0711 Gross per 24 hour  Intake 1166 ml  Output 1350 ml  Net -184 ml   Filed Weights   11/05/21 0500 11/06/21 0341 11/08/2021 0348  Weight: 104.2 kg 107.6 kg 112 kg    Telemetry    V. Paced at 40, now v paced at 60/min - Personally Reviewed  ECG    none -  Personally Reviewed  Physical Exam   GEN: No acute distress.   Neck: 7 cm JVD Cardiac: Reg brady, no murmurs, rubs, or gallops.  Respiratory: Clear to auscultation bilaterally. GI: Soft, nontender, non-distended  MS: No edema; No deformity. Neuro:  Nonfocal  Psych: Normal affect   Labs    Chemistry Recent Labs  Lab 11/05/21 0142 11/06/21 0122 10/29/2021 0213  NA 132* 129* 129*  K 3.0* 3.9 4.1  CL 104 103 102  CO2 18* 17* 17*  GLUCOSE 153* 201* 245*  BUN 50* 62* 81*  CREATININE 1.75* 2.39* 3.10*  CALCIUM 7.5* 7.4* 7.3*  PROT 4.6* 4.6* 4.6*  ALBUMIN 2.0* 2.0* 1.9*  AST 75* 69* 84*  ALT 13 9 7   ALKPHOS 83 88 90  BILITOT 0.6 0.5 0.6  GFRNONAA 39* 27* 20*  ANIONGAP 10 9 10      Hematology Recent Labs  Lab 11/05/21 0142 11/06/21 0122 11/05/2021 0213  WBC 3.6* 5.3 9.8  RBC 2.52* 2.38* 2.93*  HGB 7.9* 7.5* 8.9*  HCT 22.9* 21.5* 26.5*  MCV 90.9 90.3 90.4  MCH 31.3 31.5 30.4  MCHC 34.5 34.9 33.6  RDW 13.9 14.3 15.2  PLT 40* 45* 56*    Cardiac EnzymesNo results for input(s): TROPONINI in the last 168 hours. No results for input(s): TROPIPOC in the last 168 hours.   BNPNo results for input(s): BNP, PROBNP in the last 168 hours.   DDimer No results for input(s): DDIMER in the last 168 hours.   Radiology    No results found.  Cardiac Studies   none  Patient Profile     80 y.o. 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   Assessment & Plan    Recurrent bacteremia - he will undergo PM system extraction and insertion of a temporary perm PM today. Continue IV anti-biotics Worsening renal dysfunction - this is associated with a reduced PM rate. We have turned his rate up to 60 and will keep at 60-70 post op until a new device is placed. Could be immune complex mediated as well though I suspect more pre-renal. 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 4. Anemia - he has been  transfused and may require more blood.    For questions or updates, please contact [place] Please consult [email or web address] for contact info under Cardiology/STEMI.      Signed, Cristopher Peru, MD  10/31/2021, 7:35 AM

## 2021-11-07 NOTE — Interval H&P Note (Signed)
History and Physical Interval Note:  10/18/2021 7:40 AM  Nathan Singleton  has presented today for surgery, with the diagnosis of BACTEREMIA.  The various methods of treatment have been discussed with the patient and family. After consideration of risks, benefits and other options for treatment, the patient has consented to  Procedure(s): PACEMAKER EXTRACTION (N/A) TRANSESOPHAGEAL ECHOCARDIOGRAM (TEE) (N/A) as a surgical intervention.  The patient's history has been reviewed, patient examined, no change in status, stable for surgery.  I have reviewed the patient's chart and labs.  Questions were answered to the patient's satisfaction.     Cristopher Peru

## 2021-11-07 NOTE — Anesthesia Postprocedure Evaluation (Signed)
Anesthesia Post Note  Patient: Nathan Singleton  Procedure(s) Performed: PACEMAKER EXTRACTION WITH INSERTION OF TEMPORARY PACING LEAD TRANSESOPHAGEAL ECHOCARDIOGRAM (TEE)     Patient location during evaluation: PACU Anesthesia Type: General Level of consciousness: awake and alert Pain management: pain level controlled Vital Signs Assessment: post-procedure vital signs reviewed and stable Respiratory status: spontaneous breathing, nonlabored ventilation, respiratory function stable and patient connected to nasal cannula oxygen Cardiovascular status: blood pressure returned to baseline and stable Postop Assessment: no apparent nausea or vomiting Anesthetic complications: no   No notable events documented.  Last Vitals:  Vitals:   10/20/2021 1202 11/03/2021 1219  BP: 114/86 104/65  Pulse: 80 80  Resp: (!) 26 20  Temp: (!) 36.1 C 36.7 C  SpO2: 100% 100%    Last Pain:  Vitals:   10/19/2021 1219  TempSrc: Oral  PainSc:                  Tiajuana Amass

## 2021-11-07 NOTE — CV Procedure (Signed)
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 The anesthesia service was used to provide general anesthesia and invasive arterial monitoring with a right radial art line. A 7FR sheath was inserted into the right femoral vein and a temporary pacemaker was advanced under fluor into the RV and pacing was assured. The patient did not have an escape rhythm. The left IJ was punctured and a guide wire advanced into the RA.   Attention was then turned to the PM system. A 5 cm incision was carried out. Electrocautery was used to dissect down to the pacemaker pocket and the generator was removed. The leads and sewing sleeve were freed up from there dense fibrous adhesions. A stylet was inserted into the RA lead and the helix retracted. Traction was placed on the lead and it was removed with gentle traction. Attention was then turned to the RV lead. The stylet was inserted into the body of the lead and the helix retracted. Traction was placed on the lead and the lead was removed in total with no hemodynamic sequelae. Pressure was held and hemostatsis assured. At this point the [employer] Tendril temp perm lead was advanced into the left IJ and into the RV under fluoroscopic guidance. The lead was actively fixed where the threshold was 1.2 V at 0.5 ms and the pacing impedence was 600 ohms. R waves measured 8 mV. The lead was fixed to the skin with silk suture. The PM was attached to the RV lead and and sewn to the skin. A bandage was applied and the patient returned to the recovery area in stable condition. The PM was programmed VVI 80/min. The wound was closed with multiple proline mattress  sutures.   Complications: none immediately  Conclusion: successful extraction of a DDD PM due to recurrent staph bacteremia and insertion of a temp/perm PM in a patient with CHB and no escape.   Carleene Overlie Wes [place],MD

## 2021-11-07 NOTE — Anesthesia Procedure Notes (Signed)
Arterial Line Insertion Start/End11/10/2021 8:45 AM, 11/01/2021 9:00 AM Performed by: Imagene Riches, CRNA, CRNA  Patient location: Pre-op. Preanesthetic checklist: patient identified, IV checked, site marked, risks and benefits discussed, surgical consent, monitors and equipment checked, pre-op evaluation and timeout performed Right, radial was placed Catheter size: 20 G Hand hygiene performed  and maximum sterile barriers used  Allen's test indicative of satisfactory collateral circulation Attempts: 1 Procedure performed using ultrasound guided technique. Ultrasound Notes:anatomy identified, needle tip was noted to be adjacent to the nerve/plexus identified and no ultrasound evidence of intravascular and/or intraneural injection Following insertion, dressing applied and Biopatch. Patient tolerated the procedure well with no immediate complications.

## 2021-11-07 NOTE — Anesthesia Preprocedure Evaluation (Signed)
Anesthesia Evaluation  Patient identified by MRN, date of birth, ID band Patient awake    Reviewed: Allergy & Precautions, NPO status , Patient's Chart, lab work & pertinent test results  Airway Mallampati: II  TM Distance: >3 FB Neck ROM: Full    Dental  (+) Edentulous Upper   Pulmonary sleep apnea , COPD, former smoker,    breath sounds clear to auscultation- rhonchi       Cardiovascular hypertension, Pt. on medications and Pt. on home beta blockers + CAD, + Past MI and + Peripheral Vascular Disease  + dysrhythmias + pacemaker  Rhythm:Regular Rate:Normal     Neuro/Psych negative neurological ROS     GI/Hepatic Neg liver ROS, PUD, GERD  ,  Endo/Other  diabetes, Type 2, Insulin Dependent  Renal/GU CRFRenal disease     Musculoskeletal  (+) Arthritis ,   Abdominal   Peds  Hematology  (+) anemia ,   Anesthesia Other Findings   Reproductive/Obstetrics                             Lab Results  Component Value Date   WBC 9.8 10/21/2021   HGB 8.9 (L) 10/25/2021   HCT 26.5 (L) 11/03/2021   MCV 90.4 11/02/2021   PLT 56 (L) 11/06/2021   Lab Results  Component Value Date   CREATININE 3.10 (H) 10/18/2021   BUN 81 (H) 10/17/2021   NA 129 (L) 11/12/2021   K 4.1 11/06/2021   CL 102 10/18/2021   CO2 17 (L) 11/06/2021    Anesthesia Physical Anesthesia Plan  ASA: 3  Anesthesia Plan: General   Post-op Pain Management: Minimal or no pain anticipated   Induction: Intravenous  PONV Risk Score and Plan: 2 and Dexamethasone, Ondansetron and Treatment may vary due to age or medical condition  Airway Management Planned: Oral ETT  Additional Equipment: Arterial line and TEE  Intra-op Plan:   Post-operative Plan: Extubation in OR  Informed Consent: I have reviewed the patients History and Physical, chart, labs and discussed the procedure including the risks, benefits and alternatives for  the proposed anesthesia with the patient or authorized representative who has indicated his/her understanding and acceptance.     Dental advisory given  Plan Discussed with: CRNA  Anesthesia Plan Comments:         Anesthesia Quick Evaluation

## 2021-11-07 NOTE — Discharge Instructions (Addendum)
Post procedure wound care instructions left chest (pacemaker removal site) Keep incision clean and dry, no showers. Leave steri-strips (little pieces of tape) on until seen in the office for wound check appointment. Call the office [phone]) for redness, drainage, swelling, or fever.  Left neck, wound care instructions (temporary pacemaker) Leave dressing in place, do not remove or change Call the office [phone]) for any concerns, redness, drainage, swelling, or fever.

## 2021-11-07 NOTE — Progress Notes (Signed)
PROGRESS NOTE    [name]  GBT:[id number] DOB: 01/09/1941 DOA: 11/03/2021 PCP: Ranae Plumber, PA   Brief Narrative:  Nathan Singleton is a pleasant 80 y.o. WM PMHx coronary artery disease, SSS with pacemaker, AS s/p TAVR,insulin-dependent diabetes mellitus, CKD stage IIIa,  and 2 episodes of MSSA bacteremia earlier this year attributed to right foot infection and managed with second toe amputation and 6 weeks IV cefazolin, now with his third episode of MSSA bacteremia and transferred from [facility] for pacemaker extraction.    [facility] Hospital Course: Patient was seen in the [facility] ED on 10/30/2021 with fevers, had blood cultures collected, and went home with Keflex.  He continued to have fever to 103 F at home that night, the cultures started growing gram-positive cocci, and he came back into the [facility] ED the following day where he had fluid responsive hypotension and-year-old was admitted (11/18 [facility]).   ID provided consultation for the recurrent bacteremia which ended up being MSSA again, patient underwent TEE on 11/03/2021 with no evidence for endocarditis.  He was continued on IV cefazolin, transferred to [facility] for extraction of pacemaker per ID recommendation, and will need long-term suppressive therapy in light of his prosthetic AV.   He appears to have chronic intermittent thrombocytopenia, platelet count 70 on admission, then decreased to the 40 range where it remained stable for the 4 days leading up to transfer.  Plavix and pharmacologic VTE prophylaxis were held for this reason and he was continued on baby aspirin.   Troponin was elevated to the 200s on admission, he was evaluated by cardiology, had preserved EF on echo, and this was attributed to demand ischemia in the setting of sepsis with hypotension and renal insufficiency.   Transaminases were elevated during the admission with normal alkaline phosphatase and normal bilirubin.  Statin was held and transaminases have been  trending down.   He developed loose stools without abdominal pain or leukocytosis. This was felt to be related to antibiotics, laxatives were held, and probiotic started.    Subjective: 11/25 afebrile overnight, sleepy but arousable having just returned from Cath Lab.   Assessment & Plan:  Covid vaccination; vaccinated 3/3  Principal Problem:   MSSA bacteremia Active Problems:   Coronary artery disease involving coronary bypass graft of native heart with angina pectoris (HCC)   S/P TAVR (transcatheter aortic valve replacement)   Thrombocytopenia (HCC)   Anemia of chronic disease   CKD stage G3a/A3, GFR 45-59 and albumin creatinine ratio >300 mg/g (HCC)   Type 2 diabetes mellitus with diabetic chronic kidney disease (HCC)   CAD (coronary artery disease)   Hypokalemia   Elevated transaminase level   Sick sinus syndrome ([place])   Diabetes mellitus type 2, controlled, with complications ([place])   Diabetic nephropathy ([place])   Acute urinary retention  MSSA bacteremia  - Admitted to [facility] on 10/31/21 with 3rd episode of MSSA bacteremia in a yr, seen by ID, started on cefazolin, had no evidence for endocarditis on TEE 11/03/21, and was transferred to [facility] for pacemaker extraction  - Blood cultures repeated 10/22/2021  - Cardiology coordinating pacemaker extraction, continue cefazolin, will need long-term suppressive antibiotics per ID at [facility]   -11/23 spoke with card master patient is on their list to be seen today.  Explained to patient and wife will be up to cardiology for exact timing when they remove pacer given his bacteremia, and how long he will have to remain on antibiotics prior to them reinserting new pacer. -  11/24 per ID note from last night on 11/23 In the setting of retained TAVR will need suppressive antibiotics after completion of at least 6 weeks of IV cefazolin -11/25 s/p pacer removal with implantation of temporary pacer left chest wall   Thrombocytopenia - Platelets 70 on  admission, stable in 40-range for the 4 days leading up to transfer to [facility]  - Hx of intermittent thrombocytopenia going back at least a yr   -No sign of overt bleeding - Continue to hold Plavix and pharmacologic VTE ppx, continue daily CBC   Platelets 150 - 400 K/uL 56 Low   45 Low  CM  40 Low  CM  42 Low  CM  43 Low  CM  41 Low  CM  37 Low  CM      Elevated transaminase -Resolving - Continue to hold statin   Sick sinus syndrome, s/p pacemaker -Plan extraction of pacemaker by cardiology - Per cardiology hemoglobin goal> 9 - Strict in and out +1.5 L - Daily weight Filed Weights   11/05/21 0500 11/06/21 0341 10/29/2021 0348  Weight: 104.2 kg 107.6 kg 112 kg      CAD - No anginal complaints  - Troponin was elevated to 200s on admission to [facility] on 11/18, seen by cardiology there, had preserved EF on echo, and was attributed to demand ishcemia in setting of bacteremia with hypotension, renal insufficiency, and anemia  - Plavix held in light of low platelets, statin on hold d/t transaminitis, continuing Coreg and ASA    AS s/p TAVR  - No evidence for endocarditis on TEE 11/03/21  -See MSSA bacteremia   DM type II controlled with complication/DM Nephropathy  - A1c was 6.9% in November 2022  -Semglee 11 units daily - 11/25 increase resistant SSI  Acute on CKD Stage IIIa (baseline Cr~1.59) - Monitor renal function closely Lab Results  Component Value Date   CREATININE 3.10 (H) 10/14/2021   CREATININE 2.39 (H) 11/06/2021   CREATININE 1.75 (H) 11/05/2021   CREATININE 1.59 (H) 11/03/2021   CREATININE 1.64 (H) 11/02/2021  -11/25 Foley placed yesterday secondary to urinary retention, sodium bicarb started today.  If renal function continues to deteriorate over the next several days will need to consult nephrology.  Expect that it should turn around.  Acute urinary Retention - Place Foley catheter for comfort. - 11/24 increase Flomax 0.4 mg BID  Hypokalemia -Potassium goal>  4  Hypomagnesmia - Magnesium goal> 2   Hypophosphatemia - Phosphorus goal> 2.5 - Sodium phosphate IV 40 mmol  Hypocalcemia - 11/24 corrected calcium= 9.0  Hyponatremia - 11/25 with patient's increasing acute renal failure, and decreasing HCO3 see normal saline -11/25 bicarb drip 74ml/hr     DVT prophylaxis: SCD Code Status: Full Family Communication: 11/25 wife at bedside for discussion of plan of care all questions answered Status is: Inpatient    Dispo: The patient is from: Home              Anticipated d/c is to: Home              Anticipated d/c date is: 3 days              Patient currently is not medically stable to d/c.      Consultants:  Cardiology   Procedures/Significant Events:  Echo 10/31/21  1. Left ventricular ejection fraction, by estimation, is 60 to 65%. The  left ventricle has normal function. The left ventricle has no regional  wall motion abnormalities.  There is mild left ventricular hypertrophy.  Left ventricular diastolic parameters  are consistent with Grade I diastolic dysfunction (impaired relaxation).   2. Right ventricular systolic function is normal. The right ventricular  size is normal. Tricuspid regurgitation signal is inadequate for assessing  PA pressure.   3. Left atrial size was mildly dilated.   4. The mitral valve is normal in structure. No evidence of mitral valve  regurgitation. No evidence of mitral stenosis.   5. The aortic valve was not well visualized. Aortic valve regurgitation  is not visualized. No aortic stenosis is present. Echo findings are  consistent with normal structure and function of the aortic valve  prosthesis. Aortic valve mean gradient measures   6.5 mmHg.  11/21 TEE   Left Ventricle: Left ventricular ejection fraction, by estimation, is 55  to 60%. The left ventricle has normal function. The left ventricular  internal cavity size was normal in size.   Right Ventricle: The right ventricular size  is normal. No increase in  right ventricular wall thickness. Right ventricular systolic function is  normal.   Left Atrium: Left atrial size was normal in size. No left atrial/left  atrial appendage thrombus was detected.   Right Atrium: Right atrial size was normal in size.   Pericardium: There is no evidence of pericardial effusion.   Mitral Valve: The mitral valve is normal in structure. Mild mitral valve  regurgitation.   Tricuspid Valve: The tricuspid valve is normal in structure. Tricuspid  valve regurgitation is mild.   Aortic Valve: The aortic valve has been repaired/replaced. Aortic valve  regurgitation is not visualized. There is a Sapien prosthetic, stented  (TAVR) valve present in the aortic position. Echo findings are consistent  with normal structure and function of   the aortic valve prosthesis.   Pulmonic Valve: The pulmonic valve was grossly normal. Pulmonic valve  regurgitation is not visualized.  -11/25 s/p pacer removal with implantation of temporary pacer left chest wall  I have personally reviewed and interpreted all radiology studies and my findings are as above.  VENTILATOR SETTINGS: 2 L O2 nasal cannula 11/25 SPO2 100%   Cultures 11/17 blood LEFT wrist positive MSSA 11/17 blood LEFT AC positive MSSA 11/18 urine insignificant growth 11/18 blood RIGHT wrist positive MSSA 11/20 blood LEFT wrist positive MSSA  11/22 blood LEFT hand NGTD 11/22 blood LEFT hand NGTD    Antimicrobials: Anti-infectives (From admission, onward)    Start     Ordered Stop   10/18/2021 2200  ceFAZolin (ANCEF) IVPB 2g/100 mL premix        11/01/2021 2035           Devices    LINES / TUBES:      Continuous Infusions:  sodium chloride 75 mL/hr at 10/17/2021 1230   [START ON 11/08/2021]  ceFAZolin (ANCEF) IV     sodium phosphate  Dextrose 5% IVPB       Objective: Vitals:   10/22/2021 1132 11/03/2021 1147 10/30/2021 1202 10/30/2021 1219  BP: (!) 141/106 (!) 135/122  114/86 104/65  Pulse: 80 82 80 80  Resp: (!) 24 (!) 22 (!) 26 20  Temp: (!) 97 F (36.1 C)  (!) 97 F (36.1 C) 98 F (36.7 C)  TempSrc:    Oral  SpO2: 100% 100% 100% 100%  Weight:      Height:        Intake/Output Summary (Last 24 hours) at 11/02/2021 1253 Last data filed at 10/28/2021 1139 Gross per 24 hour  Intake 2205 ml  Output 760 ml  Net 1445 ml    Filed Weights   11/05/21 0500 11/06/21 0341 10/22/2021 0348  Weight: 104.2 kg 107.6 kg 112 kg    Examination:  General: A/O x4, No acute respiratory distress Eyes: negative scleral hemorrhage, negative anisocoria, negative icterus ENT: Negative Runny nose, negative gingival bleeding, Neck:  Negative scars, masses, torticollis, lymphadenopathy, JVD Lungs: Clear to auscultation bilaterally without wheezes or crackles Cardiovascular: Regular rate and rhythm without murmur gallop or rub normal S1 and S2 left upper chest wall temporary pacer in place negative sign of bleeding.  Patient's left arm in sling, comfortable Abdomen: negative abdominal pain, nondistended, positive soft, bowel sounds, no rebound, no ascites, no appreciable mass Extremities: No significant cyanosis, clubbing, or edema bilateral lower extremities Skin: Negative rashes, lesions, ulcers Psychiatric:  Negative depression, negative anxiety, negative fatigue, negative mania  Central nervous system:  Cranial nerves II through XII intact, tongue/uvula midline, all extremities muscle strength 5/5, sensation intact throughout, negative dysarthria, negative expressive aphasia, negative receptive aphasia.  .     Data Reviewed: Care during the described time interval was provided by me .  I have reviewed this patient's available data, including medical history, events of note, physical examination, and all test results as part of my evaluation.   CBC: Recent Labs  Lab 11/03/21 0536 10/16/2021 1121 11/05/21 0142 11/06/21 0122 10/26/2021 0213  WBC 4.2 4.0 3.6* 5.3  9.8  NEUTROABS  --  3.2 2.6 4.2 8.7*  HGB 8.6* 8.3* 7.9* 7.5* 8.9*  HCT 25.2* 23.9* 22.9* 21.5* 26.5*  MCV 89.4 91.2 90.9 90.3 90.4  PLT 43* 42* 40* 45* 56*    Basic Metabolic Panel: Recent Labs  Lab 11/02/21 0553 11/03/21 0536 11/05/21 0142 11/06/21 0122 11/03/2021 0213  NA 132* 135 132* 129* 129*  K 3.4* 3.3* 3.0* 3.9 4.1  CL 104 104 104 103 102  CO2 23 22 18* 17* 17*  GLUCOSE 108* 174* 153* 201* 245*  BUN 47* 44* 50* 62* 81*  CREATININE 1.64* 1.59* 1.75* 2.39* 3.10*  CALCIUM 7.8* 7.8* 7.5* 7.4* 7.3*  MG  --   --  1.7 2.3 2.1  PHOS  --   --   --  1.9* 2.9    GFR: Estimated Creatinine Clearance: 26.5 mL/min (A) (by C-G formula based on SCr of 3.1 mg/dL (H)). Liver Function Tests: Recent Labs  Lab 11/01/21 0736 11/02/21 0553 11/05/21 0142 11/06/21 0122 10/21/2021 0213  AST 219* 191* 75* 69* 84*  ALT 141* 75* 13 9 7   ALKPHOS 60 65 83 88 90  BILITOT 0.5 0.3 0.6 0.5 0.6  PROT 5.4* 5.7* 4.6* 4.6* 4.6*  ALBUMIN 2.5* 2.7* 2.0* 2.0* 1.9*    No results for input(s): LIPASE, AMYLASE in the last 168 hours. No results for input(s): AMMONIA in the last 168 hours. Coagulation Profile: Recent Labs  Lab 11/12/2021 1121  INR 1.6*    Cardiac Enzymes: No results for input(s): CKTOTAL, CKMB, CKMBINDEX, TROPONINI in the last 168 hours. BNP (last 3 results) No results for input(s): PROBNP in the last 8760 hours. HbA1C: No results for input(s): HGBA1C in the last 72 hours. CBG: Recent Labs  Lab 11/06/21 2118 10/15/2021 0102 11/09/2021 0451 11/03/2021 0745 11/06/2021 1138  GLUCAP 372* 262* 207* 131* 160*    Lipid Profile: No results for input(s): CHOL, HDL, LDLCALC, TRIG, CHOLHDL, LDLDIRECT in the last 72 hours. Thyroid Function Tests: No results for input(s): TSH, T4TOTAL, FREET4, T3FREE, THYROIDAB in the last 72  hours. Anemia Panel: No results for input(s): VITAMINB12, FOLATE, FERRITIN, TIBC, IRON, RETICCTPCT in the last 72 hours.  Urine analysis:    Component Value  Date/Time   COLORURINE YELLOW (A) 10/31/2021 1707   APPEARANCEUR HAZY (A) 10/31/2021 1707   LABSPEC 1.017 10/31/2021 1707   PHURINE 5.0 10/31/2021 1707   GLUCOSEU 50 (A) 10/31/2021 1707   HGBUR SMALL (A) 10/31/2021 1707   BILIRUBINUR NEGATIVE 10/31/2021 1707   KETONESUR NEGATIVE 10/31/2021 1707   PROTEINUR 100 (A) 10/31/2021 1707   NITRITE NEGATIVE 10/31/2021 1707   LEUKOCYTESUR NEGATIVE 10/31/2021 1707   Sepsis Labs: @LABRCNTIP (procalcitonin:4,lacticidven:4)  ) Recent Results (from the past 240 hour(s))  Culture, blood (Routine x 2)     Status: Abnormal   Collection Time: 10/30/21  9:51 AM   Specimen: BLOOD  Result Value Ref Range Status   Specimen Description   Final    BLOOD LEFT ANTECUBITAL Performed at [facility], [address], [place], [place] [address]    Special Requests   Final    BOTTLES DRAWN AEROBIC AND ANAEROBIC Blood Culture adequate volume Performed at [facility], [address] [place], [place] [address]    Culture  Setup Time   Final    GRAM POSITIVE COCCI IN BOTH AEROBIC AND ANAEROBIC BOTTLES CRITICAL VALUE NOTED.  VALUE IS CONSISTENT WITH PREVIOUSLY REPORTED AND CALLED VALUE. Performed at [facility], [address] [place], [place] [address]    Culture (A)  Final    STAPHYLOCOCCUS AUREUS SUSCEPTIBILITIES PERFORMED ON PREVIOUS CULTURE WITHIN THE LAST 5 DAYS. Performed at [facility], [place] [address] [place], [place] [address]    Report Status 11/02/2021 FINAL  Final  Culture, blood (Routine x 2)     Status: Abnormal   Collection Time: 10/30/21 10:02 AM   Specimen: BLOOD  Result Value Ref Range Status   Specimen Description   Final    BLOOD LEFT WRIST Performed at [facility], [address] [place], [place] [address]    Special Requests   Final    BOTTLES DRAWN AEROBIC AND ANAEROBIC Blood Culture results may not be optimal due to an excessive volume of blood received in culture  bottles Performed at [facility], [address], [place], [place] [address]    Culture  Setup Time   Final    GRAM POSITIVE COCCI IN BOTH AEROBIC AND ANAEROBIC BOTTLES CRITICAL RESULT CALLED TO, READ BACK BY AND VERIFIED WITH: Leonides Schanz RN 1017 10/30/21 HNM Performed at [facility], [place] [address], [place], [place] [address]    Culture STAPHYLOCOCCUS AUREUS (A)  Final   Report Status 11/02/2021 FINAL  Final   Organism ID, Bacteria STAPHYLOCOCCUS AUREUS  Final      Susceptibility   Staphylococcus aureus - MIC*    CIPROFLOXACIN <=0.5 SENSITIVE Sensitive     ERYTHROMYCIN >=8 RESISTANT Resistant     GENTAMICIN <=0.5 SENSITIVE Sensitive     OXACILLIN 0.5 SENSITIVE Sensitive     TETRACYCLINE <=1 SENSITIVE Sensitive     VANCOMYCIN <=0.5 SENSITIVE Sensitive     TRIMETH/SULFA <=10 SENSITIVE Sensitive     CLINDAMYCIN RESISTANT Resistant     RIFAMPIN <=0.5 SENSITIVE Sensitive     Inducible Clindamycin POSITIVE Resistant     * STAPHYLOCOCCUS AUREUS  Blood Culture ID Panel (Reflexed)     Status: Abnormal   Collection Time: 10/30/21 10:02 AM  Result Value Ref Range Status   Enterococcus faecalis NOT DETECTED NOT DETECTED Final  Enterococcus Faecium NOT DETECTED NOT DETECTED Final   Listeria monocytogenes NOT DETECTED NOT DETECTED Final   Staphylococcus species DETECTED (A) NOT DETECTED Final    Comment: CRITICAL RESULT CALLED TO, READ BACK BY AND VERIFIED WITH: Leonides Schanz RN 2239 10/30/21 HNM    Staphylococcus aureus (BCID) DETECTED (A) NOT DETECTED Final    Comment: CRITICAL RESULT CALLED TO, READ BACK BY AND VERIFIED WITH: Heath 2239 10/30/21 HNM    Staphylococcus epidermidis NOT DETECTED NOT DETECTED Final   Staphylococcus lugdunensis NOT DETECTED NOT DETECTED Final   Streptococcus species NOT DETECTED NOT DETECTED Final   Streptococcus agalactiae NOT DETECTED NOT DETECTED Final   Streptococcus pneumoniae NOT DETECTED NOT DETECTED Final    Streptococcus pyogenes NOT DETECTED NOT DETECTED Final   A.calcoaceticus-baumannii NOT DETECTED NOT DETECTED Final   Bacteroides fragilis NOT DETECTED NOT DETECTED Final   Enterobacterales NOT DETECTED NOT DETECTED Final   Enterobacter cloacae complex NOT DETECTED NOT DETECTED Final   Escherichia coli NOT DETECTED NOT DETECTED Final   Klebsiella aerogenes NOT DETECTED NOT DETECTED Final   Klebsiella oxytoca NOT DETECTED NOT DETECTED Final   Klebsiella pneumoniae NOT DETECTED NOT DETECTED Final   Proteus species NOT DETECTED NOT DETECTED Final   Salmonella species NOT DETECTED NOT DETECTED Final   Serratia marcescens NOT DETECTED NOT DETECTED Final   Haemophilus influenzae NOT DETECTED NOT DETECTED Final   Neisseria meningitidis NOT DETECTED NOT DETECTED Final   Pseudomonas aeruginosa NOT DETECTED NOT DETECTED Final   Stenotrophomonas maltophilia NOT DETECTED NOT DETECTED Final   Candida albicans NOT DETECTED NOT DETECTED Final   Candida auris NOT DETECTED NOT DETECTED Final   Candida glabrata NOT DETECTED NOT DETECTED Final   Candida krusei NOT DETECTED NOT DETECTED Final   Candida parapsilosis NOT DETECTED NOT DETECTED Final   Candida tropicalis NOT DETECTED NOT DETECTED Final   Cryptococcus neoformans/gattii NOT DETECTED NOT DETECTED Final   Meth resistant mecA/C and MREJ NOT DETECTED NOT DETECTED Final    Comment: Performed at [facility], [address] [place], [place] [address]  Resp Panel by RT-PCR (Flu A&B, Covid) Nasopharyngeal Swab     Status: None   Collection Time: 10/30/21 12:14 PM   Specimen: Nasopharyngeal Swab; Nasopharyngeal(NP) swabs in vial transport medium  Result Value Ref Range Status   SARS Coronavirus 2 by RT PCR NEGATIVE NEGATIVE Final    Comment: (NOTE) SARS-CoV-2 target nucleic acids are NOT DETECTED.  The SARS-CoV-2 RNA is generally detectable in upper respiratory specimens during the acute phase of infection. The lowest concentration of  SARS-CoV-2 viral copies this assay can detect is 138 copies/mL. A negative result does not preclude SARS-Cov-2 infection and should not be used as the sole basis for treatment or other patient management decisions. A negative result may occur with  improper specimen collection/handling, submission of specimen other than nasopharyngeal swab, presence of viral mutation(s) within the areas targeted by this assay, and inadequate number of viral copies(<138 copies/mL). A negative result must be combined with clinical observations, patient history, and epidemiological information. The expected result is Negative.  Fact Sheet for Patients:  EntrepreneurPulse.com.au  Fact Sheet for Healthcare Providers:  IncredibleEmployment.be  This test is no t yet approved or cleared by the [place] FDA and  has been authorized for detection and/or diagnosis of SARS-CoV-2 by FDA under an Emergency Use Authorization (EUA). This EUA will remain  in effect (meaning this test can be used) for the duration of the COVID-19 declaration under  Section 564(b)(1) of the Act, 21 U.S.C.section 360bbb-3(b)(1), unless the authorization is terminated  or revoked sooner.       Influenza A by PCR NEGATIVE NEGATIVE Final   Influenza B by PCR NEGATIVE NEGATIVE Final    Comment: (NOTE) The Xpert Xpress SARS-CoV-2/FLU/RSV plus assay is intended as an aid in the diagnosis of influenza from Nasopharyngeal swab specimens and should not be used as a sole basis for treatment. Nasal washings and aspirates are unacceptable for Xpert Xpress SARS-CoV-2/FLU/RSV testing.  Fact Sheet for Patients: EntrepreneurPulse.com.au  Fact Sheet for Healthcare Providers: IncredibleEmployment.be  This test is not yet approved or cleared by the [place] FDA and has been authorized for detection and/or diagnosis of SARS-CoV-2 by FDA under an Emergency Use  Authorization (EUA). This EUA will remain in effect (meaning this test can be used) for the duration of the COVID-19 declaration under Section 564(b)(1) of the Act, 21 U.S.C. section 360bbb-3(b)(1), unless the authorization is terminated or revoked.  Performed at [facility], [address] [place], [place] [address]   Urine Culture     Status: Abnormal   Collection Time: 10/31/21 10:00 AM   Specimen: Urine, Random  Result Value Ref Range Status   Specimen Description   Final    URINE, RANDOM Performed at [facility], [address], [place], [place] [address]    Special Requests   Final    NONE Performed at [facility], [address] [place], [place] [address]    Culture (A)  Final    <10,000 COLONIES/mL INSIGNIFICANT GROWTH Performed at [facility], [place] [address] [place], [place] [address]    Report Status 11/02/2021 FINAL  Final  Resp Panel by RT-PCR (Flu A&B, Covid) Nasopharyngeal Swab     Status: None   Collection Time: 10/31/21 10:11 AM   Specimen: Nasopharyngeal Swab; Nasopharyngeal(NP) swabs in vial transport medium  Result Value Ref Range Status   SARS Coronavirus 2 by RT PCR NEGATIVE NEGATIVE Final    Comment: (NOTE) SARS-CoV-2 target nucleic acids are NOT DETECTED.  The SARS-CoV-2 RNA is generally detectable in upper respiratory specimens during the acute phase of infection. The lowest concentration of SARS-CoV-2 viral copies this assay can detect is 138 copies/mL. A negative result does not preclude SARS-Cov-2 infection and should not be used as the sole basis for treatment or other patient management decisions. A negative result may occur with  improper specimen collection/handling, submission of specimen other than nasopharyngeal swab, presence of viral mutation(s) within the areas targeted by this assay, and inadequate number of viral copies(<138 copies/mL). A negative result must be combined with clinical  observations, patient history, and epidemiological information. The expected result is Negative.  Fact Sheet for Patients:  EntrepreneurPulse.com.au  Fact Sheet for Healthcare Providers:  IncredibleEmployment.be  This test is no t yet approved or cleared by the [place] FDA and  has been authorized for detection and/or diagnosis of SARS-CoV-2 by FDA under an Emergency Use Authorization (EUA). This EUA will remain  in effect (meaning this test can be used) for the duration of the COVID-19 declaration under Section 564(b)(1) of the Act, 21 U.S.C.section 360bbb-3(b)(1), unless the authorization is terminated  or revoked sooner.       Influenza A by PCR NEGATIVE NEGATIVE Final   Influenza B by PCR NEGATIVE NEGATIVE Final    Comment: (NOTE) The Xpert Xpress SARS-CoV-2/FLU/RSV plus assay is intended as an aid in the diagnosis of influenza from Nasopharyngeal swab specimens  and should not be used as a sole basis for treatment. Nasal washings and aspirates are unacceptable for Xpert Xpress SARS-CoV-2/FLU/RSV testing.  Fact Sheet for Patients: EntrepreneurPulse.com.au  Fact Sheet for Healthcare Providers: IncredibleEmployment.be  This test is not yet approved or cleared by the [place] FDA and has been authorized for detection and/or diagnosis of SARS-CoV-2 by FDA under an Emergency Use Authorization (EUA). This EUA will remain in effect (meaning this test can be used) for the duration of the COVID-19 declaration under Section 564(b)(1) of the Act, 21 U.S.C. section 360bbb-3(b)(1), unless the authorization is terminated or revoked.  Performed at [facility], [address] [place], [place] [address]   Blood Culture (routine x 2)     Status: Abnormal   Collection Time: 10/31/21 10:13 AM   Specimen: BLOOD  Result Value Ref Range Status   Specimen Description   Final    BLOOD RIGHT  WRIST Performed at [facility], [address], [place], [place] [address]    Special Requests   Final    BOTTLES DRAWN AEROBIC AND ANAEROBIC Blood Culture adequate volume Performed at [facility], [address] [place], [place] [address]    Culture  Setup Time   Final    GRAM POSITIVE COCCI IN BOTH AEROBIC AND ANAEROBIC BOTTLES CRITICAL RESULT CALLED TO, READ BACK BY AND VERIFIED WITH: SHEEMA HALLAGI AT 1900 ON 10/31/21 BY SS    Culture (A)  Final    STAPHYLOCOCCUS AUREUS SUSCEPTIBILITIES PERFORMED ON PREVIOUS CULTURE WITHIN THE LAST 5 DAYS. Performed at [facility], [place] [address] [place], [place] [address]    Report Status 11/02/2021 FINAL  Final  Blood Culture (routine x 2)     Status: Abnormal   Collection Time: 10/31/21 10:13 AM   Specimen: BLOOD  Result Value Ref Range Status   Specimen Description   Final    BLOOD LEFT WRIST Performed at [facility], [address], [place], [place] [address]    Special Requests   Final    BOTTLES DRAWN AEROBIC AND ANAEROBIC BCAV Performed at [facility], [address], [place], [place] [address]    Culture  Setup Time   Final    GRAM POSITIVE COCCI IN BOTH AEROBIC AND ANAEROBIC BOTTLES CRITICAL VALUE NOTED.  VALUE IS CONSISTENT WITH PREVIOUSLY REPORTED AND CALLED VALUE. Performed at [facility], [address] [place], [place] [address]    Culture (A)  Final    STAPHYLOCOCCUS AUREUS SUSCEPTIBILITIES PERFORMED ON PREVIOUS CULTURE WITHIN THE LAST 5 DAYS. Performed at [facility], [place] [address] [place],  [address]    Report Status 11/02/2021 FINAL  Final  Blood Culture ID Panel (Reflexed)     Status: Abnormal   Collection Time: 10/31/21 10:13 AM  Result Value Ref Range Status   Enterococcus faecalis NOT DETECTED NOT DETECTED Final   Enterococcus Faecium NOT DETECTED NOT DETECTED Final   Listeria monocytogenes NOT DETECTED NOT DETECTED Final    Staphylococcus species DETECTED (A) NOT DETECTED Final    Comment: CRITICAL RESULT CALLED TO, READ BACK BY AND VERIFIED WITH: SHEEMA HALLAGI AT 1900 ON 10/31/21 BY SS    Staphylococcus aureus (BCID) DETECTED (A) NOT DETECTED Final    Comment: CRITICAL RESULT CALLED TO, READ BACK BY AND VERIFIED WITH: SHEEMA HALLAGI AT 1900 ON 10/31/21 BY SS    Staphylococcus epidermidis NOT DETECTED NOT DETECTED Final   Staphylococcus lugdunensis NOT DETECTED NOT DETECTED Final  Streptococcus species NOT DETECTED NOT DETECTED Final   Streptococcus agalactiae NOT DETECTED NOT DETECTED Final   Streptococcus pneumoniae NOT DETECTED NOT DETECTED Final   Streptococcus pyogenes NOT DETECTED NOT DETECTED Final   A.calcoaceticus-baumannii NOT DETECTED NOT DETECTED Final   Bacteroides fragilis NOT DETECTED NOT DETECTED Final   Enterobacterales NOT DETECTED NOT DETECTED Final   Enterobacter cloacae complex NOT DETECTED NOT DETECTED Final   Escherichia coli NOT DETECTED NOT DETECTED Final   Klebsiella aerogenes NOT DETECTED NOT DETECTED Final   Klebsiella oxytoca NOT DETECTED NOT DETECTED Final   Klebsiella pneumoniae NOT DETECTED NOT DETECTED Final   Proteus species NOT DETECTED NOT DETECTED Final   Salmonella species NOT DETECTED NOT DETECTED Final   Serratia marcescens NOT DETECTED NOT DETECTED Final   Haemophilus influenzae NOT DETECTED NOT DETECTED Final   Neisseria meningitidis NOT DETECTED NOT DETECTED Final   Pseudomonas aeruginosa NOT DETECTED NOT DETECTED Final   Stenotrophomonas maltophilia NOT DETECTED NOT DETECTED Final   Candida albicans NOT DETECTED NOT DETECTED Final   Candida auris NOT DETECTED NOT DETECTED Final   Candida glabrata NOT DETECTED NOT DETECTED Final   Candida krusei NOT DETECTED NOT DETECTED Final   Candida parapsilosis NOT DETECTED NOT DETECTED Final   Candida tropicalis NOT DETECTED NOT DETECTED Final   Cryptococcus neoformans/gattii NOT DETECTED NOT DETECTED Final   Meth  resistant mecA/C and MREJ NOT DETECTED NOT DETECTED Final    Comment: Performed at [facility], [address] [place], [place] [address]  CULTURE, BLOOD (ROUTINE X 2) w Reflex to ID Panel     Status: None (Preliminary result)   Collection Time: 10/24/2021  3:42 AM   Specimen: BLOOD  Result Value Ref Range Status   Specimen Description BLOOD LEFT HAND  Final   Special Requests   Final    BOTTLES DRAWN AEROBIC AND ANAEROBIC Blood Culture results may not be optimal due to an excessive volume of blood received in culture bottles   Culture   Final    NO GROWTH 3 DAYS Performed at [facility], [address] [place], [place] [address]    Report Status PENDING  Incomplete  CULTURE, BLOOD (ROUTINE X 2) w Reflex to ID Panel     Status: None (Preliminary result)   Collection Time: 11/01/2021  3:48 AM   Specimen: BLOOD  Result Value Ref Range Status   Specimen Description BLOOD LEFT HAND  Final   Special Requests   Final    BOTTLES DRAWN AEROBIC AND ANAEROBIC Blood Culture results may not be optimal due to an excessive volume of blood received in culture bottles   Culture   Final    NO GROWTH 3 DAYS Performed at [facility], [address] [place], [place] [address]    Report Status PENDING  Incomplete  Surgical pcr screen     Status: Abnormal   Collection Time: 11/06/21 10:16 PM   Specimen: Nasal Mucosa; Nasal Swab  Result Value Ref Range Status   MRSA, PCR NEGATIVE NEGATIVE Final   Staphylococcus aureus POSITIVE (A) NEGATIVE Final    Comment: (NOTE) The Xpert SA Assay (FDA approved for NASAL specimens in patients 89 years of age and older), is one component of a comprehensive surveillance program. It is not intended to diagnose infection nor to guide or monitor treatment. Performed at [facility], [place] [address] [place], [place] [address]          Radiology Studies: DG C-Arm 1-60 Min-No  Report  Result Date: 10/21/2021 Fluoroscopy was  utilized by the requesting physician.  No radiographic interpretation.        Scheduled Meds:  sodium chloride   Intravenous Once   sodium chloride   Intravenous Once   allopurinol  100 mg Oral Daily   Chlorhexidine Gluconate Cloth  6 each Topical Daily   gabapentin  100 mg Oral Daily   insulin aspart  0-15 Units Subcutaneous Q4H   insulin glargine-yfgn  11 Units Subcutaneous QHS   mometasone-formoterol  2 puff Inhalation BID   mupirocin ointment  1 application Nasal BID   pantoprazole  40 mg Oral BID   rosuvastatin  40 mg Oral QHS   saccharomyces boulardii  250 mg Oral BID   sertraline  200 mg Oral Daily   sodium chloride flush  3 mL Intravenous Q12H   sucralfate  1 g Oral QID   tamsulosin  0.4 mg Oral BID   Continuous Infusions:  sodium chloride 75 mL/hr at 11/01/2021 1230   [START ON 11/08/2021]  ceFAZolin (ANCEF) IV     sodium phosphate  Dextrose 5% IVPB       LOS: 3 days   The patient is critically ill with multiple organ systems failure and requires high complexity decision making for assessment and support, frequent evaluation and titration of therapies, application of advanced monitoring technologies and extensive interpretation of multiple databases. Critical Care Time devoted to patient care services described in this note  Time spent: 40 minutes     Jaimen Melone, Geraldo Docker, MD [facility]   If 7PM-7AM, please contact night-coverage 10/27/2021, 12:53 PM

## 2021-11-07 NOTE — Transfer of Care (Signed)
Immediate Anesthesia Transfer of Care Note  Patient: Nathan Singleton  Procedure(s) Performed: PACEMAKER EXTRACTION WITH INSERTION OF TEMPORARY PACING LEAD TRANSESOPHAGEAL ECHOCARDIOGRAM (TEE)  Patient Location: PACU  Anesthesia Type:General  Level of Consciousness: awake and alert   Airway & Oxygen Therapy: Patient Spontanous Breathing and Patient connected to face mask oxygen  Post-op Assessment: Report given to RN and Post -op Vital signs reviewed and stable  Post vital signs: Reviewed and stable  Last Vitals:  Vitals Value Taken Time  BP 169/130 10/26/2021 1137  Temp    Pulse 78 10/23/2021 1142  Resp 27 10/21/2021 1142  SpO2 100 % 11/02/2021 1142  Vitals shown include unvalidated device data.  Last Pain:  Vitals:   10/19/2021 0748  TempSrc: Oral  PainSc:          Complications: No notable events documented.

## 2021-11-07 NOTE — Progress Notes (Deleted)
CBG 89 after 1/2 Amp. D10

## 2021-11-07 NOTE — Progress Notes (Signed)
Pharmacy Antibiotic Note  Nathan Singleton is a 80 y.o. male with past medical history of hypertension, diabetes, CAD, TAVR, pacemaker, CKD, and peripheral vascular disease who presents to the ED for abnormal labs. Pt was seen in ED 11/17 d/t fever and 1 week of worsening arm pain w/ skin tears on forearms. Pt was sent home on Keflex. Since then, pt blood cultures from 11/17 came back with 4/4 bottles positive for MSSA and pt was called back into ED for reevaluation. Pharmacy has been consulted for cefazolin dosing bacteremia.  TEE- no left atrial/left atrial appendage thrombus detected. On day 5 of antibiotics. Afebrile, WBC WNL. Scr 1.59 c/w BL.   Pt is s/p tx from [facility] for pacer removal here. Plan to continue cefazolin and will require likely lifelong suppression therapy. Scheduled for extraction today. Scr has worsened to 3.1. We will adjust dose of cefazolin.   Plan: Change cefazolin 2 grams IV q12h   Height: 6\' 4"  (193 cm) Weight: 112 kg (246 lb 14.6 oz) IBW/kg (Calculated) : 86.8  Temp (24hrs), Avg:98.4 F (36.9 C), Min:97.9 F (36.6 C), Max:98.8 F (37.1 C)  Recent Labs  Lab 10/31/21 1011 10/31/21 1013 11/02/21 0553 11/03/21 0536 11/02/2021 1121 11/05/21 0142 11/06/21 0122 11/05/2021 0213  WBC  --    < > 3.7* 4.2 4.0 3.6* 5.3 9.8  CREATININE  --    < > 1.64* 1.59*  --  1.75* 2.39* 3.10*  LATICACIDVEN 1.3  1.3  --   --   --   --   --   --   --    < > = values in this interval not displayed.     Estimated Creatinine Clearance: 26.5 mL/min (A) (by C-G formula based on SCr of 3.1 mg/dL (H)).    Allergies  Allergen Reactions   Atorvastatin Other (See Comments)    "bad for kidneys" Cramping    Celecoxib Other (See Comments)    Other reaction(s): Other (See Comments) Kidney Problem Kidney Problem    Dicyclomine Hcl Other (See Comments)    Stomach cramps   Doxycycline Other (See Comments)    Per wife (per RN)- pt gets severe stomach pains even when takes with food and  PCP said to not take     Metformin Other (See Comments)    Antimicrobials this admission: 11/18 cefazolin >>    Microbiology results: 11/17 Bcx: MSSA 4/4 11/18 Bcx: MSSA 4/4 11/22 blood>ngtd  Onnie Boer, PharmD, BCIDP, AAHIVP, CPP Infectious Disease Pharmacist 11/09/2021 8:22 AM

## 2021-11-07 NOTE — OR Nursing (Signed)
Manual pressure held on left chest site for 20 minutes and pressure dressing applied.

## 2021-11-07 NOTE — Progress Notes (Deleted)
  Echocardiogram 2D Echocardiogram has been performed.  Johny Chess 10/27/2021, 5:42 PM

## 2021-11-07 NOTE — Progress Notes (Addendum)
Patient arrived back to 4E from OR. VSS. CCMD notified. Patients temporary pacemaker left side with scant amount of drainage noted. Marked.   Daymon Larsen, RN

## 2021-11-07 NOTE — Anesthesia Procedure Notes (Signed)
Procedure Name: Intubation Date/Time: 11/02/2021 7:46 PM Performed by: Eligha Bridegroom, CRNA Pre-anesthesia Checklist: Patient identified, Emergency Drugs available, Suction available, Patient being monitored and Timeout performed Patient Re-evaluated:Patient Re-evaluated prior to induction Oxygen Delivery Method: Circle system utilized Preoxygenation: Pre-oxygenation with 100% oxygen Induction Type: IV induction Ventilation: Mask ventilation without difficulty Laryngoscope Size: Mac and 4 Grade View: Grade I Tube type: Oral Tube size: 8.0 mm Number of attempts: 1 Airway Equipment and Method: Stylet Placement Confirmation: ETT inserted through vocal cords under direct vision, positive ETCO2 and breath sounds checked- equal and bilateral Secured at: 22 cm Dental Injury: Teeth and Oropharynx as per pre-operative assessment

## 2021-11-07 NOTE — Progress Notes (Signed)
  Echocardiogram Echocardiogram Transesophageal has been performed.  Nathan Singleton 11/03/2021, 5:43 PM

## 2021-11-08 ENCOUNTER — Other Ambulatory Visit: Payer: Self-pay

## 2021-11-08 ENCOUNTER — Encounter (HOSPITAL_COMMUNITY): Payer: Self-pay | Admitting: Family Medicine

## 2021-11-08 DIAGNOSIS — N1831 Chronic kidney disease, stage 3a: Secondary | ICD-10-CM

## 2021-11-08 DIAGNOSIS — I442 Atrioventricular block, complete: Secondary | ICD-10-CM

## 2021-11-08 LAB — BPAM RBC
Blood Product Expiration Date: 202211302359
Blood Product Expiration Date: 202211302359
Blood Product Expiration Date: 202211302359
Blood Product Expiration Date: 202211302359
ISSUE DATE / TIME: 202211241609
ISSUE DATE / TIME: 202211242051
ISSUE DATE / TIME: 202211250903
ISSUE DATE / TIME: 202211250903
Unit Type and Rh: 9500
Unit Type and Rh: 9500
Unit Type and Rh: 9500
Unit Type and Rh: 9500

## 2021-11-08 LAB — GLUCOSE, CAPILLARY
Glucose-Capillary: 124 mg/dL — ABNORMAL HIGH (ref 70–99)
Glucose-Capillary: 125 mg/dL — ABNORMAL HIGH (ref 70–99)
Glucose-Capillary: 148 mg/dL — ABNORMAL HIGH (ref 70–99)
Glucose-Capillary: 198 mg/dL — ABNORMAL HIGH (ref 70–99)
Glucose-Capillary: 200 mg/dL — ABNORMAL HIGH (ref 70–99)
Glucose-Capillary: 224 mg/dL — ABNORMAL HIGH (ref 70–99)
Glucose-Capillary: 379 mg/dL — ABNORMAL HIGH (ref 70–99)

## 2021-11-08 LAB — TYPE AND SCREEN
ABO/RH(D): O NEG
ABO/RH(D): O NEG
Antibody Screen: NEGATIVE
Antibody Screen: NEGATIVE
Unit division: 0
Unit division: 0
Unit division: 0
Unit division: 0

## 2021-11-08 LAB — CBC
HCT: 26.6 % — ABNORMAL LOW (ref 39.0–52.0)
Hemoglobin: 9 g/dL — ABNORMAL LOW (ref 13.0–17.0)
MCH: 30.7 pg (ref 26.0–34.0)
MCHC: 33.8 g/dL (ref 30.0–36.0)
MCV: 90.8 fL (ref 80.0–100.0)
Platelets: 65 10*3/uL — ABNORMAL LOW (ref 150–400)
RBC: 2.93 MIL/uL — ABNORMAL LOW (ref 4.22–5.81)
RDW: 15.5 % (ref 11.5–15.5)
WBC: 10.8 10*3/uL — ABNORMAL HIGH (ref 4.0–10.5)
nRBC: 0 % (ref 0.0–0.2)

## 2021-11-08 LAB — COMPREHENSIVE METABOLIC PANEL
ALT: <5 U/L (ref 0–44)
AST: 56 U/L — ABNORMAL HIGH (ref 15–41)
Albumin: 2 g/dL — ABNORMAL LOW (ref 3.5–5.0)
Alkaline Phosphatase: 98 U/L (ref 38–126)
Anion gap: 10 (ref 5–15)
BUN: 84 mg/dL — ABNORMAL HIGH (ref 8–23)
CO2: 17 mmol/L — ABNORMAL LOW (ref 22–32)
Calcium: 7.5 mg/dL — ABNORMAL LOW (ref 8.9–10.3)
Chloride: 103 mmol/L (ref 98–111)
Creatinine, Ser: 2.73 mg/dL — ABNORMAL HIGH (ref 0.61–1.24)
GFR, Estimated: 23 mL/min — ABNORMAL LOW (ref >60)
Glucose, Bld: 222 mg/dL — ABNORMAL HIGH (ref 70–99)
Potassium: 4.2 mmol/L (ref 3.5–5.1)
Sodium: 130 mmol/L — ABNORMAL LOW (ref 135–145)
Total Bilirubin: 0.7 mg/dL (ref 0.3–1.2)
Total Protein: 4.7 g/dL — ABNORMAL LOW (ref 6.5–8.1)

## 2021-11-08 LAB — CBC WITH DIFFERENTIAL/PLATELET
Abs Immature Granulocytes: 0.13 10*3/uL — ABNORMAL HIGH (ref 0.00–0.07)
Basophils Absolute: 0 10*3/uL (ref 0.0–0.1)
Basophils Relative: 0 %
Eosinophils Absolute: 0 10*3/uL (ref 0.0–0.5)
Eosinophils Relative: 0 %
HCT: 25.8 % — ABNORMAL LOW (ref 39.0–52.0)
Hemoglobin: 9 g/dL — ABNORMAL LOW (ref 13.0–17.0)
Immature Granulocytes: 1 %
Lymphocytes Relative: 4 %
Lymphs Abs: 0.4 10*3/uL — ABNORMAL LOW (ref 0.7–4.0)
MCH: 31 pg (ref 26.0–34.0)
MCHC: 34.9 g/dL (ref 30.0–36.0)
MCV: 89 fL (ref 80.0–100.0)
Monocytes Absolute: 0.4 10*3/uL (ref 0.1–1.0)
Monocytes Relative: 4 %
Neutro Abs: 10 10*3/uL — ABNORMAL HIGH (ref 1.7–7.7)
Neutrophils Relative %: 91 %
Platelets: 66 10*3/uL — ABNORMAL LOW (ref 150–400)
RBC: 2.9 MIL/uL — ABNORMAL LOW (ref 4.22–5.81)
RDW: 15.3 % (ref 11.5–15.5)
WBC: 11 10*3/uL — ABNORMAL HIGH (ref 4.0–10.5)
nRBC: 0 % (ref 0.0–0.2)

## 2021-11-08 LAB — BPAM PLATELET PHERESIS
Blood Product Expiration Date: 202211272359
ISSUE DATE / TIME: 202211250903
Unit Type and Rh: 6200

## 2021-11-08 LAB — PREPARE PLATELET PHERESIS: Unit division: 0

## 2021-11-08 LAB — MAGNESIUM: Magnesium: 2.4 mg/dL (ref 1.7–2.4)

## 2021-11-08 LAB — PHOSPHORUS: Phosphorus: 3.4 mg/dL (ref 2.5–4.6)

## 2021-11-08 MED ORDER — SILVER NITRATE-POT NITRATE 75-25 % EX MISC
1.0000 | CUTANEOUS | Status: DC | PRN
  Filled 2021-11-08 (×11): qty 1

## 2021-11-08 MED ORDER — LIDOCAINE HCL 1 % IJ SOLN
5.0000 mL | Freq: Once | INTRAMUSCULAR | Status: DC
  Filled 2021-11-08: qty 5

## 2021-11-08 MED ORDER — SODIUM CHLORIDE 0.9% IV SOLUTION: Freq: Once | INTRAVENOUS | Status: AC

## 2021-11-08 MED ORDER — HYDROCODONE-ACETAMINOPHEN 5-325 MG PO TABS
2.0000 | ORAL_TABLET | Freq: Four times a day (QID) | ORAL | Status: DC | PRN
  Administered 2021-11-08: 2 via ORAL
  Filled 2021-11-08 (×2): qty 2

## 2021-11-08 MED ORDER — INSULIN ASPART 100 UNIT/ML IJ SOLN
10.0000 [IU] | Freq: Three times a day (TID) | INTRAMUSCULAR | Status: DC
Start: 2021-11-08 — End: 2021-11-09
  Administered 2021-11-08 – 2021-11-09 (×4): 10 [IU] via SUBCUTANEOUS

## 2021-11-08 MED ORDER — MORPHINE SULFATE (PF) 2 MG/ML IV SOLN
2.0000 mg | Freq: Once | INTRAVENOUS | Status: AC
  Administered 2021-11-08: 2 mg via INTRAVENOUS
  Filled 2021-11-08: qty 1

## 2021-11-08 MED ORDER — SODIUM CHLORIDE 0.9 % IV SOLN: INTRAVENOUS | Status: DC

## 2021-11-08 MED ORDER — LIDOCAINE HCL (PF) 1 % IJ SOLN
INTRAMUSCULAR | Status: AC
  Filled 2021-11-08: qty 5

## 2021-11-08 NOTE — Progress Notes (Signed)
Latest Reference Range & Units Most Recent 11/03/2021 16:00 11/03/2021 21:21 11/12/2021 22:07  Glucose-Capillary 70 - 99 mg/dL 381 (H) 11/12/2021 22:07 305 (H) 370 (H) 381 (H)  (H): Data is abnormally high  Dr. Sidney Ace, the on-call provider notified due to Pt blood glucose has been high. Pt has good appetite but complains of heart burn and nausea after eating his dinner.  Order received to stop IV continue infusion of 5% dextrose with sodium bicarb 150 mEq. We will monitor CMP at am and monitor q4 hrs blood glucose level. PRN Maalox and Zofran were also given indigestion and nausea.  Pt has thrombocytopenia. Left chest incision has some bright red drainage. Pressure dressing was applied. No significant active bleeding. Dr. Sidney Ace made aware. Pt is hemodynamically stable. We will continue to monitor.  Kennyth Lose, RN

## 2021-11-08 NOTE — Progress Notes (Signed)
Progress Note  Patient Name: Nathan Singleton Date of Encounter: 11/08/2021  [facility] Cardiologist: None   Subjective   NAEO.   Inpatient Medications    Scheduled Meds:  sodium chloride   Intravenous Once   sodium chloride   Intravenous Once   allopurinol  100 mg Oral Daily   Chlorhexidine Gluconate Cloth  6 each Topical Daily   gabapentin  100 mg Oral Daily   insulin aspart  0-20 Units Subcutaneous Q4H   insulin aspart  10 Units Subcutaneous TID WC   insulin glargine-yfgn  11 Units Subcutaneous QHS   mometasone-formoterol  2 puff Inhalation BID   mupirocin ointment  1 application Nasal BID   pantoprazole  40 mg Oral BID   rosuvastatin  40 mg Oral QHS   saccharomyces boulardii  250 mg Oral BID   sertraline  200 mg Oral Daily   sodium chloride flush  3 mL Intravenous Q12H   sucralfate  1 g Oral QID   tamsulosin  0.4 mg Oral BID   Continuous Infusions:  sodium chloride 10 mL/hr at 11/08/21 0045    ceFAZolin (ANCEF) IV 1 g (11/08/21 0825)   sodium bicarbonate 150 mEq in D5W infusion Stopped (11/08/21 0044)   sodium phosphate  Dextrose 5% IVPB     PRN Meds: acetaminophen **OR** acetaminophen, albuterol, alum & mag hydroxide-simeth, ondansetron (ZOFRAN) IV   Vital Signs    Vitals:   10/28/2021 2046 10/28/2021 2338 11/08/21 0428 11/08/21 0744  BP:  (!) 144/58 135/64 124/61  Pulse:  80 80 80  Resp:  20 20 20   Temp:  (!) 97.5 F (36.4 C) 97.9 F (36.6 C) 97.6 F (36.4 C)  TempSrc:  Oral Oral Oral  SpO2: 100% 100% 100% 100%  Weight:   112.7 kg   Height:        Intake/Output Summary (Last 24 hours) at 11/08/2021 0839 Last data filed at 11/08/2021 0430 Gross per 24 hour  Intake 1954.06 ml  Output 2110 ml  Net -155.94 ml   Last 3 Weights 11/08/2021 11/08/2021 11/06/2021  Weight (lbs) 248 lb 7.3 oz 246 lb 14.6 oz 237 lb 3.4 oz  Weight (kg) 112.7 kg 112 kg 107.6 kg      Telemetry    V pacing. Personally Reviewed  ECG    Personally Reviewed  Physical  Exam   GEN: No acute distress.   Neck: No JVD Cardiac: RRR, no murmurs, rubs, or gallops. L chest incision oozing. Temp-perm in place without hematoma on LIJ. Respiratory: Clear to auscultation bilaterally. GI: Soft, nontender, non-distended  MS: No edema; No deformity. Neuro:  Nonfocal  Psych: Normal affect   Labs    High Sensitivity Troponin:   Recent Labs  Lab 10/17/21 0726 10/17/21 1112 10/17/21 1305 10/31/21 1210 10/31/21 1502  TROPONINIHS 94* 89* 81* 277* 286*     Chemistry Recent Labs  Lab 11/06/21 0122 10/26/2021 0213 11/08/21 0431  NA 129* 129* 130*  K 3.9 4.1 4.2  CL 103 102 103  CO2 17* 17* 17*  GLUCOSE 201* 245* 222*  BUN 62* 81* 84*  CREATININE 2.39* 3.10* 2.73*  CALCIUM 7.4* 7.3* 7.5*  MG 2.3 2.1 2.4  PROT 4.6* 4.6* 4.7*  ALBUMIN 2.0* 1.9* 2.0*  AST 69* 84* 56*  ALT 9 7 <5  ALKPHOS 88 90 98  BILITOT 0.5 0.6 0.7  GFRNONAA 27* 20* 23*  ANIONGAP 9 10 10     Lipids No results for input(s): CHOL, TRIG, HDL, LABVLDL, LDLCALC, CHOLHDL  in the last 168 hours.  Hematology Recent Labs  Lab 11/06/21 0122 10/17/2021 0213 11/08/21 0431  WBC 5.3 9.8 11.0*  RBC 2.38* 2.93* 2.90*  HGB 7.5* 8.9* 9.0*  HCT 21.5* 26.5* 25.8*  MCV 90.3 90.4 89.0  MCH 31.5 30.4 31.0  MCHC 34.9 33.6 34.9  RDW 14.3 15.2 15.3  PLT 45* 56* 66*   Thyroid No results for input(s): TSH, FREET4 in the last 168 hours.  BNPNo results for input(s): BNP, PROBNP in the last 168 hours.  DDimer No results for input(s): DDIMER in the last 168 hours.   Radiology    DG C-Arm 1-60 Min-No Report  Result Date: 11/09/2021 Fluoroscopy was utilized by the requesting physician.  No radiographic interpretation.    Cardiac Studies   No new  Patient Profile     80 y.o. male  admitted with CIED associated infection, now s/p extraction.  Assessment & Plan    #Recurrent bacteremia S/P CIED extraction 10/25/2021. 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

## 2021-11-08 NOTE — Progress Notes (Signed)
PROGRESS NOTE    Nathan Singleton  JME:[id number] DOB: 1941-11-20 DOA:  PCP: Ranae Plumber, PA   Brief Narrative:  Nathan Singleton is a pleasant 80 y.o. WM PMHx coronary artery disease, SSS with pacemaker, AS s/p TAVR,insulin-dependent diabetes mellitus, CKD stage IIIa,  and 2 episodes of MSSA bacteremia earlier this year attributed to right foot infection and managed with second toe amputation and 6 weeks IV cefazolin, now with his third episode of MSSA bacteremia and transferred from [facility] for pacemaker extraction.    [facility] Hospital Course: Patient was seen in the [facility] ED on 10/30/2021 with fevers, had blood cultures collected, and went home with Keflex.  He continued to have fever to 103 F at home that night, the cultures started growing gram-positive cocci, and he came back into the [facility] ED the following day where he had fluid responsive hypotension and-year-old was admitted (11/18 [facility]).   ID provided consultation for the recurrent bacteremia which ended up being MSSA again, patient underwent TEE on 11/03/2021 with no evidence for endocarditis.  He was continued on IV cefazolin, transferred to [facility] for extraction of pacemaker per ID recommendation, and will need long-term suppressive therapy in light of his prosthetic AV.   He appears to have chronic intermittent thrombocytopenia, platelet count 70 on admission, then decreased to the 40 range where it remained stable for the 4 days leading up to transfer.  Plavix and pharmacologic VTE prophylaxis were held for this reason and he was continued on baby aspirin.   Troponin was elevated to the 200s on admission, he was evaluated by cardiology, had preserved EF on echo, and this was attributed to demand ischemia in the setting of sepsis with hypotension and renal insufficiency.   Transaminases were elevated during the admission with normal alkaline phosphatase and normal bilirubin.  Statin was held and transaminases have been  trending down.   He developed loose stools without abdominal pain or leukocytosis. This was felt to be related to antibiotics, laxatives were held, and probiotic started.    Subjective: 11/26 afebrile overnight, A/O x4.  Some bleeding over temporary pacer site (pressure bandage applied)   Assessment & Plan:  Covid vaccination; vaccinated 3/3  Principal Problem:   MSSA bacteremia Active Problems:   Coronary artery disease involving coronary bypass graft of native heart with angina pectoris (HCC)   S/P TAVR (transcatheter aortic valve replacement)   Thrombocytopenia (HCC)   Anemia of chronic disease   CKD stage G3a/A3, GFR 45-59 and albumin creatinine ratio >300 mg/g (HCC)   Type 2 diabetes mellitus with diabetic chronic kidney disease (HCC)   CAD (coronary artery disease)   Hypokalemia   Elevated transaminase level   Sick sinus syndrome ([place])   Diabetes mellitus type 2, controlled, with complications ([place])   Diabetic nephropathy ([place])   Acute urinary retention  MSSA bacteremia  - Admitted to [facility] on 10/31/21 with 3rd episode of MSSA bacteremia in a yr, seen by ID, started on cefazolin, had no evidence for endocarditis on TEE 11/03/21, and was transferred to [facility] for pacemaker extraction  - Blood cultures repeated 10/14/2021  - Cardiology coordinating pacemaker extraction, continue cefazolin, will need long-term suppressive antibiotics per ID at [facility]   -11/23 spoke with card master patient is on their list to be seen today.  Explained to patient and wife will be up to cardiology for exact timing when they remove pacer given his bacteremia, and how long he will have to remain on antibiotics prior to them reinserting  new pacer. - 11/24 per ID note from last night on 11/23 In the setting of retained TAVR will need suppressive antibiotics after completion of at least 6 weeks of IV cefazolin -11/25 s/p pacer removal with implantation of temporary pacer left chest wall   Thrombocytopenia -  Platelets 70 on admission, stable in 40-range for the 4 days leading up to transfer to [facility]  - Hx of intermittent thrombocytopenia going back at least a yr   -11/26 patient has bleeding or surgical site left chest wall. - 11/26 transfuse 1 unit platelets - Continue to hold Plavix and pharmacologic VTE ppx, continue daily CBC    Latest Reference Range & Units 11/06/21 01:22 10/27/2021 02:13 11/08/21 04:31 11/08/21 14:59  Platelets 150 - 400 K/uL 45 (L) 56 (L) 66 (L) 65 (L)  (L): Data is abnormally low   Elevated transaminase -Resolving - Continue to hold statin   Sick sinus syndrome, s/p pacemaker -Plan extraction of pacemaker by cardiology - Per cardiology hemoglobin goal> 9 - Strict in and out -333.39ml - Daily weight Filed Weights   11/06/21 0341 11/09/2021 0348 11/08/21 0428  Weight: 107.6 kg 112 kg 112.7 kg     CAD - No anginal complaints  - Troponin was elevated to 200s on admission to [facility] on 11/18, seen by cardiology there, had preserved EF on echo, and was attributed to demand ishcemia in setting of bacteremia with hypotension, renal insufficiency, and anemia   AS s/p TAVR  - No evidence for endocarditis on TEE 11/03/21  -See MSSA bacteremia   DM type II controlled with complication/DM Nephropathy  - A1c was 6.9% in November 2022  -Semglee 11 units daily - 11/25 increase resistant SSI -11/26 NovoLog 10 units QAC  Acute on CKD Stage IIIa (baseline Cr~1.59) - Monitor renal function closely Lab Results  Component Value Date   CREATININE 2.73 (H) 11/08/2021   CREATININE 3.10 (H) 10/19/2021   CREATININE 2.39 (H) 11/06/2021   CREATININE 1.75 (H) 11/05/2021   CREATININE 1.59 (H) 11/03/2021  -11/25 Foley placed yesterday secondary to urinary retention, sodium bicarb started today.  If renal function continues to deteriorate over the next several days will need to consult nephrology.  Expect that it should turn around. -11/26 improving on bicarb drip.  Acute urinary  Retention - Place Foley catheter for comfort. - 11/24 increase Flomax 0.4 mg BID  Hypokalemia -Potassium goal> 4  Hypomagnesmia - Magnesium goal> 2   Hypophosphatemia - Phosphorus goal> 2.5 - Sodium phosphate IV 40 mmol  Hypocalcemia - 11/24 corrected calcium= 9.0  Hyponatremia - 11/25 with patient's increasing acute renal failure, and decreasing HCO3 see normal saline -11/25 bicarb drip 72ml/hr     DVT prophylaxis: SCD Code Status: Full Family Communication: 11/25 wife at bedside for discussion of plan of care all questions answered Status is: Inpatient    Dispo: The patient is from: Home              Anticipated d/c is to: Home              Anticipated d/c date is: 3 days              Patient currently is not medically stable to d/c.      Consultants:  Cardiology   Procedures/Significant Events:  Echo 10/31/21  1. Left ventricular ejection fraction, by estimation, is 60 to 65%. The  left ventricle has normal function. The left ventricle has no regional  wall motion abnormalities. There is mild left  ventricular hypertrophy.  Left ventricular diastolic parameters  are consistent with Grade I diastolic dysfunction (impaired relaxation).   2. Right ventricular systolic function is normal. The right ventricular  size is normal. Tricuspid regurgitation signal is inadequate for assessing  PA pressure.   11/21 TEE   Left Ventricle: Left ventricular ejection fraction, by estimation, is 55  to 60%. The left ventricle has normal function. The left ventricular  internal cavity size was normal in size.   Right Ventricle: The right ventricular size is normal. No increase in  right ventricular wall thickness. Right ventricular systolic function is  normal.  Aortic Valve: The aortic valve has been repaired/replaced. Aortic valve  regurgitation is not visualized. There is a Sapien prosthetic, stented  (TAVR) valve present in the aortic position. Echo findings are  consistent  with normal structure and function of   the aortic valve prosthesis.  11/25 s/p pacer removal with implantation of temporary pacer left chest wall 11/26 transfuse 1 unit platelets   I have personally reviewed and interpreted all radiology studies and my findings are as above.  VENTILATOR SETTINGS: 2 L O2 nasal cannula 11/25 SPO2 100%   Cultures 11/17 blood LEFT wrist positive MSSA 11/17 blood LEFT AC positive MSSA 11/18 urine insignificant growth 11/18 blood RIGHT wrist positive MSSA 11/20 blood LEFT wrist positive MSSA  11/22 blood LEFT hand NGTD 11/22 blood LEFT hand NGTD    Antimicrobials: Anti-infectives (From admission, onward)    Start     Ordered Stop   11/09/2021 2200  ceFAZolin (ANCEF) IVPB 2g/100 mL premix        11/03/2021 2035           Devices    LINES / TUBES:      Continuous Infusions:  sodium chloride 10 mL/hr at 11/08/21 0045    ceFAZolin (ANCEF) IV 1 g (11/08/21 0026)   sodium bicarbonate 150 mEq in D5W infusion Stopped (11/08/21 0044)   sodium phosphate  Dextrose 5% IVPB       Objective: Vitals:   10/30/2021 2046 11/03/2021 2338 11/08/21 0428 11/08/21 0744  BP:  (!) 144/58 135/64 124/61  Pulse:  80 80 80  Resp:  20 20 20   Temp:  (!) 97.5 F (36.4 C) 97.9 F (36.6 C) 97.6 F (36.4 C)  TempSrc:  Oral Oral Oral  SpO2: 100% 100% 100% 100%  Weight:   112.7 kg   Height:        Intake/Output Summary (Last 24 hours) at 11/08/2021 0754 Last data filed at 11/08/2021 0430 Gross per 24 hour  Intake 1954.06 ml  Output 2110 ml  Net -155.94 ml    Filed Weights   11/06/21 0341 10/24/2021 0348 11/08/21 0428  Weight: 107.6 kg 112 kg 112.7 kg    Examination:  General: A/O x4, No acute respiratory distress Eyes: negative scleral hemorrhage, negative anisocoria, negative icterus ENT: Negative Runny nose, negative gingival bleeding, Neck:  Negative scars, masses, torticollis, lymphadenopathy, JVD Lungs: Clear to auscultation  bilaterally without wheezes or crackles Cardiovascular: Regular rate and rhythm without murmur gallop or rub normal S1 and S2 left upper chest wall temporary pacer in place negative sign of bleeding.  Patient's left arm in sling, comfortable Abdomen: negative abdominal pain, nondistended, positive soft, bowel sounds, no rebound, no ascites, no appreciable mass Extremities: No significant cyanosis, clubbing, or edema bilateral lower extremities Skin: Negative rashes, lesions, ulcers Psychiatric:  Negative depression, negative anxiety, negative fatigue, negative mania  Central nervous system:  Cranial nerves II through  XII intact, tongue/uvula midline, all extremities muscle strength 5/5, sensation intact throughout, negative dysarthria, negative expressive aphasia, negative receptive aphasia.  .     Data Reviewed: Care during the described time interval was provided by me .  I have reviewed this patient's available data, including medical history, events of note, physical examination, and all test results as part of my evaluation.   CBC: Recent Labs  Lab 11/06/2021 1121 11/05/21 0142 11/06/21 0122 10/30/2021 0213 11/08/21 0431  WBC 4.0 3.6* 5.3 9.8 11.0*  NEUTROABS 3.2 2.6 4.2 8.7* 10.0*  HGB 8.3* 7.9* 7.5* 8.9* 9.0*  HCT 23.9* 22.9* 21.5* 26.5* 25.8*  MCV 91.2 90.9 90.3 90.4 89.0  PLT 42* 40* 45* 56* 66*    Basic Metabolic Panel: Recent Labs  Lab 11/03/21 0536 11/05/21 0142 11/06/21 0122 10/30/2021 0213 11/08/21 0431  NA 135 132* 129* 129* 130*  K 3.3* 3.0* 3.9 4.1 4.2  CL 104 104 103 102 103  CO2 22 18* 17* 17* 17*  GLUCOSE 174* 153* 201* 245* 222*  BUN 44* 50* 62* 81* 84*  CREATININE 1.59* 1.75* 2.39* 3.10* 2.73*  CALCIUM 7.8* 7.5* 7.4* 7.3* 7.5*  MG  --  1.7 2.3 2.1 2.4  PHOS  --   --  1.9* 2.9 3.4    GFR: Estimated Creatinine Clearance: 30.2 mL/min (A) (by C-G formula based on SCr of 2.73 mg/dL (H)). Liver Function Tests: Recent Labs  Lab 11/02/21 0553  11/05/21 0142 11/06/21 0122 11/03/2021 0213 11/08/21 0431  AST 191* 75* 69* 84* 56*  ALT 75* 13 9 7  <5  ALKPHOS 65 83 88 90 98  BILITOT 0.3 0.6 0.5 0.6 0.7  PROT 5.7* 4.6* 4.6* 4.6* 4.7*  ALBUMIN 2.7* 2.0* 2.0* 1.9* 2.0*    No results for input(s): LIPASE, AMYLASE in the last 168 hours. No results for input(s): AMMONIA in the last 168 hours. Coagulation Profile: Recent Labs  Lab 10/24/2021 1121  INR 1.6*    Cardiac Enzymes: No results for input(s): CKTOTAL, CKMB, CKMBINDEX, TROPONINI in the last 168 hours. BNP (last 3 results) No results for input(s): PROBNP in the last 8760 hours. HbA1C: No results for input(s): HGBA1C in the last 72 hours. CBG: Recent Labs  Lab 10/24/2021 1600  2121 10/29/2021 2207 10/30/2021 2342 11/08/21 0433  GLUCAP 305* 370* 381* 379* 224*    Lipid Profile: No results for input(s): CHOL, HDL, LDLCALC, TRIG, CHOLHDL, LDLDIRECT in the last 72 hours. Thyroid Function Tests: No results for input(s): TSH, T4TOTAL, FREET4, T3FREE, THYROIDAB in the last 72 hours. Anemia Panel: No results for input(s): VITAMINB12, FOLATE, FERRITIN, TIBC, IRON, RETICCTPCT in the last 72 hours.  Urine analysis:    Component Value Date/Time   COLORURINE YELLOW (A) 10/31/2021 1707   APPEARANCEUR HAZY (A) 10/31/2021 1707   LABSPEC 1.017 10/31/2021 1707   PHURINE 5.0 10/31/2021 1707   GLUCOSEU 50 (A) 10/31/2021 1707   HGBUR SMALL (A) 10/31/2021 1707   BILIRUBINUR NEGATIVE 10/31/2021 1707   KETONESUR NEGATIVE 10/31/2021 1707   PROTEINUR 100 (A) 10/31/2021 1707   NITRITE NEGATIVE 10/31/2021 1707   LEUKOCYTESUR NEGATIVE 10/31/2021 1707   Sepsis Labs: @LABRCNTIP (procalcitonin:4,lacticidven:4)  ) Recent Results (from the past 240 hour(s))  Culture, blood (Routine x 2)     Status: Abnormal   Collection Time: 10/30/21  9:51 AM   Specimen: BLOOD  Result Value Ref Range Status   Specimen Description   Final    BLOOD LEFT ANTECUBITAL Performed at [facility], [address]  [place], [place] [address]    Special Requests   Final    BOTTLES DRAWN AEROBIC AND ANAEROBIC Blood Culture adequate volume Performed at [facility], [address] [place], [place] [address]    Culture  Setup Time   Final    GRAM POSITIVE COCCI IN BOTH AEROBIC AND ANAEROBIC BOTTLES CRITICAL VALUE NOTED.  VALUE IS CONSISTENT WITH PREVIOUSLY REPORTED AND CALLED VALUE. Performed at [facility], [address] [place], [place] [address]    Culture (A)  Final    STAPHYLOCOCCUS AUREUS SUSCEPTIBILITIES PERFORMED ON PREVIOUS CULTURE WITHIN THE LAST 5 DAYS. Performed at [facility], [place] [address] [place], [place] [address]    Report Status 11/02/2021 FINAL  Final  Culture, blood (Routine x 2)     Status: Abnormal   Collection Time: 10/30/21 10:02 AM   Specimen: BLOOD  Result Value Ref Range Status   Specimen Description   Final    BLOOD LEFT WRIST Performed at [facility], [address] [place], [place] [address]    Special Requests   Final    BOTTLES DRAWN AEROBIC AND ANAEROBIC Blood Culture results may not be optimal due to an excessive volume of blood received in culture bottles Performed at [facility], [address], [place], [place] [address]    Culture  Setup Time   Final    GRAM POSITIVE COCCI IN BOTH AEROBIC AND ANAEROBIC BOTTLES CRITICAL RESULT CALLED TO, READ BACK BY AND VERIFIED WITH: Leonides Schanz RN [id number] 10/30/21 HNM Performed at [facility], [place] [address], [place], [place] [address]    Culture STAPHYLOCOCCUS AUREUS (A)  Final   Report Status 11/02/2021 FINAL  Final   Organism ID, Bacteria STAPHYLOCOCCUS AUREUS  Final      Susceptibility   Staphylococcus aureus - MIC*    CIPROFLOXACIN <=0.5 SENSITIVE Sensitive     ERYTHROMYCIN >=8 RESISTANT Resistant     GENTAMICIN <=0.5 SENSITIVE Sensitive     OXACILLIN 0.5 SENSITIVE Sensitive     TETRACYCLINE <=1 SENSITIVE Sensitive      VANCOMYCIN <=0.5 SENSITIVE Sensitive     TRIMETH/SULFA <=10 SENSITIVE Sensitive     CLINDAMYCIN RESISTANT Resistant     RIFAMPIN <=0.5 SENSITIVE Sensitive     Inducible Clindamycin POSITIVE Resistant     * STAPHYLOCOCCUS AUREUS  Blood Culture ID Panel (Reflexed)     Status: Abnormal   Collection Time: 10/30/21 10:02 AM  Result Value Ref Range Status   Enterococcus faecalis NOT DETECTED NOT DETECTED Final   Enterococcus Faecium NOT DETECTED NOT DETECTED Final   Listeria monocytogenes NOT DETECTED NOT DETECTED Final   Staphylococcus species DETECTED (A) NOT DETECTED Final    Comment: CRITICAL RESULT CALLED TO, READ BACK BY AND VERIFIED WITH: Leonides Schanz RN 2239 10/30/21 HNM    Staphylococcus aureus (BCID) DETECTED (A) NOT DETECTED Final    Comment: CRITICAL RESULT CALLED TO, READ BACK BY AND VERIFIED WITH: Leonides Schanz RN 2239 10/30/21 HNM    Staphylococcus epidermidis NOT DETECTED NOT DETECTED Final   Staphylococcus lugdunensis NOT DETECTED NOT DETECTED Final   Streptococcus species NOT DETECTED NOT DETECTED Final   Streptococcus agalactiae NOT DETECTED NOT DETECTED Final   Streptococcus pneumoniae NOT DETECTED NOT DETECTED Final   Streptococcus pyogenes NOT DETECTED NOT DETECTED Final   A.calcoaceticus-baumannii NOT DETECTED NOT DETECTED Final   Bacteroides fragilis NOT DETECTED NOT DETECTED Final   Enterobacterales NOT DETECTED NOT DETECTED Final   Enterobacter cloacae complex NOT DETECTED  NOT DETECTED Final   Escherichia coli NOT DETECTED NOT DETECTED Final   Klebsiella aerogenes NOT DETECTED NOT DETECTED Final   Klebsiella oxytoca NOT DETECTED NOT DETECTED Final   Klebsiella pneumoniae NOT DETECTED NOT DETECTED Final   Proteus species NOT DETECTED NOT DETECTED Final   Salmonella species NOT DETECTED NOT DETECTED Final   Serratia marcescens NOT DETECTED NOT DETECTED Final   Haemophilus influenzae NOT DETECTED NOT DETECTED Final   Neisseria meningitidis NOT DETECTED NOT  DETECTED Final   Pseudomonas aeruginosa NOT DETECTED NOT DETECTED Final   Stenotrophomonas maltophilia NOT DETECTED NOT DETECTED Final   Candida albicans NOT DETECTED NOT DETECTED Final   Candida auris NOT DETECTED NOT DETECTED Final   Candida glabrata NOT DETECTED NOT DETECTED Final   Candida krusei NOT DETECTED NOT DETECTED Final   Candida parapsilosis NOT DETECTED NOT DETECTED Final   Candida tropicalis NOT DETECTED NOT DETECTED Final   Cryptococcus neoformans/gattii NOT DETECTED NOT DETECTED Final   Meth resistant mecA/C and MREJ NOT DETECTED NOT DETECTED Final    Comment: Performed at [facility], [address] [place], [place] [address]  Resp Panel by RT-PCR (Flu A&B, Covid) Nasopharyngeal Swab     Status: None   Collection Time: 10/30/21 12:14 PM   Specimen: Nasopharyngeal Swab; Nasopharyngeal(NP) swabs in vial transport medium  Result Value Ref Range Status   SARS Coronavirus 2 by RT PCR NEGATIVE NEGATIVE Final    Comment: (NOTE) SARS-CoV-2 target nucleic acids are NOT DETECTED.  The SARS-CoV-2 RNA is generally detectable in upper respiratory specimens during the acute phase of infection. The lowest concentration of SARS-CoV-2 viral copies this assay can detect is 138 copies/mL. A negative result does not preclude SARS-Cov-2 infection and should not be used as the sole basis for treatment or other patient management decisions. A negative result may occur with  improper specimen collection/handling, submission of specimen other than nasopharyngeal swab, presence of viral mutation(s) within the areas targeted by this assay, and inadequate number of viral copies(<138 copies/mL). A negative result must be combined with clinical observations, patient history, and epidemiological information. The expected result is Negative.  Fact Sheet for Patients:  EntrepreneurPulse.com.au  Fact Sheet for Healthcare Providers:   IncredibleEmployment.be  This test is no t yet approved or cleared by the [place] FDA and  has been authorized for detection and/or diagnosis of SARS-CoV-2 by FDA under an Emergency Use Authorization (EUA). This EUA will remain  in effect (meaning this test can be used) for the duration of the COVID-19 declaration under Section 564(b)(1) of the Act, 21 U.S.C.section 360bbb-3(b)(1), unless the authorization is terminated  or revoked sooner.       Influenza A by PCR NEGATIVE NEGATIVE Final   Influenza B by PCR NEGATIVE NEGATIVE Final    Comment: (NOTE) The Xpert Xpress SARS-CoV-2/FLU/RSV plus assay is intended as an aid in the diagnosis of influenza from Nasopharyngeal swab specimens and should not be used as a sole basis for treatment. Nasal washings and aspirates are unacceptable for Xpert Xpress SARS-CoV-2/FLU/RSV testing.  Fact Sheet for Patients: EntrepreneurPulse.com.au  Fact Sheet for Healthcare Providers: IncredibleEmployment.be  This test is not yet approved or cleared by the [place] FDA and has been authorized for detection and/or diagnosis of SARS-CoV-2 by FDA under an Emergency Use Authorization (EUA). This EUA will remain in effect (meaning this test can be used) for the duration of the COVID-19 declaration under Section 564(b)(1) of the Act, 21 U.S.C. section 360bbb-3(b)(1), unless the authorization  is terminated or revoked.  Performed at [facility], [address], [place], [place] [address]   Urine Culture     Status: Abnormal   Collection Time: 10/31/21 10:00 AM   Specimen: Urine, Random  Result Value Ref Range Status   Specimen Description   Final    URINE, RANDOM Performed at [facility], [address], [place], [place] [address]    Special Requests   Final    NONE Performed at [facility], [address] [place], [place] [address]    Culture (A)   Final    <10,000 COLONIES/mL INSIGNIFICANT GROWTH Performed at [facility], [place] [address] [place], [place] [address]    Report Status 11/02/2021 FINAL  Final  Resp Panel by RT-PCR (Flu A&B, Covid) Nasopharyngeal Swab     Status: None   Collection Time: 10/31/21 10:11 AM   Specimen: Nasopharyngeal Swab; Nasopharyngeal(NP) swabs in vial transport medium  Result Value Ref Range Status   SARS Coronavirus 2 by RT PCR NEGATIVE NEGATIVE Final    Comment: (NOTE) SARS-CoV-2 target nucleic acids are NOT DETECTED.  The SARS-CoV-2 RNA is generally detectable in upper respiratory specimens during the acute phase of infection. The lowest concentration of SARS-CoV-2 viral copies this assay can detect is 138 copies/mL. A negative result does not preclude SARS-Cov-2 infection and should not be used as the sole basis for treatment or other patient management decisions. A negative result may occur with  improper specimen collection/handling, submission of specimen other than nasopharyngeal swab, presence of viral mutation(s) within the areas targeted by this assay, and inadequate number of viral copies(<138 copies/mL). A negative result must be combined with clinical observations, patient history, and epidemiological information. The expected result is Negative.  Fact Sheet for Patients:  EntrepreneurPulse.com.au  Fact Sheet for Healthcare Providers:  IncredibleEmployment.be  This test is no t yet approved or cleared by the [place] FDA and  has been authorized for detection and/or diagnosis of SARS-CoV-2 by FDA under an Emergency Use Authorization (EUA). This EUA will remain  in effect (meaning this test can be used) for the duration of the COVID-19 declaration under Section 564(b)(1) of the Act, 21 U.S.C.section 360bbb-3(b)(1), unless the authorization is terminated  or revoked sooner.       Influenza A by PCR NEGATIVE NEGATIVE Final    Influenza B by PCR NEGATIVE NEGATIVE Final    Comment: (NOTE) The Xpert Xpress SARS-CoV-2/FLU/RSV plus assay is intended as an aid in the diagnosis of influenza from Nasopharyngeal swab specimens and should not be used as a sole basis for treatment. Nasal washings and aspirates are unacceptable for Xpert Xpress SARS-CoV-2/FLU/RSV testing.  Fact Sheet for Patients: EntrepreneurPulse.com.au  Fact Sheet for Healthcare Providers: IncredibleEmployment.be  This test is not yet approved or cleared by the [place] FDA and has been authorized for detection and/or diagnosis of SARS-CoV-2 by FDA under an Emergency Use Authorization (EUA). This EUA will remain in effect (meaning this test can be used) for the duration of the COVID-19 declaration under Section 564(b)(1) of the Act, 21 U.S.C. section 360bbb-3(b)(1), unless the authorization is terminated or revoked.  Performed at [facility], [address] [place], [place] [address]   Blood Culture (routine x 2)     Status: Abnormal   Collection Time: 10/31/21 10:13 AM   Specimen: BLOOD  Result Value Ref Range Status   Specimen Description   Final    BLOOD RIGHT WRIST Performed at [facility], [address], [place], [place] [address]    Special Requests   Final    BOTTLES DRAWN AEROBIC AND ANAEROBIC Blood Culture adequate volume Performed at [facility], [address] [place], [place] [address]    Culture  Setup Time   Final    GRAM POSITIVE COCCI IN BOTH AEROBIC AND ANAEROBIC BOTTLES CRITICAL RESULT CALLED TO, READ BACK BY AND VERIFIED WITH: SHEEMA HALLAGI AT 1900 ON 10/31/21 BY SS    Culture (A)  Final    STAPHYLOCOCCUS AUREUS SUSCEPTIBILITIES PERFORMED ON PREVIOUS CULTURE WITHIN THE LAST 5 DAYS. Performed at [facility], [place] [address], [place], [place] [address]    Report Status 11/02/2021 FINAL  Final  Blood Culture (routine x 2)     Status:  Abnormal   Collection Time: 10/31/21 10:13 AM   Specimen: BLOOD  Result Value Ref Range Status   Specimen Description   Final    BLOOD LEFT WRIST Performed at [facility], [address], [place], [place] [address]    Special Requests   Final    BOTTLES DRAWN AEROBIC AND ANAEROBIC BCAV Performed at [facility], [address], [place], [place] [address]    Culture  Setup Time   Final    GRAM POSITIVE COCCI IN BOTH AEROBIC AND ANAEROBIC BOTTLES CRITICAL VALUE NOTED.  VALUE IS CONSISTENT WITH PREVIOUSLY REPORTED AND CALLED VALUE. Performed at [facility], [address] [place], [place] [address]    Culture (A)  Final    STAPHYLOCOCCUS AUREUS SUSCEPTIBILITIES PERFORMED ON PREVIOUS CULTURE WITHIN THE LAST 5 DAYS. Performed at [facility], [place] [address], [place], [place] [address]    Report Status 11/02/2021 FINAL  Final  Blood Culture ID Panel (Reflexed)     Status: Abnormal   Collection Time: 10/31/21 10:13 AM  Result Value Ref Range Status   Enterococcus faecalis NOT DETECTED NOT DETECTED Final   Enterococcus Faecium NOT DETECTED NOT DETECTED Final   Listeria monocytogenes NOT DETECTED NOT DETECTED Final   Staphylococcus species DETECTED (A) NOT DETECTED Final    Comment: CRITICAL RESULT CALLED TO, READ BACK BY AND VERIFIED WITH: SHEEMA HALLAGI AT 1900 ON 10/31/21 BY SS    Staphylococcus aureus (BCID) DETECTED (A) NOT DETECTED Final    Comment: CRITICAL RESULT CALLED TO, READ BACK BY AND VERIFIED WITH: SHEEMA HALLAGI AT 1900 ON 10/31/21 BY SS    Staphylococcus epidermidis NOT DETECTED NOT DETECTED Final   Staphylococcus lugdunensis NOT DETECTED NOT DETECTED Final   Streptococcus species NOT DETECTED NOT DETECTED Final   Streptococcus agalactiae NOT DETECTED NOT DETECTED Final   Streptococcus pneumoniae NOT DETECTED NOT DETECTED Final   Streptococcus pyogenes NOT DETECTED NOT DETECTED Final   A.calcoaceticus-baumannii NOT DETECTED  NOT DETECTED Final   Bacteroides fragilis NOT DETECTED NOT DETECTED Final   Enterobacterales NOT DETECTED NOT DETECTED Final   Enterobacter cloacae complex NOT DETECTED NOT DETECTED Final   Escherichia coli NOT DETECTED NOT DETECTED Final   Klebsiella aerogenes NOT DETECTED NOT DETECTED Final   Klebsiella oxytoca NOT DETECTED NOT DETECTED Final   Klebsiella pneumoniae NOT DETECTED NOT DETECTED Final   Proteus species NOT DETECTED NOT DETECTED Final   Salmonella species NOT DETECTED NOT DETECTED Final   Serratia marcescens NOT DETECTED NOT DETECTED Final   Haemophilus influenzae NOT DETECTED NOT DETECTED Final   Neisseria meningitidis NOT DETECTED NOT DETECTED Final   Pseudomonas aeruginosa NOT DETECTED NOT DETECTED Final   Stenotrophomonas maltophilia  NOT DETECTED NOT DETECTED Final   Candida albicans NOT DETECTED NOT DETECTED Final   Candida auris NOT DETECTED NOT DETECTED Final   Candida glabrata NOT DETECTED NOT DETECTED Final   Candida krusei NOT DETECTED NOT DETECTED Final   Candida parapsilosis NOT DETECTED NOT DETECTED Final   Candida tropicalis NOT DETECTED NOT DETECTED Final   Cryptococcus neoformans/gattii NOT DETECTED NOT DETECTED Final   Meth resistant mecA/C and MREJ NOT DETECTED NOT DETECTED Final    Comment: Performed at [facility], [address] [place], [place] [address]  CULTURE, BLOOD (ROUTINE X 2) w Reflex to ID Panel     Status: None (Preliminary result)   Collection Time: 10/15/2021  3:42 AM   Specimen: BLOOD  Result Value Ref Range Status   Specimen Description BLOOD LEFT HAND  Final   Special Requests   Final    BOTTLES DRAWN AEROBIC AND ANAEROBIC Blood Culture results may not be optimal due to an excessive volume of blood received in culture bottles   Culture   Final    NO GROWTH 4 DAYS Performed at [facility], [address] [place], [place] [address]    Report Status PENDING  Incomplete  CULTURE, BLOOD (ROUTINE X 2) w Reflex to  ID Panel     Status: None (Preliminary result)   Collection Time: 11/01/2021  3:48 AM   Specimen: BLOOD  Result Value Ref Range Status   Specimen Description BLOOD LEFT HAND  Final   Special Requests   Final    BOTTLES DRAWN AEROBIC AND ANAEROBIC Blood Culture results may not be optimal due to an excessive volume of blood received in culture bottles   Culture   Final    NO GROWTH 4 DAYS Performed at [facility], [address], [place], [place] [address]    Report Status PENDING  Incomplete  Surgical pcr screen     Status: Abnormal   Collection Time: 11/06/21 10:16 PM   Specimen: Nasal Mucosa; Nasal Swab  Result Value Ref Range Status   MRSA, PCR NEGATIVE NEGATIVE Final   Staphylococcus aureus POSITIVE (A) NEGATIVE Final    Comment: (NOTE) The Xpert SA Assay (FDA approved for NASAL specimens in patients 41 years of age and older), is one component of a comprehensive surveillance program. It is not intended to diagnose infection nor to guide or monitor treatment. Performed at [facility], [place] [address], [place],  [address]          Radiology Studies: DG C-Arm 1-60 Min-No Report  Result Date: 10/31/2021 Fluoroscopy was utilized by the requesting physician.  No radiographic interpretation.        Scheduled Meds:  sodium chloride   Intravenous Once   sodium chloride   Intravenous Once   allopurinol  100 mg Oral Daily   Chlorhexidine Gluconate Cloth  6 each Topical Daily   gabapentin  100 mg Oral Daily   insulin aspart  0-20 Units Subcutaneous Q4H   insulin glargine-yfgn  11 Units Subcutaneous QHS   mometasone-formoterol  2 puff Inhalation BID   mupirocin ointment  1 application Nasal BID   pantoprazole  40 mg Oral BID   rosuvastatin  40 mg Oral QHS   saccharomyces boulardii  250 mg Oral BID   sertraline  200 mg Oral Daily   sodium chloride flush  3 mL Intravenous Q12H   sucralfate  1 g Oral QID   tamsulosin  0.4 mg Oral BID   Continuous  Infusions:  sodium chloride 10 mL/hr at 11/08/21 0045    ceFAZolin (ANCEF) IV 1 g (11/08/21 0026)   sodium bicarbonate 150 mEq in D5W infusion Stopped (11/08/21 0044)   sodium phosphate  Dextrose 5% IVPB       LOS: 4 days   The patient is critically ill with multiple organ systems failure and requires high complexity decision making for assessment and support, frequent evaluation and titration of therapies, application of advanced monitoring technologies and extensive interpretation of multiple databases. Critical Care Time devoted to patient care services described in this note  Time spent: 40 minutes     Khaleef Ruby, Geraldo Docker, MD [facility]   If 7PM-7AM, please contact night-coverage 11/08/2021, 7:54 AM

## 2021-11-08 NOTE — TOC Initial Note (Addendum)
Transition of Care [facility]) - Initial/Assessment Note    Patient Details  Name: Nathan Singleton MRN: [id number] Date of Birth: 10-Aug-1941  Transition of Care [name]) CM/SW Contact:    Verdell Carmine, RN Phone Number: 11/08/2021, 3:54 PM  Clinical Narrative:                 80 yo transferred from [facility] due to pacemaker excision. Pacemaker was taken out. Patient will need home IV abx for continued bacteremia. [facility] from [facility] aware of patient.  [facility] believe that Nathan Singleton was the provider, they will double check that and be ready to assist when needed.  Expected to DC home with Home antibiotics and Home Health CM will follow for needs, recommendations, and transitions   Expected Discharge Plan: [place] Barriers to Discharge: Continued Medical Work up   Patient Goals and CMS Choice        Expected Discharge Plan and Services Expected Discharge Plan: [place] Acute Care Choice: Home Health (RN for IV antibiotics) Living arrangements for the past 2 months: Single Family Home                                      Prior Living Arrangements/Services Living arrangements for the past 2 months: Single Family Home Lives with:: Significant Other Patient language and need for interpreter reviewed:: Yes        Need for Family Participation in Patient Care: Yes (Comment) Care giver support system in place?: Yes (comment) Current home services: Home RN Criminal Activity/Legal Involvement Pertinent to Current Situation/Hospitalization: No - Comment as needed  Activities of Daily Living Home Assistive Devices/Equipment: Cane (specify quad or straight) ADL Screening (condition at time of admission) Patient's cognitive ability adequate to safely complete daily activities?: No Is the patient deaf or have difficulty hearing?: Yes Does the patient have difficulty seeing, even when wearing glasses/contacts?: No Does  the patient have difficulty concentrating, remembering, or making decisions?: No Patient able to express need for assistance with ADLs?: Yes Does the patient have difficulty dressing or bathing?: Yes Independently performs ADLs?: No Does the patient have difficulty walking or climbing stairs?: Yes Weakness of Legs: Both Weakness of Arms/Hands: Both  Permission Sought/Granted                  Emotional Assessment       Orientation: : Oriented to Self, Oriented to Place, Oriented to  Time, Oriented to Situation Alcohol / Substance Use: Not Applicable Psych Involvement: No (comment)  Admission diagnosis:  MSSA bacteremia [R78.81, B95.61] Patient Active Problem List   Diagnosis Date Noted   Sick sinus syndrome ([place]) 11/06/2021   Diabetes mellitus type 2, controlled, with complications ([place]) 41/96/2229   Diabetic nephropathy ([place]) 11/06/2021   Acute urinary retention 11/06/2021   Antibiotic-associated diarrhea 11/02/2021   Hypokalemia 10/14/2021   Elevated transaminase level 11/09/2021   Sepsis ([place]) 10/31/2021   Elevated troponin    NSTEMI (non-ST elevated myocardial infarction) ([place]) 10/17/2021   Pancytopenia ([place]) 10/17/2021   CAD (coronary artery disease) 10/17/2021   Acute renal failure superimposed on stage 3a chronic kidney disease ([place]) 10/17/2021   Osteoarthritis of shoulder (Left) 09/15/2021   Osteoarthritis of AC (acromioclavicular) joint (Left) 09/15/2021   Osteoarthritis of glenohumeral joint (Left) 09/15/2021   Limited internal rotation of glenohumeral joint of shoulder (Left) 09/15/2021  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 ([place]) 09/05/3006   Arthritis 08/19/2021   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   History of GI bleed 08/19/2021   MSSA bacteremia 08/19/2021   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[place]) 08/19/2021   Orthostatic hypotension 08/19/2021   Weakness 08/19/2021   HTN (hypertension) 06/27/2021    AKI (acute kidney injury) ([place]) 06/27/2021   AMS (altered mental status) 06/27/2021   CKD stage G3a/A3, GFR 45-59 and albumin creatinine ratio >300 mg/g (HCC) 06/27/2021   Peripheral vascular disease ([employer]) 06/25/2021   Edema, unspecified 06/25/2021   Proteinuria, unspecified 06/25/2021   Type 2 diabetes mellitus with diabetic chronic kidney disease ([place]) 06/25/2021   Anemia in chronic kidney disease 06/25/2021   Bacteremia    Osteomyelitis ([place]) 06/01/2021   Syncope and collapse 05/25/2021   Spondylosis without myelopathy or radiculopathy, lumbosacral region 05/12/2021   CAP (community acquired pneumonia) 05/06/2021   Syncope, vasovagal 05/06/2021   CKD (chronic kidney disease), stage IIIa 05/06/2021   Diarrhea 05/06/2021   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[place]) 05/06/2021   Osteoarthritis of facet joint at L5-S1 level of lumbosacral spine 04/22/2021   Lumbar facet joint syndrome (Bilateral) (L>R) 04/22/2021   Anemia of chronic disease    Cellulitis of second toe of right foot 03/20/2021   Type 2 diabetes mellitus with hyperlipidemia ([place]) 03/20/2021   Chronic kidney disease, stage 3a ([place]) 03/20/2021   Dizziness 03/20/2021   Chronic pain syndrome 03/03/2021   Pharmacologic therapy 03/03/2021   Disorder of skeletal system 03/03/2021   Problems influencing health status 62/26/3335   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[place]) 03/03/2021   Chronic lower extremity pain (2ry area of Pain) (Bilateral) (R>L) 03/03/2021   Chronic anticoagulation (Plavix) 03/03/2021   Chronic shoulder pain (3ry area of Pain) (Left) 03/03/2021   History of total knee replacement (Bilateral) 03/03/2021   DDD (degenerative disc disease), thoracic 03/03/2021   Cervical facet hypertrophy (Multilevel) (Bilateral) 03/03/2021   DDD (degenerative disc disease), cervical 03/03/2021   BPH (benign prostatic hyperplasia) 02/14/2021   Diabetic foot ulcer ([place]) 02/13/2021   Hyperlipidemia associated with type 2 diabetes mellitus  ([place]) 11/26/2020   Normocytic anemia 11/26/2020   Chronic kidney disease (CKD) 11/26/2020   Claudication ([place]) 11/26/2020   Contusion of right thigh 11/26/2020   COPD (chronic obstructive pulmonary disease) ([place]) 11/26/2020   Dyspnea 11/26/2020   Erectile dysfunction 11/26/2020   GERD without esophagitis 11/26/2020   Gout 11/26/2020   Hyperlipidemia, acquired 11/26/2020   Hyperlipidemia 11/26/2020   Myocardial infarction ([place]) 11/26/2020   Personal history of other diseases of urinary system 11/26/2020   Right leg swelling 11/26/2020   Sleep apnea 11/26/2020   Varicose veins 11/26/2020   Venous insufficiency 11/26/2020   Vitamin D deficiency 11/26/2020   Chest pain 11/21/2020   Thrombocytopenia ([place]) 11/21/2020   Presence of permanent cardiac pacemaker    Hypertension associated with diabetes [facility])    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 11/19/2020   Pacemaker [employer] device 11/19/2020   Peripheral vascular disease, unspecified ([place]) stents to popliteal arteries many years ago 11/19/2020   History of gingival bleeding 11/19/2020   Coronary artery disease involving coronary bypass graft of native heart with angina pectoris ([place]) 11/19/2020   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 11/19/2020   DM2 (diabetes mellitus, type 2) ([place])    Closed nondisplaced fracture of greater trochanter of right femur ([place]) 01/25/2020   DDD (degenerative disc disease),  lumbar 12/10/2019   Chronic low back pain (1ry area of Pain) (Bilateral) (L>R) w/o sciatica 12/09/2019   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   Urinary retention 12/09/2019   Obesity 04/07/2019   Retinopathy 04/07/2019   Deafness, left 01/12/2019   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/12/2019   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   Bilateral tinnitus 08/24/2018   Disequilibrium 08/24/2018   Hypertrophy of nasal turbinates 08/24/2018   Hypertrophy of tonsils 08/24/2018    Obstructive sleep apnea of adult 05/30/2018   Presence of prosthetic heart valve 05/30/2018   Heart block 04/25/2018   Calculus of gallbladder with acute cholecystitis without obstruction 09/21/2017   Right bundle branch block 12/25/2016   Gastrojejunal ulcer 03/23/2014   Jejunal ulcer 03/23/2014   Insomnia 03/22/2014   Blisters of multiple sites 01/18/2014   Cellulitis 01/18/2014   OA (osteoarthritis) of knee 06/09/2013   Status post percutaneous transluminal coronary angioplasty 01/14/2011   Coronary arteriosclerosis 01/29/2009   Bipolar disorder ([place]) 01/29/2009   Depression 01/29/2009   Hearing loss 01/29/2009   PCP:  Ranae Plumber, [place] [facility]:   [place], [place] [address] Phone: [phone] Fax: [phone]     Social Determinants of Health (SDOH) Interventions    Readmission Risk Interventions Readmission Risk Prevention Plan 11/02/2021 05/07/2021 03/23/2021  Transportation Screening - Complete Complete  Medication Review (RN Care Manager) - Complete Complete  PCP or Specialist appointment within 3-5 days of discharge Complete Complete Complete  HRI or Home Care Consult - Complete Complete  SW Recovery Care/Counseling Consult Complete Complete Complete  Palliative Care Screening Not Applicable Not Applicable Not Applicable  Skilled Nursing Facility Complete Not Applicable Not Applicable

## 2021-11-08 NOTE — Progress Notes (Signed)
      Called by RN for bleeding at temp pacer site, really old pocket..  fair amount of blood and holding pressure not slowing the bleeding. Talked to Dr. Quentin Ore and with Dr. Heather Roberts help added silver nitrate with no real change in bleeding another pressure bandage added.     Will hold plavix and give 2 units platelets.  Will check CBC and type and cross.     .General:Pleasant affect, NAD but is having pain with pressure added.. Skin:Warm and dry, brisk capillary refill HEENT:normocephalic, sclera clear, mucus membranes moist Neck:supple, no JVD, no bruits  Heart:S1S2 RRR without murmur, gallup, rub or click Lungs:clear without rales, rhonchi, or wheezes HWY:SHUO, non tender, + BS, do not palpate liver spleen or masses Ext:no lower ext edema, 2+ pedal pulses, 2+ radial pulses Neuro:alert and oriented X 3, MAE, follows commands, + facial symmetry   Cecilie Kicks, FNP-C [facility]  HFG:[phone] or after 5pm and on weekends call [phone] 11/08/2021.

## 2021-11-08 NOTE — Progress Notes (Signed)
      INFECTIOUS DISEASE ATTENDING ADDENDUM:   Date: 11/08/2021  Patient name: Nathan Singleton  Medical record number: [id number]  Date of birth: March 23, 1941   Patient with history of recurrent MSSA bacteremia TAVR and pacemaker now   Well no overt vegetation was seen on TEE her device is undoubtedly infected.  Permanent pacemaker has been extracted and she has had a temporary pacemaker placed.  As was mentioned to my partner Dr. Keturah Barre note (note it is NOT being flagged in ID filter which IT needs to fix), the patient with PM infection should receive 6 weeks of cefazolin for effective treatment of this endovascular cardiac device associated infection.  In an ideal world I like to have the patient complete 6 weeks of therapy and then check surveillance blood cultures 2 weeks thereafter.  In his situation with her being pacemaker dependent and needing a temporary pacemaker this may not be ideal.  I would however want him to at minimum complete 4 weeks prior to placement of a permanent pacemaker.  We will continue to follow and are available for questions.  I will check some blood cultures today since he had device extraction   Alcide Evener 11/08/2021, 4:47 PM

## 2021-11-09 ENCOUNTER — Inpatient Hospital Stay (HOSPITAL_COMMUNITY): Payer: 59

## 2021-11-09 DIAGNOSIS — G911 Obstructive hydrocephalus: Secondary | ICD-10-CM

## 2021-11-09 DIAGNOSIS — G936 Cerebral edema: Secondary | ICD-10-CM

## 2021-11-09 DIAGNOSIS — R401 Stupor: Secondary | ICD-10-CM

## 2021-11-09 DIAGNOSIS — I629 Nontraumatic intracranial hemorrhage, unspecified: Secondary | ICD-10-CM

## 2021-11-09 DIAGNOSIS — I619 Nontraumatic intracerebral hemorrhage, unspecified: Secondary | ICD-10-CM | POA: Diagnosis not present

## 2021-11-09 LAB — PREPARE PLATELET PHERESIS
Unit division: 0
Unit division: 0

## 2021-11-09 LAB — BPAM PLATELET PHERESIS
Blood Product Expiration Date: 202211282359
Blood Product Expiration Date: 202211282359
ISSUE DATE / TIME: 202211261637
ISSUE DATE / TIME: 202211261842
Unit Type and Rh: 6200
Unit Type and Rh: 6200

## 2021-11-09 LAB — GLUCOSE, CAPILLARY
Glucose-Capillary: 165 mg/dL — ABNORMAL HIGH (ref 70–99)
Glucose-Capillary: 189 mg/dL — ABNORMAL HIGH (ref 70–99)
Glucose-Capillary: 263 mg/dL — ABNORMAL HIGH (ref 70–99)

## 2021-11-09 LAB — COMPREHENSIVE METABOLIC PANEL
ALT: <5 U/L (ref 0–44)
AST: 37 U/L (ref 15–41)
Albumin: 1.7 g/dL — ABNORMAL LOW (ref 3.5–5.0)
Alkaline Phosphatase: 92 U/L (ref 38–126)
Anion gap: 8 (ref 5–15)
BUN: 71 mg/dL — ABNORMAL HIGH (ref 8–23)
CO2: 19 mmol/L — ABNORMAL LOW (ref 22–32)
Calcium: 7.5 mg/dL — ABNORMAL LOW (ref 8.9–10.3)
Chloride: 107 mmol/L (ref 98–111)
Creatinine, Ser: 2.07 mg/dL — ABNORMAL HIGH (ref 0.61–1.24)
GFR, Estimated: 32 mL/min — ABNORMAL LOW (ref >60)
Glucose, Bld: 178 mg/dL — ABNORMAL HIGH (ref 70–99)
Potassium: 4.6 mmol/L (ref 3.5–5.1)
Sodium: 134 mmol/L — ABNORMAL LOW (ref 135–145)
Total Bilirubin: 0.8 mg/dL (ref 0.3–1.2)
Total Protein: 4.3 g/dL — ABNORMAL LOW (ref 6.5–8.1)

## 2021-11-09 LAB — CULTURE, BLOOD (ROUTINE X 2)
Culture: NO GROWTH
Culture: NO GROWTH

## 2021-11-09 LAB — CBC WITH DIFFERENTIAL/PLATELET
Abs Immature Granulocytes: 0.15 10*3/uL — ABNORMAL HIGH (ref 0.00–0.07)
Basophils Absolute: 0 10*3/uL (ref 0.0–0.1)
Basophils Relative: 0 %
Eosinophils Absolute: 0 10*3/uL (ref 0.0–0.5)
Eosinophils Relative: 0 %
HCT: 23.6 % — ABNORMAL LOW (ref 39.0–52.0)
Hemoglobin: 8 g/dL — ABNORMAL LOW (ref 13.0–17.0)
Immature Granulocytes: 1 %
Lymphocytes Relative: 3 %
Lymphs Abs: 0.3 10*3/uL — ABNORMAL LOW (ref 0.7–4.0)
MCH: 30.5 pg (ref 26.0–34.0)
MCHC: 33.9 g/dL (ref 30.0–36.0)
MCV: 90.1 fL (ref 80.0–100.0)
Monocytes Absolute: 0.4 10*3/uL (ref 0.1–1.0)
Monocytes Relative: 4 %
Neutro Abs: 10.2 10*3/uL — ABNORMAL HIGH (ref 1.7–7.7)
Neutrophils Relative %: 92 %
Platelets: 70 10*3/uL — ABNORMAL LOW (ref 150–400)
RBC: 2.62 MIL/uL — ABNORMAL LOW (ref 4.22–5.81)
RDW: 15.4 % (ref 11.5–15.5)
WBC: 11.1 10*3/uL — ABNORMAL HIGH (ref 4.0–10.5)
nRBC: 0 % (ref 0.0–0.2)

## 2021-11-09 LAB — PHOSPHORUS: Phosphorus: 3.5 mg/dL (ref 2.5–4.6)

## 2021-11-09 LAB — MAGNESIUM: Magnesium: 2.2 mg/dL (ref 1.7–2.4)

## 2021-11-09 MED ORDER — DIPHENHYDRAMINE HCL 50 MG/ML IJ SOLN: 12.5000 mg | INTRAMUSCULAR | Status: DC | PRN

## 2021-11-09 MED ORDER — LORAZEPAM 1 MG PO TABS: 1.0000 mg | ORAL_TABLET | ORAL | Status: DC | PRN

## 2021-11-09 MED ORDER — ONDANSETRON HCL 4 MG/2ML IJ SOLN: 4.0000 mg | Freq: Four times a day (QID) | INTRAMUSCULAR | Status: DC | PRN

## 2021-11-09 MED ORDER — POLYVINYL ALCOHOL 1.4 % OP SOLN
1.0000 [drp] | Freq: Four times a day (QID) | OPHTHALMIC | Status: DC | PRN
  Filled 2021-11-09: qty 15

## 2021-11-09 MED ORDER — BIOTENE DRY MOUTH MT LIQD: 15.0000 mL | OROMUCOSAL | Status: DC | PRN

## 2021-11-09 MED ORDER — ONDANSETRON 4 MG PO TBDP: 4.0000 mg | ORAL_TABLET | Freq: Four times a day (QID) | ORAL | Status: DC | PRN

## 2021-11-09 MED ORDER — CEFAZOLIN SODIUM-DEXTROSE 1-4 GM/50ML-% IV SOLN
1.0000 g | Freq: Three times a day (TID) | INTRAVENOUS | Status: DC
Start: 2021-11-09 — End: 2021-11-09
  Filled 2021-11-09: qty 50

## 2021-11-09 MED ORDER — HALOPERIDOL LACTATE 5 MG/ML IJ SOLN: 0.5000 mg | INTRAMUSCULAR | Status: DC | PRN

## 2021-11-09 MED ORDER — GLYCOPYRROLATE 0.2 MG/ML IJ SOLN: 0.2000 mg | INTRAMUSCULAR | Status: DC | PRN

## 2021-11-09 MED ORDER — LORAZEPAM 2 MG/ML PO CONC
1.0000 mg | ORAL | Status: DC | PRN
  Filled 2021-11-09: qty 0.5

## 2021-11-09 MED ORDER — HALOPERIDOL 0.5 MG PO TABS
0.5000 mg | ORAL_TABLET | ORAL | Status: DC | PRN
  Filled 2021-11-09: qty 1

## 2021-11-09 MED ORDER — FENTANYL 2500MCG IN NS 250ML (10MCG/ML) PREMIX INFUSION
0.0000 ug/h | INTRAVENOUS | Status: DC
  Administered 2021-11-09: 16:00:00 200 ug/h via INTRAVENOUS
  Filled 2021-11-09: qty 2500

## 2021-11-09 MED ORDER — HALOPERIDOL LACTATE 2 MG/ML PO CONC: 0.5000 mg | ORAL | Status: DC | PRN

## 2021-11-09 MED ORDER — NALOXONE HCL 0.4 MG/ML IJ SOLN
0.4000 mg | Freq: Once | INTRAMUSCULAR | Status: AC
  Administered 2021-11-09: 13:00:00 0.4 mg via INTRAVENOUS
  Filled 2021-11-09: qty 1

## 2021-11-09 MED ORDER — LABETALOL HCL 5 MG/ML IV SOLN
INTRAVENOUS | Status: AC
  Filled 2021-11-09: qty 4

## 2021-11-09 MED ORDER — SODIUM CHLORIDE 0.9 % IV SOLN
12.5000 mg | Freq: Four times a day (QID) | INTRAVENOUS | Status: DC | PRN
  Filled 2021-11-09: qty 0.5

## 2021-11-09 MED ORDER — GLYCOPYRROLATE 1 MG PO TABS
1.0000 mg | ORAL_TABLET | ORAL | Status: DC | PRN
  Filled 2021-11-09: qty 1

## 2021-11-09 MED ORDER — IPRATROPIUM-ALBUTEROL 0.5-2.5 (3) MG/3ML IN SOLN: 3.0000 mL | Freq: Four times a day (QID) | RESPIRATORY_TRACT | Status: DC | PRN

## 2021-11-09 MED ORDER — FENTANYL BOLUS VIA INFUSION
10.0000 ug | INTRAVENOUS | Status: DC | PRN
  Filled 2021-11-09: qty 10

## 2021-11-09 MED ORDER — LORAZEPAM 2 MG/ML IJ SOLN
1.0000 mg | INTRAMUSCULAR | Status: DC | PRN
  Administered 2021-11-09: 16:00:00 1 mg via INTRAVENOUS
  Filled 2021-11-09: qty 1

## 2021-11-09 MED ORDER — NALOXONE HCL 0.4 MG/ML IJ SOLN
INTRAMUSCULAR | Status: AC
  Filled 2021-11-09: qty 1

## 2021-11-09 MED ORDER — NALOXONE HCL 0.4 MG/ML IJ SOLN
0.4000 mg | INTRAMUSCULAR | Status: DC | PRN
  Administered 2021-11-09: 14:00:00 0.4 mg via INTRAVENOUS

## 2021-11-09 NOTE — Progress Notes (Addendum)
[employer] has been contacted for temporary pacemaker, since this is not an ICD, [employer] has nothing to turn off per rep.   Chrisandra Carota, RN 11/09/2021 5:02 PM

## 2021-11-09 NOTE — Progress Notes (Signed)
    OVERNIGHT PROGRESS REPORT  Notified by RN that patient has expired at 2300 on 11-09-2021  Patient was comfort care  2 RN verified.  Family to be notified by RN  Kristopher Oppenheim, DO [facility]

## 2021-11-09 NOTE — Consult Note (Signed)
NEUROLOGY CONSULTATION NOTE   Date of service: November 09, 2021 Patient Name: Nathan Singleton MRN:  [id number] DOB:  Jan 16, 1941 Reason for consult: ICH Requesting physician: Dr. Dia Crawford _ _ _   _ __   _ __ _ _  __ __   _ __   __ _  History of Present Illness   This is a 80 year old gentleman with a past medical history of coronary artery disease, sick sinus syndrome with pacemaker, aortic stent stenosis status post TAVR, diabetes type 2, CKD stage IIIa, and 2 episodes of MSSA bacteremia earlier this year, now on his third episode of MSSA bacteremia and transferred from [facility] for pacemaker extraction. Neurology is consulted for large acute ICH on head CT.   He underwent removal of his pacemaker on 11/25 with implantation of a temporary pacer in the left chest wall.  He has been thrombocytopenic since admission and was transfused 1 unit of platelets yesterday.  Plavix and pharmacologic VTE prophylaxis has been held in the setting.  His platelets are 70 today.  He was noted to have a mental status change early this afternoon.  He had been slightly confused since yesterday because he was given narcotics for pain relief overnight.  He was given 0.4 of Narcan twice this afternoon by rapid response with little improvement.  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    ROS   UTA 2/2 unresponsiveness  Past History   I have reviewed the following:  Past Medical History:  Diagnosis Date   Aneurysm (arteriovenous) of coronary vessels    Arthritis    Ascending aortic aneurysm    CAD (coronary artery disease)    Diabetes mellitus  without complication (HCC)    Hard of hearing    History of GI bleed    Hyperlipidemia associated with type 2 diabetes mellitus ([place])    Hypertension associated with diabetes ([place])    Presence of permanent cardiac pacemaker    PVD (peripheral vascular disease) ([place])    S/P TAVR (transcatheter aortic valve replacement)    Past Surgical History:  Procedure Laterality Date   AMPUTATION TOE Right 03/20/2021   Procedure: AMPUTATION TOE;  Surgeon: Sharlotte Alamo, DPM;  Location: [facility] ORS;  Service: Podiatry;  Laterality: Right;   AMPUTATION TOE Right 06/03/2021   Procedure: AMPUTATION TOE-SECOND TOE;  Surgeon: [name], DPM;  Location: [facility] ORS;  Service: Podiatry;  Laterality: Right;   AMPUTATION TOE Right 07/26/2021   Procedure: AMPUTATION TOE-THIRD TOE;  Surgeon: [name], DPM;  Location: [facility] ORS;  Service: Podiatry;  Laterality: Right;   Ankle repaired Right    Carpal Tunnel repaired Left    CATARACT EXTRACTION Bilateral    COLONOSCOPY     Copillar implant      DG CHOLECYSTOGRAPHY GALL BLADDER ([facility] HX)     GASTRIC BYPASS     heart valve replaced     IRRIGATION AND DEBRIDEMENT FOOT Right 02/18/2021   Procedure: IRRIGATION AND DEBRIDEMENT FOOT-Right Great Toe;  Surgeon: [name], DPM;  Location: [facility] ORS;  Service: Podiatry;  Laterality: Right;   LOWER EXTREMITY ANGIOGRAPHY Right 02/17/2021   Procedure: Lower Extremity Angiography;  Surgeon: Algernon Huxley, MD;  Location: [place] [facility];  Service: Cardiovascular;  Laterality: Right;   LOWER EXTREMITY ANGIOGRAPHY Right 03/27/2021   Procedure: Lower Extremity Angiography;  Surgeon: Algernon Huxley, MD;  Location: [place] [facility];  Service: Cardiovascular;  Laterality: Right;   PACEMAKER GENERATOR CHANGE     REPLACEMENT TOTAL KNEE BILATERAL     RTC     STENT PLACE LEFT URETER ([place] HX) Right    Right Leg   TEE WITHOUT CARDIOVERSION N/A 03/26/2021   Procedure: TRANSESOPHAGEAL ECHOCARDIOGRAM (TEE);  Surgeon: Kate Sable, MD;  Location: [facility] OR[facility];  Service: Cardiovascular;  Laterality: N/A;   TEE WITHOUT CARDIOVERSION N/A 06/04/2021   Procedure: TRANSESOPHAGEAL ECHOCARDIOGRAM (TEE);  Surgeon: Kate Sable, MD;  Location: [facility] OR[facility];  Service: Cardiovascular;  Laterality: N/A;   TEE WITHOUT CARDIOVERSION N/A 11/03/2021   Procedure: TRANSESOPHAGEAL ECHOCARDIOGRAM (TEE);  Surgeon: Wellington Hampshire, MD;  Location: [facility] OR[facility];  Service: Cardiovascular;  Laterality: N/A;   Toe nail removed Bilateral    Family History  Problem Relation Age of Onset   Heart disease Mother    Alzheimer's disease Mother    Heart disease Father    Diabetes Father    Social History   Socioeconomic History   Marital status: Single    Spouse name: Not on file   Number of children: Not on file   Years of education: Not on file   Highest education level: Not on file  Occupational History   Not on file  Tobacco Use   Smoking status: Former   Smokeless tobacco: Never  Vaping Use   Vaping Use: Never used  Substance and Sexual Activity   Alcohol use: Not Currently   Drug use: Not Currently   Sexual activity: Not on file  Other Topics Concern   Not on file  Social History Narrative   Not on file   Social Determinants of Health   Financial Resource Strain: Not on file  Food Insecurity: Not on file  Transportation Needs: Not on file  Physical Activity: Not on file  Stress: Not on file  Social Connections: Not on file   Allergies  Allergen Reactions   Atorvastatin Other (See Comments)    "bad for kidneys" Cramping    Celecoxib Other (See Comments)    Other reaction(s): Other (See Comments) Kidney Problem Kidney Problem    Dicyclomine Hcl Other (See Comments)    Stomach cramps   Doxycycline Other (See Comments)    Per wife (per RN)- pt gets severe stomach pains even when takes with food and PCP said to not take     Metformin Other (See Comments)    Medications   Medications Prior to Admission   Medication Sig Dispense Refill Last Dose   acetaminophen (TYLENOL) 325 MG tablet Take 2 tablets (650 mg total) by mouth every 6 (six) hours as needed for fever, headache or moderate pain.   10/30/2021   albuterol (VENTOLIN HFA) 108 (90 Base) MCG/ACT inhaler Inhale 2 puffs into the lungs every 4 (four) hours as needed for wheezing or shortness of breath. 18 g 0 10/30/2021   allopurinol (ZYLOPRIM) 100 MG tablet Take 100 mg by mouth daily.   10/30/2021   amLODipine (NORVASC) 10 MG tablet Take 10 mg by mouth daily.   10/30/2021   aspirin 81 MG EC tablet Take 81 mg by mouth daily.   10/30/2021   budesonide-formoterol (SYMBICORT) 80-4.5 MCG/ACT inhaler Inhale 2 puffs into the lungs daily as needed (wheezing).   10/30/2021   CALCIUM CITRATE+D3 PETITES 200-250 MG-UNIT TABS Take 1 tablet  by mouth daily.   10/30/2021   carvedilol (COREG) 6.25 MG tablet Take 6.25 mg by mouth 2 (two) times daily.   10/30/2021 at 8.30 am   Cholecalciferol (VITAMIN D3) 125 MCG (5000 UT) CAPS Take 5,000 Units by mouth daily.   10/30/2021   clopidogrel (PLAVIX) 75 MG tablet Take 75 mg by mouth daily.   10/30/2021 at 8.30 am   docusate sodium (COLACE) 100 MG capsule Take 200 mg by mouth 2 (two) times daily.   10/30/2021   gabapentin (NEURONTIN) 100 MG capsule Take 100 mg by mouth daily.   10/30/2021   HYDROcodone-acetaminophen (NORCO/VICODIN) 5-325 MG tablet Take 1 tablet by mouth every 6 (six) hours as needed for moderate pain.   Past Month   insulin glargine (LANTUS) 100 UNIT/ML injection Inject 22 Units into the skin at bedtime.   10/30/2021   isosorbide mononitrate (IMDUR) 30 MG 24 hr tablet Take 30 mg by mouth daily.   10/30/2021   lactulose, encephalopathy, (CHRONULAC) 10 GM/15ML SOLN Take 15 mLs by mouth daily as needed (constipation).   10/14/2021   liraglutide (VICTOZA) 18 MG/3ML SOPN Inject 1.8 mg into the skin daily.   10/30/2021   Multiple Vitamin (DAILY-VITE MULTIVITAMIN PO) Take 1 tablet by mouth daily.   10/30/2021    multivitamin (ONE-A-DAY MEN'S) TABS tablet Take 1 tablet by mouth daily.   10/30/2021   pantoprazole (PROTONIX) 40 MG tablet Take 1 tablet (40 mg total) by mouth daily. 90 tablet 1 10/30/2021   rosuvastatin (CRESTOR) 40 MG tablet Take 40 mg by mouth daily.   10/30/2021   saccharomyces boulardii (FLORASTOR) 250 MG capsule Take 1 capsule (250 mg total) by mouth 2 (two) times daily. (Patient taking differently: Take 250 mg by mouth daily.)   10/30/2021   sertraline (ZOLOFT) 100 MG tablet Take 100 mg by mouth daily.   10/30/2021   sucralfate (CARAFATE) 1 g tablet Take 1 g by mouth 4 (four) times daily.   10/30/2021   tamsulosin (FLOMAX) 0.4 MG CAPS capsule Take 0.4 mg by mouth daily.   10/30/2021      Current Facility-Administered Medications:    0.9 %  sodium chloride infusion, , Intravenous, Continuous, Allie Bossier, MD, Last Rate: 10 mL/hr at 11/08/21 0045, Restarted at 11/08/21 0045   acetaminophen (TYLENOL) tablet 650 mg, 650 mg, Oral, Q6H PRN, 650 mg at 11/08/21 1610 **OR** acetaminophen (TYLENOL) suppository 650 mg, 650 mg, Rectal, Q6H PRN, Opyd, Ilene Qua, MD   albuterol (PROVENTIL) (2.5 MG/3ML) 0.083% nebulizer solution 2.5 mg, 2.5 mg, Nebulization, Q4H PRN, Pham, Minh Q, RPH-CPP   labetalol (NORMODYNE) 5 MG/ML injection, , , ,    lidocaine (XYLOCAINE) 1 % (with pres) injection 5 mL, 5 mL, Intradermal, Once, Vickie Epley, MD   mupirocin ointment (BACTROBAN) 2 % 1 application, 1 application, Nasal, BID, Allie Bossier, MD, 1 application at 96/04/54 0939   sodium chloride flush (NS) 0.9 % injection 3 mL, 3 mL, Intravenous, Q12H, Baldwin [place], PA-C, 3 mL at 11/08/21 2022   sodium phosphate 40 mmol in dextrose 5 % 250 mL infusion, 40 mmol, Intravenous, Once, Allie Bossier, MD  Vitals   Vitals:   11/09/21 0337 11/09/21 0803 11/09/21 0900 11/09/21 1156  BP: 126/66  (!) 159/73 (!) 159/77  Pulse: 80  80 79  Resp: 20  16 16   Temp: 98.9 F (37.2 C)  97.9 F (36.6 C) 97.6  F (36.4 C)  TempSrc: Axillary  Oral Oral  SpO2: 98% 97%  98% 100%  Weight: 112.9 kg     Height:         Body mass index is 30.3 kg/m.  Physical Exam   Physical Exam Gen: obtunded CV: RRR (paced) Resp: increased WOB with periods of apnea  Neurologic GCS = 7 (E1, V2, M4) MS: obtunded, will not awake to noxious stimuli, will not follow any commands Speech: unintelligble CN: PERRL, (+) oculocephalics and corneals, R UMN facial droop Motor & sensory: minimal withdrawal in all extremities to noxious stimuli, no spontaneous movement  ICH score = 4 (30 day mortality of 97%)  Labs   CBC:  Recent Labs  Lab 11/08/21 0431 11/08/21 1459 11/09/21 0500  WBC 11.0* 10.8* 11.1*  NEUTROABS 10.0*  --  10.2*  HGB 9.0* 9.0* 8.0*  HCT 25.8* 26.6* 23.6*  MCV 89.0 90.8 90.1  PLT 66* 65* 70*    Basic Metabolic Panel:  Lab Results  Component Value Date   NA 134 (L) 11/09/2021   K 4.6 11/09/2021   CO2 19 (L) 11/09/2021   GLUCOSE 178 (H) 11/09/2021   BUN 71 (H) 11/09/2021   CREATININE 2.07 (H) 11/09/2021   CALCIUM 7.5 (L) 11/09/2021   GFRNONAA 32 (L) 11/09/2021   GFRAA 76 11/19/2020   Lipid Panel:  Lab Results  Component Value Date   LDLCALC 100 (H) 11/19/2020   HgbA1c:  Lab Results  Component Value Date   HGBA1C 6.9 (H) 10/17/2021   Urine Drug Screen: No results found for: LABOPIA, COCAINSCRNUR, LABBENZ, AMPHETMU, THCU, LABBARB  Alcohol Level No results found for: ETH    Impression   This is a 80 year old gentleman with a past medical history of coronary artery disease, sick sinus syndrome with pacemaker, aortic stent stenosis status post TAVR, diabetes type 2, CKD stage IIIa, recurrent MSSA bacteremia, admitted with third episode of MSSA bacteremia. He developed a mental status change today and head CT was performed which showed a large intracranial hemorrhage, infra- and supra-tentorial, involving bilateral cerebellum, L parietal lobe with intraventricular extension and  obstructive hydrocephalus. Etiology of hemorrhage is unclear, given his known MSSA bacteremia and thrombocytopenia he may have had a septic embolic event that then underwent hemorrhagic conversion. His ICH score is 4 which predicts 97% mortality at 30 days. Myself and Dr. Sherral Hammers spoke to patients fiance at bedside and his daughter Raymon Mutton on speakerphone. 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  I stated that aggressive measures including intubation/ventilation, aggressive mgmt of cerebral edema, and neurosurgical decompression would likely be futile. They expressed that he had told them clearly that he would not want to be resuscitated or put on life support if there is not a meaningful chance of recovery. Jody HCPOA and patient's fiance agreed that they wished to pursue comfort care measures only.   Recommendations    - Comfort care ______________________________________________________________________   Thank you for the opportunity to take part in the care of this patient. If you have any further questions, please contact the neurology consultation attending.  Signed,  Su Monks, MD [facility] [phone]  If 7pm- 7am, please page neurology on call as listed in [place].  Any copied and pasted documentation in this consult note was written in another application by me and transferred to this note.

## 2021-11-09 NOTE — Significant Event (Signed)
Rapid Response Event Note   Reason for Call :  Difficult to arouse  Initial Focused Assessment:  Called to see this patient because she was worried that he had received too much pain medication.   The patient was given morphine 2mg  yesterday, 11/08/21, as well as 5/325 of Norco at 2035. He was given 0.4 of Narcan x 2 with little response. He has CKD with a GFR of 32 and a CrCl of 39.8.  He responds to pain but falls back asleep. I was unable to get him to follow any commands or answer questions. The wife mentioned that she thought that his voice seemed slurred earlier. 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 Vital signs are stable. PERRLA   I was unable to perform a NIH because of his state. He responds to pain bilaterally on UE and LE. MD ordered head CT.   Interventions:  Narcan x 2 given  Plan of Care:  Patient will receive a head CT   Event Summary:   MD Notified: Dr. Sherral Hammers Call Time: [place] Time: [place], RN

## 2021-11-09 NOTE — Progress Notes (Addendum)
Patient sleeping, VSS, only responding to voice and pain, Narcan Administered, MD notified. Rapid Response notified, Charge Nurse Notified.   Chrisandra Carota, RN 11/09/2021 12:58 PM

## 2021-11-09 NOTE — Progress Notes (Signed)
Pharmacy Antibiotic Note  Nathan Singleton is a 80 y.o. male with past medical history of hypertension, diabetes, CAD, TAVR, pacemaker, CKD, and peripheral vascular disease who presents to the ED for abnormal labs.   Pt was seen in ED 11/17 d/t fever and 1 week of worsening arm pain w/ skin tears on forearms. Pt was sent home on Keflex. Since then, pt blood cultures from 11/17 came back with 4/4 bottles positive for MSSA and pt was called back into ED for reevaluation. Pharmacy has been consulted for cefazolin dosing bacteremia.  TEE- no left atrial/left atrial appendage thrombus detected. On day 10 of antibiotics. Afebrile, WBC WNL. Scr 2.07 above baseline.  Patient was transferred from [facility] for pacemaker removal (11/25). ID planning for 6 weeks of therapy and surveillance blood cultures every 2 weeks. Will need at least 4 weeks of antibiotics before another PPM can be placed.   Plan: Continue cefazolin 2 grams IV q12h Follow-up on repeat cultures Monitor renal function, dose adjust as needed  Height: 6\' 4"  (193 cm) Weight: 112.9 kg (248 lb 14.4 oz) IBW/kg (Calculated) : 86.8  Temp (24hrs), Avg:98.3 F (36.8 C), Min:97.4 F (36.3 C), Max:99.8 F (37.7 C)  Recent Labs  Lab 11/05/21 0142 11/06/21 0122 10/19/2021 0213 11/08/21 0431 11/08/21 1459 11/09/21 0500  WBC 3.6* 5.3 9.8 11.0* 10.8* 11.1*  CREATININE 1.75* 2.39* 3.10* 2.73*  --  2.07*     Estimated Creatinine Clearance: 39.8 mL/min (A) (by C-G formula based on SCr of 2.07 mg/dL (H)).    Allergies  Allergen Reactions   Atorvastatin Other (See Comments)    "bad for kidneys" Cramping    Celecoxib Other (See Comments)    Other reaction(s): Other (See Comments) Kidney Problem Kidney Problem    Dicyclomine Hcl Other (See Comments)    Stomach cramps   Doxycycline Other (See Comments)    Per wife (per RN)- pt gets severe stomach pains even when takes with food and PCP said to not take     Metformin Other (See Comments)     Antimicrobials this admission: 11/18 cefazolin >>    Microbiology results: 11/17 Bcx: MSSA 4/4 11/18 Bcx: MSSA 4/4 11/22 blood: NGF 11/26 BCx: NGTD  Laurey Arrow, PharmD PGY1 Pharmacy Resident 11/09/2021  2:21 PM  Please check [email or web address] for unit-specific pharmacy phone numbers.

## 2021-11-09 NOTE — Progress Notes (Signed)
Progress Note  Patient Name: Nathan Singleton Date of Encounter: 11/09/2021  [place] Cardiologist: None   Subjective   NAEO.  Bandage c/d/I this AM without evidence of bleeding.  Inpatient Medications    Scheduled Meds:  sodium chloride   Intravenous Once   sodium chloride   Intravenous Once   allopurinol  100 mg Oral Daily   Chlorhexidine Gluconate Cloth  6 each Topical Daily   gabapentin  100 mg Oral Daily   insulin aspart  0-20 Units Subcutaneous Q4H   insulin aspart  10 Units Subcutaneous TID WC   insulin glargine-yfgn  11 Units Subcutaneous QHS   lidocaine  5 mL Intradermal Once   mometasone-formoterol  2 puff Inhalation BID   mupirocin ointment  1 application Nasal BID   pantoprazole  40 mg Oral BID   rosuvastatin  40 mg Oral QHS   saccharomyces boulardii  250 mg Oral BID   sertraline  200 mg Oral Daily   sodium chloride flush  3 mL Intravenous Q12H   sucralfate  1 g Oral QID   tamsulosin  0.4 mg Oral BID   Continuous Infusions:  sodium chloride 10 mL/hr at 11/08/21 0045    ceFAZolin (ANCEF) IV 1 g (11/08/21 2210)   sodium bicarbonate 150 mEq in D5W infusion Stopped (11/08/21 0044)   sodium phosphate  Dextrose 5% IVPB     PRN Meds: acetaminophen **OR** acetaminophen, albuterol, alum & mag hydroxide-simeth, HYDROcodone-acetaminophen, ondansetron (ZOFRAN) IV, silver nitrate applicators   Vital Signs    Vitals:   11/08/21 2111 11/08/21 2300 11/09/21 0337 11/09/21 0803  BP:  125/64 126/66   Pulse:  80 80   Resp: (!) 23 (!) 21 20   Temp:  99 F (37.2 C) 98.9 F (37.2 C)   TempSrc:  Axillary Axillary   SpO2:  98% 98% 97%  Weight:   112.9 kg   Height:        Intake/Output Summary (Last 24 hours) at 11/09/2021 0848 Last data filed at 11/08/2021 2112 Gross per 24 hour  Intake 953.72 ml  Output 2100 ml  Net -1146.28 ml    Last 3 Weights 11/09/2021 11/08/2021 11/01/2021  Weight (lbs) 248 lb 14.4 oz 248 lb 7.3 oz 246 lb 14.6 oz  Weight (kg)  112.9 kg 112.7 kg 112 kg      Telemetry    V pacing. Personally Reviewed  ECG    Personally Reviewed  Physical Exam   GEN: No acute distress.   Neck: No JVD Cardiac: RRR, no murmurs, rubs, or gallops. Temp-perm in place without hematoma on LIJ. Respiratory: Clear to auscultation bilaterally. GI: Soft, nontender, non-distended  MS: No edema; No deformity. Neuro:  Nonfocal  Psych: Normal affect   Labs    High Sensitivity Troponin:   Recent Labs  Lab 10/17/21 0726 10/17/21 1112 10/17/21 1305 10/31/21 1210 10/31/21 1502  TROPONINIHS 94* 89* 81* 277* 286*      Chemistry Recent Labs  Lab 10/23/2021 0213 11/08/21 0431 11/09/21 0500  NA 129* 130* 134*  K 4.1 4.2 4.6  CL 102 103 107  CO2 17* 17* 19*  GLUCOSE 245* 222* 178*  BUN 81* 84* 71*  CREATININE 3.10* 2.73* 2.07*  CALCIUM 7.3* 7.5* 7.5*  MG 2.1 2.4 2.2  PROT 4.6* 4.7* 4.3*  ALBUMIN 1.9* 2.0* 1.7*  AST 84* 56* 37  ALT 7 <5 <5  ALKPHOS 90 98 92  BILITOT 0.6 0.7 0.8  GFRNONAA 20* 23* 32*  ANIONGAP 10 10  8     Lipids No results for input(s): CHOL, TRIG, HDL, LABVLDL, LDLCALC, CHOLHDL in the last 168 hours.  Hematology Recent Labs  Lab 11/08/21 0431 11/08/21 1459 11/09/21 0500  WBC 11.0* 10.8* 11.1*  RBC 2.90* 2.93* 2.62*  HGB 9.0* 9.0* 8.0*  HCT 25.8* 26.6* 23.6*  MCV 89.0 90.8 90.1  MCH 31.0 30.7 30.5  MCHC 34.9 33.8 33.9  RDW 15.3 15.5 15.4  PLT 66* 65* 70*    Thyroid No results for input(s): TSH, FREET4 in the last 168 hours.  BNPNo results for input(s): BNP, PROBNP in the last 168 hours.  DDimer No results for input(s): DDIMER in the last 168 hours.   Radiology    DG C-Arm 1-60 Min-No Report  Result Date: 10/18/2021 Fluoroscopy was utilized by the requesting physician.  No radiographic interpretation.    Cardiac Studies   No new  Patient Profile     80 y.o. male  admitted with CIED associated infection, now s/p extraction.  Assessment & Plan    #Recurrent bacteremia S/P  CIED extraction 10/31/2021. Temp perm in place in in L IJ Maintain pressure bandage through weekend ID following--recommend 4 weeks of IV abx prior to reimplant.  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

## 2021-11-09 NOTE — Progress Notes (Signed)
A line removed, held pressure for 10 minutes, pressure dressing applied, no evidence of bleeding.   Chrisandra Carota, RN 11/09/2021 10:50 AM

## 2021-11-09 NOTE — Code Documentation (Signed)
Stroke Response Nurse Documentation Code Documentation  Brayson Livesey is a 80 y.o. male arriving to [facility] ED via Private Vehicle on 11/09/2021 at 1908 with past medical hx of diabetes, CKD IIIa, MSSA bacteremia. On No antithrombotic. Code stroke was activated by rapid response RN.   The patient was here for a pacemaker exchange and was being treated for MSSA bacteremia. RN had called stating that she was worried that the patient had received too much narcotic. After no change in LOC after 2 Narcan, a head CT was ordered. Head CT showed a bleed and code stroke was activated.   Patient to CT with team, see documentation for details and code stroke times. Patient with decreased LOC on exam. Unable to obtain an NIH on this patient because he is obtunded. The following imaging was completed:  CT. Patient is not a candidate for IV Thrombolytic due to bleed . Family conferred and decided to make the patient comfort care. Code stroke cancelled at [place]  Stroke Response RN

## 2021-11-09 NOTE — Progress Notes (Signed)
PROGRESS NOTE    Macedonio Scallon  XAJ:[id number] DOB: 1941/10/29 DOA: 10/26/2021 PCP: Ranae Plumber, PA   Brief Narrative:  Nathan Singleton is a pleasant 80 y.o. WM PMHx coronary artery disease, SSS with pacemaker, AS s/p TAVR,insulin-dependent diabetes mellitus, CKD stage IIIa,  and 2 episodes of MSSA bacteremia earlier this year attributed to right foot infection and managed with second toe amputation and 6 weeks IV cefazolin, now with his third episode of MSSA bacteremia and transferred from [facility] for pacemaker extraction.    [facility] Hospital Course: Patient was seen in the [facility] ED on 10/30/2021 with fevers, had blood cultures collected, and went home with Keflex.  He continued to have fever to 103 F at home that night, the cultures started growing gram-positive cocci, and he came back into the [facility] ED the following day where he had fluid responsive hypotension and-year-old was admitted (11/18 [facility]).   ID provided consultation for the recurrent bacteremia which ended up being MSSA again, patient underwent TEE on 11/03/2021 with no evidence for endocarditis.  He was continued on IV cefazolin, transferred to [facility] for extraction of pacemaker per ID recommendation, and will need long-term suppressive therapy in light of his prosthetic AV.   He appears to have chronic intermittent thrombocytopenia, platelet count 70 on admission, then decreased to the 40 range where it remained stable for the 4 days leading up to transfer.  Plavix and pharmacologic VTE prophylaxis were held for this reason and he was continued on baby aspirin.   Troponin was elevated to the 200s on admission, he was evaluated by cardiology, had preserved EF on echo, and this was attributed to demand ischemia in the setting of sepsis with hypotension and renal insufficiency.   Transaminases were elevated during the admission with normal alkaline phosphatase and normal bilirubin.  Statin was held and transaminases have been  trending down.   He developed loose stools without abdominal pain or leukocytosis. This was felt to be related to antibiotics, laxatives were held, and probiotic started.    Subjective: 11/27 afebrile overnight  ADDENDUM; patient suffered massive hemorrhagic stroke.  Family has decided on comfort care measures.   Assessment & Plan:  Covid vaccination; vaccinated 3/3  Principal Problem:   MSSA bacteremia Active Problems:   Coronary artery disease involving coronary bypass graft of native heart with angina pectoris (HCC)   S/P TAVR (transcatheter aortic valve replacement)   Thrombocytopenia (HCC)   Anemia of chronic disease   CKD stage G3a/A3, GFR 45-59 and albumin creatinine ratio >300 mg/g (HCC)   Type 2 diabetes mellitus with diabetic chronic kidney disease (HCC)   CAD (coronary artery disease)   Hypokalemia   Elevated transaminase level   Sick sinus syndrome ([place])   Diabetes mellitus type 2, controlled, with complications ([place])   Diabetic nephropathy ([place])   Acute urinary retention   Hemorrhagic stroke ([place])  Acute metabolic encephalopathy/Acute Hemorrhagic stroke - 11/27 May be secondary to multiple doses of pain medication in the last 24 hours and a elderly renal failure patient -11/27 Narcan 0.4 mg x 1.  Repeat if required.  If patient returns to previous baseline cognitively will know secondary to iatrogenic overdose of narcotics. - 11/27 if patient cognition continues to remain altered post Narcan obtain CT brain. -11/27 CT brain showed large hemorrhagic stroke see below.  Code stroke was called, stroke team arrived.  Discussed case with Dr. Su Monks.  Dr. Quinn Axe and I spoke with family.  Given severity of stroke and patient's  previously expressed wishes to his fiance and daughter comfort care was decided upon. - 11/27 Family has decided on comfort care measures.  MSSA bacteremia  - Admitted to [facility] on 10/31/21 with 3rd episode of MSSA bacteremia in a yr, seen by  ID, started on cefazolin, had no evidence for endocarditis on TEE 11/03/21, and was transferred to [facility] for pacemaker extraction  - Blood cultures repeated 10/16/2021  - Cardiology coordinating pacemaker extraction, continue cefazolin, will need long-term suppressive antibiotics per ID at [facility]   -11/23 spoke with card master patient is on their list to be seen today.  Explained to patient and wife will be up to cardiology for exact timing when they remove pacer given his bacteremia, and how long he will have to remain on antibiotics prior to them reinserting new pacer. - 11/24 per ID note from last night on 11/23 In the setting of retained TAVR will need suppressive antibiotics after completion of at least 6 weeks of IV cefazolin -11/25 s/p pacer removal with implantation of temporary pacer left chest wall - 11/27 Family has decided on comfort care measures.   Thrombocytopenia - Platelets 70 on admission, stable in 40-range for the 4 days leading up to transfer to [facility]  - Hx of intermittent thrombocytopenia going back at least a yr   -11/26 patient has bleeding or surgical site left chest wall. - 11/26 transfuse 1 unit platelets - Continue to hold Plavix and pharmacologic VTE ppx, continue daily CBC    Latest Reference Range & Units Most Recent 11/06/2021 02:13 11/08/21 04:31 11/08/21 14:59 11/09/21 05:00  Platelets 150 - 400 K/uL 70 (L) 11/09/21 05:00 56 (L) 66 (L) 65 (L) 70 (L)  (L): Data is abnormally low    Elevated transaminase -Resolving - Continue to hold statin   Sick sinus syndrome, s/p pacemaker -Plan extraction of pacemaker by cardiology - Per cardiology hemoglobin goal> 9 - Strict in and out -333.21ml - Daily weight Filed Weights   10/18/2021 0348 11/08/21 0428 11/09/21 0337  Weight: 112 kg 112.7 kg 112.9 kg     CAD - No anginal complaints  - Troponin was elevated to 200s on admission to [facility] on 11/18, seen by cardiology there, had preserved EF on echo, and was attributed to  demand ishcemia in setting of bacteremia with hypotension, renal insufficiency, and anemia   AS s/p TAVR  - No evidence for endocarditis on TEE 11/03/21  -See MSSA bacteremia   DM type II controlled with complication/DM Nephropathy  - A1c was 6.9% in November 2022  -Semglee 11 units daily - 11/25 increase resistant SSI -11/26 NovoLog 10 units QAC  Acute on CKD Stage IIIa (baseline Cr~1.59) - Monitor renal function closely Lab Results  Component Value Date   CREATININE 2.07 (H) 11/09/2021   CREATININE 2.73 (H) 11/08/2021   CREATININE 3.10 (H) 11/09/2021   CREATININE 2.39 (H) 11/06/2021   CREATININE 1.75 (H) 11/05/2021  -11/25 Foley placed yesterday secondary to urinary retention, sodium bicarb started today.  If renal function continues to deteriorate over the next several days will need to consult nephrology.  Expect that it should turn around. -11/26 improving on bicarb drip.  Acute urinary Retention - Place Foley catheter for comfort. - 11/24 increase Flomax 0.4 mg BID  Hypokalemia -Potassium goal> 4  Hypomagnesmia - Magnesium goal> 2   Hypophosphatemia - Phosphorus goal> 2.5 - Sodium phosphate IV 40 mmol  Hypocalcemia - 11/24 corrected calcium= 9.0  Hyponatremia - 11/25 with patient's increasing acute renal failure,  and decreasing HCO3 see normal saline -11/25 bicarb drip 68ml/hr  Goals of care - 11/27 Family has decided on comfort care measures secondary to severe hemorrhagic stroke.     DVT prophylaxis: SCD Code Status: Full Family Communication: 11/27 wife at bedside for discussion of plan of care all questions answered Status is: Inpatient    Dispo: The patient is from: Home              Anticipated d/c is to: Home              Anticipated d/c date is: 3 days              Patient currently is not medically stable to d/c.      Consultants:  Cardiology   Procedures/Significant Events:  Echo 10/31/21  1. Left ventricular ejection fraction,  by estimation, is 60 to 65%. The  left ventricle has normal function. The left ventricle has no regional  wall motion abnormalities. There is mild left ventricular hypertrophy.  Left ventricular diastolic parameters  are consistent with Grade I diastolic dysfunction (impaired relaxation).   2. Right ventricular systolic function is normal. The right ventricular  size is normal. Tricuspid regurgitation signal is inadequate for assessing  PA pressure.   11/21 TEE   Left Ventricle: Left ventricular ejection fraction, by estimation, is 55  to 60%. The left ventricle has normal function. The left ventricular  internal cavity size was normal in size.   Right Ventricle: The right ventricular size is normal. No increase in  right ventricular wall thickness. Right ventricular systolic function is  normal.  Aortic Valve: The aortic valve has been repaired/replaced. Aortic valve  regurgitation is not visualized. There is a Sapien prosthetic, stented  (TAVR) valve present in the aortic position. Echo findings are consistent  with normal structure and function of   the aortic valve prosthesis.  11/25 s/p pacer removal with implantation of temporary pacer left chest wall 11/26 transfuse 1 unit platelets 11/27 CT head W0 contrast: -Acute intracranial hemorrhage, which predominantly is parenchymal involving the cerebellar hemispheres, right greater than left, with smaller parenchymal hemorrhages in the cortical/subcortical region of the left parietal lobe. Trace left parietal lobe subarachnoid hemorrhage, right lateral ventricle and fourth ventricle intraventricular hemorrhage.  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    I have personally reviewed and interpreted all radiology studies and my findings are as above.  VENTILATOR SETTINGS: 2 L O2 nasal cannula 11/25 SPO2 100%   Cultures 11/17 blood LEFT wrist positive MSSA 11/17 blood LEFT AC positive MSSA 11/18  urine insignificant growth 11/18 blood RIGHT wrist positive MSSA 11/20 blood LEFT wrist positive MSSA  11/22 blood LEFT hand NGTD 11/22 blood LEFT hand NGTD    Antimicrobials: Anti-infectives (From admission, onward)    Start     Ordered Stop   11/09/2021 2200  ceFAZolin (ANCEF) IVPB 2g/100 mL premix        10/28/2021 2035           Devices    LINES / TUBES:      Continuous Infusions:  sodium chloride 10 mL/hr at 11/08/21 0045   chlorproMAZINE (THORAZINE) IV     fentaNYL infusion INTRAVENOUS 200 mcg/hr (11/09/21 1626)     Objective: Vitals:   11/09/21 0803 11/09/21 0900 11/09/21 1156 11/09/21 1600  BP:  (!) 159/73 (!) 159/77 (!) 151/69  Pulse:  80 79 80  Resp:  16 16 20   Temp:  97.9 F (36.6 C) 97.6 F (36.4  C) 97.6 F (36.4 C)  TempSrc:  Oral Oral Oral  SpO2: 97% 98% 100% 98%  Weight:      Height:        Intake/Output Summary (Last 24 hours) at 11/09/2021 1725 Last data filed at 11/09/2021 1626 Gross per 24 hour  Intake 1440.48 ml  Output 1900 ml  Net -459.52 ml   Filed Weights   10/22/2021 0348 11/08/21 0428 11/09/21 0337  Weight: 112 kg 112.7 kg 112.9 kg    Physical Exam:  General: A/O x0 No acute respiratory distress, withdraws to painful stimuli Eyes: negative scleral hemorrhage, negative anisocoria, negative icterus, pinpoint pupils ENT: Negative Runny nose, negative gingival bleeding, Neck:  Negative scars, masses, torticollis, lymphadenopathy, JVD Lungs: Clear to auscultation bilaterally without wheezes or crackles Cardiovascular: Regular rate and rhythm (paced) without murmur gallop or rub normal S1 and S2 Abdomen: negative abdominal pain, nondistended, positive soft, bowel sounds, no rebound, no ascites, no appreciable mass Extremities: No significant cyanosis, clubbing, or edema bilateral lower extremities Skin: Negative rashes, lesions, ulcers Psychiatric: Unable to evaluate altered mental status Central nervous system: Unable to  evaluate altered mental status.  Withdraws to painful stimuli  .     Data Reviewed: Care during the described time interval was provided by me .  I have reviewed this patient's available data, including medical history, events of note, physical examination, and all test results as part of my evaluation.   CBC: Recent Labs  Lab 11/05/21 0142 11/06/21 0122 10/30/2021 0213 11/08/21 0431 11/08/21 1459 11/09/21 0500  WBC 3.6* 5.3 9.8 11.0* 10.8* 11.1*  NEUTROABS 2.6 4.2 8.7* 10.0*  --  10.2*  HGB 7.9* 7.5* 8.9* 9.0* 9.0* 8.0*  HCT 22.9* 21.5* 26.5* 25.8* 26.6* 23.6*  MCV 90.9 90.3 90.4 89.0 90.8 90.1  PLT 40* 45* 56* 66* 65* 70*   Basic Metabolic Panel: Recent Labs  Lab 11/05/21 0142 11/06/21 0122 11/03/2021 0213 11/08/21 0431 11/09/21 0500  NA 132* 129* 129* 130* 134*  K 3.0* 3.9 4.1 4.2 4.6  CL 104 103 102 103 107  CO2 18* 17* 17* 17* 19*  GLUCOSE 153* 201* 245* 222* 178*  BUN 50* 62* 81* 84* 71*  CREATININE 1.75* 2.39* 3.10* 2.73* 2.07*  CALCIUM 7.5* 7.4* 7.3* 7.5* 7.5*  MG 1.7 2.3 2.1 2.4 2.2  PHOS  --  1.9* 2.9 3.4 3.5   GFR: Estimated Creatinine Clearance: 39.8 mL/min (A) (by C-G formula based on SCr of 2.07 mg/dL (H)). Liver Function Tests: Recent Labs  Lab 11/05/21 0142 11/06/21 0122 10/22/2021 0213 11/08/21 0431 11/09/21 0500  AST 75* 69* 84* 56* 37  ALT 13 9 7  <5 <5  ALKPHOS 83 88 90 98 92  BILITOT 0.6 0.5 0.6 0.7 0.8  PROT 4.6* 4.6* 4.6* 4.7* 4.3*  ALBUMIN 2.0* 2.0* 1.9* 2.0* 1.7*   No results for input(s): LIPASE, AMYLASE in the last 168 hours. No results for input(s): AMMONIA in the last 168 hours. Coagulation Profile: Recent Labs  Lab 10/16/2021 1121  INR 1.6*   Cardiac Enzymes: No results for input(s): CKTOTAL, CKMB, CKMBINDEX, TROPONINI in the last 168 hours. BNP (last 3 results) No results for input(s): PROBNP in the last 8760 hours. HbA1C: No results for input(s): HGBA1C in the last 72 hours. CBG: Recent Labs  Lab 11/08/21 2012  11/08/21 2337 11/09/21 0334 11/09/21 0859 11/09/21 1154  GLUCAP 124* 148* 165* 189* 263*   Lipid Profile: No results for input(s): CHOL, HDL, LDLCALC, TRIG, CHOLHDL, LDLDIRECT in the last 72  hours. Thyroid Function Tests: No results for input(s): TSH, T4TOTAL, FREET4, T3FREE, THYROIDAB in the last 72 hours. Anemia Panel: No results for input(s): VITAMINB12, FOLATE, FERRITIN, TIBC, IRON, RETICCTPCT in the last 72 hours.  Urine analysis:    Component Value Date/Time   COLORURINE YELLOW (A) 10/31/2021 1707   APPEARANCEUR HAZY (A) 10/31/2021 1707   LABSPEC 1.017 10/31/2021 1707   PHURINE 5.0 10/31/2021 1707   GLUCOSEU 50 (A) 10/31/2021 1707   HGBUR SMALL (A) 10/31/2021 1707   BILIRUBINUR NEGATIVE 10/31/2021 1707   KETONESUR NEGATIVE 10/31/2021 1707   PROTEINUR 100 (A) 10/31/2021 1707   NITRITE NEGATIVE 10/31/2021 1707   LEUKOCYTESUR NEGATIVE 10/31/2021 1707   Sepsis Labs: @LABRCNTIP (procalcitonin:4,lacticidven:4)  ) Recent Results (from the past 240 hour(s))  Urine Culture     Status: Abnormal   Collection Time: 10/31/21 10:00 AM   Specimen: Urine, Random  Result Value Ref Range Status   Specimen Description   Final    URINE, RANDOM Performed at [facility], [address] [place], [place] [address]    Special Requests   Final    NONE Performed at [facility], [address], [place], [place] [address]    Culture (A)  Final    <10,000 COLONIES/mL INSIGNIFICANT GROWTH Performed at [facility], [place] [address], [place], [place] [address]    Report Status 11/02/2021 FINAL  Final  Resp Panel by RT-PCR (Flu A&B, Covid) Nasopharyngeal Swab     Status: None   Collection Time: 10/31/21 10:11 AM   Specimen: Nasopharyngeal Swab; Nasopharyngeal(NP) swabs in vial transport medium  Result Value Ref Range Status   SARS Coronavirus 2 by RT PCR NEGATIVE NEGATIVE Final    Comment: (NOTE) SARS-CoV-2 target nucleic acids are NOT DETECTED.  The  SARS-CoV-2 RNA is generally detectable in upper respiratory specimens during the acute phase of infection. The lowest concentration of SARS-CoV-2 viral copies this assay can detect is 138 copies/mL. A negative result does not preclude SARS-Cov-2 infection and should not be used as the sole basis for treatment or other patient management decisions. A negative result may occur with  improper specimen collection/handling, submission of specimen other than nasopharyngeal swab, presence of viral mutation(s) within the areas targeted by this assay, and inadequate number of viral copies(<138 copies/mL). A negative result must be combined with clinical observations, patient history, and epidemiological information. The expected result is Negative.  Fact Sheet for Patients:  EntrepreneurPulse.com.au  Fact Sheet for Healthcare Providers:  IncredibleEmployment.be  This test is no t yet approved or cleared by the [place] FDA and  has been authorized for detection and/or diagnosis of SARS-CoV-2 by FDA under an Emergency Use Authorization (EUA). This EUA will remain  in effect (meaning this test can be used) for the duration of the COVID-19 declaration under Section 564(b)(1) of the Act, 21 U.S.C.section 360bbb-3(b)(1), unless the authorization is terminated  or revoked sooner.       Influenza A by PCR NEGATIVE NEGATIVE Final   Influenza B by PCR NEGATIVE NEGATIVE Final    Comment: (NOTE) The Xpert Xpress SARS-CoV-2/FLU/RSV plus assay is intended as an aid in the diagnosis of influenza from Nasopharyngeal swab specimens and should not be used as a sole basis for treatment. Nasal washings and aspirates are unacceptable for Xpert Xpress SARS-CoV-2/FLU/RSV testing.  Fact Sheet for Patients: EntrepreneurPulse.com.au  Fact Sheet for Healthcare Providers: IncredibleEmployment.be  This test is not yet approved or  cleared by the [place] FDA and has been  authorized for detection and/or diagnosis of SARS-CoV-2 by FDA under an Emergency Use Authorization (EUA). This EUA will remain in effect (meaning this test can be used) for the duration of the COVID-19 declaration under Section 564(b)(1) of the Act, 21 U.S.C. section 360bbb-3(b)(1), unless the authorization is terminated or revoked.  Performed at [facility], [address] [place], [place] [address]   Blood Culture (routine x 2)     Status: Abnormal   Collection Time: 10/31/21 10:13 AM   Specimen: BLOOD  Result Value Ref Range Status   Specimen Description   Final    BLOOD RIGHT WRIST Performed at [facility], [address], [place], [place] [address]    Special Requests   Final    BOTTLES DRAWN AEROBIC AND ANAEROBIC Blood Culture adequate volume Performed at [facility], [address] [place], [place] [address]    Culture  Setup Time   Final    GRAM POSITIVE COCCI IN BOTH AEROBIC AND ANAEROBIC BOTTLES CRITICAL RESULT CALLED TO, READ BACK BY AND VERIFIED WITH: SHEEMA HALLAGI AT 1900 ON 10/31/21 BY SS    Culture (A)  Final    STAPHYLOCOCCUS AUREUS SUSCEPTIBILITIES PERFORMED ON PREVIOUS CULTURE WITHIN THE LAST 5 DAYS. Performed at [facility], [place] [address], [place], [place] [address]    Report Status 11/02/2021 FINAL  Final  Blood Culture (routine x 2)     Status: Abnormal   Collection Time: 10/31/21 10:13 AM   Specimen: BLOOD  Result Value Ref Range Status   Specimen Description   Final    BLOOD LEFT WRIST Performed at [facility], [address], [place], [place] [address]    Special Requests   Final    BOTTLES DRAWN AEROBIC AND ANAEROBIC BCAV Performed at [facility], [address], [place], [place] [address]    Culture  Setup Time   Final    GRAM POSITIVE COCCI IN BOTH AEROBIC AND ANAEROBIC BOTTLES CRITICAL VALUE NOTED.  VALUE IS CONSISTENT WITH  PREVIOUSLY REPORTED AND CALLED VALUE. Performed at [facility], [address] [place], [place] [address]    Culture (A)  Final    STAPHYLOCOCCUS AUREUS SUSCEPTIBILITIES PERFORMED ON PREVIOUS CULTURE WITHIN THE LAST 5 DAYS. Performed at [facility], [place] [address], [place], [place] [address]    Report Status 11/02/2021 FINAL  Final  Blood Culture ID Panel (Reflexed)     Status: Abnormal   Collection Time: 10/31/21 10:13 AM  Result Value Ref Range Status   Enterococcus faecalis NOT DETECTED NOT DETECTED Final   Enterococcus Faecium NOT DETECTED NOT DETECTED Final   Listeria monocytogenes NOT DETECTED NOT DETECTED Final   Staphylococcus species DETECTED (A) NOT DETECTED Final    Comment: CRITICAL RESULT CALLED TO, READ BACK BY AND VERIFIED WITH: SHEEMA HALLAGI AT 1900 ON 10/31/21 BY SS    Staphylococcus aureus (BCID) DETECTED (A) NOT DETECTED Final    Comment: CRITICAL RESULT CALLED TO, READ BACK BY AND VERIFIED WITH: SHEEMA HALLAGI AT 1900 ON 10/31/21 BY SS    Staphylococcus epidermidis NOT DETECTED NOT DETECTED Final   Staphylococcus lugdunensis NOT DETECTED NOT DETECTED Final   Streptococcus species NOT DETECTED NOT DETECTED Final   Streptococcus agalactiae NOT DETECTED NOT DETECTED Final   Streptococcus pneumoniae NOT DETECTED NOT DETECTED Final   Streptococcus pyogenes NOT DETECTED NOT DETECTED Final   A.calcoaceticus-baumannii NOT DETECTED NOT DETECTED Final   Bacteroides fragilis NOT DETECTED NOT DETECTED Final  Enterobacterales NOT DETECTED NOT DETECTED Final   Enterobacter cloacae complex NOT DETECTED NOT DETECTED Final   Escherichia coli NOT DETECTED NOT DETECTED Final   Klebsiella aerogenes NOT DETECTED NOT DETECTED Final   Klebsiella oxytoca NOT DETECTED NOT DETECTED Final   Klebsiella pneumoniae NOT DETECTED NOT DETECTED Final   Proteus species NOT DETECTED NOT DETECTED Final   Salmonella species NOT DETECTED NOT DETECTED Final   Serratia  marcescens NOT DETECTED NOT DETECTED Final   Haemophilus influenzae NOT DETECTED NOT DETECTED Final   Neisseria meningitidis NOT DETECTED NOT DETECTED Final   Pseudomonas aeruginosa NOT DETECTED NOT DETECTED Final   Stenotrophomonas maltophilia NOT DETECTED NOT DETECTED Final   Candida albicans NOT DETECTED NOT DETECTED Final   Candida auris NOT DETECTED NOT DETECTED Final   Candida glabrata NOT DETECTED NOT DETECTED Final   Candida krusei NOT DETECTED NOT DETECTED Final   Candida parapsilosis NOT DETECTED NOT DETECTED Final   Candida tropicalis NOT DETECTED NOT DETECTED Final   Cryptococcus neoformans/gattii NOT DETECTED NOT DETECTED Final   Meth resistant mecA/C and MREJ NOT DETECTED NOT DETECTED Final    Comment: Performed at [facility], [address] [place], [place] [address]  CULTURE, BLOOD (ROUTINE X 2) w Reflex to ID Panel     Status: None   Collection Time: 10/21/2021  3:42 AM   Specimen: BLOOD  Result Value Ref Range Status   Specimen Description BLOOD LEFT HAND  Final   Special Requests   Final    BOTTLES DRAWN AEROBIC AND ANAEROBIC Blood Culture results may not be optimal due to an excessive volume of blood received in culture bottles   Culture   Final    NO GROWTH 5 DAYS Performed at [facility], [address] [place], [place] [address]    Report Status 11/09/2021 FINAL  Final  CULTURE, BLOOD (ROUTINE X 2) w Reflex to ID Panel     Status: None   Collection Time: 11/06/2021  3:48 AM   Specimen: BLOOD  Result Value Ref Range Status   Specimen Description BLOOD LEFT HAND  Final   Special Requests   Final    BOTTLES DRAWN AEROBIC AND ANAEROBIC Blood Culture results may not be optimal due to an excessive volume of blood received in culture bottles   Culture   Final    NO GROWTH 5 DAYS Performed at [facility], [address], [place], [place] [address]    Report Status 11/09/2021 FINAL  Final  Surgical pcr screen     Status: Abnormal    Collection Time: 11/06/21 10:16 PM   Specimen: Nasal Mucosa; Nasal Swab  Result Value Ref Range Status   MRSA, PCR NEGATIVE NEGATIVE Final   Staphylococcus aureus POSITIVE (A) NEGATIVE Final    Comment: (NOTE) The Xpert SA Assay (FDA approved for NASAL specimens in patients 50 years of age and older), is one component of a comprehensive surveillance program. It is not intended to diagnose infection nor to guide or monitor treatment. Performed at [facility], [employer] [address], [place], [place] [address]   Culture, blood (Routine X 2) w Reflex to ID Panel     Status: None (Preliminary result)   Collection Time: 11/08/21  5:14 PM   Specimen: BLOOD  Result Value Ref Range Status   Specimen Description BLOOD LEFT ANTECUBITAL  Final   Special Requests   Final    BOTTLES DRAWN AEROBIC ONLY Blood Culture results may not be optimal due  to an inadequate volume of blood received in culture bottles   Culture   Final    NO GROWTH < 24 HOURS Performed at [facility] [address] [place], [place] [address]    Report Status PENDING  Incomplete  Culture, blood (Routine X 2) w Reflex to ID Panel     Status: None (Preliminary result)   Collection Time: 11/08/21  5:21 PM   Specimen: BLOOD LEFT HAND  Result Value Ref Range Status   Specimen Description BLOOD LEFT HAND  Final   Special Requests   Final    BOTTLES DRAWN AEROBIC AND ANAEROBIC Blood Culture results may not be optimal due to an inadequate volume of blood received in culture bottles   Culture   Final    NO GROWTH < 24 HOURS Performed at [facility], [place] [address], [place],  [address]    Report Status PENDING  Incomplete         Radiology Studies: CT HEAD WO CONTRAST (5MM)  Result Date: 11/09/2021 CLINICAL DATA:  Neuro deficit/stroke-like symptoms. EXAM: CT HEAD WITHOUT CONTRAST TECHNIQUE: Contiguous axial images were obtained from the base of the skull through the vertex without intravenous  contrast. COMPARISON:  10/17/2021 FINDINGS: Brain: Exam limited by patient motion. There is intracranial hemorrhage. Large area of parenchymal hemorrhage is noted posterior fossa involving both the right and left cerebellar hemispheres, asymmetrically involving the right greater than the left. Small foci cortical/subcortical hemorrhage are noted along the posterior left parietal lobe with adjacent hypoattenuation consistent with vasogenic edema. Probable tiny focus of subarachnoid hemorrhage is noted superior to this along the superior left parietal lobe. There is also intraventricular hemorrhage with hemorrhage noted in the atrium of the right lateral ventricle and in the fourth ventricle. There is interval enlargement of the lateral ventricles, most notably the atria and occipital horns and third ventricle, consistent with hydrocephalus. No parenchymal mass or convincing midline shift. There is no evidence of ischemic infarction. No extra-axial masses.  No evidence of subdural hemorrhage. Vascular: No hyperdense vessel or unexpected calcification. Skull: Left cochlear implant. Previous left mastoidectomy. No acute fracture or bone lesion. Sinuses/Orbits: Globes and orbits are unremarkable. Sinuses are essentially clear. Other: No convincing scalp hematoma/contusion. IMPRESSION: 1. Acute intracranial hemorrhage, which predominantly is parenchymal involving the cerebellar hemispheres, right greater than left, with smaller parenchymal hemorrhages in the cortical/subcortical region of the left parietal lobe. Trace left parietal lobe subarachnoid hemorrhage, right lateral ventricle and fourth ventricle intraventricular hemorrhage. Critical Value/emergent results were called by telephone at the time of interpretation on 11/09/2021 at 2:55 pm to the patient's nurse Lanelle Bal , who verbally acknowledged these results and is paging the Dr. in charge. 2. 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 3. No  findings to indicate recent trauma. The etiology of the intracranial hemorrhage is unclear. Electronically Signed   By: Lajean Manes M.D.   On: 11/09/2021 14:56        Scheduled Meds:  mupirocin ointment  1 application Nasal BID   sodium chloride flush  3 mL Intravenous Q12H   Continuous Infusions:  sodium chloride 10 mL/hr at 11/08/21 0045   chlorproMAZINE (THORAZINE) IV     fentaNYL infusion INTRAVENOUS 200 mcg/hr (11/09/21 1626)     LOS: 5 days   The patient is critically ill with multiple organ systems failure and requires high complexity decision making for assessment and support, frequent evaluation and titration of therapies, application of advanced monitoring technologies and extensive interpretation of  multiple databases. Critical Care Time devoted to patient care services described in this note  Time spent: 40 minutes     Rashod Gougeon, Geraldo Docker, MD [facility]   If 7PM-7AM, please contact night-coverage 11/09/2021, 5:25 PM

## 2021-11-10 ENCOUNTER — Encounter (HOSPITAL_COMMUNITY): Payer: Self-pay | Admitting: Internal Medicine

## 2021-11-10 NOTE — Progress Notes (Signed)
10/29/2021 12:53 AM Significant other, [name], has arrived to see pt, she only stayed a few minutes.  Will prepare pt to be transported to the morgue. [place]

## 2021-11-10 NOTE — Progress Notes (Signed)
11/09/21 2325 Notified [name] of pt's passing.  [name] will be coming up to see pt before we release to the morgue. [place]

## 2021-11-10 NOTE — Progress Notes (Signed)
11/09/2021 [place] of pt's passing.  All questions answered.  Spoke with Aberdeen Brigman-Ref#[id number]-Hold for tissue only. [place]

## 2021-11-10 NOTE — Progress Notes (Signed)
11/09/2021 2310 146mls of the Fentanyl 2563mcg in 245mls of NS wasted and verified by Corie Chiquito RN. [place]

## 2021-11-10 NOTE — Progress Notes (Signed)
11/09/2021 2300 Pt without BP, pulse and resp.  Verified by 2 RN.  Will notify MD and Family. [place]

## 2021-11-10 NOTE — Progress Notes (Signed)
11/09/2021 2320 Notified pt's daughter, Kirby Funk, of his passing.  Daughter asked nurse to notify pt's significant other [name].  Daughter and family are on there way from [place].   [place]

## 2021-11-12 NOTE — Addendum Note (Signed)
Addendum  created 11/12/21 0756 by Suzette Battiest, MD   Intraprocedure Staff edited

## 2021-11-13 LAB — CULTURE, BLOOD (ROUTINE X 2)
Culture: NO GROWTH
Culture: NO GROWTH

## 2021-11-13 NOTE — Death Summary Note (Signed)
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  Repeat if required.  If patient returns to previous baseline cognitively will know secondary to iatrogenic overdose of narcotics. - 11/27 if patient cognition continues to remain altered post Narcan obtain CT brain. -11/27 CT brain showed large hemorrhagic stroke see below.  Code stroke was called, stroke team arrived.  Discussed case with Dr. Su Monks.  Dr. Quinn Axe and I spoke with family.  Given severity of stroke and patient's previously expressed wishes to his fiance and daughter comfort care was decided upon. - 11/27 Family has decided on comfort care measures. -1/27 ADDENDUM patient expired peacefully at 67   MSSA bacteremia  - Admitted to [facility] on 10/[facility]/21 with 3rd episode of MSSA bacteremia in a yr, seen by ID, started on cefazolin, had no evidence for endocarditis on TEE 11/03/21, and was transferred to [facility] for pacemaker extraction  -  Blood cultures repeated 11-16-21  - Cardiology coordinating pacemaker extraction, continue cefazolin, will need long-term suppressive antibiotics per ID at [facility]   -11/23 spoke with card master patient is on their list to be seen today.  Explained to patient and wife will be up to cardiology for exact timing when they remove pacer given his bacteremia, and how long he will have to remain on antibiotics prior to them reinserting new pacer. - 11/24 per ID note from last night on 11/23 In the setting of retained TAVR will need suppressive antibiotics after completion of at least 6 weeks of IV cefazolin -11/25 s/p pacer removal with implantation of temporary pacer left chest wall - 11/27 Family has decided on comfort care measures.   Thrombocytopenia - Platelets 70 on admission, stable in 40-range for the 4 days leading up to transfer to [facility]  - Hx of intermittent thrombocytopenia going back at least a yr   -11/26 patient has bleeding or surgical site left chest wall. - 11/26 transfuse 1 unit platelets - Continue to hold Plavix and pharmacologic VTE ppx, continue daily CBC    Elevated transaminase -Resolving - Continue to hold statin   Sick sinus syndrome, s/p pacemaker -Plan extraction of pacemaker by cardiology - Per cardiology hemoglobin goal> 9 - Strict in and out -333.35ml - Daily weight  CAD - No anginal complaints  - Troponin was elevated to 200s on admission to [facility] on 11/18, seen by cardiology there, had preserved EF on echo, and was attributed to demand ishcemia in setting of bacteremia with hypotension, renal insufficiency, and anemia    AS s/p  TAVR  - No evidence for endocarditis on TEE 11/03/21  -See MSSA bacteremia   DM type II controlled with complication/DM Nephropathy  - A1c was 6.9% in November 2022  -Semglee 11 units daily - 11/25 increase resistant SSI -11/26 NovoLog 10 units QAC   Acute on CKD Stage IIIa (baseline Cr~1.59) - Monitor renal function closely -11/25  Foley placed yesterday secondary to urinary retention, sodium bicarb started today.  If renal function continues to deteriorate over the next several days will need to consult nephrology.  Expect that it should turn around. -11/26 improving on bicarb drip.   Acute urinary Retention - Place Foley catheter for comfort. - 11/24 increase Flomax 0.4 mg BID  Hypokalemia -Potassium goal> 4   Hypomagnesmia - Magnesium goal> 2   Hypophosphatemia - Phosphorus goal> 2.5 - Sodium phosphate IV 40 mmol   Hypocalcemia - 11/24 corrected calcium= 9.0   Hyponatremia - 11/25 with patient's increasing acute renal failure, and decreasing HCO3 see normal saline -11/25 bicarb drip 29ml/hr   Goals of care - 11/27 Family has decided on comfort care measures secondary to severe hemorrhagic stroke.   History of present illness:  Nathan Singleton is a pleasant 80 y.o. WM PMHx coronary artery disease, SSS with pacemaker, AS s/p TAVR,insulin-dependent diabetes mellitus, CKD stage IIIa,  and 2 episodes of MSSA bacteremia earlier this year attributed to right foot infection and managed with second toe amputation and 6 weeks IV cefazolin, now with his third episode of MSSA bacteremia and transferred from [facility] for pacemaker extraction.    [facility] Hospital Course: Patient was seen in the [facility] ED on 10/30/2021 with fevers, had blood cultures collected, and went home with Keflex.  He continued to have fever to 103 F at home that night, the cultures started growing gram-positive cocci, and he came back into the [facility] ED the following day where he had fluid responsive hypotension and-80-year-old was admitted (11/18 [facility]).   ID provided consultation for the recurrent bacteremia which ended up being MSSA again, patient underwent TEE on 11/03/2021 with no evidence for endocarditis.  He was continued on IV cefazolin, transferred to [facility] for extraction of pacemaker per ID recommendation, and will need long-term  suppressive therapy in light of his prosthetic AV.   He appears to have chronic intermittent thrombocytopenia, platelet count 70 on admission, then decreased to the 40 range where it remained stable for the 4 days leading up to transfer.  Plavix and pharmacologic VTE prophylaxis were held for this reason and he was continued on baby aspirin.   Troponin was elevated to the 200s on admission, he was evaluated by cardiology, had preserved EF on echo, and this was attributed to demand ischemia in the setting of sepsis with hypotension and renal insufficiency.   Transaminases were elevated during the admission with normal alkaline phosphatase and normal bilirubin.  Statin was held and transaminases have been trending down.   He developed loose stools without abdominal pain or leukocytosis. This was felt to be related to antibiotics, laxatives were held, and probiotic started.   Hospital Course:  See above   Time: 2300  Signed:  Dia Crawford, MD [facility]

## 2021-11-13 DEATH — deceased

## 2021-11-25 IMAGING — CR DG CHEST 2V
2 series · 2 of 2 positions shown · non-contrast
Comparison: 11/15/2020

CLINICAL DATA: Chest pain today, history coronary artery disease
and TAVR

EXAM:
CHEST - 2 VIEW

[chest lat]
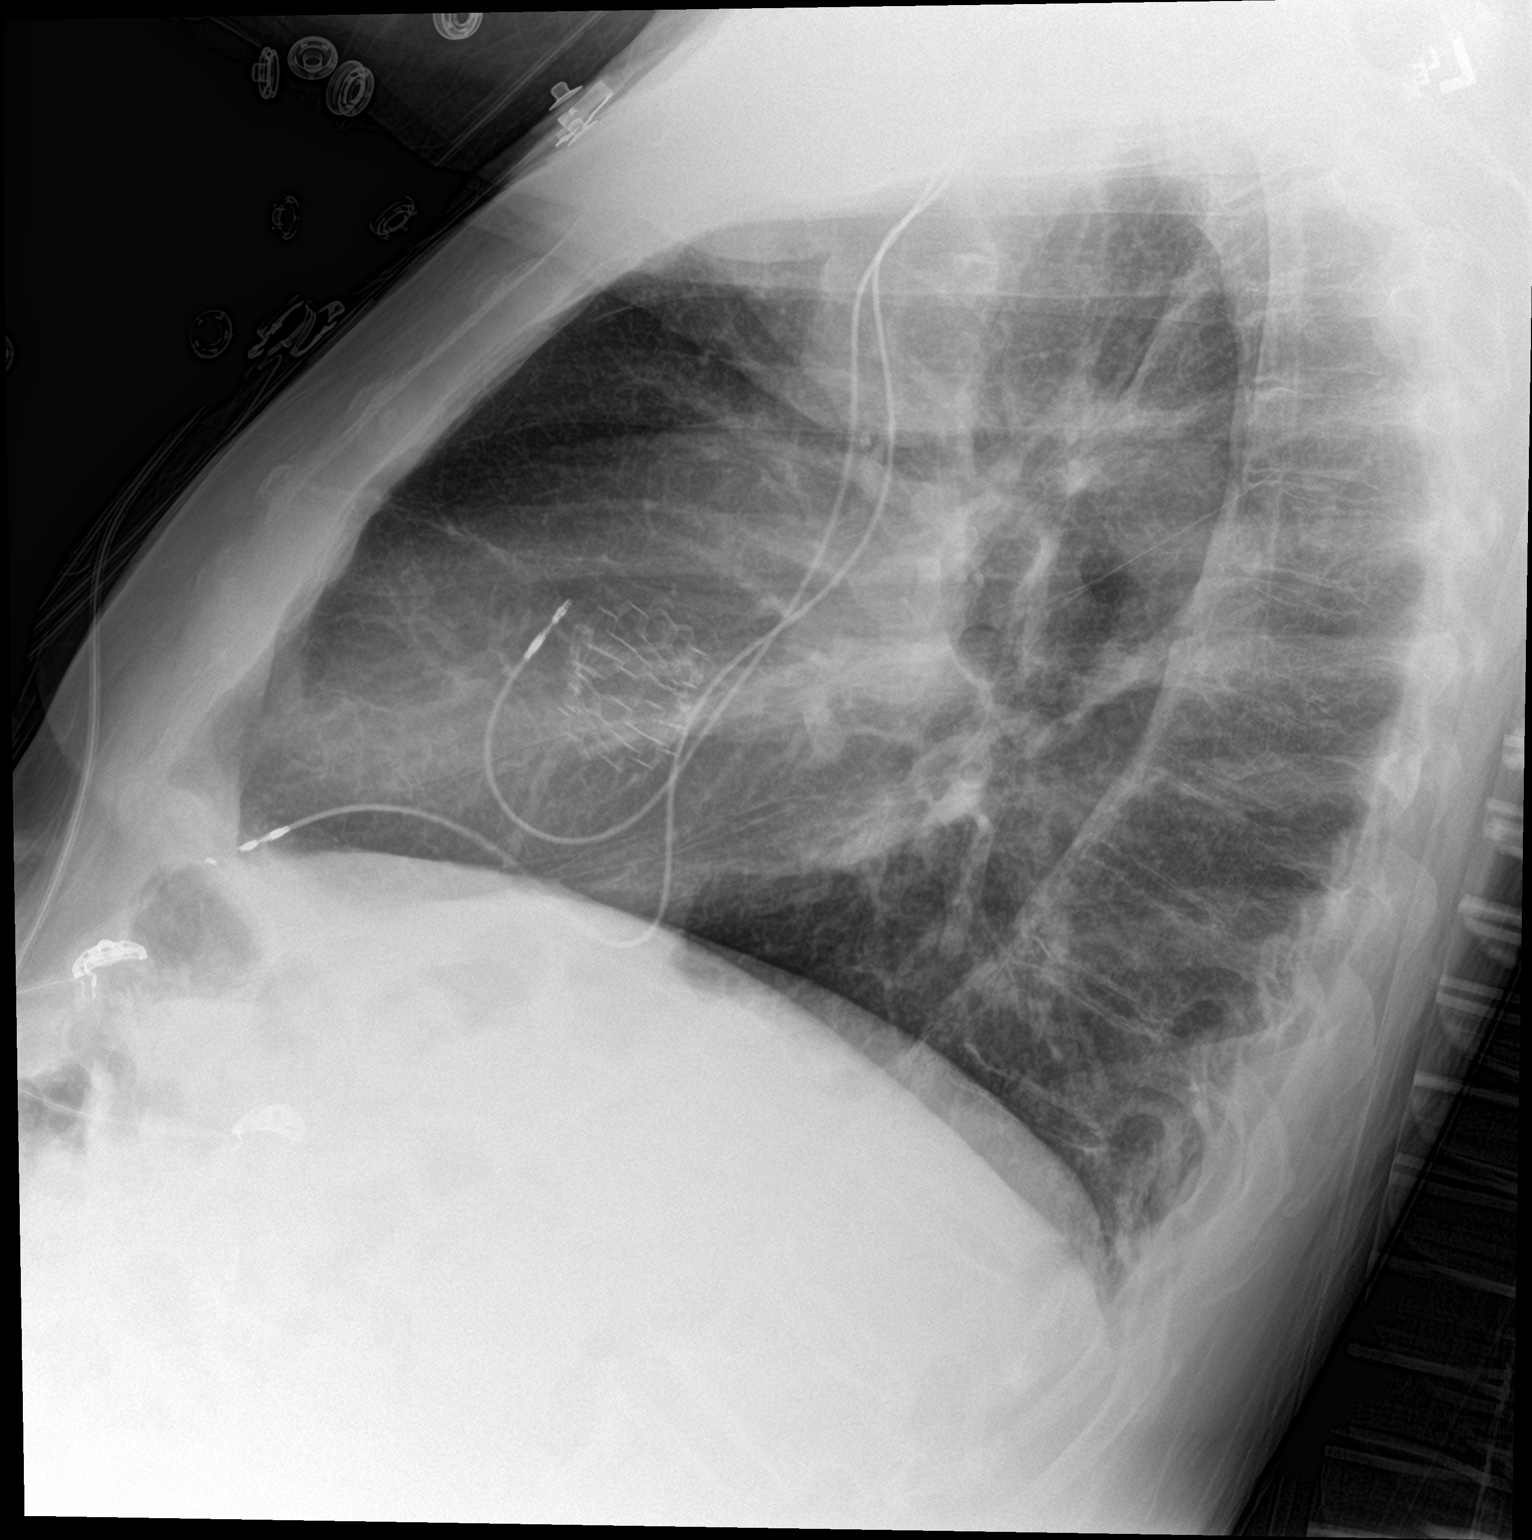

[chest ap]
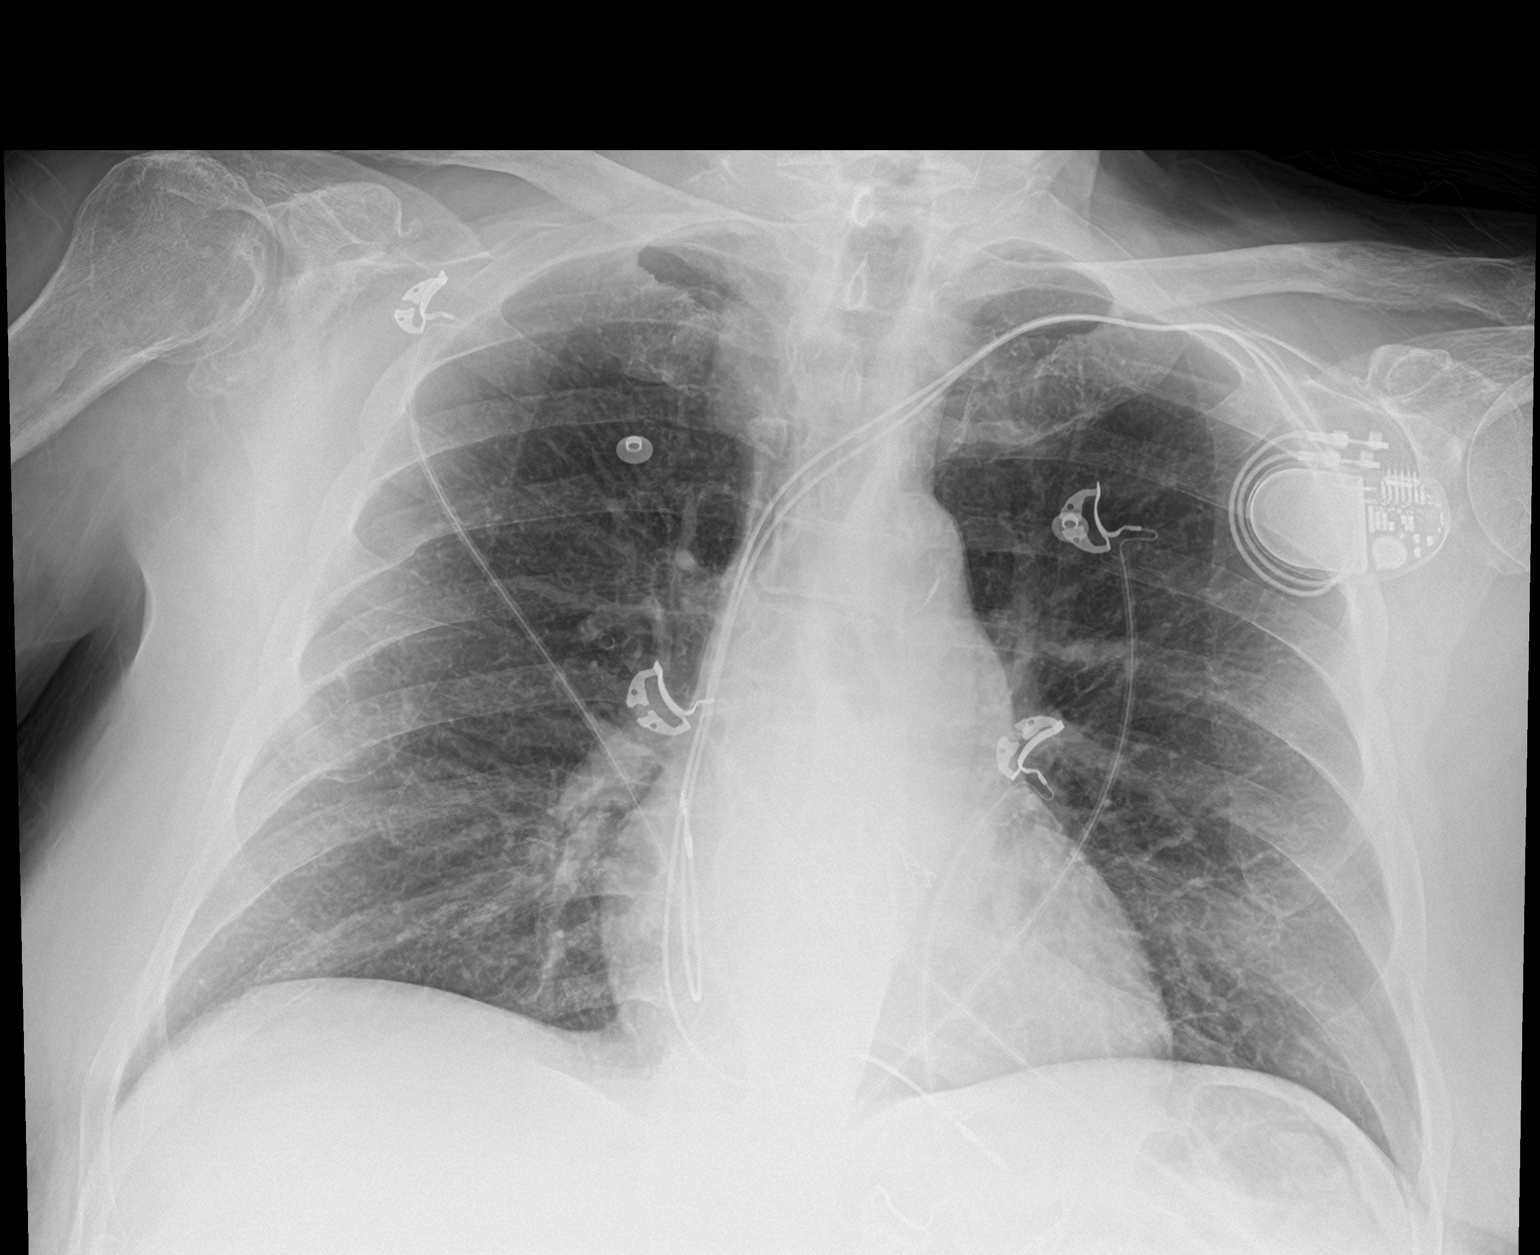

[2 of 2 positions shown; findings below may reference images not displayed]

FINDINGS: LEFT subclavian transvenous pacemaker with leads projecting at RIGHT
atrium and RIGHT ventricle.

Normal heart size post TAVR.

Mediastinal contours and pulmonary vascularity normal.

Atherosclerotic calcification aorta.

Mild chronic bronchitic changes without pulmonary infiltrate,
pleural effusion or pneumothorax.

Bones demineralized.
IMPRESSION: No acute abnormalities.

Post TAVR and pacemaker.

## 2022-01-06 ENCOUNTER — Ambulatory Visit (INDEPENDENT_AMBULATORY_CARE_PROVIDER_SITE_OTHER): Payer: 59 | Admitting: Vascular Surgery

## 2022-01-06 ENCOUNTER — Encounter (INDEPENDENT_AMBULATORY_CARE_PROVIDER_SITE_OTHER): Payer: 59

## 2022-06-05 IMAGING — CR DG FOOT COMPLETE 3+V*R*
1 series · 3 of 3 positions shown · non-contrast
Comparison: 05/07/2021

CLINICAL DATA: Right foot infection

EXAM:
RIGHT FOOT COMPLETE - 3+ VIEW

[Series 1: x foot ap right · 0.14mm/px · 3 of 3 slices shown]
[im 1/3]
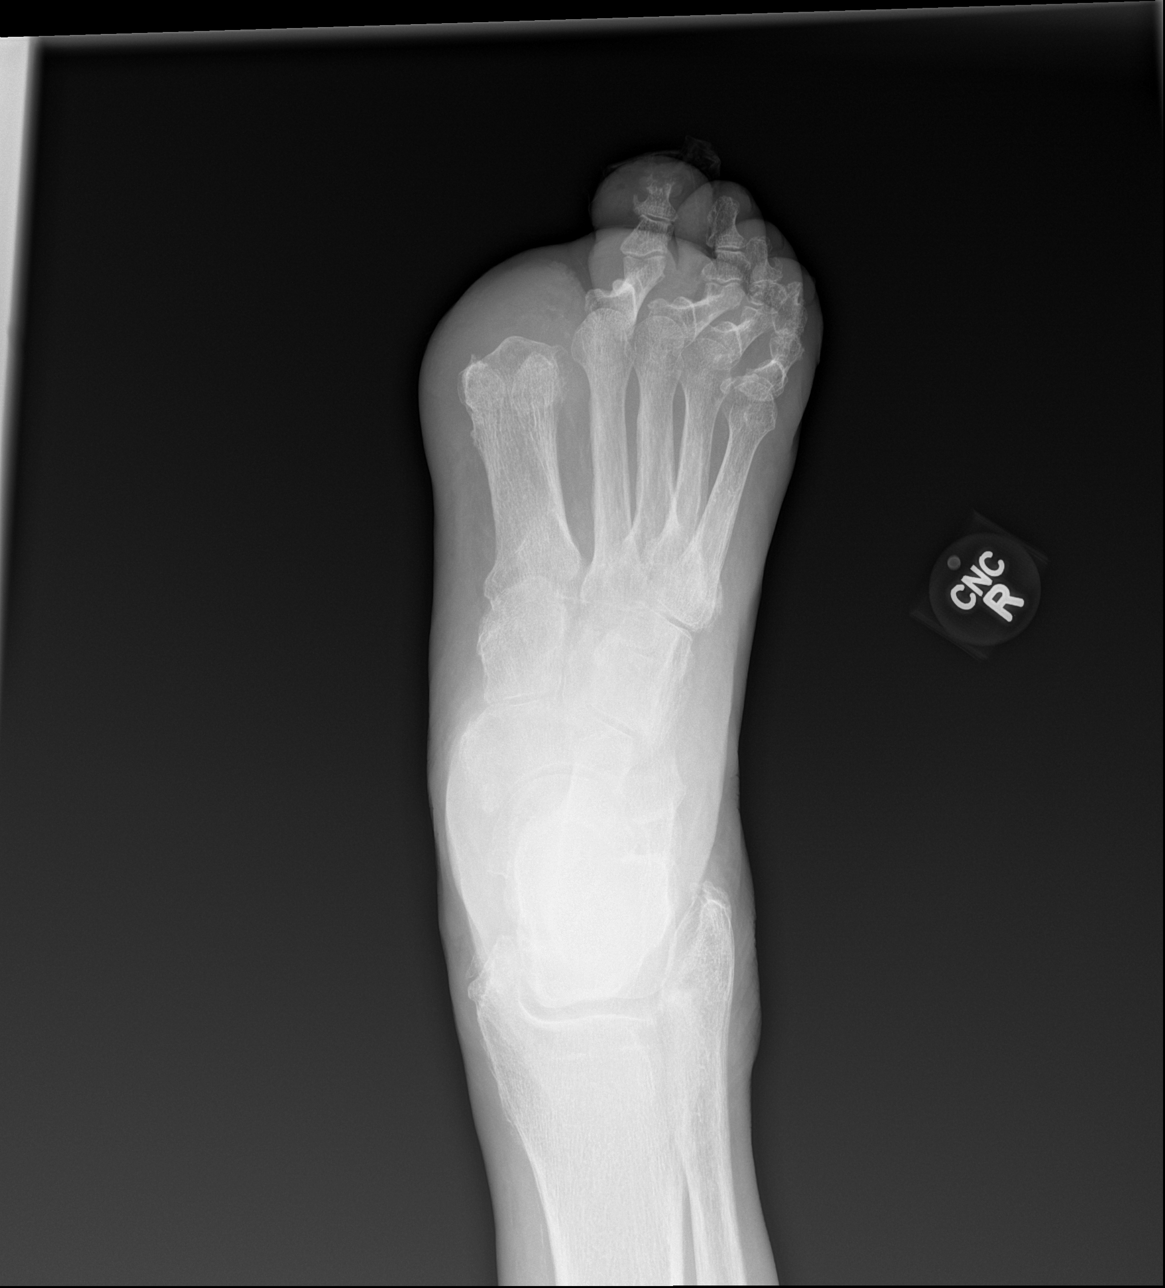
[im 2/3]
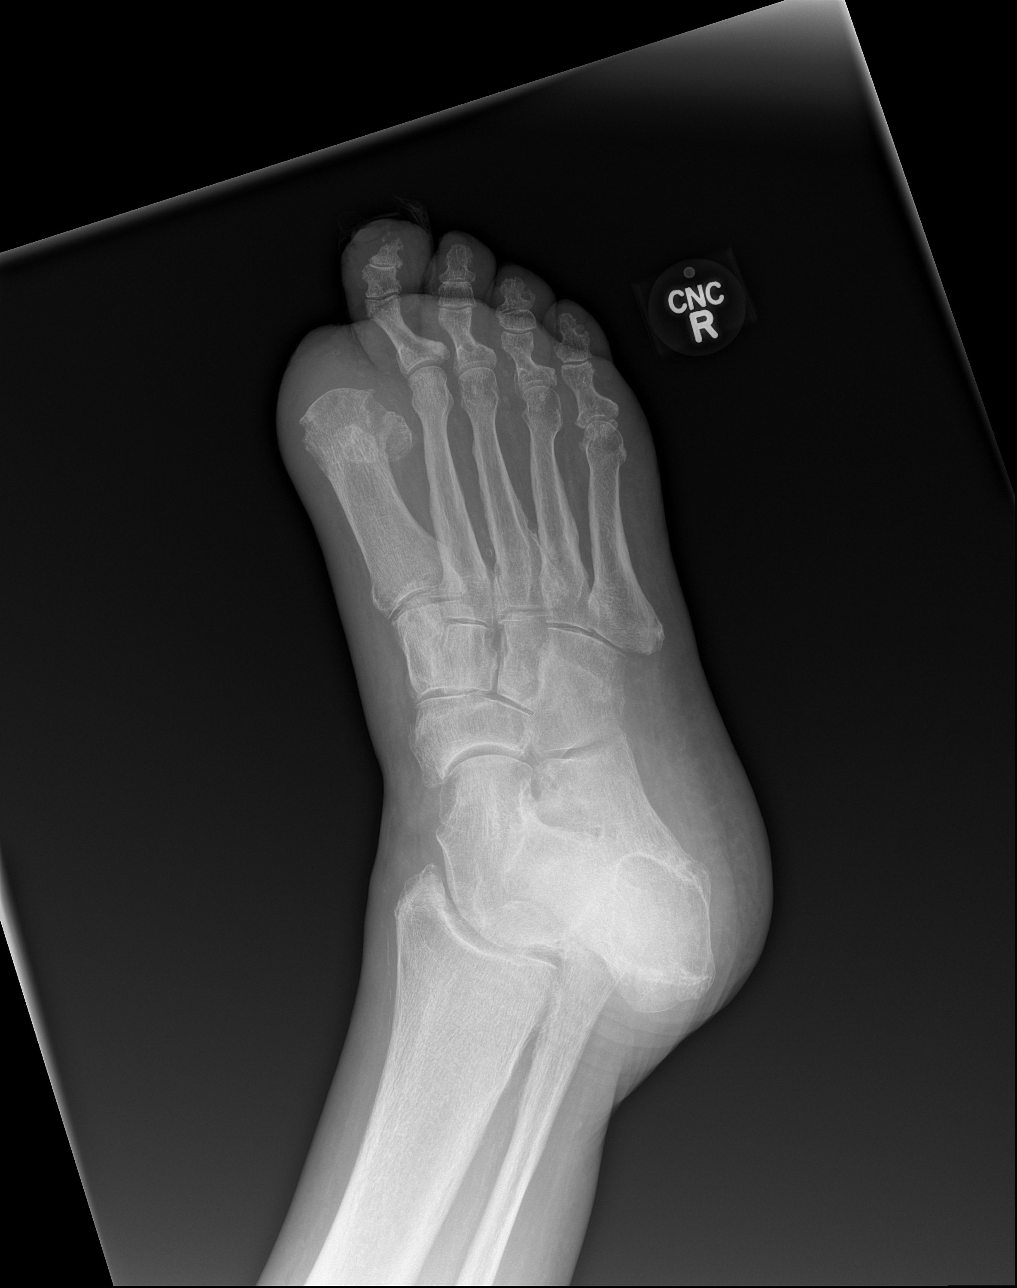
[im 3/3]
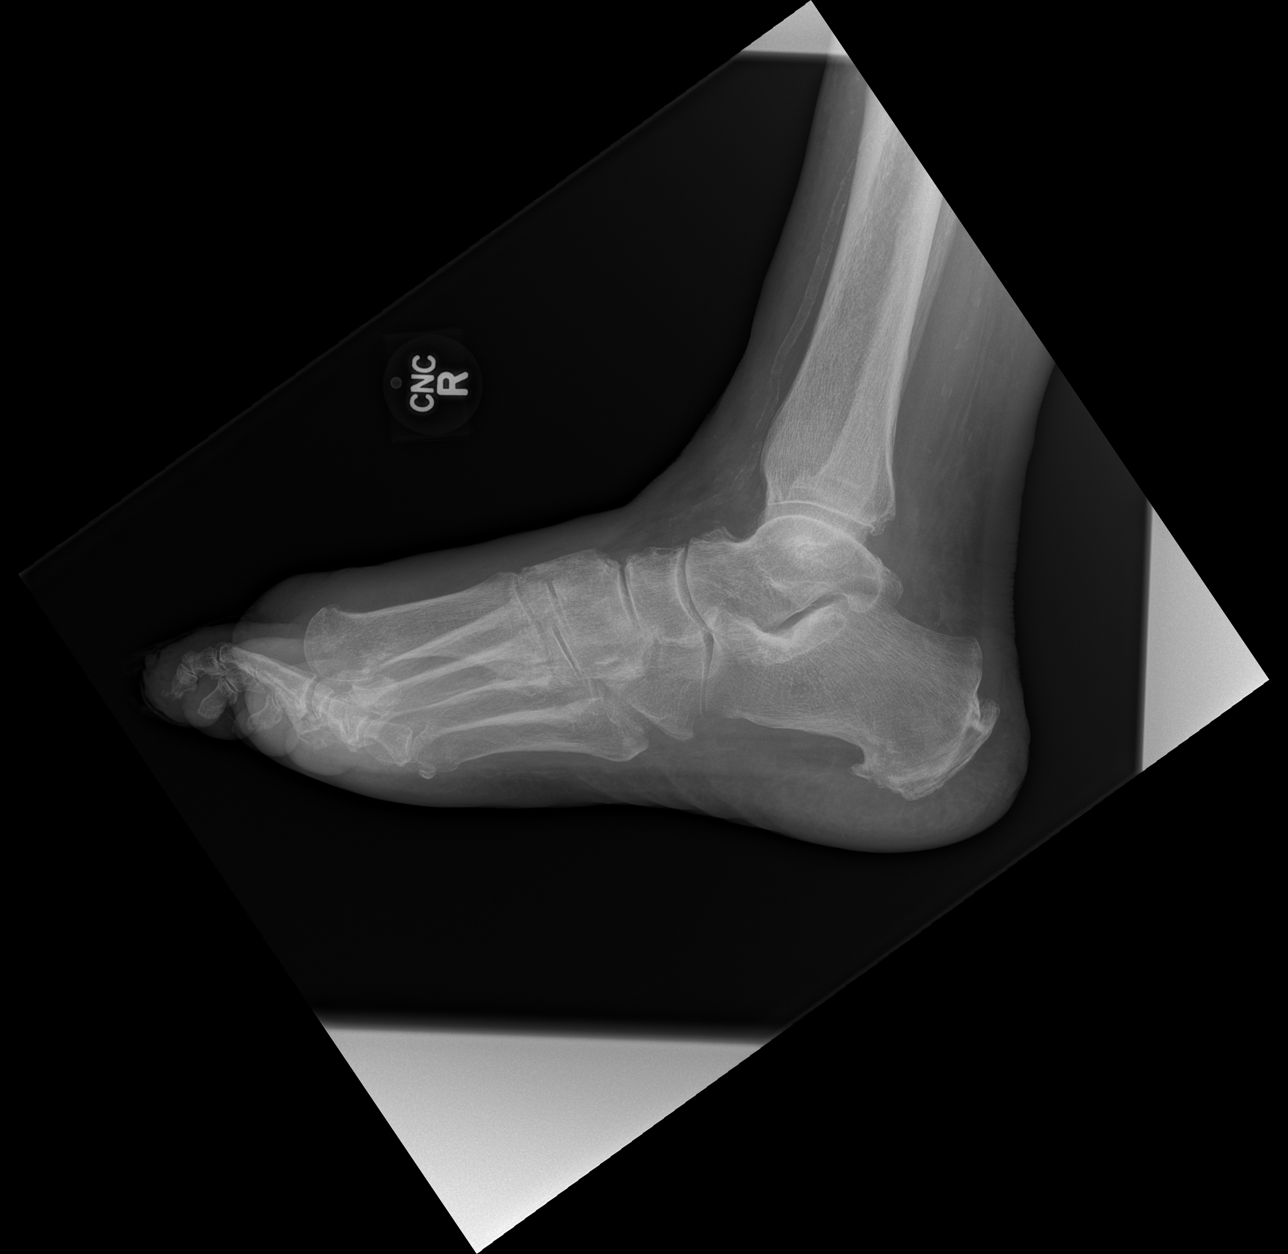

[3 of 3 positions shown; findings below may reference images not displayed]

FINDINGS: New erosive changes of the distal tuft of the right second toe
distal phalanx compatible with acute osteomyelitis. Diffuse soft
tissue swelling of the second toe including a few tiny foci of soft
tissue air at the level of the distal tuft, which may reflect
ulceration or wound. No soft tissue gas is seen tracking proximally
within the foot. Prior great toe amputation at the first MTP joint.
No fracture or dislocation. No additional sites of bone destruction.
Mild diffuse soft tissue swelling of the foot. Advanced vascular
calcifications.
IMPRESSION: 1. Acute osteomyelitis of the distal phalanx of the right second
toe.
2. Diffuse soft tissue swelling of the second toe including a few
tiny foci of soft tissue air at the level of the distal tuft, which
may reflect ulceration or wound.

## 2022-07-29 IMAGING — DX DG FOOT COMPLETE 3+V*R*
3 series · 3 of 3 positions shown · non-contrast
Comparison: 06/01/2021

CLINICAL DATA: Previous amputation.  Swelling of the third toe.

EXAM:
RIGHT FOOT COMPLETE - 3+ VIEW

[foot ap]
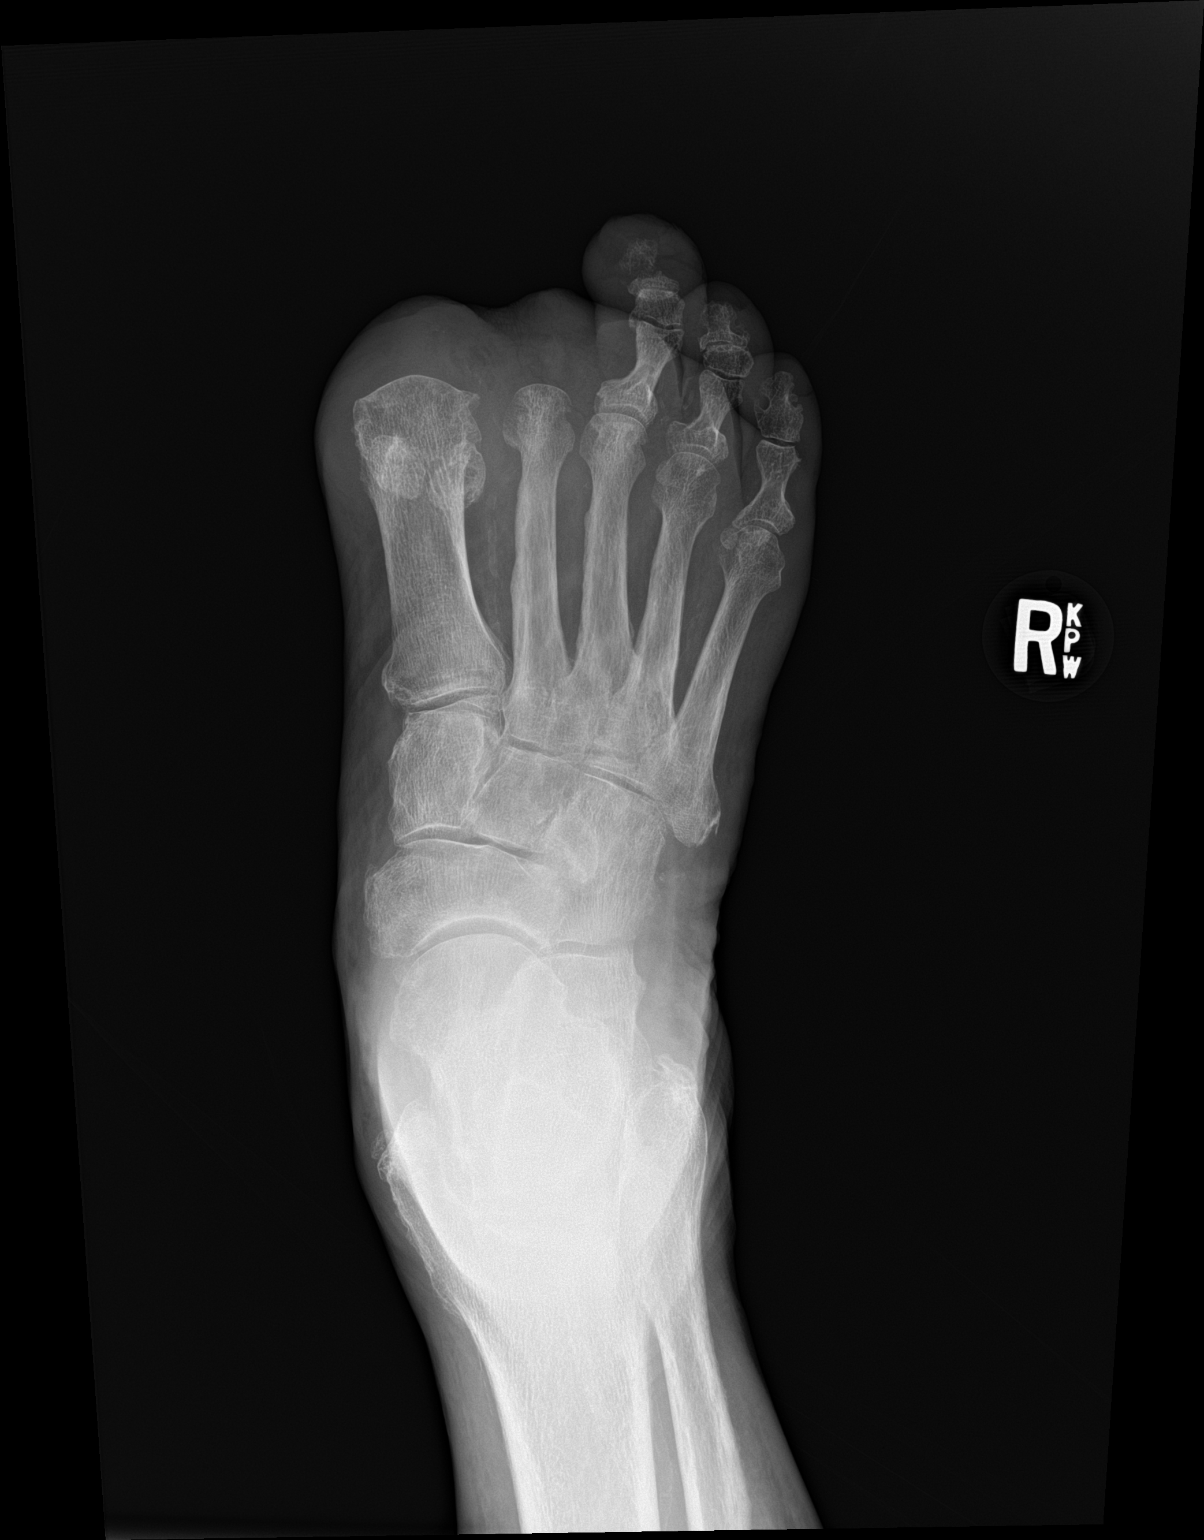

[foot obl]
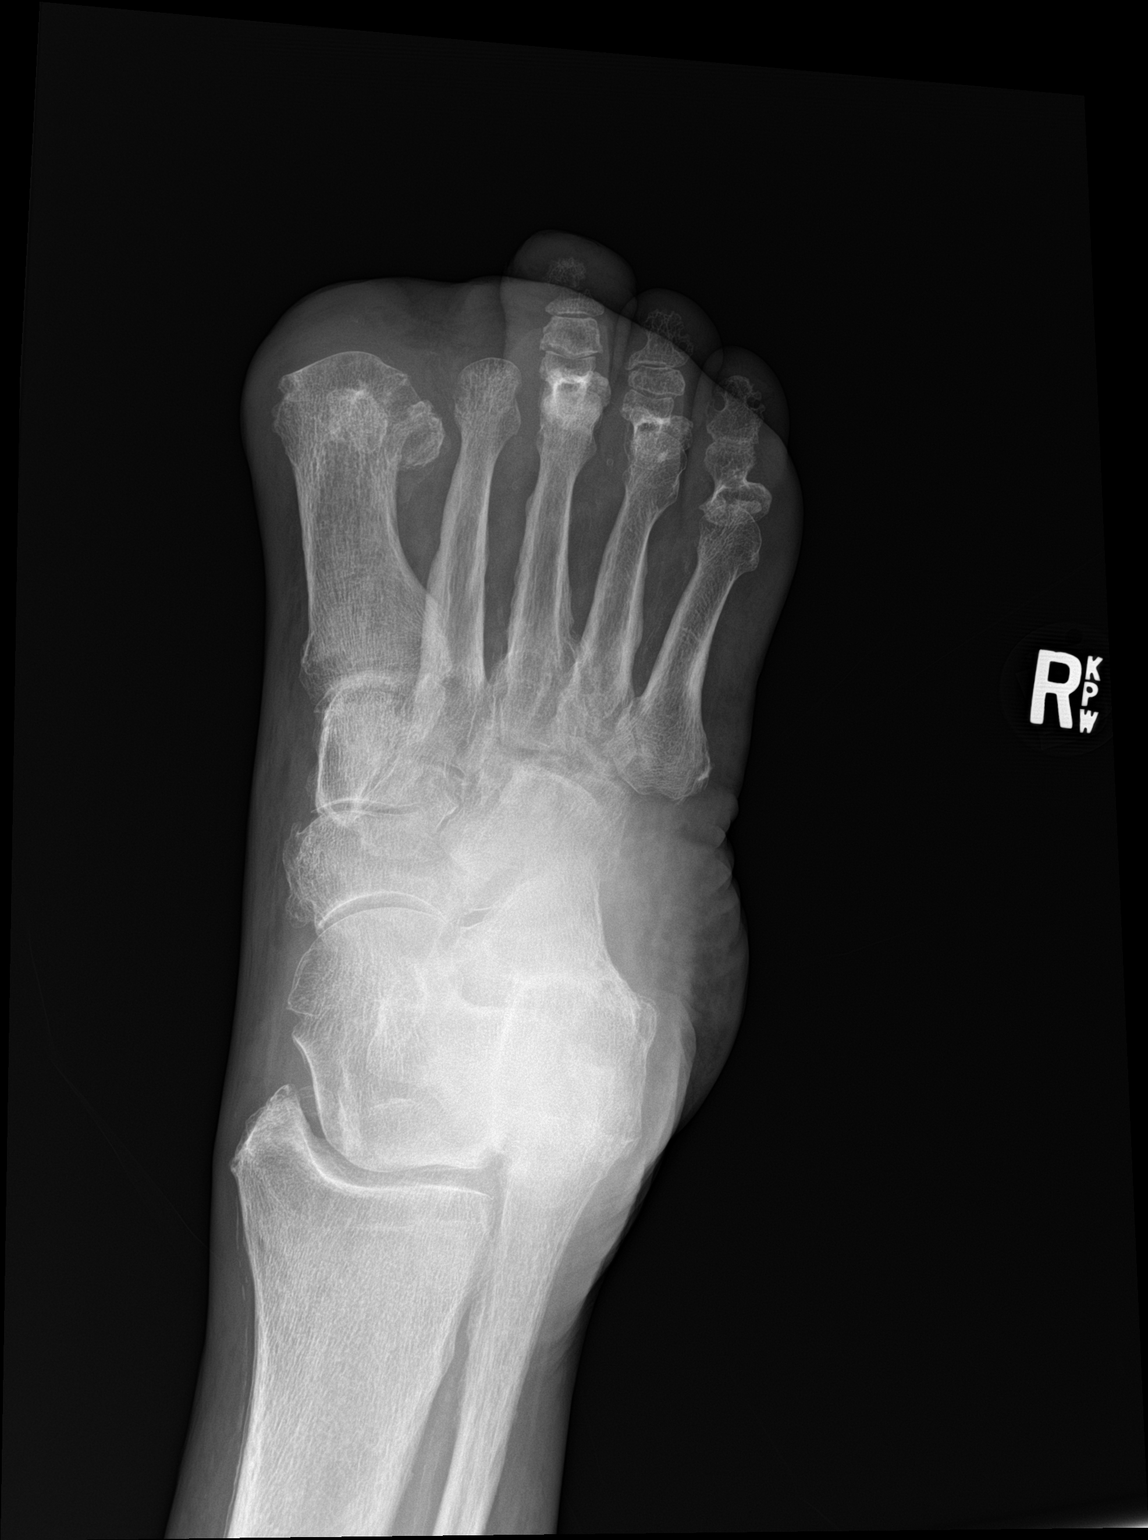

[foot lat]
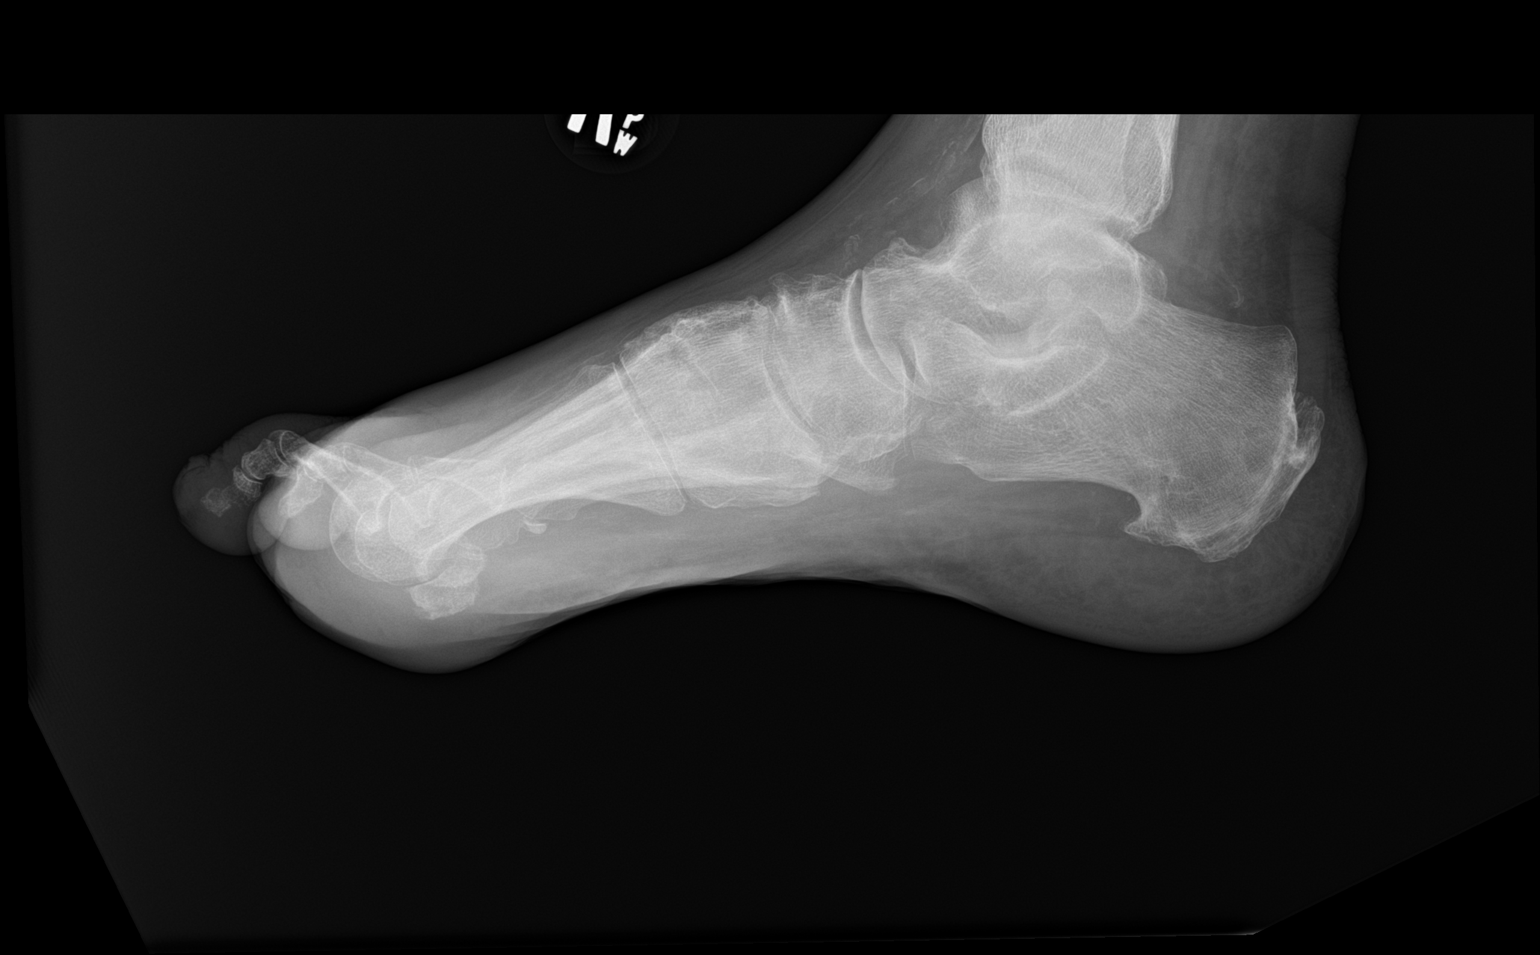

[3 of 3 positions shown; findings below may reference images not displayed]

FINDINGS: Previous amputation of the great toe and second toe at the MTP joint
level. Patient now shows lytic destruction of the distal phalanx of
the third toe consistent with osteomyelitis. Evidence of bone
infection elsewhere. Ordinary degenerative changes present in the
midfoot. Ordinary calcaneal spurs. Arterial calcification is noted.
IMPRESSION: Previous amputation of the great toe and second toe at the MTP
joints.

Newly seen lytic destruction of the distal phalanx the third toe
consistent with osteomyelitis.
